# Patient Record
Sex: Male | Born: 1960 | Race: Black or African American | Hispanic: No | Marital: Single | Smoking: Current every day smoker
Health system: Southern US, Community
[De-identification: ages and names within clinical notes are randomized; demographics above are authoritative.]

## PROBLEM LIST (undated history)

## (undated) DIAGNOSIS — Z9889 Other specified postprocedural states: Secondary | ICD-10-CM

## (undated) DIAGNOSIS — I4891 Unspecified atrial fibrillation: Secondary | ICD-10-CM

## (undated) DIAGNOSIS — Z87442 Personal history of urinary calculi: Secondary | ICD-10-CM

## (undated) DIAGNOSIS — D649 Anemia, unspecified: Secondary | ICD-10-CM

## (undated) DIAGNOSIS — G4730 Sleep apnea, unspecified: Secondary | ICD-10-CM

## (undated) DIAGNOSIS — T783XXA Angioneurotic edema, initial encounter: Secondary | ICD-10-CM

## (undated) DIAGNOSIS — Z720 Tobacco use: Secondary | ICD-10-CM

## (undated) DIAGNOSIS — J449 Chronic obstructive pulmonary disease, unspecified: Secondary | ICD-10-CM

## (undated) DIAGNOSIS — M1990 Unspecified osteoarthritis, unspecified site: Secondary | ICD-10-CM

## (undated) DIAGNOSIS — Z9981 Dependence on supplemental oxygen: Secondary | ICD-10-CM

## (undated) DIAGNOSIS — R0600 Dyspnea, unspecified: Secondary | ICD-10-CM

## (undated) DIAGNOSIS — I10 Essential (primary) hypertension: Secondary | ICD-10-CM

## (undated) DIAGNOSIS — I4892 Unspecified atrial flutter: Secondary | ICD-10-CM

## (undated) DIAGNOSIS — I499 Cardiac arrhythmia, unspecified: Secondary | ICD-10-CM

## (undated) DIAGNOSIS — G4733 Obstructive sleep apnea (adult) (pediatric): Secondary | ICD-10-CM

## (undated) DIAGNOSIS — R7303 Prediabetes: Secondary | ICD-10-CM

## (undated) DIAGNOSIS — B182 Chronic viral hepatitis C: Secondary | ICD-10-CM

## (undated) DIAGNOSIS — I5032 Chronic diastolic (congestive) heart failure: Secondary | ICD-10-CM

## (undated) HISTORY — DX: Tobacco use: Z72.0

## (undated) HISTORY — DX: Sleep apnea, unspecified: G47.30

## (undated) HISTORY — DX: Chronic diastolic (congestive) heart failure: I50.32

## (undated) HISTORY — DX: Obstructive sleep apnea (adult) (pediatric): G47.33

## (undated) HISTORY — DX: Unspecified atrial fibrillation: I48.91

## (undated) HISTORY — PX: NO PAST SURGERIES: SHX2092

## (undated) HISTORY — DX: Other specified postprocedural states: Z98.890

## (undated) HISTORY — DX: Angioneurotic edema, initial encounter: T78.3XXA

## (undated) HISTORY — DX: Chronic viral hepatitis C: B18.2

## (undated) HISTORY — DX: Unspecified atrial flutter: I48.92

---

## 1997-08-23 ENCOUNTER — Emergency Department (HOSPITAL_COMMUNITY): Admission: EM | Admit: 1997-08-23 | Discharge: 1997-08-23 | Payer: Self-pay | Admitting: *Deleted

## 2009-10-09 ENCOUNTER — Emergency Department (HOSPITAL_COMMUNITY): Admission: EM | Admit: 2009-10-09 | Discharge: 2009-10-09 | Payer: Self-pay | Admitting: Emergency Medicine

## 2011-09-22 ENCOUNTER — Emergency Department (HOSPITAL_COMMUNITY)
Admission: EM | Admit: 2011-09-22 | Discharge: 2011-09-22 | Disposition: A | Discharge status: Home / Self Care | Payer: Self-pay | Attending: Emergency Medicine | Admitting: Emergency Medicine

## 2011-09-22 ENCOUNTER — Encounter (HOSPITAL_COMMUNITY): Payer: Self-pay | Admitting: *Deleted

## 2011-09-22 DIAGNOSIS — L02419 Cutaneous abscess of limb, unspecified: Secondary | ICD-10-CM

## 2011-09-22 DIAGNOSIS — L03119 Cellulitis of unspecified part of limb: Secondary | ICD-10-CM

## 2011-09-22 DIAGNOSIS — IMO0002 Reserved for concepts with insufficient information to code with codable children: Secondary | ICD-10-CM | POA: Insufficient documentation

## 2011-09-22 LAB — CBC WITH DIFFERENTIAL/PLATELET
Basophils Absolute: 0 10*3/uL (ref 0.0–0.1)
Basophils Relative: 0 % (ref 0–1)
Eosinophils Absolute: 0.1 10*3/uL (ref 0.0–0.7)
Eosinophils Relative: 1 % (ref 0–5)
HCT: 36.7 % — ABNORMAL LOW (ref 39.0–52.0)
Hemoglobin: 12.6 g/dL — ABNORMAL LOW (ref 13.0–17.0)
Lymphocytes Relative: 30 % (ref 12–46)
Lymphs Abs: 1.6 10*3/uL (ref 0.7–4.0)
MCH: 32.4 pg (ref 26.0–34.0)
MCHC: 34.3 g/dL (ref 30.0–36.0)
MCV: 94.3 fL (ref 78.0–100.0)
Monocytes Absolute: 0.5 10*3/uL (ref 0.1–1.0)
Monocytes Relative: 10 % (ref 3–12)
Neutro Abs: 3 10*3/uL (ref 1.7–7.7)
Neutrophils Relative %: 58 % (ref 43–77)
Platelets: 138 10*3/uL — ABNORMAL LOW (ref 150–400)
RBC: 3.89 MIL/uL — ABNORMAL LOW (ref 4.22–5.81)
RDW: 12.9 % (ref 11.5–15.5)
WBC: 5.2 10*3/uL (ref 4.0–10.5)

## 2011-09-22 LAB — BASIC METABOLIC PANEL
BUN: 15 mg/dL (ref 6–23)
CO2: 25 mEq/L (ref 19–32)
Calcium: 8.6 mg/dL (ref 8.4–10.5)
Chloride: 102 mEq/L (ref 96–112)
Creatinine, Ser: 0.79 mg/dL (ref 0.50–1.35)
GFR calc Af Amer: >90 mL/min (ref >90)
GFR calc non Af Amer: >90 mL/min (ref >90)
Glucose, Bld: 95 mg/dL (ref 70–99)
Potassium: 3.6 mEq/L (ref 3.5–5.1)
Sodium: 135 mEq/L (ref 135–145)

## 2011-09-22 MED ORDER — CLINDAMYCIN PHOSPHATE 900 MG/50ML IV SOLN
900.0000 mg | Freq: Once | INTRAVENOUS | Status: AC
  Administered 2011-09-22: 900 mg via INTRAVENOUS
  Filled 2011-09-22: qty 50

## 2011-09-22 MED ORDER — CLINDAMYCIN HCL 150 MG PO CAPS: ORAL_CAPSULE | ORAL | Status: DC

## 2011-09-22 NOTE — ED Notes (Signed)
Pt states understanding of discharge instructions 

## 2011-09-22 NOTE — ED Notes (Signed)
Pt reports possible insect bite to right forearm, has increased in size since this am. Redness and warmth noted to forearm.

## 2011-09-22 NOTE — ED Provider Notes (Signed)
Medical screening examination/treatment/procedure(s) were conducted as a shared visit with non-physician practitioner(s) and myself.  I personally evaluated the patient during the encounter   Pt with cellulitis of R forearm from insect bite, no abscess, no fever. No known PMH. IV Abx and reassess.   Naliya Gish B. Bernette Mayers, MD 09/22/11 [phone]

## 2011-09-24 ENCOUNTER — Emergency Department (HOSPITAL_COMMUNITY)
Admission: EM | Admit: 2011-09-24 | Discharge: 2011-09-24 | Disposition: A | Discharge status: Home / Self Care | Payer: Self-pay | Attending: Emergency Medicine | Admitting: Emergency Medicine

## 2011-09-24 DIAGNOSIS — F17200 Nicotine dependence, unspecified, uncomplicated: Secondary | ICD-10-CM | POA: Insufficient documentation

## 2011-09-24 DIAGNOSIS — L02413 Cutaneous abscess of right upper limb: Secondary | ICD-10-CM

## 2011-09-24 DIAGNOSIS — IMO0002 Reserved for concepts with insufficient information to code with codable children: Secondary | ICD-10-CM | POA: Insufficient documentation

## 2011-09-24 MED ORDER — HYDROCODONE-ACETAMINOPHEN 5-325 MG PO TABS: 1.0000 | ORAL_TABLET | ORAL | Status: AC | PRN

## 2011-09-24 NOTE — ED Notes (Signed)
Follow-up on a I and D of a cyst from the rt. Posterior forearm.

## 2011-09-24 NOTE — ED Provider Notes (Signed)
History     CSN: [id number]  Arrival date & time 09/24/11  4098   First MD Initiated Contact with Patient 09/24/11 [phone]      Chief Complaint  Patient presents with  . Wound Check    (Consider location/radiation/quality/duration/timing/severity/associated sxs/prior treatment) Patient is a 51 y.o. male presenting with wound check. The history is provided by the patient.  Wound Check  He was treated in the ED 2 to 3 days ago. Treatments since wound repair include oral antibiotics (Abscess to right arm started on abx 2 days ago but not drained, returns for recheck. ). There has been no drainage from the wound. The redness has worsened. The swelling has not changed. The pain has not changed. He has no difficulty moving the affected extremity or digit.    No past medical history on file.  No past surgical history on file.  No family history on file.  History  Substance Use Topics  . Smoking status: Current Everyday Smoker    Types: Cigarettes  . Smokeless tobacco: Not on file  . Alcohol Use: Yes     occ      Review of Systems  Constitutional: Negative for fever.  Musculoskeletal: Negative for myalgias and joint swelling.  Skin:       See HPI.    Allergies  Review of patient's allergies indicates no known allergies.  Home Medications   Current Outpatient Rx  Name Route Sig Dispense Refill  . CLINDAMYCIN HCL 150 MG PO CAPS Oral Take 450 mg by mouth 3 (three) times daily. 10 days supply given  for infection      BP 152/80  Pulse 92  Resp 20  SpO2 100%  Physical Exam  Constitutional: He is oriented to person, place, and time. He appears well-developed and well-nourished. No distress.  Musculoskeletal: Normal range of motion.  Neurological: He is alert and oriented to person, place, and time.  Skin:       4cm x 3 cm red area to posterior proximal forearm with central blister that has central necrosis, c/w abscess. No active drainage.     ED Course  Procedures  (including critical care time)  Labs Reviewed - No data to display No results found.  INCISION AND DRAINAGE Performed by: Langley Adie A Consent: Verbal consent obtained. Risks and benefits: risks, benefits and alternatives were discussed Type: abscess  Body area: right prox. forearm  Anesthesia: local infiltration  Local anesthetic: lidocaine 1% w/o epinephrine  Anesthetic total: 1 ml  Complexity: complex Blunt dissection to break up loculations  Drainage: purulent  Drainage amount: small  Packing material: 1/4 in iodoform gauze  Patient tolerance: Patient tolerated the procedure well with no immediate complications.   No diagnosis found.  1. Abscess   MDM  Abscess previously evaluated 2 days ago but not drained on abx with little improvement. I&D performed with minimal drainage. Wound packed. He will continue abx and is given pain med Rx. Return 2 days for recheck.        Rodena Medin, PA-C 09/24/11 1135

## 2011-09-25 NOTE — ED Provider Notes (Signed)
Medical screening examination/treatment/procedure(s) were performed by non-physician practitioner and as supervising physician I was immediately available for consultation/collaboration.   Gerhard Munch, MD 09/25/11 2350

## 2011-10-16 NOTE — ED Provider Notes (Signed)
History     CSN: [id number]  Arrival date & time 09/22/11  1351   First MD Initiated Contact with Patient 09/22/11 1641      Chief Complaint  Patient presents with  . Abscess    (Consider location/radiation/quality/duration/timing/severity/associated sxs/prior treatment) HPI Patient presents to the ER with redness and mild swelling to the lateral forearm. The patient denies n/v, weakness, fever, or lethargy. The patient was concerned that this was an insect bite. The patient denies any treatment prior to arrival.  History reviewed. No pertinent past medical history.  History reviewed. No pertinent past surgical history.  History reviewed. No pertinent family history.  History  Substance Use Topics  . Smoking status: Current Everyday Smoker    Types: Cigarettes  . Smokeless tobacco: Not on file  . Alcohol Use: Yes     occ      Review of Systems All other systems negative except as documented in the HPI. All pertinent positives and negatives as reviewed in the HPI.  Allergies  Review of patient's allergies indicates no known allergies.  Home Medications   Current Outpatient Rx  Name Route Sig Dispense Refill  . CLINDAMYCIN HCL 150 MG PO CAPS Oral Take 450 mg by mouth 3 (three) times daily. 10 days supply given  for infection      BP 154/84  Pulse 75  Temp 99 F (37.2 C) (Oral)  Resp 20  SpO2 98%  Physical Exam  Nursing note and vitals reviewed. Constitutional: He appears well-developed and well-nourished.  Cardiovascular: Normal rate, regular rhythm and normal heart sounds.   Pulmonary/Chest: Effort normal and breath sounds normal.  Musculoskeletal:       Patient has normal ROM of the wrist and hand with not tenderness on ROM  Skin:       ED Course  Procedures (including critical care time)  Labs Reviewed  CBC WITH DIFFERENTIAL - Abnormal; Notable for the following:    RBC 3.89 (*)     Hemoglobin 12.6 (*)     HCT 36.7 (*)     Platelets 138 (*)       All other components within normal limits  BASIC METABOLIC PANEL  LAB REPORT - SCANNED   No results found.   1. Abscess of forearm   2. Cellulitis And Abscess Of Forearm    Patient given antibiotics and advised to return here in 2 days for a recheck. The MD saw the patient as well. There appears to be no tendon involvement at this time.    MDM          Carlyle Dolly, PA-C 10/16/11 1029

## 2013-12-10 ENCOUNTER — Emergency Department (HOSPITAL_COMMUNITY)
Admission: EM | Admit: 2013-12-10 | Discharge: 2013-12-10 | Disposition: A | Discharge status: Home / Self Care | Payer: Self-pay | Attending: Emergency Medicine | Admitting: Emergency Medicine

## 2013-12-10 ENCOUNTER — Encounter (HOSPITAL_COMMUNITY): Payer: Self-pay | Admitting: Emergency Medicine

## 2013-12-10 DIAGNOSIS — S0993XA Unspecified injury of face, initial encounter: Secondary | ICD-10-CM | POA: Insufficient documentation

## 2013-12-10 DIAGNOSIS — IMO0002 Reserved for concepts with insufficient information to code with codable children: Secondary | ICD-10-CM | POA: Insufficient documentation

## 2013-12-10 DIAGNOSIS — Y92410 Unspecified street and highway as the place of occurrence of the external cause: Secondary | ICD-10-CM | POA: Insufficient documentation

## 2013-12-10 DIAGNOSIS — Z792 Long term (current) use of antibiotics: Secondary | ICD-10-CM | POA: Insufficient documentation

## 2013-12-10 DIAGNOSIS — S298XXA Other specified injuries of thorax, initial encounter: Secondary | ICD-10-CM | POA: Insufficient documentation

## 2013-12-10 DIAGNOSIS — S199XXA Unspecified injury of neck, initial encounter: Secondary | ICD-10-CM

## 2013-12-10 DIAGNOSIS — Y9389 Activity, other specified: Secondary | ICD-10-CM | POA: Insufficient documentation

## 2013-12-10 DIAGNOSIS — F17200 Nicotine dependence, unspecified, uncomplicated: Secondary | ICD-10-CM | POA: Insufficient documentation

## 2013-12-10 MED ORDER — HYDROCODONE-ACETAMINOPHEN 5-325 MG PO TABS: 2.0000 | ORAL_TABLET | Freq: Four times a day (QID) | ORAL | Status: DC | PRN

## 2013-12-10 MED ORDER — IBUPROFEN 800 MG PO TABS: 800.0000 mg | ORAL_TABLET | Freq: Three times a day (TID) | ORAL | Status: DC

## 2013-12-10 NOTE — Discharge Instructions (Signed)
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
## 2013-12-10 NOTE — ED Provider Notes (Signed)
Medical screening examination/treatment/procedure(s) were performed by non-physician practitioner and as supervising physician I was immediately available for consultation/collaboration.  [name], MD 12/10/13 2325

## 2014-05-14 ENCOUNTER — Emergency Department (HOSPITAL_COMMUNITY)
Admission: EM | Admit: 2014-05-14 | Discharge: 2014-05-14 | Disposition: A | Discharge status: Home / Self Care | Payer: Self-pay | Attending: Emergency Medicine | Admitting: Emergency Medicine

## 2014-05-14 ENCOUNTER — Encounter (HOSPITAL_COMMUNITY): Payer: Self-pay | Admitting: Cardiology

## 2014-05-14 ENCOUNTER — Emergency Department (HOSPITAL_COMMUNITY): Payer: Self-pay

## 2014-05-14 DIAGNOSIS — S62660A Nondisplaced fracture of distal phalanx of right index finger, initial encounter for closed fracture: Secondary | ICD-10-CM | POA: Insufficient documentation

## 2014-05-14 DIAGNOSIS — Z791 Long term (current) use of non-steroidal anti-inflammatories (NSAID): Secondary | ICD-10-CM | POA: Insufficient documentation

## 2014-05-14 DIAGNOSIS — Z720 Tobacco use: Secondary | ICD-10-CM | POA: Insufficient documentation

## 2014-05-14 DIAGNOSIS — Y92488 Other paved roadways as the place of occurrence of the external cause: Secondary | ICD-10-CM | POA: Insufficient documentation

## 2014-05-14 DIAGNOSIS — Y998 Other external cause status: Secondary | ICD-10-CM | POA: Insufficient documentation

## 2014-05-14 DIAGNOSIS — W228XXA Striking against or struck by other objects, initial encounter: Secondary | ICD-10-CM | POA: Insufficient documentation

## 2014-05-14 DIAGNOSIS — Y9389 Activity, other specified: Secondary | ICD-10-CM | POA: Insufficient documentation

## 2014-05-14 DIAGNOSIS — S62609A Fracture of unspecified phalanx of unspecified finger, initial encounter for closed fracture: Secondary | ICD-10-CM

## 2014-05-14 MED ORDER — HYDROCODONE-ACETAMINOPHEN 5-325 MG PO TABS: 1.0000 | ORAL_TABLET | ORAL | Status: DC | PRN

## 2014-05-14 MED ORDER — NAPROXEN 375 MG PO TABS: 375.0000 mg | ORAL_TABLET | Freq: Two times a day (BID) | ORAL | Status: DC

## 2014-05-14 MED ORDER — TRAMADOL HCL 50 MG PO TABS
50.0000 mg | ORAL_TABLET | Freq: Once | ORAL | Status: AC
  Administered 2014-05-14: 50 mg via ORAL
  Filled 2014-05-14: qty 1

## 2014-05-14 NOTE — Progress Notes (Signed)
Orthopedic Tech Progress Note Patient Details:  Frank Morales 10-Feb-1961 [id number] Applied static aluminum/foam finger splint to Rt. 2nd finger.  Motion, sensation intact before and after splinting.  Capillary refill less than 2 seconds before and after splinting. Ortho Devices Type of Ortho Device: Finger splint Ortho Device/Splint Location: Rt. 2nd finger Ortho Device/Splint Interventions: Application   Darrol Poke 05/14/2014, 9:00 PM

## 2014-05-14 NOTE — ED Notes (Signed)
Spoke with Ortho tech.  To come and place splint.
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
## 2014-05-14 NOTE — Discharge Instructions (Signed)

## 2015-08-17 ENCOUNTER — Encounter (HOSPITAL_COMMUNITY): Payer: Self-pay | Admitting: Internal Medicine

## 2015-08-17 ENCOUNTER — Encounter: Payer: Self-pay | Admitting: Internal Medicine

## 2015-08-17 ENCOUNTER — Ambulatory Visit (INDEPENDENT_AMBULATORY_CARE_PROVIDER_SITE_OTHER): Payer: Self-pay | Admitting: Internal Medicine

## 2015-08-17 ENCOUNTER — Observation Stay (HOSPITAL_COMMUNITY): Payer: Self-pay

## 2015-08-17 ENCOUNTER — Ambulatory Visit (HOSPITAL_COMMUNITY)
Admission: RE | Admit: 2015-08-17 | Discharge: 2015-08-17 | Disposition: A | Discharge status: Home / Self Care | Payer: Self-pay | Source: Ambulatory Visit | Attending: Internal Medicine | Admitting: Internal Medicine

## 2015-08-17 ENCOUNTER — Inpatient Hospital Stay (HOSPITAL_COMMUNITY)
Admission: AD | Admit: 2015-08-17 | Discharge: 2015-08-22 | DRG: 286 | Disposition: A | Discharge status: Home / Self Care | Payer: Self-pay | Source: Ambulatory Visit | Attending: Oncology | Admitting: Oncology

## 2015-08-17 DIAGNOSIS — I4892 Unspecified atrial flutter: Secondary | ICD-10-CM

## 2015-08-17 DIAGNOSIS — I119 Hypertensive heart disease without heart failure: Secondary | ICD-10-CM | POA: Diagnosis present

## 2015-08-17 DIAGNOSIS — F17200 Nicotine dependence, unspecified, uncomplicated: Secondary | ICD-10-CM | POA: Diagnosis present

## 2015-08-17 DIAGNOSIS — F1410 Cocaine abuse, uncomplicated: Secondary | ICD-10-CM | POA: Diagnosis present

## 2015-08-17 DIAGNOSIS — R0601 Orthopnea: Secondary | ICD-10-CM | POA: Diagnosis present

## 2015-08-17 DIAGNOSIS — E875 Hyperkalemia: Secondary | ICD-10-CM | POA: Diagnosis not present

## 2015-08-17 DIAGNOSIS — I2510 Atherosclerotic heart disease of native coronary artery without angina pectoris: Secondary | ICD-10-CM | POA: Diagnosis present

## 2015-08-17 DIAGNOSIS — I493 Ventricular premature depolarization: Secondary | ICD-10-CM

## 2015-08-17 DIAGNOSIS — I4891 Unspecified atrial fibrillation: Secondary | ICD-10-CM | POA: Insufficient documentation

## 2015-08-17 DIAGNOSIS — R0602 Shortness of breath: Secondary | ICD-10-CM

## 2015-08-17 DIAGNOSIS — I484 Atypical atrial flutter: Secondary | ICD-10-CM

## 2015-08-17 DIAGNOSIS — F17210 Nicotine dependence, cigarettes, uncomplicated: Secondary | ICD-10-CM

## 2015-08-17 DIAGNOSIS — I429 Cardiomyopathy, unspecified: Secondary | ICD-10-CM | POA: Diagnosis present

## 2015-08-17 DIAGNOSIS — I483 Typical atrial flutter: Principal | ICD-10-CM | POA: Diagnosis present

## 2015-08-17 DIAGNOSIS — B182 Chronic viral hepatitis C: Secondary | ICD-10-CM | POA: Diagnosis present

## 2015-08-17 DIAGNOSIS — E871 Hypo-osmolality and hyponatremia: Secondary | ICD-10-CM | POA: Diagnosis present

## 2015-08-17 DIAGNOSIS — I10 Essential (primary) hypertension: Secondary | ICD-10-CM

## 2015-08-17 DIAGNOSIS — B1710 Acute hepatitis C without hepatic coma: Secondary | ICD-10-CM

## 2015-08-17 DIAGNOSIS — E662 Morbid (severe) obesity with alveolar hypoventilation: Secondary | ICD-10-CM | POA: Diagnosis present

## 2015-08-17 DIAGNOSIS — Z6841 Body Mass Index (BMI) 40.0 and over, adult: Secondary | ICD-10-CM

## 2015-08-17 DIAGNOSIS — I071 Rheumatic tricuspid insufficiency: Secondary | ICD-10-CM | POA: Diagnosis present

## 2015-08-17 DIAGNOSIS — R600 Localized edema: Secondary | ICD-10-CM

## 2015-08-17 DIAGNOSIS — I5033 Acute on chronic diastolic (congestive) heart failure: Secondary | ICD-10-CM | POA: Diagnosis present

## 2015-08-17 DIAGNOSIS — R05 Cough: Secondary | ICD-10-CM

## 2015-08-17 DIAGNOSIS — R079 Chest pain, unspecified: Secondary | ICD-10-CM | POA: Diagnosis present

## 2015-08-17 DIAGNOSIS — R6889 Other general symptoms and signs: Secondary | ICD-10-CM

## 2015-08-17 DIAGNOSIS — B1920 Unspecified viral hepatitis C without hepatic coma: Secondary | ICD-10-CM | POA: Diagnosis present

## 2015-08-17 DIAGNOSIS — I272 Other secondary pulmonary hypertension: Secondary | ICD-10-CM | POA: Diagnosis present

## 2015-08-17 DIAGNOSIS — I110 Hypertensive heart disease with heart failure: Secondary | ICD-10-CM | POA: Diagnosis present

## 2015-08-17 DIAGNOSIS — R0600 Dyspnea, unspecified: Secondary | ICD-10-CM

## 2015-08-17 HISTORY — DX: Unspecified atrial fibrillation: I48.91

## 2015-08-17 HISTORY — DX: Unspecified atrial flutter: I48.92

## 2015-08-17 LAB — LIPID PANEL
Cholesterol: 105 mg/dL (ref 0–200)
HDL: 28 mg/dL — ABNORMAL LOW (ref >40)
LDL CALC: 41 mg/dL (ref 0–99)
TRIGLYCERIDES: 182 mg/dL — AB (ref <150)
Total CHOL/HDL Ratio: 3.8 RATIO
VLDL: 36 mg/dL (ref 0–40)

## 2015-08-17 LAB — RAPID URINE DRUG SCREEN, HOSP PERFORMED
Amphetamines: NOT DETECTED
BARBITURATES: NOT DETECTED
Benzodiazepines: NOT DETECTED
COCAINE: POSITIVE — AB
Opiates: NOT DETECTED
Tetrahydrocannabinol: NOT DETECTED

## 2015-08-17 LAB — CBC
HEMATOCRIT: 43 % (ref 39.0–52.0)
HEMOGLOBIN: 14 g/dL (ref 13.0–17.0)
MCH: 30.6 pg (ref 26.0–34.0)
MCHC: 32.6 g/dL (ref 30.0–36.0)
MCV: 93.9 fL (ref 78.0–100.0)
Platelets: 97 10*3/uL — ABNORMAL LOW (ref 150–400)
RBC: 4.58 MIL/uL (ref 4.22–5.81)
RDW: 13.9 % (ref 11.5–15.5)
WBC: 3.3 10*3/uL — ABNORMAL LOW (ref 4.0–10.5)

## 2015-08-17 LAB — GLUCOSE, CAPILLARY: GLUCOSE-CAPILLARY: 88 mg/dL (ref 65–99)

## 2015-08-17 LAB — COMPREHENSIVE METABOLIC PANEL
ALBUMIN: 3.1 g/dL — AB (ref 3.5–5.0)
ALK PHOS: 54 U/L (ref 38–126)
ALT: 65 U/L — ABNORMAL HIGH (ref 17–63)
ANION GAP: 5 (ref 5–15)
AST: 63 U/L — AB (ref 15–41)
BILIRUBIN TOTAL: 0.6 mg/dL (ref 0.3–1.2)
BUN: 10 mg/dL (ref 6–20)
CALCIUM: 9.1 mg/dL (ref 8.9–10.3)
CO2: 29 mmol/L (ref 22–32)
Chloride: 103 mmol/L (ref 101–111)
Creatinine, Ser: 0.7 mg/dL (ref 0.61–1.24)
GFR calc Af Amer: >60 mL/min (ref >60)
GFR calc non Af Amer: >60 mL/min (ref >60)
GLUCOSE: 91 mg/dL (ref 65–99)
POTASSIUM: 4.1 mmol/L (ref 3.5–5.1)
SODIUM: 137 mmol/L (ref 135–145)
TOTAL PROTEIN: 6.8 g/dL (ref 6.5–8.1)

## 2015-08-17 LAB — TROPONIN I: TROPONIN I: 0.04 ng/mL — AB (ref <0.031)

## 2015-08-17 LAB — TSH: TSH: 1.713 u[IU]/mL (ref 0.350–4.500)

## 2015-08-17 LAB — POCT GLYCOSYLATED HEMOGLOBIN (HGB A1C): Hemoglobin A1C: 5.4

## 2015-08-17 LAB — MAGNESIUM: Magnesium: 1.9 mg/dL (ref 1.7–2.4)

## 2015-08-17 LAB — BRAIN NATRIURETIC PEPTIDE: B Natriuretic Peptide: 251.8 pg/mL — ABNORMAL HIGH (ref 0.0–100.0)

## 2015-08-17 LAB — PHOSPHORUS: PHOSPHORUS: 4.2 mg/dL (ref 2.5–4.6)

## 2015-08-17 MED ORDER — ACETAMINOPHEN 650 MG RE SUPP: 650.0000 mg | Freq: Four times a day (QID) | RECTAL | Status: DC | PRN

## 2015-08-17 MED ORDER — ACETAMINOPHEN 325 MG PO TABS: 650.0000 mg | ORAL_TABLET | Freq: Four times a day (QID) | ORAL | Status: DC | PRN

## 2015-08-17 MED ORDER — SENNOSIDES-DOCUSATE SODIUM 8.6-50 MG PO TABS: 1.0000 | ORAL_TABLET | Freq: Every evening | ORAL | Status: DC | PRN

## 2015-08-17 MED ORDER — ENOXAPARIN SODIUM 80 MG/0.8ML ~~LOC~~ SOLN
80.0000 mg | SUBCUTANEOUS | Status: DC
  Administered 2015-08-17: 80 mg via SUBCUTANEOUS
  Filled 2015-08-17 (×2): qty 0.8

## 2015-08-17 MED ORDER — FUROSEMIDE 10 MG/ML IJ SOLN
80.0000 mg | Freq: Two times a day (BID) | INTRAMUSCULAR | Status: DC
  Administered 2015-08-17 – 2015-08-20 (×7): 80 mg via INTRAVENOUS
  Filled 2015-08-17 (×7): qty 8

## 2015-08-17 NOTE — Assessment & Plan Note (Signed)
Likely 2/2 to either cardiomyopathy or obesity.  Has venous stasis changes. No signs of DVT on exam. Equal bilaterally.  Asked to keep legs elevated, recommended compression socks. May need lasix as well.

## 2015-08-17 NOTE — Progress Notes (Signed)
Pt's HR increases to the 140's when he gets up out of bed. Pt is asymptomatic with no complaints of CP or distress. HR returns to 80s when he gets back into bed. Will continue to monitor pt closely.

## 2015-08-17 NOTE — Assessment & Plan Note (Addendum)
Filed Vitals:   08/17/15 0835  BP: 162/91  Pulse: 60  Temp: 97.6 F (36.4 C)   BP elevated. Will need to start meds. Checking CMET.  May benefit from diuretic and also ACEI if he has diabetes.

## 2015-08-17 NOTE — Consult Note (Signed)
Patient ID: Frank Morales MRN: [id number], DOB/AGE: [birth year]   Admit date: 08/17/2015   Reason for Consult: CP and SOB Requesting Frank Morales:   Primary Physician: Frank Morales, Provider, Frank Morales Primary Cardiologist: New (Dr. Harrington Morales)  Pt. Profile:  55 y/o morbidly obese male smoker with no significant PMH, and has not been followed routinely by a PCP, admitted by IM for worsening DOE and CP, found to be in atrial flutter.   Problem List  Past Medical History  Diagnosis Date  . Smoker   . Abscess and cellulitis     Past Surgical History  Procedure Laterality Date  . No past surgeries       Allergies  No Known Allergies  HPI  55 y/o morbidly obese male smoker with no significant PMH, and has not been followed routinely by a PCP, admitted by IM for worsening DOE and CP, found to be in atrial flutter.  He reports a 6 month h/o progressive dyspnea on exertion, orthopnea, PND and LEE. He cannot sleep at night. He has to sleep in a chair. Also with ~ 50 lb weight gain over the last 6 months + chronic dry cough. He notes frequent SSCP, both at rest and with exertion. Described as chest tightness. Non radiating. Last episode of chest discomfort was yesterday. Given worsening dyspnea, he presented to the ED for evaluation.    EKG shows ? Atrial flutter with a HR of 101 bpm. Intermittent flutter waves are present on telemetry. BNP is abnormal at 251.8. TSH WNL. K normal at 4.1. Mg pending. CXR pending.   Home Medications  Prior to Admission medications   Not on File    Family History  Family History  Problem Relation Age of Onset  . Hypertension Mother   . Hypertension Maternal Grandfather   . Diabetes Mother     Social History  Social History   Social History  . Marital Status: Married    Spouse Name: N/A  . Number of Children: N/A  . Years of Education: N/A   Occupational History  . unemployed     Previous Training and development officer    Social History Main Topics  . Smoking status: Current  Every Day Smoker -- 0.30 packs/day    Types: Cigarettes  . Smokeless tobacco: Not on file     Comment: 3 -4 cigs/day.  . Alcohol Use: 1.8 oz/week    3 Standard drinks or equivalent per week     Comment: occ beer.  . Drug Use: No  . Sexual Activity: Not on file   Other Topics Concern  . Not on file   Social History Narrative     Review of Systems General:  No chills, fever, night sweats or weight changes.  Cardiovascular:  + chest pain, +dyspnea on exertion, +edema, +orthopnea, - palpitations, +paroxysmal nocturnal dyspnea. Dermatological: No rash, lesions/masses Respiratory: No cough, dyspnea Urologic: No hematuria, dysuria Abdominal:   No nausea, vomiting, diarrhea, bright red blood per rectum, melena, or hematemesis Neurologic:  No visual changes, wkns, changes in mental status. All other systems reviewed and are otherwise negative except as noted above.  Physical Exam  Blood pressure 148/101, pulse 71, temperature 98.1 F (36.7 C), height 5\' 11"  (1.803 m), weight 351 lb 10.1 oz (159.5 kg), SpO2 98 %.  General: Pleasant, NAD, morbidly obese Psych: Normal affect. Neuro: Alert and oriented X 3. Moves all extremities spontaneously. HEENT: Normal  Neck: Supple without bruits or JVD. Lungs:  Resp regular and unlabored, decreased BS bilaterally Heart:  irregular rhythm, regular rate no s3, s4, or murmurs. Abdomen: obese, distended, non-tender, , BS + x 4.  Extremities: No clubbing or cyanosis. DP/PT/Radials 2+ and equal bilaterally.  Labs  Troponin (Point of Care Test) No results for input(s): TROPIPOC in the last 72 hours. No results for input(s): CKTOTAL, CKMB, TROPONINI in the last 72 hours. Lab Results  Component Value Date   WBC 3.3* 08/17/2015   HGB 14.0 08/17/2015   HCT 43.0 08/17/2015   MCV 93.9 08/17/2015   PLT 97* 08/17/2015    Recent Labs Lab 08/17/15 0935  NA 137  K 4.1  CL 103  CO2 29  BUN 10  CREATININE 0.70  CALCIUM 9.1  PROT 6.8  BILITOT 0.6    ALKPHOS 54  ALT 65*  AST 63*  GLUCOSE 91   No results found for: CHOL, HDL, LDLCALC, TRIG No results found for: DDIMER   Radiology/Studies  No results found.  ECG  ? Atrial flutter. CVR   Echocardiogram - pending   ASSESSMENT AND PLAN  Principal Problem:   Atrial flutter (HCC) Active Problems:   Severe obesity (BMI >= 40) (HCC)   Uncontrolled hypertension   SOB (shortness of breath)   Current smoker   Chest pain   1. Presumed CHF: Patient with 6 month h/o progressive exertional dyspnea, PND, orthopnea, LEE, weight gain and dry cough. Also with abnormal BNP at 251 and evidence of volume overload with bilateral LEE. CXR is pending. Renal function and K are both normal. Recommend IV lasix for diuresis. Recommend 40 mg BID. Strict I/Os and daily weights. Daily BMPs to monitor renal function and K. Check 2D echo to asess LV systolic and diastolic function, wall motion and valve anatomy. BP is elevated.  Further work up based on echo findings    2. Atrial Flutter: flutter waves present on telemetry.  Frank Morales is well controlled. K and TSH are both normal. Mg pending. 2D echo pending. Recommend ischemic w/u to r/o CAD given new CHF, recent CP and atrial arrhythmia. Recommend IV heparin for a/c, for now, until decision can be reached regarding need for long term oral anticoagulation. Would hold off on oral anticoagulation until we known if he will need a LHC.   3. CP: patient notes h/o SSCP/ tightness occuring at rest and with exertion. Last episode of CP was yesterday. Cycle troponins and check 2D echo. Will plan for either NST vs LHC based on results of echocardiogram and cardiac enzymes.   4. HTN: poorly controlled. Given HF. Low sodium diet.   5. Obesity: given body habitus and atrial arrhthymias, there is concern for possible OSA. Recommend a sleep study, however this can be done as an OP.   6. Tobacco Abuse: smoking cessation advised.   7. Additional Screening: based on  other risk factors and lack of preventive care over the recent years, recommend screening for DM.    Signed, Frank Jester, Frank Morales 08/17/2015, 1:07 PM   Pt seen and examined  Agree with findings as noted above by Frank Morales Pt is a morbidly obese 55 yo who presents with progressive dyspnea  Wt gain EKG with atrial flutter   On exam  Neck full  Lungs rel clear  Cardiac exam:  Irreg irreg  No S3  Abd Distended  Ext  1+ edema Plan to diurese with IV lasix  Strict I/O   Echo to evaluate LV functon  Dorris Carnes

## 2015-08-17 NOTE — Assessment & Plan Note (Addendum)
Has orthopnea, cough worse at night time, b/l leg swelling. This is likely 2/2 to volume overload. Normal lung exam.  Concerning for cardiomyopathy causing SOB. EKG obtained showed Aflutter which could be a cause as well.  Will admit him for further workup. Will need CMET, CBC, BNP, CXR and ECHO at least.

## 2015-08-17 NOTE — Progress Notes (Signed)
Pharmacy: Lovenox Dose Adjustment for VTE Prophylaxis  OBJECTIVE:  Wt: 160.2 kg, Ht: 71 inches, BMI~49 SCr 0.7, CrCl>100 ml/min  ASSESSMENT:  25 YOM with obesity requiring a lovenox dose adjustment for VTE prophylaxis for BMI>30 and CrCl>30 ml/min.   PLAN:  1. Adjust Lovenox to 80 mg SQ every 24 hours 2. Pharmacy will monitor peripherally for bleeding and any other necessary dose adjustments  Alycia Rossetti, PharmD, BCPS Clinical Pharmacist Pager: [phone] 08/17/2015 10:26 AM

## 2015-08-17 NOTE — Progress Notes (Signed)
   Subjective:    Patient ID: Frank Morales, male    DOB: 03-21-1960, 55 y.o.   MRN: [id number]  HPI  1 year of b/l leg swelling, had some blisters on both legs, has SOB with lying flat, sleeps the best on kitchen table. Has some muscular type chest pain with turning of his torso occasionally that lasts few seconds before going away. Has coughing, worst at lying flat, mostly non productive. Can't walk up hill or long distance. 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   Increased thirst and frequent nocturia.   PMH: none PSxH: none Family hx: diabetes in mother, HTN in mother and maternal grand parent.  SH: used to work as a Training and development officer. Smokes 3-4 cigs a day, 3-4 cans of beer per week, no drug use.   Review of Systems  Constitutional: Negative for fever, chills and fatigue.  HENT: Positive for congestion. Negative for sore throat and trouble swallowing.   Eyes: Negative for photophobia and visual disturbance.  Respiratory: Positive for cough, chest tightness and shortness of breath. Negative for choking and wheezing.   Cardiovascular: Positive for leg swelling. Negative for chest pain and palpitations.  Gastrointestinal: Negative for nausea, vomiting, abdominal pain, diarrhea and blood in stool.  Endocrine: Positive for polydipsia and polyuria.  Genitourinary: Negative for dysuria and flank pain.  Musculoskeletal: Negative for back pain and arthralgias.  Skin: Negative for rash and wound.  Neurological: Negative for dizziness, light-headedness and headaches.  Psychiatric/Behavioral: Negative for behavioral problems and agitation.       Objective:   Physical Exam  Constitutional: He is oriented to person, place, and time. He appears well-developed and well-nourished.  Obese male, pleasant   HENT:  Head: Normocephalic and atraumatic.  Eyes: Conjunctivae are normal. Pupils are equal, round, and reactive to light.  Neck: Normal range of motion.  No JVD present.  Cardiovascular: Normal rate.   Irregular rhythm. No m/r/g   Pulmonary/Chest: Breath sounds normal. He is in respiratory distress. He has no wheezes.  Abdominal: Soft. Bowel sounds are normal. He exhibits no distension. There is no tenderness.  obse  Musculoskeletal:  2+ pitting edema upto knees bilaterally. Has chronic venous statis changes.   Neurological: He is alert and oriented to person, place, and time. No cranial nerve deficit. Coordination normal.  Skin: Skin is warm.  Psychiatric: He has a normal mood and affect. His behavior is normal.          Assessment & Plan:  See problem based a&p.

## 2015-08-17 NOTE — H&P (Signed)
Date: 08/17/2015               Patient Name:  Frank Morales MRN: [id number]  DOB: 1961/02/15 Age / Sex: 55 y.o., male   PCP: Provider Default, MD           Medical Service: Internal Medicine Teaching Service         Attending Physician: Dr. Annia Belt, MD    First Contact: Dr. Zada Finders, MD Pager: [phone]  Second Contact: Dr. Charlott Rakes, MD Pager: [phone]       After Hours (After 5p/  First Contact Pager: [phone]  weekends / holidays): Second Contact Pager: [phone]    Most Recent Discharge Date:  05/14/14  Chief Complaint:      History of Present Illness:  Frank Morales is a 55 y.o. male who presents to the [facility] for worsening dyspnea.  He reports that he has been short of breath for at least the last 6 months if not longer.  He is unable to walk more than 1 block without getting fatigued and short of breath.  Also reports dyspnea and cough when lying flat and has been sleeping in a chair.  States he also gets "choked" at night.  He endorses occasional sharp right sided chest pain that mainly occurs with exertion.  Denies palpitations but does endorse some lightheadedness and bilateral lower extremity swelling.  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  He has not been to see a physician since the 1990s.  He smokes about 1 pack per week since high school and drinks about a 6 pack of beer per week.  Denies any recreational drug use or OTC medications except for the occasional cough syrup.  He denies any FH of heart problems.  Of note, he does report a syncopal episode about 3-4 years ago.    In the [facility], EKG showed atrial flutter with a HR of 102.  He was asymptomatic at the time.  BP of 148/101, O2 sat 98% RA, and wt of 353 lbs (BMI 49).    Meds: Current Facility-Administered Medications  Medication Dose Route Frequency Provider Last Rate Last Dose  . acetaminophen (TYLENOL) tablet 650 mg  650 mg Oral Q6H  PRN Jones Bales, MD       Or  . acetaminophen (TYLENOL) suppository 650 mg  650 mg Rectal Q6H PRN Jones Bales, MD      . enoxaparin (LOVENOX) injection 80 mg  80 mg Subcutaneous Q24H Jones Bales, MD      . senna-docusate (Senokot-S) tablet 1 tablet  1 tablet Oral QHS PRN Jones Bales, MD        No prescriptions prior to admission    Allergies: Allergies as of 08/17/2015  . (No Known Allergies)    PMH: Past Medical History  Diagnosis Date  . Smoker   . Abscess and cellulitis     PSH: Past Surgical History  Procedure Laterality Date  . No past surgeries      FH: Family History  Problem Relation Age of Onset  . Hypertension Mother   . Hypertension Maternal Grandfather   . Diabetes Mother     SH: Social History  Substance Use Topics  . Smoking status: Current Every Day Smoker -- 0.30 packs/day    Types: Cigarettes  . Smokeless tobacco: Not on file     Comment: 3 -  4 cigs/day.  . Alcohol Use: 1.8 oz/week    3 Standard drinks or equivalent per week     Comment: occ beer.    Review of Systems: Pertinent items are noted in HPI.  All other systems reviewed and are negative.  Physical Exam: BP 148/101 mmHg  Pulse 71  Temp(Src) 98.1 F (36.7 C)  Ht 5\' 11"  (1.803 m)  Wt 351 lb 10.1 oz (159.5 kg)  BMI 49.06 kg/m2  SpO2 98%  Physical Exam Constitutional: Vital signs reviewed.  Patient is well-developed and well-nourished in no acute distress.   Head: Normocephalic and atraumatic Eyes: EOMI, conjunctivae normal, no scleral icterus.  Neck: Supple, difficult to appreciate JVD due to body habitus.   Cardiovascular: Irregular rhythm, distant heart sounds.  Pulmonary/Chest: Normal respiratory effort, decreased breath sounds, did not appreciate any adventitious sounds.   Abdominal: Obese, Soft, NT/ND, bowel sounds are normal Extremities: Large legs with 1+ pitting edema to the knees b/l.  Few blisters noted.   Neurological: A&O x3, cranial nerve  II-XII are grossly intact, no focal motor deficit  Skin: Warm, dry and intact. No rash.  Lab results:  Basic Metabolic Panel:  Recent Labs  08/17/15 0935  NA 137  K 4.1  CL 103  CO2 29  GLUCOSE 91  BUN 10  CREATININE 0.70  CALCIUM 9.1    Calcium/Magnesium/Phosphorus:  Recent Labs Lab 08/17/15 0935  CALCIUM 9.1    Liver Function Tests:  Recent Labs  08/17/15 0935  AST 63*  ALT 65*  ALKPHOS 54  BILITOT 0.6  PROT 6.8  ALBUMIN 3.1*   No results for input(s): LIPASE, AMYLASE in the last 72 hours. No results for input(s): AMMONIA in the last 72 hours.  CBC: Lab Results  Component Value Date   WBC 3.3* 08/17/2015   HGB 14.0 08/17/2015   HCT 43.0 08/17/2015   MCV 93.9 08/17/2015   PLT 97* 08/17/2015    BNP: No results for input(s): PROBNP in the last 72 hours.  CBG:  Recent Labs  08/17/15 0946  GLUCAP 88    Hemoglobin A1C:  Recent Labs  08/17/15 0954  HGBA1C 5.4    Lipid Panel: No results for input(s): CHOL, HDL, LDLCALC, TRIG, CHOLHDL, LDLDIRECT in the last 72 hours.  Thyroid Function Tests:  Recent Labs  08/17/15 0935  TSH 1.713    Drugs of Abuse:  No results found for: LABOPIA, COCAINSCRNUR, LABBENZ, AMPHETMU, THCU, LABBARB   Urinalysis: No results found for: COLORURINE, APPEARANCEUR, LABSPEC, PHURINE, GLUCOSEU, HGBUR, BILIRUBINUR, KETONESUR, PROTEINUR, UROBILINOGEN, NITRITE, LEUKOCYTESUR  Imaging results:  No results found.  Consults: Cardiology Treatment Team:  Rounding Lbcardiology, MD  Assessment & Plan by Problem: Principal Problem:   Atrial flutter (HCC) Active Problems:   Severe obesity (BMI >= 40) (HCC)   Uncontrolled hypertension   SOB (shortness of breath)   Current smoker   Chest pain   Atrial Flutter Pt presented to [facility] with dyspnea found to be in atrial flutter on EKG with HR of 102.  He is hypertensive, a smoker, and is morbidly obese.  CHADS2VASC:1, HASBLED: 0.   -telemetry -goal HR<110 -check  TSH, CMP, Mg, Phos, cbc/diff, UDS. BNP -obtain CXR, echo -will need sleep study as an outpatient ' -daily weights/I&Os -will consult cards  Chest Pain Currently asymptomatic, but he occasionally has a sharp right sided chest pain mainly with exertion.  He is high risk with uncontrolled hypertension, obesity, and a smoker.  No FH of CAD. -obtain CXR, echo -consult cards  Uncontrolled Hypertension  BP 148/101 in [facility].  He has never been on any medications.  HA1c 5.4.   -check lipid panel, UDS  -will need to start on antihypertensive medications   Morbidly Obese BMI 49.  States he has been obese his entire life.  Denies that he snores, however, reports that he wakes up choking.  Also endorses daytime sleepiness.   -encourage weight loss -will need sleep study as outpatient   FEN  Fluids- None Electrolytes- Replete as needed Nutrition- Carb modified/Heart healthy  VTE prophylaxis  lovenox SQ qd, dose adjusted for weight   Disposition Disposition deferred at this time, awaiting improvement of current medical problems. Anticipated discharge in approximately 1-2 day(s).   -consult care management (may need assistance with affording medications given lack of insurance) -consult social work   Emergency Energy manager Information    Name Relation Home Work Mobile   Combo,Alphonzo Uncle [id number]        The patient does have a current PCP [facility]) and does need an [facility] hospital follow-up appointment after discharge.  Signed Jones Bales, MD Internal Medicine Teaching Service, PGY-3 08/17/2015, 11:43 AM

## 2015-08-17 NOTE — Progress Notes (Signed)
Internal Medicine Clinic Attending  Case discussed with Dr. Ahmed at the time of the visit.  We reviewed the resident's history and exam and pertinent patient test results.  I agree with the assessment, diagnosis, and plan of care documented in the resident's note. 

## 2015-08-17 NOTE — Assessment & Plan Note (Signed)
Struggling to lose weight because of easily getting fatigued with exertion.  - instructed to eat balanced diet.  - Will check hgba1c.

## 2015-08-17 NOTE — Assessment & Plan Note (Signed)
He had irregular rhythm on cardiac exam. Obtained EKG which has occasional PVC, along with irregular P waves suspicious for Aflutter. Rate is 100-110's.   His CHADSVASC score would be at least 2 (HTN, and likely has diabetes too given polydipsia and nocturia). Will need to be anticoagulated. Given he has no insurance it will be difficult to do further workup for his AFlutter outpatient including obtaining ECHO and starting medications. -will admit him for observation and further workup.

## 2015-08-18 ENCOUNTER — Observation Stay (HOSPITAL_COMMUNITY): Payer: No Typology Code available for payment source

## 2015-08-18 DIAGNOSIS — I4892 Unspecified atrial flutter: Secondary | ICD-10-CM

## 2015-08-18 DIAGNOSIS — R894 Abnormal immunological findings in specimens from other organs, systems and tissues: Secondary | ICD-10-CM

## 2015-08-18 DIAGNOSIS — F17200 Nicotine dependence, unspecified, uncomplicated: Secondary | ICD-10-CM

## 2015-08-18 DIAGNOSIS — R0600 Dyspnea, unspecified: Secondary | ICD-10-CM

## 2015-08-18 LAB — BASIC METABOLIC PANEL
ANION GAP: 6 (ref 5–15)
BUN: 15 mg/dL (ref 6–20)
CHLORIDE: 101 mmol/L (ref 101–111)
CO2: 31 mmol/L (ref 22–32)
Calcium: 9 mg/dL (ref 8.9–10.3)
Creatinine, Ser: 0.83 mg/dL (ref 0.61–1.24)
GFR calc Af Amer: >60 mL/min (ref >60)
GLUCOSE: 98 mg/dL (ref 65–99)
POTASSIUM: 4 mmol/L (ref 3.5–5.1)
Sodium: 138 mmol/L (ref 135–145)

## 2015-08-18 LAB — HEPATITIS PANEL, ACUTE
HEP B S AG: NEGATIVE
Hep A IgM: NEGATIVE
Hep B C IgM: NEGATIVE

## 2015-08-18 LAB — ECHOCARDIOGRAM COMPLETE
HEIGHTINCHES: 71 in
WEIGHTICAEL: 5492.8 [oz_av]

## 2015-08-18 LAB — HIV ANTIBODY (ROUTINE TESTING W REFLEX): HIV SCREEN 4TH GENERATION: NONREACTIVE

## 2015-08-18 LAB — HEPARIN LEVEL (UNFRACTIONATED)

## 2015-08-18 LAB — TROPONIN I
Troponin I: 0.04 ng/mL — ABNORMAL HIGH (ref <0.031)
Troponin I: 0.04 ng/mL — ABNORMAL HIGH (ref <0.031)

## 2015-08-18 LAB — MAGNESIUM: Magnesium: 1.9 mg/dL (ref 1.7–2.4)

## 2015-08-18 MED ORDER — HEPARIN (PORCINE) IN NACL 100-0.45 UNIT/ML-% IJ SOLN
1550.0000 [IU]/h | INTRAMUSCULAR | Status: DC
  Administered 2015-08-18: 1550 [IU]/h via INTRAVENOUS
  Filled 2015-08-18: qty 250

## 2015-08-18 MED ORDER — HEPARIN (PORCINE) IN NACL 100-0.45 UNIT/ML-% IJ SOLN
2700.0000 [IU]/h | INTRAMUSCULAR | Status: DC
  Administered 2015-08-19: 1850 [IU]/h via INTRAVENOUS
  Administered 2015-08-19: 2300 [IU]/h via INTRAVENOUS
  Administered 2015-08-19: 2400 [IU]/h via INTRAVENOUS
  Administered 2015-08-20 – 2015-08-21 (×3): 2700 [IU]/h via INTRAVENOUS
  Filled 2015-08-18 (×7): qty 250

## 2015-08-18 MED ORDER — HEPARIN BOLUS VIA INFUSION
4000.0000 [IU] | Freq: Once | INTRAVENOUS | Status: AC
  Administered 2015-08-18: 4000 [IU] via INTRAVENOUS
  Filled 2015-08-18: qty 4000

## 2015-08-18 MED ORDER — PERFLUTREN LIPID MICROSPHERE
1.0000 mL | INTRAVENOUS | Status: AC | PRN
  Administered 2015-08-18: 2 mL via INTRAVENOUS
  Filled 2015-08-18: qty 10

## 2015-08-18 MED ORDER — HEPARIN BOLUS VIA INFUSION
3000.0000 [IU] | Freq: Once | INTRAVENOUS | Status: AC
  Administered 2015-08-18: 3000 [IU] via INTRAVENOUS
  Filled 2015-08-18: qty 3000

## 2015-08-18 MED ORDER — METOLAZONE 2.5 MG PO TABS
2.5000 mg | ORAL_TABLET | ORAL | Status: DC
  Administered 2015-08-18 – 2015-08-20 (×5): 2.5 mg via ORAL
  Filled 2015-08-18 (×5): qty 1

## 2015-08-18 MED ORDER — METOLAZONE 2.5 MG PO TABS: 2.5000 mg | ORAL_TABLET | Freq: Two times a day (BID) | ORAL | Status: DC

## 2015-08-18 NOTE — Progress Notes (Signed)
Pt. Profile:  55 y/o morbidly obese male smoker with no significant PMH, and has not been followed routinely by a PCP, admitted by IM for worsening DOE and CP, found to be in atrial flutter.   Subjective: Breathing better but can only lie partially flat for periods of time. No pain.    Objective: Vital signs in last 24 hours: Temp:  [98 F (36.7 C)-98.5 F (36.9 C)] 98 F (36.7 C) (06/02 0514) Pulse Rate:  [55-79] 72 (06/02 0514) Resp:  [18] 18 (06/02 0514) BP: (135-159)/(93-108) 135/104 mmHg (06/02 0514) SpO2:  [97 %-99 %] 98 % (06/02 0514) Weight:  [343 lb 4.8 oz (155.72 kg)-351 lb 10.1 oz (159.5 kg)] 343 lb 4.8 oz (155.72 kg) (06/02 0454) Weight change:  Last BM Date: 08/16/15 Intake/Output from previous day: -1670 06/01 0701 - 06/02 0700 In: 1080 [P.O.:1080] Out: 2750 [Urine:2750] Intake/Output this shift:    PE: General:Pleasant affect, NAD Skin:Warm and dry, brisk capillary refill HEENT:normocephalic, sclera clear, mucus membranes moist Heart:irreg irreg  without murmur, gallup, rub or click Lungs: with rales in bases, occ rhonchi, no wheezes [id number], non tender, + BS, do not palpate liver spleen or masses Ext:1-2+ lower ext edema,  2+ radial pulses Neuro:alert and oriented X 3, MAE, follows commands, + facial symmetry Tele:  A flutter one pause 2.29 sec and at times HR to 140  Lab Results:  Recent Labs  08/17/15 0935  WBC 3.3*  HGB 14.0  HCT 43.0  PLT 97*   BMET  Recent Labs  08/17/15 0935 08/18/15 0350  NA 137 138  K 4.1 4.0  CL 103 101  CO2 29 31  GLUCOSE 91 98  BUN 10 15  CREATININE 0.70 0.83  CALCIUM 9.1 9.0    Recent Labs  08/17/15 2229 08/18/15 0350  TROPONINI 0.04* 0.04*    Lab Results  Component Value Date   CHOL 105 08/17/2015   HDL 28* 08/17/2015   LDLCALC 41 08/17/2015   TRIG 182* 08/17/2015   CHOLHDL 3.8 08/17/2015   Lab Results  Component Value Date   HGBA1C 5.4 08/17/2015     Lab Results    Component Value Date   TSH 1.713 08/17/2015    Hepatic Function Panel  Recent Labs  08/17/15 0935  PROT 6.8  ALBUMIN 3.1*  AST 63*  ALT 65*  ALKPHOS 54  BILITOT 0.6    Recent Labs  08/17/15 1233  CHOL 105   No results for input(s): PROTIME in the last 72 hours.  Lipid Panel     Component Value Date/Time   CHOL 105 08/17/2015 1233   TRIG 182* 08/17/2015 1233   HDL 28* 08/17/2015 1233   CHOLHDL 3.8 08/17/2015 1233   VLDL 36 08/17/2015 1233   LDLCALC 41 08/17/2015 1233      Studies/Results: X-ray Chest Pa And Lateral  08/17/2015  CLINICAL DATA:  Shortness of breath for 3 days with lower extremity swelling EXAM: CHEST  2 VIEW COMPARISON:  None. FINDINGS: Severe cardiac silhouette enlargement. Cephalization of the pulmonary vasculature. Azygos vein distension. No evidence of pulmonary edema consolidation or effusion. IMPRESSION: Severe cardiac silhouette enlargement with vascular congestion. No definite evidence of pulmonary edema. Electronically Signed   By: Skipper Cliche M.D.   On: 08/17/2015 14:03    Medications: I have reviewed the patient's current medications. Scheduled Meds: . enoxaparin (LOVENOX) injection  80 mg Subcutaneous Q24H  . furosemide  80 mg Intravenous BID   Continuous Infusions:  PRN Meds:.acetaminophen **OR** acetaminophen, perflutren lipid microspheres (DEFINITY) IV suspension, senna-docusate  Assessment/Plan: Principal Problem:   Atrial flutter (HCC) Active Problems:   Severe obesity (BMI >= 40) (HCC)   Uncontrolled hypertension   SOB (shortness of breath)   Current smoker   Chest pain     1.CHF: Patient with 6 month h/o progressive exertional dyspnea, PND, orthopnea, LEE, weight gain and dry cough. Also with abnormal BNP at 251 and evidence of volume overload with bilateral LEE. CXR with enlarged heart and vascular congestion.  Renal function and K are both normal.  IV lasix 80 IV BID  With diuresis now   -1670. Wt down signif from  yesterday    Continue to diuresis,  2D echo pending Further work up based on echo findings   2. Atrial Flutter: flutter waves present on telemetry. rate is controlled at times and at times 140.  Average HR controlled    . K and TSH are both normal. Mg 1.9. 2D echo pending.  Recommend IV heparin for a/c. Would hold off on oral anticoagulation until we known if he will need a LHC. CHA2DS2VaSc score 2 so far  .  3. CP: patient notes h/o SSCP/ tightness occuring at rest and with exertion on admit. Last episode of CP was yesterday. troponins 0.04  Flat  No trend     4. HTN: poorly controlled. BP 159/53 to 135/104 . Low sodium diet.   5. Obesity: given body habitus and atrial arrhthymias, there is concern for possible OSA. Recommend a sleep study, however this can be done as an OP.   6. Tobacco Abuse: smoking cessation advised. --On drug screen +cocaine but pt denies he does admit to 12 beers per week.     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 :15 minutes. Cecilie Kicks  Nurse Practitioner Certified Pager [phone] or after 5pm and on weekends call [phone] 08/18/2015, 9:37 AM   Pt seen and examined  Agree with findings as noted by L INgold  I have amended not to reflect my findings On exam:  Lungs rel clear  Cardia:  irreg Irreg  No S3  Ext with + edema Diuresing  I/O only - 1.6 L  Wt though is down   8 lbs   Exam does show some change   Cr has bumped slightly   WOuld continue diuresis   Can add Zaroxylyn prior to dose and follow response  I would recomm full dose antiocoaguation  CHADSVASc score is 2 (HTN and CHF) Echo pending WIll determined further Rx    Dorris Carnes

## 2015-08-18 NOTE — Progress Notes (Signed)
Pt had a 2.29 second pause. Pt is resting comfortably. Upon awakening pt he stated no distress or CP. Will continue to monitor pt closely.

## 2015-08-18 NOTE — Progress Notes (Addendum)
ANTICOAGULATION CONSULT NOTE - Initial Consult  Pharmacy Consult for heparin  Indication: atrial fibrillation  No Known Allergies  Patient Measurements: Height: 5\' 11"  (180.3 cm) Weight: (!) 343 lb 4.8 oz (155.72 kg) IBW/kg (Calculated) : 75.3 Heparin Dosing Weight: 114kg  Vital Signs: Temp: 97.9 F (36.6 C) (06/02 1352) Temp Source: Oral (06/02 1352) BP: 152/85 mmHg (06/02 1352) Pulse Rate: 75 (06/02 1352)  Labs:  Recent Labs  08/17/15 0935 08/17/15 2229 08/18/15 0350 08/18/15 1024  HGB 14.0  --   --   --   HCT 43.0  --   --   --   PLT 97*  --   --   --   CREATININE 0.70  --  0.83  --   TROPONINI  --  0.04* 0.04* 0.04*    Estimated Creatinine Clearance: 156.5 mL/min (by C-G formula based on Cr of 0.83).   Medical History: Past Medical History  Diagnosis Date  . Smoker   . Abscess and cellulitis     Medications:  No prescriptions prior to admission   Scheduled:  . furosemide  80 mg Intravenous BID  . metolazone  2.5 mg Oral 2 times per day    Assessment: 55 yo male with aflutter (CHADSVASAC=2) and CP to begin heparin  -Lovenox 80mg  sq given at 9pm on 6/1 -Hg= 14, plt= 97 (noted 138 in 2013)  Goal of Therapy:  Heparin level 0.3-0.7 units/ml Monitor platelets by anticoagulation protocol: Yes   Plan:  -Heparin bolus 4000 units IV followed by 1550 units/hr (~ 14 units/kg/hr) -Heparin level in 6 hours and daily wth CBC daily  Hildred Laser, Pharm D 08/18/2015 2:55 PM    Addendum: Initial heparin level is < 0.10. No issues with infusion per RN. Re-bolus heparin 3000 units x 1 and increase rate to 1850 units/hr. Follow up AM labs.  Nena Jordan, PharmD, BCPS 08/18/2015, 10:27 PM

## 2015-08-18 NOTE — Progress Notes (Signed)
  Echocardiogram 2D Echocardiogram with Definity 2 mL has been performed.  Darlina Sicilian M 08/18/2015, 10:09 AM

## 2015-08-18 NOTE — Progress Notes (Signed)
Subjective: Patient feels some improvement in breathing since yesterday. He has been able to walk to the bathroom without issue. He denies any chest pain or palpitations. He feels his LE edema is improving as well. He denies any known history of heart, lung, or liver disease. He reports smoking a pack per week since the "80's." He denies any cocaine or IVDU. He denies any family history of heart disease. He reports his normal weight was around 316 before his symptoms began (at least 6 months ago).  Objective: Vital signs in last 24 hours: Filed Vitals:   08/18/15 0454 08/18/15 0514 08/18/15 0950 08/18/15 1352  BP:  135/104 168/80 152/85  Pulse:  72  75  Temp:  98 F (36.7 C)  97.9 F (36.6 C)  TempSrc:  Oral  Oral  Resp:  18  18  Height:      Weight: 343 lb 4.8 oz (155.72 kg)     SpO2:  98%  100%   Weight change:   Intake/Output Summary (Last 24 hours) at 08/18/15 1412 Last data filed at 08/18/15 1352  Gross per 24 hour  Intake   1440 ml  Output   3650 ml  Net  -2210 ml   General: Obese, African [place] gentleman, resting in bed, NAD Cardiac: irregularly irregular, no rubs, murmurs or gallops Pulm: distant sounds, clear to auscultation bilaterally, moving normal volumes of air Abd: soft, nontender, nondistended, BS present Ext: warm and well perfused, pitting edema bilateral lower extremities Neuro: alert and oriented X3  Assessment/Plan: Principal Problem:   Atrial flutter (HCC) Active Problems:   Severe obesity (BMI >= 40) (HCC)   Uncontrolled hypertension   SOB (shortness of breath)   Current smoker   Chest pain  Suspected CHF: Patient with 6 month history of worsening DOE, Orthopnea, PND, weight gain, and lower extremity swelling suggestive of underlying CHF. Probably related to chronically untreated HTN versus CAD. May also be related to his obesity. Would also need to consider substance abuse as a cause. He denies history of substance abuse, however UDS is  positive for cocaine. If workup does not point to any of the above, would consider amyloidosis as possible etiology. -Cardiology following, appreciate recommendations and assistance -TTE completed, read pending -IV Lasix 80 mg BID per cards -fluid restriction -Monitor I/Os >> net down 2L -Weight down from 353 lbs to 343 lbs -Stress test vs LHC pending workup results -Monitor BMP with diuresis  Atrial Flutter: Flutter waves seen on EKGs and telemetry. CHADSVASc score is at least 2. Rate has been mostly controlled <100 with occasional jump to 130s. -Switched from Lovenox VTE ppx to Heparin anticoagulation  Chest pain: Reported right sided substernal chest pain with exertion on admission. Denies current chest pain. Troponins have been 0.04 x 3. May have underlying CAD and is possible candidate for stress test vs LHC per cardiology.  HTN: BPs ranging from [id number] systolic. -Diet modification -Will need to begin antihypertensive regimen  Hepatitis C: HCV antibody is positive. Patient denies history of IVDU or known liver problems. -Check HCV RNA  Morbid Obesity: BMI 49. Denies snoring, but does report waking at night with a choking sensation. -Encourage weight loss -Outpatient sleep study   Dispo: Disposition is deferred at this time, awaiting improvement of current medical problems.  Anticipated discharge in approximately 2-3 day(s).   The patient does not have a current PCP (Provider Default, MD) and does need an [facility] hospital follow-up appointment after discharge.  Zada Finders, MD 08/18/2015, 2:12 PM

## 2015-08-19 DIAGNOSIS — F1410 Cocaine abuse, uncomplicated: Secondary | ICD-10-CM | POA: Diagnosis present

## 2015-08-19 DIAGNOSIS — B182 Chronic viral hepatitis C: Secondary | ICD-10-CM | POA: Diagnosis present

## 2015-08-19 DIAGNOSIS — I509 Heart failure, unspecified: Secondary | ICD-10-CM

## 2015-08-19 DIAGNOSIS — R0789 Other chest pain: Secondary | ICD-10-CM

## 2015-08-19 HISTORY — DX: Chronic viral hepatitis C: B18.2

## 2015-08-19 LAB — COMPREHENSIVE METABOLIC PANEL
ALT: 70 U/L — AB (ref 17–63)
AST: 73 U/L — AB (ref 15–41)
Albumin: 3.1 g/dL — ABNORMAL LOW (ref 3.5–5.0)
Alkaline Phosphatase: 57 U/L (ref 38–126)
Anion gap: 8 (ref 5–15)
BILIRUBIN TOTAL: 0.6 mg/dL (ref 0.3–1.2)
BUN: 13 mg/dL (ref 6–20)
CALCIUM: 9.5 mg/dL (ref 8.9–10.3)
CO2: 36 mmol/L — ABNORMAL HIGH (ref 22–32)
CREATININE: 0.83 mg/dL (ref 0.61–1.24)
Chloride: 92 mmol/L — ABNORMAL LOW (ref 101–111)
GFR calc Af Amer: >60 mL/min (ref >60)
Glucose, Bld: 102 mg/dL — ABNORMAL HIGH (ref 65–99)
Potassium: 3.3 mmol/L — ABNORMAL LOW (ref 3.5–5.1)
Sodium: 136 mmol/L (ref 135–145)
TOTAL PROTEIN: 7.5 g/dL (ref 6.5–8.1)

## 2015-08-19 LAB — CBC
HCT: 43.4 % (ref 39.0–52.0)
Hemoglobin: 14.7 g/dL (ref 13.0–17.0)
MCH: 31.3 pg (ref 26.0–34.0)
MCHC: 33.9 g/dL (ref 30.0–36.0)
MCV: 92.5 fL (ref 78.0–100.0)
PLATELETS: 100 10*3/uL — AB (ref 150–400)
RBC: 4.69 MIL/uL (ref 4.22–5.81)
RDW: 13.6 % (ref 11.5–15.5)
WBC: 3.8 10*3/uL — ABNORMAL LOW (ref 4.0–10.5)

## 2015-08-19 LAB — HEPARIN LEVEL (UNFRACTIONATED)
HEPARIN UNFRACTIONATED: 0.26 [IU]/mL — AB (ref 0.30–0.70)
HEPARIN UNFRACTIONATED: 0.25 [IU]/mL — AB (ref 0.30–0.70)
HEPARIN UNFRACTIONATED: 0.36 [IU]/mL (ref 0.30–0.70)

## 2015-08-19 MED ORDER — DILTIAZEM HCL 60 MG PO TABS
60.0000 mg | ORAL_TABLET | Freq: Three times a day (TID) | ORAL | Status: DC
  Administered 2015-08-19 – 2015-08-21 (×7): 60 mg via ORAL
  Filled 2015-08-19 (×8): qty 1

## 2015-08-19 MED ORDER — POTASSIUM CHLORIDE CRYS ER 20 MEQ PO TBCR
40.0000 meq | EXTENDED_RELEASE_TABLET | Freq: Two times a day (BID) | ORAL | Status: DC
  Administered 2015-08-19 – 2015-08-21 (×5): 40 meq via ORAL
  Filled 2015-08-19 (×5): qty 2

## 2015-08-19 MED ORDER — HEPARIN BOLUS VIA INFUSION
1000.0000 [IU] | Freq: Once | INTRAVENOUS | Status: AC
  Administered 2015-08-19: 1000 [IU] via INTRAVENOUS
  Filled 2015-08-19: qty 1000

## 2015-08-19 NOTE — Progress Notes (Addendum)
   Subjective: This morning, he does not report any complaints, like chest pain, difficulty breathing. I explained to him the importance of doing what we can to facilitate the pumping of his heart, including smoking cessation and avoidance of illicit substances which he again denies to me this morning. I also explained to him that he was found to have a reactive hepatitis C antibody which is essentially a molecular fingerprint that he was exposed to the virus at some point in the past. He does report a history of incarceration in the early 1980s.  Objective: Vital signs in last 24 hours: Filed Vitals:   08/18/15 1352 08/18/15 1912 08/18/15 2041 08/19/15 0436  BP: 152/85 170/92 147/90 143/82  Pulse: 75  60 106  Temp: 97.9 F (36.6 C)  97.5 F (36.4 C) 98.4 F (36.9 C)  TempSrc: Oral  Oral Oral  Resp: 18  18 18   Height:      Weight:    334 lb 10.5 oz (151.8 kg)  SpO2: 100%  97% 99%   Weight change: -16 lb 15.6 oz (-7.7 kg)  Intake/Output Summary (Last 24 hours) at 08/19/15 1120 Last data filed at 08/19/15 0440  Gross per 24 hour  Intake 754.88 ml  Output   5050 ml  Net -4295.12 ml   General: Obese, African [place] gentleman, resting in bed, NAD Cardiac: irregularly irregular, no rubs, murmurs or gallops Pulm: distant sounds, clear to auscultation bilaterally,  Abd: soft, nontender, nondistended, BS present Ext: warm and well perfused, pitting edema bilateral lower extremities Neuro: alert and oriented X3  Assessment/Plan: Principal Problem:   Atrial flutter (HCC) Active Problems:   Severe obesity (BMI >= 40) (HCC)   Uncontrolled hypertension   Current smoker   Chest pain   Cocaine abuse   HCV antibody positive  CHF: Likely mediated by arrhythmia, HTN, and adverse effects of cocaine though cannot exclude CAD. Echo 08/18/15 notable for EF 50-55%, severe left and right atrial dilatation, mildly elevated pulmonary artery pressure.   -Continue IV Lasix 80 mg BID and metolazone  2.5mg  per Cardiology -Continue I&O -Stress test vs LHC pending workup results -Cardiology following, appreciate recommendations  Atrial Flutter: Flutter waves seen on EKGs and telemetry. Rates have been elevated overnight per telemetry 100s-140s. CHADSVASc score is at least 2.  -Continue heparin gtt per Cardiology -Started diltiazem 60mg  every 8 hours per Cardiology  HTN: BPs ranging from [id number] systolic. -Diet modification -Consider ACEi/ARB pending cardiac workup.  Hepatitis C: HCV antibody is positive. Patient denies history of IVDU or known liver problems though prior history of incarceration. -Check HCV RNA  Morbid Obesity: BMI 49. Denies snoring, but does report waking at night with a choking sensation. -Encourage weight loss -Outpatient sleep study  Dispo: Disposition is deferred at this time, awaiting improvement of current medical problems.  Anticipated discharge in approximately 2-3 day(s).   The patient does not have a current PCP (Provider Default, MD) and does need an [facility] hospital follow-up appointment after discharge.     LOS: 1 day   Riccardo Dubin, MD 08/19/2015, 11:20 AM

## 2015-08-19 NOTE — Progress Notes (Signed)
Pt. Profile:  55 y/o morbidly obese male smoker with no significant PMH, and has not been followed routinely by a PCP, admitted by IM for worsening DOE and CP, found to be in atrial flutter.   UDS was + for cocaine   Subjective: Breathing better but can only lie partially flat for periods of time. No pain.    Objective: Vital signs in last 24 hours: Temp:  [97.5 F (36.4 C)-98.4 F (36.9 C)] 98.4 F (36.9 C) (06/03 0436) Pulse Rate:  [60-106] 106 (06/03 0436) Resp:  [18] 18 (06/03 0436) BP: (143-170)/(82-92) 143/82 mmHg (06/03 0436) SpO2:  [97 %-100 %] 99 % (06/03 0436) Weight:  [334 lb 10.5 oz (151.8 kg)] 334 lb 10.5 oz (151.8 kg) (06/03 0436) Weight change: -16 lb 15.6 oz (-7.7 kg) Last BM Date: 08/16/15 Intake/Output from previous day: -1670 06/02 0701 - 06/03 0700 In: 1114.9 [P.O.:1080; I.V.:34.9] Out: 5350 [Urine:5350] Intake/Output this shift:    PE: General:Pleasant affect, NAD Skin:Warm and dry, brisk capillary refill HEENT:normocephalic, sclera clear, mucus membranes moist Heart:irreg irreg  without murmur, gallup, rub or click Lungs: with rales in bases, occ rhonchi, no wheezes [id number], non tender, + BS, do not palpate liver spleen or masses Ext:1-2+ lower ext edema,  2+ radial pulses Neuro:alert and oriented X 3, MAE, follows commands, + facial symmetry Tele:  A flutter one pause 2.29 sec and at times HR to 140  Lab Results:  Recent Labs  08/17/15 0935 08/19/15 0301  WBC 3.3* 3.8*  HGB 14.0 14.7  HCT 43.0 43.4  PLT 97* 100*   BMET  Recent Labs  08/18/15 0350 08/19/15 0301  NA 138 136  K 4.0 3.3*  CL 101 92*  CO2 31 36*  GLUCOSE 98 102*  BUN 15 13  CREATININE 0.83 0.83  CALCIUM 9.0 9.5    Recent Labs  08/18/15 0350 08/18/15 1024  TROPONINI 0.04* 0.04*    Lab Results  Component Value Date   CHOL 105 08/17/2015   HDL 28* 08/17/2015   LDLCALC 41 08/17/2015   TRIG 182* 08/17/2015   CHOLHDL 3.8 08/17/2015   Lab  Results  Component Value Date   HGBA1C 5.4 08/17/2015     Lab Results  Component Value Date   TSH 1.713 08/17/2015    Hepatic Function Panel  Recent Labs  08/19/15 0301  PROT 7.5  ALBUMIN 3.1*  AST 73*  ALT 70*  ALKPHOS 57  BILITOT 0.6    Recent Labs  08/17/15 1233  CHOL 105   No results for input(s): PROTIME in the last 72 hours.  Lipid Panel     Component Value Date/Time   CHOL 105 08/17/2015 1233   TRIG 182* 08/17/2015 1233   HDL 28* 08/17/2015 1233   CHOLHDL 3.8 08/17/2015 1233   VLDL 36 08/17/2015 1233   LDLCALC 41 08/17/2015 1233      Studies/Results: X-ray Chest Pa And Lateral  08/17/2015  CLINICAL DATA:  Shortness of breath for 3 days with lower extremity swelling EXAM: CHEST  2 VIEW COMPARISON:  None. FINDINGS: Severe cardiac silhouette enlargement. Cephalization of the pulmonary vasculature. Azygos vein distension. No evidence of pulmonary edema consolidation or effusion. IMPRESSION: Severe cardiac silhouette enlargement with vascular congestion. No definite evidence of pulmonary edema. Electronically Signed   By: Skipper Cliche M.D.   On: 08/17/2015 14:03    Medications: I have reviewed the patient's current medications. Scheduled Meds: . furosemide  80 mg Intravenous BID  .  metolazone  2.5 mg Oral 2 times per day  . potassium chloride  40 mEq Oral BID   Continuous Infusions: . heparin 2,100 Units/hr (08/19/15 0456)   PRN Meds:.acetaminophen **OR** acetaminophen, senna-docusate  Assessment/Plan: Principal Problem:   Atrial flutter (HCC) Active Problems:   Severe obesity (BMI >= 40) (HCC)   Uncontrolled hypertension   SOB (shortness of breath)   Current smoker   Chest pain    LOS: 1 day  1.CHF:  There is a good chance that he's been in atrial flutter for the past 5 or 6 months.  This may be the cause of his CHF.   In addition, this congestive heart failure may be due to his cocaine habit.   Echocardiogram shows apical akinesis although  it is noted that the apex was not well seen. This is likely  due to microvascular disease due to his cocaine abuse.    We can consider a cath on Monday .We did had a long discussion regarding his cocaine habit and I warned him that continued use would likely lead to more cardiac problems.   2. Atrial Flutter: flutter waves present on telemetry. rate is controlled at times and at times 140.  Average HR controlled    . K and TSH are both normal. Mg 1.9. 2D echo pending.  Recommend IV heparin for a/c.  Will start Diltiazem 60 mg Q8 for now.     I've told him that continued cocaine use will likely lead to more cardiac problems.   3. CP: patient notes h/o SSCP/ tightness occuring at rest and with exertion on admit. Last episode of CP was yesterday. troponins 0.04  Flat  No trend     4. HTN: poorly controlled. BP 159/53 to 135/104 . Low sodium diet.   5. Obesity: given body habitus and atrial arrhthymias, there is concern for possible OSA. Recommend a sleep study, however this can be done as an OP.   8. HCV ab + > 11   Time spent 25 minutes, > half of that face to face.     Mertie Moores, MD  08/19/2015 10:56 AM    [address],  [place], [place]  [id number] Pager [phone] Phone: [phone]; Fax: [phone]

## 2015-08-19 NOTE — Progress Notes (Signed)
ANTICOAGULATION CONSULT NOTE - Follow Up Consult  Pharmacy Consult for heparin Indication: atrial fibrillation  Labs:  Recent Labs  08/17/15 0935 08/17/15 2229 08/18/15 0350 08/18/15 1024 08/18/15 2144 08/19/15 0301  HGB 14.0  --   --   --   --  14.7  HCT 43.0  --   --   --   --  43.4  PLT 97*  --   --   --   --  100*  HEPARINUNFRC  --   --   --   --  <0.10* 0.26*  CREATININE 0.70  --  0.83  --   --   --   TROPONINI  --  0.04* 0.04* 0.04*  --   --      Assessment: 54yo male remains subtherapeutic on heparin after rate change though now closer to goal.  Goal of Therapy:  Heparin level 0.3-0.7 units/ml   Plan:  Will increase heparin gtt by 1-2 units/kg/hr to 2100 units/hr and check level in 6hr.  Wynona Neat, PharmD, BCPS  08/19/2015,4:31 AM

## 2015-08-19 NOTE — Progress Notes (Signed)
ANTICOAGULATION CONSULT NOTE - Follow Up Consult  Pharmacy Consult for Heparin Indication: atrial fibrillation  No Known Allergies  Patient Measurements: Height: 5\' 11"  (180.3 cm) Weight: (!) 334 lb 10.5 oz (151.8 kg) IBW/kg (Calculated) : 75.3 Heparin Dosing Weight: 110 kg  Vital Signs: Temp: 97.6 F (36.4 C) (06/03 1453) Temp Source: Oral (06/03 1453) BP: 128/79 mmHg (06/03 1453) Pulse Rate: 86 (06/03 1453)  Labs:  Recent Labs  08/17/15 0935 08/17/15 2229 08/18/15 0350 08/18/15 1024  08/19/15 0301 08/19/15 1003 08/19/15 1854  HGB 14.0  --   --   --   --  14.7  --   --   HCT 43.0  --   --   --   --  43.4  --   --   PLT 97*  --   --   --   --  100*  --   --   HEPARINUNFRC  --   --   --   --   < > 0.26* 0.25* 0.36  CREATININE 0.70  --  0.83  --   --  0.83  --   --   TROPONINI  --  0.04* 0.04* 0.04*  --   --   --   --   < > = values in this interval not displayed.  Estimated Creatinine Clearance: 154.2 mL/min (by C-G formula based on Cr of 0.83).   Medications:  Infusions:  . heparin 2,300 Units/hr (08/19/15 1320)    Assessment: 55 year old male receiving anticoagulation with Heparin for atrial fibrillation.  His heparin level is at the lower end of the therapeutic range after a rate increase. No bleeding complications noted.  Goal of Therapy:  Heparin level 0.3-0.7 units/ml Monitor platelets by anticoagulation protocol: Yes   Plan:  Increase Heparin to 2400 units/hr Daily Heparin level and CBC  Legrand Como, Pharm.D., BCPS, AAHIVP Clinical Pharmacist Phone: [phone] or [phone] 08/19/2015, 8:05 PM

## 2015-08-20 DIAGNOSIS — F1410 Cocaine abuse, uncomplicated: Secondary | ICD-10-CM

## 2015-08-20 DIAGNOSIS — F14188 Cocaine abuse with other cocaine-induced disorder: Secondary | ICD-10-CM

## 2015-08-20 DIAGNOSIS — Z720 Tobacco use: Secondary | ICD-10-CM

## 2015-08-20 LAB — CBC
HEMATOCRIT: 45 % (ref 39.0–52.0)
HEMOGLOBIN: 15 g/dL (ref 13.0–17.0)
MCH: 30.5 pg (ref 26.0–34.0)
MCHC: 33.3 g/dL (ref 30.0–36.0)
MCV: 91.5 fL (ref 78.0–100.0)
Platelets: 105 10*3/uL — ABNORMAL LOW (ref 150–400)
RBC: 4.92 MIL/uL (ref 4.22–5.81)
RDW: 13.6 % (ref 11.5–15.5)
WBC: 4.4 10*3/uL (ref 4.0–10.5)

## 2015-08-20 LAB — BASIC METABOLIC PANEL
ANION GAP: 10 (ref 5–15)
BUN: 16 mg/dL (ref 6–20)
CHLORIDE: 88 mmol/L — AB (ref 101–111)
CO2: 36 mmol/L — AB (ref 22–32)
CREATININE: 1.05 mg/dL (ref 0.61–1.24)
Calcium: 9.6 mg/dL (ref 8.9–10.3)
GFR calc non Af Amer: >60 mL/min (ref >60)
Glucose, Bld: 94 mg/dL (ref 65–99)
POTASSIUM: 3.7 mmol/L (ref 3.5–5.1)
Sodium: 134 mmol/L — ABNORMAL LOW (ref 135–145)

## 2015-08-20 LAB — HCV RNA QUANT
HCV Quantitative Log: 5.706 log10 IU/mL (ref >1.70)
HCV Quantitative: 508000 IU/mL (ref >50)

## 2015-08-20 LAB — MAGNESIUM: MAGNESIUM: 2 mg/dL (ref 1.7–2.4)

## 2015-08-20 LAB — HEPARIN LEVEL (UNFRACTIONATED)
HEPARIN UNFRACTIONATED: 0.23 [IU]/mL — AB (ref 0.30–0.70)
HEPARIN UNFRACTIONATED: 0.44 [IU]/mL (ref 0.30–0.70)
HEPARIN UNFRACTIONATED: 0.52 [IU]/mL (ref 0.30–0.70)

## 2015-08-20 MED ORDER — FUROSEMIDE 10 MG/ML IJ SOLN
80.0000 mg | Freq: Two times a day (BID) | INTRAMUSCULAR | Status: DC
  Administered 2015-08-22: 80 mg via INTRAVENOUS
  Filled 2015-08-20: qty 8

## 2015-08-20 MED ORDER — SODIUM CHLORIDE 0.9 % WEIGHT BASED INFUSION
1.0000 mL/kg/h | INTRAVENOUS | Status: DC
  Administered 2015-08-21: 1 mL/kg/h via INTRAVENOUS

## 2015-08-20 MED ORDER — SODIUM CHLORIDE 0.9 % IV SOLN: 250.0000 mL | INTRAVENOUS | Status: DC | PRN

## 2015-08-20 MED ORDER — SODIUM CHLORIDE 0.9 % WEIGHT BASED INFUSION
3.0000 mL/kg/h | INTRAVENOUS | Status: DC
  Administered 2015-08-21: 3 mL/kg/h via INTRAVENOUS

## 2015-08-20 MED ORDER — ASPIRIN 81 MG PO CHEW
81.0000 mg | CHEWABLE_TABLET | ORAL | Status: AC
  Administered 2015-08-21: 81 mg via ORAL
  Filled 2015-08-20: qty 1

## 2015-08-20 MED ORDER — HEPARIN BOLUS VIA INFUSION
3000.0000 [IU] | Freq: Once | INTRAVENOUS | Status: AC
  Administered 2015-08-20: 3000 [IU] via INTRAVENOUS
  Filled 2015-08-20: qty 3000

## 2015-08-20 MED ORDER — SODIUM CHLORIDE 0.9% FLUSH: 3.0000 mL | Freq: Two times a day (BID) | INTRAVENOUS | Status: DC

## 2015-08-20 MED ORDER — SODIUM CHLORIDE 0.9% FLUSH: 3.0000 mL | INTRAVENOUS | Status: DC | PRN

## 2015-08-20 MED ORDER — METOLAZONE 2.5 MG PO TABS
2.5000 mg | ORAL_TABLET | ORAL | Status: DC
  Administered 2015-08-22 (×2): 2.5 mg via ORAL
  Filled 2015-08-20 (×2): qty 1

## 2015-08-20 NOTE — Progress Notes (Signed)
ANTICOAGULATION CONSULT NOTE - Follow Up Consult  Pharmacy Consult for heparin Indication: atrial fibrillation  Labs:  Recent Labs  08/17/15 0935 08/17/15 2229 08/18/15 0350 08/18/15 1024  08/19/15 0301 08/19/15 1003 08/19/15 1854 08/20/15 0212  HGB 14.0  --   --   --   --  14.7  --   --  15.0  HCT 43.0  --   --   --   --  43.4  --   --  45.0  PLT 97*  --   --   --   --  100*  --   --  105*  HEPARINUNFRC  --   --   --   --   < > 0.26* 0.25* 0.36 0.23*  CREATININE 0.70  --  0.83  --   --  0.83  --   --  1.05  TROPONINI  --  0.04* 0.04* 0.04*  --   --   --   --   --   < > = values in this interval not displayed.   Assessment: 54yo male subtherapeutic on heparin after one level at low end of goal.  Goal of Therapy:  Heparin level 0.3-0.7 units/ml   Plan:  Will rebolus with heparin 3000 units x1 and increase gtt by 2 units/kg/hr to 2700 units/hr and check level in 6hr.  Wynona Neat, PharmD, BCPS  08/20/2015,4:23 AM

## 2015-08-20 NOTE — Progress Notes (Signed)
   Subjective: This morning, he does not report any complaints, like chest pain, difficulty breathing. He expresses understanding that he will be taken to cardiac cath tomorrow to better assess his underlying heart disease..  Objective: Vital signs in last 24 hours: Filed Vitals:   08/19/15 0436 08/19/15 1453 08/19/15 2149 08/20/15 0604  BP: 143/82 128/79 109/68 126/74  Pulse: 106 86 76 71  Temp: 98.4 F (36.9 C) 97.6 F (36.4 C)  97.4 F (36.3 C)  TempSrc: Oral Oral  Axillary  Resp: 18     Height:      Weight: 334 lb 10.5 oz (151.8 kg)   332 lb 1.6 oz (150.64 kg)  SpO2: 99% 98% 99% 99%   Weight change: -2 lb 8.9 oz (-1.16 kg)  Intake/Output Summary (Last 24 hours) at 08/20/15 1123 Last data filed at 08/20/15 0600  Gross per 24 hour  Intake    840 ml  Output   2375 ml  Net  -1535 ml   General: Obese, African [place] gentleman, sitting in chair Cardiac: irregular rhythm, ?tachycardic  Pulm: distant sounds, clear to auscultation bilaterally,  Abd: soft, nontender, nondistended, BS present Ext: warm and well perfused, trace pitting edema bilateral lower extremities Neuro: alert and oriented X3  Assessment/Plan: Principal Problem:   Atrial flutter (HCC) Active Problems:   Severe obesity (BMI >= 40) (HCC)   Uncontrolled hypertension   Current smoker   Chest pain   Cocaine abuse   HCV antibody positive  CHF: Likely mediated by arrhythmia, HTN, and adverse effects of cocaine though cannot exclude CAD. Echo 08/18/15 notable for EF 50-55%, severe left and right atrial dilatation, mildly elevated pulmonary artery pressure.   -Continue IV Lasix 80 mg BID and metolazone 2.5mg  per Cardiology -Continue I&O -Follow-up cardiac cath results  -Cardiology following, appreciate recommendations  Atrial Flutter: Flutter waves seen on EKGs and telemetry. Rates have been elevated overnight per telemetry 100s-140s. CHADSVASc score is at least 2.  -Continue heparin gtt per Cardiology with  plan to transition to rivaroxaban 20 mg daily 24 hours following cardiac catheterization -Continue diltiazem 60mg  every 8 hours per Cardiology -Follow-up in 3-4 weeks with cardiology for cardioversion  HTN: BPs ranging from 100s-120s/60s-70s.  -Consider ACEi/ARB pending cardiac workup.  Hepatitis C: HCV antibody is positive. Patient denies history of IVDU or known liver problems though prior history of incarceration. -Follow-up HCV RNA  Morbid Obesity: BMI 49. Denies snoring, but does report waking at night with a choking sensation. -Encourage weight loss -Outpatient sleep study  Dispo: Disposition is deferred at this time, awaiting improvement of current medical problems.  Anticipated discharge in approximately 2-3 day(s).   The patient does not have a current PCP (Provider Default, MD) and does need an [facility] hospital follow-up appointment after discharge.     LOS: 2 days   Riccardo Dubin, MD 08/20/2015, 11:23 AM

## 2015-08-20 NOTE — Progress Notes (Addendum)
ANTICOAGULATION CONSULT NOTE - Follow Up Consult  Pharmacy Consult for Heparin Indication: atrial fibrillation  No Known Allergies  Patient Measurements: Height: 5\' 11"  (180.3 cm) Weight: (!) 332 lb 1.6 oz (150.64 kg) IBW/kg (Calculated) : 75.3 Heparin Dosing Weight: 110 kg  Vital Signs: Temp: 97.4 F (36.3 C) (06/04 0604) Temp Source: Axillary (06/04 0604) BP: 126/74 mmHg (06/04 0604) Pulse Rate: 71 (06/04 0604)  Labs:  Recent Labs  08/17/15 2229 08/18/15 0350 08/18/15 1024  08/19/15 0301  08/19/15 1854 08/20/15 0212 08/20/15 1106  HGB  --   --   --   --  14.7  --   --  15.0  --   HCT  --   --   --   --  43.4  --   --  45.0  --   PLT  --   --   --   --  100*  --   --  105*  --   HEPARINUNFRC  --   --   --   < > 0.26*  < > 0.36 0.23* 0.44  CREATININE  --  0.83  --   --  0.83  --   --  1.05  --   TROPONINI 0.04* 0.04* 0.04*  --   --   --   --   --   --   < > = values in this interval not displayed.  Estimated Creatinine Clearance: 121.3 mL/min (by C-G formula based on Cr of 1.05).   Medications:  Infusions:  . heparin 2,700 Units/hr (08/20/15 [id number])    Assessment: 55 year old male receiving anticoagulation with Heparin for atrial fibrillation/CP. Heparin level is at goal on 2700 units/hr. Plans noted for cath then begin Xarelto.   Goal of Therapy:  Heparin level 0.3-0.7 units/ml Monitor platelets by anticoagulation protocol: Yes   Plan:  No heparin changes needed Confirm a heparin level today Daily Heparin level and CBC  Hildred Laser, Pharm D 08/20/2015 12:25 PM   Addendum -heparin level confirmed at goal (HL= 0.52)  Plan -no heparin changes needed  Hildred Laser, Pharm D 08/20/2015 7:50 PM

## 2015-08-20 NOTE — Progress Notes (Signed)
Pt. Profile:  55 y/o morbidly obese male smoker with no significant PMH, and has not been followed routinely by a PCP, admitted by IM for worsening DOE and CP, found to be in atrial flutter.   UDS was + for cocaine   Subjective: Breathing better but can only lie partially flat for periods of time. No pain.    Objective: Vital signs in last 24 hours: Temp:  [97.4 F (36.3 C)-97.6 F (36.4 C)] 97.4 F (36.3 C) (06/04 0604) Pulse Rate:  [71-86] 71 (06/04 0604) BP: (109-128)/(68-79) 126/74 mmHg (06/04 0604) SpO2:  [98 %-99 %] 99 % (06/04 0604) Weight:  [332 lb 1.6 oz (150.64 kg)] 332 lb 1.6 oz (150.64 kg) (06/04 0604) Weight change: -2 lb 8.9 oz (-1.16 kg) Last BM Date: 08/16/15 Intake/Output from previous day: -1670 06/03 0701 - 06/04 0700 In: 1080 [P.O.:1080] Out: 3175 [Urine:3175] Intake/Output this shift:    PE: General:Pleasant affect, NAD Skin:Warm and dry, brisk capillary refill HEENT:normocephalic, sclera clear, mucus membranes moist Heart:irreg irreg  without murmur, gallup, rub or click Lungs: with rales in bases, occ rhonchi, no wheezes [id number], non tender, + BS, do not palpate liver spleen or masses Ext:1-2+ lower ext edema,  2+ radial pulses Neuro:alert and oriented X 3, MAE, follows commands, + facial symmetry Tele:  A flutter one pause 2.29 sec and at times HR to 140  Lab Results:  Recent Labs  08/19/15 0301 08/20/15 0212  WBC 3.8* 4.4  HGB 14.7 15.0  HCT 43.4 45.0  PLT 100* 105*   BMET  Recent Labs  08/19/15 0301 08/20/15 0212  NA 136 134*  K 3.3* 3.7  CL 92* 88*  CO2 36* 36*  GLUCOSE 102* 94  BUN 13 16  CREATININE 0.83 1.05  CALCIUM 9.5 9.6    Recent Labs  08/18/15 0350 08/18/15 1024  TROPONINI 0.04* 0.04*    Lab Results  Component Value Date   CHOL 105 08/17/2015   HDL 28* 08/17/2015   LDLCALC 41 08/17/2015   TRIG 182* 08/17/2015   CHOLHDL 3.8 08/17/2015   Lab Results  Component Value Date   HGBA1C 5.4  08/17/2015     Lab Results  Component Value Date   TSH 1.713 08/17/2015    Hepatic Function Panel  Recent Labs  08/19/15 0301  PROT 7.5  ALBUMIN 3.1*  AST 73*  ALT 70*  ALKPHOS 57  BILITOT 0.6    Recent Labs  08/17/15 1233  CHOL 105   No results for input(s): PROTIME in the last 72 hours.  Lipid Panel     Component Value Date/Time   CHOL 105 08/17/2015 1233   TRIG 182* 08/17/2015 1233   HDL 28* 08/17/2015 1233   CHOLHDL 3.8 08/17/2015 1233   VLDL 36 08/17/2015 1233   LDLCALC 41 08/17/2015 1233      Studies/Results: No results found.  Medications: I have reviewed the patient's current medications. Scheduled Meds: . diltiazem  60 mg Oral Q8H  . furosemide  80 mg Intravenous BID  . metolazone  2.5 mg Oral 2 times per day  . potassium chloride  40 mEq Oral BID   Continuous Infusions: . heparin 2,700 Units/hr (08/20/15 0937)   PRN Meds:.acetaminophen **OR** acetaminophen, senna-docusate  Assessment/Plan: Principal Problem:   Atrial flutter (HCC) Active Problems:   Severe obesity (BMI >= 40) (HCC)   Uncontrolled hypertension   Current smoker   Chest pain   Cocaine abuse   HCV antibody positive    LOS:  2 days  1.CHF:  There is a good chance that he's been in atrial flutter for the past 5 or 6 months.  This may be the cause of his CHF.   In addition, this congestive heart failure may be due to his cocaine habit.   Echocardiogram shows apical akinesis although it is noted that the apex was not well seen. This is likely  due to microvascular disease due to his cocaine abuse.    Will schedule cath on Monday .We did had a long discussion regarding his cocaine habit and I warned him that continued use would likely lead to more cardiac problems.   2. Atrial Flutter: flutter waves present on telemetry. rate is controlled at times and at times 140.  Average HR controlled    . K and TSH are both normal. Mg 1.9. 2D echo pending.  Recommend IV heparin for a/c.    Will start Diltiazem 60 mg Q8 for now.    Rate is well controlled. Continue heparin for now.   Can start Xarelto 20 mg a day after his cath ( would wait a day to allow the cath sites to heal). He will be following up with Dr. Harrington Challenger .   We can do cardioversion in 3-4 weeks   I've told him that continued cocaine use will likely lead to more cardiac problems.   3. CP: patient notes h/o SSCP/ tightness occuring at rest and with exertion on admit. Last episode of CP was yesterday. troponins 0.04  Flat  No trend     4. HTN: poorly controlled. BP 159/53 to 135/104 . Low sodium diet.   5. Obesity: given body habitus and atrial arrhthymias, there is concern for possible OSA. Recommend a sleep study, however this can be done as an OP.   8. HCV ab + > 11   Time spent 25 minutes, > half of that face to face.     Mertie Moores, MD  08/20/2015 11:05 AM    [address] [place],  [facility], [place]  [address] Pager [phone] Phone: [phone]; Fax: [phone]

## 2015-08-21 ENCOUNTER — Encounter (HOSPITAL_COMMUNITY)
Admission: AD | Disposition: A | Discharge status: Home / Self Care | Payer: Self-pay | Source: Ambulatory Visit | Attending: Oncology

## 2015-08-21 DIAGNOSIS — R079 Chest pain, unspecified: Secondary | ICD-10-CM

## 2015-08-21 DIAGNOSIS — I483 Typical atrial flutter: Principal | ICD-10-CM

## 2015-08-21 DIAGNOSIS — I422 Other hypertrophic cardiomyopathy: Secondary | ICD-10-CM

## 2015-08-21 DIAGNOSIS — I209 Angina pectoris, unspecified: Secondary | ICD-10-CM

## 2015-08-21 HISTORY — PX: CARDIAC CATHETERIZATION: SHX172

## 2015-08-21 LAB — POCT I-STAT 3, ART BLOOD GAS (G3+)
Acid-Base Excess: 7 mmol/L — ABNORMAL HIGH (ref 0.0–2.0)
BICARBONATE: 32.6 meq/L — AB (ref 20.0–24.0)
O2 Saturation: 91 %
PCO2 ART: 48.2 mmHg — AB (ref 35.0–45.0)
PH ART: 7.438 (ref 7.350–7.450)
PO2 ART: 60 mmHg — AB (ref 80.0–100.0)
TCO2: 34 mmol/L (ref 0–100)

## 2015-08-21 LAB — BASIC METABOLIC PANEL
Anion gap: 12 (ref 5–15)
BUN: 25 mg/dL — AB (ref 6–20)
CALCIUM: 9.4 mg/dL (ref 8.9–10.3)
CHLORIDE: 87 mmol/L — AB (ref 101–111)
CO2: 32 mmol/L (ref 22–32)
CREATININE: 1.01 mg/dL (ref 0.61–1.24)
Glucose, Bld: 115 mg/dL — ABNORMAL HIGH (ref 65–99)
Potassium: 5.2 mmol/L — ABNORMAL HIGH (ref 3.5–5.1)
SODIUM: 131 mmol/L — AB (ref 135–145)

## 2015-08-21 LAB — POCT ACTIVATED CLOTTING TIME
ACTIVATED CLOTTING TIME: 236 s
Activated Clotting Time: 197 seconds

## 2015-08-21 LAB — MAGNESIUM: MAGNESIUM: 2.1 mg/dL (ref 1.7–2.4)

## 2015-08-21 LAB — CBC
HCT: 45.4 % (ref 39.0–52.0)
HEMOGLOBIN: 15.4 g/dL (ref 13.0–17.0)
MCH: 30.5 pg (ref 26.0–34.0)
MCHC: 33.9 g/dL (ref 30.0–36.0)
MCV: 89.9 fL (ref 78.0–100.0)
PLATELETS: 107 10*3/uL — AB (ref 150–400)
RBC: 5.05 MIL/uL (ref 4.22–5.81)
RDW: 13.4 % (ref 11.5–15.5)
WBC: 5 10*3/uL (ref 4.0–10.5)

## 2015-08-21 LAB — PROTIME-INR
INR: 1.12 (ref 0.00–1.49)
Prothrombin Time: 14.6 seconds (ref 11.6–15.2)

## 2015-08-21 LAB — POCT I-STAT 3, VENOUS BLOOD GAS (G3P V)
ACID-BASE EXCESS: 9 mmol/L — AB (ref 0.0–2.0)
Acid-Base Excess: 9 mmol/L — ABNORMAL HIGH (ref 0.0–2.0)
BICARBONATE: 36.3 meq/L — AB (ref 20.0–24.0)
Bicarbonate: 36.2 mEq/L — ABNORMAL HIGH (ref 20.0–24.0)
O2 SAT: 59 %
O2 Saturation: 58 %
PCO2 VEN: 57.5 mmHg — AB (ref 45.0–50.0)
PH VEN: 7.408 — AB (ref 7.250–7.300)
TCO2: 38 mmol/L (ref 0–100)
TCO2: 38 mmol/L (ref 0–100)
pCO2, Ven: 57.5 mmHg — ABNORMAL HIGH (ref 45.0–50.0)
pH, Ven: 7.407 — ABNORMAL HIGH (ref 7.250–7.300)
pO2, Ven: 31 mmHg (ref 31.0–45.0)
pO2, Ven: 31 mmHg (ref 31.0–45.0)

## 2015-08-21 LAB — HEPARIN LEVEL (UNFRACTIONATED): HEPARIN UNFRACTIONATED: 0.42 [IU]/mL (ref 0.30–0.70)

## 2015-08-21 SURGERY — RIGHT/LEFT HEART CATH AND CORONARY ANGIOGRAPHY
Anesthesia: LOCAL

## 2015-08-21 MED ORDER — MIDAZOLAM HCL 2 MG/2ML IJ SOLN
INTRAMUSCULAR | Status: AC
  Filled 2015-08-21: qty 2

## 2015-08-21 MED ORDER — HEPARIN (PORCINE) IN NACL 2-0.9 UNIT/ML-% IJ SOLN
INTRAMUSCULAR | Status: DC | PRN
  Administered 2015-08-21: 1000 mL

## 2015-08-21 MED ORDER — SODIUM CHLORIDE 0.9 % WEIGHT BASED INFUSION: 1.0000 mL/kg/h | INTRAVENOUS | Status: AC

## 2015-08-21 MED ORDER — LIDOCAINE HCL (PF) 1 % IJ SOLN
INTRAMUSCULAR | Status: AC
  Filled 2015-08-21: qty 30

## 2015-08-21 MED ORDER — SODIUM CHLORIDE 0.9% FLUSH
3.0000 mL | Freq: Two times a day (BID) | INTRAVENOUS | Status: DC
Start: 2015-08-21 — End: 2015-08-22
  Administered 2015-08-22: 3 mL via INTRAVENOUS

## 2015-08-21 MED ORDER — VERAPAMIL HCL 2.5 MG/ML IV SOLN
INTRAVENOUS | Status: DC | PRN
  Administered 2015-08-21: 10 mL via INTRA_ARTERIAL

## 2015-08-21 MED ORDER — FENTANYL CITRATE (PF) 100 MCG/2ML IJ SOLN
INTRAMUSCULAR | Status: DC | PRN
  Administered 2015-08-21 (×2): 50 ug via INTRAVENOUS

## 2015-08-21 MED ORDER — SODIUM CHLORIDE 0.9% FLUSH: 3.0000 mL | INTRAVENOUS | Status: DC | PRN

## 2015-08-21 MED ORDER — DILTIAZEM HCL 60 MG PO TABS
60.0000 mg | ORAL_TABLET | Freq: Three times a day (TID) | ORAL | Status: DC
  Administered 2015-08-22: 60 mg via ORAL

## 2015-08-21 MED ORDER — FENTANYL CITRATE (PF) 100 MCG/2ML IJ SOLN
INTRAMUSCULAR | Status: AC
  Filled 2015-08-21: qty 2

## 2015-08-21 MED ORDER — IOPAMIDOL (ISOVUE-370) INJECTION 76%
INTRAVENOUS | Status: DC | PRN
  Administered 2015-08-21: 95 mL via INTRA_ARTERIAL

## 2015-08-21 MED ORDER — OFF THE BEAT BOOK
Freq: Once | Status: AC
  Administered 2015-08-21: 09:00:00
  Filled 2015-08-21: qty 1

## 2015-08-21 MED ORDER — MIDAZOLAM HCL 2 MG/2ML IJ SOLN
INTRAMUSCULAR | Status: DC | PRN
  Administered 2015-08-21: 2 mg via INTRAVENOUS

## 2015-08-21 MED ORDER — HEPARIN (PORCINE) IN NACL 2-0.9 UNIT/ML-% IJ SOLN
INTRAMUSCULAR | Status: AC
  Filled 2015-08-21: qty 1000

## 2015-08-21 MED ORDER — LIDOCAINE HCL (PF) 1 % IJ SOLN
INTRAMUSCULAR | Status: DC | PRN
  Administered 2015-08-21 (×2): 2 mL via INTRADERMAL

## 2015-08-21 MED ORDER — HEPARIN (PORCINE) IN NACL 100-0.45 UNIT/ML-% IJ SOLN
2800.0000 [IU]/h | INTRAMUSCULAR | Status: DC
  Administered 2015-08-22: 2700 [IU]/h via INTRAVENOUS
  Filled 2015-08-21 (×2): qty 250

## 2015-08-21 MED ORDER — VERAPAMIL HCL 2.5 MG/ML IV SOLN
INTRAVENOUS | Status: AC
  Filled 2015-08-21: qty 2

## 2015-08-21 MED ORDER — HEPARIN SODIUM (PORCINE) 1000 UNIT/ML IJ SOLN
INTRAMUSCULAR | Status: AC
  Filled 2015-08-21: qty 1

## 2015-08-21 MED ORDER — IOPAMIDOL (ISOVUE-370) INJECTION 76%
INTRAVENOUS | Status: AC
  Filled 2015-08-21: qty 100

## 2015-08-21 MED ORDER — SODIUM CHLORIDE 0.9 % IV SOLN: 250.0000 mL | INTRAVENOUS | Status: DC | PRN

## 2015-08-21 MED ORDER — HEPARIN SODIUM (PORCINE) 1000 UNIT/ML IJ SOLN
INTRAMUSCULAR | Status: DC | PRN
  Administered 2015-08-21: 7000 [IU] via INTRAVENOUS

## 2015-08-21 SURGICAL SUPPLY — 19 items
CATH BALLN WEDGE 5F 110CM (CATHETERS) ×1 IMPLANT
CATH OPTITORQUE TIG 4.0 5F (CATHETERS) ×1 IMPLANT
COVER PRB 48X5XTLSCP FOLD TPE (BAG) IMPLANT
COVER PROBE 5X48 (BAG) ×2
DEVICE RAD COMP TR BAND LRG (VASCULAR PRODUCTS) ×1 IMPLANT
GLIDESHEATH SLEND A-KIT 6F 22G (SHEATH) ×1 IMPLANT
KIT HEART LEFT (KITS) ×2 IMPLANT
NDL PERC 21GX4CM (NEEDLE) IMPLANT
NEEDLE PERC 21GX4CM (NEEDLE) ×2 IMPLANT
PACK CARDIAC CATHETERIZATION (CUSTOM PROCEDURE TRAY) ×2 IMPLANT
SHEATH FAST CATH BRACH 5F 5CM (SHEATH) ×1 IMPLANT
SHEATH PINNACLE 7F 10CM (SHEATH) IMPLANT
TRANSDUCER W/STOPCOCK (MISCELLANEOUS) ×3 IMPLANT
TUBING ART PRESS 72  MALE/FEM (TUBING)
TUBING ART PRESS 72 MALE/FEM (TUBING) IMPLANT
TUBING CIL FLEX 10 FLL-RA (TUBING) ×2 IMPLANT
WIRE EMERALD 3MM-J .035X150CM (WIRE) IMPLANT
WIRE MICROINTRODUCER 60CM (WIRE) ×1 IMPLANT
WIRE SAFE-T 1.5MM-J .035X260CM (WIRE) ×2 IMPLANT

## 2015-08-21 NOTE — Progress Notes (Signed)
ANTICOAGULATION CONSULT NOTE - Follow Up Consult  Pharmacy Consult for Heparin Indication: atrial fibrillation  No Known Allergies  Patient Measurements: Height: 5\' 11"  (180.3 cm) Weight: (!) 331 lb 11.2 oz (150.458 kg) IBW/kg (Calculated) : 75.3 Heparin Dosing Weight: 110 kg  Vital Signs: Temp: 98.1 F (36.7 C) (06/05 0427) Temp Source: Oral (06/05 0427) BP: 131/85 mmHg (06/05 0427) Pulse Rate: 74 (06/05 0427)  Labs:  Recent Labs  08/18/15 1024  08/19/15 0301  08/20/15 0212 08/20/15 1106 08/20/15 1913 08/21/15 0300 08/21/15 0615  HGB  --   < > 14.7  --  15.0  --   --  15.4  --   HCT  --   --  43.4  --  45.0  --   --  45.4  --   PLT  --   --  100*  --  105*  --   --  107*  --   LABPROT  --   --   --   --   --   --   --   --  14.6  INR  --   --   --   --   --   --   --   --  1.12  HEPARINUNFRC  --   < > 0.26*  < > 0.23* 0.44 0.52  --  0.42  CREATININE  --   --  0.83  --  1.05  --   --  1.01  --   TROPONINI 0.04*  --   --   --   --   --   --   --   --   < > = values in this interval not displayed.  Estimated Creatinine Clearance: 126.1 mL/min (by C-G formula based on Cr of 1.01).   Medications:  Infusions:  . sodium chloride 1 mL/kg/hr (08/21/15 0713)  . heparin 2,700 Units/hr (08/21/15 0426)    Assessment: 55 year old male receiving anticoagulation with Heparin for atrial fibrillation/CP. Heparin level is at goal on 2700 units/hr. Plans noted for cath then begin Xarelto per Cards. Hg wnl, plt low stable 107. No bleed documented  Goal of Therapy:  Heparin level 0.3-0.7 units/ml Monitor platelets by anticoagulation protocol: Yes   Plan:  Heparin at 2700 units/h Daily HL/CBC Monitor s/sx bleeding Cath today - plans for Xarelto post-cath  Elicia Lamp, PharmD, BCPS Clinical Pharmacist Pager [phone] 08/21/2015 8:55 AM

## 2015-08-21 NOTE — Progress Notes (Signed)
   Subjective: Patient feels well this morning without complaints or acute events overnight. No CP, palpitations, or SOB. He is awaiting cardiac cath today.  Objective: Vital signs in last 24 hours: Filed Vitals:   08/20/15 1538 08/20/15 2318 08/21/15 0229 08/21/15 0427  BP: 118/80 136/81  131/85  Pulse: 55 63  74  Temp: 97.8 F (36.6 C) 98.5 F (36.9 C)  98.1 F (36.7 C)  TempSrc: Oral Oral  Oral  Resp: 18   18  Height:      Weight:   331 lb 11.2 oz (150.458 kg)   SpO2: 100% 98%  99%   Weight change: -6.4 oz (-0.181 kg)  Intake/Output Summary (Last 24 hours) at 08/21/15 1126 Last data filed at 08/21/15 1051  Gross per 24 hour  Intake    720 ml  Output   3700 ml  Net  -2980 ml   General: Obese, African [place] gentleman, resting in bed Cardiac: irregular rhythm, regular rate  Pulm: distant sounds, clear to auscultation bilaterally,  Abd: soft, nontender, nondistended, BS present Ext: warm and well perfused, no edema bilateral LEs Neuro: alert and oriented X3  Assessment/Plan: Principal Problem:   Atrial flutter (HCC) Active Problems:   Severe obesity (BMI >= 40) (HCC)   Uncontrolled hypertension   Current smoker   Chest pain   Cocaine abuse   HCV antibody positive  CHF: Likely mediated by arrhythmia, HTN, and adverse effects of cocaine though cannot exclude CAD. Echo 08/18/15 notable for EF 50-55%, severe left and right atrial dilatation, mildly elevated pulmonary artery pressure, and diffuse hypokinesis. Planned left and right heart cath today per cardiology. Down 20 lbs and ~11.4L since admission, appears to be at or near euvolemic state. -Continue IV Lasix 80 mg BID and metolazone 2.5mg  per Cardiology -Continue I&O -Follow-up cardiac cath results  -Cardiology following, appreciate recommendations  Atrial Flutter: Flutter waves seen on EKGs and telemetry. Rates intermittently controlled to tachy. CHADSVASc score is at least 2, on Heparin for A/C.  -Continue  heparin gtt per Cardiology with plan to transition to rivaroxaban 20 mg daily 24 hours following cardiac catheterization -Continue diltiazem 60mg  every 8 hours per Cardiology -Follow-up in 3-4 weeks with cardiology for cardioversion  HTN: BPs ranging from 100s-130s/60s-70s.  -Consider ACEi/ARB pending cardiac workup.  Hepatitis C: HCV antibody is positive. Patient denies history of IVDU or known liver problems though prior history of incarceration.  -HCV RNA 508,000 -Will need outpatient ID follow up  Morbid Obesity: BMI 49. Denies snoring, but does report waking at night with a choking sensation. -Encourage weight loss -Outpatient sleep study  Dispo: Disposition is deferred at this time, awaiting improvement of current medical problems.  Anticipated discharge in approximately 2-3 day(s).   The patient does not have a current PCP (Provider Default, MD) and does need an [facility] hospital follow-up appointment after discharge.     LOS: 3 days   Zada Finders, MD 08/21/2015, 11:26 AM

## 2015-08-21 NOTE — Clinical Documentation Improvement (Signed)
Cardiology Internal Medicine  Can the diagnosis of CHF be further specified?    Acuity - Acute, Chronic, Acute on Chronic   Type - Systolic, Diastolic, Systolic and Diastolic  Other  Clinically Undetermined  Document any associated diagnoses/conditions Please update your documentation within the medical record to reflect your response to this query. Thank you.  Supporting Information:(As per notes) "CHF: There is a good chance that he's been in atrial flutter for the past 5 or 6 months. This may be the cause of his CHF. In addition, this congestive heart failure may be due to his cocaine habit."  Please exercise your independent, professional judgment when responding. A specific answer is not anticipated or expected.   Thank You, Alessandra Grout, RN, BSN, CCDS,Clinical Documentation Specialist:  [phone]  [phone]=[facility]- Health Information Management

## 2015-08-21 NOTE — Interval H&P Note (Signed)
History and Physical Interval Note:  08/21/2015 2:44 PM  Frank Morales  has presented today for surgery, with the diagnosis of aflutter & myopathy with apical hypokinesis. This was in the setting of having chest pain and heart failure symptoms. The various methods of treatment have been discussed with the patient and family. After consideration of risks, benefits and other options for treatment, the patient has consented to  Procedure(s): Right/Left Heart Cath and Coronary Angiography (N/A) with possible percutaneous coronary intervention as a surgical intervention .  The patient's history has been reviewed, patient examined, no change in status, stable for surgery.  I have reviewed the patient's chart and labs.  Questions were answered to the patient's satisfaction.    AUC for R&LHC Cardiomyopathies (Right and Left Heart Catheterization OR  Right Heart Catheterization Alone With/Without Left Ventriculography and Coronary Angiography)   Patient Information:    Known or suspected cardiomyopathy with or without heart failure  AUC Score:   A (7)   Indication:   93  AUC for PCI Ischemic Symptoms? CCS III (Marked limitation of ordinary activity) Anti-ischemic Medical Therapy? Minimal Therapy (1 class of medications) Non-invasive Test Results? Intermediate-risk stress test findings: cardiac mortality 1-3%/year Prior CABG? No Previous CABG   Patient Information:   1-2V CAD, no prox LAD  U (6)  Indication: 16; Score: 6   Patient Information:   CTO of 1 vessel, no other CAD  U (6)  Indication: 26; Score: 6   Patient Information:   1V CAD with prox LAD  A (7)  Indication: 32; Score: 7   Patient Information:   2V-CAD with prox LAD  A (8)  Indication: 38; Score: 8   Patient Information:   3V-CAD without LMCA  A (8)  Indication: 44; Score: 8   Patient Information:   3V-CAD without LMCA With Abnormal LV systolic function  A (9)  Indication: 48; Score:  9   Patient Information:   LMCA-CAD  A (9)  Indication: 49; Score: 9   Patient Information:   2V-CAD with prox LAD PCI  A (7)  Indication: 62; Score: 7   Patient Information:   2V-CAD with prox LAD CABG  A (8)  Indication: 62; Score: 8   Patient Information:   3V-CAD without LMCA With Low CAD burden(i.e., 3 focal stenoses, low SYNTAX score) PCI  A (7)  Indication: 63; Score: 7   Patient Information:   3V-CAD without LMCA With Low CAD burden(i.e., 3 focal stenoses, low SYNTAX score) CABG  A (9)  Indication: 63; Score: 9   Patient Information:   3V-CAD without LMCA E06c - Intermediate-high CAD burden (i.e., multiple diffuse lesions, presence of CTO, or high SYNTAX score) PCI  U (4)  Indication: 64; Score: 4   Patient Information:   3V-CAD without LMCA E06c - Intermediate-high CAD burden (i.e., multiple diffuse lesions, presence of CTO, or high SYNTAX score) CABG  A (9)  Indication: 64; Score: 9   Patient Information:   LMCA-CAD With Isolated LMCA stenosis  PCI  U (6)  Indication: 65; Score: 6   Patient Information:   LMCA-CAD Additional CAD, low CAD burden (i.e., 1- to 2-vessel additional involvement, low SYNTAX score) PCI  U (5)  Indication: 66; Score: 5   Patient Information:   LMCA-CAD Additional CAD, low CAD burden (i.e., 1- to 2-vessel additional involvement, low SYNTAX score) CABG  A (9)  Indication: 66; Score: 9   Patient Information:   LMCA-CAD With Isolated LMCA stenosis  CABG  A (9)  Indication: 66; Score: 9   Patient Information:   LMCA-CAD Additional CAD, intermediate-high CAD burden (i.e., 3-vessel involvement, presence of CTO, or high SYNTAX score) PCI  I (3)  Indication: 67; Score: 3   Patient Information:   LMCA-CAD Additional CAD, intermediate-high CAD burden (i.e., 3-vessel involvement, presence of CTO, or high SYNTAX score) CABG  A (9)  Indication: 67; Score: 9     Frank Morales

## 2015-08-21 NOTE — Progress Notes (Signed)
ANTICOAGULATION CONSULT NOTE - Follow Up Consult  Pharmacy Consult for Heparin Indication: Resuming post-cath for Afib  No Known Allergies  Patient Measurements: Height: 5\' 11"  (180.3 cm) Weight: (!) 331 lb 11.2 oz (150.458 kg) IBW/kg (Calculated) : 75.3 Heparin Dosing Weight: 111 kg  Vital Signs: Temp: 97.7 F (36.5 C) (06/05 1300) Temp Source: Oral (06/05 1300) BP: 125/98 mmHg (06/05 1625) Pulse Rate: 157 (06/05 1625)  Labs:  Recent Labs  08/19/15 0301  08/20/15 0212 08/20/15 1106 08/20/15 1913 08/21/15 0300 08/21/15 0615  HGB 14.7  --  15.0  --   --  15.4  --   HCT 43.4  --  45.0  --   --  45.4  --   PLT 100*  --  105*  --   --  107*  --   LABPROT  --   --   --   --   --   --  14.6  INR  --   --   --   --   --   --  1.12  HEPARINUNFRC 0.26*  < > 0.23* 0.44 0.52  --  0.42  CREATININE 0.83  --  1.05  --   --  1.01  --   < > = values in this interval not displayed.  Estimated Creatinine Clearance: 126.1 mL/min (by C-G formula based on Cr of 1.01).   Medications:  Heparin @ 2700 units/hr pre-cath with therapeutic heparin levels  Assessment: 54 YOM to resume heparin 8 hours post sheath removal for Afib. Sheath noted to be removed at 6/5 @ 1545. Will plan to resume heparin at the previously known therapeutic rate without a bolus. CBC from this AM was stable.   Goal of Therapy:  Heparin level 0.3-0.7 units/ml Monitor platelets by anticoagulation protocol: Yes   Plan:  1. Resume heparin at 2700 units/hr starting at 0000 on 6/6 2. Will continue to monitor for any signs/symptoms of bleeding and will follow up with heparin level in 6 hours after restart  Alycia Rossetti, PharmD, BCPS Clinical Pharmacist Pager: [phone] 08/21/2015 4:55 PM

## 2015-08-21 NOTE — H&P (View-Only) (Signed)
Pt. Profile:  55 y/o morbidly obese male smoker with no significant PMH, and has not been followed routinely by a PCP, admitted by IM for worsening DOE and CP, found to be in atrial flutter.   UDS was + for cocaine   Subjective: Breathing better but can only lie partially flat for periods of time. No pain.    Objective: Vital signs in last 24 hours: Temp:  [97.4 F (36.3 C)-97.6 F (36.4 C)] 97.4 F (36.3 C) (06/04 0604) Pulse Rate:  [71-86] 71 (06/04 0604) BP: (109-128)/(68-79) 126/74 mmHg (06/04 0604) SpO2:  [98 %-99 %] 99 % (06/04 0604) Weight:  [332 lb 1.6 oz (150.64 kg)] 332 lb 1.6 oz (150.64 kg) (06/04 0604) Weight change: -2 lb 8.9 oz (-1.16 kg) Last BM Date: 08/16/15 Intake/Output from previous day: -1670 06/03 0701 - 06/04 0700 In: 1080 [P.O.:1080] Out: 3175 [Urine:3175] Intake/Output this shift:    PE: General:Pleasant affect, NAD Skin:Warm and dry, brisk capillary refill HEENT:normocephalic, sclera clear, mucus membranes moist Heart:irreg irreg  without murmur, gallup, rub or click Lungs: with rales in bases, occ rhonchi, no wheezes [id number], non tender, + BS, do not palpate liver spleen or masses Ext:1-2+ lower ext edema,  2+ radial pulses Neuro:alert and oriented X 3, MAE, follows commands, + facial symmetry Tele:  A flutter one pause 2.29 sec and at times HR to 140  Lab Results:  Recent Labs  08/19/15 0301 08/20/15 0212  WBC 3.8* 4.4  HGB 14.7 15.0  HCT 43.4 45.0  PLT 100* 105*   BMET  Recent Labs  08/19/15 0301 08/20/15 0212  NA 136 134*  K 3.3* 3.7  CL 92* 88*  CO2 36* 36*  GLUCOSE 102* 94  BUN 13 16  CREATININE 0.83 1.05  CALCIUM 9.5 9.6    Recent Labs  08/18/15 0350 08/18/15 1024  TROPONINI 0.04* 0.04*    Lab Results  Component Value Date   CHOL 105 08/17/2015   HDL 28* 08/17/2015   LDLCALC 41 08/17/2015   TRIG 182* 08/17/2015   CHOLHDL 3.8 08/17/2015   Lab Results  Component Value Date   HGBA1C 5.4  08/17/2015     Lab Results  Component Value Date   TSH 1.713 08/17/2015    Hepatic Function Panel  Recent Labs  08/19/15 0301  PROT 7.5  ALBUMIN 3.1*  AST 73*  ALT 70*  ALKPHOS 57  BILITOT 0.6    Recent Labs  08/17/15 1233  CHOL 105   No results for input(s): PROTIME in the last 72 hours.  Lipid Panel     Component Value Date/Time   CHOL 105 08/17/2015 1233   TRIG 182* 08/17/2015 1233   HDL 28* 08/17/2015 1233   CHOLHDL 3.8 08/17/2015 1233   VLDL 36 08/17/2015 1233   LDLCALC 41 08/17/2015 1233      Studies/Results: No results found.  Medications: I have reviewed the patient's current medications. Scheduled Meds: . diltiazem  60 mg Oral Q8H  . furosemide  80 mg Intravenous BID  . metolazone  2.5 mg Oral 2 times per day  . potassium chloride  40 mEq Oral BID   Continuous Infusions: . heparin 2,700 Units/hr (08/20/15 0937)   PRN Meds:.acetaminophen **OR** acetaminophen, senna-docusate  Assessment/Plan: Principal Problem:   Atrial flutter (HCC) Active Problems:   Severe obesity (BMI >= 40) (HCC)   Uncontrolled hypertension   Current smoker   Chest pain   Cocaine abuse   HCV antibody positive    LOS:  2 days  1.CHF:  There is a good chance that he's been in atrial flutter for the past 5 or 6 months.  This may be the cause of his CHF.   In addition, this congestive heart failure may be due to his cocaine habit.   Echocardiogram shows apical akinesis although it is noted that the apex was not well seen. This is likely  due to microvascular disease due to his cocaine abuse.    Will schedule cath on Monday .We did had a long discussion regarding his cocaine habit and I warned him that continued use would likely lead to more cardiac problems.   2. Atrial Flutter: flutter waves present on telemetry. rate is controlled at times and at times 140.  Average HR controlled    . K and TSH are both normal. Mg 1.9. 2D echo pending.  Recommend IV heparin for a/c.    Will start Diltiazem 60 mg Q8 for now.    Rate is well controlled. Continue heparin for now.   Can start Xarelto 20 mg a day after his cath ( would wait a day to allow the cath sites to heal). He will be following up with Dr. Harrington Challenger .   We can do cardioversion in 3-4 weeks   I've told him that continued cocaine use will likely lead to more cardiac problems.   3. CP: patient notes h/o SSCP/ tightness occuring at rest and with exertion on admit. Last episode of CP was yesterday. troponins 0.04  Flat  No trend     4. HTN: poorly controlled. BP 159/53 to 135/104 . Low sodium diet.   5. Obesity: given body habitus and atrial arrhthymias, there is concern for possible OSA. Recommend a sleep study, however this can be done as an OP.   8. HCV ab + > 11   Time spent 25 minutes, > half of that face to face.     Mertie Moores, MD  08/20/2015 11:05 AM    [facility] [place],  [place], [place]  [address] Pager [phone] Phone: [phone]; Fax: [phone]

## 2015-08-21 NOTE — Progress Notes (Signed)
Patient Name: Frank Morales Date of Encounter: 08/21/2015     Principal Problem:   Atrial flutter ([place]) Active Problems:   Severe obesity (BMI >= 40) (HCC)   Uncontrolled hypertension   Current smoker   Chest pain   Cocaine abuse   HCV antibody positive    SUBJECTIVE  No CP. Feeling much better in terms of breathing. Waiting for heart cath.   CURRENT MEDS . diltiazem  60 mg Oral Q8H  . [START ON 08/22/2015] furosemide  80 mg Intravenous BID  . [START ON 08/22/2015] metolazone  2.5 mg Oral 2 times per day  . off the beat book   Does not apply Once  . potassium chloride  40 mEq Oral BID  . sodium chloride flush  3 mL Intravenous Q12H    OBJECTIVE  Filed Vitals:   08/20/15 1538 08/20/15 2318 08/21/15 0229 08/21/15 0427  BP: 118/80 136/81  131/85  Pulse: 55 63  74  Temp: 97.8 F (36.6 C) 98.5 F (36.9 C)  98.1 F (36.7 C)  TempSrc: Oral Oral  Oral  Resp: 18   18  Height:      Weight:   331 lb 11.2 oz (150.458 kg)   SpO2: 100% 98%  99%    Intake/Output Summary (Last 24 hours) at 08/21/15 0909 Last data filed at 08/21/15 0819  Gross per 24 hour  Intake    720 ml  Output   3900 ml  Net  -3180 ml   Filed Weights   08/19/15 0436 08/20/15 0604 08/21/15 0229  Weight: 334 lb 10.5 oz (151.8 kg) 332 lb 1.6 oz (150.64 kg) 331 lb 11.2 oz (150.458 kg)    PHYSICAL EXAM  General: Pleasant, NAD. obese Neuro: Alert and oriented X 3. Moves all extremities spontaneously. Psych: Normal affect. HEENT:  Normal  Neck: Supple without bruits or JVD. Lungs:  Resp regular and unlabored, CTA. Heart: irreg irreg no s3, s4, or murmurs. Abdomen: Soft, non-tender, non-distended, BS + x 4.  Extremities: No clubbing, cyanosis or edema. DP/PT/Radials 2+ and equal bilaterally.  Accessory Clinical Findings  CBC  Recent Labs  08/20/15 0212 08/21/15 0300  WBC 4.4 5.0  HGB 15.0 15.4  HCT 45.0 45.4  MCV 91.5 89.9  PLT 105* [id number]*   Basic Metabolic Panel  Recent Labs  08/20/15 0212 08/21/15 0300  NA 134* 131*  K 3.7 5.2*  CL 88* 87*  CO2 36* 32  GLUCOSE 94 115*  BUN 16 25*  CREATININE 1.05 1.01  CALCIUM 9.6 9.4  MG 2.0 2.1   Liver Function Tests  Recent Labs  08/19/15 0301  AST 73*  ALT 70*  ALKPHOS 57  BILITOT 0.6  PROT 7.5  ALBUMIN 3.1*   No results for input(s): LIPASE, AMYLASE in the last 72 hours. Cardiac Enzymes  Recent Labs  08/18/15 1024  TROPONINI 0.04*    TELE  atrial flutter with CVR  Radiology/Studies  X-ray Chest Pa And Lateral  08/17/2015  CLINICAL DATA:  Shortness of breath for 3 days with lower extremity swelling EXAM: CHEST  2 VIEW COMPARISON:  None. FINDINGS: Severe cardiac silhouette enlargement. Cephalization of the pulmonary vasculature. Azygos vein distension. No evidence of pulmonary edema consolidation or effusion. IMPRESSION: Severe cardiac silhouette enlargement with vascular congestion. No definite evidence of pulmonary edema. Electronically Signed   By: Skipper Cliche M.D.   On: 08/17/2015 14:03   2D ECHO: 08/18/2015 LV EF: 50% - 55% Study Conclusions - Left ventricle: Difficult acoustical windows  make assessment of  wall motion difficult even with definity. Study is suggestive of  apical hypokinesis. The cavity size was normal. There was  moderate focal basal and mild concentric hypertrophy. Systolic  function was normal. The estimated ejection fraction was in the  range of 50% to 55%. Diffuse hypokinesis. The study was not  technically sufficient to allow evaluation of LV diastolic  dysfunction due to atrial fibrillation. - Aortic valve: Trileaflet; mildly thickened, mildly calcified  leaflets. - Mitral valve: There was trivial regurgitation. - Left atrium: The atrium was severely dilated. - Right atrium: The atrium was severely dilated. - Tricuspid valve: There was mild regurgitation. - Pulmonary arteries: PA peak pressure: 37 mm Hg (S). Impressions: - The right ventricular  systolic pressure was increased consistent  with mild pulmonary hypertension.   ASSESSMENT AND PLAN  55 y/o morbidly obese male smoker with no significant PMH, and has not been followed routinely by a PCP, who was admitted on 08/17/15 with worsening DOE and CP. found to be in atrial flutter.   New onset acute CHF: 2D ECHO shows low normal LV function with apical akinesis although it is noted that the apex was not well seen. This was felt to be likely due to microvascular disease due to his cocaine abuse.Dr Acie Fredrickson felt that there is a good chance that he's been in atrial flutter for the past 5 or 6 months, which could also be the cause of his CHF.Currently on IV lasix 80mg  BID and metolazone 2.5mg  BID. Net neg 10.9L. I think he is euvolemic but difficult with body habitus. Will add right heart catheterization to his LCP today to get a good idea of his volume status.   Newly diagnosed atrial flutter: rate is controlled at times  -- K and TSH are both normal. Mg 1.9. 2D echo with normal LV function, apical HK, diffuse HK and severe LA dilation.  -- Rate well controlled on Diltiazem 60 mg Q8 for now.Can convert to long acting when closer to discharge. -- CHADSVASC score of at least 2 (CHF, HTN). Continue heparin gtt for now. Can start Xarelto 20 mg a day after his cath ( would wait a day to allow the cath sites to heal). -- Plan would be for DCCV in 3-4 weeks   CP: patient notes h/o SSCP/ tightness occuring at rest and with exertion on admit. Last episode of CP was 07/19/15. troponins 0.04and flat with no trend. Plan is for cath today.   HTN: BP better controlled currently.   Obesity: given body habitus and atrial arrhthymias, there is concern for possible OSA. Recommend a sleep study, however this can be done as an OP.   New diagnosis hepatitis C: Liver function abnormalities, Leukopenia and thrombocytopenia may be related to this. Further evaluation once we sort out his acute cardiac  issues.  Positive urine drug screen for cocaine: patient denied IV drug use or prior transfusions.  Hyperkalemia: will stop K supplementation for now.   Tobacco abuse: counseled on cessation.   Hyponatremia: Na 131. Continue to monitor  Signed, Angelena Form PA-C  Pager [phone]

## 2015-08-22 ENCOUNTER — Encounter (HOSPITAL_COMMUNITY): Payer: Self-pay | Admitting: Cardiology

## 2015-08-22 DIAGNOSIS — I5033 Acute on chronic diastolic (congestive) heart failure: Secondary | ICD-10-CM

## 2015-08-22 LAB — CBC
HCT: 43.7 % (ref 39.0–52.0)
HEMOGLOBIN: 14.7 g/dL (ref 13.0–17.0)
MCH: 30.9 pg (ref 26.0–34.0)
MCHC: 33.6 g/dL (ref 30.0–36.0)
MCV: 92 fL (ref 78.0–100.0)
Platelets: 99 10*3/uL — ABNORMAL LOW (ref 150–400)
RBC: 4.75 MIL/uL (ref 4.22–5.81)
RDW: 13.4 % (ref 11.5–15.5)
WBC: 4 10*3/uL (ref 4.0–10.5)

## 2015-08-22 LAB — BASIC METABOLIC PANEL
Anion gap: 11 (ref 5–15)
BUN: 14 mg/dL (ref 6–20)
CHLORIDE: 93 mmol/L — AB (ref 101–111)
CO2: 29 mmol/L (ref 22–32)
CREATININE: 0.74 mg/dL (ref 0.61–1.24)
Calcium: 9.5 mg/dL (ref 8.9–10.3)
GFR calc Af Amer: >60 mL/min (ref >60)
GFR calc non Af Amer: >60 mL/min (ref >60)
GLUCOSE: 129 mg/dL — AB (ref 65–99)
POTASSIUM: 3.6 mmol/L (ref 3.5–5.1)
SODIUM: 133 mmol/L — AB (ref 135–145)

## 2015-08-22 LAB — HEPARIN LEVEL (UNFRACTIONATED): HEPARIN UNFRACTIONATED: 0.14 [IU]/mL — AB (ref 0.30–0.70)

## 2015-08-22 MED ORDER — DILTIAZEM HCL ER COATED BEADS 240 MG PO CP24
240.0000 mg | ORAL_CAPSULE | Freq: Every day | ORAL | Status: DC
  Administered 2015-08-22: 240 mg via ORAL
  Filled 2015-08-22: qty 1

## 2015-08-22 MED ORDER — RIVAROXABAN 20 MG PO TABS: 20.0000 mg | ORAL_TABLET | Freq: Every day | ORAL | Status: DC

## 2015-08-22 MED ORDER — FUROSEMIDE 40 MG PO TABS: 40.0000 mg | ORAL_TABLET | Freq: Every day | ORAL | Status: DC

## 2015-08-22 MED ORDER — DILTIAZEM HCL ER COATED BEADS 240 MG PO CP24: 240.0000 mg | ORAL_CAPSULE | Freq: Every day | ORAL | Status: DC

## 2015-08-22 MED ORDER — HEPARIN BOLUS VIA INFUSION
3000.0000 [IU] | Freq: Once | INTRAVENOUS | Status: AC
  Administered 2015-08-22: 3000 [IU] via INTRAVENOUS
  Filled 2015-08-22: qty 3000

## 2015-08-22 NOTE — Progress Notes (Signed)
ANTICOAGULATION CONSULT NOTE - Follow Up Consult  Pharmacy Consult for Heparin Indication: Resuming post-cath for Afib  No Known Allergies  Patient Measurements: Height: 5\' 11"  (180.3 cm) Weight: (!) 331 lb (150.141 kg) IBW/kg (Calculated) : 75.3 Heparin Dosing Weight: 111 kg  Vital Signs: Temp: 98.1 F (36.7 C) (06/06 0443) Temp Source: Oral (06/06 0443) BP: 127/85 mmHg (06/06 0443) Pulse Rate: 98 (06/06 0443)  Labs:  Recent Labs  08/20/15 0212  08/20/15 1913 08/21/15 0300 08/21/15 0615 08/22/15 0525  HGB 15.0  --   --  15.4  --  14.7  HCT 45.0  --   --  45.4  --  43.7  PLT 105*  --   --  107*  --  99*  LABPROT  --   --   --   --  14.6  --   INR  --   --   --   --  1.12  --   HEPARINUNFRC 0.23*  < > 0.52  --  0.42 0.14*  CREATININE 1.05  --   --  1.01  --  0.74  < > = values in this interval not displayed.  Estimated Creatinine Clearance: 158.9 mL/min (by C-G formula based on Cr of 0.74).  Assessment: 70 YOM resumed heparin 8 hours post sheath removal for Afib. Sheath noted to be removed at 6/5 @ 1545. Heparin resumed at  0105 am at 2700 units/hr HL drawn at 0525 low at 0.14, ~ 4 hr level, not steady state level.  D/w RN, no problems noted.  Hg stable at 14.7, pltc remains low at 99.  No bleeding reported.   Goal of Therapy:  Heparin level 0.3-0.7 units/ml Monitor platelets by anticoagulation protocol: Yes   Plan:   bolus 3000 units, incr to 2800 units/hr and check 6 hr HL  continue to monitor for any signs/symptoms of bleeding  Eudelia Bunch, Pharm.D. BP:[id number] 08/22/2015 7:32 AM

## 2015-08-22 NOTE — Progress Notes (Signed)
F/u scheduled 6/23 with Dr. Harrington Challenger, placed in d/c f/u section Denzal Meir PA-C

## 2015-08-22 NOTE — Progress Notes (Addendum)
PATIENT ID:  [id number] with new onset atrial flutter, hypertension and newly diagnosed HCV.   SUBJECTIVE:  Feeling well.  Denies chest pain, palpitations or shortness of breath.    PHYSICAL EXAM Filed Vitals:   08/21/15 1746 08/21/15 2005 08/22/15 0443 08/22/15 1217  BP: 117/87 96/62 127/85 129/82  Pulse:  82 98 124  Temp:  98.3 F (36.8 C) 98.1 F (36.7 C) 98.8 F (37.1 C)  TempSrc:  Oral Oral Oral  Resp:  20 20 21   Height:      Weight:   331 lb (150.141 kg)   SpO2: 98% 98% 96% 100%   General:  Well-appearing.  No acute distress Neck: No JVD Lungs:  CTAB Heart: Irregularly irregular.  No m/r/g.  Abdomen:  Soft, NT, ND.  +BS  Extremities:  No pitting edema   LABS: Lab Results  Component Value Date   TROPONINI 0.04* 08/18/2015   Results for orders placed or performed during the hospital encounter of 08/17/15 (from the past 24 hour(s))  I-STAT 3, arterial blood gas (G3+)     Status: Abnormal   Collection Time: 08/21/15  3:18 PM  Result Value Ref Range   pH, Arterial 7.438 7.350 - 7.450   pCO2 arterial 48.2 (H) 35.0 - 45.0 mmHg   pO2, Arterial 60.0 (L) 80.0 - 100.0 mmHg   Bicarbonate 32.6 (H) 20.0 - 24.0 mEq/L   TCO2 34 0 - 100 mmol/L   O2 Saturation 91.0 %   Acid-Base Excess 7.0 (H) 0.0 - 2.0 mmol/L   Patient temperature HIDE    Sample type ARTERIAL   I-STAT 3, venous blood gas (G3P V)     Status: Abnormal   Collection Time: 08/21/15  3:22 PM  Result Value Ref Range   pH, Ven 7.408 (H) 7.250 - 7.300   pCO2, Ven 57.5 (H) 45.0 - 50.0 mmHg   pO2, Ven 31.0 31.0 - 45.0 mmHg   Bicarbonate 36.3 (H) 20.0 - 24.0 mEq/L   TCO2 38 0 - 100 mmol/L   O2 Saturation 59.0 %   Acid-Base Excess 9.0 (H) 0.0 - 2.0 mmol/L   Patient temperature HIDE    Sample type VENOUS    Comment NOTIFIED PHYSICIAN   I-STAT 3, venous blood gas (G3P V)     Status: Abnormal   Collection Time: 08/21/15  3:26 PM  Result Value Ref Range   pH, Ven 7.407 (H) 7.250 - 7.300   pCO2, Ven 57.5 (H) 45.0 -  50.0 mmHg   pO2, Ven 31.0 31.0 - 45.0 mmHg   Bicarbonate 36.2 (H) 20.0 - 24.0 mEq/L   TCO2 38 0 - 100 mmol/L   O2 Saturation 58.0 %   Acid-Base Excess 9.0 (H) 0.0 - 2.0 mmol/L   Patient temperature HIDE    Sample type VENOUS   POCT Activated clotting time     Status: None   Collection Time: 08/21/15  3:42 PM  Result Value Ref Range   Activated Clotting Time 236 seconds  POCT Activated clotting time     Status: None   Collection Time: 08/21/15  4:20 PM  Result Value Ref Range   Activated Clotting Time 197 seconds  CBC     Status: Abnormal   Collection Time: 08/22/15  5:25 AM  Result Value Ref Range   WBC 4.0 4.0 - 10.5 K/uL   RBC 4.75 4.22 - 5.81 MIL/uL   Hemoglobin 14.7 13.0 - 17.0 g/dL   HCT 43.7 39.0 - 52.0 %   MCV 92.0  78.0 - 100.0 fL   MCH 30.9 26.0 - 34.0 pg   MCHC 33.6 30.0 - 36.0 g/dL   RDW 13.4 11.5 - 15.5 %   Platelets 99 (L) 150 - 400 K/uL  Basic metabolic panel     Status: Abnormal   Collection Time: 08/22/15  5:25 AM  Result Value Ref Range   Sodium 133 (L) 135 - 145 mmol/L   Potassium 3.6 3.5 - 5.1 mmol/L   Chloride 93 (L) 101 - 111 mmol/L   CO2 29 22 - 32 mmol/L   Glucose, Bld 129 (H) 65 - 99 mg/dL   BUN 14 6 - 20 mg/dL   Creatinine, Ser 0.74 0.61 - 1.24 mg/dL   Calcium 9.5 8.9 - 10.3 mg/dL   GFR calc non Af Amer >60 >60 mL/min   GFR calc Af Amer >60 >60 mL/min   Anion gap 11 5 - 15  Heparin level (unfractionated)     Status: Abnormal   Collection Time: 08/22/15  5:25 AM  Result Value Ref Range   Heparin Unfractionated 0.14 (L) 0.30 - 0.70 IU/mL    Intake/Output Summary (Last 24 hours) at 08/22/15 1221 Last data filed at 08/22/15 0830  Gross per 24 hour  Intake    480 ml  Output   1625 ml  Net  -1145 ml    Telemetry: Rates 80s-105.  Atrial fibrillation and atrial flutter.   ASSESSMENT AND PLAN:  Principal Problem:   Typical atrial flutter (HCC) Active Problems:   Severe obesity (BMI >= 40) (HCC)   Uncontrolled hypertension   Current  smoker   Chest pain   Cocaine abuse   HCV antibody positive   Atrial flutter ([place])   # Atrial flutter: Frank Morales remains in atrial fibrillation/flutter.  He is asymptomatic and rates are relatively well-controlled.  We will consolidate diltiazem and increase daily dose from 180 to 240 mg for improved rate control.  OK to start Xarelto 20 mg daily tonight.   # Acute on chronic diastolic heart failure:  Frank Morales is -15L since admission.  LVEF 50-55%.  Continue lasix 40 mg po daily.  Continue diltiazem as above.  We will arrange outpatient follow up with cardiology.   Frank Morales C. Oval Linsey, MD, [facility] 08/22/2015 12:21 PM

## 2015-08-22 NOTE — Care Management Note (Addendum)
Case Management Note Marvetta Gibbons RN, BSN Unit 2W-Case Manager [phone]  Patient Details  Name: Frank Morales MRN: [id number] Date of Birth: [birth year]  Subjective/Objective:   Pt admitted aflutter,                 Action/Plan: PTA pt lived at home, referral for Xarelto- pt does not have insurance- would need to apply for assistance- could use 30 day free card on discharge- CM to see pt for further d/c needs.   Expected Discharge Date:   08/22/15               Expected Discharge Plan:  Home/Self Care  In-House Referral:     Discharge planning Services  CM Consult, Medication Assistance  Post Acute Care Choice:    Choice offered to:     DME Arranged:    DME Agency:     HH Arranged:    HH Agency:     Status of Service:  In process, will continue to follow  Medicare Important Message Given:    Date Medicare IM Given:    Medicare IM give by:    Date Additional Medicare IM Given:    Additional Medicare Important Message give by:     If discussed at [place] of Stay Meetings, dates discussed:    Additional Comments:  08/22/15- [id number]- update- pt for d/c home today- has been started on Xarelto- spoke with pt at bedside- plans to f/u with [facility] outpt clinic- pt given 30 day free card for Xarelto also given pt application for further assistance- pt to fill out his part, gather needed info to send along with application and take along to f/u appointment to have MD fill out and fax. Pt has 2 other scripts that should cost around $35 total- pt reports that he should be able to handle getting these medications on discharge. No further CM needs noted.   Dawayne Patricia, RN 08/22/2015, 10:00 AM

## 2015-08-22 NOTE — Discharge Instructions (Addendum)
Mr. Hodgman, please take the following medications as prescribed:  Lasix (Furosemide) 40 mg by mouth daily. Xarelto (Rivaroxaban) 20 mg by mouth daily. Diltiazem (Cardizem) 240 mg by mouth daily.  Please follow up with the Internal Medicine Clinic on 08/30/15. Please also ask about a referral to the Infectious Disease Clinic on follow up. Also follow up with Cardiology on 09/08/15.    Information on my medicine - XARELTO (Rivaroxaban)  This medication education was reviewed with me or my healthcare representative as part of my discharge preparation.  The pharmacist that spoke with me during my hospital stay was:  Romona Curls, [facility]  Why was Xarelto prescribed for you? Xarelto was prescribed for you to reduce the risk of a blood clot forming that can cause a stroke if you have a medical condition called atrial fibrillation (a type of irregular heartbeat).  What do you need to know about xarelto ? Take your Xarelto ONCE DAILY at the same time every day with your evening meal. If you have difficulty swallowing the tablet whole, you may crush it and mix in applesauce just prior to taking your dose.  Take Xarelto exactly as prescribed by your doctor and DO NOT stop taking Xarelto without talking to the doctor who prescribed the medication.  Stopping without other stroke prevention medication to take the place of Xarelto may increase your risk of developing a clot that causes a stroke.  Refill your prescription before you run out.  After discharge, you should have regular check-up appointments with your healthcare provider that is prescribing your Xarelto.  In the future your dose may need to be changed if your kidney function or weight changes by a significant amount.  What do you do if you miss a dose? If you are taking Xarelto ONCE DAILY and you miss a dose, take it as soon as you remember on the same day then continue your regularly scheduled once daily regimen the next day. Do not  take two doses of Xarelto at the same time or on the same day.   Important Safety Information A possible side effect of Xarelto is bleeding. You should call your healthcare provider right away if you experience any of the following: ? Bleeding from an injury or your nose that does not stop. ? Unusual colored urine (red or dark brown) or unusual colored stools (red or black). ? Unusual bruising for unknown reasons. ? A serious fall or if you hit your head (even if there is no bleeding).  Some medicines may interact with Xarelto and might increase your risk of bleeding while on Xarelto. To help avoid this, consult your healthcare provider or pharmacist prior to using any new prescription or non-prescription medications, including herbals, vitamins, non-steroidal anti-inflammatory drugs (NSAIDs) and supplements.  This website has more information on Xarelto: [email or web address]

## 2015-08-22 NOTE — Progress Notes (Addendum)
   Subjective: Patient without complaints or acute events overnight. No CP, palpitations, or SOB. He feels well.   Objective: Vital signs in last 24 hours: Filed Vitals:   08/21/15 1732 08/21/15 1746 08/21/15 2005 08/22/15 0443  BP: 113/86 117/87 96/62 127/85  Pulse:   82 98  Temp:   98.3 F (36.8 C) 98.1 F (36.7 C)  TempSrc:   Oral Oral  Resp:   20 20  Height:      Weight:    331 lb (150.141 kg)  SpO2: 100% 98% 98% 96%   Weight change: -11.2 oz (-0.318 kg)  Intake/Output Summary (Last 24 hours) at 08/22/15 1121 Last data filed at 08/22/15 0830  Gross per 24 hour  Intake    480 ml  Output   1625 ml  Net  -1145 ml   General: Obese, African [place] gentleman, sitting up in bed Cardiac: irregular rhythm, regular rate  Pulm: distant sounds, clear to auscultation bilaterally Abd: soft, nontender Ext: warm and well perfused, no edema bilateral LEs Neuro: alert and oriented X3  Assessment/Plan: Principal Problem:   Typical atrial flutter (HCC) Active Problems:   Severe obesity (BMI >= 40) (HCC)   Uncontrolled hypertension   Current smoker   Chest pain   Cocaine abuse   HCV antibody positive   Atrial flutter (HCC)  CHF: Likely mediated by arrhythmia, HTN, and adverse effects of cocaine.. Echo 08/18/15 notable for EF 50-55%, severe left and right atrial dilatation, mildly elevated pulmonary artery pressure, and diffuse hypokinesis. Cardiac cath showed minimal CAD, mildly elevated secondary pulmonary HTN. Patient appears euvolemic now and is asymptomatic. -Transition to oral Lasix 40 mg po daily, close outpatient follow up for dosing adjustment -Cardiology following, appreciate recommendations  Atrial Flutter: Flutter waves seen on EKGs and telemetry. Rates controlled. CHADSVASc score is at least 2, on Heparin for A/C. Will transition to Rivaroxaban on discharge. -Transition to rivaroxaban 20 mg daily 24 hours following cardiac catheterization -Continue diltiazem 60mg  every  8 hours per Cardiology -Follow-up in 3-4 weeks with cardiology for cardioversion  HTN: Normotensive now with diuresis and weight loss.  Hepatitis C: HCV antibody is positive. Patient denies history of IVDU or known liver problems though prior history of incarceration.  -HCV RNA 508,000 -Will need outpatient ID follow up  Morbid Obesity: BMI 49. Denies snoring, but does report waking at night with a choking sensation. -Encourage weight loss -Outpatient sleep study  Dispo:  Anticipated discharge today pending further Cardiology recommendations. Follow up outpatient with cardiology, [facility], and Infectious Disease.  The patient does not have a current PCP (Provider Default, MD) and does need an [facility] hospital follow-up appointment after discharge.     LOS: 4 days   Zada Finders, MD 08/22/2015, 11:21 AM

## 2015-08-23 NOTE — Discharge Summary (Signed)
Name: Frank Morales MRN: [id number] DOB: [birth year] 55 y.o. PCP: Provider Default, MD  Date of Admission: 08/17/2015 10:44 AM Date of Discharge: 08/22/2015 Attending Physician: Annia Belt, MD  Discharge Diagnosis: 1. CHF Principal Problem:   Typical atrial flutter (HCC) Active Problems:   Severe obesity (BMI >= 40) (HCC)   Uncontrolled hypertension   Current smoker   Chest pain   Cocaine abuse   HCV antibody positive   Atrial flutter ([place])  Discharge Medications:   Medication List    TAKE these medications        diltiazem 240 MG 24 hr capsule  Commonly known as:  CARDIZEM CD  Take 1 capsule (240 mg total) by mouth daily.     furosemide 40 MG tablet  Commonly known as:  LASIX  Take 1 tablet (40 mg total) by mouth daily.     rivaroxaban 20 MG Tabs tablet  Commonly known as:  XARELTO  Take 1 tablet (20 mg total) by mouth daily with supper.        Disposition and follow-up:   [name] was discharged from [facility] in Stable condition.  At the hospital follow up visit please address:  1.  CHF: Patient's weight stable (~331 lbs), compliant with medications, cocaine abstinencce. Consider adjustment in Lasix dosing if needed.  Atrial flutter: Assess rate control on Diltiazem and compliance with Xarelto for anticoagulation. Is to follow up with cardiology 3-4 weeks post-discharge for planned cardioversion.  HCV: Will need ID referral for possible treatment.  HTN: Assess BP control with reduced weight and lasix.  Presumed OSA: Consider sleep study referral  2.  Labs / imaging needed at time of follow-up: none  3.  Pending labs/ test needing follow-up: none  Follow-up Appointments: Follow-up Information    Follow up with Dellia Nims, MD On 08/30/2015.   Specialty:  Internal Medicine   Why:  At 10:45 am.   Contact information:   [address] [place] [address] [phone]       Follow up with Dorris Carnes, MD.   Specialty:  Cardiology   Why:  [facility] - 09/08/15 at 1:45pm   Contact information:   [address] [place] [address] [phone]       Discharge Instructions: Discharge Instructions    ([place]) Call MD:  Anytime you have any of the following symptoms: 1) 3 pound weight gain in 24 hours or 5 pounds in 1 week 2) shortness of breath, with or without a dry hacking cough 3) swelling in the hands, feet or stomach 4) if you have to sleep on extra pillows at night in order to breathe.    Complete by:  As directed      Call MD for:  difficulty breathing, headache or visual disturbances    Complete by:  As directed      Call MD for:  extreme fatigue    Complete by:  As directed      Call MD for:  persistant dizziness or light-headedness    Complete by:  As directed      Call MD for:  persistant nausea and vomiting    Complete by:  As directed      Call MD for:  temperature >100.4    Complete by:  As directed      Diet - low sodium heart healthy    Complete by:  As directed      Increase  activity slowly    Complete by:  As directed            Consultations: Treatment Team:  Rounding Lbcardiology, MD  Procedures Performed:  X-ray Chest Pa And Lateral  08/17/2015  CLINICAL DATA:  Shortness of breath for 3 days with lower extremity swelling EXAM: CHEST  2 VIEW COMPARISON:  None. FINDINGS: Severe cardiac silhouette enlargement. Cephalization of the pulmonary vasculature. Azygos vein distension. No evidence of pulmonary edema consolidation or effusion. IMPRESSION: Severe cardiac silhouette enlargement with vascular congestion. No definite evidence of pulmonary edema. Electronically Signed   By: Skipper Cliche M.D.   On: 08/17/2015 14:03    2D Echo: TTE 08/18/15 - Left ventricle: Difficult acoustical windows make assessment of  wall motion difficult even with definity. Study is suggestive of  apical hypokinesis. The cavity size was normal. There  was  moderate focal basal and mild concentric hypertrophy. Systolic  function was normal. The estimated ejection fraction was in the  range of 50% to 55%. Diffuse hypokinesis. The study was not  technically sufficient to allow evaluation of LV diastolic  dysfunction due to atrial fibrillation. - Aortic valve: Trileaflet; mildly thickened, mildly calcified  leaflets. - Mitral valve: There was trivial regurgitation. - Left atrium: The atrium was severely dilated. - Right atrium: The atrium was severely dilated. - Tricuspid valve: There was mild regurgitation. - Pulmonary arteries: PA peak pressure: 37 mm Hg (S).  Impressions:  - The right ventricular systolic pressure was increased consistent  with mild pulmonary hypertension  Cardiac Cath: Left and Right Heart Cath 08/21/15  Angiographically minimal coronary artery disease. Large draping vessels. Several very tortuous  Mildly elevated secondary pulmonary hypertension  Right Heart Pressures Hemodynamic findings consistent with mild pulmonary hypertension. Secondary Elevated LV EDP consistent with volume overload. LVEDP 21 mmHg, PCWP 19 mmHg    Admission HPI:  Frank Morales is a 55 y.o. male who presents to the [facility] for worsening dyspnea. He reports that he has been short of breath for at least the last 6 months if not longer. He is unable to walk more than 1 block without getting fatigued and short of breath. Also reports dyspnea and cough when lying flat and has been sleeping in a chair. States he also gets "choked" at night. He endorses occasional sharp right sided chest pain that mainly occurs with exertion. Denies palpitations but does endorse some lightheadedness and bilateral lower extremity swelling. 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 He has not been to see a physician since the 1990s. He smokes about 1 pack per week since high school and drinks  about a 6 pack of beer per week. Denies any recreational drug use or OTC medications except for the occasional cough syrup. He denies any FH of heart problems. Of note, he does report a syncopal episode about 3-4 years ago.   In the [facility], EKG showed atrial flutter with a HR of 102. He was asymptomatic at the time. BP of 148/101, O2 sat 98% RA, and wt of 353 lbs (BMI 49).   Hospital Course by problem list: Principal Problem:   Typical atrial flutter ([place]) Active Problems:   Severe obesity (BMI >= 40) (HCC)   Uncontrolled hypertension   Current smoker   Chest pain   Cocaine abuse   HCV antibody positive   Atrial flutter (HCC)   CHF: Differentials for etiology included arrythmia, NICM,  OSA, cocaine cardiomyopathy, and amyloidosis. Patient was begun on aggressive IV diuresis with weight reduction from 353 lbs to 331 lbs on discharge. TTE revealed EF 50-55% with diffuse hypokinesis. Cardiac catheterization showed minimal CAD. Cardiology felt this was related to cocaine use and discussed this at length with patient. Patient was asymptomatic on discharge.  Atrial Flutter: Patient was started on oral diltiazem for rate control and hope for conversion to normal sinus rhythm. He remained in atrial flutter, however rate was well controlled <110 for majority of admission with occasional jump to 140s. He was anticoagulated with heparin and started on oral Xarelto on discharge 24 hours post-cath. It was thought that he had been in atrial flutter for a prolonged period of time and decision for cardioversion was deferred to 3-4 weeks post-discharge.  HCV: Newly diagnosed with HCV antibody positive with subsequent RNA elevated at 508,000. Will need ID referral outpatient. Patient denied prior IVDU or blood transfusions. He reported brief period of incarceration.  Discharge Vitals:   BP 129/82 mmHg  Pulse 124  Temp(Src) 98.8 F (37.1 C) (Oral)  Resp 21  Ht 5\' 11"  (1.803 m)  Wt 331 lb (150.141 kg)   BMI 46.19 kg/m2  SpO2 100%  Discharge Labs:  No results found for this or any previous visit (from the past 24 hour(s)).  Signed: Zada Finders, MD 08/23/2015, 7:28 PM    Services Ordered on Discharge: none Equipment Ordered on Discharge: none

## 2015-08-23 NOTE — Progress Notes (Signed)
Patient ID: Frank Morales, male   DOB: 03-11-1961, 55 y.o.   MRN: [id number] Medicine attending discharge note: I personally examined this patient on the day of discharge and I tested the accuracy the discharge evaluation and plan as recorded in the daily progress note by resident physician Dr. Zada Finders on the day of discharge 08/22/2015.  Clinical summary: 68 year old morbidly obese man who is a stranger to medical care and has not seen a doctor in years. He presented with a six-month history of progressive dyspnea and then increasing orthopnea and paroxysmal nocturnal dyspnea. No significant chest pain. He denied palpitations. He has noted increased swelling of his lower extremities with some blistering of the skin. He presented to our outpatient clinic with these complaints. A cardiogram showed atrial flutter with approximate 2-1 block at a rate of 102. Blood pressure 148/101. Oxygen saturation 98% on room air. Weight 353 pounds, BMI 49. He was admitted for further evaluation. A chest radiograph which I personally reviewed, shows cardiomegaly without infiltrate or effusion. No significant cephalization of the vessels in my opinion. No gross signs of congestive failure. He did feel better after receiving a parenteral diuretic.  Hospital course: There is no evidence for acute myocardial injury. Troponin levels undetectable. There was a mild elevation of BNP at 252 with no baseline for comparison. Screening laboratory testing revealed positive urine drug screen for cocaine and positive serum screen for hepatitis C. Differential considerations to explain his arrhythmias included nonischemic cardiomyopathy related to obesity obstructive sleep apnea syndrome, cocaine cardiomyopathy, or infiltrative cardiomyopathy such as amyloidosis. He was observed on cardiac monitor. Parenteral diuretics continued. He was seen in consultation by cardiology. Cardizem was initiated to see if his arrhythmia would  respond to medical therapy. An echocardiogram done June 2 showed normal systolic function estimated ejection fraction 50-55 percent with diffuse hypokinesis, mild concentric hypertrophy. Severe dilation of the left atrium and right atrium. Mild tricuspid valve regurgitation. Pulmonary artery pressure 37 mm.  He was taken to cardiac catheterization on June 5 done by a right radial artery approach. Findings were minimal coronary artery disease limited to minimal luminal irregularities in the first diagonal branch of the LAD. Left ventricular end-diastolic pressure 21 mm. Pulmonary capillary wedge pressure 19 mm. Pulmonary artery systolic pressure 45 mm consistent with mild pulmonary hypertension. He remained in atrial flutter with a 2-3: 1 block with heart rate generally less than 90/m. Decision on cardioversion was deferred to further outpatient evaluation and 3-4 weeks by cardiology. Cardiologist's impression was that this was likely cocaine related to cardiac disease. He spent a long time discussing this  with the patient.   Disposition: Condition stable at time of discharge Follow-up with cardiology, infectious disease for new diagnosis of hepatitis C, follow-up in Gen. medicine clinic for hypertension. He will need a sleep study evaluation for presumed OSA. There were no complications

## 2015-08-25 ENCOUNTER — Telehealth: Payer: Self-pay | Admitting: *Deleted

## 2015-08-25 MED FILL — CARTIA XT 240 MG CAPSULE: 240 | 30 days supply | Qty: 30 | Fill #0

## 2015-08-29 ENCOUNTER — Telehealth: Payer: Self-pay | Admitting: Internal Medicine

## 2015-08-29 NOTE — Telephone Encounter (Signed)
Pt need TOC Discharge date 08/22/15 HFU 08/30/15

## 2015-08-30 ENCOUNTER — Encounter: Payer: Self-pay | Admitting: Internal Medicine

## 2015-08-30 ENCOUNTER — Ambulatory Visit (INDEPENDENT_AMBULATORY_CARE_PROVIDER_SITE_OTHER): Payer: Self-pay | Admitting: Internal Medicine

## 2015-08-30 ENCOUNTER — Ambulatory Visit: Payer: Self-pay | Admitting: Pharmacist

## 2015-08-30 VITALS — BP 156/85 | HR 87 | Temp 97.6°F | Ht 71.0 in | Wt 348.3 lb

## 2015-08-30 DIAGNOSIS — I110 Hypertensive heart disease with heart failure: Secondary | ICD-10-CM

## 2015-08-30 DIAGNOSIS — I10 Essential (primary) hypertension: Secondary | ICD-10-CM

## 2015-08-30 DIAGNOSIS — I483 Typical atrial flutter: Secondary | ICD-10-CM

## 2015-08-30 DIAGNOSIS — I5032 Chronic diastolic (congestive) heart failure: Secondary | ICD-10-CM

## 2015-08-30 DIAGNOSIS — B182 Chronic viral hepatitis C: Secondary | ICD-10-CM

## 2015-08-30 HISTORY — DX: Chronic diastolic (congestive) heart failure: I50.32

## 2015-08-30 NOTE — Assessment & Plan Note (Signed)
Filed Vitals:   08/30/15 1100 08/30/15 1102  BP:  156/85  Pulse:  87  Temp: 97.6 F (36.4 C)    On repeat BP was 140/78. At home it was 136/89.  - will do lifestyle modification for now. Already on lasix for CHF.  - cont to monitor, asked him to keep a log of BP at home.

## 2015-08-30 NOTE — Telephone Encounter (Signed)
Visit today, no TOC

## 2015-08-30 NOTE — Assessment & Plan Note (Signed)
dCHF likely 2/2 to Aflutter. He has edema on both legs but not having any symptoms of CHF. His weight was 331 lb on discharge but 348 today. Not sure if there is discrepency between our scale and hospital scale. He is drinking lots of water without any restriction. Currently on lasix 40mg  daily.  - asked him to limit water intake to <1500 cc daily and also to avoid salt - will continue lasix 40mg  daily for now as he is not having any CHF symptoms and the weight change may be inaccurate - asked him to buy a scale for home and do daily weights.

## 2015-08-30 NOTE — Assessment & Plan Note (Addendum)
Remains in Aflutter, rate is well controlled on diltiazem 240mg  24hr tablet. -cont this for now. Has appt with cardiology on 6/23 for follow up, and possible cardioversion.

## 2015-08-30 NOTE — Patient Instructions (Signed)
Please keep your water intake below <1500 mL per day. Avoid salt.  Weigh yourself daily.   Continue same regimen.  Don't miss your appt with cardiology  F/up with [place] in 1 month.

## 2015-08-30 NOTE — Progress Notes (Signed)
   Subjective:    Patient ID: Frank Morales, male    DOB: [birth year], 55 y.o.   MRN: [id number]  HPI  55 yo male with HTN, cocaine abuse, recently diagnosed Aflutter and CHF, here for hospital follow up for Aflutter and CHF.  Diastolic CHF - likely 2/2 to aflutter. last discharge weight was 331 lb, today he is 348.3 lb. No SOB with flat surface but gets out of breathe going up hill. No chest pain. Sleeping flat at night. Has been drinking lot of water.   Aflutter - was put on diltiazem and xarelto. Plan is to follow up in cardiology 3-4 weeks from discharge for cardioversion.  Has appt on 6/23 - discussed with patient.   HTN - BP 156/85 but on recehck its 140/78. At home it was 136/89 yesterday at home.  Hep c - was recently diagnosed in the hospital. interested in treatment. Will start blood work and referral process.   Review of Systems  Constitutional: Negative for fever and chills.  HENT: Negative for congestion and sinus pressure.   Eyes: Negative for photophobia and visual disturbance.  Respiratory: Negative for cough, shortness of breath and wheezing.   Cardiovascular: Positive for leg swelling. Negative for chest pain and palpitations.  Gastrointestinal: Negative for abdominal pain and abdominal distention.  Genitourinary: Negative for dysuria and flank pain.  Musculoskeletal: Negative for back pain and arthralgias.  Neurological: Negative for dizziness and numbness.       Objective:   Physical Exam  Constitutional: He is oriented to person, place, and time. He appears well-developed and well-nourished.  Obese male  HENT:  Head: Normocephalic and atraumatic.  Eyes: Conjunctivae are normal. Pupils are equal, round, and reactive to light. Right eye exhibits no discharge. Left eye exhibits no discharge. No scleral icterus.  Neck: Normal range of motion.  Cardiovascular: Normal rate.  Exam reveals no gallop and no friction rub.   No murmur heard. Irregular rhythm.    Pulmonary/Chest: Effort normal and breath sounds normal.  Abdominal: Soft. Bowel sounds are normal. He exhibits no distension. There is no tenderness.  Musculoskeletal: Normal range of motion. He exhibits no tenderness.  1+ pitting edema bilaterally upto the shins  Neurological: He is alert and oriented to person, place, and time. No cranial nerve deficit. Coordination normal.  Psychiatric: He has a normal mood and affect.     Filed Vitals:   08/30/15 1100 08/30/15 1102  BP:  156/85  Pulse:  87  Temp: 97.6 F (36.4 C)         Assessment & Plan:  See problem based a&p.

## 2015-08-30 NOTE — Assessment & Plan Note (Signed)
Hep C Ab +, viral log 5.7 during admission. Had all of the blood work done already in the hospital including CMET, CBC, INR, Hep A and Hep B. Since he does not have insurance, will not order fibrosure. -will order electrography - referral to ID

## 2015-08-31 NOTE — Progress Notes (Signed)
Internal Medicine Clinic Attending  Case discussed with Dr. Ahmed at the time of the visit.  We reviewed the resident's history and exam and pertinent patient test results.  I agree with the assessment, diagnosis, and plan of care documented in the resident's note. 

## 2015-09-06 ENCOUNTER — Ambulatory Visit: Payer: Self-pay | Admitting: Pharmacist

## 2015-09-06 DIAGNOSIS — I10 Essential (primary) hypertension: Secondary | ICD-10-CM

## 2015-09-06 NOTE — Progress Notes (Unsigned)
Patient ID: Frank Morales, male   DOB: June 07, 1960, 55 y.o.   MRN: [id number] Patient submitted paperwork to complete [place] [facility] application. Application faxed.

## 2015-09-08 ENCOUNTER — Ambulatory Visit (INDEPENDENT_AMBULATORY_CARE_PROVIDER_SITE_OTHER): Payer: No Typology Code available for payment source | Admitting: Internal Medicine

## 2015-09-08 ENCOUNTER — Encounter: Payer: Self-pay | Admitting: Internal Medicine

## 2015-09-08 VITALS — BP 158/102 | HR 102 | Ht 71.0 in | Wt 341.4 lb

## 2015-09-08 DIAGNOSIS — I4891 Unspecified atrial fibrillation: Secondary | ICD-10-CM

## 2015-09-08 LAB — BASIC METABOLIC PANEL
BUN: 14 mg/dL (ref 7–25)
CHLORIDE: 100 mmol/L (ref 98–110)
CO2: 27 mmol/L (ref 20–31)
CREATININE: 0.85 mg/dL (ref 0.70–1.33)
Calcium: 8.7 mg/dL (ref 8.6–10.3)
Glucose, Bld: 86 mg/dL (ref 65–99)
POTASSIUM: 4.3 mmol/L (ref 3.5–5.3)
Sodium: 138 mmol/L (ref 135–146)

## 2015-09-08 LAB — CBC WITH DIFFERENTIAL/PLATELET
BASOS ABS: 0 {cells}/uL (ref 0–200)
BASOS PCT: 0 %
Eosinophils Absolute: 108 cells/uL (ref 15–500)
Eosinophils Relative: 2 %
HCT: 42.8 % (ref 38.5–50.0)
Hemoglobin: 15 g/dL (ref 13.2–17.1)
LYMPHS PCT: 42 %
Lymphs Abs: 2268 cells/uL (ref 850–3900)
MCH: 31.6 pg (ref 27.0–33.0)
MCHC: 35 g/dL (ref 32.0–36.0)
MCV: 90.3 fL (ref 80.0–100.0)
MONOS PCT: 12 %
MPV: 11 fL (ref 7.5–12.5)
Monocytes Absolute: 648 cells/uL (ref 200–950)
NEUTROS ABS: 2376 {cells}/uL (ref 1500–7800)
NEUTROS PCT: 44 %
PLATELETS: 115 10*3/uL — AB (ref 140–400)
RBC: 4.74 MIL/uL (ref 4.20–5.80)
RDW: 14.5 % (ref 11.0–15.0)
WBC: 5.4 10*3/uL (ref 3.8–10.8)

## 2015-09-08 NOTE — Progress Notes (Signed)
Cardiology Office Note   Date:  09/08/2015   ID:  Frank Morales, DOB May 14, 1960, MRN [id number]  PCP:  Dellia Nims, MD  Cardiologist:   Dorris Carnes, MD   F/U of diastolic CHF    History of Present Illness: Frank Morales is a 55 y.o. male with a history of dyspnea and CHF  He was admitted to [facility] in early June  Found to be in atrial flutter  Echo showed LVEF was o50 to 55%  R and L heart catheterization were done  Minimal CAD   Mild pulmonary HTN  Mild increased LVEDP The pt was treated medically  Plan for anticoagulation for 4 wks and cardioversion  SInce seen the pt still with edema  SOB with activity  But better than when admitted to hospital  Denies palpitations.         Outpatient Prescriptions Prior to Visit  Medication Sig Dispense Refill  . diltiazem (CARDIZEM CD) 240 MG 24 hr capsule Take 1 capsule (240 mg total) by mouth daily. 30 capsule 0  . furosemide (LASIX) 40 MG tablet Take 1 tablet (40 mg total) by mouth daily. 30 tablet 0  . rivaroxaban (XARELTO) 20 MG TABS tablet Take 1 tablet (20 mg total) by mouth daily with supper. 30 tablet 0   No facility-administered medications prior to visit.     Allergies:   Review of patient's allergies indicates no known allergies.   Past Medical History  Diagnosis Date  . Smoker   . Abscess and cellulitis     Past Surgical History  Procedure Laterality Date  . No past surgeries    . Cardiac catheterization N/A 08/21/2015    Procedure: Right/Left Heart Cath and Coronary Angiography;  Surgeon: Leonie Man, MD;  Location: [place] [facility];  Service: Cardiovascular;  Laterality: N/A;     Social History:  The patient  reports that he has been smoking Cigarettes.  He has been smoking about 0.30 packs per day. He does not have any smokeless tobacco history on file. He reports that he drinks about 1.8 oz of alcohol per week. He reports that he does not use illicit drugs.   Family History:  The  patient's family history includes Diabetes in his mother; Hypertension in his maternal grandfather and mother.    ROS:  Please see the history of present illness. All other systems are reviewed and  Negative to the above problem except as noted.    PHYSICAL EXAM: VS:  BP 158/102 mmHg  Pulse 102  Ht 5\' 11"  (1.803 m)  Wt 341 lb 6.4 oz (154.858 kg)  BMI 47.64 kg/m2  GEN: Well nourished, well developed, in no acute distress HEENT: normal Neck: no JVD, carotid bruits, or masses Cardiac: RRR; no murmurs, rubs, or gallops, 1+ edema  Respiratory:  clear to auscultation bilaterally, normal work of breathing GI: soft, nontender, nondistended, + BS  No hepatomegaly  MS: no deformity Moving all extremities   Skin: warm and dry, no rash Neuro:  Strength and sensation are intact Psych: euthymic mood, full affect   EKG:  EKG is ordered today. Atrial fib/flutter  HR 100 bpm     Lipid Panel    Component Value Date/Time   CHOL 105 08/17/2015 1233   TRIG 182* 08/17/2015 1233   HDL 28* 08/17/2015 1233   CHOLHDL 3.8 08/17/2015 1233   VLDL 36 08/17/2015 1233   LDLCALC 41 08/17/2015 1233      Wt Readings from Last  3 Encounters:  09/08/15 341 lb 6.4 oz (154.858 kg)  08/30/15 348 lb 4.8 oz (157.988 kg)  08/22/15 331 lb (150.141 kg)      ASSESSMENT AND PLAN:  1  Atrail flutter  Continue current regimn  EKG more c/w atrial fib Has not missed Xarelto dose  Plan for cardioversion 7/6  2  Diastolic CHF  Still with volume incrase on exam  Will check CBC, BMET and BNP and contact pt re lasix dosing   PLan for f/u after later this summer      Signed, Dorris Carnes, MD  09/08/2015 2:02 PM    [address], [place], [place]  [address] Phone: [phone]; Fax: [phone]

## 2015-09-08 NOTE — Patient Instructions (Addendum)
Medication Instructions:  Your physician recommends that you continue on your current medications as directed. Please refer to the Current Medication list given to you today.  Labwork: Cbc/bmet  Testing/Procedures: You are scheduled for a cardioversion on 09/22/15 with Dr. Johnsie Cancel or associate.  Go to [facility] [place] on 09/22/15 at 10:30 am.  [facility] thru [facility] A No food or drink after midnight on night before You may take your medications with a sip of water on the day of your procedure.  Hold your Furosemide morning of  Follow-Up: Your physician recommends that you schedule a follow-up appointment in: 2-4 weeks after cardioversion   Electrical Cardioversion Electrical cardioversion is the delivery of a jolt of electricity to change the rhythm of the heart. Sticky patches or metal paddles are placed on the chest to deliver the electricity from a device. This is done to restore a normal rhythm. A rhythm that is too fast or not regular keeps the heart from pumping well. Electrical cardioversion is done in an emergency if:   There is low or no blood pressure as a result of the heart rhythm.   Normal rhythm must be restored as fast as possible to protect the brain and heart from further damage.   It may save a life. Cardioversion may be done for heart rhythms that are not immediately life threatening, such as atrial fibrillation or flutter, in which:   The heart is beating too fast or is not regular.   Medicine to change the rhythm has not worked.   It is safe to wait in order to allow time for preparation.  Symptoms of the abnormal rhythm are bothersome.  The risk of stroke and other serious problems can be reduced. LET [facility] CARE PROVIDER KNOW ABOUT:   Any allergies you have.  All medicines you are taking, including vitamins, herbs, eye drops, creams, and over-the-counter medicines.  Previous problems you or members of your family  have had with the use of anesthetics.   Any blood disorders you have.   Previous surgeries you have had.   Medical conditions you have. RISKS AND COMPLICATIONS  Generally, this is a safe procedure. However, problems can occur and include:   Breathing problems related to the anesthetic used.  A blood clot that breaks free and travels to other parts of your body. This could cause a stroke or other problems. The risk of this is lowered by use of blood-thinning medicine (anticoagulant) prior to the procedure.  Cardiac arrest (rare). BEFORE THE PROCEDURE   You may have tests to detect blood clots in your heart and to evaluate heart function.  You may start taking anticoagulants so your blood does not clot as easily.   Medicines may be given to help stabilize your heart rate and rhythm. PROCEDURE  You will be given medicine through an IV tube to reduce discomfort and make you sleepy (sedative).   An electrical shock will be delivered. AFTER THE PROCEDURE Your heart rhythm will be watched to make sure it does not change.    This information is not intended to replace advice given to you by your health care provider. Make sure you discuss any questions you have with your health care provider.   Document Released: 02/22/2002 Document Revised: 03/25/2014 Document Reviewed: 09/16/2012 Elsevier Interactive Patient Education [employer].

## 2015-09-14 ENCOUNTER — Other Ambulatory Visit: Payer: Self-pay | Admitting: *Deleted

## 2015-09-14 DIAGNOSIS — Z79899 Other long term (current) drug therapy: Secondary | ICD-10-CM

## 2015-09-20 MED ORDER — FUROSEMIDE 40 MG PO TABS: 40.0000 mg | ORAL_TABLET | Freq: Every day | ORAL | Status: DC

## 2015-09-20 MED FILL — FUROSEMIDE 40 MG TABLET: 40 | 30 days supply | Qty: 30 | Fill #0

## 2015-09-20 NOTE — Addendum Note (Signed)
Addended by: Forde Dandy on: 09/20/2015 03:14 PM   Modules accepted: Orders, Medications

## 2015-09-22 ENCOUNTER — Encounter (HOSPITAL_COMMUNITY): Payer: Self-pay | Admitting: *Deleted

## 2015-09-22 ENCOUNTER — Other Ambulatory Visit (INDEPENDENT_AMBULATORY_CARE_PROVIDER_SITE_OTHER): Payer: No Typology Code available for payment source | Admitting: *Deleted

## 2015-09-22 ENCOUNTER — Ambulatory Visit (HOSPITAL_COMMUNITY): Payer: Self-pay | Admitting: Certified Registered"

## 2015-09-22 ENCOUNTER — Ambulatory Visit (HOSPITAL_COMMUNITY)
Admission: RE | Admit: 2015-09-22 | Discharge: 2015-09-22 | Disposition: A | Discharge status: Home / Self Care | Payer: No Typology Code available for payment source | Source: Ambulatory Visit | Attending: Cardiovascular Disease | Admitting: Cardiovascular Disease

## 2015-09-22 ENCOUNTER — Encounter (HOSPITAL_COMMUNITY)
Admission: RE | Disposition: A | Discharge status: Home / Self Care | Payer: Self-pay | Source: Ambulatory Visit | Attending: Cardiovascular Disease

## 2015-09-22 DIAGNOSIS — F17210 Nicotine dependence, cigarettes, uncomplicated: Secondary | ICD-10-CM | POA: Insufficient documentation

## 2015-09-22 DIAGNOSIS — Z6841 Body Mass Index (BMI) 40.0 and over, adult: Secondary | ICD-10-CM | POA: Insufficient documentation

## 2015-09-22 DIAGNOSIS — Z79899 Other long term (current) drug therapy: Secondary | ICD-10-CM

## 2015-09-22 DIAGNOSIS — I5032 Chronic diastolic (congestive) heart failure: Secondary | ICD-10-CM | POA: Insufficient documentation

## 2015-09-22 DIAGNOSIS — Z7901 Long term (current) use of anticoagulants: Secondary | ICD-10-CM | POA: Insufficient documentation

## 2015-09-22 DIAGNOSIS — I4891 Unspecified atrial fibrillation: Secondary | ICD-10-CM

## 2015-09-22 DIAGNOSIS — I110 Hypertensive heart disease with heart failure: Secondary | ICD-10-CM | POA: Insufficient documentation

## 2015-09-22 DIAGNOSIS — I4892 Unspecified atrial flutter: Secondary | ICD-10-CM | POA: Insufficient documentation

## 2015-09-22 HISTORY — PX: CARDIOVERSION: SHX1299

## 2015-09-22 LAB — BASIC METABOLIC PANEL
BUN: 11 mg/dL (ref 7–25)
CO2: 28 mmol/L (ref 20–31)
CREATININE: 0.72 mg/dL (ref 0.70–1.33)
Calcium: 8.5 mg/dL — ABNORMAL LOW (ref 8.6–10.3)
Chloride: 105 mmol/L (ref 98–110)
GLUCOSE: 104 mg/dL — AB (ref 65–99)
Potassium: 3.9 mmol/L (ref 3.5–5.3)
Sodium: 138 mmol/L (ref 135–146)

## 2015-09-22 LAB — POCT I-STAT, CHEM 8
BUN: 11 mg/dL (ref 6–20)
CALCIUM ION: 1.18 mmol/L (ref 1.13–1.30)
CREATININE: 0.7 mg/dL (ref 0.61–1.24)
Chloride: 104 mmol/L (ref 101–111)
GLUCOSE: 98 mg/dL (ref 65–99)
HCT: 44 % (ref 39.0–52.0)
HEMOGLOBIN: 15 g/dL (ref 13.0–17.0)
POTASSIUM: 3.9 mmol/L (ref 3.5–5.1)
Sodium: 141 mmol/L (ref 135–145)
TCO2: 28 mmol/L (ref 0–100)

## 2015-09-22 SURGERY — CARDIOVERSION
Anesthesia: Monitor Anesthesia Care

## 2015-09-22 MED ORDER — PROPOFOL 10 MG/ML IV BOLUS
INTRAVENOUS | Status: DC | PRN
  Administered 2015-09-22: 30 mg via INTRAVENOUS
  Administered 2015-09-22: 70 mg via INTRAVENOUS
  Administered 2015-09-22: 40 mg via INTRAVENOUS

## 2015-09-22 MED ORDER — DILTIAZEM HCL ER COATED BEADS 240 MG PO CP24: 240.0000 mg | ORAL_CAPSULE | Freq: Every day | ORAL | Status: DC

## 2015-09-22 MED ORDER — LIDOCAINE 2% (20 MG/ML) 5 ML SYRINGE
INTRAMUSCULAR | Status: DC | PRN
  Administered 2015-09-22: 60 mg via INTRAVENOUS

## 2015-09-22 MED ORDER — SODIUM CHLORIDE 0.9 % IV SOLN
INTRAVENOUS | Status: DC
  Administered 2015-09-22: 500 mL via INTRAVENOUS

## 2015-09-22 MED FILL — CARTIA XT 240 MG CAPSULE: 240 | 30 days supply | Qty: 30 | Fill #0

## 2015-09-22 NOTE — Discharge Instructions (Signed)
Electrical Cardioversion, Care After °Refer to this sheet in the next few weeks. These instructions provide you with information on caring for yourself after your procedure. Your health care provider may also give you more specific instructions. Your treatment has been planned according to current medical practices, but problems sometimes occur. Call your health care provider if you have any problems or questions after your procedure. °WHAT TO EXPECT AFTER THE PROCEDURE °After your procedure, it is typical to have the following sensations: °· Some redness on the skin where the shocks were delivered. If this is tender, a sunburn lotion or hydrocortisone cream may help. °· Possible return of an abnormal heart rhythm within hours or days after the procedure. °HOME CARE INSTRUCTIONS °· Take medicines only as directed by your health care provider. Be sure you understand how and when to take your medicine. °· Learn how to feel your pulse and check it often. °· Limit your activity for 48 hours after the procedure or as directed by your health care provider. °· Avoid or minimize caffeine and other stimulants as directed by your health care provider. °SEEK MEDICAL CARE IF: °· You feel like your heart is beating too fast or your pulse is not regular. °· You have any questions about your medicines. °· You have bleeding that will not stop. °SEEK IMMEDIATE MEDICAL CARE IF: °· You are dizzy or feel faint. °· It is hard to breathe or you feel short of breath. °· There is a change in discomfort in your chest. °· Your speech is slurred or you have trouble moving an arm or leg on one side of your body. °· You get a serious muscle cramp that does not go away. °· Your fingers or toes turn cold or blue. °  °This information is not intended to replace advice given to you by your health care provider. Make sure you discuss any questions you have with your health care provider. °  °Document Released: 12/23/2012 Document Revised: 03/25/2014  Document Reviewed: 12/23/2012 °Elsevier Interactive Patient Education ©2016 [employer] ° °

## 2015-09-22 NOTE — Interval H&P Note (Signed)
History and Physical Interval Note:  09/22/2015 10:08 AM  Frank Morales  has presented today for surgery, with the diagnosis of afib  The various methods of treatment have been discussed with the patient and family. After consideration of risks, benefits and other options for treatment, the patient has consented to  Procedure(s): CARDIOVERSION (N/A) as a surgical intervention .  The patient's history has been reviewed, patient examined, no change in status, stable for surgery.  I have reviewed the patient's chart and labs.  Questions were answered to the patient's satisfaction.     Jenkins Rouge

## 2015-09-22 NOTE — Anesthesia Postprocedure Evaluation (Signed)
Anesthesia Post Note  Patient: Frank Morales  Procedure(s) Performed: Procedure(s) (LRB): CARDIOVERSION (N/A)  Patient location during evaluation: PACU Anesthesia Type: General Level of consciousness: awake and alert Pain management: pain level controlled Vital Signs Assessment: post-procedure vital signs reviewed and stable Respiratory status: spontaneous breathing, nonlabored ventilation, respiratory function stable and patient connected to nasal cannula oxygen Cardiovascular status: blood pressure returned to baseline and stable Postop Assessment: no signs of nausea or vomiting Anesthetic complications: no    Last Vitals:  Filed Vitals:   09/22/15 1230 09/22/15 1240  BP: 124/65 132/71  Pulse: 54 84  Temp:    Resp: 14 19    Last Pain: There were no vitals filed for this visit.               Ellese Julius S

## 2015-09-22 NOTE — H&P (View-Only) (Signed)
Cardiology Office Note   Date:  09/08/2015   ID:  Frank Morales, DOB 07-20-1960, MRN [id number]  PCP:  Dellia Nims, MD  Cardiologist:   Dorris Carnes, MD   F/U of diastolic CHF    History of Present Illness: Frank Morales is a 55 y.o. male with a history of dyspnea and CHF  He was admitted to [facility] in early June  Found to be in atrial flutter  Echo showed LVEF was o50 to 55%  R and L heart catheterization were done  Minimal CAD   Mild pulmonary HTN  Mild increased LVEDP The pt was treated medically  Plan for anticoagulation for 4 wks and cardioversion  SInce seen the pt still with edema  SOB with activity  But better than when admitted to hospital  Denies palpitations.         Outpatient Prescriptions Prior to Visit  Medication Sig Dispense Refill  . diltiazem (CARDIZEM CD) 240 MG 24 hr capsule Take 1 capsule (240 mg total) by mouth daily. 30 capsule 0  . furosemide (LASIX) 40 MG tablet Take 1 tablet (40 mg total) by mouth daily. 30 tablet 0  . rivaroxaban (XARELTO) 20 MG TABS tablet Take 1 tablet (20 mg total) by mouth daily with supper. 30 tablet 0   No facility-administered medications prior to visit.     Allergies:   Review of patient's allergies indicates no known allergies.   Past Medical History  Diagnosis Date  . Smoker   . Abscess and cellulitis     Past Surgical History  Procedure Laterality Date  . No past surgeries    . Cardiac catheterization N/A 08/21/2015    Procedure: Right/Left Heart Cath and Coronary Angiography;  Surgeon: Leonie Man, MD;  Location: [place] [facility];  Service: Cardiovascular;  Laterality: N/A;     Social History:  The patient  reports that he has been smoking Cigarettes.  He has been smoking about 0.30 packs per day. He does not have any smokeless tobacco history on file. He reports that he drinks about 1.8 oz of alcohol per week. He reports that he does not use illicit drugs.   Family History:  The  patient's family history includes Diabetes in his mother; Hypertension in his maternal grandfather and mother.    ROS:  Please see the history of present illness. All other systems are reviewed and  Negative to the above problem except as noted.    PHYSICAL EXAM: VS:  BP 158/102 mmHg  Pulse 102  Ht 5\' 11"  (1.803 m)  Wt 341 lb 6.4 oz (154.858 kg)  BMI 47.64 kg/m2  GEN: Well nourished, well developed, in no acute distress HEENT: normal Neck: no JVD, carotid bruits, or masses Cardiac: RRR; no murmurs, rubs, or gallops, 1+ edema  Respiratory:  clear to auscultation bilaterally, normal work of breathing GI: soft, nontender, nondistended, + BS  No hepatomegaly  MS: no deformity Moving all extremities   Skin: warm and dry, no rash Neuro:  Strength and sensation are intact Psych: euthymic mood, full affect   EKG:  EKG is ordered today. Atrial fib/flutter  HR 100 bpm     Lipid Panel    Component Value Date/Time   CHOL 105 08/17/2015 1233   TRIG 182* 08/17/2015 1233   HDL 28* 08/17/2015 1233   CHOLHDL 3.8 08/17/2015 1233   VLDL 36 08/17/2015 1233   LDLCALC 41 08/17/2015 1233      Wt Readings from Last  3 Encounters:  09/08/15 341 lb 6.4 oz (154.858 kg)  08/30/15 348 lb 4.8 oz (157.988 kg)  08/22/15 331 lb (150.141 kg)      ASSESSMENT AND PLAN:  1  Atrail flutter  Continue current regimn  EKG more c/w atrial fib Has not missed Xarelto dose  Plan for cardioversion 7/6  2  Diastolic CHF  Still with volume incrase on exam  Will check CBC, BMET and BNP and contact pt re lasix dosing   PLan for f/u after later this summer      Signed, Dorris Carnes, MD  09/08/2015 2:02 PM    [address], [place], [place]  [address] Phone: [phone]; Fax: [phone]

## 2015-09-22 NOTE — Transfer of Care (Signed)
Immediate Anesthesia Transfer of Care Note  Patient: Frank Morales  Procedure(s) Performed: Procedure(s): CARDIOVERSION (N/A)  Patient Location: PACU  Anesthesia Type:General  Level of Consciousness: awake, oriented and patient cooperative  Airway & Oxygen Therapy: Patient Spontanous Breathing and Patient connected to nasal cannula oxygen  Post-op Assessment: Report given to RN, Post -op Vital signs reviewed and stable and Patient moving all extremities  Post vital signs: Reviewed and stable  Last Vitals:  Filed Vitals:   09/22/15 1111  BP: 142/98  Temp: 36.5 C  Resp: 20    Last Pain: There were no vitals filed for this visit.       Complications: No apparent anesthesia complications

## 2015-09-22 NOTE — Anesthesia Preprocedure Evaluation (Addendum)
Anesthesia Evaluation  Patient identified by MRN, date of birth, ID band Patient awake    Reviewed: Allergy & Precautions, NPO status , Patient's Chart, lab work & pertinent test results  Airway Mallampati: II  TM Distance: >3 FB Neck ROM: Full    Dental no notable dental hx.    Pulmonary Current Smoker,    Pulmonary exam normal breath sounds clear to auscultation       Cardiovascular hypertension, + dysrhythmias Atrial Fibrillation  Rhythm:Irregular Rate:Normal  Left ventricle: Difficult acoustical windows make assessment of  wall motion difficult even with definity. Study is suggestive of  apical hypokinesis. The cavity size was normal. There was  moderate focal basal and mild concentric hypertrophy. Systolic  function was normal. The estimated ejection fraction was in the  range of 50% to 55%. Diffuse hypokinesis. The study was not  technically sufficient to allow evaluation of LV diastolic  dysfunction due to atrial fibrillation. - Aortic valve: Trileaflet; mildly thickened, mildly calcified  leaflets. - Mitral valve: There was trivial regurgitation. - Left atrium: The atrium was severely dilated. - Right atrium: The atrium was severely dilated. - Tricuspid valve: There was mild regurgitation. - Pulmonary arteries: PA peak pressure: 37 mm Hg (S).  Impressions:  - The right ventricular systolic pressure was increased consistent  with mild pulmonary hypertension.   Neuro/Psych negative neurological ROS  negative psych ROS   GI/Hepatic negative GI ROS, (+)     substance abuse  cocaine use, Hepatitis -, C  Endo/Other  Morbid obesity  Renal/GU negative Renal ROS  negative genitourinary   Musculoskeletal negative musculoskeletal ROS (+)   Abdominal   Peds negative pediatric ROS (+)  Hematology negative hematology ROS (+)   Anesthesia Other Findings   Reproductive/Obstetrics negative OB ROS                            Anesthesia Physical Anesthesia Plan  ASA: III  Anesthesia Plan: MAC   Post-op Pain Management:    Induction: Intravenous  Airway Management Planned: Mask  Additional Equipment:   Intra-op Plan:   Post-operative Plan:   Informed Consent: I have reviewed the patients History and Physical, chart, labs and discussed the procedure including the risks, benefits and alternatives for the proposed anesthesia with the patient or authorized representative who has indicated his/her understanding and acceptance.   Dental advisory given  Plan Discussed with: CRNA and Surgeon  Anesthesia Plan Comments:         Anesthesia Quick Evaluation

## 2015-09-22 NOTE — CV Procedure (Signed)
Anesthesia:  Dr Kalman Shan 140 propofol 60 mg Lidocaine DCC x 3  150 J, 200 J x2  Biphasic Afib rate 75-100 No conversion to sinus at all Hemodynamics stable No neurologic sequelae  Failed [facility] f/u Dr Harrington Challenger Afib clinic to consider antiarrythmic Rx  Jenkins Rouge

## 2015-09-22 NOTE — Addendum Note (Signed)
Addended by: Eulis Foster on: 09/22/2015 09:02 AM   Modules accepted: Orders

## 2015-09-25 MED ORDER — FUROSEMIDE 40 MG PO TABS: 40.0000 mg | ORAL_TABLET | Freq: Every day | ORAL | Status: DC

## 2015-09-25 MED ORDER — RIVAROXABAN 20 MG PO TABS
20.0000 mg | ORAL_TABLET | Freq: Every day | ORAL | Status: DC
Start: 2015-09-25 — End: 2015-10-02

## 2015-09-25 NOTE — Addendum Note (Signed)
Addended by: Forde Dandy on: 09/25/2015 01:45 PM   Modules accepted: Orders

## 2015-09-28 ENCOUNTER — Other Ambulatory Visit: Payer: Self-pay

## 2015-09-28 ENCOUNTER — Ambulatory Visit (HOSPITAL_COMMUNITY)
Admission: RE | Admit: 2015-09-28 | Discharge: 2015-09-28 | Disposition: A | Discharge status: Home / Self Care | Payer: No Typology Code available for payment source | Source: Ambulatory Visit | Attending: Nurse Practitioner | Admitting: Nurse Practitioner

## 2015-09-28 ENCOUNTER — Encounter (HOSPITAL_COMMUNITY): Payer: Self-pay | Admitting: Nurse Practitioner

## 2015-09-28 ENCOUNTER — Ambulatory Visit (HOSPITAL_COMMUNITY): Payer: No Typology Code available for payment source | Admitting: Nurse Practitioner

## 2015-09-28 VITALS — BP 144/72 | HR 90 | Ht 71.0 in | Wt 347.6 lb

## 2015-09-28 DIAGNOSIS — I4892 Unspecified atrial flutter: Secondary | ICD-10-CM | POA: Insufficient documentation

## 2015-09-28 DIAGNOSIS — I4891 Unspecified atrial fibrillation: Secondary | ICD-10-CM

## 2015-09-28 DIAGNOSIS — Z79899 Other long term (current) drug therapy: Secondary | ICD-10-CM | POA: Insufficient documentation

## 2015-09-28 DIAGNOSIS — B1920 Unspecified viral hepatitis C without hepatic coma: Secondary | ICD-10-CM | POA: Insufficient documentation

## 2015-09-28 DIAGNOSIS — Z7901 Long term (current) use of anticoagulants: Secondary | ICD-10-CM | POA: Insufficient documentation

## 2015-09-28 DIAGNOSIS — F1421 Cocaine dependence, in remission: Secondary | ICD-10-CM | POA: Insufficient documentation

## 2015-09-28 DIAGNOSIS — F17210 Nicotine dependence, cigarettes, uncomplicated: Secondary | ICD-10-CM | POA: Insufficient documentation

## 2015-09-28 DIAGNOSIS — I272 Other secondary pulmonary hypertension: Secondary | ICD-10-CM | POA: Insufficient documentation

## 2015-09-28 MED ORDER — METOPROLOL SUCCINATE ER 25 MG PO TB24: 25.0000 mg | ORAL_TABLET | Freq: Every day | ORAL | Status: DC

## 2015-09-28 NOTE — Patient Instructions (Signed)
Your physician has recommended you make the following change in your medication: START Metoprolol Succinate 25 mg once daily.  This has been sent to your pharmacy.  Your physician has recommended that you have a sleep study. This test records several body functions during sleep, including: brain activity, eye movement, oxygen and carbon dioxide blood levels, heart rate and rhythm, breathing rate and rhythm, the flow of air through your mouth and nose, snoring, body muscle movements, and chest and belly movement.  A representative will call you to schedule this.  Your physician recommends that you schedule a follow-up appointment in: 6 weeks with Roderic Palau, NP

## 2015-09-28 NOTE — Progress Notes (Signed)
Patient ID: Frank Morales, male   DOB: 14-Aug-1960, 55 y.o.   MRN: [id number]     Primary Care Physician: Dellia Nims, MD Referring Physician: Dr. Kathrynn Ducking is a 55 y.o. male with a h/o hospitalization 6/7, NICM, new dx HCV antibody positive AFIB/flutter, CHF, Tobacco abuse, HTN, presumed OSA, Morbid obesity, cocaine abuse thought to have contributed to cardiomyopathy, new dx of HCV  and is afib clinic for f/u. He is on xarelto for chadsvasc score of at least 2(HTN, Diastolic dysfunction). He gives a history of not feeling well x 6-9 months with fatigue, shortness of breath, PND, orthopnea, swelling. States no further cocaine use x one month, continues to smoke. Feels better than prior to admission but still short of breath with minimal activity.  He was seen in f/u with Dr. Harrington Challenger and set up for cardioversion, pt did not convert.He continues on xarelto and understands stroke risk. Rate controlled. Echo showed left atrium of 49 mm. He is pending evaluation for new HepC diagnosis.  Today, he denies symptoms of palpitations, chest pain, shortness of breath, orthopnea, PND, lower extremity edema, dizziness, presyncope, syncope, or neurologic sequela. The patient is tolerating medications without difficulties and is otherwise without complaint today.   Past Medical History  Diagnosis Date  . Smoker   . Abscess and cellulitis    Past Surgical History  Procedure Laterality Date  . No past surgeries    . Cardiac catheterization N/A 08/21/2015    Procedure: Right/Left Heart Cath and Coronary Angiography;  Surgeon: Leonie Man, MD;  Location: [place] [facility];  Service: Cardiovascular;  Laterality: N/A;  . Cardioversion N/A 09/22/2015    Procedure: CARDIOVERSION;  Surgeon: Josue Hector, MD;  Location: [facility];  Service: Cardiovascular;  Laterality: N/A;    Current Outpatient Prescriptions  Medication Sig Dispense Refill  . diltiazem (CARDIZEM CD) 240 MG 24 hr capsule  Take 1 capsule (240 mg total) by mouth daily. 340B Chris 30 capsule 0  . furosemide (LASIX) 40 MG tablet Take 1 tablet (40 mg total) by mouth daily. 340B Chris 90 tablet 0  . rivaroxaban (XARELTO) 20 MG TABS tablet Take 1 tablet (20 mg total) by mouth daily with supper. 90 tablet 0  . metoprolol succinate (TOPROL XL) 25 MG 24 hr tablet Take 1 tablet (25 mg total) by mouth daily. 30 tablet 6   No current facility-administered medications for this encounter.    No Known Allergies  Social History   Social History  . Marital Status: Single    Spouse Name: N/A  . Number of Children: N/A  . Years of Education: N/A   Occupational History  . unemployed     Previous Training and development officer    Social History Main Topics  . Smoking status: Current Every Day Smoker -- 0.30 packs/day    Types: Cigarettes  . Smokeless tobacco: Not on file     Comment: 3 -4 cigs/day.  . Alcohol Use: 1.8 oz/week    3 Standard drinks or equivalent per week     Comment: occ beer.  . Drug Use: No  . Sexual Activity: Not on file   Other Topics Concern  . Not on file   Social History Narrative    Family History  Problem Relation Age of Onset  . Hypertension Mother   . Hypertension Maternal Grandfather   . Diabetes Mother   . Pneumonia Father     ROS- All systems are reviewed and negative except as  per the HPI above  Physical Exam: Filed Vitals:   09/28/15 0953  BP: 144/72  Pulse: 90  Height: 5\' 11"  (1.803 m)  Weight: 347 lb 9.6 oz (157.67 kg)    GEN- The patient is well appearing, alert and oriented x 3 today.   Head- normocephalic, atraumatic Eyes-  Sclera clear, conjunctiva pink Ears- hearing intact Oropharynx- clear Neck- supple, no JVP Lymph- no cervical lymphadenopathy Lungs- Clear to ausculation bilaterally, normal work of breathing Heart- irregular rate and rhythm, no murmurs, rubs or gallops, PMI not laterally displaced GI- soft, NT, ND, + BS Extremities- no clubbing, cyanosis, or edema MS- no  significant deformity or atrophy Skin- no rash or lesion Psych- euthymic mood, full affect Neuro- strength and sensation are intact  EKG-Afib at 90 bpm, qrs int 110 ms Echo-  *[facility]*  [address], [place] [address]  [phone]  ------------------------------------------------------------------- Transthoracic Echocardiography  Patient: Frank Morales, Frank Morales MR #: [id number] Study Date: 08/18/2015 Gender: M Age: 71 Height: 180.3 cm Weight: 155.7 kg BSA: 2.87 m^2 Pt. Status: Room: 2W37C  ADMITTING Annia Belt ATTENDING Annia Belt ORDERING Annia Belt REFERRING [place], [place], Inpatient SONOGRAPHER Darlina Sicilian, RDCS  cc:  ------------------------------------------------------------------- LV EF: 50% - 55%  ------------------------------------------------------------------- Indications: Atrial flutter 427.32.  ------------------------------------------------------------------- History: PMH: Chest pain. Dyspnea. PMH: Syncope. Risk factors: Current tobacco use.  ------------------------------------------------------------------- Study Conclusions  - Left ventricle: Difficult acoustical windows make assessment of  wall motion difficult even with definity. Study is suggestive of  apical hypokinesis. The cavity size was normal. There was  moderate focal basal and mild concentric hypertrophy. Systolic  function was normal. The estimated ejection fraction was in the  range of 50% to 55%. Diffuse hypokinesis. The study was not  technically sufficient to allow evaluation of LV diastolic  dysfunction due to atrial fibrillation. - Aortic valve: Trileaflet; mildly  thickened, mildly calcified  leaflets. - Mitral valve: There was trivial regurgitation. - Left atrium: The atrium was severely dilated. - Right atrium: The atrium was severely dilated. - Tricuspid valve: There was mild regurgitation. - Pulmonary arteries: PA peak pressure: 37 mm Hg (S).  Impressions:  - The right ventricular systolic pressure was increased consistent  with mild pulmonary hypertension.   08/21/15  Cath-Angiographically minimal coronary artery disease. Large draping vessels. Several very tortuous  Mildly elevated secondary pulmonary hypertension  Mildly elevated signal April medication for elevated LVEDP. Suggest more diastolic dysfunction  Assessment and Plan: 1. Symptomatic  Afib/flutter Failed cardioversion Think chance of restoring to SR is very low at this point due to severely  dilated left atrium and morbid obesity Continue rate control with diltiazem 240 mg With elevated liver enzymes and new dx of hep c,treatment pending, do not think is the time to start AAD, esp amiodarone  2. Lifestyle issues Encouraged weight loss Weight management class this Friday, highly recommended Encouraged increased activity, but states too short of breath to do much Smoking cessation encouraged Denies alcohol, caffeine use States no cocaine x one month, used 3 years in past Snores, sleep study scheduled  3. HF Limit fluids Limit salt Weigh daily  F/u in 3 months  Butch Penny C. Tyerra Loretto, [facility] [address] [place], [place] [address] [phone]

## 2015-09-29 ENCOUNTER — Other Ambulatory Visit (HOSPITAL_COMMUNITY): Payer: Self-pay | Admitting: *Deleted

## 2015-09-29 ENCOUNTER — Telehealth: Payer: Self-pay | Admitting: *Deleted

## 2015-09-29 ENCOUNTER — Ambulatory Visit: Payer: No Typology Code available for payment source | Admitting: Pharmacist

## 2015-09-29 ENCOUNTER — Ambulatory Visit: Payer: No Typology Code available for payment source

## 2015-09-29 NOTE — Telephone Encounter (Signed)
-----   Message from Fay Records, MD sent at 09/24/2015 10:40 PM EDT ----- Electrolytes are OK Keep on same meds

## 2015-09-29 NOTE — Telephone Encounter (Signed)
Called patient to review lab results.  He verbalized that he was called yesterday and told not to pick up the metoprolol that was sent to pharmacy.  He was seen in AFIB clinic. This medication is on his med list. Will send to AFIB clinic for verification.

## 2015-10-02 ENCOUNTER — Ambulatory Visit (INDEPENDENT_AMBULATORY_CARE_PROVIDER_SITE_OTHER): Payer: No Typology Code available for payment source | Admitting: Internal Medicine

## 2015-10-02 VITALS — BP 152/99 | HR 96 | Temp 98.0°F | Ht 71.0 in | Wt 344.9 lb

## 2015-10-02 DIAGNOSIS — Z599 Problem related to housing and economic circumstances, unspecified: Secondary | ICD-10-CM

## 2015-10-02 DIAGNOSIS — R160 Hepatomegaly, not elsewhere classified: Secondary | ICD-10-CM

## 2015-10-02 DIAGNOSIS — Z79899 Other long term (current) drug therapy: Secondary | ICD-10-CM

## 2015-10-02 DIAGNOSIS — Z6841 Body Mass Index (BMI) 40.0 and over, adult: Secondary | ICD-10-CM

## 2015-10-02 DIAGNOSIS — Z7901 Long term (current) use of anticoagulants: Secondary | ICD-10-CM

## 2015-10-02 DIAGNOSIS — I10 Essential (primary) hypertension: Secondary | ICD-10-CM

## 2015-10-02 DIAGNOSIS — B182 Chronic viral hepatitis C: Secondary | ICD-10-CM

## 2015-10-02 DIAGNOSIS — I5032 Chronic diastolic (congestive) heart failure: Secondary | ICD-10-CM

## 2015-10-02 DIAGNOSIS — I4892 Unspecified atrial flutter: Secondary | ICD-10-CM

## 2015-10-02 DIAGNOSIS — I110 Hypertensive heart disease with heart failure: Secondary | ICD-10-CM

## 2015-10-02 DIAGNOSIS — F17210 Nicotine dependence, cigarettes, uncomplicated: Secondary | ICD-10-CM

## 2015-10-02 MED ORDER — FUROSEMIDE 40 MG PO TABS: 40.0000 mg | ORAL_TABLET | Freq: Two times a day (BID) | ORAL | Status: DC

## 2015-10-02 MED ORDER — RIVAROXABAN 20 MG PO TABS: 20.0000 mg | ORAL_TABLET | Freq: Every day | ORAL | Status: DC

## 2015-10-02 NOTE — Assessment & Plan Note (Addendum)
His heart rate is irregularly irregular on exam today. She denies any palpitations or symptoms of racing heart. Her rate is controlled currently with diltiazem 240 mg extended release. He is monitored by cardiology. Recent attempt at cardioversion was unsuccessful. He is unable to tolerate other antiarrhythmics, due to his current hepatitis.  He is also on Xarelto 20 mg daily. He currently has only 4 pills remaining. In light of his financial situation, we have provided him with 2 samples of Xarelto 20mg  #7 pills each. Last him through his scheduled follow-up appointment in 2 weeks. We hope that at this time his medication assistance will be finalized.  We will continue his current medication and monitor for now.

## 2015-10-02 NOTE — Patient Instructions (Signed)
New shortness of breath and swelling in her lower extremities is most likely due to fluid accumulation in the body. We will like for you to increase the amount of fluid pills that you're taking 2 twice daily. Furosemide 40 mg 2 times every day. He should be able to get more of this medication at the health department with a large car. Patient is not able to acquire this medication please let us know if this is very important to prevent you having problems with breathing and ending up in the hospital.  We would also like for you to continue to take your cardia medication for your heart rate as well as your Cymbalta medication to continue blood. These medications are also very important to protect your heart. We provided you with 2 samples of this are also which will last her for 2 weeks until your next appointment. At that time, her application for medication assistance should be completed. And we will be able to prescribe the medications with financial assistance.  Please weigh yourself as often as possible. If you notice weight gain of more than 1-2 pounds in a day or more than 5 pounds in a week please let us know so that we can adjust her fluid medication accordingly.

## 2015-10-02 NOTE — Progress Notes (Signed)
   CC: Follow-up of CHF and atrial flutter HPI: [name] is a 55 y.o. male with a h/o of hypertension, congestive heart failure, and atrial flutter who presents for weight check and assessment of fluid status.  Please see details in the Assessment and Plan for the status of the patients chronic medical conditions.  Past Medical History  Diagnosis Date  . Smoker   . Abscess and cellulitis    Current Outpatient Rx  Name  Route  Sig  Dispense  Refill  . diltiazem (CARDIZEM CD) 240 MG 24 hr capsule   Oral   Take 1 capsule (240 mg total) by mouth daily. 340B Chris   30 capsule   0   . furosemide (LASIX) 40 MG tablet   Oral   Take 1 tablet (40 mg total) by mouth 2 (two) times daily. 340B Chris   90 tablet   0   . rivaroxaban (XARELTO) 20 MG TABS tablet   Oral   Take 1 tablet (20 mg total) by mouth daily with supper.   90 tablet   0    Review of Systems: ROS in HPI. Otherwise, Chest pain, shortness of breath at rest, changes in appetite, changes to bowel habits, or new symptoms, muscle fatigue, weakness.  Physical Exam: Filed Vitals:   10/02/15 1417 10/02/15 1418  BP: 152/99   Pulse: 96   Temp: 98 F (36.7 C)   TempSrc: Oral   Height: 5\' 11"  (1.803 m)   Weight: 344 lb 14.4 oz (156.446 kg) 344 lb 14.4 oz (156.446 kg)  SpO2: 99%    General appearance: alert, cooperative, appears stated age and morbidly obese Head: Normocephalic, without obvious abnormality, atraumatic Lungs: scant crackles at bilateral bases, normal WOB at rest, no wheezes Heart: irregularly irregular rhythm and poorly appreciated S2 Abdomen: protuberant abdoment which was moderately tense, but non-ascitic, prominent hepatomegaly, non-TTP, BS normal Extremities: edema 3+ bilaterally  Assessment & Plan:  See encounters tab for problem based medical decision making. Patient seen with Dr. Angelia Mould  Signed: Holley Raring, MD 10/02/2015, 4:45 PM  Pager: [phone]

## 2015-10-02 NOTE — Assessment & Plan Note (Signed)
Patient's blood pressures are elevated today at 152/99. Has a mildly elevated blood pressures the last several months. He will set home blood pressures are within normal limits. We will continue to monitor this and treat with lifestyle modifications. We have also increased his dose of Lasix for CHF and expect that to have mild impact on his blood pressure. Reevaluate at his next visit with his PCP.

## 2015-10-02 NOTE — Assessment & Plan Note (Signed)
Referral to ID is under way. Patient is scheduled for electrographic E on the 20th of this month. Notable hepatomegaly on exam. We'll follow-up after consultation with ID.

## 2015-10-02 NOTE — Assessment & Plan Note (Signed)
Patient reports significant dyspnea on exertion, but denies any shortness of breath at rest. He also notes an increase in his lower extremity edema. This swelling is dependent and resolves itself with prolonged elevation of his lower extremities. His edema is 3+ today and his lung exam reveals minimal crackles.  He is currently on 40 mg of Lasix once daily. His cardiologist recommended to increase this to twice a day dosing, however given his financial situation he has been unable to obtain enough medication for 2 times daily dosing. We recommended that he attempt to obtain his Lasix from the health department at an affordable cost in order to increase his dose to twice daily. He has submitted medication assistance paperwork today and will hopefully have this application finalized shortly. I have placed a consultation to clinical pharmacy in order to facilitate this.  He was given instructions on what his weight, and he reports that he has access to a scale several times a week. He was instructed that he should notify our clinic if he has weight gain of greater than 1-2 pounds in a day or greater than 5 pounds in a week. Follow-up with our clinic in 2 weeks for weight check and reassessment of his fluid status.

## 2015-10-03 NOTE — Progress Notes (Signed)
Internal Medicine Clinic Attending  I saw and evaluated the patient.  I personally confirmed the key portions of the history and exam documented by Dr. Gay Filler and I reviewed pertinent patient test results.  The assessment, diagnosis, and plan were formulated together and I agree with the documentation in the resident's note. Patient has difficulty with access to his medications.  Fortunately we were able to provide him with some samples of xarelto for the next 2 weeks while we work on putting him into contact with our clinical pharmacist Dr Maudie Mercury for further assistance.

## 2015-10-03 NOTE — Addendum Note (Signed)
Addended by: Lucious Groves on: 10/03/2015 09:46 AM   Modules accepted: Level of Service

## 2015-10-04 ENCOUNTER — Other Ambulatory Visit: Payer: No Typology Code available for payment source

## 2015-10-05 ENCOUNTER — Other Ambulatory Visit: Payer: No Typology Code available for payment source

## 2015-10-05 ENCOUNTER — Ambulatory Visit (HOSPITAL_COMMUNITY)
Admission: RE | Admit: 2015-10-05 | Discharge: 2015-10-05 | Disposition: A | Discharge status: Home / Self Care | Payer: No Typology Code available for payment source | Source: Ambulatory Visit | Attending: Internal Medicine | Admitting: Internal Medicine

## 2015-10-05 DIAGNOSIS — B182 Chronic viral hepatitis C: Secondary | ICD-10-CM

## 2015-10-06 LAB — HEPATITIS A ANTIBODY, TOTAL: Hep A Total Ab: REACTIVE — AB

## 2015-10-06 LAB — AFP TUMOR MARKER: AFP TUMOR MARKER: 6.5 ng/mL — AB (ref <6.1)

## 2015-10-06 LAB — HEPATITIS B CORE ANTIBODY, TOTAL: HEP B C TOTAL AB: NONREACTIVE

## 2015-10-10 LAB — HCV RNA,LIPA RFLX NS5A DRUG RESIST

## 2015-10-12 LAB — HCV RNA NS5A DRUG RESISTANCE

## 2015-10-16 ENCOUNTER — Ambulatory Visit (INDEPENDENT_AMBULATORY_CARE_PROVIDER_SITE_OTHER): Payer: No Typology Code available for payment source | Admitting: Internal Medicine

## 2015-10-16 VITALS — BP 123/89 | HR 74 | Temp 98.1°F | Ht 71.0 in | Wt 342.9 lb

## 2015-10-16 DIAGNOSIS — B182 Chronic viral hepatitis C: Secondary | ICD-10-CM

## 2015-10-16 DIAGNOSIS — I110 Hypertensive heart disease with heart failure: Secondary | ICD-10-CM

## 2015-10-16 DIAGNOSIS — I5032 Chronic diastolic (congestive) heart failure: Secondary | ICD-10-CM

## 2015-10-16 DIAGNOSIS — I4892 Unspecified atrial flutter: Secondary | ICD-10-CM

## 2015-10-16 DIAGNOSIS — Z79899 Other long term (current) drug therapy: Secondary | ICD-10-CM

## 2015-10-16 DIAGNOSIS — I10 Essential (primary) hypertension: Secondary | ICD-10-CM

## 2015-10-16 MED ORDER — DILTIAZEM HCL ER COATED BEADS 240 MG PO CP24: 240.0000 mg | ORAL_CAPSULE | Freq: Every day | ORAL | 1 refills | Status: DC

## 2015-10-16 MED ORDER — RIVAROXABAN 20 MG PO TABS: 20.0000 mg | ORAL_TABLET | Freq: Every day | ORAL | 0 refills | Status: DC

## 2015-10-16 MED ORDER — FUROSEMIDE 40 MG PO TABS: 40.0000 mg | ORAL_TABLET | Freq: Two times a day (BID) | ORAL | 1 refills | Status: DC

## 2015-10-16 NOTE — Progress Notes (Signed)
CC: Follow-up heart failure, hypertension HPI: Frank Morales is a 55 y.o. male with a h/o of hypertension, heart failure, atrial flutter, hepatitis C who presents for follow-up of his heart failure and high blood pressure.  Please see Problem-based charting for HPI and the status of patient's chronic medical conditions.  Past Medical History:  Diagnosis Date  . Abscess and cellulitis   . Smoker     Social Hx: Recently acquired medication insurance.  Review of Systems: ROS in HPI. Otherwise: Review of Systems  Constitutional: Negative for chills, fever and weight loss.  Respiratory: Negative for cough and shortness of breath.   Cardiovascular: Negative for chest pain and leg swelling.  Gastrointestinal: Negative for abdominal pain, constipation, diarrhea, nausea and vomiting.  Genitourinary: Negative for dysuria, frequency and urgency.    Physical Exam: Vitals:   10/16/15 1104 10/16/15 1144  BP: (!) 164/105 123/89  Pulse: 74   Temp: 98.1 F (36.7 C)   TempSrc: Oral   SpO2: 100%   Weight: (!) 342 lb 14.4 oz (155.5 kg)   Height: 5\' 11"  (1.803 m)    Physical Exam  Constitutional: He is cooperative. No distress.  Cardiovascular: Normal rate, regular rhythm, normal heart sounds and normal pulses.  Exam reveals no gallop.   No murmur heard. Pulmonary/Chest: Effort normal and breath sounds normal. No respiratory distress. He has no wheezes. He has no rhonchi. He has no rales.  Abdominal: Soft. Bowel sounds are normal. There is no tenderness.  Musculoskeletal: He exhibits no edema.  Skin: Skin is intact.    Assessment & Plan:  See encounters tab for problem based medical decision making. Patient seen with Dr. Daryll Drown  Hypertension BP Readings from Last 3 Encounters:  10/16/15 123/89  10/02/15 (!) 152/99  09/28/15 (!) 144/72       Component Value Date/Time   NA 141 09/22/2015 1141   K 3.9 09/22/2015 1141   CREATININE 0.70 09/22/2015 1141   CREATININE  0.72 09/22/2015 0902    Assessment:  Patient with HTN who is compliant with anti-hypertensive therapy. Denies headaches, chest pain, edema, dizziness or lightheadedness. Patient was initially hypertensive on presentation after brisk walk from the parking deck. On repeat blood pressure monitoring he was within normal limits. Review of his home blood pressure cuff reveals well-controlled systolic blood pressure but elevated diastolic pressures frequently in the 90s to low 100s. Plan:   -Medication changes: We'll defer adding antihypertensive for PCP  -Labs: N/A -Other plans: Continue home diuresis with Lasix 40 mg twice a day. Would recommend re-evaluation at his next visit and consider adding additional antihypertensive medication at that time.  -Encourage home BP monitoring 3 times per week or at drug store occasionally  -Encourage regular exercise and healthy diet (decreased salt intake)  Medications after today's visit: 1. Furosemide 40 mg twice a day  Hepatitis C, chronic (HCC) Patient is an appointment with infectious disease for his hepatitis C upcoming on Wednesday of this week. I encouraged patient to keep this appointment.  Diastolic CHF ([place]) Patient has no acute complaints today and denies shortness of breath, LE swelling. He has lost 2 pounds since his last appointment 2 weeks ago. She is to take his furosemide 40 mg twice a day consistently. And reports no stomach and side effects from this. He does endorse that he has been taking his total daily dose of 80 mg Lasix at one time around 12 PM daily. He has not suffered any adverse effects from this aberrant  dosing. Specifically denies any lightheadedness dizziness, or changes to urination. I have counseled him on the importance of taking his furosemide in twice a day dosing, in order to prevent risk of hypotension and/or kidney damage. He is agreeable to this, and will begin to take his furosemide every morning and with  lunch.  Atrial flutter [facility]) Patient has a follow-up cardiology appointment on Thursday of this week. I advised he keep this appointment. He has been running low on his diltiazem and I have refilled this today.   Signed: Holley Raring, MD 10/16/2015, 12:08 PM  Pager: [phone]

## 2015-10-16 NOTE — Assessment & Plan Note (Signed)
Patient has a follow-up cardiology appointment on Thursday of this week. I advised he keep this appointment. He has been running low on his diltiazem and I have refilled this today.

## 2015-10-16 NOTE — Patient Instructions (Signed)
Continue taking medications as prescribed. Your doing well with your weight and I think this is a treated to your Lasix medication reducing the fluid in your body. Please continue to weigh yourself when you can, and make note if your weight is increasing rapidly gaining more than 1-2 pounds per day or more than 5 pounds in a week. Also continue to take her blood pressure as regularly as he can. Record these values for [place] to examine as her next visit. Your blood pressure is well controlled today, but on review of her past blood pressures the bottom number has been somewhat high. Please continue to avoid high salt foods and continue to increase her activity as this will help lower your blood pressure. If we need further medications to stabilize her blood pressure in a safe range, we can reassess this with cardiology and your primary doctor at her next visit.  Please make sure to keep all of your appointments this coming month as these will be important to continue keeping a healthy.

## 2015-10-16 NOTE — Progress Notes (Signed)
Internal Medicine Clinic Attending  I saw and evaluated the patient.  I personally confirmed the key portions of the history and exam documented by Dr. Strelow and I reviewed pertinent patient test results.  The assessment, diagnosis, and plan were formulated together and I agree with the documentation in the resident's note. 

## 2015-10-16 NOTE — Assessment & Plan Note (Signed)
Patient is an appointment with infectious disease for his hepatitis C upcoming on Wednesday of this week. I encouraged patient to keep this appointment.

## 2015-10-16 NOTE — Assessment & Plan Note (Signed)
BP Readings from Last 3 Encounters:  10/16/15 123/89  10/02/15 (!) 152/99  09/28/15 (!) 144/72       Component Value Date/Time   NA 141 09/22/2015 1141   K 3.9 09/22/2015 1141   CREATININE 0.70 09/22/2015 1141   CREATININE 0.72 09/22/2015 0902    Assessment:  Patient with HTN who is compliant with anti-hypertensive therapy. Denies headaches, chest pain, edema, dizziness or lightheadedness. Patient was initially hypertensive on presentation after brisk walk from the parking deck. On repeat blood pressure monitoring he was within normal limits. Review of his home blood pressure cuff reveals well-controlled systolic blood pressure but elevated diastolic pressures frequently in the 90s to low 100s. Plan:   -Medication changes: We'll defer adding antihypertensive for PCP  -Labs: N/A -Other plans: Continue home diuresis with Lasix 40 mg twice a day. Would recommend re-evaluation at his next visit and consider adding additional antihypertensive medication at that time.  -Encourage home BP monitoring 3 times per week or at drug store occasionally  -Encourage regular exercise and healthy diet (decreased salt intake)  Medications after today's visit: 1. Furosemide 40 mg twice a day

## 2015-10-16 NOTE — Assessment & Plan Note (Signed)
Patient has no acute complaints today and denies shortness of breath, LE swelling. He has lost 2 pounds since his last appointment 2 weeks ago. She is to take his furosemide 40 mg twice a day consistently. And reports no stomach and side effects from this. He does endorse that he has been taking his total daily dose of 80 mg Lasix at one time around 12 PM daily. He has not suffered any adverse effects from this aberrant dosing. Specifically denies any lightheadedness dizziness, or changes to urination. I have counseled him on the importance of taking his furosemide in twice a day dosing, in order to prevent risk of hypotension and/or kidney damage. He is agreeable to this, and will begin to take his furosemide every morning and with lunch.

## 2015-10-18 ENCOUNTER — Ambulatory Visit (INDEPENDENT_AMBULATORY_CARE_PROVIDER_SITE_OTHER): Payer: No Typology Code available for payment source | Admitting: Internal Medicine

## 2015-10-18 ENCOUNTER — Encounter: Payer: Self-pay | Admitting: Internal Medicine

## 2015-10-18 VITALS — BP 147/86 | HR 59 | Temp 97.8°F | Ht 71.0 in | Wt 331.0 lb

## 2015-10-18 DIAGNOSIS — Z23 Encounter for immunization: Secondary | ICD-10-CM

## 2015-10-18 DIAGNOSIS — B182 Chronic viral hepatitis C: Secondary | ICD-10-CM

## 2015-10-18 MED ORDER — ELBASVIR-GRAZOPREVIR 50-100 MG PO TABS: 1.0000 | ORAL_TABLET | Freq: Every day | ORAL | 3 refills | Status: DC

## 2015-10-18 MED ORDER — RIBAVIRIN 200 MG PO CAPS: 200.0000 mg | ORAL_CAPSULE | Freq: Two times a day (BID) | ORAL | 3 refills | Status: DC

## 2015-10-18 NOTE — Patient Instructions (Signed)
Date 10/18/15  Dear Mr. Angelos, As discussed in the [facility], your hepatitis C therapy will include the following medications:          Zepatier (elbasvir 50 mg/grazoprevir 100 mg) for      16 weeks with ribavirin   Please note that ALL MEDICATIONS WILL START ON THE SAME DATE for a total of 12 weeks. ---------------------------------------------------------------- Your HCV Treatment Start Date: TBA   Your HCV genotype:  1a    Liver Fibrosis: TBD    ---------------------------------------------------------------- YOUR PHARMACY CONTACT:   [place] [facility] [place] Phone: [phone] Hours: Monday to Friday 7:30 am to 6:00 pm   Please always contact your pharmacy at least 3-4 business days before you run out of medications to ensure your next month's medication is ready or 1 week prior to running out if you receive it by mail.  Remember, each prescription is for 28 days. ---------------------------------------------------------------- GENERAL NOTES REGARDING YOUR HEPATITIS C MEDICATION:  ZEPATIER is available as a beige-colored, oval-shaped, film-coated tablet debossed with "770" on one side and plain on the other. Each tablet contains 50 mg elbasvir and 100 mg grazoprevir.   Common side effects of ZEPATIER when used without ribavirin include: - feeling tired -trouble sleeping - headache -diarrhea - nausea  Common side effects of ZEPATIER when used with ribavirin include: - low red blood cell counts (anemia) - feeling irritable - headache - stomach pain - feeling tired - depression - shortness of breath - joint pain - rash or itching   Please note that this only lists the most common side effects and is NOT a comprehensive list of the potential side effects of these medications. For more information, please review the drug information sheets that come with your medication package from the pharmacy.   ---------------------------------------------------------------- GENERAL HELPFUL HINTS ON HCV THERAPY: 1. Stay well-hydrated. 2. Notify the [facility] of any changes in your other over-the-counter/herbal or prescription medications. 3. If you miss a dose of your medication, take the missed dose as soon as you remember. Return to your regular time/dose schedule the next day.  4.  Do not stop taking your medications without first talking with your healthcare provider. 5.  You may take Tylenol (acetaminophen), as long as the dose is less than 2000 mg (OR no more than 4 tablets of the Tylenol Extra Strengths 500mg  tablet) in 24 hours. 6.  You will see our pharmacist-specialist within the first 2 weeks of starting your medication. 7.  You will need to obtain routine labs around week 4 and12 weeks after starting and then 3 to 6 months after finishing Zepatier.   8.  If ribavirin is part of your regimen, you also will have a lab visit within 2 weeks.   Scharlene Gloss, [place] for [place] [address] [place], [place]  [phone]

## 2015-10-18 NOTE — Progress Notes (Signed)
[facility] for Infectious Disease   CC: consideration for treatment for chronic hepatitis C  HPI:  +Frank Morales is a 55 y.o. male who presents for initial evaluation and management of chronic hepatitis C.  Patient tested positive this year for a screening test. Hepatitis C-associated risk factors present are: IV drug abuse (details: remote history over 30 years ago). Patient denies renal dialysis, sexual contact with person with liver disease. Patient has had other studies performed. Results: hepatitis C RNA by PCR, result: positive. Patient has not had prior treatment for Hepatitis C. Patient does not have a past history of liver disease. Patient does not have a family history of liver disease. Patient does not  have associated signs or symptoms related to liver disease.  Labs reviewed and confirm chronic hepatitis C with a positive viral load.   Records reviewed from PCP, on Xarelto.       Patient does have documented immunity to Hepatitis A. Patient does not have documented immunity to Hepatitis B.    Review of Systems:  Constitutional: negative for malaise Musculoskeletal: negative for myalgias and arthralgias All other systems reviewed and are negative      Past Medical History:  Diagnosis Date  . Abscess and cellulitis   . Smoker     Prior to Admission medications   Medication Sig Start Date End Date Taking? Authorizing Provider  diltiazem (CARDIZEM CD) 240 MG 24 hr capsule Take 1 capsule (240 mg total) by mouth daily. 340B Gerald Stabs 10/16/15  Yes Holley Raring, MD  furosemide (LASIX) 40 MG tablet Take 1 tablet (40 mg total) by mouth 2 (two) times daily. 340B Gerald Stabs 10/16/15  Yes Holley Raring, MD  rivaroxaban (XARELTO) 20 MG TABS tablet Take 1 tablet (20 mg total) by mouth daily with supper. 10/16/15  Yes Holley Raring, MD  Elbasvir-Grazoprevir (ZEPATIER) 50-100 MG TABS Take 1 tablet by mouth daily. 10/18/15   Thayer Headings, MD  ribavirin (REBETOL) 200 MG capsule Take 1  capsule (200 mg total) by mouth 2 (two) times daily. 10/18/15   Thayer Headings, MD    No Known Allergies  Social History  Substance Use Topics  . Smoking status: Current Every Day Smoker    Packs/day: 0.30    Types: Cigarettes  . Smokeless tobacco: Never Used     Comment: 3 -4 cigs/day.  . Alcohol use 3.0 oz/week    5 Standard drinks or equivalent per week     Comment: occ beer.    Family History  Problem Relation Age of Onset  . Hypertension Mother   . Hypertension Maternal Grandfather   . Diabetes Mother   . Pneumonia Father   No cirrhosis, no liver cancer   Objective:  Constitutional: in no apparent distress and alert,  Vitals:   10/18/15 0915  BP: (!) 147/86  Pulse: (!) 59  Temp: 97.8 F (36.6 C)   Eyes: anicteric Cardiovascular: Cor RRR and No murmurs Respiratory: CTA B; normal respiratory effort Gastrointestinal: Bowel sounds are normal, liver is not enlarged, spleen is not enlarged Musculoskeletal: no pedal edema noted Skin: negatives: no rash; no porphyria cutanea tarda Lymphatic: no cervical lymphadenopathy   Laboratory Genotype: No results found for: HCVGENOTYPE HCV viral load:  Lab Results  Component Value Date   HCVQUANT 508,000 08/19/2015   Lab Results  Component Value Date   WBC 5.4 09/08/2015   HGB 15.0 09/22/2015   HCT 44.0 09/22/2015   MCV 90.3 09/08/2015   PLT 115 (L)  09/08/2015    Lab Results  Component Value Date   CREATININE 0.70 09/22/2015   BUN 11 09/22/2015   NA 141 09/22/2015   K 3.9 09/22/2015   CL 104 09/22/2015   CO2 28 09/22/2015    Lab Results  Component Value Date   ALT 70 (H) 08/19/2015   AST 73 (H) 08/19/2015   ALKPHOS 57 08/19/2015     Labs and history reviewed and show CHILD-PUGH A  5-6 points: Child class A 7-9 points: Child class B 10-15 points: Child class C  Lab Results  Component Value Date   INR 1.12 08/21/2015   BILITOT 0.6 08/19/2015   ALBUMIN 3.1 (L) 08/19/2015     Assessment: New Patient  with Chronic Hepatitis C genotype 1A, untreated.  I discussed with the patient the lab findings that confirm chronic hepatitis C as well as the natural history and progression of disease including about 30% of people who develop cirrhosis of the liver if left untreated and once cirrhosis is established there is a 2-7% risk per year of liver cancer and liver failure.  I discussed the importance of treatment and benefits in reducing the risk, even if significant liver fibrosis exists.   Plan: 1) Patient counseled extensively on limiting acetaminophen to no more than 2 grams daily, avoidance of alcohol. 2) Transmission discussed with patient including sexual transmission, sharing razors and toothbrush.   3) Will need referral to gastroenterology if concern for cirrhosis 4) Will need referral for substance abuse counseling: No.; Further work up to include urine drug screen  No. 5) Will prescribe Zepatier for 12 weeks or 16 weeks with ribavirin if any NS5A resistance found 6) Hepatitis A vaccine No. 7) Hepatitis B vaccine Yes.   8) Pneumovax vaccine if concern for cirrhosis 9) Further work up to include liver staging with elastography 10) NS5A test  Yes.   and resistance noted. 10) will follow up after starting medication; discussed with pharmacy as well and interaction with Xarelto minimal with Zepatier.

## 2015-10-18 NOTE — Progress Notes (Deleted)
Cardiology Office Note:    Date:  10/18/2015   ID:  Frank Morales, DOB [birth year], MRN [id number]  PCP:  Frank Nims, MD  Cardiologist:  Dr. Dorris Morales   Electrophysiologist:  n/a  Referring MD: Frank Nims, MD   No chief complaint on file. s/p DCCV for AF/FL  History of Present Illness:    Frank Morales is a 55 y.o. male with a hx of ***    Prior CV studies that were reviewed today include:    R/L [facility] 08/21/15  Angiographically minimal coronary artery disease. Large draping vessels. Several very tortuous  Mildly elevated secondary pulmonary hypertension Mildly elevated signal April medication for elevated LVEDP. Suggest more diastolic dysfunction PA Systolic Pressure 45 mmHg   LV EDP 21 mmHg  Echo 08/18/15 - Left ventricle: Difficult acoustical windows make assessment of   wall motion difficult even with definity. Study is suggestive of   apical hypokinesis. The cavity size was normal. There was   moderate focal basal and mild concentric hypertrophy. Systolic   function was normal. The estimated ejection fraction was in the   range of 50% to 55%. Diffuse hypokinesis. The study was not   technically sufficient to allow evaluation of LV diastolic   dysfunction due to atrial fibrillation. - Aortic valve: Trileaflet; mildly thickened, mildly calcified   leaflets. - Mitral valve: There was trivial regurgitation. - Left atrium: The atrium was severely dilated. - Right atrium: The atrium was severely dilated. - Tricuspid valve: There was mild regurgitation. - Pulmonary arteries: PA peak pressure: 37 mm Hg (S).   Impressions:   - The right ventricular systolic pressure was increased consistent   with mild pulmonary hypertension.   Past Medical History:  Diagnosis Date  . Abscess and cellulitis   . Smoker     Past Surgical History:  Procedure Laterality Date  . CARDIAC CATHETERIZATION N/A 08/21/2015   Procedure: Right/Left Heart Cath and Coronary  Angiography;  Surgeon: Frank Man, MD;  Location: [facility] LAB;  Service: Cardiovascular;  Laterality: N/A;  . CARDIOVERSION N/A 09/22/2015   Procedure: CARDIOVERSION;  Surgeon: Frank Hector, MD;  Location: [facility] ENDOSCOPY;  Service: Cardiovascular;  Laterality: N/A;  . NO PAST SURGERIES      Current Medications: Outpatient Medications Prior to Visit  Medication Sig Dispense Refill  . diltiazem (CARDIZEM CD) 240 MG 24 hr capsule Take 1 capsule (240 mg total) by mouth daily. 340B Chris 60 capsule 1  . Elbasvir-Grazoprevir (ZEPATIER) 50-100 MG TABS Take 1 tablet by mouth daily. 28 tablet 3  . furosemide (LASIX) 40 MG tablet Take 1 tablet (40 mg total) by mouth 2 (two) times daily. 340B Chris 120 tablet 1  . ribavirin (REBETOL) 200 MG capsule Take 1 capsule (200 mg total) by mouth 2 (two) times daily. 168 capsule 3  . rivaroxaban (XARELTO) 20 MG TABS tablet Take 1 tablet (20 mg total) by mouth daily with supper. 90 tablet 0   No facility-administered medications prior to visit.       Allergies:   Review of patient's allergies indicates no known allergies.   Social History   Social History  . Marital status: Single    Spouse name: N/A  . Number of children: N/A  . Years of education: N/A   Occupational History  . unemployed     Previous Training and development officer    Social History Main Topics  . Smoking status: Current Every Day Smoker    Packs/day: 0.30  Types: Cigarettes  . Smokeless tobacco: Never Used     Comment: 3 -4 cigs/day.  . Alcohol use 3.0 oz/week    5 Standard drinks or equivalent per week     Comment: occ beer.  . Drug use: No  . Sexual activity: Not on file   Other Topics Concern  . Not on file   Social History Narrative  . No narrative on file     Family History:  The patient's ***family history includes Diabetes in his mother; Hypertension in his maternal grandfather and mother; Pneumonia in his father.   ROS:   Please see the history of present illness.    ROS  All other systems reviewed and are negative.   EKGs/Labs/Other Test Reviewed:    EKG:  EKG is *** ordered today.  The ekg ordered today demonstrates ***  Recent Labs: 08/17/2015: B Natriuretic Peptide 251.8; TSH 1.713 08/19/2015: ALT 70 08/21/2015: Magnesium 2.1 09/08/2015: Platelets 115 09/22/2015: BUN 11; Creatinine, Ser 0.70; Hemoglobin 15.0; Potassium 3.9; Sodium 141   Recent Lipid Panel    Component Value Date/Time   CHOL 105 08/17/2015 1233   TRIG 182 (H) 08/17/2015 1233   HDL 28 (L) 08/17/2015 1233   CHOLHDL 3.8 08/17/2015 1233   VLDL 36 08/17/2015 1233   LDLCALC 41 08/17/2015 1233     Physical Exam:    VS:  There were no vitals taken for this visit.    Wt Readings from Last 3 Encounters:  10/18/15 (!) 331 lb (150.1 kg)  10/16/15 (!) 342 lb 14.4 oz (155.5 kg)  10/02/15 (!) 344 lb 14.4 oz (156.4 kg)     ***Physical Exam  ASSESSMENT:    1. Atrial flutter, unspecified type ([place])   2. Chronic diastolic CHF (congestive heart failure) ([place])   3. Essential hypertension   4. Morbid obesity due to excess calories ([place])   5. Chronic hepatitis C without hepatic coma (HCC)    PLAN:    In order of problems listed above:  1. ***   Medication Adjustments/Labs and Tests Ordered: Current medicines are reviewed at length with the patient today.  Concerns regarding medicines are outlined above.  Medication changes, Labs and Tests ordered today are outlined in the Patient Instructions noted below. There are no Patient Instructions on file for this visit. Signed, Richardson Dopp, PA-C  10/18/2015 5:23 PM    [facility] [place], [place], [place]  [address] Phone: [phone]; Fax: [phone]

## 2015-10-19 ENCOUNTER — Ambulatory Visit: Payer: No Typology Code available for payment source | Admitting: Physician Assistant

## 2015-10-19 ENCOUNTER — Encounter: Payer: Self-pay | Admitting: Pharmacy Technician

## 2015-10-19 MED FILL — RIBAVIRIN 200 MG TABLET: 200 | 84 days supply | Qty: 168 | Fill #0

## 2015-10-24 ENCOUNTER — Ambulatory Visit: Payer: No Typology Code available for payment source | Admitting: Pharmacist

## 2015-10-24 DIAGNOSIS — Z79899 Other long term (current) drug therapy: Secondary | ICD-10-CM

## 2015-10-24 NOTE — Patient Instructions (Signed)
Patient states Frank Morales no longer carries diltiazem. Referred patient to PAP, signed off by Dr. Genene Churn. Patient advised to contact me if further medication help is needed.

## 2015-10-25 ENCOUNTER — Encounter: Payer: Self-pay | Admitting: Physician Assistant

## 2015-10-26 NOTE — Telephone Encounter (Signed)
review 

## 2015-10-30 ENCOUNTER — Telehealth: Payer: Self-pay

## 2015-10-30 ENCOUNTER — Other Ambulatory Visit: Payer: Self-pay | Admitting: Internal Medicine

## 2015-10-30 ENCOUNTER — Encounter: Payer: Self-pay | Admitting: Pharmacy Technician

## 2015-10-30 DIAGNOSIS — I10 Essential (primary) hypertension: Secondary | ICD-10-CM

## 2015-10-30 MED FILL — CARTIA XT 240 MG CAPSULE: 240 | 30 days supply | Qty: 30 | Fill #0

## 2015-10-30 NOTE — Telephone Encounter (Signed)
Requesting Cardizem to be filled @ [facility].

## 2015-10-30 NOTE — Telephone Encounter (Signed)
Please send RX to [facility] outpatient pharmacy to fill at $4.00

## 2015-10-31 NOTE — Telephone Encounter (Signed)
Patient called again in regards to the diltiazem rx. He was not aware that it was sent to the [facility] outpatient pharmacy yesterday by another provider. He will contact the pharmacy to be sure that they received the rx and have it ready for him.

## 2015-10-31 NOTE — Telephone Encounter (Signed)
F/u       *STAT* If patient is at the pharmacy, call can be transferred to refill team.   1. Which medications need to be refilled? (please list name of each medication and dose if known) Diltiazem 240 mg  2. Which pharmacy/location (including street and city if local pharmacy) is medication to be sent to? [facility]   3. Do they need a 30 day or 90 day supply? [place]

## 2015-11-01 ENCOUNTER — Other Ambulatory Visit: Payer: Self-pay | Admitting: *Deleted

## 2015-11-01 DIAGNOSIS — I10 Essential (primary) hypertension: Secondary | ICD-10-CM

## 2015-11-01 MED ORDER — FUROSEMIDE 40 MG PO TABS: 40.0000 mg | ORAL_TABLET | Freq: Two times a day (BID) | ORAL | 1 refills | Status: DC

## 2015-11-01 NOTE — Addendum Note (Signed)
Addended by: Forde Dandy on: 11/01/2015 03:25 PM   Modules accepted: Orders

## 2015-11-02 MED ORDER — RIBAVIRIN 200 MG PO CAPS: 200.0000 mg | ORAL_CAPSULE | Freq: Two times a day (BID) | ORAL | 3 refills | Status: DC

## 2015-11-02 MED ORDER — DILTIAZEM HCL ER COATED BEADS 240 MG PO CP24: 240.0000 mg | ORAL_CAPSULE | Freq: Every day | ORAL | 1 refills | Status: DC

## 2015-11-02 MED ORDER — RIVAROXABAN 20 MG PO TABS: 20.0000 mg | ORAL_TABLET | Freq: Every day | ORAL | 0 refills | Status: DC

## 2015-11-02 MED ORDER — ELBASVIR-GRAZOPREVIR 50-100 MG PO TABS: 1.0000 | ORAL_TABLET | Freq: Every day | ORAL | 1 refills | Status: DC

## 2015-11-09 ENCOUNTER — Ambulatory Visit: Payer: No Typology Code available for payment source | Admitting: Pharmacist

## 2015-11-09 ENCOUNTER — Ambulatory Visit (HOSPITAL_COMMUNITY)
Admission: RE | Admit: 2015-11-09 | Discharge: 2015-11-09 | Disposition: A | Discharge status: Home / Self Care | Payer: No Typology Code available for payment source | Source: Ambulatory Visit | Attending: Internal Medicine | Admitting: Internal Medicine

## 2015-11-09 ENCOUNTER — Encounter (HOSPITAL_COMMUNITY): Payer: Self-pay | Admitting: Nurse Practitioner

## 2015-11-09 ENCOUNTER — Ambulatory Visit (HOSPITAL_COMMUNITY)
Admission: RE | Admit: 2015-11-09 | Discharge: 2015-11-09 | Disposition: A | Discharge status: Home / Self Care | Payer: No Typology Code available for payment source | Source: Ambulatory Visit | Attending: Nurse Practitioner | Admitting: Nurse Practitioner

## 2015-11-09 VITALS — BP 174/92 | HR 79 | Ht 71.0 in | Wt 350.2 lb

## 2015-11-09 DIAGNOSIS — F17210 Nicotine dependence, cigarettes, uncomplicated: Secondary | ICD-10-CM | POA: Insufficient documentation

## 2015-11-09 DIAGNOSIS — R0683 Snoring: Secondary | ICD-10-CM | POA: Insufficient documentation

## 2015-11-09 DIAGNOSIS — I509 Heart failure, unspecified: Secondary | ICD-10-CM | POA: Insufficient documentation

## 2015-11-09 DIAGNOSIS — B1920 Unspecified viral hepatitis C without hepatic coma: Secondary | ICD-10-CM | POA: Insufficient documentation

## 2015-11-09 DIAGNOSIS — B182 Chronic viral hepatitis C: Secondary | ICD-10-CM

## 2015-11-09 DIAGNOSIS — K7689 Other specified diseases of liver: Secondary | ICD-10-CM | POA: Insufficient documentation

## 2015-11-09 DIAGNOSIS — Z8249 Family history of ischemic heart disease and other diseases of the circulatory system: Secondary | ICD-10-CM | POA: Insufficient documentation

## 2015-11-09 DIAGNOSIS — I4892 Unspecified atrial flutter: Secondary | ICD-10-CM | POA: Insufficient documentation

## 2015-11-09 DIAGNOSIS — Z7901 Long term (current) use of anticoagulants: Secondary | ICD-10-CM | POA: Insufficient documentation

## 2015-11-09 DIAGNOSIS — I110 Hypertensive heart disease with heart failure: Secondary | ICD-10-CM | POA: Insufficient documentation

## 2015-11-09 DIAGNOSIS — I4891 Unspecified atrial fibrillation: Secondary | ICD-10-CM

## 2015-11-09 DIAGNOSIS — Z833 Family history of diabetes mellitus: Secondary | ICD-10-CM | POA: Insufficient documentation

## 2015-11-09 DIAGNOSIS — Z6841 Body Mass Index (BMI) 40.0 and over, adult: Secondary | ICD-10-CM | POA: Insufficient documentation

## 2015-11-09 DIAGNOSIS — Z79899 Other long term (current) drug therapy: Secondary | ICD-10-CM | POA: Insufficient documentation

## 2015-11-09 LAB — CBC
HCT: 39.1 % (ref 38.5–50.0)
Hemoglobin: 13.6 g/dL (ref 13.2–17.1)
MCH: 32.1 pg (ref 27.0–33.0)
MCHC: 34.8 g/dL (ref 32.0–36.0)
MCV: 92.2 fL (ref 80.0–100.0)
MPV: 10.2 fL (ref 7.5–12.5)
PLATELETS: 129 10*3/uL — AB (ref 140–400)
RBC: 4.24 MIL/uL (ref 4.20–5.80)
RDW: 14.4 % (ref 11.0–15.0)
WBC: 4.8 10*3/uL (ref 3.8–10.8)

## 2015-11-09 MED ORDER — RIBAVIRIN 200 MG PO CAPS: 600.0000 mg | ORAL_CAPSULE | Freq: Two times a day (BID) | ORAL | 3 refills | Status: DC

## 2015-11-09 NOTE — Progress Notes (Signed)
HPI: Frank Morales is a 55 y.o. male who presents for HCV f/u with pharmacy.  Allergies: No Known Allergies  Vitals: BP: 174/92 (08/24 0826) Pulse Rate: 79 (08/24 0826)  Past Medical History: Past Medical History:  Diagnosis Date  . Abscess and cellulitis   . Smoker     Social History: Social History   Social History  . Marital status: Single    Spouse name: N/A  . Number of children: N/A  . Years of education: N/A   Occupational History  . unemployed     Previous Training and development officer    Social History Main Topics  . Smoking status: Current Every Day Smoker    Packs/day: 0.30    Types: Cigarettes  . Smokeless tobacco: Never Used     Comment: 3 -4 cigs/day.  . Alcohol use 3.0 oz/week    5 Standard drinks or equivalent per week     Comment: occ beer.  . Drug use: No  . Sexual activity: Not on file   Other Topics Concern  . Not on file   Social History Narrative  . No narrative on file    Labs: Hepatitis B Surface Ag (no units)  Date Value  08/17/2015 Negative   HCV Ab (s/co ratio)  Date Value  08/17/2015 >11.0 (H)    No results found for: HCVGENOTYPE, HEPCGENOTYPE  Hepatitis C RNA quantitative Latest Ref Rng & Units 08/19/2015  HCV Quantitative >50 IU/mL 508,000  HCV Quantitative Log >1.70 log10 IU/mL 5.706    AST (U/L)  Date Value  08/19/2015 73 (H)  08/17/2015 63 (H)   ALT (U/L)  Date Value  08/19/2015 70 (H)  08/17/2015 65 (H)   INR (no units)  Date Value  08/21/2015 1.12    CrCl: CrCl cannot be calculated (Patient's most recent lab result is older than the maximum 21 days allowed.).  Fibrosis Score: F3/F4 as assessed by elastography  Child-Pugh Score: A  Previous Treatment Regimen: Naive   Assessment: Patient has HCV genotype 1a with NS5a resistance. He has been on combination treatment of Zepatier and ribavirin for 12 days now; denies missing any doses. However, he brought in his ribavirin bottle that says to take 1 tablet (200 mg)  BID, which is how he has been taking it. According to his weight of > 75kg, he should be taking 3 tablets (600 mg) BID. Advised him to start taking the 3 tablet dose tonight and continue for the rest of the 16 week therapy. Patient had an ultrasound done this morning which showed some F3 and F4. Patient endorses fatigue but otherwise tolerating the medications well. Explained that he had resistant HCV at baseline and emphasized adherence in order to clear the infection. Patient verbalized understanding of his treatment plan.  Recommendations: -continue Zepatier once daily -increase ribavirin to 600 mg BID; will send in new Rx -labs today -second shot of hep B on 9/5 -f/u visit with pharmacy on 9/26 and monthly for CBC -f/u visit with Dr. Linus Morales on 12/14 at end of therapy  Gwenlyn Perking, PharmD PGY1 Pharmacy Resident Pager: [phone] 11/09/2015 1:52 PM

## 2015-11-09 NOTE — Progress Notes (Signed)
Patient ID: Frank Morales, male   DOB: 1960/05/16, 55 y.o.   MRN: [id number]     Primary Care Physician: Frank Nims, MD Referring Physician: Dr. Janace Morales is a 55 y.o. male with a h/o hospitalization 6/7, NICM, new dx HCV antibody positive AFIB/flutter, CHF, Tobacco abuse, HTN, presumed OSA, Morbid obesity, cocaine abuse thought to have contributed to cardiomyopathy, new dx of HCV  and is afib clinic for f/u. He is on xarelto for chadsvasc score of at least 2(HTN, Diastolic dysfunction). He gives a history of not feeling well x 6-9 months with fatigue, shortness of breath, PND, orthopnea, swelling, prior to hospitalization 09/22/15 . States no further cocaine use x one month, continues to smoke, about one cigarette a day.. Feels better than prior to admission but still short of breath with minimal activity.  He was seen in f/u with Dr. Harrington Morales and set up for cardioversion, pt did not convert.He continues on xarelto and understands stroke risk. Rate controlled. Echo showed left atrium of 49 mm. He is undergoing treatment for new HepC diagnosis.Currently denies any alcohol or illicit drugs. He has gained from 347 to 350 lbs. States that he ate well at a family reunio this weekend. Fluid status is stable. + for LLE better in am and worse in pm.  Today, he denies symptoms of palpitations, chest pain,orthopnea, PND, lower extremity edema, dizziness, presyncope, syncope, or neurologic sequela.+ for exertional dyspnea. The patient is tolerating medications without difficulties and is otherwise without complaint today.   Past Medical History:  Diagnosis Date  . Abscess and cellulitis   . Smoker    Past Surgical History:  Procedure Laterality Date  . CARDIAC CATHETERIZATION N/A 08/21/2015   Procedure: Right/Left Heart Cath and Coronary Angiography;  Surgeon: Frank Man, MD;  Location: [place] [facility];  Service: Cardiovascular;  Laterality: N/A;  . CARDIOVERSION N/A 09/22/2015   Procedure: CARDIOVERSION;  Surgeon: Frank Hector, MD;  Location: [facility] ENDOSCOPY;  Service: Cardiovascular;  Laterality: N/A;  . NO PAST SURGERIES      Current Outpatient Prescriptions  Medication Sig Dispense Refill  . diltiazem (CARTIA XT) 240 MG 24 hr capsule Take 1 capsule (240 mg total) by mouth daily. 90 capsule 1  . Elbasvir-Grazoprevir (ZEPATIER) 50-100 MG TABS Take 1 tablet by mouth daily. 90 tablet 1  . furosemide (LASIX) 40 MG tablet Take 1 tablet (40 mg total) by mouth 2 (two) times daily. 120 tablet 1  . ribavirin (REBETOL) 200 MG capsule Take 1 capsule (200 mg total) by mouth 2 (two) times daily. 168 capsule 3  . rivaroxaban (XARELTO) 20 MG TABS tablet Take 1 tablet (20 mg total) by mouth daily with supper. 90 tablet 0   No current facility-administered medications for this encounter.     No Known Allergies  Social History   Social History  . Marital status: Single    Spouse name: N/A  . Number of children: N/A  . Years of education: N/A   Occupational History  . unemployed     Previous Training and development officer    Social History Main Topics  . Smoking status: Current Every Day Smoker    Packs/day: 0.30    Types: Cigarettes  . Smokeless tobacco: Never Used     Comment: 3 -4 cigs/day.  . Alcohol use 3.0 oz/week    5 Standard drinks or equivalent per week     Comment: occ beer.  . Drug use: No  . Sexual activity: Not  on file   Other Topics Concern  . Not on file   Social History Narrative  . No narrative on file    Family History  Problem Relation Age of Onset  . Hypertension Mother   . Hypertension Maternal Grandfather   . Diabetes Mother   . Pneumonia Father     ROS- All systems are reviewed and negative except as per the HPI above  Physical Exam: Vitals:   11/09/15 0826  BP: (!) 174/92  Pulse: 79  Weight: (!) 350 lb 3.2 oz (158.8 kg)  Height: 5\' 11"  (1.803 m)    GEN- The patient is well appearing, alert and oriented x 3 today.   Head- normocephalic,  atraumatic Eyes-  Sclera clear, conjunctiva pink Ears- hearing intact Oropharynx- clear Neck- supple, no JVP Lymph- no cervical lymphadenopathy Lungs- Clear to ausculation bilaterally, normal work of breathing Heart- irregular rate and rhythm, no murmurs, rubs or gallops, PMI not laterally displaced GI- soft, NT, ND, + BS Extremities- no clubbing, cyanosis, or edema MS- no significant deformity or atrophy Skin- no rash or lesion Psych- euthymic mood, full affect Neuro- strength and sensation are intact  EKG-Afib at 76 bpm, qrs int 110 ms, qtc 433 ms Echo-  *Frank Morales*  [address], [place] [address]  [phone]  ------------------------------------------------------------------- Transthoracic Echocardiography  Patient: Frank, Morales MR #: [id number] Study Date: 08/18/2015 Gender: M Age: 44 Height: 180.3 cm Weight: 155.7 kg BSA: 2.87 m^2 Pt. Status: Room: 2W37C  ADMITTING Frank Morales ATTENDING Frank Morales ORDERING Frank Morales REFERRING Frank Morales, Frank Morales, Inpatient SONOGRAPHER Frank Morales, RDCS  cc:  ------------------------------------------------------------------- LV EF: 50% - 55%  ------------------------------------------------------------------- Indications: Atrial flutter 427.32.  ------------------------------------------------------------------- History: PMH: Chest pain. Dyspnea. PMH: Syncope. Risk factors: Current tobacco use.  ------------------------------------------------------------------- Study Conclusions  - Left ventricle: Difficult acoustical windows make assessment of  wall motion difficult even with definity. Study is suggestive of  apical  hypokinesis. The cavity size was normal. There was  moderate focal basal and mild concentric hypertrophy. Systolic  function was normal. The estimated ejection fraction was in the  range of 50% to 55%. Diffuse hypokinesis. The study was not  technically sufficient to allow evaluation of LV diastolic  dysfunction due to atrial fibrillation. - Aortic valve: Trileaflet; mildly thickened, mildly calcified  leaflets. - Mitral valve: There was trivial regurgitation. - Left atrium: The atrium was severely dilated. - Right atrium: The atrium was severely dilated. - Tricuspid valve: There was mild regurgitation. - Pulmonary arteries: PA peak pressure: 37 mm Hg (S).  Impressions:  - The right ventricular systolic pressure was increased consistent  with mild pulmonary hypertension.   08/21/15  Cath-Angiographically minimal coronary artery disease. Large draping vessels. Several very tortuous  Mildly elevated secondary pulmonary hypertension  Mildly elevated signal April medication for elevated LVEDP. Suggest more diastolic dysfunction  Assessment and Plan: 1. Symptomatic  Afib/flutter Failed cardioversion Think chance of restoring to SR is very low at this point due to severely  dilated left atrium and morbid obesity Well rate controlled with diltiazem 240 mg With elevated liver enzymes and new dx of hep c,treatment pending, this would not be the time to start AAD, esp amiodarone  2. Lifestyle issues Encouraged weight loss Weight management class highly recommended Encouraged increased activity, but states too short of breath to do much Smoking cessation encouraged Denies alcohol, caffeine use States no cocaine use, used 3 years in past  Snores, sleep study scheduled 8/28  3. HF Limit fluids Limit salt Diuretic Weigh daily  F/u in 6 months  Butch Penny C. Aldahir Litaker, Seeley Morales [address] [place], [place] [address] [phone]

## 2015-11-10 LAB — HEPATITIS C RNA QUANTITATIVE: HCV Quantitative Log: <1.18 {Log} (ref <1.18)

## 2015-11-13 ENCOUNTER — Ambulatory Visit (HOSPITAL_BASED_OUTPATIENT_CLINIC_OR_DEPARTMENT_OTHER): Payer: No Typology Code available for payment source | Attending: Nurse Practitioner | Admitting: Cardiology

## 2015-11-13 VITALS — Ht 71.0 in | Wt 350.0 lb

## 2015-11-13 DIAGNOSIS — I4891 Unspecified atrial fibrillation: Secondary | ICD-10-CM | POA: Insufficient documentation

## 2015-11-13 DIAGNOSIS — I4892 Unspecified atrial flutter: Secondary | ICD-10-CM | POA: Insufficient documentation

## 2015-11-13 DIAGNOSIS — R0683 Snoring: Secondary | ICD-10-CM | POA: Insufficient documentation

## 2015-11-13 DIAGNOSIS — I493 Ventricular premature depolarization: Secondary | ICD-10-CM | POA: Insufficient documentation

## 2015-11-13 DIAGNOSIS — G4733 Obstructive sleep apnea (adult) (pediatric): Secondary | ICD-10-CM | POA: Insufficient documentation

## 2015-11-21 ENCOUNTER — Ambulatory Visit (INDEPENDENT_AMBULATORY_CARE_PROVIDER_SITE_OTHER): Payer: No Typology Code available for payment source | Admitting: *Deleted

## 2015-11-21 ENCOUNTER — Other Ambulatory Visit: Payer: Self-pay | Admitting: Pharmacist

## 2015-11-21 ENCOUNTER — Encounter (HOSPITAL_BASED_OUTPATIENT_CLINIC_OR_DEPARTMENT_OTHER): Payer: Self-pay | Admitting: Internal Medicine

## 2015-11-21 ENCOUNTER — Telehealth: Payer: Self-pay | Admitting: Cardiology

## 2015-11-21 DIAGNOSIS — G4733 Obstructive sleep apnea (adult) (pediatric): Secondary | ICD-10-CM | POA: Insufficient documentation

## 2015-11-21 DIAGNOSIS — Z23 Encounter for immunization: Secondary | ICD-10-CM

## 2015-11-21 DIAGNOSIS — B182 Chronic viral hepatitis C: Secondary | ICD-10-CM

## 2015-11-21 HISTORY — DX: Obstructive sleep apnea (adult) (pediatric): G47.33

## 2015-11-21 MED ORDER — RIBAVIRIN 200 MG PO CAPS: 600.0000 mg | ORAL_CAPSULE | Freq: Two times a day (BID) | ORAL | 3 refills | Status: DC

## 2015-11-21 MED FILL — RIBAVIRIN 200 MG TABLET: 200 | 28 days supply | Qty: 168 | Fill #0

## 2015-11-21 NOTE — Procedures (Signed)
Patient Name: Frank Morales, Frank Morales Date: 11/13/2015 Gender: Male D.O.B: 25-Feb-1961 Age (years): 55 Referring Provider: Sherran Needs Height (inches): 89 Interpreting Physician: Fransico Him MD, ABSM Weight (lbs): 350 RPSGT: Madelon Lips BMI: 49 MRN: [id number] Neck Size: 19.50  CLINICAL INFORMATION Sleep Study Type: Split Night CPAP Indication for sleep study: N/A Epworth Sleepiness Score: 11  SLEEP STUDY TECHNIQUE As per the AASM Manual for the Scoring of Sleep and Associated Events v2.3 (April 2016) with a hypopnea requiring 4% desaturations. The channels recorded and monitored were frontal, central and occipital EEG, electrooculogram (EOG), submentalis EMG (chin), nasal and oral airflow, thoracic and abdominal wall motion, anterior tibialis EMG, snore microphone, electrocardiogram, and pulse oximetry. Continuous positive airway pressure (CPAP) was initiated when the patient met split night criteria and was titrated according to treat sleep-disordered breathing.  MEDICATIONS Medications taken by the patient : Reviewed in the chart Medications administered by patient during sleep study : No sleep medicine administered.  RESPIRATORY PARAMETERS Diagnostic Total AHI (/hr): 23.4  RDI (/hr):27.5   OA Index (/hr): -  CA Index (/hr): 0.0 REM AHI (/hr): 56.0  NREM AHI (/hr):21.7  Supine AHI (/hr):55.7  Non-supine AHI (/hr):17.96 Min O2 Sat (%):75.00  Mean O2 (%):91.72  Time below 88% (min):8.7      Titration Optimal Pressure (cm):N/A  AHI at Optimal Pressure (/hr):N/A  Min O2 at Optimal Pressure (%):N/A Supine % at Optimal (%):0 Sleep % at Optimal (%):N/A      SLEEP ARCHITECTURE The recording time for the entire night was 409.5 minutes. During a baseline period of 231.3 minutes, the patient slept for 148.5 minutes in REM and nonREM, yielding a sleep efficiency of 64.2%. Sleep onset after lights out was 6.1 minutes with a REM latency of 116.0 minutes. The patient spent  29.63% of the night in stage N1 sleep, 65.32% in stage N2 sleep, 0.00% in stage N3 and 5.05% in REM. During the titration period of 170.2 minutes, the patient slept for 121.5 minutes in REM and nonREM, yielding a sleep efficiency of 71.4%. Sleep onset after CPAP initiation was 9.2 minutes with a REM latency of 41.5 minutes. The patient spent 19.34% of the night in stage N1 sleep, 56.79% in stage N2 sleep, 0.00% in stage N3 and 23.87% in REM.  CARDIAC DATA The 2 lead EKG demonstrated atrial fibrillation. The mean heart rate was 69.36 beats per minute. Other EKG findings include: PVCs.  LEG MOVEMENT DATA The total Periodic Limb Movements of Sleep (PLMS) were 0. The PLMS index was 0.00 .  IMPRESSIONS - Moderate obstructive sleep apnea occurred during the diagnostic portion of the study(AHI = 23.4/hour). An optimal PAP pressure could not be achieved for this patient due to ongoing respiratory events. - No significant central sleep apnea occurred during the diagnostic portion of the study (CAI = 0.0/hour). - Moderate oxygen desaturation was noted during the diagnostic portion of the study (Min O2 =75.00%). - The patient snored with Loud snoring volume during the diagnostic portion of the study. - EKG findings include PVCs and atrial fibrillation. - Clinically significant periodic limb movements did not occur during sleep.  DIAGNOSIS - Obstructive Sleep Apnea (327.23 [G47.33 ICD-10]) - Atrial Fibrillation  RECOMMENDATIONS - As patient could not be adequately titrated on CPAP due to lack of time and continued respiratory events, recommend in lab CPAP titration with possible BIPAP. - Avoid alcohol, sedatives and other CNS depressants that may worsen sleep apnea and disrupt normal sleep architecture. - Sleep hygiene should be reviewed  to assess factors that may improve sleep quality.  [place], American Board of Sleep Medicine  ELECTRONICALLY SIGNED ON:  11/21/2015, 10:22 AM [place] PH: [phone]   FX: [phone] [place]

## 2015-11-21 NOTE — Progress Notes (Signed)
Prescription was not submitted (possible tech downtime),  Re-sent Rx to [facility] outpatient pharmacy

## 2015-11-21 NOTE — Telephone Encounter (Signed)
Please let patient know that they have significant sleep apnea but unsuccessful CPAP titration.  Please set up in lab CPAP titration

## 2015-11-21 NOTE — Telephone Encounter (Signed)
Please set pt up for in lab CPAP titration    See note by Ashok Norris

## 2015-11-22 NOTE — Telephone Encounter (Signed)
Attempted to call patient.  Someone answered the phone and said that he was not available to call back tomorrow.   I will attempt to call when I am in the office tomorrow.

## 2015-11-24 ENCOUNTER — Encounter: Payer: Self-pay | Admitting: Internal Medicine

## 2015-11-28 ENCOUNTER — Other Ambulatory Visit: Payer: Self-pay | Admitting: *Deleted

## 2015-11-28 DIAGNOSIS — G4733 Obstructive sleep apnea (adult) (pediatric): Secondary | ICD-10-CM

## 2015-11-28 NOTE — Telephone Encounter (Signed)
Spoke with patient regarding sleep study results.   He is aware that he has sleep apnea, and needs to return to the lab for titration.   I have explained to him the benefits of CPAP/BiPAP Therapy, and he is okay to proceed with titration.  Will schedule it with the sleep lab, and he will get a letter in the mail with the date.  He is aware of this, and he was also told if he had any questions to call me back and I will help him the best I can.   He appreciated the call and said he would look for the letter

## 2015-12-01 ENCOUNTER — Encounter: Payer: Self-pay | Admitting: *Deleted

## 2015-12-08 ENCOUNTER — Encounter (INDEPENDENT_AMBULATORY_CARE_PROVIDER_SITE_OTHER): Payer: Self-pay

## 2015-12-08 ENCOUNTER — Encounter: Payer: Self-pay | Admitting: Internal Medicine

## 2015-12-08 ENCOUNTER — Ambulatory Visit (INDEPENDENT_AMBULATORY_CARE_PROVIDER_SITE_OTHER): Payer: No Typology Code available for payment source | Admitting: Internal Medicine

## 2015-12-08 VITALS — BP 130/82 | HR 77 | Ht 71.0 in | Wt 343.4 lb

## 2015-12-08 DIAGNOSIS — I10 Essential (primary) hypertension: Secondary | ICD-10-CM

## 2015-12-08 DIAGNOSIS — R601 Generalized edema: Secondary | ICD-10-CM

## 2015-12-08 LAB — BASIC METABOLIC PANEL
BUN: 17 mg/dL (ref 7–25)
CALCIUM: 9.1 mg/dL (ref 8.6–10.3)
CHLORIDE: 101 mmol/L (ref 98–110)
CO2: 26 mmol/L (ref 20–31)
CREATININE: 0.82 mg/dL (ref 0.70–1.33)
GLUCOSE: 100 mg/dL — AB (ref 65–99)
Potassium: 4.3 mmol/L (ref 3.5–5.3)
Sodium: 136 mmol/L (ref 135–146)

## 2015-12-08 LAB — CBC
HEMATOCRIT: 39.8 % (ref 38.5–50.0)
Hemoglobin: 13.6 g/dL (ref 13.2–17.1)
MCH: 32.3 pg (ref 27.0–33.0)
MCHC: 34.2 g/dL (ref 32.0–36.0)
MCV: 94.5 fL (ref 80.0–100.0)
MPV: 9.9 fL (ref 7.5–12.5)
PLATELETS: 173 10*3/uL (ref 140–400)
RBC: 4.21 MIL/uL (ref 4.20–5.80)
RDW: 15.4 % — AB (ref 11.0–15.0)
WBC: 5.4 10*3/uL (ref 3.8–10.8)

## 2015-12-08 LAB — BRAIN NATRIURETIC PEPTIDE: Brain Natriuretic Peptide: 80.2 pg/mL (ref <100)

## 2015-12-08 MED ORDER — FUROSEMIDE 40 MG PO TABS: 60.0000 mg | ORAL_TABLET | Freq: Every day | ORAL | 6 refills | Status: DC

## 2015-12-08 MED FILL — FUROSEMIDE 40 MG TABLET: 40 | 30 days supply | Qty: 45 | Fill #0

## 2015-12-08 NOTE — Patient Instructions (Signed)
Medication Instructions:  Your physician has recommended you make the following change in your medication:  1) Change Lasix to 60 mg (1 and 1/2 tablets) once a day  Labwork: TODAY: BMET/CBC/BNP  Testing/Procedures: None Ordered   Follow-Up: Your physician recommends that you schedule a follow-up appointment in: 3 weeks with Dr. Harrington Challenger or APP on a day Dr. Harrington Challenger is in clinic.   Any Other Special Instructions Will Be Listed Below (If Applicable).     If you need a refill on your cardiac medications before your next appointment, please call your pharmacy.

## 2015-12-08 NOTE — Progress Notes (Signed)
Cardiology Office Note   Date:  12/08/2015   ID:  DONTRAY PEELE, DOB 1960/12/12, MRN [id number]  PCP:  Dellia Nims, MD  Cardiologist:   Dorris Carnes, MD    Pt presents for f/u of NICM  And atrial fib     History of Present Illness: Frank Morales is a 55 y.o. male with a history of NICM and atrial fib  Failed [place] for ate contol    Has problems with LE edema    Breathing is short when he goes up hills   Denies CP  No dizziness     Outpatient Medications Prior to Visit  Medication Sig Dispense Refill  . diltiazem (CARTIA XT) 240 MG 24 hr capsule Take 1 capsule (240 mg total) by mouth daily. 90 capsule 1  . Elbasvir-Grazoprevir (ZEPATIER) 50-100 MG TABS Take 1 tablet by mouth daily. 90 tablet 1  . furosemide (LASIX) 40 MG tablet Take 1 tablet (40 mg total) by mouth 2 (two) times daily. 120 tablet 1  . ribavirin (REBETOL) 200 MG capsule Take 3 capsules (600 mg total) by mouth 2 (two) times daily. 168 capsule 3  . rivaroxaban (XARELTO) 20 MG TABS tablet Take 1 tablet (20 mg total) by mouth daily with supper. 90 tablet 0   No facility-administered medications prior to visit.      Allergies:   Review of patient's allergies indicates no known allergies.   Past Medical History:  Diagnosis Date  . Abscess and cellulitis   . OSA (obstructive sleep apnea) 11/21/2015  . Smoker     Past Surgical History:  Procedure Laterality Date  . CARDIAC CATHETERIZATION N/A 08/21/2015   Procedure: Right/Left Heart Cath and Coronary Angiography;  Surgeon: Leonie Man, MD;  Location: [place] [facility];  Service: Cardiovascular;  Laterality: N/A;  . CARDIOVERSION N/A 09/22/2015   Procedure: CARDIOVERSION;  Surgeon: Josue Hector, MD;  Location: [facility] ENDOSCOPY;  Service: Cardiovascular;  Laterality: N/A;  . NO PAST SURGERIES       Social History:  The patient  reports that he has been smoking Cigarettes.  He has been smoking about 0.30 packs per day. He has never used  smokeless tobacco. He reports that he drinks about 3.0 oz of alcohol per week . He reports that he does not use drugs.   Family History:  The patient's family history includes Diabetes in his mother; Hypertension in his maternal grandfather and mother; Pneumonia in his father.    ROS:  Please see the history of present illness. All other systems are reviewed and  Negative to the above problem except as noted.    PHYSICAL EXAM: VS:  BP 130/82   Pulse 77   Ht 5\' 11"  (1.803 m)   Wt (!) 343 lb 6.4 oz (155.8 kg)   SpO2 97%   BMI 47.89 kg/m   GEN: Morbidly obese 55yo in no acute distress HEENT: normal Neck: no JVD, carotid bruits, or masses Cardiac: RRR; no murmurs, rubs, or gallops,1-2+ edema  Respiratory:  clear to auscultation bilaterally, normal work of breathing GI: soft, nontender, nondistended, + BS  No hepatomegaly  MS: no deformity Moving all extremities   Skin: warm and dry, no rash Neuro:  Strength and sensation are intact Psych: euthymic mood, full affect   EKG:  EKG is not ordered today.   Lipid Panel    Component Value Date/Time   CHOL 105 08/17/2015 1233   TRIG 182 (H) 08/17/2015 1233  HDL 28 (L) 08/17/2015 1233   CHOLHDL 3.8 08/17/2015 1233   VLDL 36 08/17/2015 1233   LDLCALC 41 08/17/2015 1233      Wt Readings from Last 3 Encounters:  12/08/15 (!) 343 lb 6.4 oz (155.8 kg)  11/13/15 (!) 350 lb (158.8 kg)  11/09/15 (!) 350 lb 3.2 oz (158.8 kg)      ASSESSMENT AND PLAN:  1  Atrial fib  Continue on rate control and anticoaguation  2  Diastolic CHF  VOlume status appears up on exam   We discussed salt  And fluid restriction   I would recomm checking BMEt today  I would also recomm switch lasix to 60 mg 1x per day   Follow output  INstructed on weighing every am  3  Sleep apnea Encouraged him to go for titration of CPAP  Would help overall condition   Will get CBC and BNP    F/U in 3 wks        Current medicines are reviewed at length with the  patient today.  The patient does not have concerns regarding medicines.  Signed, Dorris Carnes, MD  12/08/2015 9:12 AM    [facility] [place], [place],   [address] Phone: [phone]; Fax: [phone]

## 2015-12-11 ENCOUNTER — Telehealth: Payer: Self-pay | Admitting: *Deleted

## 2015-12-11 MED ORDER — FLUTICASONE PROPIONATE 50 MCG/ACT NA SUSP: 1.0000 | Freq: Every day | NASAL | 2 refills | Status: DC

## 2015-12-11 NOTE — Telephone Encounter (Signed)
Pt advised of lab results by phone with verbal understanding. Pt said Dr. Harrington Challenger was going to send in some sinus medications. He asked if this had been sent in yet. I advised pt that I will send a message to Dr. Alan Ripper nurse Ander Slade who can check on this for him and call him back about this. Pt said ok and thank you.

## 2015-12-11 NOTE — Addendum Note (Signed)
Addended by: Rodman Key on: 12/11/2015 02:00 PM   Modules accepted: Orders

## 2015-12-11 NOTE — Telephone Encounter (Signed)
Reviewed with Dr Harrington Challenger, sent prescription for [facility] to pharmacy.  Called patient and informed.

## 2015-12-12 ENCOUNTER — Ambulatory Visit: Payer: No Typology Code available for payment source

## 2015-12-12 DIAGNOSIS — B182 Chronic viral hepatitis C: Secondary | ICD-10-CM

## 2015-12-12 MED FILL — RIBAVIRIN 200 MG TABLET: 200 | 28 days supply | Qty: 168 | Fill #1

## 2015-12-12 MED FILL — FLUTICASONE PROP 50 MCG SPR: 50 | 60 days supply | Qty: 16 | Fill #0

## 2015-12-12 NOTE — Progress Notes (Signed)
HPI: Frank Morales is a 55 y.o. male who presents for Hepatitis C follow-up.  No results found for: HCVGENOTYPE, HEPCGENOTYPE  Allergies: No Known Allergies  Vitals:    Past Medical History: Past Medical History:  Diagnosis Date  . Abscess and cellulitis   . OSA (obstructive sleep apnea) 11/21/2015  . Smoker     Social History: Social History   Social History  . Marital status: Single    Spouse name: N/A  . Number of children: N/A  . Years of education: N/A   Occupational History  . unemployed     Previous Training and development officer    Social History Main Topics  . Smoking status: Current Every Day Smoker    Packs/day: 0.30    Types: Cigarettes  . Smokeless tobacco: Never Used     Comment: 3 -4 cigs/day.  . Alcohol use 3.0 oz/week    5 Standard drinks or equivalent per week     Comment: occ beer.  . Drug use: No  . Sexual activity: Not on file   Other Topics Concern  . Not on file   Social History Narrative  . No narrative on file    Labs: Hepatitis B Surface Ag (no units)  Date Value  08/17/2015 Negative   HCV Ab (s/co ratio)  Date Value  08/17/2015 >11.0 (H)    No results found for: HCVGENOTYPE, HEPCGENOTYPE  Hepatitis C RNA quantitative Latest Ref Rng & Units 11/09/2015 08/19/2015  HCV Quantitative <15 IU/mL <15 508,000  HCV Quantitative Log <1.18 log 10 <1.18 5.706    AST (U/L)  Date Value  08/19/2015 73 (H)  08/17/2015 63 (H)   ALT (U/L)  Date Value  08/19/2015 70 (H)  08/17/2015 65 (H)   INR (no units)  Date Value  08/21/2015 1.12    CrCl: Estimated Creatinine Clearance: 156.6 mL/min (by C-G formula based on SCr of 0.82 mg/dL).  Fibrosis Score: F3/F4 as assessed by elastography   Child-Pugh Score: Class A  Previous Treatment Regimen: None  Assessment: Frank Morales is doing well with his Hepatitis C infection. He has been taking his Zepatier daily and correctly increased his ribavirin to 600 mg (3 tablets) BID. He reports he has not missed  any doses and has not noticed any new adverse effects. He reports the only new medication he is planning to start is Flonase from his PCP.  He brought his medications with him today, he has plenty of ribavirin and about 10 days left of Zepatier. Frank Morales has called in refills for the patient and they will be shipped to his home. He had a CBC done on 9/22 and showed a stable hemoglobin of 13.6, we will continue his ribavirin at 600 mg BID.  Recommendations: - Continue Zepatier 1 tablet daily - Continue ribavirin 600 mg (3 tablets) BID - Return in 1 month to check CBC - Appointment scheduled with [name] at completion of therapy on 02/29/2016  Dimitri Ped, PharmD. PGY-2 Pharmacy Resident Pager: [phone] 12/12/2015, 10:03 AM

## 2015-12-18 ENCOUNTER — Encounter: Payer: No Typology Code available for payment source | Admitting: Internal Medicine

## 2016-01-01 ENCOUNTER — Encounter: Payer: Self-pay | Admitting: Physician Assistant

## 2016-01-01 ENCOUNTER — Ambulatory Visit (INDEPENDENT_AMBULATORY_CARE_PROVIDER_SITE_OTHER): Payer: No Typology Code available for payment source | Admitting: Physician Assistant

## 2016-01-01 VITALS — BP 130/80 | HR 107 | Ht 71.0 in | Wt 355.8 lb

## 2016-01-01 DIAGNOSIS — Z7901 Long term (current) use of anticoagulants: Secondary | ICD-10-CM

## 2016-01-01 DIAGNOSIS — I10 Essential (primary) hypertension: Secondary | ICD-10-CM

## 2016-01-01 DIAGNOSIS — I482 Chronic atrial fibrillation, unspecified: Secondary | ICD-10-CM

## 2016-01-01 DIAGNOSIS — G4733 Obstructive sleep apnea (adult) (pediatric): Secondary | ICD-10-CM

## 2016-01-01 DIAGNOSIS — I5033 Acute on chronic diastolic (congestive) heart failure: Secondary | ICD-10-CM

## 2016-01-01 MED ORDER — POTASSIUM CHLORIDE ER 10 MEQ PO TBCR: 10.0000 meq | EXTENDED_RELEASE_TABLET | Freq: Two times a day (BID) | ORAL | 3 refills | Status: DC

## 2016-01-01 MED ORDER — FUROSEMIDE 40 MG PO TABS: 60.0000 mg | ORAL_TABLET | Freq: Two times a day (BID) | ORAL | 3 refills | Status: DC

## 2016-01-01 MED FILL — KLOR-CON M10 TABLET: 10 | 90 days supply | Qty: 180 | Fill #0

## 2016-01-01 MED FILL — FUROSEMIDE 40 MG TABLET: 40 | 30 days supply | Qty: 90 | Fill #0

## 2016-01-01 NOTE — Patient Instructions (Addendum)
Medication Instructions:  1. INCREASE LASIX TO 60 MG TWICE DAILY 2. START POTASSIUM 10 MEQ 1 TABLET TWICE DAILY  Labwork: BMET TO BE DONE IN 1 WEEK 01/08/16   Testing/Procedures: NONE  Follow-Up: 01/16/16 @ 9:45 WITH SCOTT WEAVER, PAC   Any Other Special Instructions Will Be Listed Below (If Applicable).  If you need a refill on your cardiac medications before your next appointment, please call your pharmacy.

## 2016-01-01 NOTE — Progress Notes (Signed)
Cardiology Office Note:    Date:  01/01/2016   ID:  Frank Morales, DOB 1960-12-30, MRN [id number]  PCP:  Frank Nims, MD  Cardiologist:  Dr. Dorris Carnes   Electrophysiologist:  n/a  Referring MD: Frank Nims, MD   Chief Complaint  Patient presents with  . Follow-up    CHF    History of Present Illness:    Frank Morales is a 55 y.o. male with a hx of chronic AF/flutter, diastolic CHF, OSA, obesity, substance abuse, HCV. He was admitted in 6/17 with newly diagnosed CHF and atrial flutter. Cardiac catheterization that time demonstrated no significant CAD. Echocardiogram demonstrated EF 50-55%.  He is on Xarelto for anticoagulation.  He underwent DCCV in 7/17 but reverted back to NSR.  He was seen by Frank Palau, NP in the AFib clinic and rate control strategy was felt to be the most appropriate at that point.  Last seen by Dr. Harrington Morales 12/08/15. He was volume overloaded and his Lasix was adjusted. He returns for follow-up.    He is here alone today. He notes dyspnea with minimal activities. He sleeps on 1-2 pillows. He does note occasional PND. LE edema is unchanged. He denies syncope. He has occasional chest discomfort. He denies any bleeding issues.   Prior CV studies that were reviewed today include:    LHC 08/21/15 LM normal LAD irregularities LCx normal RCA normal Angiographically minimal coronary artery disease. Large draping vessels. Several very tortuous Mildly elevated secondary pulmonary hypertension  Echo 08/18/15 Apical HK, moderate focal basal and mild concentric LVH, EF 50-55, diffuse HK, trivial MR, severe BAE, mild TR, PASP 37   Past Medical History:  Diagnosis Date  . Atrial fibrillation and flutter ([place]) 08/17/2015   A. S/p failed DCCV // b. Severe BAE on echo >> rate control strategy (has seen AF clinic) // c. Xarelto for anticoag (CHADS2-VASc=2 / CHF, HTN)  . Chronic diastolic CHF (congestive heart failure) ([place]) 08/30/2015   A. Echo 6/17: Apical HK,  moderate focal basal and mild concentric LVH, EF 50-55, diffuse HK, trivial MR, severe BAE, mild TR, PASP 37  . Hepatitis C, chronic ([place]) 08/19/2015  . History of cardiac catheterization    a. LHC 6/17: LAD irregs, o/w no CAD  . OSA (obstructive sleep apnea) 11/21/2015  . Tobacco abuse     Past Surgical History:  Procedure Laterality Date  . CARDIAC CATHETERIZATION N/A 08/21/2015   Procedure: Right/Left Heart Cath and Coronary Angiography;  Surgeon: Frank Man, MD;  Location: [facility];  Service: Cardiovascular;  Laterality: N/A;  . CARDIOVERSION N/A 09/22/2015   Procedure: CARDIOVERSION;  Surgeon: Frank Hector, MD;  Location: [facility] ENDOSCOPY;  Service: Cardiovascular;  Laterality: N/A;  . NO PAST SURGERIES      Current Medications: Current Meds  Medication Sig  . diltiazem (CARTIA XT) 240 MG 24 hr capsule Take 1 capsule (240 mg total) by mouth daily.  . Elbasvir-Grazoprevir (ZEPATIER) 50-100 MG TABS Take 1 tablet by mouth daily.  . fluticasone (FLONASE) 50 MCG/ACT nasal spray Place 1 spray into both nostrils daily.  . furosemide (LASIX) 40 MG tablet Take 1.5 tablets (60 mg total) by mouth 2 (two) times daily.  . ribavirin (REBETOL) 200 MG capsule Take 3 capsules (600 mg total) by mouth 2 (two) times daily.  . rivaroxaban (XARELTO) 20 MG TABS tablet Take 1 tablet (20 mg total) by mouth daily with supper.  . [DISCONTINUED] furosemide (LASIX) 40 MG tablet Take 1.5 tablets (60  mg total) by mouth daily.     Allergies:   Review of patient's allergies indicates no known allergies.   Social History   Social History  . Marital status: Single    Spouse name: N/A  . Number of children: N/A  . Years of education: N/A   Occupational History  . unemployed     Previous Training and development officer    Social History Main Topics  . Smoking status: Current Every Day Smoker    Packs/day: 0.30    Types: Cigarettes  . Smokeless tobacco: Never Used     Comment: 3 -4 cigs/day.  . Alcohol use 3.0 oz/week     5 Standard drinks or equivalent per week     Comment: occ beer.  . Drug use: No  . Sexual activity: Not Asked   Other Topics Concern  . None   Social History Narrative  . None     Family History:  The patient's family history includes Diabetes in his mother; Hypertension in his maternal grandfather and mother; Pneumonia in his father.   ROS:   Please see the history of present illness.    Review of Systems  Cardiovascular: Positive for dyspnea on exertion, irregular heartbeat and leg swelling.  Respiratory: Positive for cough, shortness of breath and wheezing.   Musculoskeletal: Positive for back pain, joint pain and myalgias.   All other systems reviewed and are negative.   EKGs/Labs/Other Test Reviewed:    EKG:  EKG is  ordered today.  The ekg ordered today demonstrates Atrial fibrillation, HR 107  Recent Labs: 08/17/2015: TSH 1.713 08/19/2015: ALT 70 08/21/2015: Magnesium 2.1 12/08/2015: Brain Natriuretic Peptide 80.2; BUN 17; Creat 0.82; Hemoglobin 13.6; Platelets 173; Potassium 4.3; Sodium 136   Recent Lipid Panel    Component Value Date/Time   CHOL 105 08/17/2015 1233   TRIG 182 (H) 08/17/2015 1233   HDL 28 (L) 08/17/2015 1233   CHOLHDL 3.8 08/17/2015 1233   VLDL 36 08/17/2015 1233   LDLCALC 41 08/17/2015 1233     Physical Exam:    VS:  BP 130/80   Pulse (!) 107   Ht 5\' 11"  (1.803 m)   Wt (!) 355 lb 12.8 oz (161.4 kg)   SpO2 98%   BMI 49.62 kg/m     Wt Readings from Last 3 Encounters:  01/01/16 (!) 355 lb 12.8 oz (161.4 kg)  12/08/15 (!) 343 lb 6.4 oz (155.8 kg)  11/13/15 (!) 350 lb (158.8 kg)     Physical Exam  Constitutional: He is oriented to person, place, and time. He appears well-developed and well-nourished. No distress.  HENT:  Head: Normocephalic and atraumatic.  Eyes: No scleral icterus.  Neck:  I cannot appreciate JVD  Cardiovascular: Normal rate, S1 normal and S2 normal.  An irregularly irregular rhythm present.  No murmur  heard. Pulmonary/Chest: He has decreased breath sounds. He has no wheezes. He has no rales.  Abdominal: Soft. There is no tenderness.  Musculoskeletal: He exhibits edema.  Brawny 1-2+ bilateral LE edema  Neurological: He is alert and oriented to person, place, and time.  Skin: Skin is warm and dry.  Psychiatric: He has a normal mood and affect.    ASSESSMENT:    1. Acute on chronic diastolic CHF (congestive heart failure) ([place])   2. Chronic atrial fibrillation (HCC)   3. Essential hypertension   4. OSA (obstructive sleep apnea)   5. On continuous oral anticoagulation    PLAN:    In order of problems listed  above:  1. A/C diastolic CHF - He remains significantly volume overloaded.  His weights at DC in 6/17 was 330 lbs at home.  Recently, he has been 345 lbs at home.  This weight is unchanged.  He is NYHA 3.    -  Increase Lasix to 60 mg bid  -  K+ 10 mEq bid  -  BMET 1 week  -  FU 2 weeks  -  Limit salt - we discussed this today.  2. Chronic AFib/Flutter - His HR was initially elevated.  It has been well controlled.  After resting for a while, I palpated his pulse and it was in the 80s. Will continue current dose of calcium channel blocker.  Consider increasing dose at FU if still elevated.  3. HTN - BP controlled.   4. OSA - Continue CPAP.   5. Anticoagulation - He remains on Xarelto. Hgb and Creatinine in 9/17 was ok.   Medication Adjustments/Labs and Tests Ordered: Current medicines are reviewed at length with the patient today.  Concerns regarding medicines are outlined above.  Medication changes, Labs and Tests ordered today are outlined in the Patient Instructions noted below. Patient Instructions  Medication Instructions:  1. INCREASE LASIX TO 60 MG TWICE DAILY 2. START POTASSIUM 10 MEQ 1 TABLET TWICE DAILY  Labwork: BMET TO BE DONE IN 1 WEEK 01/08/16   Testing/Procedures: NONE  Follow-Up: 01/16/16 @ 9:45 WITH Nuriyah Hanline, PAC   Any Other Special  Instructions Will Be Listed Below (If Applicable).  If you need a refill on your cardiac medications before your next appointment, please call your pharmacy.  Signed, Richardson Dopp, PA-C  01/01/2016 12:32 PM    [facility] [place], [place], [place]  [address] Phone: [phone]; Fax: [phone]

## 2016-01-05 ENCOUNTER — Telehealth: Payer: Self-pay | Admitting: Internal Medicine

## 2016-01-05 NOTE — Telephone Encounter (Signed)
Apt. Reminder call, phone off

## 2016-01-08 ENCOUNTER — Other Ambulatory Visit: Payer: No Typology Code available for payment source | Admitting: *Deleted

## 2016-01-08 ENCOUNTER — Ambulatory Visit: Payer: No Typology Code available for payment source | Admitting: Pharmacist

## 2016-01-08 ENCOUNTER — Other Ambulatory Visit: Payer: Self-pay | Admitting: Pharmacist

## 2016-01-08 ENCOUNTER — Encounter: Payer: Self-pay | Admitting: Internal Medicine

## 2016-01-08 ENCOUNTER — Ambulatory Visit: Payer: No Typology Code available for payment source | Admitting: Internal Medicine

## 2016-01-08 DIAGNOSIS — Z79899 Other long term (current) drug therapy: Secondary | ICD-10-CM

## 2016-01-08 DIAGNOSIS — I4892 Unspecified atrial flutter: Principal | ICD-10-CM

## 2016-01-08 DIAGNOSIS — I10 Essential (primary) hypertension: Secondary | ICD-10-CM

## 2016-01-08 DIAGNOSIS — I4891 Unspecified atrial fibrillation: Secondary | ICD-10-CM

## 2016-01-08 LAB — BASIC METABOLIC PANEL
BUN: 15 mg/dL (ref 7–25)
CO2: 26 mmol/L (ref 20–31)
Calcium: 8.9 mg/dL (ref 8.6–10.3)
Chloride: 101 mmol/L (ref 98–110)
Creat: 0.86 mg/dL (ref 0.70–1.33)
Glucose, Bld: 85 mg/dL (ref 65–99)
POTASSIUM: 4 mmol/L (ref 3.5–5.3)
SODIUM: 138 mmol/L (ref 135–146)

## 2016-01-08 MED ORDER — RIVAROXABAN 20 MG PO TABS: 20.0000 mg | ORAL_TABLET | Freq: Every day | ORAL | 0 refills | Status: DC

## 2016-01-08 MED FILL — RIBAVIRIN 200 MG TABLET: 200 | 28 days supply | Qty: 168 | Fill #2

## 2016-01-08 NOTE — Progress Notes (Signed)
S: Frank Morales is a 55 y.o. male reports to clinical pharmacist appointment for medication review. Patient did  bring medication bottles.  No Known Allergies Medication Sig  diltiazem (CARTIA XT) 240 MG 24 hr capsule Take 1 capsule (240 mg total) by mouth daily.  Elbasvir-Grazoprevir (ZEPATIER) 50-100 MG TABS Take 1 tablet by mouth daily.  fluticasone (FLONASE) 50 MCG/ACT nasal spray Place 1 spray into both nostrils daily.  furosemide (LASIX) 40 MG tablet Take 1.5 tablets (60 mg total) by mouth 2 (two) times daily.  potassium chloride (K-DUR) 10 MEQ tablet Take 1 tablet (10 mEq total) by mouth 2 (two) times daily.  ribavirin (REBETOL) 200 MG capsule Take 3 capsules (600 mg total) by mouth 2 (two) times daily.  rivaroxaban (XARELTO) 20 MG TABS tablet Take 1 tablet (20 mg total) by mouth daily with supper.   Past Medical History:  Diagnosis Date  . Atrial fibrillation and flutter ([place]) 08/17/2015   A. S/p failed DCCV // b. Severe BAE on echo >> rate control strategy (has seen AF clinic) // c. Xarelto for anticoag (CHADS2-VASc=2 / CHF, HTN)  . Chronic diastolic CHF (congestive heart failure) ([place]) 08/30/2015   A. Echo 6/17: Apical HK, moderate focal basal and mild concentric LVH, EF 50-55, diffuse HK, trivial MR, severe BAE, mild TR, PASP 37  . Hepatitis C, chronic ([place]) 08/19/2015  . History of cardiac catheterization    a. LHC 6/17: LAD irregs, o/w no CAD  . OSA (obstructive sleep apnea) 11/21/2015  . Tobacco abuse    Social History   Social History  . Marital status: Single    Spouse name: N/A  . Number of children: N/A  . Years of education: N/A   Occupational History  . unemployed     Previous Training and development officer    Social History Main Topics  . Smoking status: Current Every Day Smoker    Packs/day: 0.30    Types: Cigarettes  . Smokeless tobacco: Never Used     Comment: 3 -4 cigs/day.  . Alcohol use 3.0 oz/week    5 Standard drinks or equivalent per week     Comment: occ beer.  . Drug  use: No  . Sexual activity: Not on file   Other Topics Concern  . Not on file   Social History Narrative  . No narrative on file   Family History  Problem Relation Age of Onset  . Hypertension Mother   . Diabetes Mother   . Pneumonia Father   . Hypertension Maternal Grandfather    O: Component Value Date/Time   CHOL 105 08/17/2015 1233   HDL 28 (L) 08/17/2015 1233   TRIG 182 (H) 08/17/2015 1233   AST 73 (H) 08/19/2015 0301   ALT 70 (H) 08/19/2015 0301   NA 136 12/08/2015 0948   K 4.3 12/08/2015 0948   CL 101 12/08/2015 0948   CO2 26 12/08/2015 0948   GLUCOSE 100 (H) 12/08/2015 0948   HGBA1C 5.4 08/17/2015 0954   BUN 17 12/08/2015 0948   CREATININE 0.82 12/08/2015 0948   CALCIUM 9.1 12/08/2015 0948   GFRAA >60 08/22/2015 0525   WBC 5.4 12/08/2015 0948   HGB 13.6 12/08/2015 0948   HCT 39.8 12/08/2015 0948   PLT 173 12/08/2015 0948   TSH 1.713 08/17/2015 0935   Ht Readings from Last 2 Encounters:  01/08/16 5\' 11"  (1.803 m)  01/01/16 5\' 11"  (1.803 m)   Wt Readings from Last 2 Encounters:  01/08/16 (!) 344 lb 4.8 oz (  156.2 kg)  01/01/16 (!) 355 lb 12.8 oz (161.4 kg)   There is no height or weight on file to calculate BMI. BP Readings from Last 3 Encounters:  01/08/16 122/72  01/01/16 130/80  12/08/15 130/82   A/P:  Reviewed medications with patient  Needs a refill on rivaroxaban, refill request sent  Needs help understanding how to obtain refills on other medications. Patient utilizes patient assistance programs for most of his medications so wrote down numbers to call for refills on each.  He states he is doing well overall, CHF symptoms improving, mild shortness of breath, swelling has gone down, had to reschedule PCP appointment  Advised patient to follow up if further questions or concerns arise.   The patient verbalized understanding of information provided by repeating back concepts discussed.   10 minutes spent face-to-face with the patient during  the encounter. 50% of time spent on education. 50% of time was spent on medication review.

## 2016-01-09 ENCOUNTER — Telehealth: Payer: Self-pay | Admitting: *Deleted

## 2016-01-09 NOTE — Telephone Encounter (Signed)
Tried to call pt to go over lab results, line busy.

## 2016-01-10 NOTE — Telephone Encounter (Signed)
Pt notified of lab results by phone with verbal understanding. Pt states they had phone line problems yesterday which is why we could not get through.

## 2016-01-11 ENCOUNTER — Ambulatory Visit (HOSPITAL_BASED_OUTPATIENT_CLINIC_OR_DEPARTMENT_OTHER): Payer: No Typology Code available for payment source | Attending: Cardiology | Admitting: Cardiology

## 2016-01-11 ENCOUNTER — Ambulatory Visit: Payer: No Typology Code available for payment source | Admitting: Pharmacist

## 2016-01-11 DIAGNOSIS — I4892 Unspecified atrial flutter: Secondary | ICD-10-CM

## 2016-01-11 DIAGNOSIS — I10 Essential (primary) hypertension: Secondary | ICD-10-CM

## 2016-01-11 DIAGNOSIS — I4891 Unspecified atrial fibrillation: Secondary | ICD-10-CM | POA: Insufficient documentation

## 2016-01-11 DIAGNOSIS — G4733 Obstructive sleep apnea (adult) (pediatric): Secondary | ICD-10-CM | POA: Insufficient documentation

## 2016-01-11 DIAGNOSIS — B182 Chronic viral hepatitis C: Secondary | ICD-10-CM

## 2016-01-11 LAB — CBC WITH DIFFERENTIAL/PLATELET
BASOS ABS: 43 {cells}/uL (ref 0–200)
BASOS PCT: 1 %
EOS ABS: 86 {cells}/uL (ref 15–500)
Eosinophils Relative: 2 %
HCT: 40 % (ref 38.5–50.0)
Hemoglobin: 13.2 g/dL (ref 13.2–17.1)
LYMPHS PCT: 46 %
Lymphs Abs: 1978 cells/uL (ref 850–3900)
MCH: 32.3 pg (ref 27.0–33.0)
MCHC: 33 g/dL (ref 32.0–36.0)
MCV: 97.8 fL (ref 80.0–100.0)
MONOS PCT: 10 %
MPV: 9.9 fL (ref 7.5–12.5)
Monocytes Absolute: 430 cells/uL (ref 200–950)
NEUTROS PCT: 41 %
Neutro Abs: 1763 cells/uL (ref 1500–7800)
PLATELETS: 163 10*3/uL (ref 140–400)
RBC: 4.09 MIL/uL — ABNORMAL LOW (ref 4.20–5.80)
RDW: 14.6 % (ref 11.0–15.0)
WBC: 4.3 10*3/uL (ref 3.8–10.8)

## 2016-01-11 MED ORDER — DILTIAZEM HCL ER COATED BEADS 240 MG PO CP24: 240.0000 mg | ORAL_CAPSULE | Freq: Every day | ORAL | 2 refills | Status: DC

## 2016-01-11 MED ORDER — RIVAROXABAN 20 MG PO TABS: 20.0000 mg | ORAL_TABLET | Freq: Every day | ORAL | 2 refills | Status: DC

## 2016-01-11 NOTE — Progress Notes (Signed)
HPI: Frank Morales is a 55 y.o. male who presents to the [facility] clinic today for follow-up of his Hep C. He started Zepatier + RBV on 10/30/15. He just started his 3rd month.   No results found for: HCVGENOTYPE, HEPCGENOTYPE  Allergies: No Known Allergies  Past Medical History: Past Medical History:  Diagnosis Date  . Atrial fibrillation and flutter ([place]) 08/17/2015   A. S/p failed DCCV // b. Severe BAE on echo >> rate control strategy (has seen AF clinic) // c. Xarelto for anticoag (CHADS2-VASc=2 / CHF, HTN)  . Chronic diastolic CHF (congestive heart failure) ([place]) 08/30/2015   A. Echo 6/17: Apical HK, moderate focal basal and mild concentric LVH, EF 50-55, diffuse HK, trivial MR, severe BAE, mild TR, PASP 37  . Hepatitis C, chronic ([place]) 08/19/2015  . History of cardiac catheterization    a. LHC 6/17: LAD irregs, o/w no CAD  . OSA (obstructive sleep apnea) 11/21/2015  . Tobacco abuse     Social History: Social History   Social History  . Marital status: Single    Spouse name: N/A  . Number of children: N/A  . Years of education: N/A   Occupational History  . unemployed     Previous Training and development officer    Social History Main Topics  . Smoking status: Current Every Day Smoker    Packs/day: 0.30    Types: Cigarettes  . Smokeless tobacco: Never Used     Comment: 3 -4 cigs/day.  . Alcohol use 3.0 oz/week    5 Standard drinks or equivalent per week     Comment: occ beer.  . Drug use: No  . Sexual activity: Not on file   Other Topics Concern  . Not on file   Social History Narrative  . No narrative on file    Labs: Hepatitis B Surface Ag (no units)  Date Value  08/17/2015 Negative   HCV Ab (s/co ratio)  Date Value  08/17/2015 >11.0 (H)    No results found for: HCVGENOTYPE, HEPCGENOTYPE  Hepatitis C RNA quantitative Latest Ref Rng & Units 11/09/2015 08/19/2015  HCV Quantitative <15 IU/mL <15 508,000  HCV Quantitative Log <1.18 log 10 <1.18 5.706    AST (U/L)  Date  Value  08/19/2015 73 (H)  08/17/2015 63 (H)   ALT (U/L)  Date Value  08/19/2015 70 (H)  08/17/2015 65 (H)   INR (no units)  Date Value  08/21/2015 1.12    CrCl: Estimated Creatinine Clearance: 149.6 mL/min (by C-G formula based on SCr of 0.86 mg/dL).  Fibrosis Score: F3/F4 as assessed by elastography   Child-Pugh Score: A  Previous Treatment Regimen: None  Assessment: Frank Morales comes in today for follow-up of his Hep C. He has genotype 1a with NS5a resistance, so he is on 16 weeks of Zepatier and RBV.  He is correctly taking all of his medications - 3 capsules of RBV twice a day and one tablet of Zepatier daily.  He is running out of diltiazem and xarelto and needs refills.  I don't want him to run out of his Xarelto (he has afib) so I refilled both of those medications for him.  He sees his cardiologist soon.  He is not having any issues with his Hep C medications and is very compliant.  We will get a CBC today and see him one more time next month before he sees Dr. Linus Morales at [facility] in December.  His hemoglobin has been stable all throughout therapy.   Plans: -  Continue Zepatier + RBV x 2 more months - CBC today - F/u with [facility] pharmacist 11/30 at 9:30am - F/u with Dr. Linus Morales 12/14 at [facility]. Frank Morales, PharmD Infectious Diseases [facility] 01/11/2016, 4:20 PM

## 2016-01-12 NOTE — Procedures (Signed)
   Patient Name: Frank Morales, Frank Morales Date: 01/11/2016 Gender: Male D.O.B: [birth year] Age (years): 55 Referring Provider: Sherran Needs Height (inches): 86 Interpreting Physician: Fransico Him MD, ABSM Weight (lbs): 350 RPSGT: Zadie Rhine BMI: 49 MRN: [id number] Neck Size: 19.50  CLINICAL INFORMATION The patient is referred for a CPAP titration to treat sleep apnea. Date of NPSG, Split Night or HST:11/13/2015  SLEEP STUDY TECHNIQUE As per the AASM Manual for the Scoring of Sleep and Associated Events v2.3 (April 2016) with a hypopnea requiring 4% desaturations. The channels recorded and monitored were frontal, central and occipital EEG, electrooculogram (EOG), submentalis EMG (chin), nasal and oral airflow, thoracic and abdominal wall motion, anterior tibialis EMG, snore microphone, electrocardiogram, and pulse oximetry. Continuous positive airway pressure (CPAP) was initiated at the beginning of the study and titrated to treat sleep-disordered breathing.  MEDICATIONS Medications self-administered by patient taken the night of the study : N/A  TECHNICIAN COMMENTS Comments added by technician: PT WENT TO RESTROOM TWICE  Comments added by scorer: N/A  RESPIRATORY PARAMETERS Optimal PAP Pressure (cm): 20  AHI at Optimal Pressure (/hr):0.0 Overall Minimal O2 (%):85.00  Supine % at Optimal Pressure (%):0 Minimal O2 at Optimal Pressure (%): 91.0    SLEEP ARCHITECTURE The study was initiated at 10:06:53 PM and ended at 4:27:35 AM. Sleep onset time was 17.6 minutes and the sleep efficiency was 61.7%. The total sleep time was 235.0 minutes. The patient spent 6.17% of the night in stage N1 sleep, 59.79% in stage N2 sleep, 0.00% in stage N3 and 34.04% in REM.Stage REM latency was 36.0 minutes Wake after sleep onset was 128.1. Alpha intrusion was absent. Supine sleep was 46.51%.  CARDIAC DATA The 2 lead EKG demonstrated atrial fibrillation. The mean heart rate was 61.58 beats  per minute.   LEG MOVEMENT DATA The total Periodic Limb Movements of Sleep (PLMS) were 0. The PLMS index was 0.00. A PLMS index of <15 is considered normal in adults.  IMPRESSIONS - The optimal PAP pressure was 20 cm of water. - Central sleep apnea was not noted during this titration (CAI = 0.0/h). - Moderate oxygen desaturations were observed during this titration (min O2 = 85.00%). - No snoring was audible during this study. - 2-lead EKG demonstrated: Atrial Fibrillation - Clinically significant periodic limb movements were not noted during this study. Arousals associated with PLMs were rare.  DIAGNOSIS - Obstructive Sleep Apnea (327.23 [G47.33 ICD-10]) - Atrial Fibrillation  RECOMMENDATIONS - Trial of CPAP therapy on 20 cm H2O with a Medium size Resmed Full Face Mask AirFit F20 mask and heated humidification. - Avoid alcohol, sedatives and other CNS depressants that may worsen sleep apnea and disrupt normal sleep architecture. - Sleep hygiene should be reviewed to assess factors that may improve sleep quality. - Weight management and regular exercise should be initiated or continued. - Return to Sleep Center for re-evaluation after 10 weeks of therapy   [place], [place] [facility]  ELECTRONICALLY SIGNED ON:  01/12/2016, 3:15 PM [place] PH: [phone]   FX: [phone] [place]

## 2016-01-12 NOTE — Progress Notes (Signed)
Attempted to call patient. No answer no voicemail.

## 2016-01-12 NOTE — Progress Notes (Signed)
Entered encounter in error

## 2016-01-15 NOTE — Progress Notes (Deleted)
Cardiology Office Note:    Date:  01/15/2016   ID:  Frank Morales, DOB 05-23-1960, MRN [id number]  PCP:  Frank Nims, MD  Cardiologist:  Dr. Dorris Carnes   Electrophysiologist:  n/a  Referring MD: Frank Nims, MD   No chief complaint on file. ***  History of Present Illness:    Frank Morales is a 55 y.o. male with a hx of chronic AF/flutter, diastolic CHF, OSA, obesity, substance abuse, HCV. He was admitted in 6/17 with newly diagnosed CHF and atrial flutter. Cardiac catheterization that time demonstrated no significant CAD. Echocardiogram demonstrated EF 50-55%.  He is on Xarelto for anticoagulation.  He underwent DCCV in 7/17 but reverted back to NSR.  He was seen by Roderic Palau, NP in the AFib clinic and rate control strategy was felt to be the most appropriate at that point.   I saw him in FU 01/01/16 for CHF.  He was significantly volume overloaded and I increased his Lasix.  He returns for FU.  ***  Prior CV studies that were reviewed today include:    LHC 08/21/15 LM normal LAD irregularities LCx normal RCA normal Angiographically minimal coronary artery disease. Large draping vessels. Several very tortuous Mildly elevated secondary pulmonary hypertension  Echo 08/18/15 Apical HK, moderate focal basal and mild concentric LVH, EF 50-55, diffuse HK, trivial MR, severe BAE, mild TR, PASP 37  Past Medical History:  Diagnosis Date  . Atrial fibrillation and flutter ([place]) 08/17/2015   A. S/p failed DCCV // b. Severe BAE on echo >> rate control strategy (has seen AF clinic) // c. Xarelto for anticoag (CHADS2-VASc=2 / CHF, HTN)  . Chronic diastolic CHF (congestive heart failure) ([place]) 08/30/2015   A. Echo 6/17: Apical HK, moderate focal basal and mild concentric LVH, EF 50-55, diffuse HK, trivial MR, severe BAE, mild TR, PASP 37  . Hepatitis C, chronic ([place]) 08/19/2015  . History of cardiac catheterization    a. LHC 6/17: LAD irregs, o/w no CAD  . OSA (obstructive sleep  apnea) 11/21/2015  . Tobacco abuse     Past Surgical History:  Procedure Laterality Date  . CARDIAC CATHETERIZATION N/A 08/21/2015   Procedure: Right/Left Heart Cath and Coronary Angiography;  Surgeon: Leonie Man, MD;  Location: [place] [facility];  Service: Cardiovascular;  Laterality: N/A;  . CARDIOVERSION N/A 09/22/2015   Procedure: CARDIOVERSION;  Surgeon: Josue Hector, MD;  Location: [facility] ENDOSCOPY;  Service: Cardiovascular;  Laterality: N/A;  . NO PAST SURGERIES      Current Medications: No outpatient prescriptions have been marked as taking for the 01/16/16 encounter (Appointment) with Liliane Shi, PA-C.     Allergies:   Review of patient's allergies indicates no known allergies.   Social History   Social History  . Marital status: Single    Spouse name: N/A  . Number of children: N/A  . Years of education: N/A   Occupational History  . unemployed     Previous Training and development officer    Social History Main Topics  . Smoking status: Current Every Day Smoker    Packs/day: 0.30    Types: Cigarettes  . Smokeless tobacco: Never Used     Comment: 3 -4 cigs/day.  . Alcohol use 3.0 oz/week    5 Standard drinks or equivalent per week     Comment: occ beer.  . Drug use: No  . Sexual activity: Not on file   Other Topics Concern  . Not on file   Social History  Narrative  . No narrative on file     Family History:  The patient's ***family history includes Diabetes in his mother; Hypertension in his maternal grandfather and mother; Pneumonia in his father.   ROS:   Please see the history of present illness.    ROS All other systems reviewed and are negative.   EKGs/Labs/Other Test Reviewed:    EKG:  EKG is *** ordered today.  The ekg ordered today demonstrates ***  Recent Labs: 08/17/2015: TSH 1.713 08/19/2015: ALT 70 08/21/2015: Magnesium 2.1 12/08/2015: Brain Natriuretic Peptide 80.2 01/08/2016: BUN 15; Creat 0.86; Potassium 4.0; Sodium 138 01/11/2016: Hemoglobin 13.2; Platelets  163   Recent Lipid Panel    Component Value Date/Time   CHOL 105 08/17/2015 1233   TRIG 182 (H) 08/17/2015 1233   HDL 28 (L) 08/17/2015 1233   CHOLHDL 3.8 08/17/2015 1233   VLDL 36 08/17/2015 1233   LDLCALC 41 08/17/2015 1233     Physical Exam:    VS:  There were no vitals taken for this visit.    Wt Readings from Last 3 Encounters:  01/11/16 (!) 350 lb (158.8 kg)  01/08/16 (!) 344 lb 4.8 oz (156.2 kg)  01/01/16 (!) 355 lb 12.8 oz (161.4 kg)     ***Physical Exam  ASSESSMENT:    No diagnosis found. PLAN:    In order of problems listed above:  1. ***diastolic CHF - ***He is NYHA 3.   2. Chronic AFib/Flutter - *** 3. HTN - *** 4. OSA - Continue CPAP.  5. Anticoagulation - He remains on Xarelto. ***Hgb was 13.2 and Creatinine was 0.86 on 01/08/16.  Medication Adjustments/Labs and Tests Ordered: Current medicines are reviewed at length with the patient today.  Concerns regarding medicines are outlined above.  Medication changes, Labs and Tests ordered today are outlined in the Patient Instructions noted below. There are no Patient Instructions on file for this visit. Signed, Richardson Dopp, PA-C  01/15/2016 9:14 PM    [facility] [place], [place], [place]  [address] Phone: [phone]; Fax: [phone]

## 2016-01-16 ENCOUNTER — Ambulatory Visit: Payer: No Typology Code available for payment source | Admitting: Physician Assistant

## 2016-01-16 NOTE — Progress Notes (Signed)
01/16/16  Left message for patient to call back

## 2016-01-17 NOTE — Progress Notes (Signed)
Patient called back verbalized understanding waiting to hear back about his c-pap machine he has the mask

## 2016-01-22 ENCOUNTER — Telehealth: Payer: Self-pay | Admitting: *Deleted

## 2016-01-22 NOTE — Telephone Encounter (Signed)
Patient does not want to set up at this time because he can't afford the fee or the suppliles

## 2016-02-02 MED FILL — RIBAVIRIN 200 MG TABLET: 200 | 28 days supply | Qty: 168 | Fill #3

## 2016-02-12 ENCOUNTER — Encounter: Payer: No Typology Code available for payment source | Admitting: Internal Medicine

## 2016-02-14 NOTE — Progress Notes (Signed)
Cardiology Office Note    Date:  02/15/2016   ID:  Frank Morales, DOB [birth year], MRN [id number]  PCP:  Dellia Nims, MD  Cardiologist:  Dr. Harrington Challenger  Chief Complaint: Two-week follow-up for CHF  History of Present Illness:   Frank Morales is a 55 y.o. male with a hx of chronic AF/flutter, diastolic CHF, OSA working to get CPAP, obesity, substance abuse, HCV who presented for follow up.   He was admitted in 6/17 with newly diagnosed CHF and atrial flutter. Cardiac catheterization that time demonstrated no significant CAD. Echocardiogram demonstrated EF 50-55%.  He is on Xarelto for anticoagulation.  He underwent DCCV in 7/17 but reverted back to NSR.  He was seen by Roderic Palau, NP in the AFib clinic and rate control strategy was felt to be the most appropriate at that point.    Last seen by Dr. Harrington Challenger 12/08/15. He was volume overloaded and his Lasix was adjusted.   Seen by Richardson Dopp 01/01/2016 for follow up. He continues to have dyspnea. His Lasix was increased and advised to cut back on salt.  Seen by Dr. Radford Pax 01/11/16 for sleep apnea and placed on trial of CPAP therapy however insurance denies. He is working on this.   Here today for follow-up. Continue to have dyspnea with minimal activity with LE edema. Admits to having occasional PND. This has been stable. No syncope or chest pain. Denies melena or blood in his stool or urine. Has poison IV 2 weeks ago. Used OTC steroids scream, improved and now has scattered back rash. Complaint with diet and fluid intake,. He has not been checking his weight. Weight of 351lb today, it was 355lb during last OV.    Past Medical History:  Diagnosis Date  . Atrial fibrillation and flutter ([place]) 08/17/2015   A. S/p failed DCCV // b. Severe BAE on echo >> rate control strategy (has seen AF clinic) // c. Xarelto for anticoag (CHADS2-VASc=2 / CHF, HTN)  . Chronic diastolic CHF (congestive heart failure) ([place]) 08/30/2015   A. Echo 6/17: Apical  HK, moderate focal basal and mild concentric LVH, EF 50-55, diffuse HK, trivial MR, severe BAE, mild TR, PASP 37  . Hepatitis C, chronic ([place]) 08/19/2015  . History of cardiac catheterization    a. LHC 6/17: LAD irregs, o/w no CAD  . OSA (obstructive sleep apnea) 11/21/2015  . Tobacco abuse     Past Surgical History:  Procedure Laterality Date  . CARDIAC CATHETERIZATION N/A 08/21/2015   Procedure: Right/Left Heart Cath and Coronary Angiography;  Surgeon: Leonie Man, MD;  Location: [place] [facility];  Service: Cardiovascular;  Laterality: N/A;  . CARDIOVERSION N/A 09/22/2015   Procedure: CARDIOVERSION;  Surgeon: Josue Hector, MD;  Location: [facility] ENDOSCOPY;  Service: Cardiovascular;  Laterality: N/A;  . NO PAST SURGERIES      Current Medications: Prior to Admission medications   Medication Sig Start Date End Date Taking? Authorizing Provider  diltiazem (CARTIA XT) 240 MG 24 hr capsule Take 1 capsule (240 mg total) by mouth daily. 01/11/16   Thayer Headings, MD  Elbasvir-Grazoprevir (ZEPATIER) 50-100 MG TABS Take 1 tablet by mouth daily. 11/02/15   Tasrif Ahmed, MD  fluticasone (FLONASE) 50 MCG/ACT nasal spray Place 1 spray into both nostrils daily. 12/11/15   Fay Records, MD  furosemide (LASIX) 40 MG tablet Take 1.5 tablets (60 mg total) by mouth 2 (two) times daily. 01/01/16   Liliane Shi, PA-C  potassium chloride (K-DUR)  10 MEQ tablet Take 1 tablet (10 mEq total) by mouth 2 (two) times daily. 01/01/16 03/31/16  Liliane Shi, PA-C  ribavirin (REBETOL) 200 MG capsule Take 3 capsules (600 mg total) by mouth 2 (two) times daily. 11/21/15   Dellia Nims, MD  rivaroxaban (XARELTO) 20 MG TABS tablet Take 1 tablet (20 mg total) by mouth daily with supper. 01/11/16   Thayer Headings, MD    Allergies:   Patient has no known allergies.   Social History   Social History  . Marital status: Single    Spouse name: N/A  . Number of children: N/A  . Years of education: N/A   Occupational History    . unemployed     Previous Training and development officer    Social History Main Topics  . Smoking status: Current Every Day Smoker    Packs/day: 0.30    Types: Cigarettes  . Smokeless tobacco: Never Used     Comment: 3 -4 cigs/day.  . Alcohol use 3.0 oz/week    5 Standard drinks or equivalent per week     Comment: occ beer.  . Drug use: No  . Sexual activity: Not Asked   Other Topics Concern  . None   Social History Narrative  . None     Family History:  The patient's family history includes Diabetes in his mother; Hypertension in his maternal grandfather and mother; Pneumonia in his father.   ROS:   Please see the history of present illness.    ROS All other systems reviewed and are negative.   PHYSICAL EXAM:   VS:  BP 130/90   Pulse (!) 56   Ht 5\' 11"  (1.803 m)   Wt (!) 351 lb 12.8 oz (159.6 kg)   SpO2 97%   BMI 49.07 kg/m    GEN: morbidly obese male in no acute distress  HEENT: normal  Neck: no JVD, carotid bruits, or masses Cardiac: IR IR ; no murmurs, rubs, or gallops, 2-3 + BL edema upto knee Respiratory:  clear to auscultation bilaterally, normal work of breathing GI: soft, nontender, distended, + BS MS: no deformity or atrophy  Skin: warm and dry, no rash Neuro:  Alert and Oriented x 3, Strength and sensation are intact Psych: euthymic mood, full affect  Wt Readings from Last 3 Encounters:  02/15/16 (!) 351 lb 12.8 oz (159.6 kg)  01/11/16 (!) 350 lb (158.8 kg)  01/08/16 (!) 344 lb 4.8 oz (156.2 kg)      Studies/Labs Reviewed:   EKG:  EKG is not ordered today.    Recent Labs: 08/17/2015: TSH 1.713 08/19/2015: ALT 70 08/21/2015: Magnesium 2.1 12/08/2015: Brain Natriuretic Peptide 80.2 01/08/2016: BUN 15; Creat 0.86; Potassium 4.0; Sodium 138 01/11/2016: Hemoglobin 13.2; Platelets 163   Lipid Panel    Component Value Date/Time   CHOL 105 08/17/2015 1233   TRIG 182 (H) 08/17/2015 1233   HDL 28 (L) 08/17/2015 1233   CHOLHDL 3.8 08/17/2015 1233   VLDL 36 08/17/2015 1233    LDLCALC 41 08/17/2015 1233    Additional studies/ records that were reviewed today include:   Echocardiogram: 08/18/15 LV EF: 50% -   55%  ------------------------------------------------------------------- Indications:      Atrial flutter 427.32.  ------------------------------------------------------------------- History:   PMH:   Chest pain.  Dyspnea.  PMH:  Syncope.  Risk factors:  Current tobacco use.  ------------------------------------------------------------------- Study Conclusions  - Left ventricle: Difficult acoustical windows make assessment of   wall motion difficult even with definity. Study is  suggestive of   apical hypokinesis. The cavity size was normal. There was   moderate focal basal and mild concentric hypertrophy. Systolic   function was normal. The estimated ejection fraction was in the   range of 50% to 55%. Diffuse hypokinesis. The study was not   technically sufficient to allow evaluation of LV diastolic   dysfunction due to atrial fibrillation. - Aortic valve: Trileaflet; mildly thickened, mildly calcified   leaflets. - Mitral valve: There was trivial regurgitation. - Left atrium: The atrium was severely dilated. - Right atrium: The atrium was severely dilated. - Tricuspid valve: There was mild regurgitation. - Pulmonary arteries: PA peak pressure: 37 mm Hg (S).  Impressions:  - The right ventricular systolic pressure was increased consistent   with mild pulmonary hypertension.   Cardiac Catheterization: 08/21/15 Right/Left Heart Cath and Coronary Angiography  Conclusion    Angiographically minimal coronary artery disease. Large draping vessels. Several very tortuous  Mildly elevated secondary pulmonary hypertension   Mildly elevated signal April medication for elevated LVEDP. Suggest more diastolic dysfunction       ASSESSMENT & PLAN:   1. Dyspnea on exertion with LE edema - This has been stable. Wight down 4lb since last  office visit. Occasional PND. He only able to walk or stand for about 10 minutes before he gets dyspneic and need to rest. Seen this been due to multiple factors: Morbid obesity, ongoing tobacco smoking, chronic diastolic CHF, chronic afib and untreated OSA.  - Advised to loose weight. Try to get CPAP. Trial of compression stocking. Smoking cessation. Patient BNP was normal few months ago and his symptoms has worsen or improved since then.  - Will sent message to Dr. Harrington Challenger for further recommendations. ? Trial of short term higher dose of lasix or torsemide. Trial of ACE/ARB with diastolic CHF.   2. Chronic diastolic CHF - AS above  3. Permanent A. fib/A flutter -Rate stable. Continue anticoagulation with Xarelto. No bleeding complication.  4. Hypertension - BP stable.   5. OSA  - trying to get approval for CPAP  6. Morbid obesity - encouraged weight loss and active lifestyle.  7. Tobacco smoking - >30 pack year hx. Currently smoking 1 pack/week. Advised complete cessation.   8. Rash - F/u with PCP.     Medication Adjustments/Labs and Tests Ordered: Current medicines are reviewed at length with the patient today.  Concerns regarding medicines are outlined above.  Medication changes, Labs and Tests ordered today are listed in the Patient Instructions below. Patient Instructions  Your physician recommends that you continue on your current medications as directed. Please refer to the Current Medication list given to you today.     Your physician recommends that you schedule a follow-up appointment in: 2 MONTHS  WITH  DR  Quintella Baton, [place], [place]  02/15/2016 10:28 AM    [facility], [place], [place]  [address] Phone: [phone]; Fax: [phone]

## 2016-02-15 ENCOUNTER — Ambulatory Visit (INDEPENDENT_AMBULATORY_CARE_PROVIDER_SITE_OTHER)
Payer: No Typology Code available for payment source | Admitting: Pharmacist Clinician (PhC)/ Clinical Pharmacy Specialist

## 2016-02-15 ENCOUNTER — Telehealth: Payer: Self-pay | Admitting: *Deleted

## 2016-02-15 ENCOUNTER — Encounter: Payer: Self-pay | Admitting: Physician Assistant

## 2016-02-15 ENCOUNTER — Ambulatory Visit (INDEPENDENT_AMBULATORY_CARE_PROVIDER_SITE_OTHER): Payer: No Typology Code available for payment source | Admitting: Physician Assistant

## 2016-02-15 VITALS — BP 130/90 | HR 56 | Ht 71.0 in | Wt 351.8 lb

## 2016-02-15 DIAGNOSIS — I482 Chronic atrial fibrillation, unspecified: Secondary | ICD-10-CM

## 2016-02-15 DIAGNOSIS — I10 Essential (primary) hypertension: Secondary | ICD-10-CM

## 2016-02-15 DIAGNOSIS — B182 Chronic viral hepatitis C: Secondary | ICD-10-CM

## 2016-02-15 DIAGNOSIS — R0609 Other forms of dyspnea: Secondary | ICD-10-CM

## 2016-02-15 DIAGNOSIS — I5032 Chronic diastolic (congestive) heart failure: Secondary | ICD-10-CM

## 2016-02-15 DIAGNOSIS — Z720 Tobacco use: Secondary | ICD-10-CM

## 2016-02-15 DIAGNOSIS — G4733 Obstructive sleep apnea (adult) (pediatric): Secondary | ICD-10-CM

## 2016-02-15 DIAGNOSIS — R21 Rash and other nonspecific skin eruption: Secondary | ICD-10-CM

## 2016-02-15 LAB — CBC
HEMATOCRIT: 44.6 % (ref 38.5–50.0)
Hemoglobin: 13.9 g/dL (ref 13.2–17.1)
MCH: 31.8 pg (ref 27.0–33.0)
MCHC: 31.2 g/dL — ABNORMAL LOW (ref 32.0–36.0)
MCV: 102.1 fL — AB (ref 80.0–100.0)
MPV: 10.1 fL (ref 7.5–12.5)
Platelets: 162 10*3/uL (ref 140–400)
RBC: 4.37 MIL/uL (ref 4.20–5.80)
RDW: 14.4 % (ref 11.0–15.0)
WBC: 4 10*3/uL (ref 3.8–10.8)

## 2016-02-15 MED ORDER — FUROSEMIDE 40 MG PO TABS: 80.0000 mg | ORAL_TABLET | Freq: Two times a day (BID) | ORAL | 3 refills | Status: DC

## 2016-02-15 NOTE — Progress Notes (Signed)
HPI: Frank Morales is a 55 y.o. male who is here for his hep C appt with pharmacy.  No results found for: HCVGENOTYPE, HEPCGENOTYPE  Allergies: No Known Allergies  Vitals:    Past Medical History: Past Medical History:  Diagnosis Date  . Atrial fibrillation and flutter ([place]) 08/17/2015   A. S/p failed DCCV // b. Severe BAE on echo >> rate control strategy (has seen AF clinic) // c. Xarelto for anticoag (CHADS2-VASc=2 / CHF, HTN)  . Chronic diastolic CHF (congestive heart failure) ([place]) 08/30/2015   A. Echo 6/17: Apical HK, moderate focal basal and mild concentric LVH, EF 50-55, diffuse HK, trivial MR, severe BAE, mild TR, PASP 37  . Hepatitis C, chronic ([place]) 08/19/2015  . History of cardiac catheterization    a. LHC 6/17: LAD irregs, o/w no CAD  . OSA (obstructive sleep apnea) 11/21/2015  . Tobacco abuse     Social History: Social History   Social History  . Marital status: Single    Spouse name: N/A  . Number of children: N/A  . Years of education: N/A   Occupational History  . unemployed     Previous Training and development officer    Social History Main Topics  . Smoking status: Current Every Day Smoker    Packs/day: 0.30    Types: Cigarettes  . Smokeless tobacco: Never Used     Comment: 3 -4 cigs/day.  . Alcohol use 3.0 oz/week    5 Standard drinks or equivalent per week     Comment: occ beer.  . Drug use: No  . Sexual activity: Not on file   Other Topics Concern  . Not on file   Social History Narrative  . No narrative on file    Labs: Hepatitis B Surface Ag (no units)  Date Value  08/17/2015 Negative   HCV Ab (s/co ratio)  Date Value  08/17/2015 >11.0 (H)    No results found for: HCVGENOTYPE, HEPCGENOTYPE  Hepatitis C RNA quantitative Latest Ref Rng & Units 11/09/2015 08/19/2015  HCV Quantitative <15 IU/mL <15 508,000  HCV Quantitative Log <1.18 log 10 <1.18 5.706    AST (U/L)  Date Value  08/19/2015 73 (H)  08/17/2015 63 (H)   ALT (U/L)  Date Value  08/19/2015  70 (H)  08/17/2015 65 (H)   INR (no units)  Date Value  08/21/2015 1.12    CrCl: CrCl cannot be calculated (Patient's most recent lab result is older than the maximum 21 days allowed.).  Fibrosis Score: F3/4 as assessed by [facility]FI  Child-Pugh Score: Class A  Previous Treatment Regimen: Naive  Assessment: Frank Morales is about 1 week out to complete 16 wks of zepatier and riba. He has not missed doses of his meds. The last time we checked his VL it was <15. Since he was undetectable at that point, we are going to one today to count as his end of treatment VL and a CBC since he is on ribavirin. He is going to f/u with [name] on 12/14. He has not had any side effects.   Recommendations:  Hep C VL today as EOT  CBC F/u with Dr Linus Salmons on 12/14  Koleson Reifsteck [place], [place].D., BCPS, AAHIVP Clinical Infectious [facility] 02/15/2016, 9:28 AM

## 2016-02-15 NOTE — Progress Notes (Signed)
I would increase lasix to 80 bid I would also set pt up for 24 hour holter to look at HR control BMET in 10 days

## 2016-02-15 NOTE — Patient Instructions (Addendum)
Continue your Zepatier and ribavirin to complete 4 full months Follow up with [name] on 12/14 We are going to get hep C VL and CBC today

## 2016-02-15 NOTE — Telephone Encounter (Signed)
-----   Message from Fay Records, MD sent at 02/15/2016 12:08 PM EST -----   ----- Message ----- From: Leanor Kail, PA Sent: 02/15/2016  10:40 AM To: Fay Records, MD  Seen your patient. Ongoing dyspnea without improvement. Please advise.

## 2016-02-15 NOTE — Telephone Encounter (Signed)
Fay Records, MD at 02/15/2016 10:00 AM   Status: Signed    I would increase lasix to 80 bid I would also set pt up for 24 hour holter to look at HR control BMET in 10 days       Above message received from Dr. Harrington Challenger. I called the patient.  He will begin taking Lasix 80 mg BID. I will send a new prescription for this to [facility] outpatient pharmacy.  On 12/11, he will come for lab work, I will ask [facility] to schedule the holter for same day if possible.  Pt asked about his orange card paying for compression stockings, I did not think orange card covers this, but advised that until he can purchase them, or until Medicaid is approved he could use ace wraps on his legs.

## 2016-02-15 NOTE — Patient Instructions (Addendum)
Your physician recommends that you continue on your current medications as directed. Please refer to the Current Medication list given to you today.     Your physician recommends that you schedule a follow-up appointment in: [place]

## 2016-02-19 LAB — HEPATITIS C RNA QUANTITATIVE: HCV Quantitative: NOT DETECTED IU/mL (ref <15)

## 2016-02-22 ENCOUNTER — Encounter (INDEPENDENT_AMBULATORY_CARE_PROVIDER_SITE_OTHER): Payer: Self-pay

## 2016-02-22 ENCOUNTER — Ambulatory Visit: Payer: No Typology Code available for payment source

## 2016-02-22 ENCOUNTER — Other Ambulatory Visit: Payer: Self-pay | Admitting: Internal Medicine

## 2016-02-22 DIAGNOSIS — I482 Chronic atrial fibrillation, unspecified: Secondary | ICD-10-CM

## 2016-02-22 DIAGNOSIS — I5032 Chronic diastolic (congestive) heart failure: Secondary | ICD-10-CM

## 2016-02-22 DIAGNOSIS — R0609 Other forms of dyspnea: Secondary | ICD-10-CM

## 2016-02-23 ENCOUNTER — Telehealth: Payer: Self-pay | Admitting: Internal Medicine

## 2016-02-23 NOTE — Telephone Encounter (Signed)
The patient has [facility] discount and they do not cover the cost of a holter.  The patient would have to pay 70.00 within 7 days to preventice. Patient stated he does not have the money so cancelled appt for 02/26/2016

## 2016-02-23 NOTE — Telephone Encounter (Signed)
Will route to Dr. Harrington Challenger to inform.

## 2016-02-26 ENCOUNTER — Encounter: Payer: Self-pay | Admitting: Internal Medicine

## 2016-02-26 ENCOUNTER — Other Ambulatory Visit: Payer: No Typology Code available for payment source | Admitting: *Deleted

## 2016-02-26 ENCOUNTER — Ambulatory Visit (INDEPENDENT_AMBULATORY_CARE_PROVIDER_SITE_OTHER): Payer: No Typology Code available for payment source | Admitting: Internal Medicine

## 2016-02-26 ENCOUNTER — Encounter (INDEPENDENT_AMBULATORY_CARE_PROVIDER_SITE_OTHER): Payer: Self-pay

## 2016-02-26 DIAGNOSIS — Z7901 Long term (current) use of anticoagulants: Secondary | ICD-10-CM

## 2016-02-26 DIAGNOSIS — K922 Gastrointestinal hemorrhage, unspecified: Secondary | ICD-10-CM

## 2016-02-26 DIAGNOSIS — L247 Irritant contact dermatitis due to plants, except food: Secondary | ICD-10-CM

## 2016-02-26 DIAGNOSIS — Z79899 Other long term (current) drug therapy: Secondary | ICD-10-CM

## 2016-02-26 DIAGNOSIS — I5032 Chronic diastolic (congestive) heart failure: Secondary | ICD-10-CM

## 2016-02-26 DIAGNOSIS — L237 Allergic contact dermatitis due to plants, except food: Secondary | ICD-10-CM | POA: Insufficient documentation

## 2016-02-26 DIAGNOSIS — I110 Hypertensive heart disease with heart failure: Secondary | ICD-10-CM

## 2016-02-26 DIAGNOSIS — G4733 Obstructive sleep apnea (adult) (pediatric): Secondary | ICD-10-CM

## 2016-02-26 DIAGNOSIS — F17210 Nicotine dependence, cigarettes, uncomplicated: Secondary | ICD-10-CM

## 2016-02-26 DIAGNOSIS — B182 Chronic viral hepatitis C: Secondary | ICD-10-CM

## 2016-02-26 DIAGNOSIS — I482 Chronic atrial fibrillation: Secondary | ICD-10-CM

## 2016-02-26 LAB — BASIC METABOLIC PANEL
BUN: 14 mg/dL (ref 7–25)
CALCIUM: 8.8 mg/dL (ref 8.6–10.3)
CO2: 28 mmol/L (ref 20–31)
CREATININE: 0.79 mg/dL (ref 0.70–1.33)
Chloride: 99 mmol/L (ref 98–110)
GLUCOSE: 86 mg/dL (ref 65–99)
Potassium: 4.1 mmol/L (ref 3.5–5.3)
Sodium: 136 mmol/L (ref 135–146)

## 2016-02-26 NOTE — Progress Notes (Signed)
CC: follow up for CHF, HTN  HPI:  [name] is a 55 y.o. with PMh as listed below presents for follow up for CHF, HTN, Afib.   Past Medical History:  Diagnosis Date  . Atrial fibrillation and flutter ([place]) 08/17/2015   A. S/p failed DCCV // b. Severe BAE on echo >> rate control strategy (has seen AF clinic) // c. Xarelto for anticoag (CHADS2-VASc=2 / CHF, HTN)  . Chronic diastolic CHF (congestive heart failure) ([place]) 08/30/2015   A. Echo 6/17: Apical HK, moderate focal basal and mild concentric LVH, EF 50-55, diffuse HK, trivial MR, severe BAE, mild TR, PASP 37  . Hepatitis C, chronic ([place]) 08/19/2015  . History of cardiac catheterization    a. LHC 6/17: LAD irregs, o/w no CAD  . OSA (obstructive sleep apnea) 11/21/2015  . Tobacco abuse    Patient has chronic Afib/flutter, DCHF. Had cath showing no CAD. ECHO 50-55% last. On xarelto for AFib. Failed DCCV, has been on rate control. Saw CHF team on 11/30 when he had dyspnea with minimal activity. His lasix was increased to 80mg  BID, was recommended to wear compression stockings. Could not afford compression stock, interested in the TED hose that we have in our clinic. His breathing is better and able to walk longer distance with increased lasix dose.    Being treated for his Hep C with zepatier and riba, follows Dr. Linus Morales  HTN/CHFAfib: on lasix 80mg  bid + dilt 240mg  daily. On xarelto 20mg  daily.   GI bleed: Having intermittent GI bleed, every few weeks, lasting sometimes for 1 day and sometimes longer, gets bright red to dark maroon colored blood which fills the toilet bowl. Resolves spontaneously. No melena, no hematemesis, no GERD symptoms. No family hx of colon cancer, no cscope in the past.   Had poison ivy infection 1 month ago, was getting better with OTC steroid cream. Had flare up again due to being exposes to his cousin who came to his house. States he gets that every time someone has poison ivy. Has rash on both arms and  under his breast.   Review of Systems:    Review of Systems  Constitutional: Negative for chills and fever.  Cardiovascular: Negative for chest pain and palpitations.  Gastrointestinal: Positive for blood in stool. Negative for abdominal pain, heartburn, nausea and vomiting.  Skin: Positive for rash.  Neurological: Negative for dizziness and headaches.     Physical Exam:  Vitals:   02/26/16 1529  BP: 136/72  Pulse: 88  Temp: 97.6 F (36.4 C)  TempSrc: Oral  SpO2: 100%  Weight: (!) 352 lb 14.4 oz (160.1 kg)  Height: 5\' 11"  (1.803 m)   Physical Exam  Constitutional: He is oriented to person, place, and time. He appears well-developed and well-nourished.  Obese   Eyes: Conjunctivae are normal. Pupils are equal, round, and reactive to light.  Neck: Normal range of motion. No JVD present.  Cardiovascular: Exam reveals no gallop and no friction rub.   No murmur heard. Irregular HR.   Respiratory: Breath sounds normal. No respiratory distress. He has no wheezes. He has no rales. He exhibits no tenderness.  GI: Soft. Bowel sounds are normal.  Musculoskeletal: Normal range of motion.  2+ pitting edema upto knees.   Neurological: He is alert and oriented to person, place, and time.  Skin:  Has area of flat hyperpigmentation under both breasts and also on both arms. No induration, No drainage, no open lesions, no vesicles.  Assessment & Plan:   See Encounters Tab for problem based charting.  Patient discussed with Dr. Lynnae January

## 2016-02-26 NOTE — Assessment & Plan Note (Signed)
Asked to continue OTC steroid cream. Also asked to use zyrtec.

## 2016-02-26 NOTE — Patient Instructions (Signed)
Please continue to use the over the counter cream. Also use Zyrtec tablets daily to reduce the chance of  future allergic reactions.   Keep your skin well moisturized.  Use the TED hose. Keep legs elevated.  Will refer you to the gastroenterologist for colonoscopy.

## 2016-02-26 NOTE — Assessment & Plan Note (Signed)
Waiting for medical appeal. Had sleep study. Wants a CPAP machine.

## 2016-02-26 NOTE — Assessment & Plan Note (Signed)
Gave TED hose from the clinic for chronic leg swelling. His breathing is better on lasix 80mg  bid. Continued same meds.

## 2016-02-26 NOTE — Assessment & Plan Note (Signed)
Having intermittent lower GI bleed. POC FOBT negative. No rectal lesions seen, normal rectal exam. This is likely mild mucosal bleed due to xarelto. Low suspicion for other lesions or malignancy. Never had cscope. Will refer him for cscope.

## 2016-02-27 ENCOUNTER — Encounter: Payer: Self-pay | Admitting: Gastroenterology

## 2016-02-27 NOTE — Progress Notes (Signed)
Teds hose placed in mail today. 

## 2016-02-28 NOTE — Progress Notes (Signed)
Internal Medicine Clinic Attending  Case discussed with Dr. Ahmed at the time of the visit.  We reviewed the resident's history and exam and pertinent patient test results.  I agree with the assessment, diagnosis, and plan of care documented in the resident's note. 

## 2016-02-29 ENCOUNTER — Ambulatory Visit (INDEPENDENT_AMBULATORY_CARE_PROVIDER_SITE_OTHER): Payer: No Typology Code available for payment source | Admitting: Internal Medicine

## 2016-02-29 ENCOUNTER — Encounter: Payer: Self-pay | Admitting: Internal Medicine

## 2016-02-29 VITALS — BP 137/85 | HR 70 | Temp 97.5°F | Wt 349.0 lb

## 2016-02-29 DIAGNOSIS — K7400 Hepatic fibrosis, unspecified: Secondary | ICD-10-CM

## 2016-02-29 DIAGNOSIS — B182 Chronic viral hepatitis C: Secondary | ICD-10-CM

## 2016-02-29 DIAGNOSIS — F1410 Cocaine abuse, uncomplicated: Secondary | ICD-10-CM

## 2016-02-29 HISTORY — DX: Hepatic fibrosis, unspecified: K74.00

## 2016-02-29 NOTE — Progress Notes (Signed)
   Subjective:    Patient ID: Frank Morales, male    DOB: Mar 09, 1961, 55 y.o.   MRN: [id number]  HPI Here for hepatits C follow up. Has genotype 1a with baseline resistance and took Zepatier and ribavirin for 16 weeks without problems, other than just taking ribavirin 200 mg twice a day initially instead of full dose.  Elastography with F3/4.  Rare beer.  EOT lab undetectable.  No complaints. No pain.    Review of Systems  Constitutional: Negative for fatigue.  Gastrointestinal: Negative for diarrhea.  Neurological: Negative for dizziness and headaches.       Objective:   Physical Exam  Constitutional: He appears well-developed and well-nourished. No distress.  Eyes: No scleral icterus.  Cardiovascular: Normal rate, regular rhythm and normal heart sounds.   Pulmonary/Chest: Breath sounds normal. No respiratory distress.  Abdominal:  obese  Musculoskeletal: He exhibits no edema.  Skin: No rash noted.   SH: rare beer, no other liquor, no drug use       Assessment & Plan:

## 2016-02-29 NOTE — Assessment & Plan Note (Signed)
?   If EGD indicated.  Has screening colonoscopy scheduled in January.  I alert Dr. [name]

## 2016-02-29 NOTE — Assessment & Plan Note (Signed)
Doing good.  Will check SVR 24 in 6 months with PharmD follow up.

## 2016-02-29 NOTE — Assessment & Plan Note (Signed)
Denies currenlty

## 2016-03-27 ENCOUNTER — Encounter: Payer: Self-pay | Admitting: *Deleted

## 2016-03-27 ENCOUNTER — Telehealth: Payer: Self-pay | Admitting: Internal Medicine

## 2016-03-27 NOTE — Telephone Encounter (Signed)
I spoke with pt who reports he has card from [facility] but this does not cover holter monitor. He cannot self pay for this.  Pt reports he has meeting next week to see if he would qualify for Medicare/Medicaid.  He is asking if we would write letter indicating we have ordered a monitor and that due to lack of insurance he cannot afford. He needs this letter to take to the meeting.  I told pt I would write letter and leave at front desk for him to pick up.

## 2016-03-27 NOTE — Telephone Encounter (Signed)
Frank Morales is calling because he states that his insurance would not cover his heart monitor. Patient is unclear on what steps to take next, please call. Thanks.

## 2016-04-04 ENCOUNTER — Ambulatory Visit: Payer: No Typology Code available for payment source | Admitting: Cardiology

## 2016-04-08 ENCOUNTER — Ambulatory Visit (INDEPENDENT_AMBULATORY_CARE_PROVIDER_SITE_OTHER): Payer: No Typology Code available for payment source | Admitting: Gastroenterology

## 2016-04-08 ENCOUNTER — Encounter (INDEPENDENT_AMBULATORY_CARE_PROVIDER_SITE_OTHER): Payer: Self-pay

## 2016-04-08 ENCOUNTER — Other Ambulatory Visit: Payer: Self-pay

## 2016-04-08 ENCOUNTER — Encounter: Payer: Self-pay | Admitting: Gastroenterology

## 2016-04-08 VITALS — BP 100/64 | HR 107 | Ht 71.0 in | Wt 350.8 lb

## 2016-04-08 DIAGNOSIS — G4733 Obstructive sleep apnea (adult) (pediatric): Secondary | ICD-10-CM

## 2016-04-08 DIAGNOSIS — I4892 Unspecified atrial flutter: Secondary | ICD-10-CM

## 2016-04-08 DIAGNOSIS — B182 Chronic viral hepatitis C: Secondary | ICD-10-CM

## 2016-04-08 DIAGNOSIS — K625 Hemorrhage of anus and rectum: Secondary | ICD-10-CM

## 2016-04-08 DIAGNOSIS — K7400 Hepatic fibrosis, unspecified: Secondary | ICD-10-CM

## 2016-04-08 DIAGNOSIS — I5032 Chronic diastolic (congestive) heart failure: Secondary | ICD-10-CM

## 2016-04-08 DIAGNOSIS — I4891 Unspecified atrial fibrillation: Secondary | ICD-10-CM

## 2016-04-08 MED ORDER — NA SULFATE-K SULFATE-MG SULF 17.5-3.13-1.6 GM/177ML PO SOLN: 1.0000 | Freq: Once | ORAL | 0 refills | Status: AC

## 2016-04-08 NOTE — Progress Notes (Signed)
[facility] Consult Note:  History: Frank Morales 04/08/2016  Referring physician: Ahmed, Tasrif, MD  Reason for consult/chief complaint: Blood In Stools (pt reports intermittent brb in his stools for "a little while"; has not seen any in the last 2 weeks; denies any abdominal pain or other gi complaints)   Subjective  HPI:  This is a 56-year-old man with morbid obesity and multiple medical comorbidities as outlined below referred by internal medicine resident clinic for rectal bleeding. I also received a message from the infectious disease physician last month indicating that he had recently treated this patient for hepatitis C and discovered advanced fibrosis on elastography. The patient is a limited historian, but reports intermittent painless rectal bleeding for at least a year. He denies constipation or abdominal pain, says his appetite is good and his weight stable. He denies dysphagia, odynophagia, nausea, vomiting, or early satiety.  ROS:  Review of Systems  Constitutional: Negative for appetite change and unexpected weight change.  HENT: Negative for mouth sores and voice change.   Eyes: Negative for pain and redness.  Respiratory: Positive for shortness of breath. Negative for cough.   Cardiovascular: Positive for leg swelling. Negative for chest pain and palpitations.  Genitourinary: Negative for dysuria and hematuria.  Musculoskeletal: Negative for arthralgias and myalgias.  Skin: Negative for pallor and rash.  Neurological: Negative for weakness and headaches.  Hematological: Negative for adenopathy.     Past Medical History: Past Medical History:  Diagnosis Date  . Atrial fibrillation and flutter (HCC) 08/17/2015   A. S/p failed DCCV // b. Severe BAE on echo >> rate control strategy (has seen AF clinic) // c. Xarelto for anticoag (CHADS2-VASc=2 / CHF, HTN)  . Chronic diastolic CHF (congestive heart failure) (HCC) 08/30/2015   A. Echo 6/17: Apical HK,  moderate focal basal and mild concentric LVH, EF 50-55, diffuse HK, trivial MR, severe BAE, mild TR, PASP 37  . Hepatitis C, chronic (HCC) 08/19/2015  . History of cardiac catheterization    a. LHC 6/17: LAD irregs, o/w no CAD  . OSA (obstructive sleep apnea) 11/21/2015  . Tobacco abuse    I reviewed his recent primary care and cardiology notes. They increased his furosemide due to volume overload.  Past Surgical History: Past Surgical History:  Procedure Laterality Date  . CARDIAC CATHETERIZATION N/A 08/21/2015   Procedure: Right/Left Heart Cath and Coronary Angiography;  Surgeon: David W Harding, MD;  Location: [facility] INVASIVE CV LAB;  Service: Cardiovascular;  Laterality: N/A;  . CARDIOVERSION N/A 09/22/2015   Procedure: CARDIOVERSION;  Surgeon: Peter C Nishan, MD;  Location: [facility] ENDOSCOPY;  Service: Cardiovascular;  Laterality: N/A;  . NO PAST SURGERIES       Family History: Family History  Problem Relation Age of Onset  . Hypertension Mother   . Diabetes Mother   . Pneumonia Father   . Hypertension Maternal Grandfather     Social History: Social History   Social History  . Marital status: Single    Spouse name: N/A  . Number of children: N/A  . Years of education: N/A   Occupational History  . unemployed     Previous cook    Social History Main Topics  . Smoking status: Current Every Day Smoker    Packs/day: 0.30    Types: Cigarettes  . Smokeless tobacco: Never Used     Comment: 3 -4 cigs/day.  . Alcohol use 3.0 oz/week    5 Standard drinks or equivalent per week       Comment: occ beer.  . Drug use: No  . Sexual activity: Not Asked   Other Topics Concern  . None   Social History Narrative  . None    Allergies: No Known Allergies  Outpatient Meds: Current Outpatient Prescriptions  Medication Sig Dispense Refill  . diltiazem (CARTIA XT) 240 MG 24 hr capsule Take 1 capsule (240 mg total) by mouth daily. 90 capsule 2  . fluticasone (FLONASE) 50 MCG/ACT nasal  spray Place 1 spray into both nostrils daily. 16 g 2  . furosemide (LASIX) 40 MG tablet Take 2 tablets (80 mg total) by mouth 2 (two) times daily. 120 tablet 3  . ribavirin (REBETOL) 200 MG capsule Take 3 capsules (600 mg total) by mouth 2 (two) times daily. 168 capsule 3  . rivaroxaban (XARELTO) 20 MG TABS tablet Take 1 tablet (20 mg total) by mouth daily with supper. 90 tablet 2  . Na Sulfate-K Sulfate-Mg Sulf 17.5-3.13-1.6 GM/180ML SOLN Take 1 kit by mouth once. 354 mL 0  . potassium chloride (K-DUR) 10 MEQ tablet Take 1 tablet (10 mEq total) by mouth 2 (two) times daily. 180 tablet 3   No current facility-administered medications for this visit.       ___________________________________________________________________ Objective   Exam:  BP 100/64   Pulse (!) 107   Ht 5' 11" (1.803 m)   Wt (!) 350 lb 12.8 oz (159.1 kg)   BMI 48.93 kg/m    General: this is a(n) Chronically ill-appearing, morbidly obese man.   Eyes: sclera anicteric, no redness  ENT: oral mucosa moist without lesions, no cervical or supraclavicular lymphadenopathy, good dentition  CV: RRR without murmur, S1/S2, no JVD, severe lower leg edema bilaterally with brawny erythema  Resp: clear to auscultation bilaterally, normal RR and effort noted  GI: soft, no tenderness, with active bowel sounds. Cannot assess hepatosplenomegaly due to body habitus.  Rectal exam: Limited due to patient's body habitus. No abnormalities felt in the distal rectum, no anal fissure, no external lesions.  Skin no jaundice or rash  Neuro: awake, alert and oriented x 3. Normal gross motor function and fluent speech  Labs:  CBC Latest Ref Rng & Units 02/15/2016 01/11/2016 12/08/2015  WBC 3.8 - 10.8 K/uL 4.0 4.3 5.4  Hemoglobin 13.2 - 17.1 g/dL 13.9 13.2 13.6  Hematocrit 38.5 - 50.0 % 44.6 40.0 39.8  Platelets 140 - 400 K/uL 162 163 173   CMP Latest Ref Rng & Units 02/26/2016 01/08/2016 12/08/2015  Glucose 65 - 99 mg/dL 86 85  100(H)  BUN 7 - 25 mg/dL 14 15 17  Creatinine 0.70 - 1.33 mg/dL 0.79 0.86 0.82  Sodium 135 - 146 mmol/L 136 138 136  Potassium 3.5 - 5.3 mmol/L 4.1 4.0 4.3  Chloride 98 - 110 mmol/L 99 101 101  CO2 20 - 31 mmol/L 28 26 26  Calcium 8.6 - 10.3 mg/dL 8.8 8.9 9.1  Total Protein 6.5 - 8.1 g/dL - - -  Total Bilirubin 0.3 - 1.2 mg/dL - - -  Alkaline Phos 38 - 126 U/L - - -  AST 15 - 41 U/L - - -  ALT 17 - 63 U/L - - -   Last echocardiogram shows EF 50% but dilated left atrium and right atrium with elevated pulmonary pressure. It is a technically limited study due to his body habitus.  Assessment: Encounter Diagnoses  Name Primary?  . Rectal bleeding Yes  . Atrial fibrillation and flutter (HCC)   . Chronic diastolic CHF (congestive   heart failure) ([place])   . OSA (obstructive sleep apnea)   . Chronic hepatitis C without hepatic coma ([place])   . Liver fibrosis ([place])   . Severe obesity (BMI >= 40) (HCC)   Sounds likely to be benign anorectal bleeding such as hemorrhoids. Colonoscopy is increased risk due to his multiple comorbidities, and it therefore needs to be done in the hospital outpatient endoscopy lab. His borderline low platelets and fibrosis seen on imaging mean he should be screened for possible esophageal varices Plan:  EGD and colonoscopy-  he is agreeable  The benefits and risks of the planned procedure were described in detail with the patient or (when appropriate) their health care proxy.  Risks were outlined as including, but not limited to, bleeding, infection, perforation, adverse medication reaction leading to cardiac or pulmonary decompensation, or pancreatitis (if ERCP).  The limitation of incomplete mucosal visualization was also discussed.  No guarantees or warranties were given.  We will get cardiac clearance to stop his OAC 2 days prior to procedure.  Thank you for the courtesy of this consult.  Please call me with any questions or concerns.  [name]  CC:  Dellia Nims, MD

## 2016-04-08 NOTE — Patient Instructions (Signed)
If you are age 55 or older, your body mass index should be between 23-30. Your Body mass index is 48.93 kg/m. If this is out of the aforementioned range listed, please consider follow up with your Primary Care Provider.  If you are age 34 or younger, your body mass index should be between 19-25. Your Body mass index is 48.93 kg/m. If this is out of the aformentioned range listed, please consider follow up with your Primary Care Provider.   You have been scheduled for an endoscopy and colonoscopy. Please follow the written instructions given to you at your visit today. Please pick up your prep supplies at the pharmacy within the next 1-3 days. If you use inhalers (even only as needed), please bring them with you on the day of your procedure. Your physician has requested that you go to www.startemmi.com and enter the access code given to you at your visit today. This web site gives a general overview about your procedure. However, you should still follow specific instructions given to you by our office regarding your preparation for the procedure.  Thank you for choosing [facility]  Dr Wilfrid Lund III

## 2016-04-22 ENCOUNTER — Encounter: Payer: Self-pay | Admitting: Internal Medicine

## 2016-04-22 ENCOUNTER — Ambulatory Visit (INDEPENDENT_AMBULATORY_CARE_PROVIDER_SITE_OTHER): Payer: No Typology Code available for payment source | Admitting: Internal Medicine

## 2016-04-22 ENCOUNTER — Telehealth (HOSPITAL_COMMUNITY): Payer: Self-pay | Admitting: *Deleted

## 2016-04-22 ENCOUNTER — Other Ambulatory Visit (HOSPITAL_COMMUNITY): Payer: Self-pay | Admitting: *Deleted

## 2016-04-22 VITALS — BP 130/80 | HR 56 | Ht 71.0 in | Wt 346.0 lb

## 2016-04-22 DIAGNOSIS — I4892 Unspecified atrial flutter: Secondary | ICD-10-CM

## 2016-04-22 DIAGNOSIS — I5043 Acute on chronic combined systolic (congestive) and diastolic (congestive) heart failure: Secondary | ICD-10-CM

## 2016-04-22 NOTE — Patient Instructions (Signed)
Your physician recommends that you continue on your current medications as directed. Please refer to the Current Medication list given to you today.  Your physician recommends that you schedule a follow-up appointment in: end of May, 2018 with Dr. Harrington Challenger.

## 2016-04-22 NOTE — Progress Notes (Signed)
Cardiology Office Note   Date:  04/22/2016   ID:  Frank Morales, DOB [birth year], MRN [id number]  PCP:  Dellia Nims, MD  Cardiologist:   Dorris Carnes, MD    F?U of atrial flutter and diastolic CHF      History of Present Illness: Frank Morales is a 56 y.o. male with a history of atrial flutter, diastolic CHF, OSA obesity,, substance abude  Cath in June 2017:  No sigif CAD  On xarelto  Cradoversion in July 2017   I saw him in Sept  Volume increased   Seen by Kathleen Argue in October  Dyspnea  Lasix increased   Seen b V Bhagat in November    Curr the pt denies palpitations  Breathing is fair  If he goes up/down driveway 2 x is sob No dizziness   Edema about at its best  Does goe up down Says he is watching salt  Not drinking much  When takes lasix he urinates        Current Meds  Medication Sig  . diltiazem (CARTIA XT) 240 MG 24 hr capsule Take 1 capsule (240 mg total) by mouth daily.  . fluticasone (FLONASE) 50 MCG/ACT nasal spray Place 1 spray into both nostrils daily.  . furosemide (LASIX) 40 MG tablet Take 2 tablets (80 mg total) by mouth 2 (two) times daily.  . ribavirin (REBETOL) 200 MG capsule Take 3 capsules (600 mg total) by mouth 2 (two) times daily.  . rivaroxaban (XARELTO) 20 MG TABS tablet Take 1 tablet (20 mg total) by mouth daily with supper.     Allergies:   Patient has no known allergies.   Past Medical History:  Diagnosis Date  . Atrial fibrillation and flutter ([place]) 08/17/2015   A. S/p failed DCCV // b. Severe BAE on echo >> rate control strategy (has seen AF clinic) // c. Xarelto for anticoag (CHADS2-VASc=2 / CHF, HTN)  . Chronic diastolic CHF (congestive heart failure) ([place]) 08/30/2015   A. Echo 6/17: Apical HK, moderate focal basal and mild concentric LVH, EF 50-55, diffuse HK, trivial MR, severe BAE, mild TR, PASP 37  . Hepatitis C, chronic ([place]) 08/19/2015  . History of cardiac catheterization    a. LHC 6/17: LAD irregs, o/w no CAD  . OSA  (obstructive sleep apnea) 11/21/2015  . Tobacco abuse     Past Surgical History:  Procedure Laterality Date  . CARDIAC CATHETERIZATION N/A 08/21/2015   Procedure: Right/Left Heart Cath and Coronary Angiography;  Surgeon: Leonie Man, MD;  Location: [place] CV LAB;  Service: Cardiovascular;  Laterality: N/A;  . CARDIOVERSION N/A 09/22/2015   Procedure: CARDIOVERSION;  Surgeon: Josue Hector, MD;  Location[facility] ENDOSCOPY;  Service: Cardiovascular;  Laterality: N/A;  . NO PAST SURGERIES       Social History:  The patient  reports that he has been smoking Cigarettes.  He has been smoking about 0.30 packs per day. He has never used smokeless tobacco. He reports that he drinks about 3.0 oz of alcohol per week . He reports that he does not use drugs.   Family History:  The patient's family history includes Diabetes in his mother; Hypertension in his maternal grandfather and mother; Pneumonia in his father.    ROS:  Please see the history of present illness. All other systems are reviewed and  Negative to the above problem except as noted.    PHYSICAL EXAM: VS:  BP 130/80   Pulse (!) 56  Ht 5\' 11"  (1.803 m)   Wt (!) 346 lb (156.9 kg)   SpO2 97%   BMI 48.26 kg/m   GEN: Morbidly obese 56 yo in NAD   in no acute distress  HEENT: normal  Neck: no JVD, carotid bruits, or masses Cardiac:  Irreg rate/rhythm; no murmurs, rubs, or gallops,1+ edema  Respiratory:  clear to auscultation bilaterally, normal work of breathing GI: soft, nontender, nondistended, + BS  No hepatomegaly  MS: no deformity Moving all extremities   Skin: warm and dry, no rash Neuro:  Strength and sensation are intact Psych: euthymic mood, full affect   EKG:  EKG is not  ordered today.   Lipid Panel    Component Value Date/Time   CHOL 105 08/17/2015 1233   TRIG 182 (H) 08/17/2015 1233   HDL 28 (L) 08/17/2015 1233   CHOLHDL 3.8 08/17/2015 1233   VLDL 36 08/17/2015 1233   LDLCALC 41 08/17/2015 1233      Wt  Readings from Last 3 Encounters:  04/22/16 (!) 346 lb (156.9 kg)  04/08/16 (!) 350 lb 12.8 oz (159.1 kg)  02/29/16 (!) 349 lb (158.3 kg)      ASSESSMENT AND PLAN:  1  Atrial flutter  Permanent  Keep on rate controla and Xarelto  Has appt in afib clinic  I wll see in may  2  Chronic diastolic CHF  Volume is up some   I am not sure that pushing will add much  Watch fluids   Would be good to get on CPAP  3  Sleep apnea  Still not approved for coverage  Medicaid card coming    I will see at end of may 2018     Current medicines are reviewed at length with the patient today.  The patient does not have concerns regarding medicines.  Signed, Dorris Carnes, MD  04/22/2016 10:51 AM    [address], [place], [place]  [address] Phone: [phone]; Fax: [phone]

## 2016-04-22 NOTE — Telephone Encounter (Signed)
LM with pt friend to call back to cncl pt appt for tomorrow 04/23/2016 since pt is seeing Dr. Harrington Challenger today

## 2016-04-23 ENCOUNTER — Other Ambulatory Visit: Payer: Self-pay | Admitting: Pharmacist

## 2016-04-23 ENCOUNTER — Inpatient Hospital Stay (HOSPITAL_COMMUNITY)
Admission: RE | Admit: 2016-04-23 | Payer: No Typology Code available for payment source | Source: Ambulatory Visit | Admitting: Nurse Practitioner

## 2016-04-23 DIAGNOSIS — E876 Hypokalemia: Secondary | ICD-10-CM

## 2016-04-23 MED ORDER — POTASSIUM CHLORIDE ER 10 MEQ PO TBCR: 10.0000 meq | EXTENDED_RELEASE_TABLET | Freq: Two times a day (BID) | ORAL | 3 refills | Status: DC

## 2016-04-23 NOTE — Progress Notes (Signed)
Patient requested transfer potassium prescription to [facility]. Prescription sent.

## 2016-05-14 ENCOUNTER — Ambulatory Visit (HOSPITAL_COMMUNITY)
Admission: RE | Admit: 2016-05-14 | Discharge: 2016-05-14 | Disposition: A | Discharge status: Home / Self Care | Payer: No Typology Code available for payment source | Source: Ambulatory Visit | Attending: Nurse Practitioner | Admitting: Nurse Practitioner

## 2016-05-14 ENCOUNTER — Encounter (HOSPITAL_COMMUNITY): Payer: Self-pay | Admitting: Nurse Practitioner

## 2016-05-14 ENCOUNTER — Telehealth: Payer: Self-pay | Admitting: Gastroenterology

## 2016-05-14 VITALS — BP 118/74 | HR 100 | Ht 71.0 in | Wt 342.4 lb

## 2016-05-14 DIAGNOSIS — Z8249 Family history of ischemic heart disease and other diseases of the circulatory system: Secondary | ICD-10-CM | POA: Insufficient documentation

## 2016-05-14 DIAGNOSIS — I5032 Chronic diastolic (congestive) heart failure: Secondary | ICD-10-CM | POA: Insufficient documentation

## 2016-05-14 DIAGNOSIS — F17210 Nicotine dependence, cigarettes, uncomplicated: Secondary | ICD-10-CM | POA: Insufficient documentation

## 2016-05-14 DIAGNOSIS — I482 Chronic atrial fibrillation, unspecified: Secondary | ICD-10-CM

## 2016-05-14 DIAGNOSIS — Z79899 Other long term (current) drug therapy: Secondary | ICD-10-CM | POA: Insufficient documentation

## 2016-05-14 DIAGNOSIS — Z7901 Long term (current) use of anticoagulants: Secondary | ICD-10-CM | POA: Insufficient documentation

## 2016-05-14 DIAGNOSIS — I4892 Unspecified atrial flutter: Secondary | ICD-10-CM | POA: Insufficient documentation

## 2016-05-14 DIAGNOSIS — Z6841 Body Mass Index (BMI) 40.0 and over, adult: Secondary | ICD-10-CM | POA: Insufficient documentation

## 2016-05-14 DIAGNOSIS — Z833 Family history of diabetes mellitus: Secondary | ICD-10-CM | POA: Insufficient documentation

## 2016-05-14 DIAGNOSIS — I428 Other cardiomyopathies: Secondary | ICD-10-CM | POA: Insufficient documentation

## 2016-05-14 DIAGNOSIS — Z7951 Long term (current) use of inhaled steroids: Secondary | ICD-10-CM | POA: Insufficient documentation

## 2016-05-14 DIAGNOSIS — I110 Hypertensive heart disease with heart failure: Secondary | ICD-10-CM | POA: Insufficient documentation

## 2016-05-14 NOTE — Progress Notes (Signed)
Patient ID: Frank Morales, male   DOB: [birth year], 56 y.o.   MRN: [id number]     Primary Care Physician: Dellia Nims, MD Referring Physician: Dr. Janace Aris is a 56 y.o. male with a h/o hospitalization 6/7, NICM, new dx HCV antibody positive AFIB/flutter, CHF, Tobacco abuse, HTN, presumed OSA, Morbid obesity, cocaine abuse thought to have contributed to cardiomyopathy, new dx of HCV  and is afib clinic for f/u. He is on xarelto for chadsvasc score of at least 2(HTN, Diastolic dysfunction). He gives a history of not feeling well x 6-9 months with fatigue, shortness of breath, PND, orthopnea, swelling, prior to hospitalization 09/22/15 . States no further cocaine use x one month, continues to smoke, about one cigarette a day.. Feels better than prior to admission but still short of breath with minimal activity.  He was seen in f/u with Dr. Harrington Challenger and set up for cardioversion, pt did not convert.He continues on xarelto and understands stroke risk. Rate controlled. Echo showed left atrium of 49 mm. He is undergoing treatment for new HepC diagnosis.Currently denies any alcohol or illicit drugs. He has gained from 347 to 350 lbs. States that he ate well at a family reunio this weekend. Fluid status is stable. + for LLE better in am and worse in pm.  F/u in afib clinic and is in permanent afib/flutter. He feels at baseline. HR is 100 but he did not take am dilt/lasix but will take on returning home. On last visit with Dr. Harrington Challenger, when he had taken meds, HR controlled in the 50's. States that his fluid status is stable. He just got medicaid and was able to get cpap machine. He is trying very hard to avoid salt. Very rarely notices palpitations.  Today, he denies symptoms of chest pain,orthopnea, PND,  dizziness, presyncope, syncope, or neurologic sequela.+ for exertional dyspnea.  Chronic LLE, palpitations at times, mild exertional dyspnea. The patient is tolerating medications without  difficulties and is otherwise without complaint today.   Past Medical History:  Diagnosis Date  . Atrial fibrillation and flutter ([place]) 08/17/2015   A. S/p failed DCCV // b. Severe BAE on echo >> rate control strategy (has seen AF clinic) // c. Xarelto for anticoag (CHADS2-VASc=2 / CHF, HTN)  . Chronic diastolic CHF (congestive heart failure) ([place]) 08/30/2015   A. Echo 6/17: Apical HK, moderate focal basal and mild concentric LVH, EF 50-55, diffuse HK, trivial Morales, severe BAE, mild TR, PASP 37  . Hepatitis C, chronic ([place]) 08/19/2015  . History of cardiac catheterization    a. LHC 6/17: LAD irregs, o/w no CAD  . OSA (obstructive sleep apnea) 11/21/2015  . Tobacco abuse    Past Surgical History:  Procedure Laterality Date  . CARDIAC CATHETERIZATION N/A 08/21/2015   Procedure: Right/Left Heart Cath and Coronary Angiography;  Surgeon: Leonie Man, MD;  Location: [facility];  Service: Cardiovascular;  Laterality: N/A;  . CARDIOVERSION N/A 09/22/2015   Procedure: CARDIOVERSION;  Surgeon: Josue Hector, MD;  [facility] ENDOSCOPY;  Service: Cardiovascular;  Laterality: N/A;  . NO PAST SURGERIES      Current Outpatient Prescriptions  Medication Sig Dispense Refill  . diltiazem (CARTIA XT) 240 MG 24 hr capsule Take 1 capsule (240 mg total) by mouth daily. 90 capsule 2  . fluticasone (FLONASE) 50 MCG/ACT nasal spray Place 1 spray into both nostrils daily. 16 g 2  . furosemide (LASIX) 40 MG tablet Take 2 tablets (80 mg total)  by mouth 2 (two) times daily. 120 tablet 3  . potassium chloride (K-DUR) 10 MEQ tablet Take 1 tablet (10 mEq total) by mouth 2 (two) times daily. 180 tablet 3  . rivaroxaban (XARELTO) 20 MG TABS tablet Take 1 tablet (20 mg total) by mouth daily with supper. 90 tablet 2   No current facility-administered medications for this encounter.     No Known Allergies  Social History   Social History  . Marital status: Single    Spouse name: N/A  . Number of children: N/A    . Years of education: N/A   Occupational History  . unemployed     Previous Training and development officer    Social History Main Topics  . Smoking status: Current Every Day Smoker    Packs/day: 0.30    Types: Cigarettes  . Smokeless tobacco: Never Used     Comment: 3 -4 cigs/day.  . Alcohol use 3.0 oz/week    5 Standard drinks or equivalent per week     Comment: occ beer.  . Drug use: No  . Sexual activity: Not on file   Other Topics Concern  . Not on file   Social History Narrative  . No narrative on file    Family History  Problem Relation Age of Onset  . Hypertension Mother   . Diabetes Mother   . Pneumonia Father   . Hypertension Maternal Grandfather     ROS- All systems are reviewed and negative except as per the HPI above  Physical Exam: Vitals:   05/14/16 0926  BP: 118/74  Pulse: 100  Weight: (!) 342 lb 6.4 oz (155.3 kg)  Height: 5\' 11"  (1.803 m)    GEN- The patient is well appearing, alert and oriented x 3 today.   Head- normocephalic, atraumatic Eyes-  Sclera clear, conjunctiva pink Ears- hearing intact Oropharynx- clear Neck- supple, no JVP Lymph- no cervical lymphadenopathy Lungs- Clear to ausculation bilaterally, normal work of breathing Heart- irregular rate and rhythm, no murmurs, rubs or gallops, PMI not laterally displaced GI- soft, NT, ND, + BS Extremities- no clubbing, cyanosis, or edema MS- no significant deformity or atrophy Skin- no rash or lesion Psych- euthymic mood, full affect Neuro- strength and sensation are intact  EKG-Afib at 100 bpm, qrs int 112 ms, qtc 477 ms Epic records reviewed Echo-  *[facility]*  [address], [place] [address]  [phone]  ------------------------------------------------------------------- Transthoracic Echocardiography  Patient: Frank Morales  #: [id number] Study Date: 08/18/2015 Gender: M Age: 77 Height: 180.3 cm Weight: 155.7 kg BSA: 2.87 m^2 Pt. Status: Room: 2W37C  ADMITTING Annia Belt ATTENDING Annia Belt ORDERING Annia Belt REFERRING [place], [place], Inpatient SONOGRAPHER Darlina Sicilian, RDCS  cc:  ------------------------------------------------------------------- LV EF: 50% - 55%  ------------------------------------------------------------------- Indications: Atrial flutter 427.32.  ------------------------------------------------------------------- History: PMH: Chest pain. Dyspnea. PMH: Syncope. Risk factors: Current tobacco use.  ------------------------------------------------------------------- Study Conclusions  - Left ventricle: Difficult acoustical windows make assessment of  wall motion difficult even with definity. Study is suggestive of  apical hypokinesis. The cavity size was normal. There was  moderate focal basal and mild concentric hypertrophy. Systolic  function was normal. The estimated ejection fraction was in the  range of 50% to 55%. Diffuse hypokinesis. The study was not  technically sufficient to allow evaluation of LV diastolic  dysfunction due to atrial fibrillation. - Aortic valve: Trileaflet; mildly thickened, mildly calcified  leaflets. - Mitral valve: There  was trivial regurgitation. - Left atrium: The atrium was severely dilated. - Right atrium: The atrium was severely dilated. - Tricuspid valve: There was mild regurgitation. - Pulmonary arteries: PA peak pressure: 37 mm Hg (S).  Impressions:  - The right ventricular systolic pressure was increased consistent  with mild pulmonary hypertension.   08/21/15  Cath-Angiographically minimal coronary artery disease. Large draping vessels. Several very tortuous  Mildly elevated  secondary pulmonary hypertension  Mildly elevated signal April medication for elevated LVEDP. Suggest more diastolic dysfunction  Assessment and Plan: 1. Permanent controlled Afib/flutter Failed remote cardioversion Think chance of restoring to SR is very low at this point due to severely  dilated left atrium and morbid obesity Continue diltiazem    2. Lifestyle issues Encouraged weight loss, increased activity Smoking cessation encouraged Denies alcohol, caffeine use States no cocaine use, used 3 years in past  Just able to get cpap machine, stressed importance of regular use  3. HF Stable Limit fluids Limit salt Diuretic Weigh daily  F/u as needed in afib clinic F/u with Dr. Harrington Challenger as scheduled in May   Kellis Topete C. Eryc Bodey, [facility] [address] [place],  [address] [phone]

## 2016-05-14 NOTE — Telephone Encounter (Signed)
Spoke to patient, he just now called to say he has nobody to bring him home day of the procedure. I have encouraged him to find a neighbor or friend that can help him out. He will try to contact someone and call us back.

## 2016-05-15 ENCOUNTER — Other Ambulatory Visit: Payer: Self-pay

## 2016-05-15 ENCOUNTER — Telehealth: Payer: Self-pay | Admitting: Gastroenterology

## 2016-05-15 MED ORDER — NA SULFATE-K SULFATE-MG SULF 17.5-3.13-1.6 GM/177ML PO SOLN: 1.0000 | Freq: Once | ORAL | 0 refills | Status: AC

## 2016-05-15 MED FILL — SUPREP BOWEL PREP KIT: 17.5-3.13-1 | 1 days supply | Qty: 354 | Fill #0

## 2016-05-15 NOTE — Telephone Encounter (Signed)
Rx sent as requested.

## 2016-05-20 NOTE — Telephone Encounter (Signed)
Spoke to patient he did find someone to help bring him home after procedure tomorrow.

## 2016-05-21 ENCOUNTER — Ambulatory Visit (HOSPITAL_COMMUNITY)
Admission: RE | Admit: 2016-05-21 | Payer: No Typology Code available for payment source | Source: Ambulatory Visit | Admitting: Gastroenterology

## 2016-05-21 ENCOUNTER — Encounter (HOSPITAL_COMMUNITY): Payer: Self-pay | Admitting: Anesthesiology

## 2016-05-21 ENCOUNTER — Encounter (HOSPITAL_COMMUNITY): Admission: RE | Payer: Self-pay | Source: Ambulatory Visit

## 2016-05-21 ENCOUNTER — Telehealth: Payer: Self-pay

## 2016-05-21 SURGERY — ESOPHAGOGASTRODUODENOSCOPY (EGD) WITH PROPOFOL
Anesthesia: Monitor Anesthesia Care

## 2016-05-21 NOTE — Telephone Encounter (Signed)
Patient No Showed for procedure today over at [facility], patient did not call. Per Dr. [name] nothing further to be done.

## 2016-06-27 ENCOUNTER — Other Ambulatory Visit: Payer: Self-pay

## 2016-06-27 NOTE — Telephone Encounter (Signed)
Called pt for medication management and to assess refill status of medications. Pt reports recently picking up one month supply of furosemide. Pt states that he has diltiazem and rivaroxaban medication remaining. Pt reports needing a refill of fluticasone. Refill request submitted for fluticasone.   Pt reports have no co-pay when using the orange card in past or currently with [name]. Pt reports having difficulty when paying for the co-pays if the medication is filled at [facility]. Pt would prefer to receive all medications from [facility] if possible.

## 2016-06-28 MED ORDER — FLUTICASONE PROPIONATE 50 MCG/ACT NA SUSP: 1.0000 | Freq: Every day | NASAL | 2 refills | Status: DC

## 2016-07-01 ENCOUNTER — Telehealth: Payer: Self-pay | Admitting: Gastroenterology

## 2016-07-01 NOTE — Telephone Encounter (Signed)
Patient no showed for endo/colon procedure at [facility], do you want me to reschedule him? Thank you.

## 2016-07-02 NOTE — Telephone Encounter (Signed)
These procedure slots at the hospital are difficult to get.  I would like him to know that by not showing for a double procedure slot (EGD/colonoscopy), he wasted some very valuable time I could have used for other patients.  I am willing to allow one re-schedule for EGD and colonoscopy at [facility] outpatient endoscopy. He would need new prep instructions.  Please check, but I think he was to hold his Xarelto for 2 days prior as well.  He would need those instructions as well.  It will probably be July before there is an open slot for 2 scopes like this.  If he agrees to that, then I will give him another chance.  If he either cancels or does not show for that scope, I will not reschedule.

## 2016-07-03 NOTE — Telephone Encounter (Signed)
Patient aware of pre-visit appointment on 4/23 and endo/colon scheduled for 5/4 at [facility]. Patient also aware that if he should cancel or No Show then he will not be rescheduled. Patient had arranged for ride prior to scheduling. Will confirm with Dr. Harrington Challenger re: Xarelto if still able to have patient hold this med 2 days prior.

## 2016-07-04 ENCOUNTER — Telehealth: Payer: Self-pay

## 2016-07-04 NOTE — Telephone Encounter (Signed)
Received clearance on Xarelto:  Fay Records, MD  Doristine Counter, RN        OK to stop Xarelto as planned Resume after procedure    See letter to Dr. Harrington Challenger.

## 2016-07-08 ENCOUNTER — Ambulatory Visit (AMBULATORY_SURGERY_CENTER): Payer: Self-pay | Admitting: *Deleted

## 2016-07-08 VITALS — Ht 71.0 in | Wt 347.0 lb

## 2016-07-08 DIAGNOSIS — K625 Hemorrhage of anus and rectum: Secondary | ICD-10-CM

## 2016-07-08 MED ORDER — NA SULFATE-K SULFATE-MG SULF 17.5-3.13-1.6 GM/177ML PO SOLN: ORAL | 0 refills | Status: DC

## 2016-07-08 MED FILL — SUPREP BOWEL PREP KIT: 17.5-3.13-1 | 1 days supply | Qty: 354 | Fill #0

## 2016-07-08 MED FILL — FLUTICASONE PROP 50 MCG SPR: 50 | 30 days supply | Qty: 16 | Fill #1

## 2016-07-08 NOTE — Progress Notes (Signed)
Patient denies any allergies to eggs or soy. Patient denies any problems with anesthesia/sedation. Patient denies any oxygen use at home and does not take any diet/weight loss medications. NO email per pt.

## 2016-07-18 NOTE — Anesthesia Preprocedure Evaluation (Signed)
Anesthesia Evaluation  Patient identified by MRN, date of birth, ID band Patient awake    Reviewed: Allergy & Precautions, H&P , Patient's Chart, lab work & pertinent test results, reviewed documented beta blocker date and time   Airway Mallampati: II  TM Distance: >3 FB Neck ROM: full    Dental no notable dental hx.    Pulmonary Current Smoker,    Pulmonary exam normal breath sounds clear to auscultation       Cardiovascular  Rhythm:regular Rate:Normal     Neuro/Psych    GI/Hepatic   Endo/Other    Renal/GU      Musculoskeletal   Abdominal   Peds  Hematology   Anesthesia Other Findings OSA  Atrial fibrillation and flutter. S/p failed DCCV // b. Severe BAE on echo >> rate control strategy (has seen AF clinic) // c. Xarelto for anticoag  Chronic diastolic CHF (congestive heart failure) .  Echo 6/17: Apical HK, moderate focal basal and mild concentric LVH, EF 50-55, diffuse HK, trivial MR, severe BAE, mild TR, PASP 37 Hepatitis C, chronic  cardiac catheterization... 6/17: LAD irregs, o/w no CAD         Reproductive/Obstetrics                             Anesthesia Physical Anesthesia Plan  ASA: III  Anesthesia Plan: MAC   Post-op Pain Management:    Induction: Intravenous  Airway Management Planned: Mask and Natural Airway  Additional Equipment:   Intra-op Plan:   Post-operative Plan:   Informed Consent: I have reviewed the patients History and Physical, chart, labs and discussed the procedure including the risks, benefits and alternatives for the proposed anesthesia with the patient or authorized representative who has indicated his/her understanding and acceptance.   Dental Advisory Given  Plan Discussed with: CRNA and Surgeon  Anesthesia Plan Comments:         Anesthesia Quick Evaluation

## 2016-07-19 ENCOUNTER — Ambulatory Visit (HOSPITAL_COMMUNITY): Payer: Medicaid Other | Admitting: Anesthesiology

## 2016-07-19 ENCOUNTER — Encounter (HOSPITAL_COMMUNITY): Payer: Self-pay

## 2016-07-19 ENCOUNTER — Encounter (HOSPITAL_COMMUNITY)
Admission: RE | Disposition: A | Discharge status: Home / Self Care | Payer: Self-pay | Source: Ambulatory Visit | Attending: Gastroenterology

## 2016-07-19 ENCOUNTER — Ambulatory Visit (HOSPITAL_COMMUNITY)
Admission: RE | Admit: 2016-07-19 | Discharge: 2016-07-19 | Disposition: A | Discharge status: Home / Self Care | Payer: Medicaid Other | Source: Ambulatory Visit | Attending: Gastroenterology | Admitting: Gastroenterology

## 2016-07-19 DIAGNOSIS — I4892 Unspecified atrial flutter: Secondary | ICD-10-CM | POA: Diagnosis not present

## 2016-07-19 DIAGNOSIS — Z79899 Other long term (current) drug therapy: Secondary | ICD-10-CM | POA: Diagnosis not present

## 2016-07-19 DIAGNOSIS — B182 Chronic viral hepatitis C: Secondary | ICD-10-CM | POA: Insufficient documentation

## 2016-07-19 DIAGNOSIS — R933 Abnormal findings on diagnostic imaging of other parts of digestive tract: Secondary | ICD-10-CM | POA: Insufficient documentation

## 2016-07-19 DIAGNOSIS — I110 Hypertensive heart disease with heart failure: Secondary | ICD-10-CM | POA: Diagnosis not present

## 2016-07-19 DIAGNOSIS — Z7901 Long term (current) use of anticoagulants: Secondary | ICD-10-CM | POA: Diagnosis not present

## 2016-07-19 DIAGNOSIS — I4891 Unspecified atrial fibrillation: Secondary | ICD-10-CM

## 2016-07-19 DIAGNOSIS — D696 Thrombocytopenia, unspecified: Secondary | ICD-10-CM | POA: Insufficient documentation

## 2016-07-19 DIAGNOSIS — D122 Benign neoplasm of ascending colon: Secondary | ICD-10-CM

## 2016-07-19 DIAGNOSIS — I5032 Chronic diastolic (congestive) heart failure: Secondary | ICD-10-CM | POA: Insufficient documentation

## 2016-07-19 DIAGNOSIS — K740 Hepatic fibrosis: Secondary | ICD-10-CM | POA: Diagnosis not present

## 2016-07-19 DIAGNOSIS — K5730 Diverticulosis of large intestine without perforation or abscess without bleeding: Secondary | ICD-10-CM | POA: Diagnosis not present

## 2016-07-19 DIAGNOSIS — K625 Hemorrhage of anus and rectum: Secondary | ICD-10-CM | POA: Diagnosis not present

## 2016-07-19 DIAGNOSIS — D124 Benign neoplasm of descending colon: Secondary | ICD-10-CM | POA: Diagnosis not present

## 2016-07-19 DIAGNOSIS — K640 First degree hemorrhoids: Secondary | ICD-10-CM

## 2016-07-19 DIAGNOSIS — D123 Benign neoplasm of transverse colon: Secondary | ICD-10-CM | POA: Diagnosis not present

## 2016-07-19 DIAGNOSIS — G4733 Obstructive sleep apnea (adult) (pediatric): Secondary | ICD-10-CM | POA: Diagnosis not present

## 2016-07-19 DIAGNOSIS — F17200 Nicotine dependence, unspecified, uncomplicated: Secondary | ICD-10-CM | POA: Insufficient documentation

## 2016-07-19 HISTORY — PX: COLONOSCOPY: SHX5424

## 2016-07-19 HISTORY — PX: ESOPHAGOGASTRODUODENOSCOPY: SHX5428

## 2016-07-19 SURGERY — COLONOSCOPY
Anesthesia: Monitor Anesthesia Care

## 2016-07-19 SURGERY — Surgical Case
Anesthesia: *Unknown

## 2016-07-19 MED ORDER — PROPOFOL 10 MG/ML IV BOLUS
INTRAVENOUS | Status: AC
  Filled 2016-07-19: qty 20

## 2016-07-19 MED ORDER — PROPOFOL 10 MG/ML IV BOLUS
INTRAVENOUS | Status: DC | PRN
  Administered 2016-07-19: 20 mg via INTRAVENOUS
  Administered 2016-07-19 (×2): 40 mg via INTRAVENOUS
  Administered 2016-07-19: 50 mg via INTRAVENOUS
  Administered 2016-07-19 (×2): 40 mg via INTRAVENOUS
  Administered 2016-07-19 (×2): 20 mg via INTRAVENOUS
  Administered 2016-07-19: 40 mg via INTRAVENOUS
  Administered 2016-07-19: 30 mg via INTRAVENOUS
  Administered 2016-07-19: 40 mg via INTRAVENOUS
  Administered 2016-07-19: 20 mg via INTRAVENOUS
  Administered 2016-07-19: 40 mg via INTRAVENOUS

## 2016-07-19 MED ORDER — PROPOFOL 10 MG/ML IV BOLUS
INTRAVENOUS | Status: AC
  Filled 2016-07-19: qty 40

## 2016-07-19 MED ORDER — PROPOFOL 500 MG/50ML IV EMUL
INTRAVENOUS | Status: DC | PRN
  Administered 2016-07-19: 75 ug/kg/min via INTRAVENOUS

## 2016-07-19 MED ORDER — RIVAROXABAN 20 MG PO TABS: 20.0000 mg | ORAL_TABLET | Freq: Every day | ORAL | 2 refills | Status: DC

## 2016-07-19 MED ORDER — LACTATED RINGERS IV SOLN
INTRAVENOUS | Status: DC | PRN
  Administered 2016-07-19: 08:00:00 via INTRAVENOUS

## 2016-07-19 NOTE — Anesthesia Postprocedure Evaluation (Signed)
Anesthesia Post Note  Patient: Frank Morales  Procedure(s) Performed: Procedure(s) (LRB): COLONOSCOPY (N/A) ESOPHAGOGASTRODUODENOSCOPY (EGD) (N/A)  Patient location during evaluation: PACU Anesthesia Type: MAC Level of consciousness: awake and alert Pain management: pain level controlled Vital Signs Assessment: post-procedure vital signs reviewed and stable Respiratory status: spontaneous breathing, nonlabored ventilation, respiratory function stable and patient connected to nasal cannula oxygen Cardiovascular status: stable and blood pressure returned to baseline Anesthetic complications: no       Last Vitals:  Vitals:   07/19/16 0945 07/19/16 0950  BP: (!) 176/98 (!) 150/86  Pulse: 86 85  Resp: 18 19  Temp:      Last Pain:  Vitals:   07/19/16 0928  TempSrc: Oral                 [name]

## 2016-07-19 NOTE — H&P (Signed)
History:  This patient presents for endoscopic testing for rectal bleeding, HCV and variceal screening.  Frank Morales Referring physician: Dellia Nims, MD  Past Medical History: Past Medical History:  Diagnosis Date  . Atrial fibrillation and flutter ([place]) 08/17/2015   A. S/p failed DCCV // b. Severe BAE on echo >> rate control strategy (has seen AF clinic) // c. Xarelto for anticoag (CHADS2-VASc=2 / CHF, HTN)  . Chronic diastolic CHF (congestive heart failure) ([place]) 08/30/2015   A. Echo 6/17: Apical HK, moderate focal basal and mild concentric LVH, EF 50-55, diffuse HK, trivial MR, severe BAE, mild TR, PASP 37  . Hepatitis C, chronic ([place]) 08/19/2015  . History of cardiac catheterization    a. LHC 6/17: LAD irregs, o/w no CAD  . OSA (obstructive sleep apnea) 11/21/2015  . Sleep apnea   . Tobacco abuse      Past Surgical History: Past Surgical History:  Procedure Laterality Date  . CARDIAC CATHETERIZATION N/A 08/21/2015   Procedure: Right/Left Heart Cath and Coronary Angiography;  Surgeon: Leonie Man, MD;  Location: [facility];  Service: Cardiovascular;  Laterality: N/A;  . CARDIOVERSION N/A 09/22/2015   Procedure: CARDIOVERSION;  Surgeon: Josue Hector, MD;  Location: [facility] ENDOSCOPY;  Service: Cardiovascular;  Laterality: N/A;  . NO PAST SURGERIES      Allergies: Allergies  Allergen Reactions  . Lactose Intolerance (Gi) Nausea And Vomiting    Outpatient Meds: No current facility-administered medications for this encounter.       ___________________________________________________________________ Objective   Exam:  BP (!) 146/96 Comment: tried different cuff  Pulse 83   Temp 97.8 F (36.6 C) (Oral)   Resp 18   Ht 5\' 11"  (1.803 m)   Wt (!) 347 lb (157.4 kg)   SpO2 96%   BMI 48.40 kg/m    CV: irregular without murmur, S1/S2, no JVD, no peripheral edema  Resp: clear to auscultation bilaterally, normal RR and effort noted  GI: soft, no tenderness,  with active bowel sounds. No guarding or palpable organomegaly noted.  Neuro: awake, alert and oriented x 3. Normal gross motor function and fluent speech Morbidly obese  Assessment:  Rectal bleeding, HCV and low platelets (?cirrhosis) - screen for esophageal varices  Plan:  EGD/colonoscopy   [name]

## 2016-07-19 NOTE — Op Note (Signed)
[facility] Patient Name: Frank Morales Procedure Date: 07/19/2016 MRN: [id number] Attending MD: Estill Cotta. [name] , MD Date of Birth: [birth year] CSN: [id number] Age: 56 Admit Type: Outpatient Procedure:                Colonoscopy Indications:              Rectal bleeding Providers:                Mallie Mussel L. [name], MD, Carolynn Comment, RN, Nevin Bloodgood, Technician, Anne Fu CRNA, CRNA Referring MD:              Medicines:                Monitored Anesthesia Care Complications:            No immediate complications. Estimated Blood Loss:     Estimated blood loss was minimal. Procedure:                Pre-Anesthesia Assessment:                           - Prior to the procedure, a History and Physical                            was performed, and patient medications and                            allergies were reviewed. The patient's tolerance of                            previous anesthesia was also reviewed. The risks                            and benefits of the procedure and the sedation                            options and risks were discussed with the patient.                            All questions were answered, and informed consent                            was obtained. Prior Anticoagulants: The patient has                            taken Xarelto (rivaroxaban), last dose was 2 days                            prior to procedure. ASA Grade Assessment: III - A                            patient with severe systemic disease. After  reviewing the risks and benefits, the patient was                            deemed in satisfactory condition to undergo the                            procedure.                           After obtaining informed consent, the colonoscope                            was passed under direct vision. Throughout the                            procedure, the patient's blood  pressure, pulse, and                            oxygen saturations were monitored continuously. The                            EC-[id number] ([id number]) scope was introduced through                            the anus and advanced to the the cecum, identified                            by appendiceal orifice and ileocecal valve. The                            colonoscopy was technically difficult and complex                            due to significant looping and the patient's body                            habitus. The patient tolerated the procedure fairly                            well. The quality of the bowel preparation was                            evaluated using the BBPS [facility] Bowel Preparation                            Scale) with scores of: Right Colon = 2, Transverse                            Colon = 2 and Left Colon = 2. The total BBPS score                            equals 6. The bowel preparation used was SUPREP.  The ileocecal valve, appendiceal orifice, and                            rectum were photographed. Scope In: 8:31:59 AM Scope Out: 9:19:11 AM Scope Withdrawal Time: 0 hours 43 minutes 8 seconds  Total Procedure Duration: 0 hours 47 minutes 12 seconds  Findings:      The perianal and digital rectal examinations were normal.      A few small-mouthed diverticula were found in the left colon.      Internal hemorrhoids were found during anoscopy. The hemorrhoids were       small and Grade I (internal hemorrhoids that do not prolapse).      A 8 mm polyp was found in the proximal ascending colon. The polyp was       sessile. The polyp was removed with a cold snare. Resection and       retrieval were complete. To prevent bleeding post-intervention, one       hemostatic clip was successfully placed (MR conditional). There was no       bleeding during the procedure.      A 20 mm polyp was found in the proximal transverse colon. The polyp was        sessile. The polyp was removed with a hot snare. Resection and retrieval       were complete. To prevent bleeding after the polypectomy, three       hemostatic clips were successfully placed (MR conditional). There was no       bleeding during the procedure.      A 10 mm polyp was found in the proximal descending colon. The polyp was       semi-pedunculated. The polyp was removed with a hot snare. Resection and       retrieval were complete. To prevent bleeding after the polypectomy, one       hemostatic clip was successfully placed (MR conditional). There was no       bleeding during the procedure.      The exam was otherwise without abnormality on direct and retroflexion       views. Impression:               - Diverticulosis in the left colon.                           - Internal hemorrhoids.                           - One 8 mm polyp in the proximal ascending colon,                            removed with a cold snare. Resected and retrieved.                            Clip (MR conditional) was placed.                           - One 20 mm polyp in the proximal transverse colon,                            removed with a hot snare. Resected  and retrieved.                            Clips (MR conditional) were placed.                           - One 10 mm polyp in the proximal descending colon,                            removed with a hot snare. Resected and retrieved.                            Clip (MR conditional) was placed.                           - The examination was otherwise normal on direct                            and retroflexion views. Moderate Sedation:      MAC sedation used Recommendation:           - Patient has a contact number available for                            emergencies. The signs and symptoms of potential                            delayed complications were discussed with the                            patient. Return to normal activities  tomorrow.                            Written discharge instructions were provided to the                            patient.                           - Resume previous diet.                           - Resume Xarelto (rivaroxaban) at prior dose in 5                            days.                           - Await pathology results.                           - Repeat colonoscopy is recommended for                            surveillance. The colonoscopy date will be  determined after pathology results from today's                            exam become available for review.                           Alternatively, Cologuard could be considered for                            future screening in the patient, given the risk and                            technical challenges of colonoscopy. Procedure Code(s):        --- Professional ---                           [phone], Colonoscopy, flexible; with removal of                            tumor(s), polyp(s), or other lesion(s) by snare                            technique Diagnosis Code(s):        --- Professional ---                           K64.0, First degree hemorrhoids                           D12.2, Benign neoplasm of ascending colon                           D12.3, Benign neoplasm of transverse colon (hepatic                            flexure or splenic flexure)                           D12.4, Benign neoplasm of descending colon                           K62.5, Hemorrhage of anus and rectum                           K57.30, Diverticulosis of large intestine without                            perforation or abscess without bleeding CPT copyright 2016 American Medical Association. All rights reserved. The codes documented in this report are preliminary and upon coder review may  be revised to meet current compliance requirements. Tekoa Amon L. [name], MD 07/19/2016 9:36:56 AM This report has been signed  electronically. Number of Addenda: 0

## 2016-07-19 NOTE — Discharge Instructions (Signed)
Resume your Xarelto (rivaroxoban) at your usual dose on 07/24/16.  We will send you a letter regarding your polyp results.

## 2016-07-19 NOTE — Interval H&P Note (Signed)
History and Physical Interval Note:  07/19/2016 8:11 AM  Frank Morales  has presented today for surgery, with the diagnosis of rectal bleeding, liver fibrosis, low platelets, screen for esophageal varices  The various methods of treatment have been discussed with the patient and family. After consideration of risks, benefits and other options for treatment, the patient has consented to  Procedure(s): COLONOSCOPY (N/A) ESOPHAGOGASTRODUODENOSCOPY (EGD) (N/A) as a surgical intervention .  The patient's history has been reviewed, patient examined, no change in status, stable for surgery.  I have reviewed the patient's chart and labs.  Questions were answered to the patient's satisfaction.     [name]

## 2016-07-19 NOTE — Op Note (Signed)
[facility] Patient Name: Frank Morales Procedure Date: 07/19/2016 MRN: [id number] Attending MD: Estill Cotta. [name] , MD Date of Birth: [birth year] CSN: [id number] Age: 56 Admit Type: Outpatient Procedure:                Upper GI endoscopy Indications:              Hepatitis rule out esophageal varices (fibrosis on                            elastography, low platelets) Providers:                Estill Cotta. [name], MD, Carolynn Comment, RN, Nevin Bloodgood, Technician, Delena Bali CRNA, CRNA Referring MD:              Medicines:                Monitored Anesthesia Care Complications:            No immediate complications. Estimated Blood Loss:     Estimated blood loss: none. Procedure:                Pre-Anesthesia Assessment:                           - Prior to the procedure, a History and Physical                            was performed, and patient medications and                            allergies were reviewed. The patient's tolerance of                            previous anesthesia was also reviewed. The risks                            and benefits of the procedure and the sedation                            options and risks were discussed with the patient.                            All questions were answered, and informed consent                            was obtained. Prior Anticoagulants: The patient has                            taken Xarelto (rivaroxaban), last dose was 2 days                            prior to procedure. ASA Grade Assessment: III - A  patient with severe systemic disease. After                            reviewing the risks and benefits, the patient was                            deemed in satisfactory condition to undergo the                            procedure.                           After obtaining informed consent, the endoscope was                            passed under  direct vision. Throughout the                            procedure, the patient's blood pressure, pulse, and                            oxygen saturations were monitored continuously. The                            [id number] ([id number]) scope was introduced through the                            mouth, and advanced to the second part of duodenum.                            The upper GI endoscopy was accomplished without                            difficulty. The patient tolerated the procedure                            well. Scope In: Scope Out: Findings:      The esophagus was normal. No varices were seen.      The stomach was normal. No varices were seen.      The cardia and gastric fundus were normal on retroflexion.      The examined duodenum was normal. Impression:               - Normal esophagus.                           - Normal stomach.                           - Normal examined duodenum.                           - No specimens collected.                           No UGI evidence of portal hypertension. Moderate Sedation:  MAC sedation used Recommendation:           - Patient has a contact number available for                            emergencies. The signs and symptoms of potential                            delayed complications were discussed with the                            patient. Return to normal activities tomorrow.                            Written discharge instructions were provided to the                            patient.                           - Resume previous diet.                           - Continue present medications.                           - See the other procedure note for documentation of                            additional recommendations.                           No need for repeat EGD/variceal screening unless                            patient develops evidence of portal hypertension. 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 All rights reserved. The codes documented in this report are preliminary and upon coder review may  be revised to meet current compliance requirements. Henry L. [name], MD 07/19/2016 9:29:15 AM This report has been signed electronically. Number of Addenda:  0 

## 2016-07-19 NOTE — Transfer of Care (Signed)
Immediate Anesthesia Transfer of Care Note  Patient: Frank Morales  Procedure(s) Performed: Procedure(s): COLONOSCOPY (N/A) ESOPHAGOGASTRODUODENOSCOPY (EGD) (N/A)  Patient Location: PACU  Anesthesia Type:MAC  Level of Consciousness:  sedated, patient cooperative and responds to stimulation  Airway & Oxygen Therapy:Patient Spontanous Breathing and Patient connected to face mask oxgen  Post-op Assessment:  Report given to PACU RN and Post -op Vital signs reviewed and stable  Post vital signs:  Reviewed and stable  Last Vitals:  Vitals:   07/19/16 0744 07/19/16 0928  BP: (!) 146/96   Pulse:  (!) 26  Resp:  13  Temp:  56.3 C    Complications: No apparent anesthesia complications

## 2016-07-22 ENCOUNTER — Encounter (HOSPITAL_COMMUNITY): Payer: Self-pay | Admitting: Gastroenterology

## 2016-07-23 ENCOUNTER — Encounter: Payer: Self-pay | Admitting: Gastroenterology

## 2016-08-04 NOTE — Progress Notes (Signed)
Cardiology Office Note   Date:  08/05/2016   ID:  Frank Morales, DOB 1960/10/17, MRN [id number]  PCP:  Dellia Nims, MD  Cardiologist:   Dorris Carnes, MD   F/U of atrial flutter and diastolic CHF      History of Present Illness: Frank Morales is a 56 y.o. male with a history of atrial flutter, diastolic CHF, OSA, substance abuse.  Cath in June 2017  No signif CAD  S/P cardioversion in July 2017   I saw him in Feb 2018  He was back in atrial flutter  Plan for rate control Vol was up       Since seen still had SOB at night when lays down  With weather can get bad  Sinus Legs ok in am  Go up in afternoon    Occasionaldizziness  MIld  Watching salt he says      Current Meds  Medication Sig  . diltiazem (CARTIA XT) 240 MG 24 hr capsule Take 1 capsule (240 mg total) by mouth daily.  . fluticasone (FLONASE) 50 MCG/ACT nasal spray Place 1 spray into both nostrils daily. (Patient taking differently: Place 1 spray into both nostrils daily as needed for allergies. )  . furosemide (LASIX) 40 MG tablet Take 2 tablets (80 mg total) by mouth 2 (two) times daily.  . Na Sulfate-K Sulfate-Mg Sulf 17.5-3.13-1.6 GM/180ML SOLN Suprep (no substitutions)-TAKE AS DIRECTED.  . rivaroxaban (XARELTO) 20 MG TABS tablet Take 1 tablet (20 mg total) by mouth daily with supper.     Allergies:   Lactose intolerance (gi)   Past Medical History:  Diagnosis Date  . Atrial fibrillation and flutter ([place]) 08/17/2015   A. S/p failed DCCV // b. Severe BAE on echo >> rate control strategy (has seen AF clinic) // c. Xarelto for anticoag (CHADS2-VASc=2 / CHF, HTN)  . Chronic diastolic CHF (congestive heart failure) ([place]) 08/30/2015   A. Echo 6/17: Apical HK, moderate focal basal and mild concentric LVH, EF 50-55, diffuse HK, trivial MR, severe BAE, mild TR, PASP 37  . Hepatitis C, chronic ([place]) 08/19/2015  . History of cardiac catheterization    a. LHC 6/17: LAD irregs, o/w no CAD  . OSA (obstructive sleep  apnea) 11/21/2015  . Sleep apnea   . Tobacco abuse     Past Surgical History:  Procedure Laterality Date  . CARDIAC CATHETERIZATION N/A 08/21/2015   Procedure: Right/Left Heart Cath and Coronary Angiography;  Surgeon: Leonie Man, MD;  Location: [facility];  Service: Cardiovascular;  Laterality: N/A;  . CARDIOVERSION N/A 09/22/2015   Procedure: CARDIOVERSION;  Surgeon: Josue Hector, MD;  Location: [facility] ENDOSCOPY;  Service: Cardiovascular;  Laterality: N/A;  . COLONOSCOPY N/A 07/19/2016   Procedure: COLONOSCOPY;  Surgeon: Doran Stabler, MD;  Location: [facility] ENDOSCOPY;  Service: Gastroenterology;  Laterality: N/A;  . ESOPHAGOGASTRODUODENOSCOPY N/A 07/19/2016   Procedure: ESOPHAGOGASTRODUODENOSCOPY (EGD);  Surgeon: Doran Stabler, MD;  Location: [facility];  Service: Gastroenterology;  Laterality: N/A;  . NO PAST SURGERIES       Social History:  The patient  reports that he has been smoking Cigarettes.  He has been smoking about 0.30 packs per day. He has never used smokeless tobacco. He reports that he drinks about 3.0 oz of alcohol per week . He reports that he does not use drugs.   Family History:  The patient's family history includes Diabetes in his mother; Hypertension in his maternal grandfather and mother; Pneumonia  in his father.    ROS:  Please see the history of present illness. All other systems are reviewed and  Negative to the above problem except as noted.    PHYSICAL EXAM: VS:  BP 138/86   Pulse 84   Ht 5\' 11"  (1.803 m)   Wt (!) 328 lb (148.8 kg)   SpO2 96%   BMI 45.75 kg/m   GEN: Morbidly obese 56 yo  in no acute distress  HEENT: normal  Neck: no JVD, carotid bruits, or masses  Neck full   Cardiac: RRR; no murmurs, rubs, or gallops,2+ edema  Respiratory:  clear to auscultation bilaterally, normal work of breathing GI: soft, nontender, nondistended, + BS  No hepatomegaly  MS: no deformity Moving all extremities   Skin: warm and dry, no rash Neuro:   Strength and sensation are intact Psych: euthymic mood, full affect   EKG:  EKG is not  ordered today.   Lipid Panel    Component Value Date/Time   CHOL 105 08/17/2015 1233   TRIG 182 (H) 08/17/2015 1233   HDL 28 (L) 08/17/2015 1233   CHOLHDL 3.8 08/17/2015 1233   VLDL 36 08/17/2015 1233   LDLCALC 41 08/17/2015 1233      Wt Readings from Last 3 Encounters:  08/05/16 (!) 328 lb (148.8 kg)  07/19/16 (!) 347 lb (157.4 kg)  07/08/16 (!) 347 lb (157.4 kg)      ASSESSMENT AND PLAN:  1  Atrail flutter  Continue rate control and anticoagulation    2  Chronic diastlic CHF  Volume is up WIll get CBC, BMET and BNP     May explain coughing when lies back    3  Sleep apnea  PCP organize       Current medicines are reviewed at length with the patient today.  The patient does not have concerns regarding medicines.  Signed, Dorris Carnes, MD  08/05/2016 10:32 AM    [address], [place], [place]  [address] Phone: [phone]; Fax: [phone]

## 2016-08-05 ENCOUNTER — Ambulatory Visit (INDEPENDENT_AMBULATORY_CARE_PROVIDER_SITE_OTHER): Payer: Medicaid Other | Admitting: Internal Medicine

## 2016-08-05 ENCOUNTER — Encounter: Payer: Self-pay | Admitting: Internal Medicine

## 2016-08-05 VITALS — BP 138/86 | HR 84 | Ht 71.0 in | Wt 328.0 lb

## 2016-08-05 DIAGNOSIS — I5032 Chronic diastolic (congestive) heart failure: Secondary | ICD-10-CM | POA: Diagnosis not present

## 2016-08-05 DIAGNOSIS — I4892 Unspecified atrial flutter: Secondary | ICD-10-CM

## 2016-08-05 DIAGNOSIS — I4891 Unspecified atrial fibrillation: Secondary | ICD-10-CM | POA: Diagnosis not present

## 2016-08-05 LAB — BASIC METABOLIC PANEL
BUN/Creatinine Ratio: 20 (ref 9–20)
BUN: 20 mg/dL (ref 6–24)
CALCIUM: 9.1 mg/dL (ref 8.7–10.2)
CO2: 23 mmol/L (ref 18–29)
CREATININE: 1 mg/dL (ref 0.76–1.27)
Chloride: 99 mmol/L (ref 96–106)
GFR calc Af Amer: 98 mL/min/{1.73_m2} (ref >59)
GFR, EST NON AFRICAN AMERICAN: 85 mL/min/{1.73_m2} (ref >59)
Glucose: 89 mg/dL (ref 65–99)
Potassium: 4.1 mmol/L (ref 3.5–5.2)
Sodium: 139 mmol/L (ref 134–144)

## 2016-08-05 LAB — CBC
HEMATOCRIT: 43.2 % (ref 37.5–51.0)
HEMOGLOBIN: 15.3 g/dL (ref 13.0–17.7)
MCH: 32.4 pg (ref 26.6–33.0)
MCHC: 35.4 g/dL (ref 31.5–35.7)
MCV: 92 fL (ref 79–97)
Platelets: 141 10*3/uL — ABNORMAL LOW (ref 150–379)
RBC: 4.72 x10E6/uL (ref 4.14–5.80)
RDW: 14.8 % (ref 12.3–15.4)
WBC: 5.4 10*3/uL (ref 3.4–10.8)

## 2016-08-05 LAB — PRO B NATRIURETIC PEPTIDE: NT-PRO BNP: 385 pg/mL — AB (ref 0–121)

## 2016-08-05 MED ORDER — RIVAROXABAN 20 MG PO TABS: 20.0000 mg | ORAL_TABLET | Freq: Every day | ORAL | 3 refills | Status: DC

## 2016-08-05 MED ORDER — FUROSEMIDE 40 MG PO TABS: 80.0000 mg | ORAL_TABLET | Freq: Two times a day (BID) | ORAL | 3 refills | Status: DC

## 2016-08-05 NOTE — Patient Instructions (Signed)
Your physician recommends that you continue on your current medications as directed. Please refer to the Current Medication list given to you today.  Your physician recommends that you return for lab work TODAY (bmet, cbc, bnp)

## 2016-08-19 ENCOUNTER — Ambulatory Visit (INDEPENDENT_AMBULATORY_CARE_PROVIDER_SITE_OTHER): Payer: Medicaid Other | Admitting: Internal Medicine

## 2016-08-19 ENCOUNTER — Encounter: Payer: Self-pay | Admitting: Internal Medicine

## 2016-08-19 VITALS — BP 136/85 | HR 70 | Temp 97.6°F | Ht 71.0 in | Wt 338.4 lb

## 2016-08-19 DIAGNOSIS — K0889 Other specified disorders of teeth and supporting structures: Secondary | ICD-10-CM

## 2016-08-19 DIAGNOSIS — E669 Obesity, unspecified: Secondary | ICD-10-CM

## 2016-08-19 DIAGNOSIS — F17200 Nicotine dependence, unspecified, uncomplicated: Secondary | ICD-10-CM

## 2016-08-19 DIAGNOSIS — G4733 Obstructive sleep apnea (adult) (pediatric): Secondary | ICD-10-CM | POA: Diagnosis not present

## 2016-08-19 DIAGNOSIS — I5032 Chronic diastolic (congestive) heart failure: Secondary | ICD-10-CM | POA: Diagnosis not present

## 2016-08-19 DIAGNOSIS — I110 Hypertensive heart disease with heart failure: Secondary | ICD-10-CM

## 2016-08-19 DIAGNOSIS — G8929 Other chronic pain: Secondary | ICD-10-CM

## 2016-08-19 DIAGNOSIS — F17210 Nicotine dependence, cigarettes, uncomplicated: Secondary | ICD-10-CM

## 2016-08-19 DIAGNOSIS — K089 Disorder of teeth and supporting structures, unspecified: Secondary | ICD-10-CM

## 2016-08-19 DIAGNOSIS — I10 Essential (primary) hypertension: Secondary | ICD-10-CM

## 2016-08-19 MED ORDER — NICOTINE POLACRILEX 2 MG MT GUM: 2.0000 mg | CHEWING_GUM | OROMUCOSAL | 0 refills | Status: DC | PRN

## 2016-08-19 NOTE — Progress Notes (Signed)
   CC: HTN/CHF follow up   HPI:  [name] is a 56 y.o. with pmh as listed below is here for HTN and CHF f/up   Past Medical History:  Diagnosis Date  . Atrial fibrillation and flutter (Frank Morales) 08/17/2015   A. S/p failed DCCV // b. Severe BAE on echo >> rate control strategy (has seen AF clinic) // c. Xarelto for anticoag (CHADS2-VASc=2 / CHF, HTN)  . Chronic diastolic CHF (congestive heart failure) ([place]) 08/30/2015   A. Echo 6/17: Apical HK, moderate focal basal and mild concentric LVH, EF 50-55, diffuse HK, trivial MR, severe BAE, mild TR, PASP 37  . Hepatitis C, chronic ([place]) 08/19/2015  . History of cardiac catheterization    a. LHC 6/17: LAD irregs, o/w no CAD  . OSA (obstructive sleep apnea) 11/21/2015  . Sleep apnea   . Tobacco abuse    Saw cardiologist on 08/05/16, doing well, no medication changes. He is on dilt for afib, well controlled on this. On xarelto for anticoag. Taking lasix 40 mg 2 tabs twice daily. Breathing fine, gets SOB with long distance walking but that's improving. Sleeping on his stomach. No chest pain.  Sleep apnea - had sleep study, but could not get the machine due to lack of insurance.   Having dental pain. No fevers. Had problem with his teeth for several months. No gum bleeding.   Had colonoscopy on 5/18, had tubular adenomas removed x3, has f/up in 3 years.  Stopped cocaine use 6 months ago. Smoking 2-3 cigs a day.  Review of Systems:   ROS  Physical Exam:  Vitals:   08/19/16 1426  BP: 136/85  Pulse: 70  Temp: 97.6 F (36.4 C)  TempSrc: Oral  SpO2: 98%  Weight: (!) 338 lb 6.4 oz (153.5 kg)  Height: 5\' 11"  (1.803 m)   Physical Exam  Constitutional: He is oriented to person, place, and time. He appears well-developed and well-nourished.  Obese male   HENT:  Mouth/Throat: Abnormal dentition.  Has pain over the left upper gum area. No abscess, bleeding, discharge, or ulcer. Poor dentition, has dental caries.   Cardiovascular: Normal  rate and regular rhythm.  Exam reveals no gallop and no friction rub.   No murmur heard. Respiratory: Effort normal and breath sounds normal. No respiratory distress. He has no wheezes.  GI: Soft. Bowel sounds are normal. There is no tenderness.  obese  Musculoskeletal: Normal range of motion.  2+ pitting edema on both legs, chronic venous stasis skin changes.   Neurological: He is alert and oriented to person, place, and time.    Assessment & Plan:   See Encounters Tab for problem based charting.  Patient discussed with Dr. Dareen Piano

## 2016-08-19 NOTE — Assessment & Plan Note (Signed)
Doing well, is able to walk longer distance without getting SOB. Has 2+ pitting edema but overall volume status is ok, normal lung sounds. Cont lasix 80 mg bid Asked to wear compression socks daily.

## 2016-08-19 NOTE — Assessment & Plan Note (Signed)
Had sleep study done but did not have cpap due to lack of insurance. Now has medicaid so I ordered CPAP.

## 2016-08-19 NOTE — Assessment & Plan Note (Signed)
Vitals:   08/19/16 1426  BP: 136/85  Pulse: 70  Temp: 97.6 F (36.4 C)   BP well controlled on dilt, cont this.

## 2016-08-19 NOTE — Assessment & Plan Note (Signed)
Having dental pain, no abscess, has dental caries. Gave him list of dentists accepting medicaid.

## 2016-08-19 NOTE — Assessment & Plan Note (Addendum)
Smoking 2-3 cigs. Interested in nicotine gums, ordered it.

## 2016-08-19 NOTE — Patient Instructions (Signed)
Continue your current medications.  Ordered nicotine gum. Continue to try to stop smoking.  Ordered CPAP machine.  Follow up in 3 months.   Follow up with the dentist.

## 2016-08-20 ENCOUNTER — Telehealth: Payer: Self-pay | Admitting: Internal Medicine

## 2016-08-20 NOTE — Progress Notes (Signed)
Internal Medicine Clinic Attending  Case discussed with Dr. Ahmed at the time of the visit.  We reviewed the resident's history and exam and pertinent patient test results.  I agree with the assessment, diagnosis, and plan of care documented in the resident's note. 

## 2016-08-20 NOTE — Telephone Encounter (Signed)
CALLED PATIENT, LMTCB, IT IS TIME TO RENEW [facility] CARD °

## 2016-08-23 ENCOUNTER — Encounter: Payer: Self-pay | Admitting: *Deleted

## 2016-08-27 ENCOUNTER — Other Ambulatory Visit: Payer: Self-pay | Admitting: Pharmacist

## 2016-08-27 DIAGNOSIS — I10 Essential (primary) hypertension: Secondary | ICD-10-CM

## 2016-08-27 MED ORDER — DILTIAZEM HCL ER COATED BEADS 240 MG PO CP24: 240.0000 mg | ORAL_CAPSULE | Freq: Every day | ORAL | 2 refills | Status: DC

## 2016-08-27 MED FILL — CARTIA XT 240 MG CAPSULE: 240 | 30 days supply | Qty: 30 | Fill #0 | Status: TO

## 2016-09-02 ENCOUNTER — Ambulatory Visit: Payer: Medicaid Other | Admitting: Pharmacist

## 2016-09-02 ENCOUNTER — Ambulatory Visit (INDEPENDENT_AMBULATORY_CARE_PROVIDER_SITE_OTHER): Payer: Medicaid Other | Admitting: Pharmacist

## 2016-09-02 ENCOUNTER — Other Ambulatory Visit: Payer: Medicaid Other

## 2016-09-02 DIAGNOSIS — Z79899 Other long term (current) drug therapy: Secondary | ICD-10-CM

## 2016-09-02 DIAGNOSIS — Z716 Tobacco abuse counseling: Secondary | ICD-10-CM | POA: Diagnosis not present

## 2016-09-02 DIAGNOSIS — F17210 Nicotine dependence, cigarettes, uncomplicated: Secondary | ICD-10-CM | POA: Diagnosis not present

## 2016-09-02 DIAGNOSIS — Z719 Counseling, unspecified: Secondary | ICD-10-CM

## 2016-09-02 DIAGNOSIS — B182 Chronic viral hepatitis C: Secondary | ICD-10-CM

## 2016-09-02 NOTE — Patient Instructions (Signed)
Congratulations on your decision to quit smoking! This is a great decision for your health. Please start with nicotine 14 mg patches as reviewed. Once you finish the 14 mg patches, switch to 7 mg patches. For cravings, please chew and park 2 mg nicotine gum as reviewed.  Please contact clinic if any questions or concerns arise.

## 2016-09-02 NOTE — Progress Notes (Addendum)
Patient was reviewed by clinical pharmacist for assistance with tobacco cessation. Pt reports smoking 1 pack per week and 2-3 per day. Pt was interested in starting nicotine gum and/or patches.   A/P: Nicotine dependence: Mild. Pt is a good candidate for success as he is motivated to quit. Pt was given samples of Nicotine Gum 2 mg, Nicotine Patch 14mg  and Nicotine Patch 7mg  for step-down therapy. Plan to use 14 mg patches + gum for 2 weeks then switch to 7mg  patches + gum for the following two weeks. Pt was instructed to contact clinic if the 7mg  patch + gum does not cover cravings after treatment step-down from 14mg  patch + gum. Treatment was reviewed with the patient, including name, instructions, goals of therapy, potential side effects, importance of adherence, and safe use (max dose).  Reviewed potential challenges and coping skills/strategies with patient. Pt was given schedule for [facility] smoking cessation classes. Provided information on 1 800-QUIT NOW support program and advised patient to contact me if questions/concerns arise. Patient verbalized understanding of information by repeating back.  Pt will be contacted for follow up next week.  Maryan Char, PharmD Candidate

## 2016-09-05 LAB — HEPATITIS C RNA QUANTITATIVE
HCV QUANT: NOT DETECTED [IU]/mL
HCV Quantitative Log: <1.18 Log IU/mL

## 2016-09-09 ENCOUNTER — Ambulatory Visit (INDEPENDENT_AMBULATORY_CARE_PROVIDER_SITE_OTHER): Payer: Medicaid Other | Admitting: Pharmacist

## 2016-09-09 DIAGNOSIS — B182 Chronic viral hepatitis C: Secondary | ICD-10-CM

## 2016-09-09 NOTE — Progress Notes (Signed)
HPI: Frank Morales is a 56 y.o. male who presents to the [facility] for his Hep C cure visit.   No results found for: HCVGENOTYPE, HEPCGENOTYPE  Allergies: Allergies  Allergen Reactions  . Lactose Intolerance (Gi) Nausea And Vomiting    Past Medical History: Past Medical History:  Diagnosis Date  . Atrial fibrillation and flutter ([place]) 08/17/2015   A. S/p failed DCCV // b. Severe BAE on echo >> rate control strategy (has seen AF clinic) // c. Xarelto for anticoag (CHADS2-VASc=2 / CHF, HTN)  . Chronic diastolic CHF (congestive heart failure) ([place]) 08/30/2015   A. Echo 6/17: Apical HK, moderate focal basal and mild concentric LVH, EF 50-55, diffuse HK, trivial MR, severe BAE, mild TR, PASP 37  . Hepatitis C, chronic ([place]) 08/19/2015  . History of cardiac catheterization    a. LHC 6/17: LAD irregs, o/w no CAD  . OSA (obstructive sleep apnea) 11/21/2015  . Sleep apnea   . Tobacco abuse     Social History: Social History   Social History  . Marital status: Single    Spouse name: N/A  . Number of children: N/A  . Years of education: N/A   Occupational History  . unemployed     Previous Training and development officer    Social History Main Topics  . Smoking status: Current Every Day Smoker    Packs/day: 0.30    Types: Cigarettes  . Smokeless tobacco: Never Used     Comment: 3 -4 cigs/day.  . Alcohol use 3.0 oz/week    5 Standard drinks or equivalent per week     Comment: 2 times a week beer  . Drug use: No  . Sexual activity: Not on file   Other Topics Concern  . Not on file   Social History Narrative  . No narrative on file    Labs: Hepatitis B Surface Ag (no units)  Date Value  08/17/2015 Negative   HCV Ab (s/co ratio)  Date Value  08/17/2015 >11.0 (H)    No results found for: HCVGENOTYPE, HEPCGENOTYPE  Hepatitis C RNA quantitative Latest Ref Rng & Units 09/02/2016 02/15/2016 11/09/2015 08/19/2015  HCV Quantitative NOT DETECTED IU/mL <15 NOT DETECTED Not Detected <15 508,000   HCV Quantitative Log NOT DETECTED Log IU/mL <1.18 NOT DETECTED NOT CALC <1.18 5.706    AST (U/L)  Date Value  08/19/2015 73 (H)  08/17/2015 63 (H)   ALT (U/L)  Date Value  08/19/2015 70 (H)  08/17/2015 65 (H)   INR (no units)  Date Value  08/21/2015 1.12    CrCl: CrCl cannot be calculated (Patient's most recent lab result is older than the maximum 21 days allowed.).  Fibrosis Score: F3/F4 as assessed by elastography   Child-Pugh Score: A  Previous Treatment Regimen: None  Assessment: Frank Morales is here today to follow-up for his Hep C.  He finished 16 months of Zepatier + RBV (due to baseline resistance) back in December.  He followed-up with [name] last December at the end of therapy and he discussed following up with GI in the future.  His SVR24 Hep C viral load was undetectable when checked last week, so he is cured.  Congratulated him and told him to let us know if we can do anything for him in the future.    Plans: - Cured of Hep C - RTC PRN  Archana Eckman L. Samadhi Mahurin, PharmD, [facility] 09/09/2016, 4:19 PM

## 2016-09-19 NOTE — Progress Notes (Signed)
Patient was seen in clinic with [name], PharmD candidate. I agree with the assessment and plan of care documented.  

## 2016-09-27 NOTE — Anesthesia Postprocedure Evaluation (Signed)
Anesthesia Post Note  Patient: Frank Morales  Procedure(s) Performed: Procedure(s) (LRB): COLONOSCOPY (N/A) ESOPHAGOGASTRODUODENOSCOPY (EGD) (N/A)     Anesthesia Post Evaluation  Last Vitals:  Vitals:   07/19/16 0945 07/19/16 0950  BP: (!) 176/98 (!) 150/86  Pulse: 86 85  Resp: 18 19  Temp:      Last Pain:  Vitals:   07/22/16 1543  TempSrc:   PainSc: 0-No pain                 Reanna Scoggin EDWARD

## 2016-09-27 NOTE — Addendum Note (Signed)
Addendum  created 09/27/16 1216 by Lyndle Herrlich, MD   Sign clinical note

## 2016-12-16 ENCOUNTER — Other Ambulatory Visit: Payer: Self-pay | Admitting: Pharmacist

## 2016-12-17 ENCOUNTER — Telehealth: Payer: Self-pay | Admitting: Internal Medicine

## 2016-12-17 NOTE — Telephone Encounter (Signed)
Patient called to schedule follow-up with Dr. Harrington Challenger. He has not been evaluated since May, but no follow-up was arranged. He states he is SOB, but it is "no worse than usual or has been for years." He understands Dr. Harrington Challenger' nurse will call to arrange appropriate follow-up.

## 2016-12-17 NOTE — Telephone Encounter (Signed)
Pt c/o swelling: STAT is pt has developed SOB within 24 hours  How much weight have you gained and in what time span? unknown If swelling, where is the swelling located? Both legs 1) Are you currently taking a fluid pill? yes  Are you currently SOB? yes Do you have a log of your daily weights (if so, list)? unknown Have you gained 3 pounds in a day or 5 pounds in a week? Unknown  2) Have you traveled recently? no

## 2016-12-23 NOTE — Telephone Encounter (Signed)
Patient last seen May 2018 with Dr. Harrington Challenger.  SOB present and about same as it was at that visit. Leg swelling-was doing pretty good but lately swelling again. Has all medications; does not need refills. Scheduled with Vin for 10/25, 8:00 am. Asked him to call back if anything (swelling, breathing) gets worse.  He is in agreement with this plan.

## 2017-01-06 NOTE — Progress Notes (Signed)
Cardiology Office Note    Date:  01/07/2017   ID:  COMPTON BRIGANCE, DOB Aug 23, 1960, MRN [id number]  PCP:  Thomasene Ripple, MD  Cardiologist:  Dr. Harrington Challenger  Chief Complaint: LE edema and SOB  History of Present Illness:   Frank Morales is a 56 y.o. male chronic AF/flutter, diastolic CHF, OSA working to get CPAP, obesity, substance abuse, HCV presents for SOB and LE edema.   He was admitted in 6/17 with newly diagnosed CHF and atrial flutter. Cardiac catheterization that time demonstrated no significant CAD. Echocardiogram demonstrated EF 50-55%. He is on Xarelto for anticoagulation. He underwent DCCV in 7/17 but reverted back. It was felt that rate control strategies would be best for him as restoring to SR is very low at this point due to severely  dilated left atrium and morbid obesity.  Last seen by Dr. Harrington Challenger 5/18. Had LE edema and SOB. BNP minimally elevated. Advised to cut back on salt.   Recent here for ongoing symptoms of lower extremity edema and shortness of breath. This been chronic and stable. He has dyspnea on exertion. Has a cough with congestion. He continues to smoke. Eats sodium rich diet.  He denies orthopnea, PND, syncope, dizziness or melena.  Past Medical History:  Diagnosis Date  . Atrial fibrillation and flutter ([place]) 08/17/2015   A. S/p failed DCCV // b. Severe BAE on echo >> rate control strategy (has seen AF clinic) // c. Xarelto for anticoag (CHADS2-VASc=2 / CHF, HTN)  . Chronic diastolic CHF (congestive heart failure) ([place]) 08/30/2015   A. Echo 6/17: Apical HK, moderate focal basal and mild concentric LVH, EF 50-55, diffuse HK, trivial MR, severe BAE, mild TR, PASP 37  . Hepatitis C, chronic ([place]) 08/19/2015  . History of cardiac catheterization    a. LHC 6/17: LAD irregs, o/w no CAD  . OSA (obstructive sleep apnea) 11/21/2015  . Sleep apnea   . Tobacco abuse     Past Surgical History:  Procedure Laterality Date  . CARDIAC CATHETERIZATION N/A 08/21/2015   Procedure: Right/Left Heart Cath and Coronary Angiography;  Surgeon: Leonie Man, MD;  Location: [facility];  Service: Cardiovascular;  Laterality: N/A;  . CARDIOVERSION N/A 09/22/2015   Procedure: CARDIOVERSION;  Surgeon: Josue Hector, MD;  Location[facility];  Service: Cardiovascular;  Laterality: N/A;  . COLONOSCOPY N/A 07/19/2016   Procedure: COLONOSCOPY;  Surgeon: Doran Stabler, MD;  Location: [facility] ENDOSCOPY;  Service: Gastroenterology;  Laterality: N/A;  . ESOPHAGOGASTRODUODENOSCOPY N/A 07/19/2016   Procedure: ESOPHAGOGASTRODUODENOSCOPY (EGD);  Surgeon: Doran Stabler, MD;  Location: [facility];  Service: Gastroenterology;  Laterality: N/A;  . NO PAST SURGERIES      Current Medications: Prior to Admission medications   Medication Sig Start Date End Date Taking? Authorizing Provider  diltiazem (CARTIA XT) 240 MG 24 hr capsule Take 1 capsule (240 mg total) by mouth daily. MEDICAID [id number] P 08/27/16   Dellia Nims, MD  fluticasone (FLONASE) 50 MCG/ACT nasal spray Place 1 spray into both nostrils daily. Patient taking differently: Place 1 spray into both nostrils daily as needed for allergies.  06/28/16   Dellia Nims, MD  furosemide (LASIX) 40 MG tablet Take 2 tablets (80 mg total) by mouth 2 (two) times daily. 08/05/16   Fay Records, MD  Na Sulfate-K Sulfate-Mg Sulf 17.5-3.13-1.6 GM/180ML SOLN Suprep (no substitutions)-TAKE AS DIRECTED. 07/08/16   Doran Stabler, MD  nicotine polacrilex (NICORETTE) 2 MG gum Take 1 each (2  mg total) by mouth as needed for smoking cessation. 08/19/16   Dellia Nims, MD  potassium chloride (K-DUR) 10 MEQ tablet Take 1 tablet (10 mEq total) by mouth 2 (two) times daily. 04/23/16 07/22/16  Dellia Nims, MD  rivaroxaban (XARELTO) 20 MG TABS tablet Take 1 tablet (20 mg total) by mouth daily with supper. 08/05/16   Fay Records, MD    Allergies:   Lactose intolerance (gi)   Social History   Social History  . Marital status: Single     Spouse name: N/A  . Number of children: N/A  . Years of education: N/A   Occupational History  . unemployed     Previous Training and development officer    Social History Main Topics  . Smoking status: Current Every Day Smoker    Packs/day: 0.20    Types: Cigarettes  . Smokeless tobacco: Never Used     Comment: down to 1-2 cig/day with NRT  . Alcohol use 3.0 oz/week    5 Standard drinks or equivalent per week     Comment: 2 times a week beer  . Drug use: No  . Sexual activity: Not Asked   Other Topics Concern  . None   Social History Narrative  . None     Family History:  The patient's family history includes Diabetes in his mother; Hypertension in his maternal grandfather and mother; Pneumonia in his father.   ROS:   Please see the history of present illness.    ROS All other systems reviewed and are negative.   PHYSICAL EXAM:   VS:  BP 136/88   Pulse 92   Ht 5\' 11"  (1.803 m)   Wt (!) 344 lb (156 kg)   SpO2 98%   BMI 47.98 kg/m    GEN: Well nourished, well developed, in no acute distress  HEENT: Normal  Neck: no JVD, carotid bruits, or masses Cardiac: RRR; no murmurs, rubs, or gallops,1 + BL LE edema  Respiratory: Diminished breath sound GI: soft, nontender, nondistended, + BS MS: no deformity or atrophy  Skin: warm and dry, no rash Neuro:  Alert and Oriented x 3, Strength and sensation are intact Psych: euthymic mood, full affect  Wt Readings from Last 3 Encounters:  01/07/17 (!) 344 lb (156 kg)  08/19/16 (!) 338 lb 6.4 oz (153.5 kg)  08/05/16 (!) 328 lb (148.8 kg)      Studies/Labs Reviewed:   EKG:  EKG is not  ordered today.   Recent Labs: 08/05/2016: BUN 20; Creatinine, Ser 1.00; Hemoglobin 15.3; NT-Pro BNP 385; Platelets 141; Potassium 4.1; Sodium 139   Lipid Panel    Component Value Date/Time   CHOL 105 08/17/2015 1233   TRIG 182 (H) 08/17/2015 1233   HDL 28 (L) 08/17/2015 1233   CHOLHDL 3.8 08/17/2015 1233   VLDL 36 08/17/2015 1233   LDLCALC 41 08/17/2015 1233      Additional studies/ records that were reviewed today include:   Echocardiogram: 08/18/15 LV EF: 50% - 55%  ------------------------------------------------------------------- Indications: Atrial flutter 427.32.  ------------------------------------------------------------------- History: PMH: Chest pain. Dyspnea. PMH: Syncope. Risk factors: Current tobacco use.  ------------------------------------------------------------------- Study Conclusions  - Left ventricle: Difficult acoustical windows make assessment of wall motion difficult even with definity. Study is suggestive of apical hypokinesis. The cavity size was normal. There was moderate focal basal and mild concentric hypertrophy. Systolic function was normal. The estimated ejection fraction was in the range of 50% to 55%. Diffuse hypokinesis. The study was not technically sufficient to allow  evaluation of LV diastolic dysfunction due to atrial fibrillation. - Aortic valve: Trileaflet; mildly thickened, mildly calcified leaflets. - Mitral valve: There was trivial regurgitation. - Left atrium: The atrium was severely dilated. - Right atrium: The atrium was severely dilated. - Tricuspid valve: There was mild regurgitation. - Pulmonary arteries: PA peak pressure: 37 mm Hg (S).  Impressions:  - The right ventricular systolic pressure was increased consistent with mild pulmonary hypertension.   Cardiac Catheterization: 08/21/15 Right/Left Heart Cath and Coronary Angiography  Conclusion    Angiographically minimal coronary artery disease. Large draping vessels. Several very tortuous  Mildly elevated secondary pulmonary hypertension  Mildly elevated signal April medication for elevated LVEDP. Suggest more diastolic dysfunction       ASSESSMENT & PLAN:   1. Permenent atrial flutter - Rate controlled. Continue Cardizem and Xarelto.   2. Acute on chronic diastolic  CHF - Has LE edema. Get BMET, BNP and CXR. Also concerning for bronchitis given cough and tobacco smoking. Meds adjustment pending result.   3. OSA - Now has insurance. Will set up Appointment with Dr. Radford Pax for CPAP.  4. HTN - Stable on Cardizem.  5. Tobacco smoking - Advised cessation.   Medication Adjustments/Labs and Tests Ordered: Current medicines are reviewed at length with the patient today.  Concerns regarding medicines are outlined above.  Medication changes, Labs and Tests ordered today are listed in the Patient Instructions below. Patient Instructions  Your physician recommends that you continue on your current medications as directed. Please refer to the Current Medication list given to you today. Your physician recommends that you return for lab work in:   TODAY BMET AND BNP  A chest x-ray takes a picture of the organs and structures inside the chest, including the heart, lungs, and blood vessels. This test can show several things, including, whether the heart is enlarges; whether fluid is building up in the lungs; and whether pacemaker / defibrillator leads are still in place.  Your physician recommends that you schedule a follow-up appointment in:  NEXT AVAILABLE WITH DR Rye 3-4  MONTHS WITH DR  Devoria Glassing, [place]  01/07/2017 3:17 PM    [facility] [address], [place], [place]  [address] Phone: [phone]; Fax: [phone]

## 2017-01-07 ENCOUNTER — Ambulatory Visit (INDEPENDENT_AMBULATORY_CARE_PROVIDER_SITE_OTHER): Payer: Medicaid Other | Admitting: Physician Assistant

## 2017-01-07 ENCOUNTER — Encounter: Payer: Self-pay | Admitting: Physician Assistant

## 2017-01-07 VITALS — BP 136/88 | HR 92 | Ht 71.0 in | Wt 344.0 lb

## 2017-01-07 DIAGNOSIS — G4733 Obstructive sleep apnea (adult) (pediatric): Secondary | ICD-10-CM

## 2017-01-07 DIAGNOSIS — J40 Bronchitis, not specified as acute or chronic: Secondary | ICD-10-CM

## 2017-01-07 DIAGNOSIS — I5033 Acute on chronic diastolic (congestive) heart failure: Secondary | ICD-10-CM | POA: Diagnosis not present

## 2017-01-07 DIAGNOSIS — R0602 Shortness of breath: Secondary | ICD-10-CM | POA: Diagnosis not present

## 2017-01-07 DIAGNOSIS — R601 Generalized edema: Secondary | ICD-10-CM

## 2017-01-07 DIAGNOSIS — I10 Essential (primary) hypertension: Secondary | ICD-10-CM | POA: Diagnosis not present

## 2017-01-07 NOTE — Patient Instructions (Signed)
Your physician recommends that you continue on your current medications as directed. Please refer to the Current Medication list given to you today. Your physician recommends that you return for lab work in:   TODAY BMET AND BNP  A chest x-ray takes a picture of the organs and structures inside the chest, including the heart, lungs, and blood vessels. This test can show several things, including, whether the heart is enlarges; whether fluid is building up in the lungs; and whether pacemaker / defibrillator leads are still in place.  Your physician recommends that you schedule a follow-up appointment in:  NEXT AVAILABLE WITH DR TURNER FOR SLEEP APNEA 3-4  MONTHS WITH DR  Harrington Challenger

## 2017-01-08 LAB — BASIC METABOLIC PANEL
BUN/Creatinine Ratio: 13 (ref 9–20)
BUN: 11 mg/dL (ref 6–24)
CALCIUM: 8.9 mg/dL (ref 8.7–10.2)
CHLORIDE: 97 mmol/L (ref 96–106)
CO2: 28 mmol/L (ref 20–29)
Creatinine, Ser: 0.82 mg/dL (ref 0.76–1.27)
GFR calc non Af Amer: 100 mL/min/{1.73_m2} (ref >59)
GFR, EST AFRICAN AMERICAN: 115 mL/min/{1.73_m2} (ref >59)
GLUCOSE: 91 mg/dL (ref 65–99)
Potassium: 4.5 mmol/L (ref 3.5–5.2)
Sodium: 140 mmol/L (ref 134–144)

## 2017-01-08 LAB — PRO B NATRIURETIC PEPTIDE: NT-PRO BNP: 678 pg/mL — AB (ref 0–210)

## 2017-01-09 ENCOUNTER — Encounter: Payer: Self-pay | Admitting: Cardiology

## 2017-01-09 ENCOUNTER — Telehealth: Payer: Self-pay

## 2017-01-09 ENCOUNTER — Encounter: Payer: Medicaid Other | Admitting: Cardiology

## 2017-01-09 ENCOUNTER — Ambulatory Visit: Payer: Self-pay | Admitting: Physician Assistant

## 2017-01-09 VITALS — BP 154/80 | HR 94 | Ht 71.0 in | Wt 341.0 lb

## 2017-01-09 DIAGNOSIS — I5032 Chronic diastolic (congestive) heart failure: Secondary | ICD-10-CM

## 2017-01-09 NOTE — Progress Notes (Deleted)
Cardiology Office Note:    Date:  01/09/2017   ID:  Frank Morales, DOB October 28, 1960, MRN [id number]  PCP:  Thomasene Ripple, MD  Cardiologist:  Fransico Him, MD   Referring MD: Thomasene Ripple, MD   Chief Complaint  Patient presents with  . Sleep Apnea  . Hypertension    History of Present Illness:    Frank Morales is a 56 y.o. male with a hx of atrial fibrillation and chronic diastolic CHF who was referred by Frank Palau, NP in afib clinic for evaluation of OSA.  He underwent PSG which showed Moderate OSA with an AHI of 23.4/hr and oxygen desaturations as low as 75%.  He underwent CPAP titration to 20cm H2O and is now here for followup.He is doing well with his CPAP device and thinks that he has gotten used to it.  He tolerates the mask and feels the pressure is adequate.  Since going on CPAP he feels rested in the am and has no significant daytime sleepiness.  He denies any significant mouth or nasal dryness or nasal congestion.  He does not think that he snores.    Past Medical History:  Diagnosis Date  . Atrial fibrillation and flutter ([place]) 08/17/2015   A. S/p failed DCCV // b. Severe BAE on echo >> rate control strategy (has seen AF clinic) // c. Xarelto for anticoag (CHADS2-VASc=2 / CHF, HTN)  . Chronic diastolic CHF (congestive heart failure) ([place]) 08/30/2015   A. Echo 6/17: Apical HK, moderate focal basal and mild concentric LVH, EF 50-55, diffuse HK, trivial MR, severe BAE, mild TR, PASP 37  . Hepatitis C, chronic ([place]) 08/19/2015  . History of cardiac catheterization    a. LHC 6/17: LAD irregs, o/w no CAD  . OSA (obstructive sleep apnea) 11/21/2015  . Sleep apnea   . Tobacco abuse     Past Surgical History:  Procedure Laterality Date  . CARDIAC CATHETERIZATION N/A 08/21/2015   Procedure: Right/Left Heart Cath and Coronary Angiography;  Surgeon: Leonie Man, MD;  Location: [facility];  Service: Cardiovascular;  Laterality: N/A;  . CARDIOVERSION N/A  09/22/2015   Procedure: CARDIOVERSION;  Surgeon: Josue Hector, MD;  Location: [facility] ENDOSCOPY;  Service: Cardiovascular;  Laterality: N/A;  . COLONOSCOPY N/A 07/19/2016   Procedure: COLONOSCOPY;  Surgeon: Doran Stabler, MD;  Location: [facility] ENDOSCOPY;  Service: Gastroenterology;  Laterality: N/A;  . ESOPHAGOGASTRODUODENOSCOPY N/A 07/19/2016   Procedure: ESOPHAGOGASTRODUODENOSCOPY (EGD);  Surgeon: Doran Stabler, MD;  Location: [facility];  Service: Gastroenterology;  Laterality: N/A;  . NO PAST SURGERIES      Current Medications: No outpatient prescriptions have been marked as taking for the 01/09/17 encounter (Office Visit) with Sueanne Margarita, MD.     Allergies:   Lactose intolerance (gi)   Social History   Social History  . Marital status: Single    Spouse name: N/A  . Number of children: N/A  . Years of education: N/A   Occupational History  . unemployed     Previous Training and development officer    Social History Main Topics  . Smoking status: Current Every Day Smoker    Packs/day: 0.20    Types: Cigarettes  . Smokeless tobacco: Never Used     Comment: down to 1-2 cig/day with NRT  . Alcohol use 3.0 oz/week    5 Standard drinks or equivalent per week     Comment: 2 times a week beer  . Drug use: No  . Sexual  activity: Not on file   Other Topics Concern  . Not on file   Social History Narrative  . No narrative on file     Family History: The patient's family history includes Diabetes in his mother; Hypertension in his maternal grandfather and mother; Pneumonia in his father. There is no history of Colon cancer.  ROS:   Please see the history of present illness.    ROS  All other systems reviewed and negative.   EKGs/Labs/Other Studies Reviewed:    The following studies were reviewed today: CPAP download  EKG:  EKG is not ordered today.   Recent Labs: 08/05/2016: Hemoglobin 15.3; Platelets 141 01/07/2017: BUN 11; Creatinine, Ser 0.82; NT-Pro BNP 678; Potassium 4.5; Sodium  140   Recent Lipid Panel    Component Value Date/Time   CHOL 105 08/17/2015 1233   TRIG 182 (H) 08/17/2015 1233   HDL 28 (L) 08/17/2015 1233   CHOLHDL 3.8 08/17/2015 1233   VLDL 36 08/17/2015 1233   LDLCALC 41 08/17/2015 1233    Physical Exam:    VS:  There were no vitals taken for this visit.    Wt Readings from Last 3 Encounters:  01/07/17 (!) 344 lb (156 kg)  08/19/16 (!) 338 lb 6.4 oz (153.5 kg)  08/05/16 (!) 328 lb (148.8 kg)     GEN:  Well nourished, well developed in no acute distress HEENT: Normal NECK: No JVD; No carotid bruits LYMPHATICS: No lymphadenopathy CARDIAC: RRR, no murmurs, rubs, gallops RESPIRATORY:  Clear to auscultation without rales, wheezing or rhonchi  ABDOMEN: Soft, non-tender, non-distended MUSCULOSKELETAL:  No edema; No deformity  SKIN: Warm and dry NEUROLOGIC:  Alert and oriented x 3 PSYCHIATRIC:  Normal affect   ASSESSMENT:    1. OSA (obstructive sleep apnea)   2. Essential hypertension   3. Severe obesity (BMI >= 40) (HCC)    PLAN:    In order of problems listed above:  1.  OSA - the patient is tolerating PAP therapy well without any problems. The PAP download was reviewed today and showed an AHI of ***/hr on *** cm H2O with ***% compliance in using more than 4 hours nightly.  The patient has been using and benefiting from CPAP use and will continue to benefit from therapy.   2.  HTN - BP is well controlled on current meds.   3.  Morbid obesity - I have encouraged him to get into a routine exercise program and cut back on carbs and portions.    Medication Adjustments/Labs and Tests Ordered: Current medicines are reviewed at length with the patient today.  Concerns regarding medicines are outlined above.  No orders of the defined types were placed in this encounter.  No orders of the defined types were placed in this encounter.   Signed, Fransico Him, MD  01/09/2017 2:51 PM    [place]

## 2017-01-09 NOTE — Progress Notes (Signed)
This encounter was created in error - please disregard.  This encounter was created in error - please disregard.

## 2017-01-09 NOTE — Telephone Encounter (Signed)
Patient directly notified and voiced understanding. Patient stated he would come to the office on Friday to have his labs completed.   Per Vin Notes recorded by Leanor Kail, PA on 01/09/2017 at 8:46 AM EDT Increase fluid marker. Increase Lasix to 80mg  TID and Kdur to 52meq TID for 4 days and then back to regular dose. BMET and BNP in one week.

## 2017-01-27 ENCOUNTER — Telehealth: Payer: Self-pay | Admitting: *Deleted

## 2017-01-27 NOTE — Telephone Encounter (Signed)
Late Entry: Patient was scheduled for a sleep appointment with Dr Radford Pax ON 01/09/2017 but the patient never got set up with a insurance carrier or a CPAP. Patient states he has insurance now and wants to get a CPAP. CPAP assistant informed the patient that he needs to have his new card scanned into Epic so when the doctor writes the order for his CPAP he will have an insurance card on file. Patient verbalized understanding and stated he will bring his card into the office.

## 2017-01-28 ENCOUNTER — Other Ambulatory Visit: Payer: Medicaid Other

## 2017-01-28 LAB — BASIC METABOLIC PANEL
BUN/Creatinine Ratio: 16 (ref 9–20)
BUN: 12 mg/dL (ref 6–24)
CALCIUM: 9.1 mg/dL (ref 8.7–10.2)
CO2: 29 mmol/L (ref 20–29)
Chloride: 97 mmol/L (ref 96–106)
Creatinine, Ser: 0.77 mg/dL (ref 0.76–1.27)
GFR, EST AFRICAN AMERICAN: 118 mL/min/{1.73_m2} (ref >59)
GFR, EST NON AFRICAN AMERICAN: 102 mL/min/{1.73_m2} (ref >59)
Glucose: 95 mg/dL (ref 65–99)
Potassium: 4.1 mmol/L (ref 3.5–5.2)
Sodium: 141 mmol/L (ref 134–144)

## 2017-01-28 LAB — BRAIN NATRIURETIC PEPTIDE: BNP: 251.3 pg/mL — ABNORMAL HIGH (ref 0.0–100.0)

## 2017-02-03 ENCOUNTER — Encounter: Payer: Self-pay | Admitting: *Deleted

## 2017-02-11 ENCOUNTER — Telehealth: Payer: Self-pay | Admitting: Physician Assistant

## 2017-02-11 NOTE — Telephone Encounter (Signed)
Returned pts call.  He had received my letter in the mail re: lab results. Pt has been advised of lab results and he verbalized understanding.

## 2017-02-11 NOTE — Telephone Encounter (Signed)
F/u message  Pt returning RN call from letter received about lab results. Please call back to discuss

## 2017-03-03 ENCOUNTER — Other Ambulatory Visit: Payer: Self-pay

## 2017-03-03 ENCOUNTER — Ambulatory Visit (INDEPENDENT_AMBULATORY_CARE_PROVIDER_SITE_OTHER): Payer: Medicaid Other | Admitting: Internal Medicine

## 2017-03-03 ENCOUNTER — Encounter: Payer: Self-pay | Admitting: Internal Medicine

## 2017-03-03 ENCOUNTER — Telehealth: Payer: Self-pay | Admitting: Internal Medicine

## 2017-03-03 VITALS — BP 132/84 | HR 65 | Temp 97.7°F | Ht 71.0 in | Wt 351.1 lb

## 2017-03-03 DIAGNOSIS — Z6841 Body Mass Index (BMI) 40.0 and over, adult: Secondary | ICD-10-CM | POA: Diagnosis not present

## 2017-03-03 DIAGNOSIS — E669 Obesity, unspecified: Secondary | ICD-10-CM | POA: Diagnosis not present

## 2017-03-03 DIAGNOSIS — I4891 Unspecified atrial fibrillation: Secondary | ICD-10-CM | POA: Diagnosis not present

## 2017-03-03 DIAGNOSIS — I10 Essential (primary) hypertension: Secondary | ICD-10-CM

## 2017-03-03 DIAGNOSIS — F17210 Nicotine dependence, cigarettes, uncomplicated: Secondary | ICD-10-CM | POA: Diagnosis not present

## 2017-03-03 DIAGNOSIS — R0981 Nasal congestion: Secondary | ICD-10-CM

## 2017-03-03 DIAGNOSIS — I110 Hypertensive heart disease with heart failure: Secondary | ICD-10-CM

## 2017-03-03 DIAGNOSIS — I5032 Chronic diastolic (congestive) heart failure: Secondary | ICD-10-CM

## 2017-03-03 DIAGNOSIS — Z23 Encounter for immunization: Secondary | ICD-10-CM | POA: Diagnosis not present

## 2017-03-03 DIAGNOSIS — Z79899 Other long term (current) drug therapy: Secondary | ICD-10-CM

## 2017-03-03 DIAGNOSIS — L814 Other melanin hyperpigmentation: Secondary | ICD-10-CM

## 2017-03-03 DIAGNOSIS — Z7901 Long term (current) use of anticoagulants: Secondary | ICD-10-CM | POA: Diagnosis not present

## 2017-03-03 MED ORDER — LORATADINE 10 MG PO TABS: 10.0000 mg | ORAL_TABLET | Freq: Every day | ORAL | 11 refills | Status: DC

## 2017-03-03 NOTE — Telephone Encounter (Signed)
Patient is went to pick up medicine from pharmacy, pharmacy only received one order. Missing second prescription

## 2017-03-03 NOTE — Telephone Encounter (Signed)
rtc to pt, he misunderstood what he was to use to irrigate with, went over discharge instructions again w/ pt

## 2017-03-03 NOTE — Assessment & Plan Note (Addendum)
Patient's BP at goal <140/90 today. Patient will continue on current regimen of diltiazem 240 mg daily and lasix 80 mg BID.  Plan: -Continue diltiazem 240 mg daily, lasix 80 mg BID (TID intermittently as per cardiologist) -RTC in 3 months for follow up

## 2017-03-03 NOTE — Assessment & Plan Note (Signed)
Patient denies symptoms of SOB and worsening LE edema. He does have significant LE edema with hyperpigmentation on exam, which patient treats with lasix 80 mg TID every other day (BID normally). Recommended continued use of compression stockings, which patient has stopped using recently along with current diuretic regimen. Patient does not have signs/symptoms of pulmonary congestion today, which is reassuring that patient not in acute on chronic CHF exacerbation. Patient has follow up with scheduled with cardiologist at 04/10/2016.   Plan: -Continue lasix 80 mg BID, TID PRN -Start use of compression stockings for bilateral LE edema -Follow up with cardiologist as scheduled

## 2017-03-03 NOTE — Progress Notes (Signed)
   CC: CHF follow up  HPI:  [name] is a 56 y.o. with PMH of HTN, A-Fib on diltazem and Xarelto and chronic HFpEF who presents for management of chronic medical conditions. Patient was last seen in clinic on 08/2016. Since then the patient has felt well and has continued following up with cardiologist. In the interim the patient reports that he developed worsening swelling of his legs and some SOB. He was told to increase lasix from 80 mg BID to 80mg  TID every other day to help with these symptoms. Since doing this he has felt well. He states that he has experienced sinus congestion for many years and that this problem has recently started to bother him again. He used to get some relief from use of Flonase, however this medication has not been working for him recently. He reports some post-nasal drip and nighttime cough. Denies headaches, fevers, myalgias, arthralgias, nausea, vomiting, and recent sick contacts.   Past Medical History: Past Medical History:  Diagnosis Date  . Atrial fibrillation and flutter ([place]) 08/17/2015   A. S/p failed DCCV // b. Severe BAE on echo >> rate control strategy (has seen AF clinic) // c. Xarelto for anticoag (CHADS2-VASc=2 / CHF, HTN)  . Chronic diastolic CHF (congestive heart failure) ([place]) 08/30/2015   A. Echo 6/17: Apical HK, moderate focal basal and mild concentric LVH, EF 50-55, diffuse HK, trivial MR, severe BAE, mild TR, PASP 37  . Hepatitis C, chronic ([place]) 08/19/2015  . History of cardiac catheterization    a. LHC 6/17: LAD irregs, o/w no CAD  . OSA (obstructive sleep apnea) 11/21/2015  . Sleep apnea   . Tobacco abuse    Review of Systems:   Patient endorses lower extremity swelling, sinus congestion, and cough, as per HPI Patient denies chest pain, shortness of breath, abdominal pain, diaphoresis, nausea/vomiting, and change in bowel/bladder habits.  Physical Exam:  Vitals:   03/03/17 1359  BP: 132/84  Pulse: 65  Temp: 97.7 F (36.5 C)    TempSrc: Oral  SpO2: 98%  Weight: (!) 351 lb 1.6 oz (159.3 kg)  Height: 5\' 11"  (1.803 m)   Physical Exam  Constitutional:  Obese gentleman sitting comfortably in chair in no acute distress  HENT:  Mouth/Throat: Oropharynx is clear and moist. No oropharyngeal exudate.  Bilateral TM clear without erythema or effusions  Cardiovascular: Normal rate, regular rhythm and intact distal pulses. Exam reveals no friction rub.  No murmur heard. Respiratory: Effort normal and breath sounds normal. No respiratory distress. He has no wheezes.  No crackles appreciated.  GI: Soft. Bowel sounds are normal. He exhibits no distension. There is no tenderness.  Musculoskeletal: He exhibits edema (1-2+ pitting edema to knees bilaterally). He exhibits no tenderness (of bilateral lower extremities).  Lymphadenopathy:    He has no cervical adenopathy.  Skin: Skin is warm and dry. No rash noted. No erythema.  Areas of hyperpigmentation on bilateral lower extremities without overlying ulceration.   Assessment & Plan:   See Encounters Tab for problem based charting.  Patient seen with Dr. Angelia Mould.

## 2017-03-03 NOTE — Patient Instructions (Addendum)
Thank you for seeing [place] in the clinic today!  You were seen for your chronic medical conditions. You are doing well with your smoking cessation! Keep up the great work! If you would like a medication to help with your smoking, please contact the clinic at the phone number provided below.   I've prescribed a daily allergy medication and written out instructions to help with your chronic sinus congestion (see below). Please try daily nasal irrigation, along with daily claritin and flonase, for a few weeks to help with your symptoms.   Please return to the clinic in 3-6 months for follow up of your chronic medical conditions. Please continue to follow up with your cardiologist as previously scheduled.   If you have any questions or concerns, please call our clinic at [phone] between the hours of 9am-5pm. If you have a problem after these hours, please call [phone] and ask for the internal medicine resident on call. If you feel you are having a medical emergency please call 911.   Thanks, Dr. Larena Glassman Joquan Lotz  BUFFERED ISOTONIC SALINE NASAL IRRIGATION The Benefits: 1. When you irrigate, the isotonic saline (salt water) acts as a solvent and washes the mucus crusts and other debris from your nose.  2. This decongests and improves the airflow into your nose. The sinus passages begin to open.  3. Studies have also shown that a salt water and an alkaline (baking soda) irrigation solution improves nasal membrane cell function (mucociliary flow of mucus debris).  The Recipe: 1. Choose a 1-quart glass jar that is thoroughly cleansed.  2. Fill with sterile or distilled water, or you can boil water from the tap.  3. Add 1 to 2 heaping teaspoons of "pickling/canning/sea" salt (NOT table salt as it contains a large number of additives). This salt is available at the grocery store in the food canning section.  4. Add 1 teaspoon of [employer] Hammer Baking Soda (pure bicarbonate).  5. Mix  ingredients together and store at room temperature. Discard after one week. If you find this solution too strong, you may decrease the amount of salt added to 1 to 1  teaspoons. With children it is often best to start with a milder solution and advance slowly. Irrigate with 240 ml (8 oz) twice daily.  The Instructions: You should plan to irrigate your nose with buffered isotonic saline 2 times per day. Many people prefer to warm the solution slightly in the microwave - but be sure that the solution is NOT HOT. Stand over the sink (some do this in the shower) and squirt the solution into each side of your nose, keeping your mouth open. This allows you to spit the saltwater out of your mouth. It will not harm you if you swallow a little.  If you have been told to use a nasal steroid such as Flonase, Nasonex, or Nasacort, you should always use isortonic saline solution first, then use your nasal steroid product. The nasal steroid is much more effective when sprayed onto clean nasal membranes and the steroid medicine will reach deeper into the nose.  Most people experience a little burning sensation the first few times they use a isotonic saline solution, but this usually goes away within a few days.

## 2017-03-03 NOTE — Assessment & Plan Note (Addendum)
Patient reports chronic sinus congestion with intermittently improves with use of nasal steroids. Patient does not currently have signs or symptoms worrisome for infection on PE. Patient was educated about how allergies can contribute to chronic congestion and prescribed an oral loratadine during today's visit. He was instructed to start daily nasal irrigation to help with congestion and to continue use of daily flonase. Patient was also educated that tobacco cessation may help with symptoms, particularly his nighttime cough which appears to be related to post-nasal drip. Patient currently using 2-3 cigarettes per day and trying to quit with assitance of gum. Patient offered but denied medication for smoking cessation.  Plan: -Daily loratadine and flonase -Daily nasal irrigation -Smoking cessation with current use of nicotine gum, patient denied use of prescription medication at this time.

## 2017-03-04 NOTE — Progress Notes (Signed)
Internal Medicine Clinic Attending  I saw and evaluated the patient.  I personally confirmed the key portions of the history and exam documented by Dr. Nedrud and I reviewed pertinent patient test results.  The assessment, diagnosis, and plan were formulated together and I agree with the documentation in the resident's note.  

## 2017-04-10 ENCOUNTER — Ambulatory Visit: Payer: Medicaid Other | Admitting: Internal Medicine

## 2017-04-10 ENCOUNTER — Other Ambulatory Visit: Payer: Self-pay | Admitting: Pharmacist

## 2017-04-10 ENCOUNTER — Telehealth: Payer: Self-pay | Admitting: Pharmacist

## 2017-04-10 ENCOUNTER — Other Ambulatory Visit: Payer: Self-pay | Admitting: Internal Medicine

## 2017-04-10 DIAGNOSIS — I4891 Unspecified atrial fibrillation: Secondary | ICD-10-CM

## 2017-04-10 DIAGNOSIS — I4892 Unspecified atrial flutter: Secondary | ICD-10-CM

## 2017-04-10 DIAGNOSIS — I10 Essential (primary) hypertension: Secondary | ICD-10-CM

## 2017-04-10 DIAGNOSIS — F17200 Nicotine dependence, unspecified, uncomplicated: Secondary | ICD-10-CM

## 2017-04-10 DIAGNOSIS — R0981 Nasal congestion: Secondary | ICD-10-CM

## 2017-04-10 DIAGNOSIS — E876 Hypokalemia: Secondary | ICD-10-CM

## 2017-04-10 MED ORDER — DILTIAZEM HCL ER COATED BEADS 240 MG PO CP24
240.0000 mg | ORAL_CAPSULE | Freq: Every day | ORAL | 2 refills | Status: DC
Start: 2017-04-10 — End: 2017-06-24

## 2017-04-10 MED ORDER — RIVAROXABAN 20 MG PO TABS: 20.0000 mg | ORAL_TABLET | Freq: Every day | ORAL | 3 refills | Status: DC

## 2017-04-10 MED ORDER — POTASSIUM CHLORIDE ER 10 MEQ PO TBCR: 10.0000 meq | EXTENDED_RELEASE_TABLET | Freq: Two times a day (BID) | ORAL | 3 refills | Status: DC

## 2017-04-10 MED ORDER — NICOTINE POLACRILEX 2 MG MT GUM: 2.0000 mg | CHEWING_GUM | OROMUCOSAL | 0 refills | Status: DC | PRN

## 2017-04-10 MED ORDER — FLUTICASONE PROPIONATE 50 MCG/ACT NA SUSP: 1.0000 | Freq: Every day | NASAL | 2 refills | Status: DC

## 2017-04-10 MED ORDER — FUROSEMIDE 40 MG PO TABS: 80.0000 mg | ORAL_TABLET | Freq: Two times a day (BID) | ORAL | 2 refills | Status: DC

## 2017-04-10 NOTE — Telephone Encounter (Signed)
Pt's medication was sent to pt's pharmacy as requested. Confirmation received.  °

## 2017-04-10 NOTE — Progress Notes (Signed)
Patient requested transfers to [facility] pharmacy. He now has Medicaid and is no longer enrolled in [facility]. Prescriptions sent.

## 2017-04-11 NOTE — Progress Notes (Signed)
Patient requested medication transfers to [facility]. He now has Medicaid and is no longer enrolled in [facility]. Sent prescriptions per patient request.

## 2017-05-16 ENCOUNTER — Ambulatory Visit: Payer: Medicaid Other | Admitting: Internal Medicine

## 2017-06-05 ENCOUNTER — Encounter: Payer: Self-pay | Admitting: Internal Medicine

## 2017-06-23 ENCOUNTER — Other Ambulatory Visit: Payer: Self-pay | Admitting: Internal Medicine

## 2017-06-23 DIAGNOSIS — I10 Essential (primary) hypertension: Secondary | ICD-10-CM

## 2017-06-24 ENCOUNTER — Other Ambulatory Visit: Payer: Self-pay | Admitting: Internal Medicine

## 2017-06-24 DIAGNOSIS — I10 Essential (primary) hypertension: Secondary | ICD-10-CM

## 2017-06-24 MED ORDER — DILTIAZEM HCL ER COATED BEADS 240 MG PO CP24: 240.0000 mg | ORAL_CAPSULE | Freq: Every day | ORAL | 0 refills | Status: DC

## 2017-06-24 NOTE — Telephone Encounter (Signed)
NEEDS REFILL, DILTIAZEM 240MG , [facility]. [phone]

## 2017-07-03 ENCOUNTER — Other Ambulatory Visit: Payer: Self-pay

## 2017-07-03 ENCOUNTER — Encounter (HOSPITAL_COMMUNITY): Payer: Self-pay

## 2017-07-03 ENCOUNTER — Inpatient Hospital Stay (HOSPITAL_COMMUNITY)
Admission: EM | Admit: 2017-07-03 | Discharge: 2017-07-09 | DRG: 291 | Disposition: A | Discharge status: Home / Self Care | Payer: Medicaid Other | Attending: Internal Medicine | Admitting: Internal Medicine

## 2017-07-03 ENCOUNTER — Emergency Department (HOSPITAL_COMMUNITY): Payer: Medicaid Other

## 2017-07-03 DIAGNOSIS — J449 Chronic obstructive pulmonary disease, unspecified: Secondary | ICD-10-CM | POA: Diagnosis present

## 2017-07-03 DIAGNOSIS — F17210 Nicotine dependence, cigarettes, uncomplicated: Secondary | ICD-10-CM | POA: Diagnosis present

## 2017-07-03 DIAGNOSIS — I110 Hypertensive heart disease with heart failure: Secondary | ICD-10-CM | POA: Diagnosis present

## 2017-07-03 DIAGNOSIS — E739 Lactose intolerance, unspecified: Secondary | ICD-10-CM | POA: Diagnosis present

## 2017-07-03 DIAGNOSIS — Z6841 Body Mass Index (BMI) 40.0 and over, adult: Secondary | ICD-10-CM | POA: Diagnosis not present

## 2017-07-03 DIAGNOSIS — Z7951 Long term (current) use of inhaled steroids: Secondary | ICD-10-CM

## 2017-07-03 DIAGNOSIS — I509 Heart failure, unspecified: Secondary | ICD-10-CM | POA: Diagnosis not present

## 2017-07-03 DIAGNOSIS — R0602 Shortness of breath: Secondary | ICD-10-CM

## 2017-07-03 DIAGNOSIS — B182 Chronic viral hepatitis C: Secondary | ICD-10-CM | POA: Diagnosis present

## 2017-07-03 DIAGNOSIS — Z79899 Other long term (current) drug therapy: Secondary | ICD-10-CM

## 2017-07-03 DIAGNOSIS — G4733 Obstructive sleep apnea (adult) (pediatric): Secondary | ICD-10-CM | POA: Diagnosis present

## 2017-07-03 DIAGNOSIS — I4821 Permanent atrial fibrillation: Secondary | ICD-10-CM

## 2017-07-03 DIAGNOSIS — E876 Hypokalemia: Secondary | ICD-10-CM | POA: Diagnosis not present

## 2017-07-03 DIAGNOSIS — I872 Venous insufficiency (chronic) (peripheral): Secondary | ICD-10-CM | POA: Diagnosis present

## 2017-07-03 DIAGNOSIS — I5043 Acute on chronic combined systolic (congestive) and diastolic (congestive) heart failure: Secondary | ICD-10-CM | POA: Diagnosis not present

## 2017-07-03 DIAGNOSIS — Z8249 Family history of ischemic heart disease and other diseases of the circulatory system: Secondary | ICD-10-CM | POA: Diagnosis not present

## 2017-07-03 DIAGNOSIS — I5033 Acute on chronic diastolic (congestive) heart failure: Secondary | ICD-10-CM | POA: Diagnosis not present

## 2017-07-03 DIAGNOSIS — Z9111 Patient's noncompliance with dietary regimen: Secondary | ICD-10-CM

## 2017-07-03 DIAGNOSIS — I2729 Other secondary pulmonary hypertension: Secondary | ICD-10-CM | POA: Diagnosis present

## 2017-07-03 DIAGNOSIS — Z7901 Long term (current) use of anticoagulants: Secondary | ICD-10-CM

## 2017-07-03 DIAGNOSIS — I4892 Unspecified atrial flutter: Secondary | ICD-10-CM | POA: Diagnosis present

## 2017-07-03 DIAGNOSIS — Z716 Tobacco abuse counseling: Secondary | ICD-10-CM | POA: Diagnosis not present

## 2017-07-03 DIAGNOSIS — R0601 Orthopnea: Secondary | ICD-10-CM

## 2017-07-03 DIAGNOSIS — I10 Essential (primary) hypertension: Secondary | ICD-10-CM

## 2017-07-03 DIAGNOSIS — J9601 Acute respiratory failure with hypoxia: Secondary | ICD-10-CM | POA: Diagnosis present

## 2017-07-03 DIAGNOSIS — I482 Chronic atrial fibrillation: Secondary | ICD-10-CM | POA: Diagnosis present

## 2017-07-03 DIAGNOSIS — I493 Ventricular premature depolarization: Secondary | ICD-10-CM | POA: Diagnosis present

## 2017-07-03 DIAGNOSIS — I5032 Chronic diastolic (congestive) heart failure: Secondary | ICD-10-CM

## 2017-07-03 LAB — COMPREHENSIVE METABOLIC PANEL
ALBUMIN: 3.3 g/dL — AB (ref 3.5–5.0)
ALK PHOS: 47 U/L (ref 38–126)
ALT: 18 U/L (ref 17–63)
ANION GAP: 11 (ref 5–15)
AST: 20 U/L (ref 15–41)
BUN: 16 mg/dL (ref 6–20)
CALCIUM: 8.8 mg/dL — AB (ref 8.9–10.3)
CO2: 30 mmol/L (ref 22–32)
Chloride: 96 mmol/L — ABNORMAL LOW (ref 101–111)
Creatinine, Ser: 0.86 mg/dL (ref 0.61–1.24)
GFR calc non Af Amer: >60 mL/min (ref >60)
Glucose, Bld: 132 mg/dL — ABNORMAL HIGH (ref 65–99)
POTASSIUM: 3.8 mmol/L (ref 3.5–5.1)
Sodium: 137 mmol/L (ref 135–145)
Total Bilirubin: 0.4 mg/dL (ref 0.3–1.2)
Total Protein: 7.1 g/dL (ref 6.5–8.1)

## 2017-07-03 LAB — CBC WITH DIFFERENTIAL/PLATELET
BASOS ABS: 0 10*3/uL (ref 0.0–0.1)
BASOS PCT: 1 %
EOS ABS: 0.1 10*3/uL (ref 0.0–0.7)
EOS PCT: 2 %
HCT: 41.7 % (ref 39.0–52.0)
Hemoglobin: 13.5 g/dL (ref 13.0–17.0)
LYMPHS PCT: 28 %
Lymphs Abs: 1.8 10*3/uL (ref 0.7–4.0)
MCH: 31.3 pg (ref 26.0–34.0)
MCHC: 32.4 g/dL (ref 30.0–36.0)
MCV: 96.5 fL (ref 78.0–100.0)
MONO ABS: 0.7 10*3/uL (ref 0.1–1.0)
Monocytes Relative: 11 %
Neutro Abs: 3.8 10*3/uL (ref 1.7–7.7)
Neutrophils Relative %: 58 %
PLATELETS: 181 10*3/uL (ref 150–400)
RBC: 4.32 MIL/uL (ref 4.22–5.81)
RDW: 14.5 % (ref 11.5–15.5)
WBC: 6.5 10*3/uL (ref 4.0–10.5)

## 2017-07-03 LAB — I-STAT TROPONIN, ED: TROPONIN I, POC: 0 ng/mL (ref 0.00–0.08)

## 2017-07-03 LAB — MAGNESIUM: Magnesium: 2.2 mg/dL (ref 1.7–2.4)

## 2017-07-03 LAB — BRAIN NATRIURETIC PEPTIDE: B Natriuretic Peptide: 224.8 pg/mL — ABNORMAL HIGH (ref 0.0–100.0)

## 2017-07-03 MED ORDER — METHYLPREDNISOLONE SODIUM SUCC 40 MG IJ SOLR
40.0000 mg | Freq: Every day | INTRAMUSCULAR | Status: DC
  Administered 2017-07-03 – 2017-07-09 (×7): 40 mg via INTRAVENOUS
  Filled 2017-07-03 (×7): qty 1

## 2017-07-03 MED ORDER — RIVAROXABAN 20 MG PO TABS
20.0000 mg | ORAL_TABLET | Freq: Every day | ORAL | Status: DC
Start: 2017-07-03 — End: 2017-07-09
  Administered 2017-07-03 – 2017-07-08 (×6): 20 mg via ORAL
  Filled 2017-07-03 (×6): qty 1

## 2017-07-03 MED ORDER — IPRATROPIUM-ALBUTEROL 0.5-2.5 (3) MG/3ML IN SOLN
3.0000 mL | RESPIRATORY_TRACT | Status: DC | PRN
  Administered 2017-07-06: 3 mL via RESPIRATORY_TRACT
  Filled 2017-07-03: qty 3

## 2017-07-03 MED ORDER — DILTIAZEM HCL 30 MG PO TABS
30.0000 mg | ORAL_TABLET | Freq: Once | ORAL | Status: DC
  Filled 2017-07-03: qty 1

## 2017-07-03 MED ORDER — NICOTINE 14 MG/24HR TD PT24
14.0000 mg | MEDICATED_PATCH | Freq: Every day | TRANSDERMAL | Status: DC
Start: 2017-07-03 — End: 2017-07-09
  Administered 2017-07-03 – 2017-07-09 (×7): 14 mg via TRANSDERMAL
  Filled 2017-07-03 (×7): qty 1

## 2017-07-03 MED ORDER — FUROSEMIDE 10 MG/ML IJ SOLN
40.0000 mg | Freq: Once | INTRAMUSCULAR | Status: AC
  Administered 2017-07-03: 40 mg via INTRAVENOUS
  Filled 2017-07-03: qty 4

## 2017-07-03 MED ORDER — FUROSEMIDE 10 MG/ML IJ SOLN
40.0000 mg | Freq: Two times a day (BID) | INTRAMUSCULAR | Status: DC
  Administered 2017-07-03: 40 mg via INTRAVENOUS
  Filled 2017-07-03: qty 4

## 2017-07-03 MED ORDER — IPRATROPIUM-ALBUTEROL 0.5-2.5 (3) MG/3ML IN SOLN
3.0000 mL | Freq: Four times a day (QID) | RESPIRATORY_TRACT | Status: DC
  Administered 2017-07-03 – 2017-07-04 (×5): 3 mL via RESPIRATORY_TRACT
  Filled 2017-07-03 (×5): qty 3

## 2017-07-03 MED ORDER — FUROSEMIDE 10 MG/ML IJ SOLN
40.0000 mg | Freq: Three times a day (TID) | INTRAMUSCULAR | Status: DC
  Administered 2017-07-03 – 2017-07-04 (×3): 40 mg via INTRAVENOUS
  Filled 2017-07-03 (×3): qty 4

## 2017-07-03 MED ORDER — DILTIAZEM HCL ER COATED BEADS 240 MG PO CP24
240.0000 mg | ORAL_CAPSULE | Freq: Every day | ORAL | Status: DC
  Administered 2017-07-03 – 2017-07-07 (×5): 240 mg via ORAL
  Filled 2017-07-03 (×6): qty 1

## 2017-07-03 NOTE — Plan of Care (Signed)
Patient stable, maintains oxygen saturation in the mid 90's on 2 liters. Continues to be dyspneic but states this is improving with diuresis and breathing treatments.  Patient admits that he missed his cardiology appointment last month, does not have scales at home to weigh himself and has been smoking more d/t stress.

## 2017-07-03 NOTE — H&P (Signed)
History and Physical  [name] PNT:[id number] DOB: June 29, 1960 DOA: 07/03/2017  Referring physician: Dr Florina Ou PCP: Thomasene Ripple, MD  Outpatient Specialists:  Patient coming from: [place]  Chief Complaint: Shortness of breath  HPI: Frank Morales is a 57 y.o. male with medical history significant for chronic diastolic heart failure, chronic atrial fibrillation, chronic stasis dermatitis, obstructive sleep apnea, hypertension, hepatitis C, tobacco use disorder, morbid obesity, medical noncompliance who presented to the [facility] ED with complaints of shortness of breath of 3 weeks duration.  Patient reports it has been progressively getting worse.  States he has been compliant with his diuretics however he has been consuming a lot of salt.  Admits to orthopnea and nonproductive cough.  Not on oxygen at home.  Denies any chest pain, fever, or chills.  Upon presentation to the ED patient appears fluid overload.  Admitted for acute on chronic diastolic heart failure.  ED Course: Chest x-ray personally reviewed revealed cardiomegaly increase in pulmonary vascularity suggestive of pulmonary edema and small bilateral pleural effusions.  A. fib RVR on EKG personally reviewed with occasional PVCs rate of 118.  Review of Systems: Review of systems as noted in the HPI.  All other systems reviewed and are negative.   Past Medical History:  Diagnosis Date  . Atrial fibrillation and flutter ([place]) 08/17/2015   A. S/p failed DCCV // b. Severe BAE on echo >> rate control strategy (has seen AF clinic) // c. Xarelto for anticoag (CHADS2-VASc=2 / CHF, HTN)  . Chronic diastolic CHF (congestive heart failure) ([place]) 08/30/2015   A. Echo 6/17: Apical HK, moderate focal basal and mild concentric LVH, EF 50-55, diffuse HK, trivial MR, severe BAE, mild TR, PASP 37  . Hepatitis C, chronic ([place]) 08/19/2015  . History of cardiac catheterization    a. LHC 6/17: LAD irregs, o/w no CAD  . OSA (obstructive sleep apnea)  11/21/2015  . Sleep apnea   . Tobacco abuse    Past Surgical History:  Procedure Laterality Date  . CARDIAC CATHETERIZATION N/A 08/21/2015   Procedure: Right/Left Heart Cath and Coronary Angiography;  Surgeon: Leonie Man, MD;  Location: [place] CV LAB;  Service: Cardiovascular;  Laterality: N/A;  . CARDIOVERSION N/A 09/22/2015   Procedure: CARDIOVERSION;  Surgeon: Josue Hector, MD;  Location: [facility] ENDOSCOPY;  Service: Cardiovascular;  Laterality: N/A;  . COLONOSCOPY N/A 07/19/2016   Procedure: COLONOSCOPY;  Surgeon: Doran Stabler, MD;  Location: [facility] ENDOSCOPY;  Service: Gastroenterology;  Laterality: N/A;  . ESOPHAGOGASTRODUODENOSCOPY N/A 07/19/2016   Procedure: ESOPHAGOGASTRODUODENOSCOPY (EGD);  Surgeon: Doran Stabler, MD;  Location: [facility] ENDOSCOPY;  Service: Gastroenterology;  Laterality: N/A;  . NO PAST SURGERIES      Social History:  reports that he has been smoking cigarettes.  He has been smoking about 0.20 packs per day. He has never used smokeless tobacco. He reports that he drinks about 3.0 oz of alcohol per week. He reports that he does not use drugs.   Allergies  Allergen Reactions  . Lactose Intolerance (Gi) Nausea And Vomiting    Family History  Problem Relation Age of Onset  . Hypertension Mother   . Diabetes Mother   . Pneumonia Father   . Hypertension Maternal Grandfather   . Colon cancer Neg Hx     Mother and maternal aunt with diabetes  Prior to Admission medications   Medication Sig Start Date End Date Taking? Authorizing Provider  diltiazem (CARTIA XT) 240 MG 24 hr capsule Take 1  capsule (240 mg total) by mouth daily. MEDICAID [id number] P 06/24/17  Yes Nedrud, Larena Glassman, MD  fluticasone (FLONASE) 50 MCG/ACT nasal spray Place 1 spray into both nostrils daily. 04/10/17  Yes Nedrud, Larena Glassman, MD  furosemide (LASIX) 40 MG tablet Take 2 tablets (80 mg total) by mouth 2 (two) times daily. 04/10/17  Yes Fay Records, MD  loratadine (CLARITIN) 10 MG tablet Take 1  tablet (10 mg total) by mouth daily. 03/03/17  Yes NedrudLarena Glassman, MD  nicotine polacrilex (NICORETTE) 2 MG gum Take 1 each (2 mg total) by mouth as needed for smoking cessation. PHARMACIST PLS COUNSEL, DISPENSE BRAND COVERED BY MEDICAID 04/10/17  Yes Nedrud, Larena Glassman, MD  potassium chloride (K-DUR) 10 MEQ tablet Take 1 tablet (10 mEq total) by mouth 2 (two) times daily. 04/10/17 07/09/17 Yes Nedrud, Larena Glassman, MD  rivaroxaban (XARELTO) 20 MG TABS tablet Take 1 tablet (20 mg total) by mouth daily with supper. 04/10/17  Yes Nedrud, Larena Glassman, MD  Na Sulfate-K Sulfate-Mg Sulf 17.5-3.13-1.6 GM/180ML SOLN Suprep (no substitutions)-TAKE AS DIRECTED. Patient not taking: Reported on 07/03/2017 07/08/16   Doran Stabler, MD    Physical Exam: BP 120/79   Pulse 77   Temp 98.8 F (37.1 C) (Oral)   Resp 16   Ht 5\' 11"  (1.803 m)   Wt (!) 158.8 kg (350 lb)   SpO2 90%   BMI 48.82 kg/m   General: 57 year old after American male morbidly obese.  Appears uncomfortable due to dyspnea.  Alert and oriented x3. Eyes: Anicteric sclera.  Pupils are round and reactive to light. ENT: Mucous membranes moist with no erythema or exudates Neck: Left JVD noted Cardiovascular: Irregular rate and rhythm with no rubs or gallops. Respiratory: Diffuse rales and wheezes bilaterally Abdomen: Morbidly obese times with normal bowel sounds x4.  Nontender. Skin: Chronic dermatitis stasis in lower extremities Musculoskeletal: Lower extremity edema with chronic stasis dermatitis.  Moves all 4 extremities. Psychiatric: Mood is appropriate for condition and setting Neurologic: Alert and oriented x3.  No focal motor deficit.          Labs on Admission:  Basic Metabolic Panel: Recent Labs  Lab 07/03/17 0525  NA 137  K 3.8  CL 96*  CO2 30  GLUCOSE 132*  BUN 16  CREATININE 0.86  CALCIUM 8.8*  MG 2.2   Liver Function Tests: Recent Labs  Lab 07/03/17 0525  AST 20  ALT 18  ALKPHOS 47  BILITOT 0.4  PROT 7.1    ALBUMIN 3.3*   No results for input(s): LIPASE, AMYLASE in the last 168 hours. No results for input(s): AMMONIA in the last 168 hours. CBC: Recent Labs  Lab 07/03/17 0525  WBC 6.5  NEUTROABS 3.8  HGB 13.5  HCT 41.7  MCV 96.5  PLT 181   Cardiac Enzymes: No results for input(s): CKTOTAL, CKMB, CKMBINDEX, TROPONINI in the last 168 hours.  BNP (last 3 results) Recent Labs    01/28/17 0000 07/03/17 0522  BNP 251.3* 224.8*    ProBNP (last 3 results) Recent Labs    08/05/16 1053 01/07/17 1528  PROBNP 385* 678*    CBG: No results for input(s): GLUCAP in the last 168 hours.  Radiological Exams on Admission: Dg Chest 2 View  Result Date: 07/03/2017 CLINICAL DATA:  Acute onset of shortness of breath. EXAM: CHEST - 2 VIEW COMPARISON:  Chest radiograph performed 08/17/2015 FINDINGS: The lungs are well-aerated. Vascular congestion is noted. Increased interstitial markings raise concern for pulmonary edema. Small bilateral pleural effusions are  seen. There is no evidence of pneumothorax. The heart is mildly enlarged. No acute osseous abnormalities are seen. IMPRESSION: Vascular congestion and mild cardiomegaly. Increased interstitial markings raise concern for pulmonary edema. Small bilateral pleural effusions seen. Electronically Signed   By: Garald Balding M.D.   On: 07/03/2017 06:00    EKG: Independently reviewed.  Personally reviewed EKG revealed A. fib with RVR with rate of 118.  With occasional PVCs.  Assessment/Plan Present on Admission: . Acute on chronic diastolic CHF (congestive heart failure) (HCC)  Active Problems:   Acute on chronic diastolic CHF (congestive heart failure) (HCC)  Acute on chronic diastolic CHF Noncompliance with diet Self-reported increase in salt intake Continue diuresis Continue strict I's and O's and daily weight Continue salt restriction less than 2 g/day Consult cardiology Continue cardiac medications  Acute hypoxic respiratory  failure secondary to acute on chronic diastolic CHF exacerbation in the setting of COPD Chest x-ray done on 07/03/2017 personally reviewed revealed increase in pulmonary vascularity suggestive of pleural edema, cardiomegaly, and small bilateral pleural effusion. Continue to monitor O2 saturation Continue O2 supplementation to maintain O2 saturation 92% or greater Duo nebs every 6 hours and every 2 hours as needed IV Lasix twice daily Repeat chest x-ray on 07/05/2017  Atrial fibrillation with RVR Most likely A. fib exacerbated by pulmonary edema Continue diuresis to improve pulmonary edema Monitor urine output Continue rate control medications Continue Xarelto for CVA prophylaxis  Pulmonary edema, cardiogenic Order 2D echo Continue diuresis Continue O2 supplementation to maintain O2 saturation 92% or greater Repeat x-ray in 48 hours 07/05/17  OSA not on CPAP Order CPAP to be used at night  Hypertension Continue home medications IV hydralazine as needed 10 mg for systolic blood pressure greater than 883 or diastolic blood pressure greater than 105 Continue to monitor vital signs  Morbid obesity Recommend weight loss outpatient Recommend daily weight  History of hepatitis C Normal LFTs on admission  COPD Duo nebs every 6 hours and every 2 hours as needed  Tobacco use disorder Tobacco cessation counseling done at bedside    DVT prophylaxis: Xarelto  Code Status: Full code  Family Communication: None at bedside.  Patient lives with his uncle.  Disposition Plan: Admit to telemetry  Consults called: Consult cardiology  Admission status: Inpatient    Kayleen Memos MD [facility] Pager [phone]  If 7PM-7AM, please contact night-coverage [email or web address] Password [facility]  07/03/2017, 7:51 AM

## 2017-07-03 NOTE — Consult Note (Signed)
Cardiology Consultation:   Patient ID: Frank Morales; [id number]; 11-Aug-1960   Admit date: 07/03/2017 Date of Consult: 07/03/2017  Primary Care Provider: Thomasene Ripple, MD Primary Cardiologist: Dorris Carnes, MD  Primary Electrophysiologist:  NA   Patient Profile:   Frank Morales is a 57 y.o. male with a hx of chronic diastolic HF, permanent  atrial flutter with failed DCCV now rate control on Xarelto for CHAD2DS2VASc of 2, OSA, cath 2017 without CAD who is being seen today for the evaluation of diastolic heart failure at the request of Dr. Berneice Gandy.  History of Present Illness:   Mr. Castagna has a hx of chronic diastolic HF, permanent atrial flutter with failed DCCV now rate control on Xarelto for CHAD2DS2VASc of 2, OSA, cath 2017 without CAD, HTN and tobacco use now admitted 07/03/17 due to increased SOB, wheezes and edema in both legs, presented to ER by EMS.  On admit pt did state he had more salt recently.  He stated it began about 3 weeks ago.  He has been eating soup.  He never called office that edema was present.  He does not have scales.   Echo in 2017 EF 50-55% mild concentric hypertrophy.  Trivial ME, Rt and Lt atrium severely dilated, mild TR and PA pk pressure 37 mmHg.   Cardiac cath 08/2015 with Angiographically minimal coronary artery disease. Large draping vessels. Several very tortuous,  Mildly elevated secondary pulmonary hypertension.  Mildly elevated signal April medication for elevated LVEDP. Suggest more diastolic dysfunction.  EKG:  The EKG was personally reviewed and demonstrates:  A fib rate 118 with freq PVCs no acute ST changes. Telemetry:  Telemetry was personally reviewed and demonstrates:  A fib, less PVCs  Na 137, K+ 3.8, Cr 0.86, Ca+8.8, mg+ 2.2,  BNP 224 Troponin poc 0.00 hgb 13.5, Hct 41.7, plts 181   2V CXR : Vascular congestion and mild cardiomegaly. Increased interstitial markings raise concern for pulmonary edema. Small bilateral pleural  effusions seen.  Has rc'd lasix 40 mg IV   Currently feeling better, can rest back further than originally.  Has severe edema.  Is putting out urine at least 1500 cc.  No chest pain. Continues to smoke.  Past Medical History:  Diagnosis Date  . Atrial fibrillation and flutter ([place]) 08/17/2015   A. S/p failed DCCV // b. Severe BAE on echo >> rate control strategy (has seen AF clinic) // c. Xarelto for anticoag (CHADS2-VASc=2 / CHF, HTN)  . Chronic diastolic CHF (congestive heart failure) ([place]) 08/30/2015   A. Echo 6/17: Apical HK, moderate focal basal and mild concentric LVH, EF 50-55, diffuse HK, trivial MR, severe BAE, mild TR, PASP 37  . Hepatitis C, chronic ([place]) 08/19/2015  . History of cardiac catheterization    a. LHC 6/17: LAD irregs, o/w no CAD  . OSA (obstructive sleep apnea) 11/21/2015  . Sleep apnea   . Tobacco abuse     Past Surgical History:  Procedure Laterality Date  . CARDIAC CATHETERIZATION N/A 08/21/2015   Procedure: Right/Left Heart Cath and Coronary Angiography;  Surgeon: Leonie Man, MD;  Location: [place] [facility];  Service: Cardiovascular;  Laterality: N/A;  . CARDIOVERSION N/A 09/22/2015   Procedure: CARDIOVERSION;  Surgeon: Josue Hector, MD;  Location: [facility] ENDOSCOPY;  Service: Cardiovascular;  Laterality: N/A;  . COLONOSCOPY N/A 07/19/2016   Procedure: COLONOSCOPY;  Surgeon: Doran Stabler, MD;  Location: [facility] ENDOSCOPY;  Service: Gastroenterology;  Laterality: N/A;  . ESOPHAGOGASTRODUODENOSCOPY N/A 07/19/2016  Procedure: ESOPHAGOGASTRODUODENOSCOPY (EGD);  Surgeon: Doran Stabler, MD;  Location: [facility] ENDOS[facility];  Service: Gastroenterology;  Laterality: N/A;  . NO PAST SURGERIES       Home Medications:  Prior to Admission medications   Medication Sig Start Date End Date Taking? Authorizing Provider  diltiazem (CARTIA XT) 240 MG 24 hr capsule Take 1 capsule (240 mg total) by mouth daily. MEDICAID [id number] P 06/24/17  Yes Nedrud, Larena Glassman, MD  fluticasone  (FLONASE) 50 MCG/ACT nasal spray Place 1 spray into both nostrils daily. 04/10/17  Yes Nedrud, Larena Glassman, MD  furosemide (LASIX) 40 MG tablet Take 2 tablets (80 mg total) by mouth 2 (two) times daily. 04/10/17  Yes Fay Records, MD  loratadine (CLARITIN) 10 MG tablet Take 1 tablet (10 mg total) by mouth daily. 03/03/17  Yes NedrudLarena Glassman, MD  nicotine polacrilex (NICORETTE) 2 MG gum Take 1 each (2 mg total) by mouth as needed for smoking cessation. PHARMACIST PLS COUNSEL, DISPENSE BRAND COVERED BY MEDICAID 04/10/17  Yes Nedrud, Larena Glassman, MD  potassium chloride (K-DUR) 10 MEQ tablet Take 1 tablet (10 mEq total) by mouth 2 (two) times daily. 04/10/17 07/09/17 Yes Nedrud, Larena Glassman, MD  rivaroxaban (XARELTO) 20 MG TABS tablet Take 1 tablet (20 mg total) by mouth daily with supper. 04/10/17  Yes Nedrud, Larena Glassman, MD  Na Sulfate-K Sulfate-Mg Sulf 17.5-3.13-1.6 GM/180ML SOLN Suprep (no substitutions)-TAKE AS DIRECTED. Patient not taking: Reported on 07/03/2017 07/08/16   Doran Stabler, MD    Inpatient Medications: Scheduled Meds: . diltiazem  240 mg Oral Daily  . furosemide  40 mg Intravenous BID  . ipratropium-albuterol  3 mL Nebulization Q6H  . methylPREDNISolone (SOLU-MEDROL) injection  40 mg Intravenous Daily  . rivaroxaban  20 mg Oral Q supper   Continuous Infusions:  PRN Meds: ipratropium-albuterol  Allergies:    Allergies  Allergen Reactions  . Lactose Intolerance (Gi) Nausea And Vomiting    Social History:   Social History   Socioeconomic History  . Marital status: Single    Spouse name: Not on file  . Number of children: Not on file  . Years of education: Not on file  . Highest education level: Not on file  Occupational History  . Occupation: unemployed    Comment: Previous Soil scientist  . Financial resource strain: Not on file  . Food insecurity:    Worry: Not on file    Inability: Not on file  . Transportation needs:    Medical: Not on file    Non-medical:  Not on file  Tobacco Use  . Smoking status: Current Every Day Smoker    Packs/day: 0.20    Types: Cigarettes  . Smokeless tobacco: Never Used  . Tobacco comment: down to 1-2 cig/day with NRT  Substance and Sexual Activity  . Alcohol use: Yes    Alcohol/week: 3.0 oz    Types: 5 Standard drinks or equivalent per week    Comment: 2 times a week beer  . Drug use: No  . Sexual activity: Not on file  Lifestyle  . Physical activity:    Days per week: Not on file    Minutes per session: Not on file  . Stress: Not on file  Relationships  . Social connections:    Talks on phone: Not on file    Gets together: Not on file    Attends religious service: Not on file    Active member of club or organization: Not on file  Attends meetings of clubs or organizations: Not on file    Relationship status: Not on file  . Intimate partner violence:    Fear of current or ex partner: Not on file    Emotionally abused: Not on file    Physically abused: Not on file    Forced sexual activity: Not on file  Other Topics Concern  . Not on file  Social History Narrative  . Not on file    Family History:    Family History  Problem Relation Age of Onset  . Hypertension Mother   . Diabetes Mother   . Pneumonia Father   . Hypertension Maternal Grandfather   . Colon cancer Neg Hx      ROS:  Please see the history of present illness.  General:no colds or fevers, + weight increase Skin:no rashes or ulcers HEENT:no blurred vision, no congestion CV:see HPI PUL:see HPI GI:no diarrhea constipation or melena, no indigestion GU:no hematuria, no dysuria MS:no joint pain, no claudication Neuro:no syncope, no lightheadedness Endo:no diabetes, no thyroid disease  All other ROS reviewed and negative.     Physical Exam/Data:   Vitals:   07/03/17 0457 07/03/17 0500 07/03/17 0637 07/03/17 0644  BP: 128/69 134/79 120/79 120/79  Pulse: (!) 101 (!) 116 77   Resp: 18 16 16    Temp:      TempSrc:        SpO2: 99% 98% 90%   Weight:      Height:       No intake or output data in the 24 hours ending 07/03/17 0916 Filed Weights   07/03/17 0343  Weight: (!) 350 lb (158.8 kg)   Body mass index is 48.82 kg/m.  General:  Well nourished, well developed, acute distress has improved.  Can rest back further now.  HEENT: normal Lymph: no adenopathy Neck: + JVD Endocrine:  No thryomegaly Vascular: No carotid bruits;  ? pedal pulses bilaterally due to edema  Cardiac:  irreg irreg; no murmur gallup rub or click Lungs:  Breath sounds to auscultation bilaterally, + wheezing, rhonchi + rales diminished breath sounds thourghout Abd: tissue is tight, tr pitting edema from fluid, nontender, no hepatomegaly much larger than usual per pt.  Ext: 3-4+ edema to hips, knees to feet very tight.   Musculoskeletal:  No deformities, BUE and BLE strength normal and equal Skin: warm and dry  Neuro:  Alert and oriented X 3 MAE follows commands , no focal abnormalities noted Psych:  Normal affect    Relevant CV Studies: Echo in 2017 EF 50-55% mild concentric hypertrophy.  Trivial ME, Rt and Lt atrium severely dilated, mild TR and PA pk pressure 37 mmHg.   Cardiac cath 08/2015 with Angiographically minimal coronary artery disease. Large draping vessels. Several very tortuous,  Mildly elevated secondary pulmonary hypertension.  Mildly elevated signal April medication for elevated LVEDP. Suggest more diastolic dysfunction.   Laboratory Data:  Chemistry Recent Labs  Lab 07/03/17 0525  NA 137  K 3.8  CL 96*  CO2 30  GLUCOSE 132*  BUN 16  CREATININE 0.86  CALCIUM 8.8*  GFRNONAA >60  GFRAA >60  ANIONGAP 11    Recent Labs  Lab 07/03/17 0525  PROT 7.1  ALBUMIN 3.3*  AST 20  ALT 18  ALKPHOS 47  BILITOT 0.4   Hematology Recent Labs  Lab 07/03/17 0525  WBC 6.5  RBC 4.32  HGB 13.5  HCT 41.7  MCV 96.5  MCH 31.3  MCHC 32.4  RDW 14.5  PLT 181   Cardiac EnzymesNo results for input(s):  TROPONINI in the last 168 hours.  Recent Labs  Lab 07/03/17 0532  TROPIPOC 0.00    BNP Recent Labs  Lab 07/03/17 0522  BNP 224.8*    DDimer No results for input(s): DDIMER in the last 168 hours.  Radiology/Studies:  Dg Chest 2 View  Result Date: 07/03/2017 CLINICAL DATA:  Acute onset of shortness of breath. EXAM: CHEST - 2 VIEW COMPARISON:  Chest radiograph performed 08/17/2015 FINDINGS: The lungs are well-aerated. Vascular congestion is noted. Increased interstitial markings raise concern for pulmonary edema. Small bilateral pleural effusions are seen. There is no evidence of pneumothorax. The heart is mildly enlarged. No acute osseous abnormalities are seen. IMPRESSION: Vascular congestion and mild cardiomegaly. Increased interstitial markings raise concern for pulmonary edema. Small bilateral pleural effusions seen. Electronically Signed   By: Garald Balding M.D.   On: 07/03/2017 06:00    Assessment and Plan:   Acute on chronic diastolic HF most likely related to increase of salt intake - is urinating.  Would increase Lasix to 40 TID at this point.   Monitor K+ and Cr. Discussed calling our office first week of increased edema.  Continue po dilt.  Dr. Oval Linsey to see.   A fib with RVR due to HF - Permanent a fib on Xarelto with CHAD2S2Vasc score 2, is slowing down continue xarelto  Tobacco use, discussed stopping.     For questions or updates, please contact [place] Please consult [email or web address] for contact info under Cardiology/STEMI.   Signed, Cecilie Kicks, NP  07/03/2017 9:16 AM

## 2017-07-03 NOTE — ED Notes (Signed)
Per [place], [place], second EKG not needed.

## 2017-07-03 NOTE — ED Provider Notes (Addendum)
[facility] DEPT Provider Note   CSN: [id number] Arrival date & time: 07/03/17  0331     History   Chief Complaint Chief Complaint  Patient presents with  . Shortness of Breath    HPI Frank Morales is a 57 y.o. male medical history of atrial fibrillation, chronic diastolic heart failure, chronic hep C, tobacco abuse, morbid obesity and obstructive sleep apnea who presents with shortness of breath.  Patient was noted to be in A. fib upon arrival.  He has chronic atrial fibrillation and is currently on anticoagulation and rate control medications.  Patient states that over the past 2 weeks he has had progressively worsening swelling in his abdomen and legs.  He has become extremely short of breath with associated orthopnea and PND.  He also has exertional dyspnea without chest pain.  Is a previous heart catheterization that shows no coronary artery disease.  Patient denies fevers, chills or other signs or symptoms of systemic infection.  HPI  Past Medical History:  Diagnosis Date  . Atrial fibrillation and flutter ([place]) 08/17/2015   A. S/p failed DCCV // b. Severe BAE on echo >> rate control strategy (has seen AF clinic) // c. Xarelto for anticoag (CHADS2-VASc=2 / CHF, HTN)  . Chronic diastolic CHF (congestive heart failure) ([place]) 08/30/2015   A. Echo 6/17: Apical HK, moderate focal basal and mild concentric LVH, EF 50-55, diffuse HK, trivial MR, severe BAE, mild TR, PASP 37  . Hepatitis C, chronic ([place]) 08/19/2015  . History of cardiac catheterization    a. LHC 6/17: LAD irregs, o/w no CAD  . OSA (obstructive sleep apnea) 11/21/2015  . Sleep apnea   . Tobacco abuse     Patient Active Problem List   Diagnosis Date Noted  . Sinus congestion 03/03/2017  . Chronic dental pain 08/19/2016  . Rectal bleeding   . Grade I internal hemorrhoids   . 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 Contact dermatitis due to poison ivy 02/26/2016  . OSA (obstructive sleep apnea)  11/21/2015  . Chronic diastolic CHF (congestive heart failure) ([place]) 08/30/2015  . Hepatitis C, chronic ([place]) 08/19/2015  . Severe obesity (BMI >= 40) ([place]) 08/17/2015  . Hypertension 08/17/2015  . Bilateral leg edema 08/17/2015  . Atrial fibrillation and flutter ([place]) 08/17/2015  . Current smoker 08/17/2015    Past Surgical History:  Procedure Laterality Date  . CARDIAC CATHETERIZATION N/A 08/21/2015   Procedure: Right/Left Heart Cath and Coronary Angiography;  Surgeon: Leonie Man, MD;  Location: [place] [facility] LAB;  Service: Cardiovascular;  Laterality: N/A;  . CARDIOVERSION N/A 09/22/2015   Procedure: CARDIOVERSION;  Surgeon: Josue Hector, MD;  Location: [facility] ENDOSCOPY;  Service: Cardiovascular;  Laterality: N/A;  . COLONOSCOPY N/A 07/19/2016   Procedure: COLONOSCOPY;  Surgeon: Doran Stabler, MD;  Location: [facility] ENDOSCOPY;  Service: Gastroenterology;  Laterality: N/A;  . ESOPHAGOGASTRODUODENOSCOPY N/A 07/19/2016   Procedure: ESOPHAGOGASTRODUODENOSCOPY (EGD);  Surgeon: Doran Stabler, MD;  Location: [facility] ENDOSCOPY;  Service: Gastroenterology;  Laterality: N/A;  . NO PAST SURGERIES          Home Medications    Prior to Admission medications   Medication Sig Start Date End Date Taking? Authorizing Provider  diltiazem (CARTIA XT) 240 MG 24 hr capsule Take 1 capsule (240 mg total) by mouth daily. MEDICAID [id number] P 06/24/17  Yes Nedrud, Larena Glassman, MD  fluticasone (FLONASE) 50 MCG/ACT nasal spray Place 1 spray into both nostrils daily. 04/10/17  Yes Thomasene Ripple, MD  furosemide (LASIX) 40 MG tablet Take 2 tablets (80 mg total) by mouth 2 (two) times daily. 04/10/17  Yes Fay Records, MD  loratadine (CLARITIN) 10 MG tablet Take 1 tablet (10 mg total) by mouth daily. 03/03/17  Yes NedrudLarena Glassman, MD  nicotine polacrilex (NICORETTE) 2 MG gum Take 1 each (2 mg total) by mouth as needed for smoking cessation. PHARMACIST PLS COUNSEL, DISPENSE BRAND COVERED BY MEDICAID 04/10/17  Yes  Nedrud, Larena Glassman, MD  potassium chloride (K-DUR) 10 MEQ tablet Take 1 tablet (10 mEq total) by mouth 2 (two) times daily. 04/10/17 07/09/17 Yes Nedrud, Larena Glassman, MD  rivaroxaban (XARELTO) 20 MG TABS tablet Take 1 tablet (20 mg total) by mouth daily with supper. 04/10/17  Yes Nedrud, Larena Glassman, MD  Na Sulfate-K Sulfate-Mg Sulf 17.5-3.13-1.6 GM/180ML SOLN Suprep (no substitutions)-TAKE AS DIRECTED. Patient not taking: Reported on 07/03/2017 07/08/16   Doran Stabler, MD    Family History Family History  Problem Relation Age of Onset  . Hypertension Mother   . Diabetes Mother   . Pneumonia Father   . Hypertension Maternal Grandfather   . Colon cancer Neg Hx     Social History Social History   Tobacco Use  . Smoking status: Current Every Day Smoker    Packs/day: 0.20    Types: Cigarettes  . Smokeless tobacco: Never Used  . Tobacco comment: down to 1-2 cig/day with NRT  Substance Use Topics  . Alcohol use: Yes    Alcohol/week: 3.0 oz    Types: 5 Standard drinks or equivalent per week    Comment: 2 times a week beer  . Drug use: No     Allergies   Lactose intolerance (gi)   Review of Systems Review of Systems  Ten systems reviewed and are negative for acute change, except as noted in the HPI.   Physical Exam Updated Vital Signs BP 120/79   Pulse 77   Temp 98.8 F (37.1 C) (Oral)   Resp 16   Ht 5\' 11"  (1.803 m)   Wt (!) 158.8 kg (350 lb)   SpO2 90%   BMI 48.82 kg/m   Physical Exam  Constitutional: He appears well-developed and well-nourished. No distress.  HENT:  Head: Normocephalic and atraumatic.  Eyes: Pupils are equal, round, and reactive to light. Conjunctivae and EOM are normal. No scleral icterus.  Neck: Normal range of motion. Neck supple.  Cardiovascular: Normal rate, regular rhythm and normal heart sounds.  Pulmonary/Chest: Effort normal. No respiratory distress. He has wheezes in the right lower field and the left lower field. He has rales in the  right lower field and the left lower field.  Abdominal: Soft. There is no tenderness.  Musculoskeletal:       Right lower leg: He exhibits tenderness and edema.       Left lower leg: He exhibits tenderness and edema.  Bilateral peripheral edema, 3+ pitting with thickened, woody changes of chronic edema.  Neurological: He is alert.  Skin: Skin is warm and dry. He is not diaphoretic.  Psychiatric: His behavior is normal.  Nursing note and vitals reviewed.    ED Treatments / Results  Labs (all labs ordered are listed, but only abnormal results are displayed) Labs Reviewed  COMPREHENSIVE METABOLIC PANEL - Abnormal; Notable for the following components:      Result Value   Chloride 96 (*)    Glucose, Bld 132 (*)    Calcium 8.8 (*)    Albumin 3.3 (*)  All other components within normal limits  BRAIN NATRIURETIC PEPTIDE - Abnormal; Notable for the following components:   B Natriuretic Peptide 224.8 (*)    All other components within normal limits  CBC WITH DIFFERENTIAL/PLATELET  MAGNESIUM  I-STAT TROPONIN, ED    EKG EKG Interpretation  Date/Time:  Thursday July 03 2017 03:41:35 EDT Ventricular Rate:  118 PR Interval:    QRS Duration: 102 QT Interval:  346 QTC Calculation: 485 R Axis:   150 Text Interpretation:  Atrial fibrillation with PVCs Right axis deviation Borderline prolonged QT interval PVCs not seen previously Confirmed by Shanon Rosser [phone]) on 07/03/2017 7:10:37 AM   Radiology Dg Chest 2 View  Result Date: 07/03/2017 CLINICAL DATA:  Acute onset of shortness of breath. EXAM: CHEST - 2 VIEW COMPARISON:  Chest radiograph performed 08/17/2015 FINDINGS: The lungs are well-aerated. Vascular congestion is noted. Increased interstitial markings raise concern for pulmonary edema. Small bilateral pleural effusions are seen. There is no evidence of pneumothorax. The heart is mildly enlarged. No acute osseous abnormalities are seen. IMPRESSION: Vascular congestion and mild  cardiomegaly. Increased interstitial markings raise concern for pulmonary edema. Small bilateral pleural effusions seen. Electronically Signed   By: Garald Balding M.D.   On: 07/03/2017 06:00    Procedures Procedures (including critical care time)  Medications Ordered in ED Medications  diltiazem (CARDIZEM) tablet 30 mg (0 mg Oral Hold 07/03/17 0644)  furosemide (LASIX) injection 40 mg (40 mg Intravenous Given 07/03/17 0645)     Initial Impression / Assessment and Plan / ED Course  I have reviewed the triage vital signs and the nursing notes.  Pertinent labs & imaging results that were available during my care of the patient were reviewed by me and considered in my medical decision making (see chart for details).    EKG and labs reviewed. Non ischemic EKG shows afib. Rate controlled.   Patient with CHF exacerbation.  He is placed on 40 g IV Lasix with strict in take and output.  Patient will be admitted by the hospitalist service.  No hypoxia.  No evidence of ACS.   Final Clinical Impressions(s) / ED Diagnoses   Final diagnoses:  Acute on chronic combined systolic and diastolic congestive heart failure [facility])    ED Discharge Orders        Ordered    Amb referral to AFIB Clinic     07/03/17 Ko Olina, Betty Brooks, PA-C 07/03/17 0708    Mol[place]us, Jenny Reichmann, MD 07/03/17 4081    Margarita Mail, PA-C 07/03/17 1619

## 2017-07-03 NOTE — ED Triage Notes (Signed)
Pt called EMS with increase of shortness of breath, he was wheezing in all fields, he also has bilateral edema in both lungs and a dry cough Pt complains of pain in his legs and abdomen

## 2017-07-03 NOTE — Progress Notes (Signed)
Pt refused cpap

## 2017-07-03 NOTE — ED Provider Notes (Signed)
EKG Interpretation  Date/Time:  Thursday July 03 2017 03:41:35 EDT Ventricular Rate:  118 PR Interval:    QRS Duration: 102 QT Interval:  346 QTC Calculation: 485 R Axis:   150 Text Interpretation:  Atrial fibrillation with PVCs Right axis deviation Borderline prolonged QT interval PVCs not seen previously Confirmed by Shanon Rosser [phone]) on 07/03/2017 7:10:37 AM         Doryan Bahl, Jenny Reichmann, MD 07/03/17 [phone]

## 2017-07-04 ENCOUNTER — Inpatient Hospital Stay (HOSPITAL_COMMUNITY): Payer: Medicaid Other

## 2017-07-04 DIAGNOSIS — G4733 Obstructive sleep apnea (adult) (pediatric): Secondary | ICD-10-CM

## 2017-07-04 DIAGNOSIS — I4821 Permanent atrial fibrillation: Secondary | ICD-10-CM

## 2017-07-04 DIAGNOSIS — I509 Heart failure, unspecified: Secondary | ICD-10-CM

## 2017-07-04 DIAGNOSIS — Z716 Tobacco abuse counseling: Secondary | ICD-10-CM

## 2017-07-04 LAB — CBC
HCT: 43.9 % (ref 39.0–52.0)
Hemoglobin: 13.7 g/dL (ref 13.0–17.0)
MCH: 30.4 pg (ref 26.0–34.0)
MCHC: 31.2 g/dL (ref 30.0–36.0)
MCV: 97.6 fL (ref 78.0–100.0)
Platelets: 210 10*3/uL (ref 150–400)
RBC: 4.5 MIL/uL (ref 4.22–5.81)
RDW: 14.6 % (ref 11.5–15.5)
WBC: 9.3 10*3/uL (ref 4.0–10.5)

## 2017-07-04 LAB — BASIC METABOLIC PANEL
Anion gap: 9 (ref 5–15)
BUN: 12 mg/dL (ref 6–20)
CALCIUM: 8.7 mg/dL — AB (ref 8.9–10.3)
CO2: 35 mmol/L — ABNORMAL HIGH (ref 22–32)
CREATININE: 0.79 mg/dL (ref 0.61–1.24)
Chloride: 94 mmol/L — ABNORMAL LOW (ref 101–111)
GFR calc non Af Amer: >60 mL/min (ref >60)
Glucose, Bld: 139 mg/dL — ABNORMAL HIGH (ref 65–99)
Potassium: 4.1 mmol/L (ref 3.5–5.1)
SODIUM: 138 mmol/L (ref 135–145)

## 2017-07-04 LAB — ECHOCARDIOGRAM COMPLETE
Height: 71 in
WEIGHTICAEL: 6006.4 [oz_av]

## 2017-07-04 MED ORDER — FUROSEMIDE 10 MG/ML IJ SOLN
60.0000 mg | Freq: Three times a day (TID) | INTRAMUSCULAR | Status: DC
  Administered 2017-07-04 – 2017-07-09 (×15): 60 mg via INTRAVENOUS
  Filled 2017-07-04 (×16): qty 6

## 2017-07-04 MED ORDER — PERFLUTREN LIPID MICROSPHERE
1.0000 mL | INTRAVENOUS | Status: AC | PRN
  Administered 2017-07-04: 3 mL via INTRAVENOUS
  Filled 2017-07-04: qty 10

## 2017-07-04 MED ORDER — FUROSEMIDE 10 MG/ML IJ SOLN
20.0000 mg | Freq: Once | INTRAMUSCULAR | Status: AC
  Administered 2017-07-04: 20 mg via INTRAVENOUS
  Filled 2017-07-04: qty 2

## 2017-07-04 NOTE — Progress Notes (Signed)
Progress Note  Patient Name: Frank Morales Date of Encounter: 07/04/2017  Primary Cardiologist: Dorris Carnes, MD   Subjective   + SOB last pm, currently lying on stomach flat.  No chest pain.   Inpatient Medications    Scheduled Meds: . diltiazem  240 mg Oral Daily  . furosemide  40 mg Intravenous Q8H  . ipratropium-albuterol  3 mL Nebulization Q6H  . methylPREDNISolone (SOLU-MEDROL) injection  40 mg Intravenous Daily  . nicotine  14 mg Transdermal Daily  . rivaroxaban  20 mg Oral Q supper   Continuous Infusions:  PRN Meds: ipratropium-albuterol   Vital Signs    Vitals:   07/03/17 2138 07/04/17 0130 07/04/17 0619 07/04/17 0821  BP: 132/90  128/81   Pulse: 70  (!) 107   Resp: 18  (!) 22   Temp: 98.1 F (36.7 C)  98.1 F (36.7 C)   TempSrc: Oral  Oral   SpO2: 96% 91% 95% 96%  Weight:   (!) 375 lb 6.4 oz (170.3 kg)   Height:        Intake/Output Summary (Last 24 hours) at 07/04/2017 0822 Last data filed at 07/04/2017 0820 Gross per 24 hour  Intake 600 ml  Output 3675 ml  Net -3075 ml   Filed Weights   07/03/17 0343 07/03/17 1540 07/04/17 0619  Weight: (!) 350 lb (158.8 kg) (!) 378 lb 8 oz (171.7 kg) (!) 375 lb 6.4 oz (170.3 kg)    Telemetry    A fib with RVR at times, + PVCs - Personally Reviewed  ECG    No new - Personally Reviewed  Physical Exam   GEN: No acute distress.   Neck: No JVD Cardiac: irreg irreg, no murmurs, rubs, or gallops.  Respiratory: breath sounds to auscultation bilaterally though diminished and with wheezes. GI: Soft, nontender, non-distended  MS: +++ edema of legs and abd; No deformity. Neuro:  Nonfocal  Psych: Normal affect   Labs    Chemistry Recent Labs  Lab 07/03/17 0525 07/04/17 0417  NA 137 138  K 3.8 4.1  CL 96* 94*  CO2 30 35*  GLUCOSE 132* 139*  BUN 16 12  CREATININE 0.86 0.79  CALCIUM 8.8* 8.7*  PROT 7.1  --   ALBUMIN 3.3*  --   AST 20  --   ALT 18  --   ALKPHOS 47  --   BILITOT 0.4  --     GFRNONAA >60 >60  GFRAA >60 >60  ANIONGAP 11 9     Hematology Recent Labs  Lab 07/03/17 0525 07/04/17 0417  WBC 6.5 9.3  RBC 4.32 4.50  HGB 13.5 13.7  HCT 41.7 43.9  MCV 96.5 97.6  MCH 31.3 30.4  MCHC 32.4 31.2  RDW 14.5 14.6  PLT 181 210    Cardiac EnzymesNo results for input(s): TROPONINI in the last 168 hours.  Recent Labs  Lab 07/03/17 0532  TROPIPOC 0.00     BNP Recent Labs  Lab 07/03/17 0522  BNP 224.8*     DDimer No results for input(s): DDIMER in the last 168 hours.   Radiology    Dg Chest 2 View  Result Date: 07/03/2017 CLINICAL DATA:  Acute onset of shortness of breath. EXAM: CHEST - 2 VIEW COMPARISON:  Chest radiograph performed 08/17/2015 FINDINGS: The lungs are well-aerated. Vascular congestion is noted. Increased interstitial markings raise concern for pulmonary edema. Small bilateral pleural effusions are seen. There is no evidence of pneumothorax. The heart is mildly enlarged.  No acute osseous abnormalities are seen. IMPRESSION: Vascular congestion and mild cardiomegaly. Increased interstitial markings raise concern for pulmonary edema. Small bilateral pleural effusions seen. Electronically Signed   By: Garald Balding M.D.   On: 07/03/2017 06:00    Cardiac Studies   Echo pending   Echo in 16-Jun-2015 EF 50-55% mild concentric hypertrophy.  Trivial ME, Rt and Lt atrium severely dilated, mild TR and PA pk pressure 37 mmHg.   Cardiac cath 08/2015 with Angiographically minimal coronary artery disease. Large draping vessels. Several very tortuous,  Mildly elevated secondary pulmonary hypertension.  Mildly elevated signal April medication for elevated LVEDP. Suggest more diastolic dysfunction.     Patient Profile     57 y.o. male with a hx of chronic diastolic HF, permanent  atrial flutter with failed DCCV now rate control on Xarelto for CHAD2DS2VASc of 2, OSA, cath 06-16-15 without CAD now admitted for acute diastolic HF.  Admitted to increased salt intake.       Assessment & Plan    Acute on chronic diastolic HF most likely related to increase of salt intake  -- neg 3075 and wt down 3 lbs from 378 to 375, though he had started diuresing prior to correct wt being placed.  --lasix 40 mg every 8 hours. ? Increase vs adding metolazone --Cr. 0.79  --troponin neg.    A fib with RVR due to HF  Tobacco use discussed stopping    For questions or updates, please contact [place] Please consult [email or web address] for contact info under Cardiology/STEMI.      Signed, Cecilie Kicks, NP  07/04/2017, 8:22 AM

## 2017-07-04 NOTE — Progress Notes (Signed)
  Echocardiogram 2D Echocardiogram with definity has been performed.  Darlina Sicilian M 07/04/2017, 10:29 AM

## 2017-07-04 NOTE — Progress Notes (Signed)
PROGRESS NOTE    Frank Morales  FBP:[id number] DOB: 05-Jul-1960 DOA: 07/03/2017 PCP: Thomasene Ripple, MD    Brief Narrative:  [name] is a 57 year old male who presents with progressively worsening shortness of breath x 3-4 weeks, past medical history significant for chronic diastolic congestive heart failure, chronic atrial fibrillation, hypertension, statis dermatitis, obstructive sleep apnea, morbid obesity, tobacco dependence and medical noncompliance. States being compliant on diuretics but reports consuming a lot of salt. Also reports orthopnea and nonproductive cough. CXR upon admission demonstrated cardiomegaly suggestive of pulmonary edema with small bilateral pleural effusions. EKG showed atrial fibrillation. BNP 224.8, Troponin negative. He was admitted with a working diagnosis of acute on chronic diastolic congestive heart failure.   Assessment & Plan:   Acute on chronic diastolic congestive heart failure -Possibly due to increased salt intake, patient reports eating a lot of soup recently. -Weight down 3 lbs since yesterday, net output in last 24 hours 3315 ml. -Increased IV Lasix to 60mg  TID, since patient continues to be fluid overloaded and creatinine stable at 0.79, potassium stable at 4.1. -Continue salt restriction, strict I&O's and daily weights. -Cardiology on board and much appreciated. -Echo completed today, pending results.  Acute hypoxic respiratory failure -Continue duo nebs Q6H, and Q2H PRN. -Continue oxygen supplementation to maintain O2 saturation at 92% or greater. -Continue diuresis with lasix, repeat CXR tomorrow.  Atrial Fibrillation -CHAD2S2Vasc score 2. -Continue Xarelto and Diltiazem.  Hypertension -Stable, continue Diltiazem.  Obstructive sleep apnea -Patient denies home use of CPAP. -CPAP ordered.  Tobacco dependence -Nicotine patch.  DVT prophylaxis: Xarelto. Code Status: FULL. Family Communication: None at  bedside. Disposition Plan: Home in 1-2 days when clinically improved.   Consultants:   Cardiology  Procedures:   ECHO 4/19 - results pending.  Antimicrobials:  None.   Subjective: Patient is awake, alert and oriented x 3. Pleasant and interactive. Reports feeling like his stomach feels "tight", shortness of breath improved but still present, reports wheezing, orthopnea and lower leg swelling. Denies any headache, dizziness, change in vision, chest pain/tightness, nausea, vomiting, diarrhea, bruising.   Objective: Vitals:   07/03/17 2138 07/04/17 0130 07/04/17 0619 07/04/17 0821  BP: 132/90  128/81   Pulse: 70  (!) 107   Resp: 18  (!) 22   Temp: 98.1 F (36.7 C)  98.1 F (36.7 C)   TempSrc: Oral  Oral   SpO2: 96% 91% 95% 96%  Weight:   (!) 170.3 kg (375 lb 6.4 oz)   Height:        Intake/Output Summary (Last 24 hours) at 07/04/2017 1137 Last data filed at 07/04/2017 1000 Gross per 24 hour  Intake 900 ml  Output 2550 ml  Net -1650 ml   Filed Weights   07/03/17 0343 07/03/17 1540 07/04/17 0619  Weight: (!) 158.8 kg (350 lb) (!) 171.7 kg (378 lb 8 oz) (!) 170.3 kg (375 lb 6.4 oz)    Examination:  General exam: Appears calm and comfortable. Respiratory system: Breath sounds diminished bilaterally, Bilateral crackles/wheezes mid-lower lobes. Respiratory effort normal. Cardiovascular system: S1 & S2 heard, Irregularly irregular. No JVD, murmurs, rubs, gallops or clicks. +3 edema bilateral lower extremities. Gastrointestinal system: Abdomen is distended, soft and nontender. Normal bowel sounds heard. Central nervous system: Alert and oriented. No focal neurological deficits. Extremities: Moves all four extremities. Skin: Stasis dermatitis present bilateral extremities.  Psychiatry: Judgement and insight appear normal. Mood & affect appropriate.     Data Reviewed: I have personally reviewed following  labs and imaging studies  CBC: Recent Labs  Lab 07/03/17 0525  07/04/17 0417  WBC 6.5 9.3  NEUTROABS 3.8  --   HGB 13.5 13.7  HCT 41.7 43.9  MCV 96.5 97.6  PLT 181 400   Basic Metabolic Panel: Recent Labs  Lab 07/03/17 0525 07/04/17 0417  NA 137 138  K 3.8 4.1  CL 96* 94*  CO2 30 35*  GLUCOSE 132* 139*  BUN 16 12  CREATININE 0.86 0.79  CALCIUM 8.8* 8.7*  MG 2.2  --    GFR: Estimated Creatinine Clearance: 167.2 mL/min (by C-G formula based on SCr of 0.79 mg/dL). Liver Function Tests: Recent Labs  Lab 07/03/17 0525  AST 20  ALT 18  ALKPHOS 47  BILITOT 0.4  PROT 7.1  ALBUMIN 3.3*   No results for input(s): LIPASE, AMYLASE in the last 168 hours. No results for input(s): AMMONIA in the last 168 hours. Coagulation Profile: No results for input(s): INR, PROTIME in the last 168 hours. Cardiac Enzymes: No results for input(s): CKTOTAL, CKMB, CKMBINDEX, TROPONINI in the last 168 hours. BNP (last 3 results) Recent Labs    08/05/16 1053 01/07/17 1528  PROBNP 385* 678*   HbA1C: No results for input(s): HGBA1C in the last 72 hours. CBG: No results for input(s): GLUCAP in the last 168 hours. Lipid Profile: No results for input(s): CHOL, HDL, LDLCALC, TRIG, CHOLHDL, LDLDIRECT in the last 72 hours. Thyroid Function Tests: No results for input(s): TSH, T4TOTAL, FREET4, T3FREE, THYROIDAB in the last 72 hours. Anemia Panel: No results for input(s): VITAMINB12, FOLATE, FERRITIN, TIBC, IRON, RETICCTPCT in the last 72 hours. Sepsis Labs: No results for input(s): PROCALCITON, LATICACIDVEN in the last 168 hours.  No results found for this or any previous visit (from the past 240 hour(s)).       Radiology Studies: Dg Chest 2 View  Result Date: 07/03/2017 CLINICAL DATA:  Acute onset of shortness of breath. EXAM: CHEST - 2 VIEW COMPARISON:  Chest radiograph performed 08/17/2015 FINDINGS: The lungs are well-aerated. Vascular congestion is noted. Increased interstitial markings raise concern for pulmonary edema. Small bilateral pleural  effusions are seen. There is no evidence of pneumothorax. The heart is mildly enlarged. No acute osseous abnormalities are seen. IMPRESSION: Vascular congestion and mild cardiomegaly. Increased interstitial markings raise concern for pulmonary edema. Small bilateral pleural effusions seen. Electronically Signed   By: Garald Balding M.D.   On: 07/03/2017 06:00        Scheduled Meds: . diltiazem  240 mg Oral Daily  . furosemide  40 mg Intravenous Q8H  . methylPREDNISolone (SOLU-MEDROL) injection  40 mg Intravenous Daily  . nicotine  14 mg Transdermal Daily  . rivaroxaban  20 mg Oral Q supper   Continuous Infusions:   LOS: 1 day    Time spent: 25 minutes.    Eloy End, PA-S [facility] Pager [phone]  If 7PM-7AM, please contact night-coverage [email or web address] Password [facility] 07/04/2017, 11:37 AM

## 2017-07-05 DIAGNOSIS — I482 Chronic atrial fibrillation: Secondary | ICD-10-CM

## 2017-07-05 LAB — CBC
HCT: 43.1 % (ref 39.0–52.0)
Hemoglobin: 13.2 g/dL (ref 13.0–17.0)
MCH: 30 pg (ref 26.0–34.0)
MCHC: 30.6 g/dL (ref 30.0–36.0)
MCV: 98 fL (ref 78.0–100.0)
PLATELETS: 201 10*3/uL (ref 150–400)
RBC: 4.4 MIL/uL (ref 4.22–5.81)
RDW: 14.6 % (ref 11.5–15.5)
WBC: 8 10*3/uL (ref 4.0–10.5)

## 2017-07-05 LAB — RENAL FUNCTION PANEL
Albumin: 3.3 g/dL — ABNORMAL LOW (ref 3.5–5.0)
Anion gap: 10 (ref 5–15)
BUN: 16 mg/dL (ref 6–20)
CALCIUM: 8.7 mg/dL — AB (ref 8.9–10.3)
CO2: 38 mmol/L — AB (ref 22–32)
CREATININE: 0.83 mg/dL (ref 0.61–1.24)
Chloride: 93 mmol/L — ABNORMAL LOW (ref 101–111)
GFR calc non Af Amer: >60 mL/min (ref >60)
GLUCOSE: 110 mg/dL — AB (ref 65–99)
Phosphorus: 3.7 mg/dL (ref 2.5–4.6)
Potassium: 3.9 mmol/L (ref 3.5–5.1)
SODIUM: 141 mmol/L (ref 135–145)

## 2017-07-05 LAB — BRAIN NATRIURETIC PEPTIDE: B Natriuretic Peptide: 194.8 pg/mL — ABNORMAL HIGH (ref 0.0–100.0)

## 2017-07-05 NOTE — Progress Notes (Signed)
Progress Note  Patient Name: Frank Morales Date of Encounter: 07/05/2017  Primary Cardiologist: Dorris Carnes, MD   Subjective   Sitting on side of bed no complaints   Inpatient Medications    Scheduled Meds: . diltiazem  240 mg Oral Daily  . furosemide  60 mg Intravenous Q8H  . methylPREDNISolone (SOLU-MEDROL) injection  40 mg Intravenous Daily  . nicotine  14 mg Transdermal Daily  . rivaroxaban  20 mg Oral Q supper   Continuous Infusions:  PRN Meds: ipratropium-albuterol   Vital Signs    Vitals:   07/04/17 1412 07/04/17 2045 07/04/17 2129 07/05/17 0502  BP: (!) 107/55 (!) 126/96  118/89  Pulse: 96 97  (!) 117  Resp: 18 19 20 18   Temp: 98.2 F (36.8 C) 99.2 F (37.3 C)  97.7 F (36.5 C)  TempSrc: Oral Oral  Oral  SpO2: 100% 99%  90%  Weight:    (!) 371 lb 4.8 oz (168.4 kg)  Height:        Intake/Output Summary (Last 24 hours) at 07/05/2017 0913 Last data filed at 07/05/2017 0509 Gross per 24 hour  Intake 1020 ml  Output 3725 ml  Net -2705 ml   Filed Weights   07/03/17 1540 07/04/17 0619 07/05/17 0502  Weight: (!) 378 lb 8 oz (171.7 kg) (!) 375 lb 6.4 oz (170.3 kg) (!) 371 lb 4.8 oz (168.4 kg)    Telemetry    A fib with RVR at times, + PVCs - Personally Reviewed  ECG    4/18 afib rate 113 PVC;s   Physical Exam   BP 118/89 (BP Location: Right Arm)   Pulse (!) 117   Temp 97.7 F (36.5 C) (Oral)   Resp 18   Ht 5\' 11"  (1.803 m)   Wt (!) 371 lb 4.8 oz (168.4 kg)   SpO2 90%   BMI 51.79 kg/m  Affect appropriate Morbidly obese black male  HEENT: normal Neck supple with no adenopathy JVP normal no bruits no thyromegaly Lungs mild exp  wheezing and good diaphragmatic motion Heart:  S1/S2 no murmur, no rub, gallop or click PMI normal Abdomen: benighn, BS positve, no tenderness, no AAA no bruit.  No HSM or HJR Distal pulses intact with no bruits Plus 3 tense bilateral  edema Neuro non-focal Skin warm and dry No muscular weakness   Labs      Chemistry Recent Labs  Lab 07/03/17 0525 07/04/17 0417 07/05/17 0434  NA 137 138 141  K 3.8 4.1 3.9  CL 96* 94* 93*  CO2 30 35* 38*  GLUCOSE 132* 139* 110*  BUN 16 12 16   CREATININE 0.86 0.79 0.83  CALCIUM 8.8* 8.7* 8.7*  PROT 7.1  --   --   ALBUMIN 3.3*  --  3.3*  AST 20  --   --   ALT 18  --   --   ALKPHOS 47  --   --   BILITOT 0.4  --   --   GFRNONAA >60 >60 >60  GFRAA >60 >60 >60  ANIONGAP 11 9 10      Hematology Recent Labs  Lab 07/03/17 0525 07/04/17 0417 07/05/17 0434  WBC 6.5 9.3 8.0  RBC 4.32 4.50 4.40  HGB 13.5 13.7 13.2  HCT 41.7 43.9 43.1  MCV 96.5 97.6 98.0  MCH 31.3 30.4 30.0  MCHC 32.4 31.2 30.6  RDW 14.5 14.6 14.6  PLT 181 210 201    Cardiac EnzymesNo results for input(s): TROPONINI in the  last 168 hours.  Recent Labs  Lab 07/03/17 0532  TROPIPOC 0.00     BNP Recent Labs  Lab 07/03/17 0522 07/05/17 0434  BNP 224.8* 194.8*     DDimer No results for input(s): DDIMER in the last 168 hours.   Radiology    No results found.  Cardiac Studies   Echo EF 50-55% moderate LVH estimated PA 38 mmHg     Patient Profile     57 y.o. male with a hx of chronic diastolic HF, permanent  atrial flutter with failed DCCV now rate control on Xarelto for CHAD2DS2VASc of 2, OSA, cath 2017 without CAD now admitted for acute diastolic HF.  Admitted to increased salt intake.      Assessment & Plan    Acute on chronic diastolic HF:   good diuresis will required at least 3 more days of iv diuresis discussed dietary issue  Afib:  Continue cardizem and xarelto    Smoking:  Cessation discussed     For questions or updates, please contact [place] Please consult [email or web address] for contact info under Cardiology/STEMI.      Signed, Jenkins Rouge, MD  07/05/2017, 9:13 AM

## 2017-07-05 NOTE — Discharge Summary (Signed)
PROGRESS NOTE [facility]   Frank Morales   IWL:[id number] DOB: June 11, 1960  DOA: 07/03/2017 PCP: Thomasene Ripple, MD   Brief Narrative:  Frank Morales is a 57 year old male with medical history of CHF, A. fib, hypertension, OSA, morbidly obese and tobacco abuse presented to the emergency department complaining of shortness of breath for 3-4 weeks prior to admission.  Patient reported compliant with diuretics but has slipped on his diet.  He reported high salt intake.  Upon ED evaluation BMP found to be 224, troponin negative, EKG with A. fib and RVR, chest x-ray shows cardiomegaly and increasing vascular congestion consistent with pulmonary edema and bilateral pleural effusions.  Patient was admitted with working diagnosis of acute on chronic CHF exacerbation  Subjective: Patient seen and examined, his breathing continues to improve, continues to have good urine output.  Denies chest pain, palpitations and dizziness.  Uses CPAP overnight slept much better.  Assessment & Plan:  Acute on chronic diastolic CHF exacerbation Likely from noncompliant with diet.  High salt intake Weight is down 7 pounds since admission, output is negative ~6 L Patient continues to be significantly overload, continue IV Lasix 60 mg q. 8, creatinine stable. Fluid restriction, strict I&O's, daily weights Cardiology recommendations appreciated Echocardiogram shows EF of 50-55%  Acute hypoxic respiratory failure due to CHF exacerbation Treat underlying cause Wean oxygen as able  A. fib with RVR On Xarelto for anticoagulation Rate controlled today continue diltiazem for heart rate control  Hypertension Stable Continue diltiazem  OSA Continue CPAP at night  Tobacco abuse Nicotine patch  Cessation discussed   DVT prophylaxis: Xarelto Code Status: Full code Family Communication: None at bedside Disposition Plan: Home in 2-3 days after fluid overload has improved.  Consultants:    Cardiology  Procedures:   Echocardiogram 4/19  Antimicrobials:  None   Objective: Vitals:   07/04/17 1412 07/04/17 2045 07/04/17 2129 07/05/17 0502  BP: (!) 107/55 (!) 126/96  118/89  Pulse: 96 97  (!) 117  Resp: 18 19 20 18   Temp: 98.2 F (36.8 C) 99.2 F (37.3 C)  97.7 F (36.5 C)  TempSrc: Oral Oral  Oral  SpO2: 100% 99%  90%  Weight:    (!) 168.4 kg (371 lb 4.8 oz)  Height:        Intake/Output Summary (Last 24 hours) at 07/05/2017 1355 Last data filed at 07/05/2017 1101 Gross per 24 hour  Intake 1130 ml  Output 2600 ml  Net -1470 ml   Filed Weights   07/03/17 1540 07/04/17 0619 07/05/17 0502  Weight: (!) 171.7 kg (378 lb 8 oz) (!) 170.3 kg (375 lb 6.4 oz) (!) 168.4 kg (371 lb 4.8 oz)    Examination:  General exam: Appears calm and comfortable  Respiratory system: Decreased breath sounds bilaterally, slightly improved from yesterday, bibasilar crackles.  O2 nasal cannula Cardiovascular system: S1-S2, irregularly regular, positive systolic murmur Gastrointestinal system: Abdomen is nondistended, soft and nontender.  Central nervous system: Alert and oriented. No focal neurological deficits. Extremities: Lower extremity edema 2-3+ bilaterally Skin: Stasis dermatitis in bilateral lower extremity Psychiatry:  Mood & affect appropriate.    Data Reviewed: I have personally reviewed following labs and imaging studies  CBC: Recent Labs  Lab 07/03/17 0525 07/04/17 0417 07/05/17 0434  WBC 6.5 9.3 8.0  NEUTROABS 3.8  --   --   HGB 13.5 13.7 13.2  HCT 41.7 43.9 43.1  MCV 96.5 97.6 98.0  PLT 181 210 174   Basic Metabolic Panel:  Recent Labs  Lab 07/03/17 0525 07/04/17 0417 07/05/17 0434  NA 137 138 141  K 3.8 4.1 3.9  CL 96* 94* 93*  CO2 30 35* 38*  GLUCOSE 132* 139* 110*  BUN 16 12 16   CREATININE 0.86 0.79 0.83  CALCIUM 8.8* 8.7* 8.7*  MG 2.2  --   --   PHOS  --   --  3.7   GFR: Estimated Creatinine Clearance: 160 mL/min (by C-G formula  based on SCr of 0.83 mg/dL). Liver Function Tests: Recent Labs  Lab 07/03/17 0525 07/05/17 0434  AST 20  --   ALT 18  --   ALKPHOS 47  --   BILITOT 0.4  --   PROT 7.1  --   ALBUMIN 3.3* 3.3*   No results for input(s): LIPASE, AMYLASE in the last 168 hours. No results for input(s): AMMONIA in the last 168 hours. Coagulation Profile: No results for input(s): INR, PROTIME in the last 168 hours. Cardiac Enzymes: No results for input(s): CKTOTAL, CKMB, CKMBINDEX, TROPONINI in the last 168 hours. BNP (last 3 results) Recent Labs    08/05/16 1053 01/07/17 1528  PROBNP 385* 678*   HbA1C: No results for input(s): HGBA1C in the last 72 hours. CBG: No results for input(s): GLUCAP in the last 168 hours. Lipid Profile: No results for input(s): CHOL, HDL, LDLCALC, TRIG, CHOLHDL, LDLDIRECT in the last 72 hours. Thyroid Function Tests: No results for input(s): TSH, T4TOTAL, FREET4, T3FREE, THYROIDAB in the last 72 hours. Anemia Panel: No results for input(s): VITAMINB12, FOLATE, FERRITIN, TIBC, IRON, RETICCTPCT in the last 72 hours. Sepsis Labs: No results for input(s): PROCALCITON, LATICACIDVEN in the last 168 hours.  No results found for this or any previous visit (from the past 240 hour(s)).    Radiology Studies: No results found.    Scheduled Meds: . diltiazem  240 mg Oral Daily  . furosemide  60 mg Intravenous Q8H  . methylPREDNISolone (SOLU-MEDROL) injection  40 mg Intravenous Daily  . nicotine  14 mg Transdermal Daily  . rivaroxaban  20 mg Oral Q supper   Continuous Infusions:   LOS: 2 days    Time spent: Total of 35 minutes spent with pt, greater than 50% of which was spent in discussion of  treatment, counseling and coordination of care  Chipper Oman, MD Pager: Text Page via [email or web address]   If 7PM-7AM, please contact night-coverage [email or web address] 07/05/2017, 1:55 PM   Note - This record has been created using [name]. Chart creation errors have been  sought, but may not always have been located. Such creation errors do not reflect on the standard of medical care.

## 2017-07-06 LAB — BASIC METABOLIC PANEL
Anion gap: 11 (ref 5–15)
BUN: 18 mg/dL (ref 6–20)
CHLORIDE: 92 mmol/L — AB (ref 101–111)
CO2: 38 mmol/L — ABNORMAL HIGH (ref 22–32)
Calcium: 8.8 mg/dL — ABNORMAL LOW (ref 8.9–10.3)
Creatinine, Ser: 0.83 mg/dL (ref 0.61–1.24)
GFR calc Af Amer: >60 mL/min (ref >60)
GFR calc non Af Amer: >60 mL/min (ref >60)
Glucose, Bld: 111 mg/dL — ABNORMAL HIGH (ref 65–99)
POTASSIUM: 3.7 mmol/L (ref 3.5–5.1)
SODIUM: 141 mmol/L (ref 135–145)

## 2017-07-06 LAB — MAGNESIUM: MAGNESIUM: 2.4 mg/dL (ref 1.7–2.4)

## 2017-07-06 NOTE — Progress Notes (Signed)
PROGRESS NOTE [facility]   GREYCEN FELTER   EHU:[id number] DOB: 18-Jan-1961  DOA: 07/03/2017 PCP: Thomasene Ripple, MD   Brief Narrative:  Frank Morales is a 57 year old male with medical history of CHF, A. fib, hypertension, OSA, morbidly obese and tobacco abuse presented to the emergency department complaining of shortness of breath for 3-4 weeks prior to admission.  Patient reported compliant with diuretics but has slipped on his diet.  He reported high salt intake.  Upon ED evaluation BMP found to be 224, troponin negative, EKG with A. fib and RVR, chest x-ray shows cardiomegaly and increasing vascular congestion consistent with pulmonary edema and bilateral pleural effusions.  Patient was admitted with working diagnosis of acute on chronic CHF exacerbation.   Subjective: Patient seen and examined, breathing continued to improve.  His urine output was okay but overnight was drinking too much fluid.  Otherwise no complaints.  Ambulating with no issues  Assessment & Plan: Acute on chronic diastolic CHF exacerbation Likely from noncompliant with diet. High salt intake Weight is down11 pounds since admission, output is negative ~7 L Patient continues to be significantly overloaded, continue Lasix 60 milligrams IV every 8 hours Continue fluid restriction, decrease it to 1200 mL's per day, strict I and O's and daily weights Cardiology input appreciated Echocardiogram shows EF of 50-55%  Acute hypoxic respiratory failure due to CHF exacerbation - improved Treat underlying cause Off oxygen  A. fib with RVR - heart rate stabilized  continue Cardizem and Xarelto  Hypertension  BP stable  Continue current regimen  OSA Continue CPAP at night  Tobacco abuse Nicotine patch  Cessation discussed   DVT prophylaxis: Xarelto Code Status: Full code Family Communication: None at bedside Disposition Plan: Home in 2-3 days after fluid overload has improved.  Consultants:     Cardiology  Procedures:   Echocardiogram 4/19  Antimicrobials:  None   Objective: Vitals:   07/06/17 0324 07/06/17 0500 07/06/17 1235 07/06/17 1342  BP:  118/69  (!) 120/96  Pulse:  (!) 59 (!) 112 (!) 102  Resp:  20  16  Temp:  98.6 F (37 C)  98.4 F (36.9 C)  TempSrc:  Oral  Oral  SpO2: 100% 98%  98%  Weight:  (!) 166.7 kg (367 lb 6.4 oz)    Height:        Intake/Output Summary (Last 24 hours) at 07/06/2017 1418 Last data filed at 07/06/2017 1005 Gross per 24 hour  Intake 900 ml  Output 2650 ml  Net -1750 ml   Filed Weights   07/04/17 0619 07/05/17 0502 07/06/17 0500  Weight: (!) 170.3 kg (375 lb 6.4 oz) (!) 168.4 kg (371 lb 4.8 oz) (!) 166.7 kg (367 lb 6.4 oz)    Examination:  General: NAD Cardiovascular: Irregularly irregular, S1-S2, no murmur Respiratory: Diminished bilaterally, bibasilar crackles Abdominal: Soft, NT, ND Extremities: Lower extremity edema 3+ bilaterally  Data Reviewed: I have personally reviewed following labs and imaging studies  CBC: Recent Labs  Lab 07/03/17 0525 07/04/17 0417 07/05/17 0434  WBC 6.5 9.3 8.0  NEUTROABS 3.8  --   --   HGB 13.5 13.7 13.2  HCT 41.7 43.9 43.1  MCV 96.5 97.6 98.0  PLT 181 210 785   Basic Metabolic Panel: Recent Labs  Lab 07/03/17 0525 07/04/17 0417 07/05/17 0434 07/06/17 0509  NA 137 138 141 141  K 3.8 4.1 3.9 3.7  CL 96* 94* 93* 92*  CO2 30 35* 38* 38*  GLUCOSE 132* 139*  110* 111*  BUN 16 12 16 18   CREATININE 0.86 0.79 0.83 0.83  CALCIUM 8.8* 8.7* 8.7* 8.8*  MG 2.2  --   --  2.4  PHOS  --   --  3.7  --    GFR: Estimated Creatinine Clearance: 159.2 mL/min (by C-G formula based on SCr of 0.83 mg/dL). Liver Function Tests: Recent Labs  Lab 07/03/17 0525 07/05/17 0434  AST 20  --   ALT 18  --   ALKPHOS 47  --   BILITOT 0.4  --   PROT 7.1  --   ALBUMIN 3.3* 3.3*   No results for input(s): LIPASE, AMYLASE in the last 168 hours. No results for input(s): AMMONIA in the last  168 hours. Coagulation Profile: No results for input(s): INR, PROTIME in the last 168 hours. Cardiac Enzymes: No results for input(s): CKTOTAL, CKMB, CKMBINDEX, TROPONINI in the last 168 hours. BNP (last 3 results) Recent Labs    08/05/16 1053 01/07/17 1528  PROBNP 385* 678*   HbA1C: No results for input(s): HGBA1C in the last 72 hours. CBG: No results for input(s): GLUCAP in the last 168 hours. Lipid Profile: No results for input(s): CHOL, HDL, LDLCALC, TRIG, CHOLHDL, LDLDIRECT in the last 72 hours. Thyroid Function Tests: No results for input(s): TSH, T4TOTAL, FREET4, T3FREE, THYROIDAB in the last 72 hours. Anemia Panel: No results for input(s): VITAMINB12, FOLATE, FERRITIN, TIBC, IRON, RETICCTPCT in the last 72 hours. Sepsis Labs: No results for input(s): PROCALCITON, LATICACIDVEN in the last 168 hours.  No results found for this or any previous visit (from the past 240 hour(s)).    Radiology Studies: No results found.   Scheduled Meds: . diltiazem  240 mg Oral Daily  . furosemide  60 mg Intravenous Q8H  . methylPREDNISolone (SOLU-MEDROL) injection  40 mg Intravenous Daily  . nicotine  14 mg Transdermal Daily  . rivaroxaban  20 mg Oral Q supper   Continuous Infusions:   LOS: 3 days   Time spent: Total of 35 minutes spent with pt, greater than 50% of which was spent in discussion of  treatment, counseling and coordination of care  Chipper Oman, MD Pager: Text Page via [email or web address]   If 7PM-7AM, please contact night-coverage [email or web address] 07/06/2017, 2:18 PM   Note - This record has been created using [name]. Chart creation errors have been sought, but may not always have been located. Such creation errors do not reflect on the standard of medical care.

## 2017-07-06 NOTE — Progress Notes (Signed)
Progress Note  Patient Name: Frank Morales Date of Encounter: 07/06/2017  Primary Cardiologist: Dorris Carnes, MD   Subjective   Walked up and down halls feels better   Inpatient Medications    Scheduled Meds: . diltiazem  240 mg Oral Daily  . furosemide  60 mg Intravenous Q8H  . methylPREDNISolone (SOLU-MEDROL) injection  40 mg Intravenous Daily  . nicotine  14 mg Transdermal Daily  . rivaroxaban  20 mg Oral Q supper   Continuous Infusions:  PRN Meds: ipratropium-albuterol   Vital Signs    Vitals:   07/05/17 1513 07/05/17 2133 07/06/17 0324 07/06/17 0500  BP: 136/76 115/67  118/69  Pulse: 91 77  (!) 59  Resp: 16 20  20   Temp: 98.9 F (37.2 C) 99 F (37.2 C)  98.6 F (37 C)  TempSrc: Oral Oral  Oral  SpO2: 97% 98% 100% 98%  Weight:    (!) 367 lb 6.4 oz (166.7 kg)  Height:        Intake/Output Summary (Last 24 hours) at 07/06/2017 0838 Last data filed at 07/06/2017 0600 Gross per 24 hour  Intake 1370 ml  Output 1850 ml  Net -480 ml   Filed Weights   07/04/17 0619 07/05/17 0502 07/06/17 0500  Weight: (!) 375 lb 6.4 oz (170.3 kg) (!) 371 lb 4.8 oz (168.4 kg) (!) 367 lb 6.4 oz (166.7 kg)    Telemetry    A fib with RVR at times, + PVCs - Personally Reviewed  ECG    4/18 afib rate 113 PVC;s   Physical Exam   BP 118/69 (BP Location: Right Arm)   Pulse (!) 59   Temp 98.6 F (37 C) (Oral)   Resp 20   Ht 5\' 11"  (1.803 m)   Wt (!) 367 lb 6.4 oz (166.7 kg)   SpO2 98%   BMI 51.24 kg/m  Affect appropriate Morbidly obese black male  HEENT: normal Neck supple with no adenopathy JVP normal no bruits no thyromegaly Lungs mild exp  wheezing and good diaphragmatic motion Heart:  S1/S2 no murmur, no rub, gallop or click PMI normal Abdomen: benighn, BS positve, no tenderness, no AAA no bruit.  No HSM or HJR Distal pulses intact with no bruits Plus 3 tense bilateral  edema Neuro non-focal Skin warm and dry No muscular weakness   Labs      Chemistry Recent Labs  Lab 07/03/17 0525 07/04/17 0417 07/05/17 0434 07/06/17 0509  NA 137 138 141 141  K 3.8 4.1 3.9 3.7  CL 96* 94* 93* 92*  CO2 30 35* 38* 38*  GLUCOSE 132* 139* 110* 111*  BUN 16 12 16 18   CREATININE 0.86 0.79 0.83 0.83  CALCIUM 8.8* 8.7* 8.7* 8.8*  PROT 7.1  --   --   --   ALBUMIN 3.3*  --  3.3*  --   AST 20  --   --   --   ALT 18  --   --   --   ALKPHOS 47  --   --   --   BILITOT 0.4  --   --   --   GFRNONAA >60 >60 >60 >60  GFRAA >60 >60 >60 >60  ANIONGAP 11 9 10 11      Hematology Recent Labs  Lab 07/03/17 0525 07/04/17 0417 07/05/17 0434  WBC 6.5 9.3 8.0  RBC 4.32 4.50 4.40  HGB 13.5 13.7 13.2  HCT 41.7 43.9 43.1  MCV 96.5 97.6 98.0  MCH  31.3 30.4 30.0  MCHC 32.4 31.2 30.6  RDW 14.5 14.6 14.6  PLT 181 210 201    Cardiac EnzymesNo results for input(s): TROPONINI in the last 168 hours.  Recent Labs  Lab 07/03/17 0532  TROPIPOC 0.00     BNP Recent Labs  Lab 07/03/17 0522 07/05/17 0434  BNP 224.8* 194.8*     DDimer No results for input(s): DDIMER in the last 168 hours.   Radiology    No results found.  Cardiac Studies   Echo EF 50-55% moderate LVH estimated PA 38 mmHg     Patient Profile     57 y.o. male with a hx of chronic diastolic HF, permanent  atrial flutter with failed DCCV now rate control on Xarelto for CHAD2DS2VASc of 2, OSA, cath 2017 without CAD now admitted for acute diastolic HF.  Admitted to increased salt intake.      Assessment & Plan    Acute on chronic diastolic HF:   good diuresis will required at least 2 more days of iv diuresis discussed dietary issue  Afib:  Continue cardizem and xarelto    Smoking:  Cessation discussed     For questions or updates, please contact [place] Please consult [email or web address] for contact info under Cardiology/STEMI.      Signed, Jenkins Rouge, MD  07/06/2017, 8:38 AM

## 2017-07-07 DIAGNOSIS — I5033 Acute on chronic diastolic (congestive) heart failure: Secondary | ICD-10-CM

## 2017-07-07 LAB — BASIC METABOLIC PANEL
ANION GAP: 10 (ref 5–15)
BUN: 18 mg/dL (ref 6–20)
CO2: 41 mmol/L — AB (ref 22–32)
Calcium: 9 mg/dL (ref 8.9–10.3)
Chloride: 91 mmol/L — ABNORMAL LOW (ref 101–111)
Creatinine, Ser: 0.75 mg/dL (ref 0.61–1.24)
GFR calc Af Amer: >60 mL/min (ref >60)
GFR calc non Af Amer: >60 mL/min (ref >60)
GLUCOSE: 113 mg/dL — AB (ref 65–99)
POTASSIUM: 3.9 mmol/L (ref 3.5–5.1)
Sodium: 142 mmol/L (ref 135–145)

## 2017-07-07 MED ORDER — FLUTICASONE PROPIONATE 50 MCG/ACT NA SUSP
2.0000 | Freq: Every day | NASAL | Status: DC
  Administered 2017-07-07 – 2017-07-09 (×3): 2 via NASAL
  Filled 2017-07-07: qty 16

## 2017-07-07 MED ORDER — DILTIAZEM HCL ER COATED BEADS 180 MG PO CP24
300.0000 mg | ORAL_CAPSULE | Freq: Every day | ORAL | Status: DC
  Administered 2017-07-08 – 2017-07-09 (×2): 300 mg via ORAL
  Filled 2017-07-07 (×2): qty 1

## 2017-07-07 MED ORDER — LORATADINE 10 MG PO TABS
10.0000 mg | ORAL_TABLET | Freq: Every day | ORAL | Status: DC
  Administered 2017-07-07 – 2017-07-09 (×3): 10 mg via ORAL
  Filled 2017-07-07 (×3): qty 1

## 2017-07-07 NOTE — Progress Notes (Signed)
called back to room and patient has changed his mind and wants to wear his CPAP.  Placed on patient at this timec

## 2017-07-07 NOTE — Progress Notes (Signed)
Patient refused CPAP for tonight 

## 2017-07-07 NOTE — Progress Notes (Signed)
PROGRESS NOTE    Frank Morales  VQM:[id number] DOB: 18-Jan-1961 DOA: 07/03/2017 PCP: Frank Ripple, MD   Brief Narrative:  [name] is a 57 year old male who presents with progressively worsening shortness of breath x 3-4 weeks, past medical history significant for chronic diastolic congestive heart failure, chronic atrial fibrillation, hypertension, statis dermatitis, obstructive sleep apnea, morbid obesity, tobacco dependence and medical noncompliance. States being compliant on diuretics but reports consuming a lot of salt. Also reports orthopnea and nonproductive cough. CXR upon admission demonstrated cardiomegaly suggestive of pulmonary edema with small bilateral pleural effusions. EKG showed atrial fibrillation. BNP 224.8, Troponin negative. He was admitted with a working diagnosis of acute on chronic diastolic congestive heart failure.   Assessment & Plan:  Acute on chronic diastolic congestive heart failure -Most likely due to diet noncompliance. -Weight down ~13 pounds since admission, urine output in last 24 hours -3450 ml. -Continue IV Lasix to 60mg  3 times daily, since patient continues to be fluid overloaded. -Creatinine stable at 0.75, potassium stable at 3.9. -Continue fluid restriction at 1200 ml daily. -Continue salt restriction, strict I&O's and daily weights. -Cardiology on board and much appreciated. -Echocardiogram on 4/19 shows EF of 50-55%.  Acute hypoxic respiratory failure -Off oxygen, treating underlying cause. -Continue Duoneb as needed.  Atrial Fibrillation -CHAD2S2Vasc score 2 -Continue Diltiazem and Xarelto.   Hypertension -Continue Diltiazem.  Obstructive sleep apnea -Patient is prescribed CPAP but denies using it at home. -CPAP ordered, although patient continues to refuse.  Tobacco dependence -Nicotine patch  DVT prophylaxis: Xarelto. Code Status: FULL. Family Communication: None at bedside. Disposition Plan: Home in 1-2 days  when clinically improved.   Consultants:   Cardiology  Procedures:   Echocardiogram 4/19  Impression: EF 50-55%, Moderate LVH, PA 38 mmHg  Antimicrobials:   None.   Subjective: Patient is awake, alert and oriented x 3. Pleasant and interactive. Remains afebrile, vital signs stable, no new events overnight. Reports feeling much better, continuing to have lower leg edema, denies chest pain/tightness, shortness of breath, headache, dizziness, abdominal pain, nausea, vomiting, diarrhea or bruising. Asking for Flonase and anti-histamine which he takes at home and compression stockings/wraps for legs.  Objective: Vitals:   07/06/17 1500 07/06/17 2041 07/07/17 0529 07/07/17 0900  BP:  124/68 (!) 134/92   Pulse:  87 83   Resp:  18 18   Temp:  98 F (36.7 C) 97.7 F (36.5 C)   TempSrc:  Oral Oral   SpO2: 94% 99% 100% 96%  Weight:   (!) 164.5 kg (362 lb 9.6 oz)   Height:        Intake/Output Summary (Last 24 hours) at 07/07/2017 1152 Last data filed at 07/07/2017 0915 Gross per 24 hour  Intake 1350 ml  Output 4000 ml  Net -2650 ml   Filed Weights   07/05/17 0502 07/06/17 0500 07/07/17 0529  Weight: (!) 168.4 kg (371 lb 4.8 oz) (!) 166.7 kg (367 lb 6.4 oz) (!) 164.5 kg (362 lb 9.6 oz)    Examination:  General exam: Appears calm and comfortable  Respiratory system: Breath sounds diminished with mild crackles in the lower lung fields bilaterally. Respiratory effort normal. Cardiovascular system: S1 & S2 heard, Irregularly Irregular. No murmurs, rubs, gallops or clicks. +3 pitting edema in the lower extremities bilaterally. Gastrointestinal system: Abdomen is distended, soft and nontender. Normal bowel sounds heard. Central nervous system: Alert and oriented. No focal neurological deficits. Extremities: Moves all four extremities, ambulatory. Skin: +Stasis dermatitis on lower  extremities bilaterally, no rashes, lesions or ulcers. Psychiatry: Judgement and insight appear  normal. Mood & affect appropriate.     Data Reviewed: I have personally reviewed following labs and imaging studies  CBC: Recent Labs  Lab 07/03/17 0525 07/04/17 0417 07/05/17 0434  WBC 6.5 9.3 8.0  NEUTROABS 3.8  --   --   HGB 13.5 13.7 13.2  HCT 41.7 43.9 43.1  MCV 96.5 97.6 98.0  PLT 181 210 751   Basic Metabolic Panel: Recent Labs  Lab 07/03/17 0525 07/04/17 0417 07/05/17 0434 07/06/17 0509 07/07/17 0515  NA 137 138 141 141 142  K 3.8 4.1 3.9 3.7 3.9  CL 96* 94* 93* 92* 91*  CO2 30 35* 38* 38* 41*  GLUCOSE 132* 139* 110* 111* 113*  BUN 16 12 16 18 18   CREATININE 0.86 0.79 0.83 0.83 0.75  CALCIUM 8.8* 8.7* 8.7* 8.8* 9.0  MG 2.2  --   --  2.4  --   PHOS  --   --  3.7  --   --    GFR: Estimated Creatinine Clearance: 163.8 mL/min (by C-G formula based on SCr of 0.75 mg/dL). Liver Function Tests: Recent Labs  Lab 07/03/17 0525 07/05/17 0434  AST 20  --   ALT 18  --   ALKPHOS 47  --   BILITOT 0.4  --   PROT 7.1  --   ALBUMIN 3.3* 3.3*   No results for input(s): LIPASE, AMYLASE in the last 168 hours. No results for input(s): AMMONIA in the last 168 hours. Coagulation Profile: No results for input(s): INR, PROTIME in the last 168 hours. Cardiac Enzymes: No results for input(s): CKTOTAL, CKMB, CKMBINDEX, TROPONINI in the last 168 hours. BNP (last 3 results) Recent Labs    08/05/16 1053 01/07/17 1528  PROBNP 385* 678*   HbA1C: No results for input(s): HGBA1C in the last 72 hours. CBG: No results for input(s): GLUCAP in the last 168 hours. Lipid Profile: No results for input(s): CHOL, HDL, LDLCALC, TRIG, CHOLHDL, LDLDIRECT in the last 72 hours. Thyroid Function Tests: No results for input(s): TSH, T4TOTAL, FREET4, T3FREE, THYROIDAB in the last 72 hours. Anemia Panel: No results for input(s): VITAMINB12, FOLATE, FERRITIN, TIBC, IRON, RETICCTPCT in the last 72 hours. Sepsis Labs: No results for input(s): PROCALCITON, LATICACIDVEN in the last 168  hours.  No results found for this or any previous visit (from the past 240 hour(s)).       Radiology Studies: No results found.      Scheduled Meds: . [START ON 07/08/2017] diltiazem  300 mg Oral Daily  . fluticasone  2 spray Each Nare Daily  . furosemide  60 mg Intravenous Q8H  . loratadine  10 mg Oral Daily  . methylPREDNISolone (SOLU-MEDROL) injection  40 mg Intravenous Daily  . nicotine  14 mg Transdermal Daily  . rivaroxaban  20 mg Oral Q supper   Continuous Infusions:   LOS: 4 days    Time spent: 25 minutes.    Eloy End, PA-S [facility] Pager [phone]  If 7PM-7AM, please contact night-coverage [email or web address] Password [facility] 07/07/2017, 11:52 AM

## 2017-07-07 NOTE — Progress Notes (Signed)
CPAP offer and declined by patient stating that he does not want to wear.  Advised patient if he changes his mind to call and I will be glad to assist.

## 2017-07-07 NOTE — Progress Notes (Signed)
Progress Note  Patient Name: Frank Morales Date of Encounter: 07/07/2017  Primary Cardiologist: Dorris Carnes, MD   Subjective   Breathing ok without O2 at rest, wants to walk.  Does not have scales, does not know his dry weight.  Feels his breathing is about at baseline today.  Inpatient Medications    Scheduled Meds: . diltiazem  240 mg Oral Daily  . furosemide  60 mg Intravenous Q8H  . methylPREDNISolone (SOLU-MEDROL) injection  40 mg Intravenous Daily  . nicotine  14 mg Transdermal Daily  . rivaroxaban  20 mg Oral Q supper   Continuous Infusions:  PRN Meds: ipratropium-albuterol   Vital Signs    Vitals:   07/06/17 1500 07/06/17 2041 07/07/17 0529 07/07/17 0900  BP:  124/68 (!) 134/92   Pulse:  87 83   Resp:  18 18   Temp:  98 F (36.7 C) 97.7 F (36.5 C)   TempSrc:  Oral Oral   SpO2: 94% 99% 100% 96%  Weight:   (!) 362 lb 9.6 oz (164.5 kg)   Height:        Intake/Output Summary (Last 24 hours) at 07/07/2017 1000 Last data filed at 07/07/2017 0915 Gross per 24 hour  Intake 1470 ml  Output 4000 ml  Net -2530 ml   Filed Weights   07/05/17 0502 07/06/17 0500 07/07/17 0529  Weight: (!) 371 lb 4.8 oz (168.4 kg) (!) 367 lb 6.4 oz (166.7 kg) (!) 362 lb 9.6 oz (164.5 kg)    Telemetry    A fib with RVR at times, + PVCs - Personally Reviewed  ECG    4/18 afib rate 113 PVC;s   Physical Exam   BP (!) 134/92 (BP Location: Right Arm)   Pulse 83   Temp 97.7 F (36.5 C) (Oral)   Resp 18   Ht 5\' 11"  (1.803 m)   Wt (!) 362 lb 9.6 oz (164.5 kg)   SpO2 96%   BMI 50.57 kg/m  Affect appropriate Morbidly obese black male  HEENT: normal Neck supple with no adenopathy JVP normal (but difficult to assess 2nd body habitus)  no bruits no thyromegaly Lungs mild exp wheezing and good diaphragmatic motion Heart: Irreg R&R, S1/S2 no murmur, no rub, gallop or click Abdomen: benighn, BS positve, no tenderness, no AAA no bruit.  No HSM or HJR Distal pulses intact  with no bruits 2-3+ bilateral LE edema Neuro non-focal Skin warm and dry No muscular weakness   Labs    Chemistry Recent Labs  Lab 07/03/17 0525  07/05/17 0434 07/06/17 0509 07/07/17 0515  NA 137   < > 141 141 142  K 3.8   < > 3.9 3.7 3.9  CL 96*   < > 93* 92* 91*  CO2 30   < > 38* 38* 41*  GLUCOSE 132*   < > 110* 111* 113*  BUN 16   < > 16 18 18   CREATININE 0.86   < > 0.83 0.83 0.75  CALCIUM 8.8*   < > 8.7* 8.8* 9.0  PROT 7.1  --   --   --   --   ALBUMIN 3.3*  --  3.3*  --   --   AST 20  --   --   --   --   ALT 18  --   --   --   --   ALKPHOS 47  --   --   --   --   BILITOT 0.4  --   --   --   --  GFRNONAA >60   < > >60 >60 >60  GFRAA >60   < > >60 >60 >60  ANIONGAP 11   < > 10 11 10    < > = values in this interval not displayed.     Hematology Recent Labs  Lab 07/03/17 0525 07/04/17 0417 07/05/17 0434  WBC 6.5 9.3 8.0  RBC 4.32 4.50 4.40  HGB 13.5 13.7 13.2  HCT 41.7 43.9 43.1  MCV 96.5 97.6 98.0  MCH 31.3 30.4 30.0  MCHC 32.4 31.2 30.6  RDW 14.5 14.6 14.6  PLT 181 210 201    Cardiac Enzymes  Recent Labs  Lab 07/03/17 0532  TROPIPOC 0.00     BNP Recent Labs  Lab 07/03/17 0522 07/05/17 0434  BNP 224.8* 194.8*      Radiology    No results found.  Cardiac Studies   Echo 04/19 - Left ventricle: The cavity size was mildly dilated. There was   moderate concentric hypertrophy. Systolic function was at the   lower limits of normal. The estimated ejection fraction was in   the range of 50% to 55%. Although no diagnostic regional wall   motion abnormality was identified, this possibility cannot be   completely excluded on the basis of this study. Acoustic contrast   opacification revealed no evidence ofthrombus. - Left atrium: The atrium was moderately dilated. - Pulmonary arteries: Systolic pressure was mildly increased. PA   peak pressure: 38 mm Hg (S). Impressions: - Difficult study, even with Definity.    Patient Profile       57 y.o. male with a hx of chronic diastolic HF, permanent  atrial flutter with failed DCCV now rate control, on Xarelto for CHAD2DS2VASc of 2, OSA, cath 2017 without CAD, admitted 04/18 for acute diastolic HF.  Admits to increased salt intake.    Assessment & Plan    Acute on chronic diastolic HF:  I/O net 3.5 L 04/21 - may require 2 more days of IV diuresis - ok to start ambulating and see how tolerated. - May need home O2 - consider nutrition referral. - will try to get him scales.  Afib:  Continue Cardizem CD 240 mg and Xarelto  - HR frequently elevated - steroids may be contributing to elevated HR - SBP 110s-130s  Smoking:  Cessation discussed    For questions or updates, please contact [place] Please consult [email or web address] for contact info under Cardiology/STEMI.      Signed, Rosaria Ferries, PA-C  07/07/2017, 10:00 AM

## 2017-07-07 NOTE — Plan of Care (Signed)
  Problem: Clinical Measurements: Goal: Ability to maintain clinical measurements within normal limits will improve Outcome: Progressing Goal: Will remain free from infection Outcome: Progressing Goal: Diagnostic test results will improve Outcome: Progressing Goal: Respiratory complications will improve Outcome: Progressing Goal: Cardiovascular complication will be avoided Outcome: Progressing   Problem: Cardiac: Goal: Ability to achieve and maintain adequate cardiopulmonary perfusion will improve Outcome: Progressing   

## 2017-07-08 DIAGNOSIS — E876 Hypokalemia: Secondary | ICD-10-CM

## 2017-07-08 LAB — BASIC METABOLIC PANEL
ANION GAP: 10 (ref 5–15)
BUN: 19 mg/dL (ref 6–20)
CHLORIDE: 89 mmol/L — AB (ref 101–111)
CO2: 40 mmol/L — ABNORMAL HIGH (ref 22–32)
Calcium: 8.6 mg/dL — ABNORMAL LOW (ref 8.9–10.3)
Creatinine, Ser: 0.81 mg/dL (ref 0.61–1.24)
Glucose, Bld: 115 mg/dL — ABNORMAL HIGH (ref 65–99)
POTASSIUM: 3.4 mmol/L — AB (ref 3.5–5.1)
SODIUM: 139 mmol/L (ref 135–145)

## 2017-07-08 LAB — MAGNESIUM: Magnesium: 2.4 mg/dL (ref 1.7–2.4)

## 2017-07-08 MED ORDER — POTASSIUM CHLORIDE CRYS ER 20 MEQ PO TBCR
40.0000 meq | EXTENDED_RELEASE_TABLET | Freq: Once | ORAL | Status: AC
  Administered 2017-07-08: 40 meq via ORAL
  Filled 2017-07-08: qty 2

## 2017-07-08 NOTE — Progress Notes (Signed)
SATURATION QUALIFICATIONS: (This note is used to comply with regulatory documentation for home oxygen)  Patient Saturations on Room Air at Rest = 94%  Patient Saturations on Room Air while Ambulating = 87%  Patient Saturations on 1 Liters of oxygen while Ambulating = 92%  Please briefly explain why patient needs home oxygen: pt oxygen level drops when ambulating, will most likely need oxygen at home on an as needed basis.

## 2017-07-08 NOTE — Progress Notes (Signed)
Progress Note  Patient Name: Frank Morales Date of Encounter: 07/08/2017  Primary Cardiologist: Dorris Carnes, MD   Subjective   Breathing well. Willing to make lifestyle changes so he does not end up in the hospital.  Walking in the halls without difficulty.  Inpatient Medications    Scheduled Meds: . diltiazem  300 mg Oral Daily  . fluticasone  2 spray Each Nare Daily  . furosemide  60 mg Intravenous Q8H  . loratadine  10 mg Oral Daily  . methylPREDNISolone (SOLU-MEDROL) injection  40 mg Intravenous Daily  . nicotine  14 mg Transdermal Daily  . potassium chloride  40 mEq Oral Once  . rivaroxaban  20 mg Oral Q supper   Continuous Infusions:  PRN Meds: ipratropium-albuterol   Vital Signs    Vitals:   07/07/17 1321 07/07/17 2122 07/08/17 0500 07/08/17 0531  BP: 128/78 107/71  (!) 127/97  Pulse: 74 77  (!) 49  Resp: 18 20  16   Temp: 97.8 F (36.6 C) 98.4 F (36.9 C)  98 F (36.7 C)  TempSrc: Oral Oral  Oral  SpO2: 99% 98%  97%  Weight:   (!) 360 lb 11.2 oz (163.6 kg)   Height:        Intake/Output Summary (Last 24 hours) at 07/08/2017 1053 Last data filed at 07/08/2017 0957 Gross per 24 hour  Intake 1080 ml  Output 3725 ml  Net -2645 ml   Filed Weights   07/06/17 0500 07/07/17 0529 07/08/17 0500  Weight: (!) 367 lb 6.4 oz (166.7 kg) (!) 362 lb 9.6 oz (164.5 kg) (!) 360 lb 11.2 oz (163.6 kg)    Telemetry    A fib with RVR at times, + PVCs - Personally Reviewed  ECG    4/18 afib rate 113 PVC;s   Physical Exam   BP (!) 127/97 (BP Location: Right Arm)   Pulse (!) 49   Temp 98 F (36.7 C) (Oral)   Resp 16   Ht 5\' 11"  (1.803 m)   Wt (!) 360 lb 11.2 oz (163.6 kg)   SpO2 97%   BMI 50.31 kg/m  Affect appropriate Morbidly obese black male  HEENT: normal Neck supple with no adenopathy JVP not seen elevated (but difficult to assess 2nd body habitus)  no bruits no thyromegaly Lungs essentially clear bilaterally Heart: Irreg R&R, S1/S2 no murmur,  no rub, gallop or click Abdomen: benighn, BS positve, no tenderness, no AAA no bruit.  No HSM or HJR Distal pulses intact with no bruits 1+ bilateral LE edema, chronic stasis changes Neuro non-focal Skin warm and dry No muscular weakness   Labs    Chemistry Recent Labs  Lab 07/03/17 0525  07/05/17 0434 07/06/17 0509 07/07/17 0515 07/08/17 0505  NA 137   < > 141 141 142 139  K 3.8   < > 3.9 3.7 3.9 3.4*  CL 96*   < > 93* 92* 91* 89*  CO2 30   < > 38* 38* 41* 40*  GLUCOSE 132*   < > 110* 111* 113* 115*  BUN 16   < > 16 18 18 19   CREATININE 0.86   < > 0.83 0.83 0.75 0.81  CALCIUM 8.8*   < > 8.7* 8.8* 9.0 8.6*  PROT 7.1  --   --   --   --   --   ALBUMIN 3.3*  --  3.3*  --   --   --   AST 20  --   --   --   --   --  ALT 18  --   --   --   --   --   ALKPHOS 47  --   --   --   --   --   BILITOT 0.4  --   --   --   --   --   GFRNONAA >60   < > >60 >60 >60 >60  GFRAA >60   < > >60 >60 >60 >60  ANIONGAP 11   < > 10 11 10 10    < > = values in this interval not displayed.     Hematology Recent Labs  Lab 07/03/17 0525 07/04/17 0417 07/05/17 0434  WBC 6.5 9.3 8.0  RBC 4.32 4.50 4.40  HGB 13.5 13.7 13.2  HCT 41.7 43.9 43.1  MCV 96.5 97.6 98.0  MCH 31.3 30.4 30.0  MCHC 32.4 31.2 30.6  RDW 14.5 14.6 14.6  PLT 181 210 201    Cardiac Enzymes  Recent Labs  Lab 07/03/17 0532  TROPIPOC 0.00     BNP Recent Labs  Lab 07/03/17 0522 07/05/17 0434  BNP 224.8* 194.8*      Radiology    No results found.  Cardiac Studies   Echo 04/19 - Left ventricle: The cavity size was mildly dilated. There was   moderate concentric hypertrophy. Systolic function was at the   lower limits of normal. The estimated ejection fraction was in   the range of 50% to 55%. Although no diagnostic regional wall   motion abnormality was identified, this possibility cannot be   completely excluded on the basis of this study. Acoustic contrast   opacification revealed no evidence  ofthrombus. - Left atrium: The atrium was moderately dilated. - Pulmonary arteries: Systolic pressure was mildly increased. PA   peak pressure: 38 mm Hg (S). Impressions: - Difficult study, even with Definity.    Patient Profile     57 y.o. male with a hx of chronic diastolic HF, permanent  atrial flutter with failed DCCV now rate control, on Xarelto for CHAD2DS2VASc of 2, OSA, cath 2017 without CAD, admitted 04/18 for acute diastolic HF.  Admits to increased salt intake.    Assessment & Plan    Acute on chronic diastolic HF:  I/O net neg 11,875 cc since admit - ambulating without difficulty, will have staff ck sats w/ ambulation - May need home O2 - consider nutrition referral. - no answer yet from CHF RN [id number], he will need ones that go to 400 lbs, not sure she can get these. - discussed w/ pt the need to do low Na diet and weigh daily to stay out of the hospital. - currently on Lasix 60 mg IV q 8, consider change to torsemide 40 or 60 mg bid - will need early f/u   Afib:  Continue Cardizem CD now 300 mg and Xarelto  - HR better controlled - steroids may be contributing to elevated HR - SBP 100s-120s  Smoking:  Cessation encouraged    For questions or updates, please contact [place] Please consult [email or web address] for contact info under Cardiology/STEMI.      Signed, Rosaria Ferries, PA-C  07/08/2017, 10:53 AM

## 2017-07-08 NOTE — Progress Notes (Addendum)
PROGRESS NOTE    ALVA BROXSON  HUT:[id number] DOB: Dec 20, 1960 DOA: 07/03/2017 PCP: Thomasene Ripple, MD    Brief Narrative:  Mr. Kongmeng Santoro is a 57 year old male who presents with progressively worsening shortness of breath x 3-4 weeks, past medical history significant for chronic diastolic congestive heart failure, chronic atrial fibrillation,hypertension,statis dermatitis, obstructive sleep apnea, morbid obesity, tobacco dependence and medical noncompliance. States being compliant on diuretics but reports consuming a lot of salt. Also reports orthopnea andnonproductive cough.CXR upon admission demonstrated cardiomegaly suggestive of pulmonary edema with small bilateral pleural effusions. EKG showed atrial fibrillation.BNP 224.8, Troponin negative.He was admitted with a working diagnosis of acute on chronic diastolic congestive heart failure.   Assessment & Plan:   Acute on chronic diastolic congestive heart failure - improved -Likely due to diet noncompliance. -Clinically improved, denies cough or shortness of breath.  -Weight down ~15.5 pounds since admission, although continues to be ~10 pounds over baseline as recorded by his outpatient cardiologist on 02/2018, good urine output in the last 24 hours -1945 ml. -Echocardiogram on 4/19 shows EF of 50-55%. -Continue IV Lasix to 60mg  3 times daily, since patient continues to be fluid overloaded. -Continue compression hose as tolerated. -Cardiology consulting and appreciated.  Hypokalemia -Today 3.4, replenish with Klor-con tablet 40 mEq.  -Continue to monitor.  Acute hypoxic respiratory failure due to CHF exacerbation - improved -Off oxygen, continue Duonebs.  A.fib with RVR -Cardiology increased Diltiazem to 300 mg once daily for better rate and blood pressure control. -Continue Xarelto.  Hypertension -Continue Diltiazem.  OSA -Continue CPAP.  Tobacco abuse -Nicotine patch, counseling discussed.  DVT  prophylaxis: Xarelto. Code Status: FULL. Family Communication: None at bedside. Disposition Plan: Home in 1-2 days, once clinically improved.  Consultants:   Cardiology.  Procedures:   Echocardiogram 4/19             Impression: EF 50-55%, Moderate LVH, PA 38 mmHg  Antimicrobials:  None.   Subjective: Patient is awake, alert and oriented x 3. Reports feeling much improved since admission. Continues to experience orthopnea and mild swelling of lower extremities. Denies cough, shortness of breath, shortness of breath with exertion, chest pain, abdominal pain.  Objective: Vitals:   07/07/17 1321 07/07/17 2122 07/08/17 0500 07/08/17 0531  BP: 128/78 107/71  (!) 127/97  Pulse: 74 77  (!) 49  Resp: 18 20  16   Temp: 97.8 F (36.6 C) 98.4 F (36.9 C)  98 F (36.7 C)  TempSrc: Oral Oral  Oral  SpO2: 99% 98%  97%  Weight:   (!) 163.6 kg (360 lb 11.2 oz)   Height:        Intake/Output Summary (Last 24 hours) at 07/08/2017 0847 Last data filed at 07/08/2017 0300 Gross per 24 hour  Intake 1080 ml  Output 3025 ml  Net -1945 ml   Filed Weights   07/06/17 0500 07/07/17 0529 07/08/17 0500  Weight: (!) 166.7 kg (367 lb 6.4 oz) (!) 164.5 kg (362 lb 9.6 oz) (!) 163.6 kg (360 lb 11.2 oz)    Examination:  General exam: Appears calm and comfortable  Respiratory system: Clear to auscultation. Respiratory effort normal. Cardiovascular system: S1 & S2 heard, Irregularly irregular. No murmurs, rubs, gallops or clicks. +2  non pitting edema. Gastrointestinal system: Abdomen is nondistended, soft and nontender. Normal bowel sounds heard. Central nervous system: Alert and oriented. No focal neurological deficits. Extremities: Moves all four extremities, ambulatory. Skin: +Chronic statis dermatitis. No rashes, lesions or ulcers. Psychiatry: Judgement and  insight appear normal. Mood & affect appropriate.   Data Reviewed: I have personally reviewed following labs and imaging  studies  CBC: Recent Labs  Lab 07/03/17 0525 07/04/17 0417 07/05/17 0434  WBC 6.5 9.3 8.0  NEUTROABS 3.8  --   --   HGB 13.5 13.7 13.2  HCT 41.7 43.9 43.1  MCV 96.5 97.6 98.0  PLT 181 210 258   Basic Metabolic Panel: Recent Labs  Lab 07/03/17 0525 07/04/17 0417 07/05/17 0434 07/06/17 0509 07/07/17 0515 07/08/17 0505  NA 137 138 141 141 142 139  K 3.8 4.1 3.9 3.7 3.9 3.4*  CL 96* 94* 93* 92* 91* 89*  CO2 30 35* 38* 38* 41* 40*  GLUCOSE 132* 139* 110* 111* 113* 115*  BUN 16 12 16 18 18 19   CREATININE 0.86 0.79 0.83 0.83 0.75 0.81  CALCIUM 8.8* 8.7* 8.7* 8.8* 9.0 8.6*  MG 2.2  --   --  2.4  --   --   PHOS  --   --  3.7  --   --   --    GFR: Estimated Creatinine Clearance: 161.2 mL/min (by C-G formula based on SCr of 0.81 mg/dL). Liver Function Tests: Recent Labs  Lab 07/03/17 0525 07/05/17 0434  AST 20  --   ALT 18  --   ALKPHOS 47  --   BILITOT 0.4  --   PROT 7.1  --   ALBUMIN 3.3* 3.3*   No results for input(s): LIPASE, AMYLASE in the last 168 hours. No results for input(s): AMMONIA in the last 168 hours. Coagulation Profile: No results for input(s): INR, PROTIME in the last 168 hours. Cardiac Enzymes: No results for input(s): CKTOTAL, CKMB, CKMBINDEX, TROPONINI in the last 168 hours. BNP (last 3 results) Recent Labs    08/05/16 1053 01/07/17 1528  PROBNP 385* 678*   HbA1C: No results for input(s): HGBA1C in the last 72 hours. CBG: No results for input(s): GLUCAP in the last 168 hours. Lipid Profile: No results for input(s): CHOL, HDL, LDLCALC, TRIG, CHOLHDL, LDLDIRECT in the last 72 hours. Thyroid Function Tests: No results for input(s): TSH, T4TOTAL, FREET4, T3FREE, THYROIDAB in the last 72 hours. Anemia Panel: No results for input(s): VITAMINB12, FOLATE, FERRITIN, TIBC, IRON, RETICCTPCT in the last 72 hours. Sepsis Labs: No results for input(s): PROCALCITON, LATICACIDVEN in the last 168 hours.  No results found for this or any previous  visit (from the past 240 hour(s)).    Radiology Studies: No results found.   Scheduled Meds: . diltiazem  300 mg Oral Daily  . fluticasone  2 spray Each Nare Daily  . furosemide  60 mg Intravenous Q8H  . loratadine  10 mg Oral Daily  . methylPREDNISolone (SOLU-MEDROL) injection  40 mg Intravenous Daily  . nicotine  14 mg Transdermal Daily  . rivaroxaban  20 mg Oral Q supper   Continuous Infusions:   LOS: 5 days    Time spent: 25 minutes.   Eloy End, PA-S [facility] Pager [phone]  If 7PM-7AM, please contact night-coverage [email or web address] Password [facility] 07/08/2017, 8:47 AM

## 2017-07-09 ENCOUNTER — Encounter: Payer: Self-pay | Admitting: Physician Assistant

## 2017-07-09 DIAGNOSIS — E876 Hypokalemia: Secondary | ICD-10-CM

## 2017-07-09 LAB — BASIC METABOLIC PANEL
ANION GAP: 12 (ref 5–15)
BUN: 21 mg/dL — ABNORMAL HIGH (ref 6–20)
CO2: 38 mmol/L — ABNORMAL HIGH (ref 22–32)
Calcium: 9.3 mg/dL (ref 8.9–10.3)
Chloride: 91 mmol/L — ABNORMAL LOW (ref 101–111)
Creatinine, Ser: 0.85 mg/dL (ref 0.61–1.24)
Glucose, Bld: 90 mg/dL (ref 65–99)
POTASSIUM: 4 mmol/L (ref 3.5–5.1)
SODIUM: 141 mmol/L (ref 135–145)

## 2017-07-09 MED ORDER — NICOTINE 14 MG/24HR TD PT24: 14.0000 mg | MEDICATED_PATCH | Freq: Every day | TRANSDERMAL | 0 refills | Status: DC

## 2017-07-09 MED ORDER — DILTIAZEM HCL 30 MG PO TABS
30.0000 mg | ORAL_TABLET | Freq: Once | ORAL | Status: AC
  Administered 2017-07-09: 30 mg via ORAL
  Filled 2017-07-09: qty 1

## 2017-07-09 MED ORDER — PREDNISONE 20 MG PO TABS: ORAL_TABLET | ORAL | 0 refills | Status: DC

## 2017-07-09 MED ORDER — GUAIFENESIN ER 600 MG PO TB12: 600.0000 mg | ORAL_TABLET | Freq: Two times a day (BID) | ORAL | 0 refills | Status: DC

## 2017-07-09 MED ORDER — DILTIAZEM HCL ER COATED BEADS 360 MG PO CP24: 360.0000 mg | ORAL_CAPSULE | Freq: Every day | ORAL | 0 refills | Status: DC

## 2017-07-09 MED ORDER — PREDNISONE 20 MG PO TABS: 40.0000 mg | ORAL_TABLET | Freq: Every day | ORAL | Status: DC

## 2017-07-09 MED ORDER — GUAIFENESIN ER 600 MG PO TB12
600.0000 mg | ORAL_TABLET | Freq: Two times a day (BID) | ORAL | Status: DC
  Administered 2017-07-09 (×2): 600 mg via ORAL
  Filled 2017-07-09 (×2): qty 1

## 2017-07-09 MED ORDER — DILTIAZEM HCL ER COATED BEADS 180 MG PO CP24: 360.0000 mg | ORAL_CAPSULE | Freq: Every day | ORAL | Status: DC

## 2017-07-09 MED ORDER — FUROSEMIDE 40 MG PO TABS: 80.0000 mg | ORAL_TABLET | Freq: Two times a day (BID) | ORAL | Status: DC

## 2017-07-09 NOTE — Progress Notes (Signed)
Progress Note  Patient Name: SUNDANCE MOISE Date of Encounter: 07/09/2017  Primary Cardiologist: Dorris Carnes, MD   Subjective   Denies any shortness of breath ambulating in the room.  Still has some lower extremity edema but is wearing compression hose.  Really wants to go home today.  Inpatient Medications    Scheduled Meds: . diltiazem  300 mg Oral Daily  . fluticasone  2 spray Each Nare Daily  . furosemide  60 mg Intravenous Q8H  . guaiFENesin  600 mg Oral BID  . loratadine  10 mg Oral Daily  . nicotine  14 mg Transdermal Daily  . [START ON 07/10/2017] predniSONE  40 mg Oral QAC breakfast  . rivaroxaban  20 mg Oral Q supper   Continuous Infusions:  PRN Meds: ipratropium-albuterol   Vital Signs    Vitals:   07/08/17 2041 07/08/17 2254 07/09/17 0436 07/09/17 0438  BP: 115/82   109/78  Pulse: 74 73  61  Resp: 20 20  20   Temp: 97.7 F (36.5 C)   98.1 F (36.7 C)  TempSrc: Oral   Oral  SpO2: 94% 93%  92%  Weight:   (!) 356 lb 14.4 oz (161.9 kg)   Height:        Intake/Output Summary (Last 24 hours) at 07/09/2017 1131 Last data filed at 07/09/2017 0600 Gross per 24 hour  Intake 940 ml  Output 2300 ml  Net -1360 ml   Filed Weights   07/07/17 0529 07/08/17 0500 07/09/17 0436  Weight: (!) 362 lb 9.6 oz (164.5 kg) (!) 360 lb 11.2 oz (163.6 kg) (!) 356 lb 14.4 oz (161.9 kg)    Telemetry    Atrial fibrillation with HR 80-110bpm - Personally Reviewed  ECG    No new EKGs to review - Personally Reviewed  Physical Exam   GEN: No acute distress.   Neck: No JVD Cardiac: irregulalry irregular, no murmurs, rubs, or gallops.  Respiratory: Clear to auscultation bilaterally. GI: Soft, nontender, non-distended  MS: 2+ edema; No deformity. Neuro:  Nonfocal  Psych: Normal affect   Labs    Chemistry Recent Labs  Lab 07/03/17 0525  07/05/17 0434  07/07/17 0515 07/08/17 0505 07/09/17 0443  NA 137   < > 141   < > 142 139 141  K 3.8   < > 3.9   < > 3.9 3.4*  4.0  CL 96*   < > 93*   < > 91* 89* 91*  CO2 30   < > 38*   < > 41* 40* 38*  GLUCOSE 132*   < > 110*   < > 113* 115* 90  BUN 16   < > 16   < > 18 19 21*  CREATININE 0.86   < > 0.83   < > 0.75 0.81 0.85  CALCIUM 8.8*   < > 8.7*   < > 9.0 8.6* 9.3  PROT 7.1  --   --   --   --   --   --   ALBUMIN 3.3*  --  3.3*  --   --   --   --   AST 20  --   --   --   --   --   --   ALT 18  --   --   --   --   --   --   ALKPHOS 47  --   --   --   --   --   --  BILITOT 0.4  --   --   --   --   --   --   GFRNONAA >60   < > >60   < > >60 >60 >60  GFRAA >60   < > >60   < > >60 >60 >60  ANIONGAP 11   < > 10   < > 10 10 12    < > = values in this interval not displayed.     Hematology Recent Labs  Lab 07/03/17 0525 07/04/17 0417 07/05/17 0434  WBC 6.5 9.3 8.0  RBC 4.32 4.50 4.40  HGB 13.5 13.7 13.2  HCT 41.7 43.9 43.1  MCV 96.5 97.6 98.0  MCH 31.3 30.4 30.0  MCHC 32.4 31.2 30.6  RDW 14.5 14.6 14.6  PLT 181 210 201    Cardiac EnzymesNo results for input(s): TROPONINI in the last 168 hours.  Recent Labs  Lab 07/03/17 0532  TROPIPOC 0.00     BNP Recent Labs  Lab 07/03/17 0522 07/05/17 0434  BNP 224.8* 194.8*     DDimer No results for input(s): DDIMER in the last 168 hours.   Radiology    No results found.  Cardiac Studies   Echo 04/19 - Left ventricle: The cavity size was mildly dilated. There was moderate concentric hypertrophy. Systolic function was at the lower limits of normal. The estimated ejection fraction was in the range of 50% to 55%. Although no diagnostic regional wall motion abnormality was identified, this possibility cannot be completely excluded on the basis of this study. Acoustic contrast opacification revealed no evidence ofthrombus. - Left atrium: The atrium was moderately dilated. - Pulmonary arteries: Systolic pressure was mildly increased. PA peak pressure: 38 mm Hg (S). Impressions: - Difficult study, even with Definity.  Patient  Profile     57 y.o. male with a hx of chronic diastolic HF, permanent atrial flutter with failed DCCV now rate control, on Xarelto for CHAD2DS2VASc of 2, OSA, cath 2017 without CAD, admitted 04/18 for acute diastolic HF.  Admits to increased salt intake.    Assessment & Plan    Acute on chronic diastolic heart failure -Symptomatically improved -He put out 3 L yesterday and is net -13.2 L. -Weight is down another 4 pounds since yesterday and net weight loss since admission is 22 pounds -Creatinine continues to remain stable at 0.85 -Still has LE edema but improved with diuresis and compression hose -We will change Lasix to 80 mg p.o. twice daily -I encouraged him to be compliant with wearing his compression hose at home -He really wants to go home today.  His lungs are completely clear and he is ambulated without any shortness of breath.  It will take a while for the lower extremity edema to resolve.  I think he has stable to go home today with very close follow-up early next week in our office -Needs heart failure teaching on low-sodium diet as well as daily weights  Chronic atrial fibrillation -HR still elevated at times up to 109 bpm -Increase Cardizem to 360 mg a day to try to get better rate control -Continue Xarelto  Hypokalemia -Repleted at 4 today  Hypertension -BP improved after increasing Cardizem -Continue Cardizem CD   He is ambulating in the hall without any shortness of breath.  I think he is stable to go home as he really wants to be discharged today.  We will change his Lasix to 80 mg p.o. twice daily and have him follow-up in our clinic early next  week.  He needs to be compliant with a 2 g sodium diet and daily weights and call our office if his weight increases by 3 pounds in a day or 5 pounds in a week.  Will sign off please call with any questions  For questions or updates, please contact [place] Please consult [email or web address] for contact info under  Cardiology/STEMI.      Signed, Fransico Him, MD  07/09/2017, 11:31 AM

## 2017-07-09 NOTE — Progress Notes (Signed)
Per Dr. Theodosia Blender request, TOC visit scheduled, sent message to triage team for [facility] call.  Dayna Dunn PA-C

## 2017-07-10 NOTE — Discharge Summary (Signed)
Physician Discharge Summary  Frank Morales QIH:[id number] DOB: February 08, 1961 DOA: 07/03/2017  PCP: Frank Ripple, MD  Admit date: 07/03/2017 Discharge date: 07/09/2017  Admitted From: Home. Disposition: Home.   Recommendations for Outpatient Follow-up:  1. Follow up with PCP in 1-2 weeks 2. Please obtain BMP/CBC in one week 3. Please follow up with cardiology as recommended. :    Discharge Condition: stable.  CODE STATUS:full code.  Diet recommendation: Heart Healthy  Brief/Interim Summary: Patient is 57 year old male with medical history of CHF, A. fib, hypertension, OSA, morbidly obese and tobacco abuse presented to the emergency department complaining of shortness of breath for 3-4 weeks prior to admission. Patient reported compliant with diuretics but has slipped on his diet. He reported high salt intake. Upon ED evaluation BMP found to be 224, troponin negative, EKG with A. fib and RVR, chest x-ray shows cardiomegaly and increasing vascular congestion consistent with pulmonary edema and bilateral pleural effusions. Patient was admitted with working diagnosis of acute on chronic CHF exacerbation.  Discharge Diagnoses:  Active Problems:   Morbid obesity (HCC)   SOB (shortness of breath)   Acute on chronic diastolic CHF (congestive heart failure) (HCC)   Acute on chronic combined systolic and diastolic congestive heart failure (HCC)   Tobacco abuse counseling   Chronic atrial fibrillation (HCC)   Hypokalemia  Acute on chronic diastolic CHF exacerbation Likely from noncompliant with diet. High salt intake Patient continues to diurese well, he is down 18 pounds from admission, approximately -12 L.  Creatinine remains stable, still with significant edema on lower extremity.Echocardiogram shows EF of 50-55%. Pt denies any sob or chest pain and would like to go home and be compliant with meds.  Cardiology recommended outpatient follow up. Pt was discharged on oral lasix 80 mg  BID.   Acute hypoxic respiratory failure due to CHF exacerbation-improved Treat underlying cause Off oxygen  A. fib with RVR Heart rate improved after increasing Cardizem Continue Xarelto  Hypokalemia Repleted, keep potassium above 4   Hypertension BP improved with increasing Cardizem Monitor closely  OSA Continue CPAP at night  Tobacco abuse Nicotine patch  Cessation discussed   Discharge Instructions  Discharge Instructions    (HEART FAILURE PATIENTS) Call MD:  Anytime you have any of the following symptoms: 1) 3 pound weight gain in 24 hours or 5 pounds in 1 week 2) shortness of breath, with or without a dry hacking cough 3) swelling in the hands, feet or stomach 4) if you have to sleep on extra pillows at night in order to breathe.   Complete by:  As directed    Amb referral to AF[facility] Clinic   Complete by:  As directed    Diet - low sodium heart healthy   Complete by:  As directed    Heart Failure patients record your daily weight using the same scale at the same time of day   Complete by:  As directed    Increase activity slowly   Complete by:  As directed      Allergies as of 07/09/2017      Reactions   Lactose Intolerance (gi) Nausea And Vomiting      Medication List    STOP taking these medications   Na Sulfate-K Sulfate-Mg Sulf 17.5-3.13-1.6 GM/177ML Soln     TAKE these medications   diltiazem 360 MG 24 hr capsule Commonly known as:  CARDIZEM CD Take 1 capsule (360 mg total) by mouth daily. What changed:    medication strength  how much to take  additional instructions   fluticasone 50 MCG/ACT nasal spray Commonly known as:  FLONASE Place 1 spray into both nostrils daily.   furosemide 40 MG tablet Commonly known as:  LASIX Take 2 tablets (80 mg total) by mouth 2 (two) times daily.   guaiFENesin 600 MG 12 hr tablet Commonly known as:  MUCINEX Take 1 tablet (600 mg total) by mouth 2 (two) times daily.   loratadine 10 MG  tablet Commonly known as:  CLARITIN Take 1 tablet (10 mg total) by mouth daily.   nicotine 14 mg/24hr patch Commonly known as:  NICODERM CQ - dosed in mg/24 hours Place 1 patch (14 mg total) onto the skin daily.   nicotine polacrilex 2 MG gum Commonly known as:  NICORETTE Take 1 each (2 mg total) by mouth as needed for smoking cessation. PHARMACIST PLS COUNSEL, DISPENSE BRAND COVERED BY MEDICAID   potassium chloride 10 MEQ tablet Commonly known as:  K-DUR Take 1 tablet (10 mEq total) by mouth 2 (two) times daily.   predniSONE 20 MG tablet Commonly known as:  DELTASONE Prednisone 40 mg daily for 2 days followed by  Prednisone 20 mg daily for 2 days.   rivaroxaban 20 MG Tabs tablet Commonly known as:  XARELTO Take 1 tablet (20 mg total) by mouth daily with supper.      Follow-up Information    [name], PA-C Follow up.   Specialties:  Cardiology, Physician Assistant Why:  [facility] - 07/15/17 at 10:45am. Please arrive 15 minutes prior to appointment to check in. [name] is one of the PAs that works closely with Dr. Harrington Challenger and her care team. Contact information: [address] [place]  [address] [phone]          Allergies  Allergen Reactions  . Lactose Intolerance (Gi) Nausea And Vomiting    Consultations:  Cardiology.    Procedures/Studies: Dg Chest 2 View  Result Date: 07/03/2017 CLINICAL DATA:  Acute onset of shortness of breath. EXAM: CHEST - 2 VIEW COMPARISON:  Chest radiograph performed 08/17/2015 FINDINGS: The lungs are well-aerated. Vascular congestion is noted. Increased interstitial markings raise concern for pulmonary edema. Small bilateral pleural effusions are seen. There is no evidence of pneumothorax. The heart is mildly enlarged. No acute osseous abnormalities are seen. IMPRESSION: Vascular congestion and mild cardiomegaly. Increased interstitial markings raise concern for pulmonary edema. Small bilateral pleural  effusions seen. Electronically Signed   By: Garald Balding M.D.   On: 07/03/2017 06:00    Echocardiogram.    Subjective: Wants to go home, no sob or chest pain.   Discharge Exam: Vitals:   07/09/17 0438 07/09/17 1225  BP: 109/78 (!) 135/91  Pulse: 61 76  Resp: 20 20  Temp: 98.1 F (36.7 C) 99.4 F (37.4 C)  SpO2: 92% 95%   Vitals:   07/08/17 2254 07/09/17 0436 07/09/17 0438 07/09/17 1225  BP:   109/78 (!) 135/91  Pulse: 73  61 76  Resp: 20  20 20   Temp:   98.1 F (36.7 C) 99.4 F (37.4 C)  TempSrc:   Oral Oral  SpO2: 93%  92% 95%  Weight:  (!) 161.9 kg (356 lb 14.4 oz)    Height:        General: Pt is alert, awake, not in acute distress Cardiovascular: RRR, S1/S2 +, no rubs, no gallops Respiratory: CTA bilaterally, no wheezing, no rhonchi Abdominal: Soft, NT, ND, bowel sounds + Extremities:leg  edema, no cyanosis  The results of significant diagnostics from this hospitalization (including imaging, microbiology, ancillary and laboratory) are listed below for reference.     Microbiology: No results found for this or any previous visit (from the past 240 hour(s)).   Labs: BNP (last 3 results) Recent Labs    01/28/17 0000 07/03/17 0522 07/05/17 0434  BNP 251.3* 224.8* 891.6*   Basic Metabolic Panel: Recent Labs  Lab 07/05/17 0434 07/06/17 0509 07/07/17 0515 07/08/17 0505 07/09/17 0443  NA 141 141 142 139 141  K 3.9 3.7 3.9 3.4* 4.0  CL 93* 92* 91* 89* 91*  CO2 38* 38* 41* 40* 38*  GLUCOSE 110* 111* 113* 115* 90  BUN 16 18 18 19  21*  CREATININE 0.83 0.83 0.75 0.81 0.85  CALCIUM 8.7* 8.8* 9.0 8.6* 9.3  MG  --  2.4  --  2.4  --   PHOS 3.7  --   --   --   --    Liver Function Tests: Recent Labs  Lab 07/05/17 0434  ALBUMIN 3.3*   No results for input(s): LIPASE, AMYLASE in the last 168 hours. No results for input(s): AMMONIA in the last 168 hours. CBC: Recent Labs  Lab 07/04/17 0417 07/05/17 0434  WBC 9.3 8.0  HGB 13.7 13.2  HCT 43.9  43.1  MCV 97.6 98.0  PLT 210 201   Cardiac Enzymes: No results for input(s): CKTOTAL, CKMB, CKMBINDEX, TROPONINI in the last 168 hours. BNP: Invalid input(s): POCBNP CBG: No results for input(s): GLUCAP in the last 168 hours. D-Dimer No results for input(s): DDIMER in the last 72 hours. Hgb A1c No results for input(s): HGBA1C in the last 72 hours. Lipid Profile No results for input(s): CHOL, HDL, LDLCALC, TRIG, CHOLHDL, LDLDIRECT in the last 72 hours. Thyroid function studies No results for input(s): TSH, T4TOTAL, T3FREE, THYROIDAB in the last 72 hours.  Invalid input(s): FREET3 Anemia work up No results for input(s): VITAMINB12, FOLATE, FERRITIN, TIBC, IRON, RETICCTPCT in the last 72 hours. Urinalysis No results found for: COLORURINE, APPEARANCEUR, LABSPEC, [place], GLUCOSEU, HGBUR, BILIRUBINUR, KETONESUR, PROTEINUR, UROBILINOGEN, NITRITE, LEUKOCYTESUR Sepsis Labs Invalid input(s): PROCALCITONIN,  WBC,  LACTICIDVEN Microbiology No results found for this or any previous visit (from the past 240 hour(s)).   Time coordinating discharge: 35 minutes  SIGNED:   Hosie Poisson, MD  [facility] 07/10/2017, 8:00 AM Pager   If 7PM-7AM, please contact night-coverage [email or web address] Password [email or web address]

## 2017-07-11 ENCOUNTER — Telehealth: Payer: Self-pay

## 2017-07-11 NOTE — Telephone Encounter (Signed)
**Note De-Identified Frank Morales Obfuscation** Patient contacted regarding discharge from [facility] on 07/09/2017.  Patient understands to follow up with provider Richardson Dopp, PA-c on 07/15/2017 at 10:45 at [facility] in [place]. Patient understands discharge instructions? Yes Patient understands medications and regiment? Yes Patient understands to bring all medications to this visit? Yes

## 2017-07-11 NOTE — Telephone Encounter (Signed)
-----   Message from Loren Racer, LPN sent at 06/14/1914 11:47 AM EDT ----- Regarding: FW: Needs TOC phone call - appt 4/30 with SW   ----- Message ----- From: Charlie Pitter, PA-C Sent: 07/09/2017  11:44 AM To: [facility] Triage Subject: Needs TOC phone call - appt 4/30 with SW

## 2017-07-15 ENCOUNTER — Encounter: Payer: Self-pay | Admitting: Physician Assistant

## 2017-07-15 ENCOUNTER — Ambulatory Visit: Payer: Medicaid Other | Admitting: Physician Assistant

## 2017-07-15 VITALS — BP 132/80 | HR 74 | Ht 71.0 in | Wt 368.0 lb

## 2017-07-15 DIAGNOSIS — I10 Essential (primary) hypertension: Secondary | ICD-10-CM | POA: Diagnosis not present

## 2017-07-15 DIAGNOSIS — I482 Chronic atrial fibrillation, unspecified: Secondary | ICD-10-CM

## 2017-07-15 DIAGNOSIS — F17200 Nicotine dependence, unspecified, uncomplicated: Secondary | ICD-10-CM

## 2017-07-15 DIAGNOSIS — I5032 Chronic diastolic (congestive) heart failure: Secondary | ICD-10-CM

## 2017-07-15 LAB — BASIC METABOLIC PANEL
BUN/Creatinine Ratio: 24 — ABNORMAL HIGH (ref 9–20)
BUN: 17 mg/dL (ref 6–24)
CALCIUM: 9 mg/dL (ref 8.7–10.2)
CO2: 30 mmol/L — ABNORMAL HIGH (ref 20–29)
CREATININE: 0.72 mg/dL — AB (ref 0.76–1.27)
Chloride: 95 mmol/L — ABNORMAL LOW (ref 96–106)
GFR calc non Af Amer: 105 mL/min/{1.73_m2} (ref >59)
GFR, EST AFRICAN AMERICAN: 121 mL/min/{1.73_m2} (ref >59)
Glucose: 101 mg/dL — ABNORMAL HIGH (ref 65–99)
Potassium: 4.2 mmol/L (ref 3.5–5.2)
Sodium: 139 mmol/L (ref 134–144)

## 2017-07-15 NOTE — Addendum Note (Signed)
Addended by: Minette Brine on: 07/15/2017 11:36 AM   Modules accepted: Level of Service

## 2017-07-15 NOTE — Patient Instructions (Addendum)
Medication Instructions:  1. INCREASE LASIX TO 80 MG THREE TIMES DAILY; DO THIS FOR 3 DAYS ; AFTER THE 3 DAYS GO BACK LASIX 80 MG TWICE DAILY  2. WHILE ON THE INCREASED DOSE OF LASIX FOR 3 DAYS YOU WILL NEED TO TAKE AN EXTRA POTASSIUM DOSE OF 10 MEQ  Labwork: TODAY BMET  Testing/Procedures: NONE ORDERED TODAY  Follow-Up: KEEP YOUR APPT WITH DR. ROSS 09/12/17  YOU ARE SCHEDULED TO SEE VIN BHAGAT, PA ON 08/14/17 @ 10:30 AM FOR A 1 MONTH FOLLOW UP PER ANGIE DUKE, PAC TODAY   Any Other Special Instructions Will Be Listed Below (If Applicable).  MONITOR WEIGHT AND CALL THE OFFICE ON Monday 07/22/17 WITH WEIGHTS AND WHAT DOSE OF LASIX YOU HAD TAKEN ON EACH DAY   If you need a refill on your cardiac medications before your next appointment, please call your pharmacy.

## 2017-07-15 NOTE — Progress Notes (Signed)
Cardiology Office Note:    Date:  07/15/2017   ID:  Frank Morales, DOB [birth year], MRN [id number]  PCP:  Thomasene Ripple, MD  Cardiologist:  Dorris Carnes, MD   Referring MD: Thomasene Ripple, MD   Chief Complaint  Patient presents with  . Hospitalization Follow-up    TOC, chronic diastolic heart failure    History of Present Illness:    Frank Morales is a 57 y.o. male with a hx of chronic diastolic heart failure, permanent atrial flutter with previous failed DCCV rate controlled on xarelto, OSA, hx of substance abuse, HCV, and negative CAD by cath 2017. He was recently hospitalized 06/2017 for heart failure exacerbation - acute on chronic diastolic heart failure. At that time, he was consuming high sodium foods and did not have a scales at home. He was admitted and diuresed. Echo with severely dilated left atrium, normal LVEF. He underwent diuresis for six days in the hospital and diuresed 22 lbs and was overall net negative 13L. Discharge weight was 356 lbs. Renal function remained stable. Lasix was transitioned to 80 mg BID PO dosing and he was encouraged to wear compression stockings at home. Cardizem was increased to 360 mg daily for increased heart rates. He continued on xarelto.   He presents today for hospital follow up. Since discharge (07/09/17), he has been compliant on diuretic regimen. He is trying to be compliant with a low sodium diet, but did eat packaged sliced [place] and ham. He is borrowing a scales for daily weights. He reports increasing in weight to 364-365 lbs. Today he weighs 368 lbs. He does report a 3 lb weight gain in 1 day. He states he was instructed to call the office if this happened but he was out of town and failed to call. We again discussed the importance of managing his fluid status and weight outpatient along with sodium restriction. He reports mild increase in lower extremity swelling, but no abdominal fullness, SOB, or orthopnea. He is walking the hill  near his house 2-3 times daily. We discussed slowly increasing his walking program. He denies chest pain, palpitations, recent falls, and problems with bleeding.    Past Medical History:  Diagnosis Date  . Atrial fibrillation and flutter ([place]) 08/17/2015   A. S/p failed DCCV // b. Severe BAE on echo >> rate control strategy (has seen AF clinic) // c. Xarelto for anticoag (CHADS2-VASc=2 / CHF, HTN)  . Chronic diastolic CHF (congestive heart failure) ([place]) 08/30/2015   A. Echo 6/17: Apical HK, moderate focal basal and mild concentric LVH, EF 50-55, diffuse HK, trivial MR, severe BAE, mild TR, PASP 37  . Hepatitis C, chronic ([place]) 08/19/2015  . History of cardiac catheterization    a. LHC 6/17: LAD irregs, o/w no CAD  . OSA (obstructive sleep apnea) 11/21/2015  . Sleep apnea   . Tobacco abuse     Past Surgical History:  Procedure Laterality Date  . CARDIAC CATHETERIZATION N/A 08/21/2015   Procedure: Right/Left Heart Cath and Coronary Angiography;  Surgeon: Leonie Man, MD;  Location: [facility];  Service: Cardiovascular;  Laterality: N/A;  . CARDIOVERSION N/A 09/22/2015   Procedure: CARDIOVERSION;  Surgeon: Josue Hector, MD;  Location: [facility] ENDOSCOPY;  Service: Cardiovascular;  Laterality: N/A;  . COLONOSCOPY N/A 07/19/2016   Procedure: COLONOSCOPY;  Surgeon: Doran Stabler, MD;  Location: [facility] ENDOSCOPY;  Service: Gastroenterology;  Laterality: N/A;  . ESOPHAGOGASTRODUODENOSCOPY N/A 07/19/2016   Procedure: ESOPHAGOGASTRODUODENOSCOPY (EGD);  Surgeon: [name],  Kirke Corin, MD;  Location: [facility];  Service: Gastroenterology;  Laterality: N/A;  . NO PAST SURGERIES      Current Medications: Current Meds  Medication Sig  . diltiazem (CARDIZEM CD) 360 MG 24 hr capsule Take 1 capsule (360 mg total) by mouth daily.  . fluticasone (FLONASE) 50 MCG/ACT nasal spray Place 1 spray into both nostrils daily.  . furosemide (LASIX) 40 MG tablet Take 2 tablets (80 mg total) by mouth 2 (two) times  daily.  Marland Kitchen guaiFENesin (MUCINEX) 600 MG 12 hr tablet Take 1 tablet (600 mg total) by mouth 2 (two) times daily.  Marland Kitchen loratadine (CLARITIN) 10 MG tablet Take 1 tablet (10 mg total) by mouth daily.  . nicotine (NICODERM CQ - DOSED IN MG/24 HOURS) 14 mg/24hr patch Place 1 patch (14 mg total) onto the skin daily.  . rivaroxaban (XARELTO) 20 MG TABS tablet Take 1 tablet (20 mg total) by mouth daily with supper.     Allergies:   Lactose intolerance (gi)   Social History   Socioeconomic History  . Marital status: Single    Spouse name: Not on file  . Number of children: Not on file  . Years of education: Not on file  . Highest education level: Not on file  Occupational History  . Occupation: unemployed    Comment: Previous Soil scientist  . Financial resource strain: Not on file  . Food insecurity:    Worry: Not on file    Inability: Not on file  . Transportation needs:    Medical: Not on file    Non-medical: Not on file  Tobacco Use  . Smoking status: Current Every Day Smoker    Packs/day: 0.20    Types: Cigarettes  . Smokeless tobacco: Never Used  . Tobacco comment: down to 1-2 cig/day with NRT  Substance and Sexual Activity  . Alcohol use: Yes    Alcohol/week: 3.0 oz    Types: 5 Standard drinks or equivalent per week    Comment: 2 times a week beer  . Drug use: No  . Sexual activity: Not on file  Lifestyle  . Physical activity:    Days per week: Not on file    Minutes per session: Not on file  . Stress: Not on file  Relationships  . Social connections:    Talks on phone: Not on file    Gets together: Not on file    Attends religious service: Not on file    Active member of club or organization: Not on file    Attends meetings of clubs or organizations: Not on file    Relationship status: Not on file  Other Topics Concern  . Not on file  Social History Narrative  . Not on file     Family History: The patient's family history includes Diabetes in his mother;  Hypertension in his maternal grandfather and mother; Pneumonia in his father. There is no history of Colon cancer.  ROS:   Please see the history of present illness.     All other systems reviewed and are negative.  EKGs/Labs/Other Studies Reviewed:    The following studies were reviewed today:  Echo 04/19 - Left ventricle: The cavity size was mildly dilated. There was moderate concentric hypertrophy. Systolic function was at the lower limits of normal. The estimated ejection fraction was in the range of 50% to 55%. Although no diagnostic regional wall motion abnormality was identified, this possibility cannot be completely excluded on  the basis of this study. Acoustic contrast opacification revealed no evidence ofthrombus. - Left atrium: The atrium was moderately dilated. - Pulmonary arteries: Systolic pressure was mildly increased. PA peak pressure: 38 mm Hg (S). Impressions: - Difficult study, even with Definity.    EKG:  EKG is ordered today.  The ekg ordered today demonstrates rate controlled atrial fibrillation.  Recent Labs: 01/07/2017: NT-Pro BNP 678 07/03/2017: ALT 18 07/05/2017: B Natriuretic Peptide 194.8; Hemoglobin 13.2; Platelets 201 07/08/2017: Magnesium 2.4 07/09/2017: BUN 21; Creatinine, Ser 0.85; Potassium 4.0; Sodium 141  Recent Lipid Panel    Component Value Date/Time   CHOL 105 08/17/2015 1233   TRIG 182 (H) 08/17/2015 1233   HDL 28 (L) 08/17/2015 1233   CHOLHDL 3.8 08/17/2015 1233   VLDL 36 08/17/2015 1233   LDLCALC 41 08/17/2015 1233    Physical Exam:    VS:  BP 132/80   Pulse 74   Ht 5\' 11"  (1.803 m)   Wt (!) 368 lb (166.9 kg)   SpO2 98%   BMI 51.33 kg/m     Wt Readings from Last 3 Encounters:  07/15/17 (!) 368 lb (166.9 kg)  07/09/17 (!) 356 lb 14.4 oz (161.9 kg)  03/03/17 (!) 351 lb 1.6 oz (159.3 kg)     GEN: obese male in no acute distress HEENT: Normal NECK: No JVD; No carotid bruits LYMPHATICS: No  lymphadenopathy CARDIAC: irregular rhythm, regular rate, heart sounds distant due to body habitus RESPIRATORY:  Respirations unlabored, expiratory wheezing throughout MUSCULOSKELETAL:  + LE edema with compression socks in place  SKIN: Warm and dry NEUROLOGIC:  Alert and oriented x 3 PSYCHIATRIC:  Normal affect   ASSESSMENT:    1. Chronic diastolic heart failure ([place])   2. Essential hypertension   3. Chronic atrial fibrillation ([place])   4. Morbid obesity ([place])   5. Current smoker    PLAN:    In order of problems listed above:  Chronic diastolic heart failure (HCC)  Pt has had a 9-12 lb weight gain since discharge. Because he denies orthopnea, SOB, DOE, and without JVD on exam, I will try to manage this weight gain as an outpatient. I increased his lasix to 80 mg TID x 3 days with increased potassium supplementation. He will then revert back to his home dose of lasix of 80 mg BID. He will call on Monday 07/21/17 with daily weights and lasix dosages. He expressed understanding of the plan. We will see him back in about 1 month or sooner if he continues to gain weight.  We will obtain BMP today to check potassium.   Essential hypertension Pressure controlled today. No change in medication. Continue low salt diet.  Chronic atrial fibrillation (HCC)  EKG with rate controlled Afib. Continue cardizem dose and xarelto. This patients CHA2DS2-VASc Score and unadjusted Ischemic Stroke Rate (% per year) is equal to 2.2 % stroke rate/year from a score of 2 (CHF, HTN)  Morbid obesity (HCC) Increase walking program slowly.  Current smoker Again, encouraged cessation.    Medication Adjustments/Labs and Tests Ordered: Current medicines are reviewed at length with the patient today.  Concerns regarding medicines are outlined above.  Orders Placed This Encounter  Procedures  . Basic Metabolic Panel (BMET)  . EKG 12-Lead   No orders of the defined types were placed in this  encounter.   Signed, Ledora Bottcher, PA  07/15/2017 11:28 AM    [facility]

## 2017-07-17 ENCOUNTER — Telehealth: Payer: Self-pay | Admitting: Physician Assistant

## 2017-07-17 MED ORDER — FUROSEMIDE 80 MG PO TABS: 80.0000 mg | ORAL_TABLET | ORAL | 3 refills | Status: DC

## 2017-07-17 NOTE — Telephone Encounter (Signed)
See previous note from [facility] about Lasix refill. I called the pt and explained that I did not send in an Rx due to this was just a temporary increase for 3 days for the lasix and then he would be resuming his current dose. I did states I will be happy to send in an RX for the lasix. Pt asked for refill to go to [facility] on [address].

## 2017-07-17 NOTE — Telephone Encounter (Signed)
Pt calling stating that when he was in for his appt on 07/15/17, Fabian Sharp, PA, changed his furosemide 40 mg tablets, pt taking 80 mg TID. This change in not on pt's medication list. Please correct the directions and the quantity and resend medication. Pt is waiting for his medication. Please address

## 2017-07-22 ENCOUNTER — Telehealth: Payer: Self-pay | Admitting: Internal Medicine

## 2017-07-22 NOTE — Telephone Encounter (Signed)
New Message:       Pt is calling to give [place] results of his weight check for a week. Pt was taking weight at 7 am/7 pm. Except for the last one which was today at 3:40   5/1:363.4(am/pm) 5/2:360.5/360.8 5/3:363.8/363.8 5/4:363.8/363.4 5/5:360.4/362.4 5/6:361.6/362.4 5/7:363.4/363.8(right now)

## 2017-07-22 NOTE — Telephone Encounter (Signed)
Will route to Doreene Adas PA for her review per her request at the patients last office visit.

## 2017-07-23 ENCOUNTER — Telehealth: Payer: Self-pay | Admitting: Nurse Practitioner

## 2017-07-23 NOTE — Telephone Encounter (Signed)
-----   Message from Ledora Bottcher, [place] sent at 07/22/2017  6:12 PM EDT ----- Pt appears to have a stable fluctuation in weight. Please call and make sure he is stable and not having increased swelling or shortness of breath. If asymptomatic, we will see him at his next appt. If he is symptomatic, we will need to see him in the office sooner.

## 2017-07-23 NOTE — Telephone Encounter (Signed)
Called patient to ask about symptoms associated with daily weights that he sent to [place]. Patient denies swelling, SOB or any other concerns. He is aware of his scheduled follow-up with V. Bhagat, PA on 5/30 and is aware to call back prior to appointment with questions or concerns. He thanked me for the call.

## 2017-08-14 ENCOUNTER — Ambulatory Visit (INDEPENDENT_AMBULATORY_CARE_PROVIDER_SITE_OTHER): Payer: Medicaid Other | Admitting: Physician Assistant

## 2017-08-14 ENCOUNTER — Encounter: Payer: Self-pay | Admitting: Physician Assistant

## 2017-08-14 VITALS — BP 118/72 | HR 105 | Ht 71.0 in | Wt 365.0 lb

## 2017-08-14 DIAGNOSIS — F17200 Nicotine dependence, unspecified, uncomplicated: Secondary | ICD-10-CM

## 2017-08-14 DIAGNOSIS — I10 Essential (primary) hypertension: Secondary | ICD-10-CM | POA: Diagnosis not present

## 2017-08-14 DIAGNOSIS — I5033 Acute on chronic diastolic (congestive) heart failure: Secondary | ICD-10-CM | POA: Diagnosis not present

## 2017-08-14 DIAGNOSIS — I5032 Chronic diastolic (congestive) heart failure: Secondary | ICD-10-CM | POA: Diagnosis not present

## 2017-08-14 DIAGNOSIS — I4892 Unspecified atrial flutter: Secondary | ICD-10-CM | POA: Diagnosis not present

## 2017-08-14 DIAGNOSIS — I4891 Unspecified atrial fibrillation: Secondary | ICD-10-CM | POA: Diagnosis not present

## 2017-08-14 NOTE — Progress Notes (Signed)
Cardiology Office Note    Date:  08/14/2017   ID:  Frank Morales, DOB May 16, 1960, MRN [id number]  PCP:  Thomasene Ripple, MD  Cardiologist:  Dr. Harrington Challenger  Chief Complaint: 4 weeks follow up  History of Present Illness:   Frank Morales is a 57 y.o. male with a hx of chronic diastolic heart failure, permanent atrial flutter with previous failed DCCV rate controlled on xarelto, OSA, hx of substance abuse, HCV, and negative CAD by cath 2017 presents for follow up.   Admitted 06/2017 for acute CHF exacerbation. Advised to cut back on salt and meds adjusted. Seen by clinic by APP 07/15/17. Continued to eat high sodium diet and noted 9lb  weight gain since discharge. Increased lasix for short term.   Here today for follow up.  Weight is fluctuating between 360-370lb. usually his weight is up after he eats high sodium diet.  He frequently eats outside & friend's  House.  He continues to smoke cigarettes.  Currently smokes 1 pack/week.  He still uses cocaine occasionally.  Alcohol drinking on weekends.  He has intermittent orthopnea with weight gain.  No syncope, PND, dizziness, melena or blood in his stool or urine.  Past Medical History:  Diagnosis Date  . Atrial fibrillation and flutter ([place]) 08/17/2015   A. S/p failed DCCV // b. Severe BAE on echo >> rate control strategy (has seen AF clinic) // c. Xarelto for anticoag (CHADS2-VASc=2 / CHF, HTN)  . Chronic diastolic CHF (congestive heart failure) ([place]) 08/30/2015   A. Echo 6/17: Apical HK, moderate focal basal and mild concentric LVH, EF 50-55, diffuse HK, trivial MR, severe BAE, mild TR, PASP 37  . Hepatitis C, chronic ([place]) 08/19/2015  . History of cardiac catheterization    a. LHC 6/17: LAD irregs, o/w no CAD  . OSA (obstructive sleep apnea) 11/21/2015  . Sleep apnea   . Tobacco abuse     Past Surgical History:  Procedure Laterality Date  . CARDIAC CATHETERIZATION N/A 08/21/2015   Procedure: Right/Left Heart Cath and Coronary  Angiography;  Surgeon: Leonie Man, MD;  Location: [facility];  Service: Cardiovascular;  Laterality: N/A;  . CARDIOVERSION N/A 09/22/2015   Procedure: CARDIOVERSION;  Surgeon: Josue Hector, MD;  Location: [facility]COPY;  Service: Cardiovascular;  Laterality: N/A;  . COLONOSCOPY N/A 07/19/2016   Procedure: COLONOSCOPY;  Surgeon: Doran Stabler, MD;  Location: [facility] ENDOSCOPY;  Service: Gastroenterology;  Laterality: N/A;  . ESOPHAGOGASTRODUODENOSCOPY N/A 07/19/2016   Procedure: ESOPHAGOGASTRODUODENOSCOPY (EGD);  Surgeon: Doran Stabler, MD;  Location: [facility];  Service: Gastroenterology;  Laterality: N/A;  . NO PAST SURGERIES      Current Medications: Prior to Admission medications   Medication Sig Start Date End Date Taking? Authorizing Provider  diltiazem (CARDIZEM CD) 360 MG 24 hr capsule Take 1 capsule (360 mg total) by mouth daily. 07/10/17   Hosie Poisson, MD  fluticasone (FLONASE) 50 MCG/ACT nasal spray Place 1 spray into both nostrils daily. 04/10/17   Thomasene Ripple, MD  furosemide (LASIX) 80 MG tablet Take 1 tablet (80 mg total) by mouth as directed. 1 tab 80 mg three times daily for 3 days then resume 1 tab 80 mg twice daily 07/17/17 10/15/17  Ledora Bottcher, PA  guaiFENesin (MUCINEX) 600 MG 12 hr tablet Take 1 tablet (600 mg total) by mouth 2 (two) times daily. 07/09/17   Hosie Poisson, MD  loratadine (CLARITIN) 10 MG tablet Take 1 tablet (10 mg total)  by mouth daily. 03/03/17   Thomasene Ripple, MD  nicotine (NICODERM CQ - DOSED IN MG/24 HOURS) 14 mg/24hr patch Place 1 patch (14 mg total) onto the skin daily. 07/10/17   Hosie Poisson, MD  potassium chloride (K-DUR) 10 MEQ tablet Take 1 tablet (10 mEq total) by mouth 2 (two) times daily. 04/10/17 07/09/17  Thomasene Ripple, MD  rivaroxaban (XARELTO) 20 MG TABS tablet Take 1 tablet (20 mg total) by mouth daily with supper. 04/10/17   Thomasene Ripple, MD    Allergies:   Lactose intolerance (gi)   Social History    Socioeconomic History  . Marital status: Single    Spouse name: Not on file  . Number of children: Not on file  . Years of education: Not on file  . Highest education level: Not on file  Occupational History  . Occupation: unemployed    Comment: Previous Soil scientist  . Financial resource strain: Not on file  . Food insecurity:    Worry: Not on file    Inability: Not on file  . Transportation needs:    Medical: Not on file    Non-medical: Not on file  Tobacco Use  . Smoking status: Current Every Day Smoker    Packs/day: 0.20    Types: Cigarettes  . Smokeless tobacco: Never Used  . Tobacco comment: down to 1-2 cig/day with NRT  Substance and Sexual Activity  . Alcohol use: Yes    Alcohol/week: 3.0 oz    Types: 5 Standard drinks or equivalent per week    Comment: 2 times a week beer  . Drug use: No  . Sexual activity: Not on file  Lifestyle  . Physical activity:    Days per week: Not on file    Minutes per session: Not on file  . Stress: Not on file  Relationships  . Social connections:    Talks on phone: Not on file    Gets together: Not on file    Attends religious service: Not on file    Active member of club or organization: Not on file    Attends meetings of clubs or organizations: Not on file    Relationship status: Not on file  Other Topics Concern  . Not on file  Social History Narrative  . Not on file     Family History:  The patient's family history includes Diabetes in his mother; Hypertension in his maternal grandfather and mother; Pneumonia in his father.   ROS:   Please see the history of present illness.    ROS All other systems reviewed and are negative.   PHYSICAL EXAM:   VS:  BP 118/72   Pulse (!) 105   Ht 5\' 11"  (1.803 m)   Wt (!) 365 lb (165.6 kg)   SpO2 93%   BMI 50.91 kg/m    GEN: Obese male in no acute distress  HEENT: normal  Neck: no JVD, carotid bruits, or masses Cardiac: Irregularly irregular; no murmurs, rubs, or  gallops,no edema  Respiratory:  clear to auscultation bilaterally, normal work of breathing GI: soft, nontender, nondistended, + BS MS: no deformity or atrophy  Skin: warm and dry, no rash Neuro:  Alert and Oriented x 3, Strength and sensation are intact Psych: euthymic mood, full affect  Wt Readings from Last 3 Encounters:  08/14/17 (!) 365 lb (165.6 kg)  07/15/17 (!) 368 lb (166.9 kg)  07/09/17 (!) 356 lb 14.4 oz (161.9 kg)  Studies/Labs Reviewed:   EKG:  EKG is ordered today.  The ekg ordered today demonstrates atrial fibrillation at rate of 105 bpm  Recent Labs: 01/07/2017: NT-Pro BNP 678 07/03/2017: ALT 18 07/05/2017: B Natriuretic Peptide 194.8; Hemoglobin 13.2; Platelets 201 07/08/2017: Magnesium 2.4 07/15/2017: BUN 17; Creatinine, Ser 0.72; Potassium 4.2; Sodium 139   Lipid Panel    Component Value Date/Time   CHOL 105 08/17/2015 1233   TRIG 182 (H) 08/17/2015 1233   HDL 28 (L) 08/17/2015 1233   CHOLHDL 3.8 08/17/2015 1233   VLDL 36 08/17/2015 1233   LDLCALC 41 08/17/2015 1233    Additional studies/ records that were reviewed today include:   Echocardiogram: 06/2017 Study Conclusions  - Left ventricle: The cavity size was mildly dilated. There was   moderate concentric hypertrophy. Systolic function was at the   lower limits of normal. The estimated ejection fraction was in   the range of 50% to 55%. Although no diagnostic regional wall   motion abnormality was identified, this possibility cannot be   completely excluded on the basis of this study. Acoustic contrast   opacification revealed no evidence ofthrombus. - Left atrium: The atrium was moderately dilated. - Pulmonary arteries: Systolic pressure was mildly increased. PA   peak pressure: 38 mm Hg (S).  Impressions:  - Difficult study, even with Definity.  Right/Left Heart Cath and Coronary Angiography   08/21/2015  Conclusion    Angiographically minimal coronary artery disease. Large draping  vessels. Several very tortuous  Mildly elevated secondary pulmonary hypertension   Mildly elevated signal April medication for elevated LVEDP. Suggest more diastolic dysfunction  Plan:   ASSESSMENT & PLAN:    1. Chronic diastolic CHF -Fluctuating weight depending on salt intake.  I have spent greater than 20 minutes on education.  He is willing to try.  Compliance will be big issue.  Continue Lasix 80 mg twice daily with supplemental potassium.  He takes extra Lasix as needed, mostly 2 times per week.  2. Persistent atrial fibrillation -Rate minimally elevated.  Asymptomatic.  Continue Cardizem CD 360 mg daily.  Unable to add beta-blocker due to cocaine abuse.  3. HTN -Blood pressure stable on current medication.  4.  Polysubstance abuse -Advised complete cessation.  Education given.  Medication Adjustments/Labs and Tests Ordered: Current medicines are reviewed at length with the patient today.  Concerns regarding medicines are outlined above.  Medication changes, Labs and Tests ordered today are listed in the Patient Instructions below. Patient Instructions  Medication Instructions:  Your physician recommends that you continue on your current medications as directed. Please refer to the Current Medication list given to you today.   Labwork: None ordered  Testing/Procedures: None ordered  Follow-Up: Your physician recommends that you schedule a follow-up appointment in: [place] DR. ROSS IN June    Any Other Special Instructions Will Be Listed Below (If Applicable).     If you need a refill on your cardiac medications before your next appointment, please call your pharmacy.      Jarrett Soho, [place]  08/14/2017 12:17 PM    [facility] [place], [place], [place]  [address] Phone: [phone]; Fax: [phone]

## 2017-08-14 NOTE — Patient Instructions (Addendum)
Medication Instructions:  Your physician recommends that you continue on your current medications as directed. Please refer to the Current Medication list given to you today.   Labwork: None ordered  Testing/Procedures: None ordered  Follow-Up: Your physician recommends that you schedule a follow-up appointment in: [place] DR. ROSS IN June    Any Other Special Instructions Will Be Listed Below (If Applicable).     If you need a refill on your cardiac medications before your next appointment, please call your pharmacy.

## 2017-08-18 ENCOUNTER — Other Ambulatory Visit: Payer: Self-pay | Admitting: *Deleted

## 2017-08-18 ENCOUNTER — Telehealth: Payer: Self-pay | Admitting: Physician Assistant

## 2017-08-18 MED ORDER — DILTIAZEM HCL ER COATED BEADS 360 MG PO CP24: 360.0000 mg | ORAL_CAPSULE | Freq: Every day | ORAL | 3 refills | Status: DC

## 2017-08-18 NOTE — Telephone Encounter (Signed)
New Message     *STAT* If patient is at the pharmacy, call can be transferred to refill team.   1. Which medications need to be refilled? (please list name of each medication and dose if known) diltiazem (CARDIZEM CD) 360 MG 24 hr capsule  2. Which pharmacy/location (including street and city if local pharmacy) is medication to be sent to? [facility] [phone] - [place], [place] - [address] AT [address]  3. Do they need a 30 day or 90 day supply? 90 day

## 2017-09-03 ENCOUNTER — Encounter: Payer: Self-pay | Admitting: *Deleted

## 2017-09-12 ENCOUNTER — Ambulatory Visit (INDEPENDENT_AMBULATORY_CARE_PROVIDER_SITE_OTHER): Payer: Medicaid Other | Admitting: Internal Medicine

## 2017-09-12 ENCOUNTER — Encounter: Payer: Self-pay | Admitting: Internal Medicine

## 2017-09-12 VITALS — BP 130/68 | HR 99 | Ht 71.0 in | Wt 370.0 lb

## 2017-09-12 DIAGNOSIS — I4892 Unspecified atrial flutter: Secondary | ICD-10-CM

## 2017-09-12 DIAGNOSIS — I4891 Unspecified atrial fibrillation: Secondary | ICD-10-CM

## 2017-09-12 DIAGNOSIS — E876 Hypokalemia: Secondary | ICD-10-CM

## 2017-09-12 DIAGNOSIS — R0981 Nasal congestion: Secondary | ICD-10-CM

## 2017-09-12 DIAGNOSIS — I5033 Acute on chronic diastolic (congestive) heart failure: Secondary | ICD-10-CM | POA: Diagnosis not present

## 2017-09-12 MED ORDER — RIVAROXABAN 20 MG PO TABS: 20.0000 mg | ORAL_TABLET | Freq: Every day | ORAL | 3 refills | Status: DC

## 2017-09-12 MED ORDER — POTASSIUM CHLORIDE ER 10 MEQ PO TBCR: 10.0000 meq | EXTENDED_RELEASE_TABLET | Freq: Two times a day (BID) | ORAL | 3 refills | Status: DC

## 2017-09-12 MED ORDER — LORATADINE 10 MG PO TABS: 10.0000 mg | ORAL_TABLET | Freq: Every day | ORAL | 3 refills | Status: DC

## 2017-09-12 NOTE — Patient Instructions (Addendum)
Medication Instructions:  Your physician recommends that you continue on your current medications as directed. Please refer to the Current Medication list given to you today.   Labwork: Your physician recommends that you have lab work today for BMET, BNP.   Testing/Procedures: None ordered    Follow-Up: Your physician recommends that you schedule a follow-up appointment in 6 weeks with Dr. Dorris Carnes or Robbie Lis PA    Any Other Special Instructions Will Be Listed Below (If Applicable).     If you need a refill on your cardiac medications before your next appointment, please call your pharmacy.

## 2017-09-12 NOTE — Progress Notes (Signed)
Cardiology Office Note   Date:  09/12/2017   ID:  Frank Morales, MRN [id number]  PCP:  [name], MD  Cardiologist:   Frank Carnes, MD   F/U of atrial flutter and diastolic CHF      History of Present Illness: Frank Morales is a 57 y.o. male with a history of atrial flutter/fibrillaion (faliled cardioversion), diastolic CHF, OSA, substance abuse.  Cath in June 2017  No signif CAD   I saw him in May 2018    The pt was hospitalized in April 2019 with exacerbation of Diastolic CHF   Pt admitted to consuming extra sodium   Ehcho done   LA was severely dilated.  LVEF normal  Discharge wt was 356 lb   Sent home on 80 bid lasix   cardiazem was 360 mg daily He was seen by [facility] in April  Wt 368    Since seen he deneis palpitations   His wt is up    Current Meds  Medication Sig  . diltiazem (CARDIZEM CD) 360 MG 24 hr capsule Take 1 capsule (360 mg total) by mouth daily.  . fluticasone (FLONASE) 50 MCG/ACT nasal spray Place 1 spray into both nostrils daily.  . furosemide (LASIX) 80 MG tablet Take 1 tablet (80 mg total) by mouth as directed. 1 tab 80 mg three times daily for 3 days then resume 1 tab 80 mg twice daily  . guaiFENesin (MUCINEX) 600 MG 12 hr tablet Take 1 tablet (600 mg total) by mouth 2 (two) times daily.  Marland Kitchen loratadine (CLARITIN) 10 MG tablet Take 1 tablet (10 mg total) by mouth daily.  . nicotine (NICODERM CQ - DOSED IN MG/24 HOURS) 14 mg/24hr patch Place 1 patch (14 mg total) onto the skin daily.  . rivaroxaban (XARELTO) 20 MG TABS tablet Take 1 tablet (20 mg total) by mouth daily with supper.     Allergies:   Lactose intolerance (gi)   Past Medical History:  Diagnosis Date  . Atrial fibrillation and flutter ([place]) 08/17/2015   A. S/p failed DCCV // b. Severe BAE on echo >> rate control strategy (has seen AF clinic) // c. Xarelto for anticoag (CHADS2-VASc=2 / CHF, HTN)  . Chronic diastolic CHF (congestive heart failure) ([place]) 08/30/2015   A.  Echo 6/17: Apical HK, moderate focal basal and mild concentric LVH, EF 50-55, diffuse HK, trivial MR, severe BAE, mild TR, PASP 37  . Hepatitis C, chronic ([place]) 08/19/2015  . History of cardiac catheterization    a. LHC 6/17: LAD irregs, o/w no CAD  . OSA (obstructive sleep apnea) 11/21/2015  . Sleep apnea   . Tobacco abuse     Past Surgical History:  Procedure Laterality Date  . CARDIAC CATHETERIZATION N/A 08/21/2015   Procedure: Right/Left Heart Cath and Coronary Angiography;  Surgeon: Frank Man, MD;  Location: [facility] [facility];  Service: Cardiovascular;  Laterality: N/A;  . CARDIOVERSION N/A 09/22/2015   Procedure: CARDIOVERSION;  Surgeon: Frank Hector, MD;  Location: [facility] ENDOSCOPY;  Service: Cardiovascular;  Laterality: N/A;  . COLONOSCOPY N/A 07/19/2016   Procedure: COLONOSCOPY;  Surgeon: Frank Stabler, MD;  Location: [facility]OSCOPY;  Service: Gastroenterology;  Laterality: N/A;  . ESOPHAGOGASTRODUODENOSCOPY N/A 07/19/2016   Procedure: ESOPHAGOGASTRODUODENOSCOPY (EGD);  Surgeon: Frank Stabler, MD;  Location: [facility];  Service: Gastroenterology;  Laterality: N/A;  . NO PAST SURGERIES       Social History:  The patient  reports  that he has been smoking cigarettes.  He has been smoking about 0.20 packs per day. He has never used smokeless tobacco. He reports that he drinks about 3.0 oz of alcohol per week. He reports that he does not use drugs.   Family History:  The patient's family history includes Diabetes in his mother; Hypertension in his maternal grandfather and mother; Pneumonia in his father.    ROS:  Please see the history of present illness. All other systems are reviewed and  Negative to the above problem except as noted.    PHYSICAL EXAM: VS:  BP 130/68   Pulse 99   Ht 5\' 11"  (1.803 m)   Wt (!) 167.8 kg (370 lb)   BMI 51.60 kg/m   GEN: Morbidly obese 57 yo  in no acute distress  HEENT: normal  Neck: no JVD, carotid bruits, or masses  Neck full     Cardiac:Irreg irreg    no murmurs, rubs, or gallops,2+ edema  Respiratory:  clear to auscultation bilaterally, normal work of breathing GI: soft, nontender, nondistended, + BS  No hepatomegaly  MS: no deformity Moving all extremities   Skin: warm and dry, no rash Neuro:  Strength and sensation are intact Psych: euthymic mood, full affect   EKG:  EKG is  ordered today.  Atrial fibrillation  99 pbm  Nonspecific ST changes     Lipid Panel    Component Value Date/Time   CHOL 105 08/17/2015 1233   TRIG 182 (H) 08/17/2015 1233   HDL 28 (L) 08/17/2015 1233   CHOLHDL 3.8 08/17/2015 1233   VLDL 36 08/17/2015 1233   LDLCALC 41 08/17/2015 1233      Wt Readings from Last 3 Encounters:  09/12/17 (!) 167.8 kg (370 lb)  08/14/17 (!) 165.6 kg (365 lb)  07/15/17 (!) 166.9 kg (368 lb)      ASSESSMENT AND PLAN:  1  Atrail fibrillatoin     Continue rate control and anticoagulation    2  Chronic diastlic CHF ]Volume is up   He is adding some spices with salt to food   REviewed with him    Will get labs today   Start metalozone 2.5 30 min before lasix    Low salt diet  3  Sleep apnea  PCP to organize  Work up  4.   Morbid obesity   Needs to lose wt.       Current medicines are reviewed at length with the patient today.  The patient does not have concerns regarding medicines.  Signed, Frank Carnes, MD  09/12/2017 4:20 PM    [facility] [address] [place] [place], [place], [place]  [address] Phone: [phone]; Fax: [phone]

## 2017-09-13 LAB — BASIC METABOLIC PANEL
BUN / CREAT RATIO: 10 (ref 9–20)
BUN: 8 mg/dL (ref 6–24)
CALCIUM: 9 mg/dL (ref 8.7–10.2)
CO2: 30 mmol/L — AB (ref 20–29)
Chloride: 96 mmol/L (ref 96–106)
Creatinine, Ser: 0.8 mg/dL (ref 0.76–1.27)
GFR calc Af Amer: 116 mL/min/{1.73_m2} (ref >59)
GFR calc non Af Amer: 101 mL/min/{1.73_m2} (ref >59)
GLUCOSE: 101 mg/dL — AB (ref 65–99)
POTASSIUM: 4.2 mmol/L (ref 3.5–5.2)
SODIUM: 141 mmol/L (ref 134–144)

## 2017-09-13 LAB — PRO B NATRIURETIC PEPTIDE: NT-PRO BNP: 382 pg/mL — AB (ref 0–210)

## 2017-09-15 ENCOUNTER — Other Ambulatory Visit: Payer: Self-pay

## 2017-09-15 DIAGNOSIS — I10 Essential (primary) hypertension: Secondary | ICD-10-CM

## 2017-09-15 MED ORDER — METOLAZONE 2.5 MG PO TABS: ORAL_TABLET | ORAL | 3 refills | Status: DC

## 2017-09-24 ENCOUNTER — Telehealth: Payer: Self-pay | Admitting: Internal Medicine

## 2017-09-24 ENCOUNTER — Other Ambulatory Visit: Payer: Medicaid Other

## 2017-09-24 DIAGNOSIS — E876 Hypokalemia: Secondary | ICD-10-CM

## 2017-09-24 DIAGNOSIS — I10 Essential (primary) hypertension: Secondary | ICD-10-CM

## 2017-09-24 DIAGNOSIS — I5033 Acute on chronic diastolic (congestive) heart failure: Secondary | ICD-10-CM

## 2017-09-24 DIAGNOSIS — I4891 Unspecified atrial fibrillation: Secondary | ICD-10-CM

## 2017-09-24 DIAGNOSIS — I4892 Unspecified atrial flutter: Secondary | ICD-10-CM

## 2017-09-24 LAB — BASIC METABOLIC PANEL
BUN/Creatinine Ratio: 18 (ref 9–20)
BUN: 20 mg/dL (ref 6–24)
CALCIUM: 9.9 mg/dL (ref 8.7–10.2)
CHLORIDE: 75 mmol/L — AB (ref 96–106)
CO2: 34 mmol/L — AB (ref 20–29)
Creatinine, Ser: 1.13 mg/dL (ref 0.76–1.27)
GFR calc Af Amer: 84 mL/min/{1.73_m2} (ref >59)
GFR calc non Af Amer: 73 mL/min/{1.73_m2} (ref >59)
GLUCOSE: 100 mg/dL — AB (ref 65–99)
Potassium: 2.6 mmol/L — CL (ref 3.5–5.2)
Sodium: 131 mmol/L — ABNORMAL LOW (ref 134–144)

## 2017-09-24 MED ORDER — METOLAZONE 2.5 MG PO TABS: ORAL_TABLET | ORAL | 3 refills | Status: DC

## 2017-09-24 MED ORDER — POTASSIUM CHLORIDE ER 10 MEQ PO TBCR: 20.0000 meq | EXTENDED_RELEASE_TABLET | Freq: Two times a day (BID) | ORAL | 3 refills | Status: DC

## 2017-09-24 NOTE — Telephone Encounter (Signed)
Santiago Glad from [place] called critical lab K of 2.6. Brought to Dr Rayann Heman (DOD). He has ordered: Increase K-Dur 76mEq bid, decrease Metolazone 2.5mg  two times per week, and repeat BMP on Friday July 12. Pt stated he has already taken his Metolazone two times this week. He will resume next Monday and Thursday. He repeated and verbalized understanding of medication changes. He has agreed to repeat BMP on Friday July 12th.

## 2017-09-24 NOTE — Telephone Encounter (Signed)
Have critical labs

## 2017-09-26 ENCOUNTER — Other Ambulatory Visit: Payer: Medicaid Other

## 2017-09-26 ENCOUNTER — Telehealth: Payer: Self-pay | Admitting: Internal Medicine

## 2017-09-26 ENCOUNTER — Telehealth: Payer: Self-pay | Admitting: Nurse Practitioner

## 2017-09-26 DIAGNOSIS — I4891 Unspecified atrial fibrillation: Secondary | ICD-10-CM | POA: Diagnosis not present

## 2017-09-26 DIAGNOSIS — E876 Hypokalemia: Secondary | ICD-10-CM | POA: Diagnosis not present

## 2017-09-26 DIAGNOSIS — I4892 Unspecified atrial flutter: Secondary | ICD-10-CM | POA: Diagnosis not present

## 2017-09-26 DIAGNOSIS — I5033 Acute on chronic diastolic (congestive) heart failure: Secondary | ICD-10-CM | POA: Diagnosis not present

## 2017-09-26 LAB — BASIC METABOLIC PANEL
BUN / CREAT RATIO: 21 — AB (ref 9–20)
BUN: 20 mg/dL (ref 6–24)
CO2: 36 mmol/L — ABNORMAL HIGH (ref 20–29)
CREATININE: 0.96 mg/dL (ref 0.76–1.27)
Calcium: 9.7 mg/dL (ref 8.7–10.2)
Chloride: 72 mmol/L — CL (ref 96–106)
GFR calc Af Amer: 102 mL/min/{1.73_m2} (ref >59)
GFR, EST NON AFRICAN AMERICAN: 89 mL/min/{1.73_m2} (ref >59)
Glucose: 115 mg/dL — ABNORMAL HIGH (ref 65–99)
Potassium: 2.4 mmol/L — CL (ref 3.5–5.2)
SODIUM: 129 mmol/L — AB (ref 134–144)

## 2017-09-26 NOTE — Telephone Encounter (Signed)
See additional telephone encounter dated 09/26/17

## 2017-09-26 NOTE — Telephone Encounter (Signed)
Reviewed medications instructions with Christen Bame, RN. Clarified that patient will resume 20 mEq potassium BID tomorrow - not 40 meQ BID and that patient is aware of this plan.

## 2017-09-26 NOTE — Telephone Encounter (Signed)
Has critical labs

## 2017-09-26 NOTE — Telephone Encounter (Signed)
Received call from [facility] regarding critical low K+. Called patient to review medications and patient verbalized adherence to cardiac medications as prescribed. He states he eats high K+ foods daily. He states he has lost 21 lbs in the last month and has been drinking more water. I advised that I will review with DOD and call him back with their advice. He thanked me for the call.  Reviewed with Dr. Harrington Challenger, primary cardiologist. She advised patient d/c Metolazone, take KCl 40 mEq now and 40 mEq at 8 pm today; resume 40 mEq BID tomorrow, repeat BMET on Tuesday 7/16  I reviewed Dr. Harrington Challenger' instructions with patient who verbalized understanding. He states he also just ate a banana.

## 2017-09-30 ENCOUNTER — Other Ambulatory Visit: Payer: Medicaid Other

## 2017-09-30 DIAGNOSIS — E876 Hypokalemia: Secondary | ICD-10-CM | POA: Diagnosis not present

## 2017-10-01 ENCOUNTER — Other Ambulatory Visit: Payer: Self-pay | Admitting: *Deleted

## 2017-10-01 DIAGNOSIS — E876 Hypokalemia: Secondary | ICD-10-CM

## 2017-10-01 DIAGNOSIS — I4891 Unspecified atrial fibrillation: Secondary | ICD-10-CM

## 2017-10-01 DIAGNOSIS — I4892 Unspecified atrial flutter: Secondary | ICD-10-CM

## 2017-10-01 DIAGNOSIS — I5033 Acute on chronic diastolic (congestive) heart failure: Secondary | ICD-10-CM

## 2017-10-01 LAB — BASIC METABOLIC PANEL
BUN / CREAT RATIO: 16 (ref 9–20)
BUN: 16 mg/dL (ref 6–24)
CO2: 29 mmol/L (ref 20–29)
Calcium: 9.9 mg/dL (ref 8.7–10.2)
Chloride: 85 mmol/L — ABNORMAL LOW (ref 96–106)
Creatinine, Ser: 1.02 mg/dL (ref 0.76–1.27)
GFR, EST AFRICAN AMERICAN: 95 mL/min/{1.73_m2} (ref >59)
GFR, EST NON AFRICAN AMERICAN: 82 mL/min/{1.73_m2} (ref >59)
Glucose: 114 mg/dL — ABNORMAL HIGH (ref 65–99)
Potassium: 3.3 mmol/L — ABNORMAL LOW (ref 3.5–5.2)
SODIUM: 136 mmol/L (ref 134–144)

## 2017-10-01 MED ORDER — POTASSIUM CHLORIDE ER 10 MEQ PO TBCR: EXTENDED_RELEASE_TABLET | ORAL | 3 refills | Status: DC

## 2017-10-01 MED ORDER — FUROSEMIDE 80 MG PO TABS: ORAL_TABLET | ORAL | 3 refills | Status: DC

## 2017-10-01 NOTE — Progress Notes (Signed)
Pt's potassium level is 3.3 Dr Lovena Le DOD recommended for pt to increased Kdurn to 30 mEq twice a day. Pt is aware to take 3 10 mEq tablets by mouth twice a day. Pt verbalized understanding.

## 2017-10-23 ENCOUNTER — Ambulatory Visit (HOSPITAL_COMMUNITY)
Admission: EM | Admit: 2017-10-23 | Discharge: 2017-10-23 | Disposition: A | Discharge status: Home / Self Care | Payer: Medicaid Other | Attending: Family Medicine | Admitting: Family Medicine

## 2017-10-23 ENCOUNTER — Encounter: Payer: Self-pay | Admitting: Physician Assistant

## 2017-10-23 ENCOUNTER — Ambulatory Visit (INDEPENDENT_AMBULATORY_CARE_PROVIDER_SITE_OTHER): Payer: Medicaid Other

## 2017-10-23 ENCOUNTER — Encounter (HOSPITAL_COMMUNITY): Payer: Self-pay | Admitting: Family Medicine

## 2017-10-23 ENCOUNTER — Ambulatory Visit (INDEPENDENT_AMBULATORY_CARE_PROVIDER_SITE_OTHER): Payer: Medicaid Other | Admitting: Physician Assistant

## 2017-10-23 VITALS — BP 102/68 | HR 95 | Ht 71.0 in | Wt 350.4 lb

## 2017-10-23 DIAGNOSIS — M7989 Other specified soft tissue disorders: Secondary | ICD-10-CM | POA: Diagnosis not present

## 2017-10-23 DIAGNOSIS — L03116 Cellulitis of left lower limb: Secondary | ICD-10-CM | POA: Diagnosis not present

## 2017-10-23 DIAGNOSIS — M79641 Pain in right hand: Secondary | ICD-10-CM | POA: Diagnosis not present

## 2017-10-23 DIAGNOSIS — I481 Persistent atrial fibrillation: Secondary | ICD-10-CM

## 2017-10-23 DIAGNOSIS — M25439 Effusion, unspecified wrist: Secondary | ICD-10-CM

## 2017-10-23 DIAGNOSIS — S63501A Unspecified sprain of right wrist, initial encounter: Secondary | ICD-10-CM | POA: Diagnosis not present

## 2017-10-23 DIAGNOSIS — I4819 Other persistent atrial fibrillation: Secondary | ICD-10-CM

## 2017-10-23 DIAGNOSIS — R2231 Localized swelling, mass and lump, right upper limb: Secondary | ICD-10-CM | POA: Diagnosis not present

## 2017-10-23 DIAGNOSIS — I5033 Acute on chronic diastolic (congestive) heart failure: Secondary | ICD-10-CM

## 2017-10-23 DIAGNOSIS — I110 Hypertensive heart disease with heart failure: Secondary | ICD-10-CM

## 2017-10-23 LAB — BASIC METABOLIC PANEL
BUN/Creatinine Ratio: 12 (ref 9–20)
BUN: 10 mg/dL (ref 6–24)
CO2: 25 mmol/L (ref 20–29)
CREATININE: 0.81 mg/dL (ref 0.76–1.27)
Calcium: 9 mg/dL (ref 8.7–10.2)
Chloride: 100 mmol/L (ref 96–106)
GFR calc Af Amer: 116 mL/min/{1.73_m2} (ref >59)
GFR, EST NON AFRICAN AMERICAN: 100 mL/min/{1.73_m2} (ref >59)
Glucose: 128 mg/dL — ABNORMAL HIGH (ref 65–99)
Potassium: 4.1 mmol/L (ref 3.5–5.2)
SODIUM: 139 mmol/L (ref 134–144)

## 2017-10-23 MED ORDER — DOXYCYCLINE HYCLATE 100 MG PO CAPS: 100.0000 mg | ORAL_CAPSULE | Freq: Two times a day (BID) | ORAL | 0 refills | Status: DC

## 2017-10-23 MED ORDER — NAPROXEN 375 MG PO TABS: 375.0000 mg | ORAL_TABLET | Freq: Two times a day (BID) | ORAL | 0 refills | Status: AC | PRN
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
## 2017-10-23 NOTE — Discharge Instructions (Addendum)
Wrist brace placed Continue conservative management of rest, ice, compression with brace, and elevation Take naproxen as needed for pain relief (may cause abdominal discomfort, ulcers, and GI bleeds avoid taking with other NSAIDs) Pt reports tolerating NSAIDs in the past. Discussed risk of GI bleed.  Will treat with short course to help with pain and inflammation  Wash wound daily with warm water and mild soap.   Change dressing daily Prescribed doxycycline.  Take as directed and to completion   Follow up with PCP next week for reevalution Return or go to the ER if you have any new or worsening symptoms (fever, chills, chest pain, abdominal pain, changes in bowel or bladder habits, pain radiating into lower legs, etc...)
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
## 2017-10-23 NOTE — Patient Instructions (Addendum)
Medication Instructions:  1. Your physician recommends that you continue on your current medications as directed. Please refer to the Current Medication list given to you today.   Labwork: TODAY BMET  Testing/Procedures: NONE ORDERED TODAY  Follow-Up: 4 MONTHS WITH DR. ROSS ON 02/23/18 @ 11:20 AM   Any Other Special Instructions Will Be Listed Below (If Applicable).   YOU HAVE BEEN ADVISED TO GO TODAY TO THE ER, URGENT CARE OR YOUR PRIMARY CARE PHYSICIAN FOR YOUR WRIST  If you need a refill on your cardiac medications before your next appointment, please call your pharmacy.

## 2017-11-05 ENCOUNTER — Telehealth: Payer: Self-pay | Admitting: Internal Medicine

## 2017-11-05 DIAGNOSIS — I4891 Unspecified atrial fibrillation: Secondary | ICD-10-CM

## 2017-11-05 DIAGNOSIS — I5033 Acute on chronic diastolic (congestive) heart failure: Secondary | ICD-10-CM

## 2017-11-05 DIAGNOSIS — I4892 Unspecified atrial flutter: Secondary | ICD-10-CM

## 2017-11-05 DIAGNOSIS — E876 Hypokalemia: Secondary | ICD-10-CM

## 2017-11-05 MED ORDER — POTASSIUM CHLORIDE ER 10 MEQ PO TBCR: EXTENDED_RELEASE_TABLET | ORAL | 3 refills | Status: DC

## 2017-11-05 NOTE — Telephone Encounter (Signed)
New Message      *STAT* If patient is at the pharmacy, call can be transferred to refill team.   1. Which medications need to be refilled? (please list name of each medication and dose if known) potassium chloride (K-DUR) 10 MEQ tablet  Take 3 (10 mEq) tablets by mouth 2 times a day   2. Which pharmacy/location (including street and city if local pharmacy) is medication to be sent to? [facility] [phone] - [place], [place] - [address] AT [place]  3. Do they need a 30 day or 90 day supply? 90   Patient states that the pharmacy needs the new rx since his dosage has changed.

## 2017-11-05 NOTE — Telephone Encounter (Signed)
Lynann Bologna, RN at 10/01/2017 11:43 AM   Status: Signed    Pt's potassium level is 3.3 Dr Lovena Le DOD recommended for pt to increased Kdurn to 30 mEq twice a day. Pt is aware to take 3 10 mEq tablets by mouth twice a day. Pt verbalized understanding     Pt's medication was sent to pt's pharmacy as requested. Confirmation received.

## 2017-12-22 ENCOUNTER — Ambulatory Visit (INDEPENDENT_AMBULATORY_CARE_PROVIDER_SITE_OTHER): Payer: Medicaid Other | Admitting: Internal Medicine

## 2017-12-22 VITALS — BP 119/70 | HR 69 | Temp 98.9°F | Wt 344.9 lb

## 2017-12-22 DIAGNOSIS — R438 Other disturbances of smell and taste: Secondary | ICD-10-CM

## 2017-12-22 DIAGNOSIS — R0981 Nasal congestion: Secondary | ICD-10-CM

## 2017-12-22 DIAGNOSIS — I4892 Unspecified atrial flutter: Secondary | ICD-10-CM

## 2017-12-22 DIAGNOSIS — Z79899 Other long term (current) drug therapy: Secondary | ICD-10-CM

## 2017-12-22 DIAGNOSIS — I110 Hypertensive heart disease with heart failure: Secondary | ICD-10-CM

## 2017-12-22 DIAGNOSIS — Z23 Encounter for immunization: Secondary | ICD-10-CM | POA: Diagnosis not present

## 2017-12-22 DIAGNOSIS — I4891 Unspecified atrial fibrillation: Secondary | ICD-10-CM | POA: Diagnosis not present

## 2017-12-22 DIAGNOSIS — Z98818 Other dental procedure status: Secondary | ICD-10-CM

## 2017-12-22 DIAGNOSIS — R634 Abnormal weight loss: Secondary | ICD-10-CM

## 2017-12-22 DIAGNOSIS — I5033 Acute on chronic diastolic (congestive) heart failure: Secondary | ICD-10-CM | POA: Diagnosis not present

## 2017-12-22 DIAGNOSIS — Z6841 Body Mass Index (BMI) 40.0 and over, adult: Secondary | ICD-10-CM | POA: Diagnosis not present

## 2017-12-22 DIAGNOSIS — I10 Essential (primary) hypertension: Secondary | ICD-10-CM

## 2017-12-22 DIAGNOSIS — R439 Unspecified disturbances of smell and taste: Secondary | ICD-10-CM | POA: Diagnosis not present

## 2017-12-22 DIAGNOSIS — F17210 Nicotine dependence, cigarettes, uncomplicated: Secondary | ICD-10-CM

## 2017-12-22 HISTORY — DX: Other disturbances of smell and taste: R43.8

## 2017-12-22 MED ORDER — FUROSEMIDE 80 MG PO TABS: ORAL_TABLET | ORAL | 3 refills | Status: DC

## 2017-12-22 MED ORDER — AZELASTINE HCL 0.1 % NA SOLN: 2.0000 | Freq: Two times a day (BID) | NASAL | 12 refills | Status: DC

## 2017-12-22 MED ORDER — DILTIAZEM HCL ER COATED BEADS 360 MG PO CP24: 360.0000 mg | ORAL_CAPSULE | Freq: Every day | ORAL | 3 refills | Status: DC

## 2017-12-22 MED ORDER — GUAIFENESIN ER 600 MG PO TB12: 600.0000 mg | ORAL_TABLET | Freq: Two times a day (BID) | ORAL | 0 refills | Status: DC

## 2017-12-22 MED ORDER — FEXOFENADINE HCL 60 MG PO TABS: 60.0000 mg | ORAL_TABLET | Freq: Two times a day (BID) | ORAL | 3 refills | Status: DC

## 2017-12-22 MED ORDER — RIVAROXABAN 20 MG PO TABS: 20.0000 mg | ORAL_TABLET | Freq: Every day | ORAL | 3 refills | Status: DC

## 2017-12-22 NOTE — Patient Instructions (Signed)
[name],  It was a pleasure to meet you today. Thank you for coming in.   Today we discussed your decreased smell and sinus issues.  We have made a referral to the ENT doctor to get the lack of smell evaluated.  Please START azelastine for your sinus congestion and STOP Flonase, if your symptoms don't improve with azelastine you can restart the Flonase.  Please START allegra for your sinus congestion, sometimes different antihistamines can provide more relief in certain patients.    We also discussed your heart failure and hypertension. It appears that these are well controlled. Please continue your medications as prescribed.   Please return to clinic in 6 months or sooner if needed.   Thank you again for coming in.   [name]

## 2017-12-22 NOTE — Assessment & Plan Note (Addendum)
Assessment: Patient is on diltiazem 240 mg daily and Lasix 80 mg twice daily.  Blood pressure today is 119/70.  He is at his goal of <140/90.   Plan: -Continue diltiazem 240 mg daily, Lasix 80 mg twice daily - Patient has follow-up with cardiology in December - BMP today - Return to clinic in 6 months

## 2017-12-22 NOTE — Assessment & Plan Note (Signed)
Assessment: Patient reports that he has been having a lot of sinus pressure, nasal congestion, dry cough, and difficulty breathing through nostrils.  He also reports that he has not had a sense of smell for about 6 to 7 months.  He denies any other allergic issues including teary eyes, itchy eyes, runny nose, ear pressure, or headaches.  He is currently taking Claritin 10 mg daily, Mucinex 600 mg twice a day, and Flonase 1 spray at night.  He states that he is still having symptoms.  On exam he does have sinus pressure, erythematous nostrils with some enlarged turbinates.   Plan: -Switch from Claritin to Allegra 60 mg twice daily - Switch from Flonase to Azelastine up to 2 sprays twice a day -Told patient that he can switch back to previous medications if symptoms do not improve with the new change. - Continue Mucinex -Continue to encourage smoking cessation, patient is using nicotine gum

## 2017-12-22 NOTE — Assessment & Plan Note (Signed)
Assessment: Patient denied any palpitations or lightheadedness today.  He is currently on diltiazem 240 mg daily.  Well-controlled.  Plan: - Refill diltiazem - Follow-up with cardiology in December

## 2017-12-22 NOTE — Progress Notes (Signed)
CC: Follow-up and decreased sense of smell  HPI:  [name] is a 57 y.o. with a PMH listed below presenting for follow up of his chronic medical conditions. He reports that he has been unable to smell for 6-7 months, had a wisdom teeth pulled around this time and has had a gradual decrease in his sense of smell since then. He reports that his foods have been tasting different, states that it is more bland. He reports that he is able to tell if something is burning but states that it's because he will choke on the smoke is not sure if he is actually smiling. He states that he did not have any precipitating factors such as head injury, viral infection, or other types of trauma during this time.  He has been having sinus issues including sinus congestion, nasal congestion, and a dry cough.  And he has been taking Claritin, Flonase and Mucinex for this which he states does not help too much.  He reports that he has not had any time.  Where his sense of smell has returned even after using the Flonase and having some relief of his nasal congestion.  Please see A&P for status of the patient's chronic medical conditions  Past Medical History:  Diagnosis Date  . Atrial fibrillation and flutter ([place]) 08/17/2015   A. S/p failed DCCV // b. Severe BAE on echo >> rate control strategy (has seen AF clinic) // c. Xarelto for anticoag (CHADS2-VASc=2 / CHF, HTN)  . Chronic diastolic CHF (congestive heart failure) ([place]) 08/30/2015   A. Echo 6/17: Apical HK, moderate focal basal and mild concentric LVH, EF 50-55, diffuse HK, trivial MR, severe BAE, mild TR, PASP 37  . Hepatitis C, chronic ([place]) 08/19/2015  . History of cardiac catheterization    a. LHC 6/17: LAD irregs, o/w no CAD  . OSA (obstructive sleep apnea) 11/21/2015  . Sleep apnea   . Tobacco abuse    Review of Systems:  Review of Systems  Constitutional: Positive for weight loss. Negative for chills, fever and malaise/fatigue.  HENT: Positive for  congestion and sinus pain. Negative for ear pain.   Eyes: Negative for pain and discharge.  Respiratory: Positive for shortness of breath. Negative for cough and wheezing.   Cardiovascular: Positive for leg swelling (bilateral 1+ LE edema). Negative for chest pain, palpitations and orthopnea.  Gastrointestinal: Negative for abdominal pain, constipation, diarrhea, nausea and vomiting.  Genitourinary: Negative for dysuria, frequency and urgency.  Musculoskeletal: Negative for back pain, joint pain and myalgias.  Skin: Negative for itching and rash.  Neurological: Positive for sensory change (decreased sense of smell x6-7 months). Negative for dizziness, weakness and headaches.  Psychiatric/Behavioral: Negative for depression. The patient is not nervous/anxious.      Physical Exam:  Vitals:   12/22/17 1437  BP: 119/70  Pulse: 69  Temp: 98.9 F (37.2 C)  TempSrc: Oral  SpO2: 99%  Weight: (!) 344 lb 14.4 oz (156.4 kg)   Physical Exam  Constitutional: He is oriented to person, place, and time and well-developed, well-nourished, and in no distress.  HENT:  Head: Normocephalic and atraumatic.  Right and left external normal.  Oropharynx erythematous.  Erythematous nasal mucosa with some enlargement.  Eyes: Pupils are equal, round, and reactive to light. Conjunctivae and EOM are normal.  Neck: Normal range of motion. Neck supple. No thyromegaly present.  Cardiovascular: Exam reveals no gallop and no friction rub.  No murmur heard. Irregular rate rhythm, no extra  heart sounds  Pulmonary/Chest: Effort normal and breath sounds normal. No respiratory distress. He has no wheezes.  Abdominal: Soft. Bowel sounds are normal. He exhibits no distension.  Musculoskeletal: Normal range of motion. He exhibits edema (Bilateral 1+ lower extremity edema).  Neurological: He is alert and oriented to person, place, and time. Gait normal.  Skin: Skin is warm and dry. He is not diaphoretic. No erythema.    Psychiatric: Mood and affect normal.     Social History   Socioeconomic History  . Marital status: Single    Spouse name: Not on file  . Number of children: Not on file  . Years of education: Not on file  . Highest education level: Not on file  Occupational History  . Occupation: unemployed    Comment: Previous Soil scientist  . Financial resource strain: Not on file  . Food insecurity:    Worry: Not on file    Inability: Not on file  . Transportation needs:    Medical: Not on file    Non-medical: Not on file  Tobacco Use  . Smoking status: Current Every Day Smoker    Packs/day: 0.20    Types: Cigarettes  . Smokeless tobacco: Never Used  . Tobacco comment: down to 1-2 cig/day with NRT  Substance and Sexual Activity  . Alcohol use: Yes    Alcohol/week: 5.0 standard drinks    Types: 5 Standard drinks or equivalent per week    Comment: 2 times a week beer  . Drug use: No  . Sexual activity: Not on file  Lifestyle  . Physical activity:    Days per week: Not on file    Minutes per session: Not on file  . Stress: Not on file  Relationships  . Social connections:    Talks on phone: Not on file    Gets together: Not on file    Attends religious service: Not on file    Active member of club or organization: Not on file    Attends meetings of clubs or organizations: Not on file    Relationship status: Not on file  . Intimate partner violence:    Fear of current or ex partner: Not on file    Emotionally abused: Not on file    Physically abused: Not on file    Forced sexual activity: Not on file  Other Topics Concern  . Not on file  Social History Narrative  . Not on file    Family History  Problem Relation Age of Onset  . Hypertension Mother   . Diabetes Mother   . Pneumonia Father   . Hypertension Maternal Grandfather   . Colon cancer Neg Hx     Assessment & Plan:   See Encounters Tab for problem based charting.  Patient seen with Dr. Rebeca Alert

## 2017-12-22 NOTE — Assessment & Plan Note (Signed)
Assessment: Patient reports that he has been having some shortness of breath with moderate exertion he will get short of breath when he walks up a hill.  He denies any worsening symptoms.  He reports that he has chronic lower extremity edema and states that this is well controlled.  He reports that he sticks to a low-sodium diet and will use Mrs. Dash seasoning.  He has had no issues with urination including straining, difficulty emptying bladder, pain with urination, or similar issues.  He is currently on Lasix 80 mg twice daily and wears compression stockings daily.  On exam he had 1+ bilateral lower extremity edema with no signs of pulmonary congestion.  He does not appear to have an acute exacerbation at this moment.  He does have a follow-up with his cardiologist in December.  Plan: -Continue Lasix 80 mg twice daily, 3 times daily as needed -Continue compression stockings - Continue to follow-up with cardiology

## 2017-12-22 NOTE — Assessment & Plan Note (Signed)
Assessment: Patient reports that for the past 6 to 7 months he has had decreased sense of smell.  He had a wisdom tooth pulled around this time and was told that his decreased sense of smell was related to this.  He reports that he has not had any improvement of his sense of smell.  He states that his food has been tasting differently and appears more bland.  He denies any precipitating factors such as head trauma, infection, or abnormal smells.  He does have issues with his sinuses and has been having sinus congestion. He does have erythematous nares on exam.  This could be related to an allergic rhinitis however he does not seem to improve after being treated for that.    Plan: - We have switched his nasal spray from Flonase still has a lasting and his Claritin to [employer]. -ENT referral

## 2017-12-23 LAB — BMP8+ANION GAP
Anion Gap: 15 mmol/L (ref 10.0–18.0)
BUN/Creatinine Ratio: 20 (ref 9–20)
BUN: 19 mg/dL (ref 6–24)
CO2: 25 mmol/L (ref 20–29)
Calcium: 8.7 mg/dL (ref 8.7–10.2)
Chloride: 98 mmol/L (ref 96–106)
Creatinine, Ser: 0.93 mg/dL (ref 0.76–1.27)
GFR calc Af Amer: 106 mL/min/{1.73_m2} (ref >59)
GFR calc non Af Amer: 91 mL/min/{1.73_m2} (ref >59)
Glucose: 74 mg/dL (ref 65–99)
Potassium: 4.2 mmol/L (ref 3.5–5.2)
Sodium: 138 mmol/L (ref 134–144)

## 2017-12-26 NOTE — Progress Notes (Signed)
Internal Medicine Clinic Attending  I saw and evaluated the patient.  I personally confirmed the key portions of the history and exam documented by Dr. Krienke and I reviewed pertinent patient test results.  The assessment, diagnosis, and plan were formulated together and I agree with the documentation in the resident's note.  Alexander Raines, M.D., Ph.D.  

## 2018-01-05 DIAGNOSIS — J324 Chronic pansinusitis: Secondary | ICD-10-CM | POA: Diagnosis not present

## 2018-01-05 DIAGNOSIS — R0981 Nasal congestion: Secondary | ICD-10-CM | POA: Diagnosis not present

## 2018-01-22 ENCOUNTER — Inpatient Hospital Stay (HOSPITAL_COMMUNITY)
Admission: EM | Admit: 2018-01-22 | Discharge: 2018-02-02 | DRG: 291 | Disposition: A | Discharge status: Home / Self Care | Payer: Medicaid Other | Attending: Internal Medicine | Admitting: Internal Medicine

## 2018-01-22 ENCOUNTER — Other Ambulatory Visit: Payer: Self-pay

## 2018-01-22 ENCOUNTER — Encounter (HOSPITAL_COMMUNITY): Payer: Self-pay

## 2018-01-22 ENCOUNTER — Emergency Department (HOSPITAL_COMMUNITY): Payer: Medicaid Other

## 2018-01-22 DIAGNOSIS — Z6841 Body Mass Index (BMI) 40.0 and over, adult: Secondary | ICD-10-CM

## 2018-01-22 DIAGNOSIS — I4891 Unspecified atrial fibrillation: Secondary | ICD-10-CM | POA: Diagnosis present

## 2018-01-22 DIAGNOSIS — Z7901 Long term (current) use of anticoagulants: Secondary | ICD-10-CM

## 2018-01-22 DIAGNOSIS — R0602 Shortness of breath: Secondary | ICD-10-CM | POA: Diagnosis not present

## 2018-01-22 DIAGNOSIS — Z716 Tobacco abuse counseling: Secondary | ICD-10-CM

## 2018-01-22 DIAGNOSIS — B182 Chronic viral hepatitis C: Secondary | ICD-10-CM | POA: Diagnosis present

## 2018-01-22 DIAGNOSIS — I5033 Acute on chronic diastolic (congestive) heart failure: Secondary | ICD-10-CM | POA: Diagnosis not present

## 2018-01-22 DIAGNOSIS — J159 Unspecified bacterial pneumonia: Secondary | ICD-10-CM | POA: Diagnosis not present

## 2018-01-22 DIAGNOSIS — I110 Hypertensive heart disease with heart failure: Principal | ICD-10-CM | POA: Diagnosis present

## 2018-01-22 DIAGNOSIS — J189 Pneumonia, unspecified organism: Secondary | ICD-10-CM | POA: Diagnosis present

## 2018-01-22 DIAGNOSIS — I5043 Acute on chronic combined systolic (congestive) and diastolic (congestive) heart failure: Secondary | ICD-10-CM | POA: Diagnosis present

## 2018-01-22 DIAGNOSIS — R600 Localized edema: Secondary | ICD-10-CM | POA: Diagnosis present

## 2018-01-22 DIAGNOSIS — E662 Morbid (severe) obesity with alveolar hypoventilation: Secondary | ICD-10-CM | POA: Diagnosis not present

## 2018-01-22 DIAGNOSIS — J181 Lobar pneumonia, unspecified organism: Secondary | ICD-10-CM | POA: Diagnosis not present

## 2018-01-22 DIAGNOSIS — R609 Edema, unspecified: Secondary | ICD-10-CM | POA: Diagnosis not present

## 2018-01-22 DIAGNOSIS — Z713 Dietary counseling and surveillance: Secondary | ICD-10-CM

## 2018-01-22 DIAGNOSIS — Z8701 Personal history of pneumonia (recurrent): Secondary | ICD-10-CM | POA: Diagnosis not present

## 2018-01-22 DIAGNOSIS — B9789 Other viral agents as the cause of diseases classified elsewhere: Secondary | ICD-10-CM | POA: Diagnosis not present

## 2018-01-22 DIAGNOSIS — Z79899 Other long term (current) drug therapy: Secondary | ICD-10-CM

## 2018-01-22 DIAGNOSIS — Z91011 Allergy to milk products: Secondary | ICD-10-CM | POA: Diagnosis not present

## 2018-01-22 DIAGNOSIS — Z9119 Patient's noncompliance with other medical treatment and regimen: Secondary | ICD-10-CM

## 2018-01-22 DIAGNOSIS — R05 Cough: Secondary | ICD-10-CM | POA: Diagnosis not present

## 2018-01-22 DIAGNOSIS — J90 Pleural effusion, not elsewhere classified: Secondary | ICD-10-CM | POA: Diagnosis not present

## 2018-01-22 DIAGNOSIS — J9601 Acute respiratory failure with hypoxia: Secondary | ICD-10-CM | POA: Diagnosis not present

## 2018-01-22 DIAGNOSIS — I4819 Other persistent atrial fibrillation: Secondary | ICD-10-CM | POA: Diagnosis not present

## 2018-01-22 DIAGNOSIS — I50814 Right heart failure due to left heart failure: Secondary | ICD-10-CM | POA: Diagnosis present

## 2018-01-22 DIAGNOSIS — Z9114 Patient's other noncompliance with medication regimen: Secondary | ICD-10-CM | POA: Diagnosis not present

## 2018-01-22 DIAGNOSIS — I361 Nonrheumatic tricuspid (valve) insufficiency: Secondary | ICD-10-CM | POA: Diagnosis not present

## 2018-01-22 DIAGNOSIS — I10 Essential (primary) hypertension: Secondary | ICD-10-CM | POA: Diagnosis present

## 2018-01-22 DIAGNOSIS — Z9111 Patient's noncompliance with dietary regimen: Secondary | ICD-10-CM | POA: Diagnosis not present

## 2018-01-22 DIAGNOSIS — I482 Chronic atrial fibrillation, unspecified: Secondary | ICD-10-CM | POA: Diagnosis present

## 2018-01-22 DIAGNOSIS — I4892 Unspecified atrial flutter: Secondary | ICD-10-CM

## 2018-01-22 DIAGNOSIS — Z8249 Family history of ischemic heart disease and other diseases of the circulatory system: Secondary | ICD-10-CM

## 2018-01-22 DIAGNOSIS — E739 Lactose intolerance, unspecified: Secondary | ICD-10-CM | POA: Diagnosis not present

## 2018-01-22 DIAGNOSIS — G4733 Obstructive sleep apnea (adult) (pediatric): Secondary | ICD-10-CM | POA: Diagnosis present

## 2018-01-22 DIAGNOSIS — R9431 Abnormal electrocardiogram [ECG] [EKG]: Secondary | ICD-10-CM | POA: Diagnosis not present

## 2018-01-22 DIAGNOSIS — R509 Fever, unspecified: Secondary | ICD-10-CM | POA: Diagnosis not present

## 2018-01-22 DIAGNOSIS — R000 Tachycardia, unspecified: Secondary | ICD-10-CM | POA: Diagnosis not present

## 2018-01-22 DIAGNOSIS — Z7951 Long term (current) use of inhaled steroids: Secondary | ICD-10-CM | POA: Diagnosis not present

## 2018-01-22 DIAGNOSIS — F17210 Nicotine dependence, cigarettes, uncomplicated: Secondary | ICD-10-CM | POA: Diagnosis present

## 2018-01-22 DIAGNOSIS — I509 Heart failure, unspecified: Secondary | ICD-10-CM

## 2018-01-22 DIAGNOSIS — Z9989 Dependence on other enabling machines and devices: Secondary | ICD-10-CM | POA: Diagnosis not present

## 2018-01-22 LAB — TROPONIN I: Troponin I: <0.03 ng/mL (ref <0.03)

## 2018-01-22 LAB — BASIC METABOLIC PANEL
Anion gap: 14 (ref 5–15)
BUN: 14 mg/dL (ref 6–20)
CO2: 25 mmol/L (ref 22–32)
Calcium: 9.2 mg/dL (ref 8.9–10.3)
Chloride: 92 mmol/L — ABNORMAL LOW (ref 98–111)
Creatinine, Ser: 0.83 mg/dL (ref 0.61–1.24)
GFR calc Af Amer: >60 mL/min (ref >60)
GFR calc non Af Amer: >60 mL/min (ref >60)
Glucose, Bld: 98 mg/dL (ref 70–99)
Potassium: 4.2 mmol/L (ref 3.5–5.1)
Sodium: 131 mmol/L — ABNORMAL LOW (ref 135–145)

## 2018-01-22 LAB — CBC WITH DIFFERENTIAL/PLATELET
Abs Immature Granulocytes: 0.03 10*3/uL (ref 0.00–0.07)
Basophils Absolute: 0 10*3/uL (ref 0.0–0.1)
Basophils Relative: 0 %
Eosinophils Absolute: 0.1 10*3/uL (ref 0.0–0.5)
Eosinophils Relative: 1 %
HCT: 42.5 % (ref 39.0–52.0)
Hemoglobin: 13.4 g/dL (ref 13.0–17.0)
Immature Granulocytes: 0 %
Lymphocytes Relative: 31 %
Lymphs Abs: 2.3 10*3/uL (ref 0.7–4.0)
MCH: 30.7 pg (ref 26.0–34.0)
MCHC: 31.5 g/dL (ref 30.0–36.0)
MCV: 97.3 fL (ref 80.0–100.0)
Monocytes Absolute: 1.2 10*3/uL — ABNORMAL HIGH (ref 0.1–1.0)
Monocytes Relative: 16 %
Neutro Abs: 3.8 10*3/uL (ref 1.7–7.7)
Neutrophils Relative %: 52 %
Platelets: 196 10*3/uL (ref 150–400)
RBC: 4.37 MIL/uL (ref 4.22–5.81)
RDW: 14.9 % (ref 11.5–15.5)
WBC: 7.4 10*3/uL (ref 4.0–10.5)
nRBC: 0.3 % — ABNORMAL HIGH (ref 0.0–0.2)

## 2018-01-22 LAB — BRAIN NATRIURETIC PEPTIDE: B Natriuretic Peptide: 510.4 pg/mL — ABNORMAL HIGH (ref 0.0–100.0)

## 2018-01-22 MED ORDER — SODIUM CHLORIDE 0.9% FLUSH
3.0000 mL | Freq: Two times a day (BID) | INTRAVENOUS | Status: DC
  Administered 2018-01-23 – 2018-02-01 (×17): 3 mL via INTRAVENOUS

## 2018-01-22 MED ORDER — RIVAROXABAN 20 MG PO TABS
20.0000 mg | ORAL_TABLET | Freq: Every day | ORAL | Status: DC
  Administered 2018-01-23 – 2018-02-01 (×11): 20 mg via ORAL
  Filled 2018-01-22 (×11): qty 1

## 2018-01-22 MED ORDER — FUROSEMIDE 10 MG/ML IJ SOLN
40.0000 mg | Freq: Once | INTRAMUSCULAR | Status: AC
  Administered 2018-01-22: 40 mg via INTRAVENOUS
  Filled 2018-01-22: qty 4

## 2018-01-22 MED ORDER — POTASSIUM CHLORIDE CRYS ER 10 MEQ PO TBCR
30.0000 meq | EXTENDED_RELEASE_TABLET | Freq: Two times a day (BID) | ORAL | Status: DC
  Administered 2018-01-23 – 2018-02-02 (×21): 30 meq via ORAL
  Filled 2018-01-22 (×21): qty 3

## 2018-01-22 MED ORDER — SODIUM CHLORIDE 0.9 % IV SOLN
250.0000 mL | INTRAVENOUS | Status: DC | PRN
  Administered 2018-01-28: 250 mL via INTRAVENOUS

## 2018-01-22 MED ORDER — NITROGLYCERIN IN D5W 200-5 MCG/ML-% IV SOLN
0.0000 ug/min | Freq: Once | INTRAVENOUS | Status: AC
  Administered 2018-01-22: 10 ug/min via INTRAVENOUS
  Filled 2018-01-22: qty 250

## 2018-01-22 MED ORDER — SODIUM CHLORIDE 0.9% FLUSH: 3.0000 mL | INTRAVENOUS | Status: DC | PRN

## 2018-01-22 MED ORDER — DILTIAZEM HCL ER COATED BEADS 180 MG PO CP24
360.0000 mg | ORAL_CAPSULE | Freq: Every day | ORAL | Status: DC
  Administered 2018-01-23 – 2018-01-28 (×6): 360 mg via ORAL
  Filled 2018-01-22 (×6): qty 2

## 2018-01-22 MED ORDER — FLUTICASONE PROPIONATE 50 MCG/ACT NA SUSP
1.0000 | Freq: Every day | NASAL | Status: DC
  Administered 2018-01-23 – 2018-02-02 (×11): 1 via NASAL
  Filled 2018-01-22: qty 16

## 2018-01-22 MED ORDER — LORATADINE 10 MG PO TABS: 10.0000 mg | ORAL_TABLET | Freq: Every day | ORAL | Status: DC

## 2018-01-22 MED ORDER — ONDANSETRON HCL 4 MG/2ML IJ SOLN: 4.0000 mg | Freq: Four times a day (QID) | INTRAMUSCULAR | Status: DC | PRN

## 2018-01-22 MED ORDER — FUROSEMIDE 10 MG/ML IJ SOLN
40.0000 mg | Freq: Two times a day (BID) | INTRAMUSCULAR | Status: DC
  Administered 2018-01-23: 40 mg via INTRAVENOUS
  Filled 2018-01-22: qty 4

## 2018-01-22 MED ORDER — ACETAMINOPHEN 325 MG PO TABS
650.0000 mg | ORAL_TABLET | ORAL | Status: DC | PRN
  Administered 2018-01-25 – 2018-01-28 (×2): 650 mg via ORAL
  Filled 2018-01-22 (×3): qty 2

## 2018-01-22 MED ORDER — LORATADINE 10 MG PO TABS
10.0000 mg | ORAL_TABLET | Freq: Every day | ORAL | Status: DC
  Administered 2018-01-23 – 2018-02-02 (×11): 10 mg via ORAL
  Filled 2018-01-22 (×11): qty 1

## 2018-01-22 NOTE — ED Triage Notes (Signed)
Pt from home to ED via [facility] presents with increased abd swelling x1 week and SOB x1 week worse today.  Pt reports expectorating white thick mucous.  Pt has hx of CHF does not wear home O2

## 2018-01-22 NOTE — Progress Notes (Signed)
RT NOTE:  MD request patient titrate from BIPAP to [place]. Pt placed on 5L [place]. Pt tolerating well. RT will monitor.

## 2018-01-22 NOTE — H&P (Signed)
History and Physical   Frank Morales:[id number] DOB: 1960/12/02 DOA: 01/22/2018  Referring MD/NP/PA: Dr. Ellender Hose  PCP: [name], MD   Outpatient Specialists: Dr. Dorris Carnes, cardiology  Patient coming from: Home  Chief Complaint: Shortness of breath  HPI: Frank Morales is a 57 y.o. male with medical history significant of combined diastolic and systolic heart failure, atrial fibrillation, morbid obesity, medication noncompliance, obstructive sleep apnea, history of hepatitis C and tobacco abuse who is on Lasix but has not taken Lasix in a few days.  Patient came to the ER with significant shortness of breath cough and overall anasarca.  He was hypoxic and initially placed on BiPAP.  Patient has since been titrated down to 5 L of oxygen by nasal cannula.  He has received doses of IV Lasix.  Patient is known to be noncompliant.  He had echocardiogram back in April of this year showing normal EF.  He has been cardioverted before but failed.  Currently on Xarelto.  In the ER he is voided more than 2 L already.  ED Course: Temperature is 98.7 blood pressure currently 147/100 pulse 170 respirate of 29 with oxygen sats 93% on 5 L.  White count 7.4 hemoglobin 13.4 and platelets 196.  Sodium 131 potassium 4.2 chloride 92 CO2 25 with a gap of 14 creatinine 0.83.  Glucose 98.  X-ray showed Cardiomegaly without overt edema. Possible small right effusion with underlying atelectasis.  Patient received IV Lasix and has been admitted for further work-up  Review of Systems: As per HPI otherwise 10 point review of systems negative.    Past Medical History:  Diagnosis Date  . Atrial fibrillation and flutter ([place]) 08/17/2015   A. S/p failed DCCV // b. Severe BAE on echo >> rate control strategy (has seen AF clinic) // c. Xarelto for anticoag (CHADS2-VASc=2 / CHF, HTN)  . Chronic diastolic CHF (congestive heart failure) ([place]) 08/30/2015   A. Echo 6/17: Apical HK, moderate focal basal and  mild concentric LVH, EF 50-55, diffuse HK, trivial MR, severe BAE, mild TR, PASP 37  . Hepatitis C, chronic ([place]) 08/19/2015  . History of cardiac catheterization    a. LHC 6/17: LAD irregs, o/w no CAD  . OSA (obstructive sleep apnea) 11/21/2015  . Sleep apnea   . Tobacco abuse     Past Surgical History:  Procedure Laterality Date  . CARDIAC CATHETERIZATION N/A 08/21/2015   Procedure: Right/Left Heart Cath and Coronary Angiography;  Surgeon: Leonie Man, MD;  Location: [facility];  Service: Cardiovascular;  Laterality: N/A;  . CARDIOVERSION N/A 09/22/2015   Procedure: CARDIOVERSION;  Surgeon: Josue Hector, MD;  Location: [facility] ENDOSCOPY;  Service: Cardiovascular;  Laterality: N/A;  . COLONOSCOPY N/A 07/19/2016   Procedure: COLONOSCOPY;  Surgeon: Doran Stabler, MD;  Location: [facility] ENDOSCOPY;  Service: Gastroenterology;  Laterality: N/A;  . ESOPHAGOGASTRODUODENOSCOPY N/A 07/19/2016   Procedure: ESOPHAGOGASTRODUODENOSCOPY (EGD);  Surgeon: Doran Stabler, MD;  Location: [facility] ENDOSCOPY;  Service: Gastroenterology;  Laterality: N/A;  . NO PAST SURGERIES       reports that he has been smoking cigarettes. He has been smoking about 0.20 packs per day. He has never used smokeless tobacco. He reports that he drinks about 5.0 standard drinks of alcohol per week. He reports that he does not use drugs.  Allergies  Allergen Reactions  . Lactose Intolerance (Gi) Nausea And Vomiting    Family History  Problem Relation Age of Onset  .  Hypertension Mother   . Diabetes Mother   . Pneumonia Father   . Hypertension Maternal Grandfather   . Colon cancer Neg Hx      Prior to Admission medications   Medication Sig Start Date End Date Taking? Authorizing Provider  amoxicillin-clavulanate (AUGMENTIN) 875-125 MG tablet Take 1 tablet by mouth 2 (two) times daily. 01/05/18  Yes [provider]  diltiazem (CARDIZEM CD) 360 MG 24 hr capsule Take 1 capsule (360 mg total) by mouth daily. 12/22/17   Yes [name], MD  fexofenadine [facility] ALLERGY) 60 MG tablet Take 1 tablet (60 mg total) by mouth 2 (two) times daily. 12/22/17  Yes [name], MD  fluticasone (FLONASE) 50 MCG/ACT nasal spray Place 1 spray into both nostrils daily. 04/10/17  Yes NedrudLarena Glassman, MD  furosemide (LASIX) 80 MG tablet Take one  80 mg tablet by mouth   twice daily. 12/22/17  Yes [name], MD  loratadine (CLARITIN) 10 MG tablet Take 1 tablet (10 mg total) by mouth daily. 09/12/17  Yes Fay Records, MD  potassium chloride (K-DUR) 10 MEQ tablet Take 3 (10 mEq) tablets by mouth 2 times a day 11/05/17  Yes Evans Lance, MD  rivaroxaban (XARELTO) 20 MG TABS tablet Take 1 tablet (20 mg total) by mouth daily with supper. 12/22/17  Yes [name], MD  azelastine (ASTELIN) 0.1 % nasal spray Place 2 sprays into both nostrils 2 (two) times daily. Use in each nostril as directed up to twice a day. Patient not taking: Reported on 01/22/2018 12/22/17   [name], MD  doxycycline (VIBRAMYCIN) 100 MG capsule Take 1 capsule (100 mg total) by mouth 2 (two) times daily. Patient not taking: Reported on 01/22/2018 10/23/17   [place], [place], PA-C  guaiFENesin (MUCINEX) 600 MG 12 hr tablet Take 1 tablet (600 mg total) by mouth 2 (two) times daily. Patient not taking: Reported on 01/22/2018 12/22/17   [name], MD  nicotine (NICODERM CQ - DOSED IN MG/24 HOURS) 14 mg/24hr patch Place 1 patch (14 mg total) onto the skin daily. Patient not taking: Reported on 01/22/2018 07/10/17   Hosie Poisson, MD    Physical Exam: Vitals:   01/22/18 1930 01/22/18 1945 01/22/18 2027 01/22/18 2145  BP: 104/68 103/74 (!) 147/100 116/71  Pulse: (!) 53 (!) 48 (!) 113 (!) 36  Resp: 13 16 20    SpO2: 94% 94% 95% 96%  Weight:      Height:          Constitutional: Morbidly obese, generalized anasarca NAD, calm, comfortable Vitals:   01/22/18 1930 01/22/18 1945 01/22/18 2027 01/22/18 2145  BP: 104/68 103/74  (!) 147/100 116/71  Pulse: (!) 53 (!) 48 (!) 113 (!) 36  Resp: 13 16 20    SpO2: 94% 94% 95% 96%  Weight:      Height:       Eyes: PERRL, lids and conjunctivae normal ENMT: Mucous membranes are moist. Posterior pharynx clear of any exudate or lesions.Normal dentition.  Neck: normal, supple, no masses, no thyromegaly Respiratory: Decreased air entry bilaterally, no wheezing, but basal crackles. Normal respiratory effort. No accessory muscle use.  Cardiovascular: Regular rate and rhythm, no murmurs / rubs / gallops. 2+ extremity edema. 2+ pedal pulses. No carotid bruits.  Abdomen: no tenderness, no masses palpated. No hepatosplenomegaly. Bowel sounds positive.  Musculoskeletal: no clubbing / cyanosis. No joint deformity upper and lower extremities. Good ROM, no contractures. Normal muscle tone.  Skin: no rashes,  lesions, ulcers. No induration Neurologic: CN 2-12 grossly intact. Sensation intact, DTR normal. Strength 5/5 in all 4.  Psychiatric: Normal judgment and insight. Alert and oriented x 3. Normal mood.     Labs on Admission: I have personally reviewed following labs and imaging studies  CBC: Recent Labs  Lab 01/22/18 1815  WBC 7.4  NEUTROABS 3.8  HGB 13.4  HCT 42.5  MCV 97.3  PLT 063   Basic Metabolic Panel: Recent Labs  Lab 01/22/18 1815  NA 131*  K 4.2  CL 92*  CO2 25  GLUCOSE 98  BUN 14  CREATININE 0.83  CALCIUM 9.2   GFR: Estimated Creatinine Clearance: 159.8 mL/min (by C-G formula based on SCr of 0.83 mg/dL). Liver Function Tests: No results for input(s): AST, ALT, ALKPHOS, BILITOT, PROT, ALBUMIN in the last 168 hours. No results for input(s): LIPASE, AMYLASE in the last 168 hours. No results for input(s): AMMONIA in the last 168 hours. Coagulation Profile: No results for input(s): INR, PROTIME in the last 168 hours. Cardiac Enzymes: Recent Labs  Lab 01/22/18 1815  TROPONINI <0.03   BNP (last 3 results) Recent Labs    09/12/17 1655  PROBNP 382*     HbA1C: No results for input(s): HGBA1C in the last 72 hours. CBG: No results for input(s): GLUCAP in the last 168 hours. Lipid Profile: No results for input(s): CHOL, HDL, LDLCALC, TRIG, CHOLHDL, LDLDIRECT in the last 72 hours. Thyroid Function Tests: No results for input(s): TSH, T4TOTAL, FREET4, T3FREE, THYROIDAB in the last 72 hours. Anemia Panel: No results for input(s): VITAMINB12, FOLATE, FERRITIN, TIBC, IRON, RETICCTPCT in the last 72 hours. Urine analysis: No results found for: COLORURINE, APPEARANCEUR, LABSPEC, PHURINE, GLUCOSEU, HGBUR, BILIRUBINUR, KETONESUR, PROTEINUR, UROBILINOGEN, NITRITE, LEUKOCYTESUR Sepsis Labs: @LABRCNTIP (procalcitonin:4,lacticidven:4) )No results found for this or any previous visit (from the past 240 hour(s)).   Radiological Exams on Admission: Dg Chest Port 1 View  Result Date: 01/22/2018 CLINICAL DATA:  Abdominal swelling.  Shortness of breath. EXAM: PORTABLE CHEST 1 VIEW COMPARISON:  July 03, 2017 FINDINGS: Stable cardiomegaly. The hila and mediastinum are unchanged. No pulmonary nodules or masses. Possible small right effusion with underlying atelectasis. No other acute abnormalities. IMPRESSION: Cardiomegaly without overt edema. Possible small right effusion with underlying atelectasis. Electronically Signed   By: Dorise Bullion III M.D   On: 01/22/2018 19:02    EKG: Independently reviewed.  It shows atrial fibrillation with a rate of 120 with a rightward axis.  Assessment/Plan Principal Problem:   Acute on chronic combined systolic and diastolic congestive heart failure (HCC) Active Problems:   Morbid obesity (HCC)   Hypertension   Bilateral leg edema   Atrial fibrillation and flutter (HCC)   OSA (obstructive sleep apnea)   CHF (congestive heart failure) ([place])     #1 acute on chronic CHF exacerbation: Patient has combined diastolic and systolic dysfunction.  He has missed his Lasix and has fluid overload.  We will start IV Lasix 40  g twice daily.  Strict INO.  Continue other cardiac medications.  He has had echo in April of this year.  We will not repeat echo and will refer him back to cardiologist.  #2 morbid obesity: Dietary counseling.  #3 acute hypoxemia: Titrate oxygen off as we diuresis patient  #4 obstructive sleep apnea: On CPAP at night.  We will continue  #5 hypertension: Blood pressure appears controlled.  Continue current regimen.  DVT prophylaxis: Xarelto Code Status: Full code Family Communication: No family available Disposition Plan: Home  Consults called: None Admission status: Inpatient  Severity of Illness: The appropriate patient status for this patient is INPATIENT. Inpatient status is judged to be reasonable and necessary in order to provide the required intensity of service to ensure the patient's safety. The patient's presenting symptoms, physical exam findings, and initial radiographic and laboratory data in the context of their chronic comorbidities is felt to place them at high risk for further clinical deterioration. Furthermore, it is not anticipated that the patient will be medically stable for discharge from the hospital within 2 midnights of admission. The following factors support the patient status of inpatient.   " The patient's presenting symptoms include shortness of breath and hypoxia. " The worrisome physical exam findings include anasarca with hypoxemia. " The initial radiographic and laboratory data are worrisome because of evidence of fluid overload. " The chronic co-morbidities include congestive heart failure.   * I certify that at the point of admission it is my clinical judgment that the patient will require inpatient hospital care spanning beyond 2 midnights from the point of admission due to high intensity of service, high risk for further deterioration and high frequency of surveillance required.Barbette Merino MD [facility] Pager [phone]  If  7PM-7AM, please contact night-coverage [email or web address] Password [email or web address]  01/22/2018, 10:14 PM

## 2018-01-22 NOTE — ED Notes (Signed)
Spoke with MD Jonelle Sidle who states to D/C nitro drip

## 2018-01-22 NOTE — ED Notes (Signed)
Pt placed on 5L [place] by RT. Pt tolerating well, sats 95%, will continue to monitor.

## 2018-01-23 ENCOUNTER — Inpatient Hospital Stay (HOSPITAL_COMMUNITY): Payer: Medicaid Other

## 2018-01-23 DIAGNOSIS — Z7901 Long term (current) use of anticoagulants: Secondary | ICD-10-CM

## 2018-01-23 DIAGNOSIS — G4733 Obstructive sleep apnea (adult) (pediatric): Secondary | ICD-10-CM

## 2018-01-23 DIAGNOSIS — Z9111 Patient's noncompliance with dietary regimen: Secondary | ICD-10-CM

## 2018-01-23 DIAGNOSIS — Z9114 Patient's other noncompliance with medication regimen: Secondary | ICD-10-CM

## 2018-01-23 DIAGNOSIS — J9601 Acute respiratory failure with hypoxia: Secondary | ICD-10-CM

## 2018-01-23 DIAGNOSIS — I361 Nonrheumatic tricuspid (valve) insufficiency: Secondary | ICD-10-CM

## 2018-01-23 DIAGNOSIS — Z79899 Other long term (current) drug therapy: Secondary | ICD-10-CM

## 2018-01-23 DIAGNOSIS — I4891 Unspecified atrial fibrillation: Secondary | ICD-10-CM

## 2018-01-23 LAB — COMPREHENSIVE METABOLIC PANEL WITH GFR
ALT: 15 U/L (ref 0–44)
AST: 20 U/L (ref 15–41)
Albumin: 3.2 g/dL — ABNORMAL LOW (ref 3.5–5.0)
Alkaline Phosphatase: 43 U/L (ref 38–126)
Anion gap: 9 (ref 5–15)
BUN: 11 mg/dL (ref 6–20)
CO2: 33 mmol/L — ABNORMAL HIGH (ref 22–32)
Calcium: 8.9 mg/dL (ref 8.9–10.3)
Chloride: 93 mmol/L — ABNORMAL LOW (ref 98–111)
Creatinine, Ser: 0.85 mg/dL (ref 0.61–1.24)
GFR calc Af Amer: >60 mL/min
GFR calc non Af Amer: >60 mL/min
Glucose, Bld: 118 mg/dL — ABNORMAL HIGH (ref 70–99)
Potassium: 4 mmol/L (ref 3.5–5.1)
Sodium: 135 mmol/L (ref 135–145)
Total Bilirubin: 0.7 mg/dL (ref 0.3–1.2)
Total Protein: 7.2 g/dL (ref 6.5–8.1)

## 2018-01-23 LAB — CBC WITH DIFFERENTIAL/PLATELET
ABS IMMATURE GRANULOCYTES: 0.02 10*3/uL (ref 0.00–0.07)
Basophils Absolute: 0 10*3/uL (ref 0.0–0.1)
Basophils Relative: 1 %
EOS ABS: 0.1 10*3/uL (ref 0.0–0.5)
EOS PCT: 1 %
HEMATOCRIT: 43.2 % (ref 39.0–52.0)
Hemoglobin: 13.4 g/dL (ref 13.0–17.0)
IMMATURE GRANULOCYTES: 0 %
LYMPHS ABS: 1.8 10*3/uL (ref 0.7–4.0)
LYMPHS PCT: 28 %
MCH: 30.2 pg (ref 26.0–34.0)
MCHC: 31 g/dL (ref 30.0–36.0)
MCV: 97.5 fL (ref 80.0–100.0)
Monocytes Absolute: 0.8 10*3/uL (ref 0.1–1.0)
Monocytes Relative: 13 %
NEUTROS PCT: 57 %
NRBC: 0.5 % — AB (ref 0.0–0.2)
Neutro Abs: 3.7 10*3/uL (ref 1.7–7.7)
Platelets: 209 10*3/uL (ref 150–400)
RBC: 4.43 MIL/uL (ref 4.22–5.81)
RDW: 15.2 % (ref 11.5–15.5)
WBC: 6.3 10*3/uL (ref 4.0–10.5)

## 2018-01-23 LAB — ECHOCARDIOGRAM COMPLETE
Height: 71 in
Weight: 5806.4 oz

## 2018-01-23 LAB — TSH: TSH: 4.238 u[IU]/mL (ref 0.350–4.500)

## 2018-01-23 LAB — HIV ANTIBODY (ROUTINE TESTING W REFLEX): HIV Screen 4th Generation wRfx: NONREACTIVE

## 2018-01-23 MED ORDER — FUROSEMIDE 10 MG/ML IJ SOLN
40.0000 mg | Freq: Three times a day (TID) | INTRAMUSCULAR | Status: DC
  Administered 2018-01-23 – 2018-01-25 (×5): 40 mg via INTRAVENOUS
  Filled 2018-01-23 (×5): qty 4

## 2018-01-23 MED ORDER — GUAIFENESIN-DM 100-10 MG/5ML PO SYRP
5.0000 mL | ORAL_SOLUTION | ORAL | Status: DC | PRN
  Administered 2018-01-23 – 2018-01-26 (×9): 5 mL via ORAL
  Filled 2018-01-23 (×9): qty 5

## 2018-01-23 NOTE — Progress Notes (Signed)
RT came to ask patient about CPAP HS. Patient states he does not wear one at home and doesn't want one tonight.

## 2018-01-23 NOTE — Progress Notes (Signed)
   Subjective: Frank Morales is a 57 y.o male with HFpEF who was admitted on 01/22/2018 with signs and symptoms of volume overload secondary to an acute heart failure exacerbation in the setting of dietary and medication nonadherence. He was initially on Bipap and started on IV furosemide before transitioning to 5L/min [place].   This AM he is doing much better but still having some SOB and orthopnea. His ideal weight is 340lbs and he is currently >370lbs. At his ideal weight he is able to perform all his ADLs and work in his yard without dyspnea. He denies CP. States he has been taking furosemide 40 mg BID but a couple weeks ago had a get together that put him behind. Since then it has been a snowball affect with continued LE swelling, abdominal distention, SOB, and orthopnea. We discussed that given his extreme volume overload and new oxygen requirement he will likley need 3-4 days of IV diuretics before he is stable for discharge. All questions and concerns addressed.   Objective: Vital signs in last 24 hours: Vitals:   01/23/18 0502 01/23/18 0821 01/23/18 1019 01/23/18 1142  BP: (!) 156/113  110/68 111/83  Pulse: (!) 173  92 95  Resp: 20  20 16   Temp: 98.3 F (36.8 C)  98.5 F (36.9 C) 98.7 F (37.1 C)  TempSrc: Oral  Oral Oral  SpO2: 95%  92% 100%  Weight:  (!) 164.6 kg    Height:       General: Obese male in no acute distress Pulm: Distant breath sounds with diminished breath sounds bilaterally, no wheezing CV: Irregularly irregular rhythm but normal rate, no murmurs, no rubs  Abdomen: Firm, distended, pitting edema, no tenderness to palpation  Extremities: Moderate pitting edema bilaterally  Skin: Hyperpigmentation with lichenification of the bilateral LEs.  Assessment/Plan:  Frank Morales is a 57 y.o male with HFpEF who was admitted on 01/22/2018 with signs and symptoms of volume overload secondary to an acute heart failure exacerbation in the setting of dietary and medication  nonadherence.   Acute on Chronic HFpEF Acute Hypoxic Respiratory Failure  - Likely secondary to dietary and medication nonadherence - Will obtain repeat echo to evaluate for structural changes  - Telemetry reviewed, rate controlled atrial fibrillation  - Renal function and potassium stable, check mg  - 1.5L out over the past 24 hours - Increase furosemide to 40 mg TID - Wean oxygen as tolerated, maintain oxygen sat >88% - Continue to monitor renal function and electrolytes   Atrial Fibrillation  - Telemetry reviewed, HR goal <110  - Rate control with diltiazem 360 mg QD  - Anticoagulation with Xarelto   OSA  - CPAP QHS  Dispo: Anticipated discharge in approximately 3-4 day(s) after adequate diuresis. Patient with new oxygen requirement and high risk for further respiratory and cardiac decline without adequate diurese that cannot be achieved with PO diuretics.   Frank Homes, MD 01/23/2018, 12:21 PM

## 2018-01-23 NOTE — Discharge Instructions (Signed)

## 2018-01-23 NOTE — Progress Notes (Signed)
  Echocardiogram 2D Echocardiogram has been performed.  Frank Morales 01/23/2018, 2:09 PM

## 2018-01-23 NOTE — ED Provider Notes (Signed)
[facility] CHF Provider Note   CSN: [id number] Arrival date & time: 01/22/18  1811     History   Chief Complaint Chief Complaint  Patient presents with  . Shortness of Breath    HPI Frank Morales is a 57 y.o. male.  HPI Patient presents to the emergency department with increasing shortness of breath that started over the last 2 days.  The patient was CHF and has been taking his Lasix as prescribed.  Patient came in by EMS on CPAP.  Patient has increasing swelling to the lower extremities along with the abdomen.  The patient denies chest pain, headache,blurred vision, neck pain, fever, cough, weakness, numbness, dizziness, anorexia, abdominal pain, nausea, vomiting, diarrhea, rash, back pain, dysuria, hematemesis, bloody stool, near syncope, or syncope. Past Medical History:  Diagnosis Date  . Atrial fibrillation and flutter ([place]) 08/17/2015   A. S/p failed DCCV // b. Severe BAE on echo >> rate control strategy (has seen AF clinic) // c. Xarelto for anticoag (CHADS2-VASc=2 / CHF, HTN)  . Chronic diastolic CHF (congestive heart failure) ([place]) 08/30/2015   A. Echo 6/17: Apical HK, moderate focal basal and mild concentric LVH, EF 50-55, diffuse HK, trivial MR, severe BAE, mild TR, PASP 37  . Hepatitis C, chronic ([place]) 08/19/2015  . History of cardiac catheterization    a. LHC 6/17: LAD irregs, o/w no CAD  . OSA (obstructive sleep apnea) 11/21/2015  . Sleep apnea   . Tobacco abuse     Patient Active Problem List   Diagnosis Date Noted  . CHF (congestive heart failure) ([place]) 01/22/2018  . Hyposmia 12/22/2017  . Hypokalemia   . Tobacco abuse counseling   . Chronic atrial fibrillation   . Acute on chronic combined systolic and diastolic congestive heart failure ([place]) 07/03/2017  . Sinus congestion 03/03/2017  . Chronic dental pain 08/19/2016  . Rectal bleeding   . Grade I internal hemorrhoids   . 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 Contact dermatitis due to  poison ivy 02/26/2016  . OSA (obstructive sleep apnea) 11/21/2015  . Acute on chronic diastolic CHF (congestive heart failure) ([place]) 08/30/2015  . Hepatitis C, chronic ([place]) 08/19/2015  . Morbid obesity ([place]) 08/17/2015  . Hypertension 08/17/2015  . SOB (shortness of breath) 08/17/2015  . Bilateral leg edema 08/17/2015  . Atrial fibrillation and flutter ([place]) 08/17/2015  . Current smoker 08/17/2015    Past Surgical History:  Procedure Laterality Date  . CARDIAC CATHETERIZATION N/A 08/21/2015   Procedure: Right/Left Heart Cath and Coronary Angiography;  Surgeon: Leonie Man, MD;  Location: [facility] LAB;  Service: Cardiovascular;  Laterality: N/A;  . CARDIOVERSION N/A 09/22/2015   Procedure: CARDIOVERSION;  Surgeon: Josue Hector, MD;  Location: [facility] ENDOSCOPY;  Service: Cardiovascular;  Laterality: N/A;  . COLONOSCOPY N/A 07/19/2016   Procedure: COLONOSCOPY;  Surgeon: Doran Stabler, MD;  Location: [facility] ENDOSCOPY;  Service: Gastroenterology;  Laterality: N/A;  . ESOPHAGOGASTRODUODENOSCOPY N/A 07/19/2016   Procedure: ESOPHAGOGASTRODUODENOSCOPY (EGD);  Surgeon: Doran Stabler, MD;  Location: [facility] ENDOSCOPY;  Service: Gastroenterology;  Laterality: N/A;  . NO PAST SURGERIES          Home Medications    Prior to Admission medications   Medication Sig Start Date End Date Taking? Authorizing Provider  amoxicillin-clavulanate (AUGMENTIN) 875-125 MG tablet Take 1 tablet by mouth 2 (two) times daily. 01/05/18  Yes [provider]  diltiazem (CARDIZEM CD) 360 MG 24 hr capsule Take 1 capsule (360  mg total) by mouth daily. 12/22/17  Yes [name], MD  fexofenadine [facility] ALLERGY) 60 MG tablet Take 1 tablet (60 mg total) by mouth 2 (two) times daily. 12/22/17  Yes [name], MD  fluticasone (FLONASE) 50 MCG/ACT nasal spray Place 1 spray into both nostrils daily. 04/10/17  Yes NedrudLarena Glassman, MD  furosemide (LASIX) 80 MG tablet Take one  80 mg tablet by mouth    twice daily. 12/22/17  Yes [name], MD  loratadine (CLARITIN) 10 MG tablet Take 1 tablet (10 mg total) by mouth daily. 09/12/17  Yes Fay Records, MD  potassium chloride (K-DUR) 10 MEQ tablet Take 3 (10 mEq) tablets by mouth 2 times a day 11/05/17  Yes Evans Lance, MD  rivaroxaban (XARELTO) 20 MG TABS tablet Take 1 tablet (20 mg total) by mouth daily with supper. 12/22/17  Yes [name], MD  azelastine (ASTELIN) 0.1 % nasal spray Place 2 sprays into both nostrils 2 (two) times daily. Use in each nostril as directed up to twice a day. Patient not taking: Reported on 01/22/2018 12/22/17   [name], MD  doxycycline (VIBRAMYCIN) 100 MG capsule Take 1 capsule (100 mg total) by mouth 2 (two) times daily. Patient not taking: Reported on 01/22/2018 10/23/17   Wurst, [place], PA-C  guaiFENesin (MUCINEX) 600 MG 12 hr tablet Take 1 tablet (600 mg total) by mouth 2 (two) times daily. Patient not taking: Reported on 01/22/2018 12/22/17   [name], MD  nicotine (NICODERM CQ - DOSED IN MG/24 HOURS) 14 mg/24hr patch Place 1 patch (14 mg total) onto the skin daily. Patient not taking: Reported on 01/22/2018 07/10/17   Hosie Poisson, MD    Family History Family History  Problem Relation Age of Onset  . Hypertension Mother   . Diabetes Mother   . Pneumonia Father   . Hypertension Maternal Grandfather   . Colon cancer Neg Hx     Social History Social History   Tobacco Use  . Smoking status: Current Every Day Smoker    Packs/day: 0.20    Types: Cigarettes  . Smokeless tobacco: Never Used  . Tobacco comment: down to 1-2 cig/day with NRT  Substance Use Topics  . Alcohol use: Yes    Alcohol/week: 5.0 standard drinks    Types: 5 Standard drinks or equivalent per week    Comment: 2 times a week beer  . Drug use: No     Allergies   Lactose intolerance (gi)   Review of Systems Review of Systems  All other systems negative except as documented in the HPI. All  pertinent positives and negatives as reviewed in the HPI. Physical Exam Updated Vital Signs BP (!) 137/95 (BP Location: Right Arm)   Pulse 71   Temp 98.2 F (36.8 C) (Oral)   Resp 20   Ht 5\' 11"  (1.803 m)   Wt (!) 165.2 kg   SpO2 95%   BMI 50.81 kg/m   Physical Exam  Constitutional: He is oriented to person, place, and time. He appears well-developed and well-nourished. No distress.  HENT:  Head: Normocephalic and atraumatic.  Mouth/Throat: Oropharynx is clear and moist.  Eyes: Pupils are equal, round, and reactive to light.  Neck: Normal range of motion. Neck supple.  Cardiovascular: Normal rate, regular rhythm and normal heart sounds. Exam reveals no gallop and no friction rub.  No murmur heard. Pulmonary/Chest: Effort normal. No respiratory distress. He has decreased breath sounds in the right  upper field, the right middle field, the left upper field and the left middle field. He has no wheezes. He has rales in the right lower field and the left lower field.  Abdominal: Soft. Bowel sounds are normal. He exhibits no distension. There is no tenderness.  Musculoskeletal:       Legs: Neurological: He is alert and oriented to person, place, and time. He exhibits normal muscle tone. Coordination normal.  Skin: Skin is warm and dry. Capillary refill takes less than 2 seconds. No rash noted. No erythema.  Psychiatric: He has a normal mood and affect. His behavior is normal.  Nursing note and vitals reviewed.    ED Treatments / Results  Labs (all labs ordered are listed, but only abnormal results are displayed) Labs Reviewed  BASIC METABOLIC PANEL - Abnormal; Notable for the following components:      Result Value   Sodium 131 (*)    Chloride 92 (*)    All other components within normal limits  CBC WITH DIFFERENTIAL/PLATELET - Abnormal; Notable for the following components:   nRBC 0.3 (*)    Monocytes Absolute 1.2 (*)    All other components within normal limits  BRAIN  NATRIURETIC PEPTIDE - Abnormal; Notable for the following components:   B Natriuretic Peptide 510.4 (*)    All other components within normal limits  TROPONIN I  HIV ANTIBODY (ROUTINE TESTING W REFLEX)  CBC WITH DIFFERENTIAL/PLATELET  COMPREHENSIVE METABOLIC PANEL  TSH    EKG None  Radiology Dg Chest Port 1 View  Result Date: 01/22/2018 CLINICAL DATA:  Abdominal swelling.  Shortness of breath. EXAM: PORTABLE CHEST 1 VIEW COMPARISON:  July 03, 2017 FINDINGS: Stable cardiomegaly. The hila and mediastinum are unchanged. No pulmonary nodules or masses. Possible small right effusion with underlying atelectasis. No other acute abnormalities. IMPRESSION: Cardiomegaly without overt edema. Possible small right effusion with underlying atelectasis. Electronically Signed   By: Dorise Bullion III M.D   On: 01/22/2018 19:02    Procedures Procedures (including critical care time)  Medications Ordered in ED Medications  fluticasone (FLONASE) 50 MCG/ACT nasal spray 1 spray (has no administration in time range)  loratadine (CLARITIN) tablet 10 mg (has no administration in time range)  potassium chloride (K-DUR,KLOR-CON) CR tablet 30 mEq (has no administration in time range)  diltiazem (CARDIZEM CD) 24 hr capsule 360 mg (has no administration in time range)  rivaroxaban (XARELTO) tablet 20 mg (20 mg Oral Given 01/23/18 0022)  sodium chloride flush (NS) 0.9 % injection 3 mL (3 mLs Intravenous Given 01/23/18 0020)  sodium chloride flush (NS) 0.9 % injection 3 mL (has no administration in time range)  0.9 %  sodium chloride infusion (has no administration in time range)  acetaminophen (TYLENOL) tablet 650 mg (has no administration in time range)  ondansetron (ZOFRAN) injection 4 mg (has no administration in time range)  furosemide (LASIX) injection 40 mg (has no administration in time range)  furosemide (LASIX) injection 40 mg (40 mg Intravenous Given 01/22/18 1920)  nitroGLYCERIN 50 mg in dextrose 5 %  250 mL (0.2 mg/mL) infusion (0 mcg/min Intravenous Stopped 01/22/18 2224)     Initial Impression / Assessment and Plan / ED Course  I have reviewed the triage vital signs and the nursing notes.  Pertinent labs & imaging results that were available during my care of the patient were reviewed by me and considered in my medical decision making (see chart for details).     CRITICAL CARE Performed by: Resa Miner  Stark Aguinaga Total critical care time: 45 minutes Critical care time was exclusive of separately billable procedures and treating other patients. Critical care was necessary to treat or prevent imminent or life-threatening deterioration. Critical care was time spent personally by me on the following activities: development of treatment plan with patient and/or surrogate as well as nursing, discussions with consultants, evaluation of patient's response to treatment, examination of patient, obtaining history from patient or surrogate, ordering and performing treatments and interventions, ordering and review of laboratory studies, ordering and review of radiographic studies, pulse oximetry and re-evaluation of patient's condition.  Patient initially was on BiPAP.  The emergency department and I did take him off once the nitro drip was started along with the Lasix.  Patient was maintained on 5 L of nasal cannula oxygen without significant issue.  Patient will need to be admitted to the hospital for further evaluation and care.  I feel like this is a CHF weakness to patient.  Final Clinical Impressions(s) / ED Diagnoses   Final diagnoses:  None    ED Discharge Orders    None       Dalia Heading, PA-C 01/23/18 0026    Duffy Bruce, MD 01/24/18 [phone]

## 2018-01-23 NOTE — Care Management Note (Signed)
Case Management Note  Patient Details  Name: Frank Morales MRN: [id number] Date of Birth: [birth year]  Subjective/Objective: CHF                  Action/Plan: Patient lives at home; PCP: [name], MD; has private insurance with Medicaid with prescription drug coverage; CM will continue to follow for progression of care.  Expected Discharge Date:    Possibly 01/28/2018              Expected Discharge Plan:  Home/Self Care  Discharge planning Services  CM Consult  Status of Service:  In process, will continue to follow  Sherrilyn Rist [phone] 01/23/2018, 3:51 PM

## 2018-01-24 LAB — BASIC METABOLIC PANEL
Anion gap: 8 (ref 5–15)
BUN: 11 mg/dL (ref 6–20)
CHLORIDE: 92 mmol/L — AB (ref 98–111)
CO2: 33 mmol/L — ABNORMAL HIGH (ref 22–32)
Calcium: 8.4 mg/dL — ABNORMAL LOW (ref 8.9–10.3)
Creatinine, Ser: 0.89 mg/dL (ref 0.61–1.24)
GFR calc Af Amer: >60 mL/min (ref >60)
GFR calc non Af Amer: >60 mL/min (ref >60)
Glucose, Bld: 121 mg/dL — ABNORMAL HIGH (ref 70–99)
Potassium: 4.4 mmol/L (ref 3.5–5.1)
SODIUM: 133 mmol/L — AB (ref 135–145)

## 2018-01-24 LAB — MAGNESIUM: Magnesium: 2.3 mg/dL (ref 1.7–2.4)

## 2018-01-24 NOTE — Progress Notes (Addendum)
Patient resting comfortably during shift report. Denies complaints.   Per night shift, pt has requested abundance of PO liquids.

## 2018-01-24 NOTE — Progress Notes (Signed)
   Subjective: Patient doing okay this AM. He is still very edematous and feels a little nauseous. He is ambulating and tolerating PO intake. Was able to get some sleep but had to sleep at a 30 degree incline which he states is normal. Discussed the plan for continued aggressive diureses and attempt to get him off the oxygen. All questions and concerns addressed.   Objective: Vital signs in last 24 hours: Vitals:   01/23/18 1142 01/23/18 1955 01/24/18 0502 01/24/18 0759  BP: 111/83 112/82 (!) 134/98 118/76  Pulse: 95 94 (!) 112 (!) 103  Resp: 16 20 20 17   Temp: 98.7 F (37.1 C)  99.7 F (37.6 C) 100.2 F (37.9 C)  TempSrc: Oral  Oral Oral  SpO2: 100% 99% 99% 93%  Weight:   (!) 165.9 kg   Height:       General: Obese male in no acute distress Pulm: Distant breath sounds, diminished bibasilar. No obvious crackles  CV: Irregularly irregular rhythm but normal rate, no murmurs, no rubs  Abdomen: Firm, distended, pitting edema but is improved slightly  Extremities: Moderate pitting LE edema bilaterally   Assessment/Plan:  Frank Morales is a 57 y.o male with HFpEF who was admitted on 01/22/2018 with signs and symptoms of volume overload secondary to an acute heart failure exacerbation in the setting of dietary and medication nonadherence.   Acute on Chronic HFpEF Acute Hypoxic Respiratory Failure  - Likely secondary to dietary and medication nonadherence - Repeat echocardiogram with LVEF 55-60% with LAE, decreased RV systolic function, and RAE - Telemetry reviewed, rate controlled atrial fibrillation  - Renal function, potassium, and mg is stable  - Negative 2.5L since admission, no weight this AM - Continue furosemide to 40 mg TID - Wean oxygen as tolerated, maintain oxygen sat >88% - Continue to monitor renal function and electrolytes   Atrial Fibrillation  - Telemetry reviewed, HR goal <110  - Rate control with diltiazem 360 mg QD  - Anticoagulation with Xarelto   OSA    - CPAP QHS  Dispo: Anticipated discharge in approximately 3-4 day(s) after adequate diuresis. Patient with new oxygen requirement and high risk for further respiratory and cardiac decline without adequate diurese that cannot be achieved with PO diuretics.   [name], MD 01/24/2018, 12:09 PM

## 2018-01-25 ENCOUNTER — Inpatient Hospital Stay (HOSPITAL_COMMUNITY): Payer: Medicaid Other

## 2018-01-25 DIAGNOSIS — I5033 Acute on chronic diastolic (congestive) heart failure: Secondary | ICD-10-CM

## 2018-01-25 DIAGNOSIS — Z9989 Dependence on other enabling machines and devices: Secondary | ICD-10-CM

## 2018-01-25 DIAGNOSIS — J181 Lobar pneumonia, unspecified organism: Secondary | ICD-10-CM

## 2018-01-25 LAB — RESPIRATORY PANEL BY PCR
ADENOVIRUS-RVPPCR: NOT DETECTED
Bordetella pertussis: NOT DETECTED
CORONAVIRUS 229E-RVPPCR: NOT DETECTED
CORONAVIRUS HKU1-RVPPCR: NOT DETECTED
CORONAVIRUS OC43-RVPPCR: NOT DETECTED
Chlamydophila pneumoniae: NOT DETECTED
Coronavirus NL63: NOT DETECTED
INFLUENZA A-RVPPCR: NOT DETECTED
INFLUENZA B-RVPPCR: NOT DETECTED
METAPNEUMOVIRUS-RVPPCR: NOT DETECTED
Mycoplasma pneumoniae: NOT DETECTED
PARAINFLUENZA VIRUS 1-RVPPCR: NOT DETECTED
Parainfluenza Virus 2: NOT DETECTED
Parainfluenza Virus 3: NOT DETECTED
Parainfluenza Virus 4: NOT DETECTED
RHINOVIRUS / ENTEROVIRUS - RVPPCR: DETECTED — AB
Respiratory Syncytial Virus: NOT DETECTED

## 2018-01-25 LAB — BASIC METABOLIC PANEL
ANION GAP: 7 (ref 5–15)
BUN: 9 mg/dL (ref 6–20)
CHLORIDE: 91 mmol/L — AB (ref 98–111)
CO2: 36 mmol/L — ABNORMAL HIGH (ref 22–32)
Calcium: 8.6 mg/dL — ABNORMAL LOW (ref 8.9–10.3)
Creatinine, Ser: 1.08 mg/dL (ref 0.61–1.24)
GFR calc Af Amer: >60 mL/min (ref >60)
GFR calc non Af Amer: >60 mL/min (ref >60)
GLUCOSE: 152 mg/dL — AB (ref 70–99)
POTASSIUM: 4.6 mmol/L (ref 3.5–5.1)
Sodium: 134 mmol/L — ABNORMAL LOW (ref 135–145)

## 2018-01-25 LAB — CBC
HCT: 42.1 % (ref 39.0–52.0)
Hemoglobin: 12.7 g/dL — ABNORMAL LOW (ref 13.0–17.0)
MCH: 30.3 pg (ref 26.0–34.0)
MCHC: 30.2 g/dL (ref 30.0–36.0)
MCV: 100.5 fL — ABNORMAL HIGH (ref 80.0–100.0)
PLATELETS: 188 10*3/uL (ref 150–400)
RBC: 4.19 MIL/uL — ABNORMAL LOW (ref 4.22–5.81)
RDW: 15.1 % (ref 11.5–15.5)
WBC: 8 10*3/uL (ref 4.0–10.5)
nRBC: 0 % (ref 0.0–0.2)

## 2018-01-25 LAB — MAGNESIUM: MAGNESIUM: 2.2 mg/dL (ref 1.7–2.4)

## 2018-01-25 MED ORDER — FUROSEMIDE 10 MG/ML IJ SOLN
80.0000 mg | Freq: Once | INTRAMUSCULAR | Status: AC
  Administered 2018-01-25: 80 mg via INTRAVENOUS
  Filled 2018-01-25: qty 8

## 2018-01-25 MED ORDER — AZITHROMYCIN 500 MG PO TABS
500.0000 mg | ORAL_TABLET | Freq: Every day | ORAL | Status: AC
  Administered 2018-01-25: 500 mg via ORAL
  Filled 2018-01-25: qty 1

## 2018-01-25 MED ORDER — FUROSEMIDE 10 MG/ML IJ SOLN
40.0000 mg | Freq: Once | INTRAMUSCULAR | Status: AC
  Administered 2018-01-25: 40 mg via INTRAVENOUS
  Filled 2018-01-25: qty 4

## 2018-01-25 MED ORDER — AZITHROMYCIN 250 MG PO TABS
250.0000 mg | ORAL_TABLET | Freq: Every day | ORAL | Status: AC
  Administered 2018-01-26 – 2018-01-29 (×4): 250 mg via ORAL
  Filled 2018-01-25 (×4): qty 1

## 2018-01-25 NOTE — Progress Notes (Signed)
   Subjective:  Frank Morales was seen and evaluated at bedside this morning. Not feeling well since spiking a fever early this morning. Still feels quite swollen, particularly in his abdomen. He is having to sit up most of the night due to coughing which is intermittently productive. He has had the cough for at least a week. Endorses good urine output. Denies chest pain, n/v, decreased appetite.   Objective:  Vital signs in last 24 hours: Vitals:   01/24/18 1929 01/25/18 0435 01/25/18 0537 01/25/18 0922  BP: 116/77 115/71  124/70  Pulse: (!) 104 75    Resp: 20 20    Temp: 99.6 F (37.6 C) (!) 100.4 F (38 C) (!) 101 F (38.3 C)   TempSrc: Oral Oral Oral   SpO2: 93% 91%    Weight:  (!) 165.3 kg    Height:       General: awake, alert, obese gentleman sitting up in bed in NAD CV: irregularly irregular rhythm; normal rate; no murmurs, rubs or gallops Pulm: normal respiratory effort; lungs sounds diminished. No obvious crackles or wheezes  Abd: abdomen is firm and distended  Extremities: 2+ pitting edema bilateral lower extremities   Assessment/Plan:  Principal Problem:   Acute on chronic combined systolic and diastolic congestive heart failure (HCC) Active Problems:   Morbid obesity (HCC)   Hypertension   Bilateral leg edema   Atrial fibrillation and flutter (HCC)   OSA (obstructive sleep apnea)   CHF (congestive heart failure) ([place])  Frank Morales is a 56 y.o male with HFpEF who was admitted on 01/22/2018 with signs and symptoms of volume overload secondary to an acute heart failure exacerbation in the setting of dietary and medication nonadherence.   Acute on Chronic HFpEF Acute Hypoxic Respiratory Failure  - Likely secondary to dietary and medication nonadherence - Repeat echocardiogram with LVEF 55-60% with LAE, decreased RV systolic function, and RAE - Negative 2.8 L since admission; weight has not changed much and is still up approximately 20 lbs from reported dry  weight  - will increase diuresis to Lasix 80 mg BID  - crt increased from 0.9>1.1, potassium, and mg stable; continue to trend daily - Still requiring O2 supplementation; Wean oxygen as tolerated, maintain oxygen sat >88%  CAP - patient spiked fever to 101 early this morning; no leukocytosis - repeat CXR shows RLL infiltrate; no lateral to compare with on admission but suspect this was likely present on admission with concurrent volume overload secondary to acute HFpEF exacerbation - will treat with 5 day course of Azithromycin  - this may also be contributing to his new O2 requirement  - will monitor for clinical improvement - Respiratory panel pending   Atrial Fibrillation  - Rate control with diltiazem 360 mg QD  - Anticoagulation with Xarelto   OSA  - CPAP QHS  Dispo: Anticipated discharge in approximately 4-5 day(s).   Modena Nunnery D, DO 01/25/2018, 10:42 AM Pager: [phone]

## 2018-01-25 NOTE — Progress Notes (Signed)
Patient resting comfortably during shift report. Denies complaints.  Respiratory PCR sent to lab.

## 2018-01-25 NOTE — Progress Notes (Signed)
Patient's temperature is 100.4. IMTS has been paged and stated to recheck temp in a hour. Will continue to monitor.

## 2018-01-25 NOTE — Progress Notes (Signed)
Pt leads replaced after FIRST call from [place], however due to patient's coughing and size it did take several minutes to locate wires/replace.   Thank you.

## 2018-01-26 DIAGNOSIS — B9789 Other viral agents as the cause of diseases classified elsewhere: Secondary | ICD-10-CM

## 2018-01-26 DIAGNOSIS — J159 Unspecified bacterial pneumonia: Secondary | ICD-10-CM

## 2018-01-26 LAB — BASIC METABOLIC PANEL
Anion gap: 5 (ref 5–15)
BUN: 7 mg/dL (ref 6–20)
CO2: 38 mmol/L — ABNORMAL HIGH (ref 22–32)
CREATININE: 0.78 mg/dL (ref 0.61–1.24)
Calcium: 8.5 mg/dL — ABNORMAL LOW (ref 8.9–10.3)
Chloride: 91 mmol/L — ABNORMAL LOW (ref 98–111)
GFR calc Af Amer: >60 mL/min (ref >60)
GFR calc non Af Amer: >60 mL/min (ref >60)
GLUCOSE: 137 mg/dL — AB (ref 70–99)
POTASSIUM: 4.1 mmol/L (ref 3.5–5.1)
Sodium: 134 mmol/L — ABNORMAL LOW (ref 135–145)

## 2018-01-26 LAB — MAGNESIUM: Magnesium: 2 mg/dL (ref 1.7–2.4)

## 2018-01-26 MED ORDER — FUROSEMIDE 10 MG/ML IJ SOLN
80.0000 mg | Freq: Four times a day (QID) | INTRAMUSCULAR | Status: AC
  Administered 2018-01-26 (×2): 80 mg via INTRAVENOUS
  Filled 2018-01-26 (×2): qty 8

## 2018-01-26 MED ORDER — IPRATROPIUM-ALBUTEROL 0.5-2.5 (3) MG/3ML IN SOLN
3.0000 mL | Freq: Four times a day (QID) | RESPIRATORY_TRACT | Status: DC
  Administered 2018-01-26 – 2018-01-27 (×4): 3 mL via RESPIRATORY_TRACT
  Filled 2018-01-26 (×4): qty 3

## 2018-01-26 MED ORDER — BENZONATATE 100 MG PO CAPS
100.0000 mg | ORAL_CAPSULE | Freq: Three times a day (TID) | ORAL | Status: DC | PRN
  Administered 2018-01-26 – 2018-01-30 (×6): 100 mg via ORAL
  Filled 2018-01-26 (×7): qty 1

## 2018-01-26 MED ORDER — FUROSEMIDE 10 MG/ML IJ SOLN
80.0000 mg | Freq: Three times a day (TID) | INTRAMUSCULAR | Status: DC
  Administered 2018-01-27 – 2018-02-02 (×19): 80 mg via INTRAVENOUS
  Filled 2018-01-26 (×20): qty 8

## 2018-01-26 NOTE — Progress Notes (Signed)
   Subjective: Frank Morales was seen and evaluated at bedside on morning rounds. He is feeling ok this morning. Still endorses cough. Not much improvement in swelling. Using supplemental oxygen intermittently. Endorses good urine output. No f/c, chest pain.   Objective:  Vital signs in last 24 hours: Vitals:   01/26/18 0347 01/26/18 0913 01/26/18 1239 01/26/18 1336  BP: 110/73  94/60   Pulse: 100  (!) 107   Resp: 20  17   Temp: 99.5 F (37.5 C)  99.1 F (37.3 C)   TempSrc: Oral  Oral   SpO2: 95% 93% 97% 94%  Weight: (!) 164.1 kg     Height:       General: awake, alert, obese male sitting up in bed in NAD Neck: + JVP CV: irregularly irregular rhythm; normal rate Pulm: normal respiratory effort; diminished lung sounds at bases with scattered wheezes  Abd: BS+; abdomen is firm and distended with pitting edema  Extremities: 1-2+ pitting edema bilateral lower extremities   Assessment/Plan:  Principal Problem:   Acute on chronic combined systolic and diastolic congestive heart failure (HCC) Active Problems:   Morbid obesity (HCC)   Hypertension   Bilateral leg edema   Atrial fibrillation and flutter (HCC)   OSA (obstructive sleep apnea)   CHF (congestive heart failure) ([place])  Frank Morales is a 57 y.o male with HFpEF who was admitted on 01/22/2018 with signs and symptoms of volume overload secondary to an acute heart failure exacerbation in the setting of dietary and medication nonadherence.   Acute on Chronic HFpEF Acute Hypoxic Respiratory Failure  - Likely secondary to dietary and medication nonadherence -Repeat echocardiogram with LVEF 55-60% with LAE, decreased RV systolic function, and RAE - net negative 3.6 L since admission; he has only dropped about 1 kg since admission; based on reported dry weight, still needs to lose about 10 kg.  - will increase diuresis to Lasix 80 mg TID -renal function, potassium, and mg stable; continue to trend daily -Wean oxygen as  tolerated, maintain oxygen sat >88%  CAP - respiratory viral panel + for rhinovirus - given focal infiltrate seen in RLL will continue treatment for bacterial CAP with 5 day course of Azithromycin  - this may also be contributing to his new O2 requirement   Atrial Fibrillation  - Rate control with diltiazem 360 mg QD  - Anticoagulation with Xarelto  - telemetry reviewed; patient's rate is persistently around 110. He is high dose of Dilt; will not add anything further for rate control at this time   OSA  - CPAP QHS  Dispo: Anticipated discharge in approximately 3-4 day(s).   Modena Nunnery D, DO 01/26/2018, 1:59 PM Pager: [phone]

## 2018-01-26 NOTE — Progress Notes (Signed)
Pt refuses to wear CPAP tonight. Pt states that he does not wear one.

## 2018-01-27 LAB — BASIC METABOLIC PANEL
Anion gap: 6 (ref 5–15)
BUN: 8 mg/dL (ref 6–20)
CO2: 39 mmol/L — ABNORMAL HIGH (ref 22–32)
Calcium: 8.2 mg/dL — ABNORMAL LOW (ref 8.9–10.3)
Chloride: 88 mmol/L — ABNORMAL LOW (ref 98–111)
Creatinine, Ser: 0.72 mg/dL (ref 0.61–1.24)
GFR calc Af Amer: >60 mL/min (ref >60)
GFR calc non Af Amer: >60 mL/min (ref >60)
Glucose, Bld: 114 mg/dL — ABNORMAL HIGH (ref 70–99)
Potassium: 3.8 mmol/L (ref 3.5–5.1)
SODIUM: 133 mmol/L — AB (ref 135–145)

## 2018-01-27 LAB — MAGNESIUM: Magnesium: 2.2 mg/dL (ref 1.7–2.4)

## 2018-01-27 MED ORDER — IPRATROPIUM-ALBUTEROL 0.5-2.5 (3) MG/3ML IN SOLN: 3.0000 mL | Freq: Two times a day (BID) | RESPIRATORY_TRACT | Status: DC

## 2018-01-27 MED ORDER — IPRATROPIUM-ALBUTEROL 0.5-2.5 (3) MG/3ML IN SOLN
3.0000 mL | Freq: Two times a day (BID) | RESPIRATORY_TRACT | Status: DC
  Administered 2018-01-27 – 2018-02-02 (×12): 3 mL via RESPIRATORY_TRACT
  Filled 2018-01-27 (×12): qty 3

## 2018-01-27 MED ORDER — IPRATROPIUM-ALBUTEROL 0.5-2.5 (3) MG/3ML IN SOLN: 3.0000 mL | Freq: Three times a day (TID) | RESPIRATORY_TRACT | Status: DC

## 2018-01-27 NOTE — Progress Notes (Signed)
Pt is on Afib HR  has been in low 100's

## 2018-01-27 NOTE — Plan of Care (Signed)
  Problem: Education: Goal: Knowledge of General Education information will improve Description Including pain rating scale, medication(s)/side effects and non-pharmacologic comfort measures Outcome: Progressing   Problem: Health Behavior/Discharge Planning: Goal: Ability to manage health-related needs will improve Outcome: Progressing   Problem: Clinical Measurements: Goal: Ability to maintain clinical measurements within normal limits will improve Outcome: Progressing Goal: Will remain free from infection Outcome: Progressing Goal: Diagnostic test results will improve Outcome: Progressing Goal: Respiratory complications will improve Outcome: Progressing Goal: Cardiovascular complication will be avoided Outcome: Progressing   Problem: Activity: Goal: Risk for activity intolerance will decrease Outcome: Progressing   Problem: Nutrition: Goal: Adequate nutrition will be maintained Outcome: Progressing   Problem: Coping: Goal: Level of anxiety will decrease Outcome: Progressing   Problem: Elimination: Goal: Will not experience complications related to bowel motility Outcome: Progressing Goal: Will not experience complications related to urinary retention Outcome: Progressing   Problem: Pain Managment: Goal: General experience of comfort will improve Outcome: Progressing   Problem: Pain Managment: Goal: General experience of comfort will improve Outcome: Progressing   Problem: Safety: Goal: Ability to remain free from injury will improve Outcome: Progressing   Problem: Skin Integrity: Goal: Risk for impaired skin integrity will decrease Outcome: Progressing   Problem: Activity: Goal: Capacity to carry out activities will improve Outcome: Progressing   Problem: Cardiac: Goal: Ability to achieve and maintain adequate cardiopulmonary perfusion will improve Outcome: Progressing

## 2018-01-27 NOTE — Progress Notes (Signed)
   Subjective: Frank Morales was seen and evaluated at bedside. No acute events overnight. States his breathing has somewhat improved. Still has significant swelling in his abdomen and legs. No change in his cough. Denies fevers, chills, chest pain, light headedness, n/v. Discussed plan for continued diuresis and breathing treatment to help with his wheezing. All questions and concerns were addressed.   Objective:  Vital signs in last 24 hours: Vitals:   01/27/18 0350 01/27/18 0757 01/27/18 0817 01/27/18 1134  BP: 110/75  120/75 99/75  Pulse: (!) 102  (!) 101 (!) 112  Resp: 20     Temp: 98.3 F (36.8 C)   98.4 F (36.9 C)  TempSrc: Oral   Oral  SpO2: 94% 92%  94%  Weight: (!) 163.4 kg     Height:       General: awake, alert, sitting up in bed in NAD Neck: elevated JVP CV: irregularly irregular, tachycardic Pulm: normal respiratory effort; lung sounds diminished at bases with scattered expiratory wheezing throughout Abd: BS+; abdomen is firm, distended  Ext: 1-2+ pitting edema in bilateral lower extremities   Assessment/Plan:  Principal Problem:   Acute on chronic combined systolic and diastolic congestive heart failure (HCC) Active Problems:   Morbid obesity (HCC)   Hypertension   Bilateral leg edema   Atrial fibrillation and flutter (HCC)   OSA (obstructive sleep apnea)   CHF (congestive heart failure) ([place])  Frank Morales is a 57 y.o male with HFpEF who was admitted on 01/22/2018 with signs and symptoms of volume overload secondary to an acute heart failure exacerbation in the setting of dietary and medication nonadherence.   Acute on Chronic HFpEF Acute Hypoxic Respiratory Failure  - Likely secondary to dietary and medication nonadherence -Repeat echocardiogram with LVEF 55-60% with LAE, decreased RV systolic function, and RAE -had 4.6 L urine output yesterday and dropped 1 kg - continue aggressive diuresis with Lasix 120 TID -renal function, potassium, and mg  stable; continue to trend daily - bicarb trending up to 39; he is chronically elevated likely in the setting of OHS. Will consider adding acetazolamide if continues to trend up.  -Wean oxygen as tolerated, maintain oxygen sat >88%  CAP - respiratory viral panel + for rhinovirus - given focal infiltrate seen in RLL will continue treatment for bacterial CAP with 5 day course of Azithromycin  - this may also be contributing to his new O2 requirement  - wheezing on exam; will treat with duonebs prn   Atrial Fibrillation  - Rate control with diltiazem 360 mg QD  - Anticoagulation with Xarelto  - telemetry reviewed; patient's rate is persistently around 110.  - if he does not respond to aggressive diuresis, may need better rate control   OSA  - CPAP QHS  Dispo: Anticipated discharge in approximately 4-5 day(s).   Modena Nunnery D, DO 01/27/2018, 1:57 PM Pager: [phone]

## 2018-01-28 DIAGNOSIS — I4819 Other persistent atrial fibrillation: Secondary | ICD-10-CM

## 2018-01-28 LAB — MAGNESIUM: Magnesium: 1.9 mg/dL (ref 1.7–2.4)

## 2018-01-28 LAB — BASIC METABOLIC PANEL
Anion gap: 7 (ref 5–15)
BUN: 10 mg/dL (ref 6–20)
CO2: 36 mmol/L — ABNORMAL HIGH (ref 22–32)
Calcium: 8.5 mg/dL — ABNORMAL LOW (ref 8.9–10.3)
Chloride: 90 mmol/L — ABNORMAL LOW (ref 98–111)
Creatinine, Ser: 0.81 mg/dL (ref 0.61–1.24)
GFR calc Af Amer: >60 mL/min (ref >60)
GFR calc non Af Amer: >60 mL/min (ref >60)
Glucose, Bld: 100 mg/dL — ABNORMAL HIGH (ref 70–99)
Potassium: 4.4 mmol/L (ref 3.5–5.1)
Sodium: 133 mmol/L — ABNORMAL LOW (ref 135–145)

## 2018-01-28 MED ORDER — MAGNESIUM SULFATE 2 GM/50ML IV SOLN
2.0000 g | Freq: Once | INTRAVENOUS | Status: AC
  Administered 2018-01-28: 2 g via INTRAVENOUS
  Filled 2018-01-28: qty 50

## 2018-01-28 MED ORDER — DILTIAZEM HCL ER 60 MG PO CP12
120.0000 mg | ORAL_CAPSULE | Freq: Two times a day (BID) | ORAL | Status: DC
  Filled 2018-01-28: qty 2

## 2018-01-28 MED ORDER — DILTIAZEM HCL ER COATED BEADS 240 MG PO CP24
480.0000 mg | ORAL_CAPSULE | Freq: Every day | ORAL | Status: DC
  Administered 2018-01-29 – 2018-02-02 (×5): 480 mg via ORAL
  Filled 2018-01-28 (×5): qty 2

## 2018-01-28 MED ORDER — DILTIAZEM HCL ER 60 MG PO CP12
120.0000 mg | ORAL_CAPSULE | Freq: Once | ORAL | Status: AC
  Administered 2018-01-28: 120 mg via ORAL
  Filled 2018-01-28: qty 2

## 2018-01-28 NOTE — Progress Notes (Signed)
Notified Dr Koleen Distance that patient qualifies for home O2.

## 2018-01-28 NOTE — Care Management Note (Signed)
Case Management Note  Patient Details  Name: Frank Morales MRN: [id number] Date of Birth: May 24, 1960  Subjective/Objective:                    Action/Plan:  Spoke to patient at bedside. He states that he lives at home w his uncle. Currently he qualifies for home O2, is still diuresing, will arrange home oxygen closer to [place] if he still needs it. He does not have any preference for provider.  Ambulated independently. No DME needs.   Expected Discharge Date:                  Expected Discharge Plan:  Home/Self Care  In-House Referral:     Discharge planning Services  CM Consult  Post Acute Care Choice:    Choice offered to:     DME Arranged:    DME Agency:     HH Arranged:    HH Agency:     Status of Service:  In process, will continue to follow  If discussed at Long Length of Stay Meetings, dates discussed:    Additional Comments:  Carles Collet, RN 01/28/2018, 12:49 PM

## 2018-01-28 NOTE — Progress Notes (Signed)
   Subjective: Frank Morales was seen and evaluated at bedside. No acute events overnight. Patient feels his breathing has improved. Still uses the oxygen intermittently. Also notes his abdomen feels less distended. Still complains of intermittent cough. Did not really notice much improvement with breathing treatments. Endorses good appetite and urine output. Denies fevers, chills, light headedness, chest pain. Discussed plan to try to get his heart rate better controlled and continue diuresis. All questions and concerns were addressed.   Objective:  Vital signs in last 24 hours: Vitals:   01/27/18 2004 01/27/18 2016 01/28/18 0350 01/28/18 0500  BP: 116/80  (!) 148/88   Pulse: (!) 107  96   Resp: 16  18   Temp: 98.1 F (36.7 C)  (!) 101 F (38.3 C) 99.1 F (37.3 C)  TempSrc: Oral  Oral Oral  SpO2: 98% 96% 98%   Weight:   (!) 162.8 kg   Height:       General: awake, alert, sitting up in his chair in NAD Neck: JVP elevated but improved CV: tachycardic, irregularly irregular; no murmurs appreciated Pulm: normal respiratory effort; lung sounds diminished at bases. No wheezing or crackles  Abd: abdomen is still distended, but less tight compared to yesterday  Ext: 1-2+ pitting edema in bilateral lower extremities; unchanged from previous exams   Assessment/Plan:  Principal Problem:   Acute on chronic combined systolic and diastolic congestive heart failure (HCC) Active Problems:   Morbid obesity (HCC)   Hypertension   Bilateral leg edema   Atrial fibrillation and flutter (HCC)   OSA (obstructive sleep apnea)   CHF (congestive heart failure) ([place])  Frank Morales is a 57 y.o male with HFpEF who was admitted on 01/22/2018 with signs and symptoms of volume overload secondary to an acute heart failure exacerbation in the setting of dietary and medication nonadherence.   Acute on Chronic HFpEF Acute Hypoxic Respiratory Failure  - Likely secondary to dietary and medication  nonadherence -Repeat echocardiogram with LVEF 55-60% with LAE, decreased RV systolic function, and RAE - making gradual progress; patient has improvement in his breathing -had 4.1 L urine output yesterday and dropped another kg - repleting mag; renal function, bicarb and potassium are stable - correction from yesterday, he has been diuresing with Lasix 80 TID which we will continue  -Wean oxygen as tolerated, maintain oxygen sat >88%  CAP -respiratory viral panel + for rhinovirus -treating with Azithromycin x 5 days for possible bacterial etiology given RLL infiltrate on CXR; patient will receive last dose tomorrow  - continue duonebs prn   Atrial Fibrillation  - telemetry reviewed; patient's heart rate persistently in 105-110 range  - increasing Diltiazem to 480 mg for better rate control  - Anticoagulation with Xarelto  OSA  - CPAP QHS  Dispo: Patient requires prolonged hospitalization due to significant volume overload in the setting of heart failure. Diuresing well but still has several days to go.   Modena Nunnery D, DO 01/28/2018, 6:15 AM Pager: [phone]

## 2018-01-28 NOTE — Evaluation (Signed)
Physical Therapy Evaluation & Discharge Patient Details Name: Frank Morales MRN: [id number] DOB: 08/24/1960 Today's Date: 01/28/2018   History of Present Illness  Pt is a 57 y.o. male admitted 01/22/18 with volume overload secondary to an acute HF exacerbation. Also with CAP and atrial fibrillation. PMH includes CHF, Hep-C, a-fib, OSA.    Clinical Impression  Patient evaluated by Physical Therapy with no further acute PT needs identified. PTA, pt indep and lives with family. Today, pt indep with mobility; limited by decreased activity tolerance. SpO2 down to 84% on RA, returning to 90% on 2L O2 [place] (see SATURATIONS QUALIFICATIONS note). Education provided regarding activity with CHF and energy conservation (handout provided). All education has been completed and the patient has no further questions. Encouraged frequent, shorter bouts of amb during hospital admission (to ask RN/NT for assist with O2 setup). Acute PT is signing off. Thank you for this referral.    Follow Up Recommendations No PT follow up;Supervision - Intermittent    Equipment Recommendations  None recommended by PT    Recommendations for Other Services       Precautions / Restrictions Precautions Precautions: None Precaution Comments: Watch SpO2 Restrictions Weight Bearing Restrictions: No      Mobility  Bed Mobility               General bed mobility comments: Received sitting in recliner  Transfers Overall transfer level: Independent Equipment used: None                Ambulation/Gait Ambulation/Gait assistance: Independent Gait Distance (Feet): 160 Feet Assistive device: None;IV Pole Gait Pattern/deviations: Step-through pattern;Decreased stride length;Wide base of support Gait velocity: Decreased Gait velocity interpretation: 1.31 - 2.62 ft/sec, indicative of limited community ambulator General Gait Details: Slow, steady amb 80' requiring seated rest break secondary to DOE, SpO2 down to  84% on RA; amb additional 80' with SpO2 90% on 2L O2 [place]  Stairs            Wheelchair Mobility    Modified Rankin (Stroke Patients Only)       Balance Overall balance assessment: No apparent balance deficits (not formally assessed)                                           Pertinent Vitals/Pain Pain Assessment: No/denies pain    Home Living Family/patient expects to be discharged to:: Private residence Living Arrangements: Other relatives Available Help at Discharge: Family;Available 24 hours/day Type of Home: House Home Access: Level entry     Home Layout: One level Home Equipment: None      Prior Function Level of Independence: Independent         Comments: Does not work; reports he sits majority of day. Does not wear home O2     Hand Dominance        Extremity/Trunk Assessment   Upper Extremity Assessment Upper Extremity Assessment: Overall WFL for tasks assessed    Lower Extremity Assessment Lower Extremity Assessment: Overall WFL for tasks assessed       Communication   Communication: No difficulties  Cognition Arousal/Alertness: Awake/alert Behavior During Therapy: WFL for tasks assessed/performed Overall Cognitive Status: Within Functional Limits for tasks assessed  General Comments      Exercises     Assessment/Plan    PT Assessment Patent does not need any further PT services  PT Problem List         PT Treatment Interventions      PT Goals (Current goals can be found in the Care Plan section)  Acute Rehab PT Goals PT Goal Formulation: All assessment and education complete, DC therapy    Frequency     Barriers to discharge        Co-evaluation               AM-PAC PT "6 Clicks" Daily Activity  Outcome Measure Difficulty turning over in bed (including adjusting bedclothes, sheets and blankets)?: None Difficulty moving from lying on  back to sitting on the side of the bed? : None Difficulty sitting down on and standing up from a chair with arms (e.g., wheelchair, bedside commode, etc,.)?: None Help needed moving to and from a bed to chair (including a wheelchair)?: None Help needed walking in hospital room?: None Help needed climbing 3-5 steps with a railing? : A Little 6 Click Score: 23    End of Session Equipment Utilized During Treatment: Oxygen Activity Tolerance: Patient tolerated treatment well Patient left: in chair;with call bell/phone within reach Nurse Communication: Mobility status PT Visit Diagnosis: Other abnormalities of gait and mobility (R26.89)    Time: 4128-7867 PT Time Calculation (min) (ACUTE ONLY): 19 min   Charges:   PT Evaluation $PT Eval Moderate Complexity: Watkins, PT, DPT Acute Rehabilitation Services  Pager [phone] Office [place] 01/28/2018, 9:52 AM

## 2018-01-28 NOTE — Progress Notes (Signed)
SATURATION QUALIFICATIONS: (This note is used to comply with regulatory documentation for home oxygen)  Patient Saturations on Room Air at Rest = 89%  Patient Saturations on Room Air while Ambulating = 84%  Patient Saturations on 2 Liters of oxygen while Ambulating = 90%  Please briefly explain why patient needs home oxygen: Pt requires supplemental oxygen to maintain SpO2 >/90% with mobility.  Mabeline Caras, PT, DPT Acute Rehabilitation Services  Pager [phone] Office [phone]

## 2018-01-29 LAB — BASIC METABOLIC PANEL
Anion gap: 9 (ref 5–15)
BUN: 9 mg/dL (ref 6–20)
CO2: 36 mmol/L — ABNORMAL HIGH (ref 22–32)
Calcium: 8.4 mg/dL — ABNORMAL LOW (ref 8.9–10.3)
Chloride: 89 mmol/L — ABNORMAL LOW (ref 98–111)
Creatinine, Ser: 0.86 mg/dL (ref 0.61–1.24)
GFR calc Af Amer: >60 mL/min (ref >60)
GFR calc non Af Amer: >60 mL/min (ref >60)
Glucose, Bld: 134 mg/dL — ABNORMAL HIGH (ref 70–99)
Potassium: 3.7 mmol/L (ref 3.5–5.1)
Sodium: 134 mmol/L — ABNORMAL LOW (ref 135–145)

## 2018-01-29 LAB — MAGNESIUM: Magnesium: 2.2 mg/dL (ref 1.7–2.4)

## 2018-01-29 NOTE — Progress Notes (Signed)
   Subjective: Mr. Para was seen and evaluated at bedside on morning rounds. No acute events overnight. His breathing continues to improve. Still endorses intermittent cough. His abdomen is feeling less distended. Denies lightheadedness, fevers, chills, chest pain. Discussed that he is making good progress, but still have some work to do to get fluid off. All questions and concern were addressed.   Objective:  Vital signs in last 24 hours: Vitals:   01/28/18 2230 01/29/18 0356 01/29/18 0503 01/29/18 0844  BP: (!) 99/55 102/63    Pulse: 97 (!) 114 92   Resp:  20    Temp:  97.6 F (36.4 C)    TempSrc:  Oral    SpO2:  95%  98%  Weight:   (!) 160.9 kg   Height:       General: awake, alert, sitting up in bed in NAD  Neck: elevated JVP to earlobe CV: tachy, irregular Pulm: normal respiratory effort; diminished at the bases; no crackles or wheezes appreciated Abd: mildly distended; non-tender Ext: compression hose on; 1+ pitting edema bilaterally  Assessment/Plan:  Principal Problem:   Acute on chronic combined systolic and diastolic congestive heart failure (HCC) Active Problems:   Morbid obesity (HCC)   Hypertension   Bilateral leg edema   Atrial fibrillation and flutter (HCC)   OSA (obstructive sleep apnea)   CHF (congestive heart failure) ([place])  Frank Morales is a 57 y.o male with HFpEF who was admitted on 01/22/2018 with signs and symptoms of volume overload secondary to an acute heart failure exacerbation in the setting of dietary and medication nonadherence.   Acute on Chronic HFpEF Acute Hypoxic Respiratory Failure  - Likely secondary to dietary and medication nonadherence -Repeat echocardiogram with LVEF 55-60% with LAE, decreased RV systolic function, and RAE - continues to diurese well; 3.6 L out; down another 2 kg today - renal function and electrolytes are stable; continue to monitor daily -continue IV Lasix 80 TID -Wean oxygen as tolerated, maintain  oxygen sat >88%  CAP - completed 5 day course of Azithromycin for possible bacterial pneumonia as evidenced by RLL infiltrate on CXR   Atrial Fibrillation  - telemetry reviewed; patient's heart rate 90-105 which is slightly improved from yesterday - continue newly increased dose of Dilt 480 mg - Anticoagulation with Xarelto  OSA  - CPAP QHS  Dispo: Anticipated discharge in approximately 4-5 day(s).   Modena Nunnery D, DO 01/29/2018, 11:50 AM Pager: [phone]

## 2018-01-29 NOTE — Progress Notes (Signed)
Patient refuses CPAP. Patient is on 4LNC with 02 saturations at 98%

## 2018-01-30 DIAGNOSIS — I482 Chronic atrial fibrillation, unspecified: Secondary | ICD-10-CM

## 2018-01-30 LAB — BASIC METABOLIC PANEL
Anion gap: 7 (ref 5–15)
BUN: 7 mg/dL (ref 6–20)
CO2: 40 mmol/L — ABNORMAL HIGH (ref 22–32)
Calcium: 8.6 mg/dL — ABNORMAL LOW (ref 8.9–10.3)
Chloride: 89 mmol/L — ABNORMAL LOW (ref 98–111)
Creatinine, Ser: 0.94 mg/dL (ref 0.61–1.24)
GFR calc Af Amer: >60 mL/min (ref >60)
GFR calc non Af Amer: >60 mL/min (ref >60)
Glucose, Bld: 107 mg/dL — ABNORMAL HIGH (ref 70–99)
Potassium: 4.1 mmol/L (ref 3.5–5.1)
Sodium: 136 mmol/L (ref 135–145)

## 2018-01-30 LAB — MAGNESIUM: Magnesium: 2.1 mg/dL (ref 1.7–2.4)

## 2018-01-30 NOTE — Progress Notes (Signed)
   Subjective: Frank Morales was seen and evaluated at bedside on morning rounds. No acute events overnight. His breathing and cough have continued to improve. Endorses good urine output. No chest pain or orthopnea. Patient is anxious to get home and reiterates that his most recent dry weight is closer to 350. Discussed that we are making good progress, but would like to see him with saturating well off of oxygen since he has not required it in the past.   Objective:  Vital signs in last 24 hours: Vitals:   01/30/18 0428 01/30/18 0430 01/30/18 0903 01/30/18 1447  BP: 110/87   123/64  Pulse: 91   76  Resp: 18   18  Temp: 99 F (37.2 C)     TempSrc: Oral     SpO2: 97%  92% 100%  Weight:  (!) 160.4 kg    Height:       General: awake, alert, sitting up in bed in NAD Neck: JVP elevated but improving CV: irregular with normal rate Pulm: normal respiratory effort; diminished breath sounds at bases; no crackles or wheezing  Abd: improving abdominal distention  Ext: 1+ pitting edema bilaterally    Assessment/Plan:  Principal Problem:   Acute on chronic combined systolic and diastolic congestive heart failure (HCC) Active Problems:   Morbid obesity (HCC)   Hypertension   Bilateral leg edema   Atrial fibrillation and flutter (HCC)   OSA (obstructive sleep apnea)   CHF (congestive heart failure) ([place])  Frank Morales is a 57 y.o male with HFpEF who was admitted on 01/22/2018 with signs and symptoms of volume overload secondary to an acute heart failure exacerbation in the setting of dietary and medication nonadherence.   Acute on Chronic HFpEF Acute Hypoxic Respiratory Failure  - Likely secondary to dietary and medication nonadherence -Repeat echocardiogram with LVEF 55-60% with LAE, decreased RV systolic function, and RAE - volume status continues to improve with diuresis; would like to try to get him off O2 requirement which may take another couple of days of diuresing  - renal  function and electrolytes are stable; continue to monitor daily -continue IV Lasix 80 TID; consider adding acetazolamide if bicarb continues to rise  -Wean oxygen as tolerated, maintain oxygen sat >88%  CAP - completed 5 day course of Azithromycin for possible bacterial pneumonia as evidenced by RLL infiltrate on CXR  - cough has improved   Atrial Fibrillation  - telemetry reviewed;patient's heart rate ranging 80s-90s  -continue Dilt 480 mg - Anticoagulation with Xarelto  OSA  - CPAP QHS  Dispo: Anticipated discharge in approximately 2-3 day(s).   Modena Nunnery D, DO 01/30/2018, 4:36 PM Pager: [phone]

## 2018-01-30 NOTE — Plan of Care (Signed)
Nutrition Education Note  RD pulled to chart due to LOS. Pt was admitted with CHF and is followed by heart failure clinic. RD evaluated for educational needs.   Spoke with pt, who reports that he has been working hard on trying to reduce sodium in his diet. He is very forthcoming about his short-comings as far as diet compliance ("the diet I have here is different from home- I like to eat my bacon and sausage"). PTA pt was consuming 2 meals per day (Breakfast: grits or oatmeal, eggs, and bacon, Dinner: baked chicken or fried fish with mashed potatoes or rice). Pt reports he also snacks on chicken salad (which he makes with mustard) and crackers in the afternoon. Pt denies much fluid intake, but will occassionally consume some water. He also admits to snacking on ice.   Pt has been very diligent with his weight records, which he shared with this RD. He feels like he has a good routine when he is eating at home, however struggles when he is eating outside the home. He reports "with birthdays, holidays, and homecoming tailgating my weight went up and up until I couldn't do anything about it".   RD spent most visit discussing continued self-management as well as ways he could modify current diet to decreased additional sodium and fat in his diet. Pt enjoys cooking and has worked in [employer]- he was very interested in additional ways he could season his food other than salt and Mrs Deliah [place].   RD provided "Low Sodium Nutrition Therapy" and "Sodium Free Seasoning Tips" handouts from the Academy of [employer] and [employer]etics. Reviewed patient's dietary recall. Provided examples on ways to decrease sodium intake in diet. Discouraged intake of processed foods and use of salt shaker. Encouraged fresh fruits and vegetables as well as whole grain sources of carbohydrates to maximize fiber intake.   RD discussed why it is important for patient to adhere to diet recommendations, and emphasized the role of  fluids, foods to avoid, and importance of weighing self daily. Teach back method used.  Expect fair compliance.  Body mass index is 49.33 kg/m. Pt meets criteria for extreme obesity, class III based on current BMI.  Current diet order is Heart Healthy with 1500 ml fluid restriction, patient is consuming approximately 100% of meals at this time. Labs and medications reviewed. No further nutrition interventions warranted at this time. RD contact information provided. If additional nutrition issues arise, please re-consult RD.   Joseff Luckman A. Jimmye Norman, RD, LDN, CDE Pager: [phone] After hours Pager: [phone]

## 2018-01-30 NOTE — Progress Notes (Signed)
RT asked patient if he wanted to use CPAP while here.  Patient stated that he did not want no CPAP.

## 2018-01-30 NOTE — Progress Notes (Signed)
SATURATION QUALIFICATIONS: (This note is used to comply with regulatory documentation for home oxygen)  Patient Saturations on Room Air at Rest = 98%  Patient Saturations on Room Air while Ambulating = 78% without 02  Patient Saturations on 2 Liters of oxygen while Ambulating = 98%  Please briefly explain why patient needs home oxygen:  Pt desats rapidly without recovery when ambulating without 02.

## 2018-01-31 DIAGNOSIS — Z8701 Personal history of pneumonia (recurrent): Secondary | ICD-10-CM

## 2018-01-31 DIAGNOSIS — E662 Morbid (severe) obesity with alveolar hypoventilation: Secondary | ICD-10-CM

## 2018-01-31 LAB — BASIC METABOLIC PANEL
Anion gap: 6 (ref 5–15)
BUN: 7 mg/dL (ref 6–20)
CO2: 38 mmol/L — ABNORMAL HIGH (ref 22–32)
Calcium: 8.8 mg/dL — ABNORMAL LOW (ref 8.9–10.3)
Chloride: 91 mmol/L — ABNORMAL LOW (ref 98–111)
Creatinine, Ser: 0.78 mg/dL (ref 0.61–1.24)
GFR calc Af Amer: >60 mL/min (ref >60)
GFR calc non Af Amer: >60 mL/min (ref >60)
Glucose, Bld: 122 mg/dL — ABNORMAL HIGH (ref 70–99)
Potassium: 3.5 mmol/L (ref 3.5–5.1)
Sodium: 135 mmol/L (ref 135–145)

## 2018-01-31 LAB — MAGNESIUM: Magnesium: 2.2 mg/dL (ref 1.7–2.4)

## 2018-01-31 NOTE — Progress Notes (Signed)
Pt refused CPAP for the night. Will continue to montior as needed.

## 2018-01-31 NOTE — Progress Notes (Signed)
Pt refused bed alarm to be turned on

## 2018-01-31 NOTE — Progress Notes (Signed)
   Subjective: Patient is doing well this AM. He continues to have a productive cough but feels it is better than when he came in. He is ambulating frequently. Wants to go home as soon as possible. He is continuing to have good urine output with the IV Lasix. Discussed that he still appears volume overload on exam and his ambulatory pulse ox yesterday illustrated desaturation on room air. We discussed that he likely needs 1-2 more days of IV diureses before discharging home. All questions and concerns addressed.   Objective: Vital signs in last 24 hours: Vitals:   01/31/18 0032 01/31/18 0500 01/31/18 0646 01/31/18 0733  BP:   121/75   Pulse:   97   Resp:   20   Temp:   98 F (36.7 C)   TempSrc:   Oral   SpO2:   100% 98%  Weight: (!) 161.3 kg (!) 161.3 kg    Height:       General: Obese male in no acute distress Pulm: Good air movement with no wheezing or crackles  CV: Irregular with normal rate   Abdomen: Firm, distended, no tenderness to palpation  Extremities: Mild pitting LE edema   Assessment/Plan:  Nocholas Morales is a 57 y.o male with HFpEF who was admitted on 01/22/2018 with signs and symptoms of volume overload secondary to an acute heart failure exacerbation in the setting of dietary and medication nonadherence.   Acute on Chronic HFpEF Acute Hypoxic Respiratory Failure  - Likely secondary to dietary and medication nonadherence -Repeat echocardiogram with LVEF 55-60% with LAE, decreased RV systolic function, and RAE -Volume status continues to improve with diuresis; but is still requiring oxygen with ambulation - Negative 1.4L yesterday  - Telemetry reviewed: Rate controlled atrial fibrillation -Renal function, potassium, and magnesium are stable.  -Continue IV Lasix 80 TID; will consider adding acetazolamide if bicarb continues to rise  -Wean oxygen as tolerated, maintain oxygen sat >88%  Atrial Fibrillation  - Telemetry reviewed;rate controlled  -Continue  Dilt 480 mg - Anticoagulation with Xarelto  OSA  - CPAP QHS  CAP. Resolved s/p 5 days of Azithromycin   Dispo: Anticipated discharge in approximately 1-2 day(s). Patient still volume overload and requiring intermittent oxygen therapy. Approaching euvolemia but not quite there. Anticipate this will take 1-2 more days of IV diureses.   [name], MD 01/31/2018, 8:13 AM

## 2018-02-01 DIAGNOSIS — I110 Hypertensive heart disease with heart failure: Principal | ICD-10-CM

## 2018-02-01 LAB — BASIC METABOLIC PANEL
Anion gap: 8 (ref 5–15)
BUN: 8 mg/dL (ref 6–20)
CO2: 36 mmol/L — ABNORMAL HIGH (ref 22–32)
Calcium: 8.9 mg/dL (ref 8.9–10.3)
Chloride: 91 mmol/L — ABNORMAL LOW (ref 98–111)
Creatinine, Ser: 0.89 mg/dL (ref 0.61–1.24)
GFR calc Af Amer: >60 mL/min (ref >60)
GFR calc non Af Amer: >60 mL/min (ref >60)
Glucose, Bld: 129 mg/dL — ABNORMAL HIGH (ref 70–99)
Potassium: 3.6 mmol/L (ref 3.5–5.1)
Sodium: 135 mmol/L (ref 135–145)

## 2018-02-01 LAB — MAGNESIUM: Magnesium: 2 mg/dL (ref 1.7–2.4)

## 2018-02-01 NOTE — Progress Notes (Signed)
Patient refuses CPAP at this time.

## 2018-02-01 NOTE — Progress Notes (Signed)
   Subjective: Patient doing well this AM. He continues to have good urine output, tolerate PO intake, and ambulate without difficulty. He reports that his ambulatory pulse ox showed him desaturating. We discussed that he needs some more IV diureses today. He would like to go home but understands. All questions and concerns addressed.   Objective: Vital signs in last 24 hours: Vitals:   02/01/18 0639 02/01/18 0641 02/01/18 0642 02/01/18 0758  BP:      Pulse:      Resp:      Temp:      TempSrc:      SpO2: 96% (!) 86% 98% 99%  Weight:      Height:       General: Obese male in no acute distress Pulm: Good air movement with bibasilar crackles  CV: Irregular with normal rate Abdomen: Firm, distended, no tenderness to palpation  Extremities: Mild pitting LE edema   Assessment/Plan:  Frank Morales is a 57 y.o male with HFpEF who was admitted on 01/22/2018 with signs and symptoms of volume overload secondary to an acute heart failure exacerbation in the setting of dietary and medication nonadherence.   Acute on Chronic HFpEF Acute Hypoxic Respiratory Failure  - Likely secondary to dietary and medication nonadherence -Repeat echocardiogram with LVEF 55-60% with LAE, decreased RV systolic function, and RAE -Volume status continues to improve with diuresis - Desaturation with ambulatory pulse ox today  - Weight down 7 kg (17lbs) since admission, negative 2.3L over past 24 hours  - Telemetry reviewed: Rate controlled A-fib -Renal function, potassium, and magnesium are stable  -Continue IV Lasix 80 TID; will consider adding acetazolamide if bicarb continues to rise -Wean oxygen as tolerated, maintain oxygen sat >88%  Atrial Fibrillation  - Telemetry reviewed; Rate controlled A-fib -Continue Dilt 480 mg - Anticoagulation with Xarelto  OSA  - CPAP QHS  CAP. Resolved s/p 5 days of Azithromycin   Dispo: Anticipated discharge in approximately 0-1 day(s) pending improvement  in volume status. Patient not on oxygen at home but requiring oxygen with ambulation. High risk for readmission if discharged prior to achieving euvolemia.   Frank Homes, MD 02/01/2018, 10:20 AM

## 2018-02-02 ENCOUNTER — Telehealth: Payer: Self-pay

## 2018-02-02 DIAGNOSIS — E739 Lactose intolerance, unspecified: Secondary | ICD-10-CM

## 2018-02-02 LAB — BASIC METABOLIC PANEL
Anion gap: 5 (ref 5–15)
BUN: 9 mg/dL (ref 6–20)
CHLORIDE: 93 mmol/L — AB (ref 98–111)
CO2: 37 mmol/L — AB (ref 22–32)
CREATININE: 0.83 mg/dL (ref 0.61–1.24)
Calcium: 8.7 mg/dL — ABNORMAL LOW (ref 8.9–10.3)
GFR calc Af Amer: >60 mL/min (ref >60)
GFR calc non Af Amer: >60 mL/min (ref >60)
Glucose, Bld: 103 mg/dL — ABNORMAL HIGH (ref 70–99)
Potassium: 4.3 mmol/L (ref 3.5–5.1)
SODIUM: 135 mmol/L (ref 135–145)

## 2018-02-02 LAB — MAGNESIUM: Magnesium: 2.3 mg/dL (ref 1.7–2.4)

## 2018-02-02 MED ORDER — DILTIAZEM HCL ER COATED BEADS 240 MG PO CP24: 480.0000 mg | ORAL_CAPSULE | Freq: Every day | ORAL | 0 refills | Status: DC

## 2018-02-02 NOTE — Plan of Care (Signed)
  Problem: Safety: Goal: Ability to remain free from injury will improve Outcome: Completed/Met   Problem: Nutrition: Goal: Adequate nutrition will be maintained Outcome: Completed/Met   Problem: Activity: Goal: Capacity to carry out activities will improve Outcome: Completed/Met

## 2018-02-02 NOTE — Progress Notes (Signed)
   Subjective: Frank Morales was seen and evaluated at bedside on morning rounds. No acute events overnight. He is doing well and would like to go home. Feels that he is back to his baseline. Has not been using the oxygen. Ambulating the hall without difficulty. Although he still is not quite euvolemic, he has close follow-up with the IMTS clinic and cardiology. We have discussed dietary restriction with HF and a HF action plan. All questions and concerns addressed.   Objective: Vital signs in last 24 hours: Vitals:   02/01/18 1945 02/01/18 2138 02/02/18 0420 02/02/18 1032  BP: 116/80  (!) 93/58 120/72  Pulse: 90  60 70  Resp: 20  20   Temp: 98.4 F (36.9 C)  98.3 F (36.8 C) 97.7 F (36.5 C)  TempSrc: Oral  Oral Oral  SpO2: 95% 96% 94% 93%  Weight:   (!) 159.5 kg   Height:       General: Obese male in no acute distress Pulm: Good air movement with no wheezing or crackles  CV: Irregular rhythm with normal rate  Abdomen: Firm, distended, no tenderness to palpation  Extremities: Mild LE edema   Assessment/Plan:  Frank Morales is a 57 y.o male with HFpEF who was admitted on 01/22/2018 with signs and symptoms of volume overload secondary to an acute heart failure exacerbation in the setting of dietary and medication nonadherence.   Acute on Chronic HFpEF Acute Hypoxic Respiratory Failure  - Likely secondary to dietary and medication nonadherence -Repeat echocardiogram with LVEF 55-60% with LAE, decreased RV systolic function, and RAE -Volume status continues to improve with diuresis - Weight down 7 kg (17lbs) since admission, negative 3.5L over past 24 hours  - Telemetry reviewed: Rate controlled A-fib -Renal function, potassium, and magnesium are stable -Discharge today on Furosemide 80 mg BID with close outpatient follow-up   Atrial Fibrillation  -Telemetry reviewed; Rate controlled A-fib -Continue Dilt 480 mg - Anticoagulation with Xarelto  OSA  - CPAP  QHS  CAP. Resolved s/p 5 days of Azithromycin  Dispo: Discharge home today with hospital follow-up schedule on 02/10/18.   [name], MD 02/02/2018, 11:14 AM

## 2018-02-02 NOTE — Progress Notes (Signed)
Patient discharge home per MD order, discharge teaching and summaries review with patient denies questions at this time. IV is out.

## 2018-02-02 NOTE — Discharge Summary (Signed)
Name: Frank Morales MRN: [id number] DOB: Jun 13, 1960 57 y.o. PCP: Frank Noble, MD  Date of Admission: 01/22/2018  6:11 PM Date of Discharge: 02/02/2018 Attending Physician: Frank Contes, MD  Discharge Diagnosis: 1. Acute on Chronic Diastolic Heart Failure  2. Atrial Fibrillation   Discharge Medications: Allergies as of 02/02/2018      Reactions   Lactose Intolerance (gi) Nausea And Vomiting      Medication List    STOP taking these medications   amoxicillin-clavulanate 875-125 MG tablet Commonly known as:  AUGMENTIN   doxycycline 100 MG capsule Commonly known as:  VIBRAMYCIN   guaiFENesin 600 MG 12 hr tablet Commonly known as:  MUCINEX     TAKE these medications   azelastine 0.1 % nasal spray Commonly known as:  ASTELIN Place 2 sprays into both nostrils 2 (two) times daily. Use in each nostril as directed up to twice a day.   diltiazem 240 MG 24 hr capsule Commonly known as:  CARDIZEM CD Take 2 capsules (480 mg total) by mouth daily. Start taking on:  02/03/2018 What changed:    medication strength  how much to take   fexofenadine 60 MG tablet Commonly known as:  ALLEGRA Take 1 tablet (60 mg total) by mouth 2 (two) times daily.   fluticasone 50 MCG/ACT nasal spray Commonly known as:  FLONASE Place 1 spray into both nostrils daily.   furosemide 80 MG tablet Commonly known as:  LASIX Take one  80 mg tablet by mouth   twice daily.   loratadine 10 MG tablet Commonly known as:  CLARITIN Take 1 tablet (10 mg total) by mouth daily.   nicotine 14 mg/24hr patch Commonly known as:  NICODERM CQ - dosed in mg/24 hours Place 1 patch (14 mg total) onto the skin daily.   potassium chloride 10 MEQ tablet Commonly known as:  K-DUR Take 3 (10 mEq) tablets by mouth 2 times a day   rivaroxaban 20 MG Tabs tablet Commonly known as:  XARELTO Take 1 tablet (20 mg total) by mouth daily with supper.     Disposition and follow-up:   Frank Morales  Sleep was discharged from [facility] in Stable condition.  At the hospital follow up visit please address:  1.  HF. Continue to provide education about diet and fluid restriction. Discuss whether his weight is stable. Discuss a HF action plan. Discuss whether he is having good diureses with his PO lasix. Afib. Ensure he is following up with cardiology.   2.  Labs / imaging needed at time of follow-up: None  3.  Pending labs/ test needing follow-up: None  Follow-up Appointments: Follow-up Information    Frank Noble, MD On 02/10/2018.   Specialty:  Internal Medicine Why:  @10 :15 Contact information: [address] [place] [address] [place] [facility] Course by problem list:  1. Acute on Chronic Diastolic Heart Failure. Frank Morales is a 57 y.o male with HFpEF who was admitted on 01/22/2018 with signs and symptoms of volume overload secondary to an acute heart failure exacerbation in the setting of dietary and medication nonadherence. On admission his weight was recorded at 370lbs which was above his reported dry weight of 340-350lbs. He was aggressively diuresed with IV furosemide 80 mg TID. With aggressive diureses his oxygenation improved and he was able to come off supplemental oxygen and his weight decreased to 359lbs on discharge. He was restarted on Furosemide 80 mg  BID with close outpatient follow-up. Education was provided about a HF diet. HF action plan was discussed and he will call the clinic if he notices increase in weight of 3lbs in 1 day or 5 lbs in 2 days. He will call the clinic if his furosemide is not causing him to have good urine output.    2. Atrial Fibrillation. While hospitalized the patient remained in atrial fibrillation. He was not at target rate goal of <110 BPM. His diltiazem was increased to 460 mg QD. He will continue anticoagulation and follow-up with his cardiologist as an outpatient.   Discharge Vitals:   BP  120/72 (BP Location: Left Arm)   Pulse 70   Temp 97.7 F (36.5 C) (Oral)   Resp 20   Ht 5\' 11"  (1.803 m)   Wt (!) 159.5 kg Comment: scale c  SpO2 93%   BMI 49.04 kg/m   Pertinent Labs, Studies, and Procedures:   Echocardiogram 01/23/2018  - Left ventricle: The cavity size was normal. Wall thickness was   increased in a pattern of mild LVH. Systolic function was normal.   The estimated ejection fraction was in the range of 55% to 60%.   Wall motion was normal; there were no regional wall motion   abnormalities. - Ventricular septum: Septal motion showed abnormal function and   dyssynergy. The contour showed diastolic flattening. - Mitral valve: Calcified annulus. - Left atrium: The atrium was mildly dilated. - Right ventricle: The cavity size was mildly dilated. Wall   thickness was normal. Systolic function was moderately reduced. - Right atrium: The atrium was severely dilated. - Tricuspid valve: There was mild regurgitation.  Discharge Instructions: Discharge Instructions    (HEART FAILURE PATIENTS) Call MD:  Anytime you have any of the following symptoms: 1) 3 pound weight gain in 24 hours or 5 pounds in 1 week 2) shortness of breath, with or without a dry hacking cough 3) swelling in the hands, feet or stomach 4) if you have to sleep on extra pillows at night in order to breathe.   Complete by:  As directed    Diet - low sodium heart healthy   Complete by:  As directed    Increase activity slowly   Complete by:  As directed      Signed: [name], MD 02/02/2018, 11:37 AM

## 2018-02-02 NOTE — Telephone Encounter (Signed)
Hospital TOC per Dr Tarri Abernethy, discharge 02/02/2018, appt 02/10/2018.

## 2018-02-04 NOTE — Telephone Encounter (Addendum)
Transition Care Management Follow-up Telephone Call  Date of discharge and from where: 11/18 from [facility]   How have you been since you were released from the hospital? "pretty good"  Any questions or concerns? No   Items Reviewed:  Did the pt receive and understand the discharge instructions provided? Yes   Medications obtained and verified? Yes   Any new allergies since your discharge? No   Dietary orders reviewed? Yes  Do you have support at home? No   Functional Questionnaire: (I = Independent and D = Dependent) ADLs: I  Bathing/Dressing- I  Meal Prep- I  Eating- I  Maintaining continence- I  Transferring/Ambulation- I  Managing Meds- I  Follow up appointments reviewed:   PCP Hospital f/u appt confirmed? Yes  Scheduled to see [facility] on FRI  11/22 @ [facility] f/u appt confirmed? Yes  Scheduled to see DrRoss on 12/09  @ 1120.  Are transportation arrangements needed? No   If their condition worsens, is the pt aware to call PCP or go to the Emergency Dept.? Yes  Was the patient provided with contact information for the PCP's office or ED? Yes  Was to pt encouraged to call back with questions or concerns? Yes  Pt requested an earlier HFU appt and also an appt with Pharmacy (Dr.Kim).  HFU was rescheduled to Fri 11/22.  Pt has not picked up diltiazem rx, but plan to do so today.  No further concerns at this time.  Pt will f/u on Fri as scheduled and continue to monitor his weight. Pt instructed to contact clinic should he have any additional concerns .[name]11/20/201912:04 PM

## 2018-02-06 ENCOUNTER — Ambulatory Visit (INDEPENDENT_AMBULATORY_CARE_PROVIDER_SITE_OTHER): Payer: Medicaid Other | Admitting: Internal Medicine

## 2018-02-06 ENCOUNTER — Other Ambulatory Visit: Payer: Self-pay

## 2018-02-06 ENCOUNTER — Ambulatory Visit: Payer: Medicaid Other | Admitting: Pharmacist

## 2018-02-06 VITALS — BP 133/70 | HR 59 | Temp 98.5°F | Ht 71.0 in | Wt 354.1 lb

## 2018-02-06 DIAGNOSIS — I110 Hypertensive heart disease with heart failure: Secondary | ICD-10-CM | POA: Diagnosis not present

## 2018-02-06 DIAGNOSIS — I509 Heart failure, unspecified: Secondary | ICD-10-CM | POA: Diagnosis not present

## 2018-02-06 DIAGNOSIS — Z79899 Other long term (current) drug therapy: Secondary | ICD-10-CM | POA: Diagnosis not present

## 2018-02-06 DIAGNOSIS — F17210 Nicotine dependence, cigarettes, uncomplicated: Secondary | ICD-10-CM | POA: Diagnosis not present

## 2018-02-06 DIAGNOSIS — I5032 Chronic diastolic (congestive) heart failure: Secondary | ICD-10-CM

## 2018-02-06 DIAGNOSIS — I10 Essential (primary) hypertension: Secondary | ICD-10-CM

## 2018-02-06 NOTE — Patient Instructions (Signed)
Thank you for allowing [place] to provide your care today. We discussed your recent hospitalization. Your heart failure appears to be stable today but your oxygen saturation dropped when you are walking so we recommend you use oxygen when you are exerting yourself.     Today we made no changes to your medications.  Please continue to take them as prescribed.   Please follow-up in 3 months or sooner if needed.  Please follow up with the heart failure doctors next month.    Should you have any questions or concerns please call the internal medicine clinic at [phone].

## 2018-02-06 NOTE — Progress Notes (Signed)
CC: Hospital follow up  HPI:  [name] is a 57 y.o.  with a PMH listed below presenting for hospital follow up.  He was in the hospital from 11/7-11/18 for an acute on chronic heart failure exacerbation secondary to dietary and medication adherence.  He is admitted with weight of 370 and diuresed, his discharge weight was 351.  He was discharged home on Lasix 80 mg twice daily.  Today he reports that some shortness of breath with exertion however this is been stable.  He does not have any shortness of breath when he is sitting still.  He denies any swelling of legs reports that they have gone down significantly.  He does state that he checks his weight daily.  He has been sticking to a low-sodium diet however reports that it is very difficult and that all he is eating is plain drug in, baked foods boiled eggs.  He has a culinary class set up for next week to find some of the recipes, advised patient that he can use spices other than salt to improve the taste of his meals.  He also has been adhering to the fluid restriction.  Only drinking about 2 bottles of water a day.  He has been urinating frequently and denies any issues with urination such as straining, emptying bladder, or decreased frequency.  He has a follow-up with his heart doctor Dr. Harrington Challenger on December 9. He was short of breath when he got to the clinic to the long walk from his car.    Please see A&P for status of the patient's chronic medical conditions  Past Medical History:  Diagnosis Date  . Atrial fibrillation and flutter ([place]) 08/17/2015   A. S/p failed DCCV // b. Severe BAE on echo >> rate control strategy (has seen AF clinic) // c. Xarelto for anticoag (CHADS2-VASc=2 / CHF, HTN)  . Chronic diastolic CHF (congestive heart failure) ([place]) 08/30/2015   A. Echo 6/17: Apical HK, moderate focal basal and mild concentric LVH, EF 50-55, diffuse HK, trivial MR, severe BAE, mild TR, PASP 37  . Hepatitis C, chronic ([place]) 08/19/2015    . History of cardiac catheterization    a. LHC 6/17: LAD irregs, o/w no CAD  . OSA (obstructive sleep apnea) 11/21/2015  . Sleep apnea   . Tobacco abuse    Review of Systems: Refer to history of present illness and assessment and plans for pertinent review of systems, all others reviewed and negative.  Physical Exam:  Vitals:   02/06/18 0940  BP: 133/70  Pulse: (!) 59  Temp: 98.5 F (36.9 C)  TempSrc: Oral  SpO2: 96%  Weight: (!) 354 lb 1.6 oz (160.6 kg)  Height: 5\' 11"  (1.803 m)    Physical Exam  Constitutional: He is well-developed, well-nourished, and in no distress.  HENT:  Head: Normocephalic and atraumatic.  Eyes: Pupils are equal, round, and reactive to light. Conjunctivae and EOM are normal.  Neck: Normal range of motion. Neck supple. No thyromegaly present.  Cardiovascular: Normal rate, regular rhythm and normal heart sounds.  Pulmonary/Chest: Effort normal.  Minimal crackles in upper lung fields  Abdominal: Soft. Bowel sounds are normal. He exhibits no distension.  Musculoskeletal: Normal range of motion. He exhibits edema (trace bilateral LE edema, thickened skin around shins).  Neurological: He is alert. Gait normal.  Skin: Skin is warm and dry. No rash noted.  Psychiatric: Mood and affect normal.    Social History   Socioeconomic History  .  Marital status: Single    Spouse name: Not on file  . Number of children: Not on file  . Years of education: Not on file  . Highest education level: Not on file  Occupational History  . Occupation: unemployed    Comment: Previous Soil scientist  . Financial resource strain: Not on file  . Food insecurity:    Worry: Not on file    Inability: Not on file  . Transportation needs:    Medical: Not on file    Non-medical: Not on file  Tobacco Use  . Smoking status: Current Every Day Smoker    Packs/day: 0.20    Types: Cigarettes  . Smokeless tobacco: Never Used  . Tobacco comment: down to 1-2 cig/day with  NRT  Substance and Sexual Activity  . Alcohol use: Yes    Alcohol/week: 5.0 standard drinks    Types: 5 Standard drinks or equivalent per week    Comment: 2 times a week beer  . Drug use: No  . Sexual activity: Not on file  Lifestyle  . Physical activity:    Days per week: Not on file    Minutes per session: Not on file  . Stress: Not on file  Relationships  . Social connections:    Talks on phone: Not on file    Gets together: Not on file    Attends religious service: Not on file    Active member of club or organization: Not on file    Attends meetings of clubs or organizations: Not on file    Relationship status: Not on file  . Intimate partner violence:    Fear of current or ex partner: Not on file    Emotionally abused: Not on file    Physically abused: Not on file    Forced sexual activity: Not on file  Other Topics Concern  . Not on file  Social History Narrative  . Not on file   Family History  Problem Relation Age of Onset  . Hypertension Mother   . Diabetes Mother   . Pneumonia Father   . Hypertension Maternal Grandfather   . Colon cancer Neg Hx     Assessment & Plan:   See Encounters Tab for problem based charting.  Patient seen with Dr. Evette Doffing

## 2018-02-06 NOTE — Assessment & Plan Note (Signed)
Today he reports that some shortness of breath with exertion however this is been stable.  He does not have any shortness of breath when he is sitting still.  He denies any swelling of legs reports that they have gone down significantly.  He does state that he checks his weight daily.  He has been sticking to a low-sodium diet however reports that it is very difficult and that all he is eating is plain drug in, baked foods boiled eggs.  He has a culinary class set up for next week to find some of the recipes, advised patient that he can use spices other than salt to improve the taste of his meals.  He also has been adhering to the fluid restriction.  Only drinking about 2 bottles of water a day.  He has been urinating frequently and denies any issues with urination such as straining, emptying bladder, or decreased frequency.  He has a follow-up with his heart doctor Dr. Harrington Challenger on December 9. He is having good urinary output and appears to be doing okay since he has been discharged.  He was short of breath when he got to the clinic to the long walk from his car.  On exam he had trace bilateral lower extremity edema, crackles bilateral lung fields, no JVD. Today his weight is 354, relatively stable since his admission. Oxygen saturation did drop down to 85% with ambulation.  He will need oxygen with ambulation for the time being, his shortness of breath may improve with further diuresis on his current regimen.  He does not be in acute exacerbation at this time and seems to be doing well on his diet.  Plan: -Oxygen with ambulation, 3 L kept oxygen above 88% -Continue Lasix 80 mg twice daily, continue K-Dur -Encouraged patient to continue his current diet and fluid restriction -Follow-up with Dr. Harrington Challenger in December

## 2018-02-06 NOTE — Assessment & Plan Note (Signed)
Assessment: Patient is on 20 mg daily Lasix 80 mg twice daily.  Blood pressure today was 133/70 which is at his goal.  Plan: -Continue diltiazem 2040 mg daily, and Lasix 80 mg twice daily

## 2018-02-06 NOTE — Progress Notes (Signed)
SATURATION QUALIFICATIONS: (This note is used to comply with regulatory documentation for home oxygen)  Patient Saturations on Room Air at Rest = 82%, recovered to 96%  Patient Saturations on Room Air while Ambulating = 85%  Patient Saturations on 3 Liters of oxygen while Ambulating  = 93% SChaplin, RN,BSN

## 2018-02-06 NOTE — Progress Notes (Signed)
Internal Medicine Clinic Attending  I saw and evaluated the patient.  I personally confirmed the key portions of the history and exam documented by Dr. Sherry Ruffing and I reviewed pertinent patient test results.  The assessment, diagnosis, and plan were formulated together and I agree with the documentation in the resident's note.  Correction, chief complaint is follow up of CHF.

## 2018-02-10 ENCOUNTER — Ambulatory Visit: Payer: Medicaid Other

## 2018-02-15 DIAGNOSIS — I5032 Chronic diastolic (congestive) heart failure: Secondary | ICD-10-CM | POA: Diagnosis not present

## 2018-02-15 DIAGNOSIS — I509 Heart failure, unspecified: Secondary | ICD-10-CM | POA: Diagnosis not present

## 2018-02-16 NOTE — Telephone Encounter (Signed)
HFU kept on 02/06/2018 with [facility].

## 2018-02-23 ENCOUNTER — Ambulatory Visit: Payer: Medicaid Other | Admitting: Internal Medicine

## 2018-02-24 DIAGNOSIS — J324 Chronic pansinusitis: Secondary | ICD-10-CM | POA: Diagnosis not present

## 2018-02-24 DIAGNOSIS — R0981 Nasal congestion: Secondary | ICD-10-CM | POA: Diagnosis not present

## 2018-03-03 DIAGNOSIS — J324 Chronic pansinusitis: Secondary | ICD-10-CM | POA: Diagnosis not present

## 2018-03-03 DIAGNOSIS — J329 Chronic sinusitis, unspecified: Secondary | ICD-10-CM | POA: Diagnosis not present

## 2018-03-08 DIAGNOSIS — I509 Heart failure, unspecified: Secondary | ICD-10-CM | POA: Diagnosis not present

## 2018-03-08 DIAGNOSIS — I5032 Chronic diastolic (congestive) heart failure: Secondary | ICD-10-CM | POA: Diagnosis not present

## 2018-03-14 ENCOUNTER — Encounter (HOSPITAL_COMMUNITY): Payer: Self-pay | Admitting: Emergency Medicine

## 2018-03-14 ENCOUNTER — Emergency Department (HOSPITAL_COMMUNITY): Payer: Medicaid Other

## 2018-03-14 ENCOUNTER — Inpatient Hospital Stay (HOSPITAL_COMMUNITY)
Admission: EM | Admit: 2018-03-14 | Discharge: 2018-03-22 | DRG: 292 | Disposition: A | Discharge status: Home / Self Care | Payer: Medicaid Other | Attending: Internal Medicine | Admitting: Internal Medicine

## 2018-03-14 DIAGNOSIS — G4733 Obstructive sleep apnea (adult) (pediatric): Secondary | ICD-10-CM | POA: Diagnosis present

## 2018-03-14 DIAGNOSIS — Z6841 Body Mass Index (BMI) 40.0 and over, adult: Secondary | ICD-10-CM

## 2018-03-14 DIAGNOSIS — Z9981 Dependence on supplemental oxygen: Secondary | ICD-10-CM

## 2018-03-14 DIAGNOSIS — E873 Alkalosis: Secondary | ICD-10-CM | POA: Diagnosis not present

## 2018-03-14 DIAGNOSIS — Z8249 Family history of ischemic heart disease and other diseases of the circulatory system: Secondary | ICD-10-CM

## 2018-03-14 DIAGNOSIS — I110 Hypertensive heart disease with heart failure: Principal | ICD-10-CM | POA: Diagnosis present

## 2018-03-14 DIAGNOSIS — J90 Pleural effusion, not elsewhere classified: Secondary | ICD-10-CM | POA: Diagnosis not present

## 2018-03-14 DIAGNOSIS — Z9111 Patient's noncompliance with dietary regimen: Secondary | ICD-10-CM | POA: Diagnosis not present

## 2018-03-14 DIAGNOSIS — E662 Morbid (severe) obesity with alveolar hypoventilation: Secondary | ICD-10-CM | POA: Diagnosis not present

## 2018-03-14 DIAGNOSIS — R0600 Dyspnea, unspecified: Secondary | ICD-10-CM | POA: Diagnosis present

## 2018-03-14 DIAGNOSIS — I5032 Chronic diastolic (congestive) heart failure: Secondary | ICD-10-CM | POA: Diagnosis not present

## 2018-03-14 DIAGNOSIS — I5082 Biventricular heart failure: Secondary | ICD-10-CM | POA: Diagnosis present

## 2018-03-14 DIAGNOSIS — R0902 Hypoxemia: Secondary | ICD-10-CM | POA: Diagnosis not present

## 2018-03-14 DIAGNOSIS — R0601 Orthopnea: Secondary | ICD-10-CM

## 2018-03-14 DIAGNOSIS — I5033 Acute on chronic diastolic (congestive) heart failure: Secondary | ICD-10-CM | POA: Diagnosis not present

## 2018-03-14 DIAGNOSIS — I5030 Unspecified diastolic (congestive) heart failure: Secondary | ICD-10-CM | POA: Diagnosis not present

## 2018-03-14 DIAGNOSIS — Z7984 Long term (current) use of oral hypoglycemic drugs: Secondary | ICD-10-CM | POA: Diagnosis not present

## 2018-03-14 DIAGNOSIS — Z720 Tobacco use: Secondary | ICD-10-CM

## 2018-03-14 DIAGNOSIS — R531 Weakness: Secondary | ICD-10-CM | POA: Diagnosis not present

## 2018-03-14 DIAGNOSIS — Z79899 Other long term (current) drug therapy: Secondary | ICD-10-CM | POA: Diagnosis not present

## 2018-03-14 DIAGNOSIS — Z7901 Long term (current) use of anticoagulants: Secondary | ICD-10-CM | POA: Diagnosis not present

## 2018-03-14 DIAGNOSIS — T502X5A Adverse effect of carbonic-anhydrase inhibitors, benzothiadiazides and other diuretics, initial encounter: Secondary | ICD-10-CM | POA: Diagnosis not present

## 2018-03-14 DIAGNOSIS — I4892 Unspecified atrial flutter: Secondary | ICD-10-CM | POA: Diagnosis present

## 2018-03-14 DIAGNOSIS — I509 Heart failure, unspecified: Secondary | ICD-10-CM | POA: Diagnosis not present

## 2018-03-14 DIAGNOSIS — I480 Paroxysmal atrial fibrillation: Secondary | ICD-10-CM | POA: Diagnosis not present

## 2018-03-14 DIAGNOSIS — J324 Chronic pansinusitis: Secondary | ICD-10-CM | POA: Diagnosis not present

## 2018-03-14 DIAGNOSIS — Z833 Family history of diabetes mellitus: Secondary | ICD-10-CM | POA: Diagnosis not present

## 2018-03-14 DIAGNOSIS — I4891 Unspecified atrial fibrillation: Secondary | ICD-10-CM | POA: Diagnosis not present

## 2018-03-14 DIAGNOSIS — E739 Lactose intolerance, unspecified: Secondary | ICD-10-CM | POA: Diagnosis not present

## 2018-03-14 DIAGNOSIS — F17210 Nicotine dependence, cigarettes, uncomplicated: Secondary | ICD-10-CM | POA: Diagnosis present

## 2018-03-14 DIAGNOSIS — R0602 Shortness of breath: Secondary | ICD-10-CM

## 2018-03-14 DIAGNOSIS — Z87891 Personal history of nicotine dependence: Secondary | ICD-10-CM | POA: Diagnosis not present

## 2018-03-14 DIAGNOSIS — R0689 Other abnormalities of breathing: Secondary | ICD-10-CM | POA: Diagnosis not present

## 2018-03-14 DIAGNOSIS — I50813 Acute on chronic right heart failure: Secondary | ICD-10-CM | POA: Diagnosis not present

## 2018-03-14 DIAGNOSIS — R200 Anesthesia of skin: Secondary | ICD-10-CM | POA: Diagnosis not present

## 2018-03-14 DIAGNOSIS — Z7951 Long term (current) use of inhaled steroids: Secondary | ICD-10-CM

## 2018-03-14 DIAGNOSIS — F1410 Cocaine abuse, uncomplicated: Secondary | ICD-10-CM | POA: Diagnosis present

## 2018-03-14 DIAGNOSIS — I499 Cardiac arrhythmia, unspecified: Secondary | ICD-10-CM | POA: Diagnosis not present

## 2018-03-14 DIAGNOSIS — B182 Chronic viral hepatitis C: Secondary | ICD-10-CM | POA: Diagnosis present

## 2018-03-14 DIAGNOSIS — Z9181 History of falling: Secondary | ICD-10-CM | POA: Diagnosis not present

## 2018-03-14 DIAGNOSIS — J948 Other specified pleural conditions: Secondary | ICD-10-CM | POA: Diagnosis not present

## 2018-03-14 HISTORY — DX: Pleural effusion, not elsewhere classified: J90

## 2018-03-14 LAB — BASIC METABOLIC PANEL
ANION GAP: 9 (ref 5–15)
Anion gap: 13 (ref 5–15)
BUN: 14 mg/dL (ref 6–20)
BUN: 13 mg/dL (ref 6–20)
CHLORIDE: 86 mmol/L — AB (ref 98–111)
CO2: 39 mmol/L — ABNORMAL HIGH (ref 22–32)
CO2: 37 mmol/L — ABNORMAL HIGH (ref 22–32)
Calcium: 8.9 mg/dL (ref 8.9–10.3)
Calcium: 8.7 mg/dL — ABNORMAL LOW (ref 8.9–10.3)
Chloride: 85 mmol/L — ABNORMAL LOW (ref 98–111)
Creatinine, Ser: 0.83 mg/dL (ref 0.61–1.24)
Creatinine, Ser: 0.82 mg/dL (ref 0.61–1.24)
GFR calc Af Amer: >60 mL/min (ref >60)
GFR calc non Af Amer: >60 mL/min (ref >60)
GFR calc non Af Amer: >60 mL/min (ref >60)
GLUCOSE: 118 mg/dL — AB (ref 70–99)
Glucose, Bld: 92 mg/dL (ref 70–99)
POTASSIUM: 5 mmol/L (ref 3.5–5.1)
Potassium: 4.4 mmol/L (ref 3.5–5.1)
SODIUM: 134 mmol/L — AB (ref 135–145)
Sodium: 135 mmol/L (ref 135–145)

## 2018-03-14 LAB — CBC WITH DIFFERENTIAL/PLATELET
ABS IMMATURE GRANULOCYTES: 0.02 10*3/uL (ref 0.00–0.07)
BASOS ABS: 0 10*3/uL (ref 0.0–0.1)
Basophils Relative: 1 %
Eosinophils Absolute: 0.1 10*3/uL (ref 0.0–0.5)
Eosinophils Relative: 1 %
HEMATOCRIT: 41.2 % (ref 39.0–52.0)
HEMOGLOBIN: 12.4 g/dL — AB (ref 13.0–17.0)
IMMATURE GRANULOCYTES: 0 %
LYMPHS ABS: 1.1 10*3/uL (ref 0.7–4.0)
LYMPHS PCT: 17 %
MCH: 28.8 pg (ref 26.0–34.0)
MCHC: 30.1 g/dL (ref 30.0–36.0)
MCV: 95.6 fL (ref 80.0–100.0)
Monocytes Absolute: 1.1 10*3/uL — ABNORMAL HIGH (ref 0.1–1.0)
Monocytes Relative: 17 %
NEUTROS ABS: 4.1 10*3/uL (ref 1.7–7.7)
NRBC: 0 % (ref 0.0–0.2)
Neutrophils Relative %: 64 %
Platelets: 185 10*3/uL (ref 150–400)
RBC: 4.31 MIL/uL (ref 4.22–5.81)
RDW: 15.9 % — ABNORMAL HIGH (ref 11.5–15.5)
WBC: 6.4 10*3/uL (ref 4.0–10.5)

## 2018-03-14 LAB — I-STAT TROPONIN, ED: TROPONIN I, POC: 0.01 ng/mL (ref 0.00–0.08)

## 2018-03-14 LAB — BRAIN NATRIURETIC PEPTIDE: B NATRIURETIC PEPTIDE 5: 244.9 pg/mL — AB (ref 0.0–100.0)

## 2018-03-14 MED ORDER — NICOTINE 14 MG/24HR TD PT24
14.0000 mg | MEDICATED_PATCH | Freq: Every day | TRANSDERMAL | Status: DC
  Administered 2018-03-18: 14 mg via TRANSDERMAL
  Filled 2018-03-14 (×5): qty 1

## 2018-03-14 MED ORDER — SODIUM CHLORIDE 0.9 % IV SOLN: 250.0000 mL | INTRAVENOUS | Status: DC | PRN

## 2018-03-14 MED ORDER — LORATADINE 10 MG PO TABS: 10.0000 mg | ORAL_TABLET | Freq: Every day | ORAL | Status: DC

## 2018-03-14 MED ORDER — SODIUM CHLORIDE 0.9% FLUSH: 3.0000 mL | INTRAVENOUS | Status: DC | PRN

## 2018-03-14 MED ORDER — NICOTINE 14 MG/24HR TD PT24: 14.0000 mg | MEDICATED_PATCH | Freq: Every day | TRANSDERMAL | Status: DC

## 2018-03-14 MED ORDER — FLUTICASONE PROPIONATE 50 MCG/ACT NA SUSP
1.0000 | Freq: Every day | NASAL | Status: DC
  Filled 2018-03-14: qty 16

## 2018-03-14 MED ORDER — POTASSIUM CHLORIDE CRYS ER 10 MEQ PO TBCR: 30.0000 meq | EXTENDED_RELEASE_TABLET | Freq: Two times a day (BID) | ORAL | Status: DC

## 2018-03-14 MED ORDER — FUROSEMIDE 10 MG/ML IJ SOLN
80.0000 mg | Freq: Three times a day (TID) | INTRAMUSCULAR | Status: DC
  Administered 2018-03-14 – 2018-03-16 (×6): 80 mg via INTRAVENOUS
  Filled 2018-03-14 (×6): qty 8

## 2018-03-14 MED ORDER — FLUTICASONE PROPIONATE 50 MCG/ACT NA SUSP
1.0000 | Freq: Every day | NASAL | Status: DC
  Administered 2018-03-14 – 2018-03-22 (×9): 1 via NASAL
  Filled 2018-03-14: qty 16

## 2018-03-14 MED ORDER — LORATADINE 10 MG PO TABS
10.0000 mg | ORAL_TABLET | Freq: Every day | ORAL | Status: DC
  Administered 2018-03-14 – 2018-03-22 (×9): 10 mg via ORAL
  Filled 2018-03-14 (×9): qty 1

## 2018-03-14 MED ORDER — SODIUM CHLORIDE 0.9% FLUSH: 3.0000 mL | Freq: Two times a day (BID) | INTRAVENOUS | Status: DC

## 2018-03-14 MED ORDER — RIVAROXABAN 20 MG PO TABS
20.0000 mg | ORAL_TABLET | Freq: Every day | ORAL | Status: DC
  Administered 2018-03-14 – 2018-03-21 (×9): 20 mg via ORAL
  Filled 2018-03-14 (×10): qty 1

## 2018-03-14 MED ORDER — FUROSEMIDE 10 MG/ML IJ SOLN
80.0000 mg | Freq: Once | INTRAMUSCULAR | Status: AC
  Administered 2018-03-14: 80 mg via INTRAVENOUS
  Filled 2018-03-14: qty 8

## 2018-03-14 MED ORDER — ACETAMINOPHEN 650 MG RE SUPP: 650.0000 mg | Freq: Four times a day (QID) | RECTAL | Status: DC | PRN

## 2018-03-14 MED ORDER — DILTIAZEM HCL ER COATED BEADS 240 MG PO CP24
480.0000 mg | ORAL_CAPSULE | Freq: Every day | ORAL | Status: DC
  Administered 2018-03-14 – 2018-03-22 (×9): 480 mg via ORAL
  Filled 2018-03-14 (×9): qty 2

## 2018-03-14 MED ORDER — SENNOSIDES-DOCUSATE SODIUM 8.6-50 MG PO TABS: 1.0000 | ORAL_TABLET | Freq: Every evening | ORAL | Status: DC | PRN

## 2018-03-14 MED ORDER — FUROSEMIDE 10 MG/ML IJ SOLN: 80.0000 mg | Freq: Three times a day (TID) | INTRAMUSCULAR | Status: DC

## 2018-03-14 MED ORDER — FUROSEMIDE 10 MG/ML IJ SOLN: 80.0000 mg | Freq: Once | INTRAMUSCULAR | Status: DC

## 2018-03-14 MED ORDER — ACETAMINOPHEN 325 MG PO TABS: 650.0000 mg | ORAL_TABLET | Freq: Four times a day (QID) | ORAL | Status: DC | PRN

## 2018-03-14 MED ORDER — DILTIAZEM HCL ER COATED BEADS 240 MG PO CP24
480.0000 mg | ORAL_CAPSULE | Freq: Every day | ORAL | Status: DC
  Filled 2018-03-14: qty 2

## 2018-03-14 MED ORDER — POTASSIUM CHLORIDE CRYS ER 20 MEQ PO TBCR
30.0000 meq | EXTENDED_RELEASE_TABLET | Freq: Two times a day (BID) | ORAL | Status: DC
  Administered 2018-03-14 – 2018-03-22 (×16): 30 meq via ORAL
  Filled 2018-03-14 (×17): qty 1

## 2018-03-14 NOTE — ED Notes (Signed)
Patient was given a Ginger Ale.

## 2018-03-14 NOTE — Discharge Instructions (Addendum)
Torsemide tablets °What is this medicine? °TORSEMIDE (TORE se mide) is a diuretic. It helps you make more urine and to lose salt and excess water from your body. This medicine is used to treat high blood pressure, and edema or swelling from heart, kidney, or liver disease. °This medicine may be used for other purposes; ask your health care provider or pharmacist if you have questions. °COMMON BRAND NAME(S): Demadex °What should I tell my health care provider before I take this medicine? °They need to know if you have any of these conditions: °-abnormal blood electrolytes °-diabetes °-gout °-heart disease °-kidney disease °-liver disease °-small amounts of urine, or difficulty passing urine °-an unusual or allergic reaction to torsemide, sulfa drugs, other medicines, foods, dyes, or preservatives °-pregnant or trying to get pregnant °-breast-feeding °How should I use this medicine? °Take this medicine by mouth with a glass of water. Follow the directions on the prescription label. You may take this medicine with or without food. If it upsets your stomach, take it with food or milk. Diem Dicocco not take your medicine more often than directed. Remember that you will need to pass more urine after taking this medicine. Albaraa Swingle not take your medicine at a time of day that will cause you problems. Marea Reasner not take at bedtime. °Talk to your pediatrician regarding the use of this medicine in children. Special care may be needed. °Overdosage: If you think you have taken too much of this medicine contact a poison control center or emergency room at once. °NOTE: This medicine is only for you. Jeiry Birnbaum not share this medicine with others. °What if I miss a dose? °If you miss a dose, take it as soon as you can. If it is almost time for your next dose, take only that dose. Altonio Schwertner not take double or extra doses. °What may interact with this medicine? °-alcohol °-certain antibiotics given by injection °certain heart medicines like  digoxin °-diuretics °-lithium °-medicines for diabetes °-medicines for blood pressure °-medicines for cholesterol like cholestyramine °-medicines that relax muscles for surgery °-NSAIDs, medicines for pain and inflammation, like ibuprofen or naproxen °-OTC supplements like ginseng and ephedra °-probenecid °-steroid medicines like prednisone or cortisone °This list may not describe all possible interactions. Give your health care provider a list of all the medicines, herbs, non-prescription drugs, or dietary supplements you use. Also tell them if you smoke, drink alcohol, or use illegal drugs. Some items may interact with your medicine. °What should I watch for while using this medicine? °Visit your doctor or health care professional for regular checks on your progress. Check your blood pressure regularly. Ask your doctor or health care professional what your blood pressure should be, and when you should contact him or her. If you are a diabetic, check your blood sugar as directed. °You may need to be on a special diet while taking this medicine. Check with your doctor. Also, ask how many glasses of fluid you need to drink a day. You must not get dehydrated. °You may get drowsy or dizzy. Malva Diesing not drive, use machinery, or Meyer Arora anything that needs mental alertness until you know how this drug affects you. Krista Godsil not stand or sit up quickly, especially if you are an older patient. This reduces the risk of dizzy or fainting spells. Alcohol can make you more drowsy and dizzy. Avoid alcoholic drinks. °What side effects may I notice from receiving this medicine? °Side effects that you should report to your doctor or health care professional as soon as possible: °-  allergic reactions such as skin rash or itching, hives, swelling of the lips, mouth, tongue or throat -blood in urine or stool -dry mouth -hearing loss or ringing in the ears -irregular heartbeat -muscle pain, weakness or cramps -pain or difficulty passing  urine -unusually weak or tired -vomiting or diarrhea Side effects that usually Landra Howze not require medical attention (report to your doctor or health care professional if they continue or are bothersome): -dizzy or lightheaded -headache -increased thirst -passing large amounts of urine -sexual difficulties -stomach pain, upset or nausea This list may not describe all possible side effects. Call your doctor for medical advice about side effects. You may report side effects to FDA at 1-800-FDA-1088. Where should I keep my medicine? Keep out of the reach of children. Store at room temperature between 15 and 30 degrees C (59 and 86 degrees F). Throw away any unused medicine after the expiration date. NOTE: This sheet is a summary. It may not cover all possible information. If you have questions about this medicine, talk to your doctor, pharmacist, or health care provider.  2019 Elsevier/Gold Standard (2007-11-19 11:35:45) Information on my medicine - XARELTO (Rivaroxaban)  This medication education was reviewed with me or my healthcare representative as part of my discharge preparation.   Why was Xarelto prescribed for you? Xarelto was prescribed for you to reduce the risk of a blood clot forming that can cause a stroke if you have a medical condition called atrial fibrillation (a type of irregular heartbeat).  What Rahkeem Senft you need to know about xarelto ? Take your Xarelto ONCE DAILY at the same time every day with your evening meal. If you have difficulty swallowing the tablet whole, you may crush it and mix in applesauce just prior to taking your dose.  Take Xarelto exactly as prescribed by your doctor and Kiffany Schelling NOT stop taking Xarelto without talking to the doctor who prescribed the medication.  Stopping without other stroke prevention medication to take the place of Xarelto may increase your risk of developing a clot that causes a stroke.  Refill your prescription before you run out.  After  discharge, you should have regular check-up appointments with your healthcare provider that is prescribing your Xarelto.  In the future your dose may need to be changed if your kidney function or weight changes by a significant amount.  What Chimamanda Siegfried you Denae Zulueta if you miss a dose? If you are taking Xarelto ONCE DAILY and you miss a dose, take it as soon as you remember on the same day then continue your regularly scheduled once daily regimen the next day. Natalyah Cummiskey not take two doses of Xarelto at the same time or on the same day.   Important Safety Information A possible side effect of Xarelto is bleeding. You should call your healthcare provider right away if you experience any of the following: ? Bleeding from an injury or your nose that does not stop. ? Unusual colored urine (red or dark brown) or unusual colored stools (red or black). ? Unusual bruising for unknown reasons. ? A serious fall or if you hit your head (even if there is no bleeding).  Some medicines may interact with Xarelto and might increase your risk of bleeding while on Xarelto. To help avoid this, consult your healthcare provider or pharmacist prior to using any new prescription or non-prescription medications, including herbals, vitamins, non-steroidal anti-inflammatory drugs (NSAIDs) and supplements.  This website has more information on Xarelto: [email or web address]

## 2018-03-14 NOTE — Progress Notes (Signed)
Declined CPAP at this time, no distress noted.  

## 2018-03-14 NOTE — ED Provider Notes (Signed)
[facility] EMERGENCY DEPARTMENT Provider Note   CSN: [id number] Arrival date & time: 03/14/18  4944     History   Chief Complaint Chief Complaint  Patient presents with  . Shortness of Breath    CHF    HPI Frank Morales is a 57 y.o. male.  HPI  56 year old male presents with dyspnea.  Has been ongoing for a couple days.  Significantly worse tonight.  He wears oxygen as needed.  EMS reports he has been tachycardic with A. fib, heart rate ranging between 90 and 140.  O2 sats in the 90s.  Hypertensive as well.  The patient states he has gained a lot of fluid weight over the holidays.  He thinks he is 30 pounds or more over his baseline. Some cough, no fevers. No prior history of COPD or asthma. No chest pain. There is some discomfort in his legs and abdomen where he is swollen.  Past Medical History:  Diagnosis Date  . Atrial fibrillation and flutter ([place]) 08/17/2015   A. S/p failed DCCV // b. Severe BAE on echo >> rate control strategy (has seen AF clinic) // c. Xarelto for anticoag (CHADS2-VASc=2 / CHF, HTN)  . Chronic diastolic CHF (congestive heart failure) ([place]) 08/30/2015   A. Echo 6/17: Apical HK, moderate focal basal and mild concentric LVH, EF 50-55, diffuse HK, trivial MR, severe BAE, mild TR, PASP 37  . Hepatitis C, chronic ([place]) 08/19/2015  . History of cardiac catheterization    a. LHC 6/17: LAD irregs, o/w no CAD  . OSA (obstructive sleep apnea) 11/21/2015  . Sleep apnea   . Tobacco abuse     Patient Active Problem List   Diagnosis Date Noted  . Acute on chronic heart failure ([place]) 03/14/2018  . CHF (congestive heart failure) ([place]) 01/22/2018  . Hyposmia 12/22/2017  . Tobacco abuse counseling   . Chronic atrial fibrillation   . Acute on chronic combined systolic and diastolic congestive heart failure ([place]) 07/03/2017  . Sinus congestion 03/03/2017  . Chronic dental pain 08/19/2016  . Rectal bleeding   . Grade I internal hemorrhoids   .  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 Contact dermatitis due to poison ivy 02/26/2016  . OSA (obstructive sleep apnea) 11/21/2015  . Acute on chronic diastolic CHF (congestive heart failure) ([place]) 08/30/2015  . Hepatitis C, chronic ([place]) 08/19/2015  . Morbid obesity ([place]) 08/17/2015  . Hypertension 08/17/2015  . SOB (shortness of breath) 08/17/2015  . Bilateral leg edema 08/17/2015  . Atrial fibrillation and flutter ([place]) 08/17/2015  . Current smoker 08/17/2015    Past Surgical History:  Procedure Laterality Date  . CARDIAC CATHETERIZATION N/A 08/21/2015   Procedure: Right/Left Heart Cath and Coronary Angiography;  Surgeon: Leonie Man, MD;  Location: [facility] LAB;  Service: Cardiovascular;  Laterality: N/A;  . CARDIOVERSION N/A 09/22/2015   Procedure: CARDIOVERSION;  Surgeon: Josue Hector, MD;  Location: [facility] ENDOSCOPY;  Service: Cardiovascular;  Laterality: N/A;  . COLONOSCOPY N/A 07/19/2016   Procedure: COLONOSCOPY;  Surgeon: Doran Stabler, MD;  Location: [facility] ENDOSCOPY;  Service: Gastroenterology;  Laterality: N/A;  . ESOPHAGOGASTRODUODENOSCOPY N/A 07/19/2016   Procedure: ESOPHAGOGASTRODUODENOSCOPY (EGD);  Surgeon: Doran Stabler, MD;  Location: [facility];  Service: Gastroenterology;  Laterality: N/A;  . NO PAST SURGERIES          Home Medications    Prior to Admission medications   Medication Sig Start Date End Date Taking? Authorizing Provider  diltiazem (CARDIZEM  CD) 240 MG 24 hr capsule Take 2 capsules (480 mg total) by mouth daily. 02/03/18  Yes Helberg, Larkin Ina, MD  fexofenadine (ALLEGRA ALLERGY) 60 MG tablet Take 1 tablet (60 mg total) by mouth 2 (two) times daily. 12/22/17  Yes [name], MD  fluticasone (FLONASE) 50 MCG/ACT nasal spray Place 1 spray into both nostrils daily. 04/10/17  Yes NedrudLarena Glassman, MD  furosemide (LASIX) 80 MG tablet Take one  80 mg tablet by mouth   twice daily. Patient taking differently: Take 80 mg by mouth 2 (two) times daily.   12/22/17  Yes [name], MD  loratadine (CLARITIN) 10 MG tablet Take 1 tablet (10 mg total) by mouth daily. 09/12/17  Yes Fay Records, MD  potassium chloride (K-DUR) 10 MEQ tablet Take 3 (10 mEq) tablets by mouth 2 times a day Patient taking differently: Take 30 mEq by mouth 2 (two) times daily.  11/05/17  Yes Evans Lance, MD  rivaroxaban (XARELTO) 20 MG TABS tablet Take 1 tablet (20 mg total) by mouth daily with supper. 12/22/17  Yes [name], MD  azelastine (ASTELIN) 0.1 % nasal spray Place 2 sprays into both nostrils 2 (two) times daily. Use in each nostril as directed up to twice a day. Patient not taking: Reported on 01/22/2018 12/22/17   [name], MD  nicotine (NICODERM CQ - DOSED IN MG/24 HOURS) 14 mg/24hr patch Place 1 patch (14 mg total) onto the skin daily. Patient not taking: Reported on 01/22/2018 07/10/17   Hosie Poisson, MD    Family History Family History  Problem Relation Age of Onset  . Hypertension Mother   . Diabetes Mother   . Pneumonia Father   . Hypertension Maternal Grandfather   . Colon cancer Neg Hx     Social History Social History   Tobacco Use  . Smoking status: Current Every Day Smoker    Packs/day: 0.20    Types: Cigarettes  . Smokeless tobacco: Never Used  . Tobacco comment: down to 1-2 cig/day with NRT  Substance Use Topics  . Alcohol use: Yes    Alcohol/week: 5.0 standard drinks    Types: 5 Standard drinks or equivalent per week    Comment: 2 times a week beer  . Drug use: No     Allergies   Lactose intolerance (gi)   Review of Systems Review of Systems  Constitutional: Negative for fever.  Respiratory: Positive for cough and shortness of breath.   Cardiovascular: Positive for leg swelling. Negative for chest pain.  Gastrointestinal: Positive for abdominal distention and abdominal pain.  All other systems reviewed and are negative.    Physical Exam Updated Vital Signs BP 119/80   Pulse (!) 115   Temp  98.2 F (36.8 C) (Oral)   Resp 20   SpO2 96%   Physical Exam Vitals signs and nursing note reviewed.  Constitutional:      Appearance: He is well-developed. He is obese. He is diaphoretic.  HENT:     Head: Normocephalic and atraumatic.     Right Ear: External ear normal.     Left Ear: External ear normal.     Nose: Nose normal.  Eyes:     General:        Right eye: No discharge.        Left eye: No discharge.  Neck:     Musculoskeletal: Neck supple.  Cardiovascular:     Rate and Rhythm: Normal rate and regular rhythm.  Heart sounds: Normal heart sounds.  Pulmonary:     Effort: Tachypnea and accessory muscle usage present. No respiratory distress.     Breath sounds: Decreased breath sounds, wheezing and rales present.  Abdominal:     Palpations: Abdomen is soft.     Tenderness: There is no abdominal tenderness.     Comments: Distended abdomen with edema  Musculoskeletal:     Right lower leg: Edema present.     Left lower leg: Edema present.  Skin:    General: Skin is warm.  Neurological:     Mental Status: He is alert.  Psychiatric:        Mood and Affect: Mood is not anxious.      ED Treatments / Results  Labs (all labs ordered are listed, but only abnormal results are displayed) Labs Reviewed  CBC WITH DIFFERENTIAL/PLATELET - Abnormal; Notable for the following components:      Result Value   Hemoglobin 12.4 (*)    RDW 15.9 (*)    Monocytes Absolute 1.1 (*)    All other components within normal limits  BASIC METABOLIC PANEL - Abnormal; Notable for the following components:   Sodium 134 (*)    Chloride 86 (*)    CO2 39 (*)    All other components within normal limits  BRAIN NATRIURETIC PEPTIDE - Abnormal; Notable for the following components:   B Natriuretic Peptide 244.9 (*)    All other components within normal limits  BASIC METABOLIC PANEL - Abnormal; Notable for the following components:   Chloride 85 (*)    CO2 37 (*)    Glucose, Bld 118 (*)     Calcium 8.7 (*)    All other components within normal limits  I-STAT TROPONIN, ED    EKG EKG Interpretation  Date/Time:  Saturday March 14 2018 00:44:06 EST Ventricular Rate:  111 PR Interval:    QRS Duration: 107 QT Interval:  329 QTC Calculation: 447 R Axis:   173 Text Interpretation:  Atrial fibrillation Right axis deviation Borderline low voltage, extremity leads Abnormal R-wave progression, late transition no significant change since Nov 2019 Confirmed by Sherwood Gambler [phone]) on 03/14/2018 12:50:43 AM   Radiology Dg Chest Portable 1 View  Result Date: 03/14/2018 CLINICAL DATA:  Acute onset of worsening shortness of breath. EXAM: PORTABLE CHEST 1 VIEW COMPARISON:  Chest radiograph performed 01/25/2018 FINDINGS: A small to moderate right-sided pleural effusion is again noted. Right basilar airspace opacification may reflect asymmetric pulmonary edema or pneumonia. Underlying vascular congestion is noted. No pneumothorax is seen. The cardiomediastinal silhouette is enlarged. No acute osseous abnormalities are seen. IMPRESSION: Small to moderate right-sided pleural effusion again noted. Right basilar airspace opacification may reflect asymmetric pulmonary edema or pneumonia. Underlying vascular congestion and cardiomegaly noted. Electronically Signed   By: Garald Balding M.D.   On: 03/14/2018 01:11    Procedures Procedures (including critical care time)  Medications Ordered in ED Medications  diltiazem (CARDIZEM CD) 24 hr capsule 480 mg (has no administration in time range)  rivaroxaban (XARELTO) tablet 20 mg (20 mg Oral Given 03/14/18 0513)  fluticasone (FLONASE) 50 MCG/ACT nasal spray 1 spray (has no administration in time range)  loratadine (CLARITIN) tablet 10 mg (has no administration in time range)  furosemide (LASIX) injection 80 mg (80 mg Intravenous Not Given 03/14/18 0418)  sodium chloride flush (NS) 0.9 % injection 3 mL (has no administration in time range)    sodium chloride flush (NS) 0.9 % injection 3 mL (has no  administration in time range)  0.9 %  sodium chloride infusion (has no administration in time range)  acetaminophen (TYLENOL) tablet 650 mg (has no administration in time range)    Or  acetaminophen (TYLENOL) suppository 650 mg (has no administration in time range)  senna-docusate (Senokot-S) tablet 1 tablet (has no administration in time range)  potassium chloride (K-DUR,KLOR-CON) CR tablet 30 mEq (has no administration in time range)  nicotine (NICODERM CQ - dosed in mg/24 hours) patch 14 mg (has no administration in time range)  furosemide (LASIX) injection 80 mg (80 mg Intravenous Given 03/14/18 0246)     Initial Impression / Assessment and Plan / ED Course  I have reviewed the triage vital signs and the nursing notes.  Pertinent labs & imaging results that were available during my care of the patient were reviewed by me and considered in my medical decision making (see chart for details).  Clinical Course as of Mar 14 749  Sat Mar 14, 2018  0055 Patient is working to breathe but that significantly improved after oxygen placed (O2 sats 81% on room air). He would like to avoid bipap (doesn't like the mask). The WOB/accessory muscle use is better, so he can probably avoid for now   [SG]    Clinical Course User Index [SG] Sherwood Gambler, MD    Patient presents with CHF exacerbation.  He is feeling much better on the nasal cannula oxygen and does not appear to need BiPAP or intubation.  He appears stable for a floor admission.  Likely this is related to poor diet control around the holidays.  He will be given IV Lasix.  Internal medicine teaching service to admit.  Final Clinical Impressions(s) / ED Diagnoses   Final diagnoses:  Acute on chronic congestive heart failure, unspecified heart failure type [facility])    ED Discharge Orders    None       Sherwood Gambler, MD 03/14/18 [phone]

## 2018-03-14 NOTE — ED Notes (Signed)
Report attempted 

## 2018-03-14 NOTE — ED Triage Notes (Signed)
Patient arrived with EMS from home reports worsening SOB with productive cough and bilateral lower legs edema / abdominal edema for 3 days history of CHF , denies chest pain .

## 2018-03-14 NOTE — ED Notes (Signed)
Pt given recliner for comfort

## 2018-03-14 NOTE — ED Notes (Signed)
Breakfast ordered heart healthy, Frank Morales

## 2018-03-14 NOTE — H&P (Addendum)
Date: 03/14/2018               Patient Name:  Frank Morales MRN: [id number]  DOB: Jul 26, 1960 Age / Sex: 57 y.o., male   PCP: [name], MD         Medical Service: Internal Medicine Teaching Service         Attending Physician: Dr. Evette Doffing, Mallie Mussel, *    First Contact: Dr. Truman Hayward Pager: [phone]  Second Contact: Dr. Tarri Abernethy Pager: [phone]       After Hours (After 5p/  First Contact Pager: [phone]  weekends / holidays): Second Contact Pager: [phone]   Chief Complaint: Shortness of breath  History of Present Illness: 57 year old male with past medical history of morbid obesity, HFpEF, atrial fibrillation/flutter (on Xarelto), HTN, OSA, history of tobacco use and alcohol use and chronic pansinusitis, presented due to worsening of shorthness of breath. Frank Morales mentions having 3 days history of worsening shortness of breath and increased O2 requirement at home. (He was on 2 Li nasal O2 as needed at home, that increased to 3 Li in past few days). Since past week, he noticed more swelling on his legs as well as swollen abdomen.  He last time, weighted himself a week ago which was 370 lb, but he feels he gained about 30 lbs over holidays. He has chronic orthopnea and sleeps in sitting position. He reports compliance to his medications (including Po Lasix 80 mg BID and potassium) but not to the low sodium diet during holiday. He was hospitalized from 11/7 to 11/18  for acute on chronic CHF exacerbation with discharge weight  of 351 lb.  On the night of admission, his shortness of breath got significantly worse, and he called EMS. He denies any chest pain. No nausea vomiting, abdominal pain or diarrhea but endorses some constipation in the past 2 days. He smokes cigarettes and has some cough at baseline, without any recent worsening. He is on Xarelto and reports having nose bleeding sometimes but thinks it might be due to dry nasal mucosa, using nasal spray recently.  ED  course: On arrival, he was tachypneic, tachycardic at 110s with A-Fib. Saturated at 90s.  He refused BIPAP in ED. Tachypnea improved with nasal O2. Had volume overload on exam with vascular congestion and some pleural effusion on CXR. Was given 80 mg IV Lasix and IM consulted for admission.   and  Meds:  Current Meds  Medication Sig  . diltiazem (CARDIZEM CD) 240 MG 24 hr capsule Take 2 capsules (480 mg total) by mouth daily.  . fexofenadine (ALLEGRA ALLERGY) 60 MG tablet Take 1 tablet (60 mg total) by mouth 2 (two) times daily.  . fluticasone (FLONASE) 50 MCG/ACT nasal spray Place 1 spray into both nostrils daily.  . furosemide (LASIX) 80 MG tablet Take one  80 mg tablet by mouth   twice daily. (Patient taking differently: Take 80 mg by mouth 2 (two) times daily. )  . loratadine (CLARITIN) 10 MG tablet Take 1 tablet (10 mg total) by mouth daily.  . potassium chloride (K-DUR) 10 MEQ tablet Take 3 (10 mEq) tablets by mouth 2 times a day (Patient taking differently: Take 30 mEq by mouth 2 (two) times daily. )  . rivaroxaban (XARELTO) 20 MG TABS tablet Take 1 tablet (20 mg total) by mouth daily with supper.     Allergies: Allergies as of 03/14/2018 - Review Complete 03/14/2018  Allergen Reaction Noted  . Lactose intolerance (gi)  Nausea And Vomiting 07/16/2016   Past Medical History:  Diagnosis Date  . Atrial fibrillation and flutter ([place]) 08/17/2015   A. S/p failed DCCV // b. Severe BAE on echo >> rate control strategy (has seen AF clinic) // c. Xarelto for anticoag (CHADS2-VASc=2 / CHF, HTN)  . Chronic diastolic CHF (congestive heart failure) ([place]) 08/30/2015   A. Echo 6/17: Apical HK, moderate focal basal and mild concentric LVH, EF 50-55, diffuse HK, trivial MR, severe BAE, mild TR, PASP 37  . Hepatitis C, chronic ([place]) 08/19/2015  . History of cardiac catheterization    a. LHC 6/17: LAD irregs, o/w no CAD  . OSA (obstructive sleep apnea) 11/21/2015  . Sleep apnea   . Tobacco abuse      Family History:  HTN   Mother DM     Mother HTN Maternal grandmother  Social History:  Smokes 1-2 cigarettes daily (currently done last week) Drinks 2-3 beers per day Last crack use 1 month ago  Review of Systems: A complete ROS was negative except as per HPI. Physical Exam: Blood pressure 137/76, pulse (!) 112, temperature 98.5 F (36.9 C), temperature source Oral, resp. rate 19, SpO2 95 %. Physical Exam Constitutional:      Appearance: He is well-developed. He is obese.  HENT:     Head: Normocephalic and atraumatic.  Eyes:     Pupils: Pupils are equal, round, and reactive to light.  Neck:     Vascular: JVD present. No hepatojugular reflux.  Cardiovascular:     Rate and Rhythm: Tachycardia present. Rhythm irregular.     Heart sounds: No murmur.  Pulmonary:     Effort: Tachypnea present.     Breath sounds: Examination of the right-middle field reveals rales. Examination of the left-middle field reveals rales. Examination of the right-lower field reveals decreased breath sounds and rales. Examination of the left-lower field reveals rales. Decreased breath sounds and rales present.  Abdominal:     General: Bowel sounds are normal. There is distension.  Musculoskeletal:     Right lower leg: Edema present.     Left lower leg: Edema present.  Neurological:     General: No focal deficit present.     Mental Status: He is alert and oriented to person, place, and time.  Psychiatric:        Mood and Affect: Mood normal.        Behavior: Behavior normal.     BMP Latest Ref Rng & Units 03/14/2018 02/02/2018 02/01/2018  Glucose 70 - 99 mg/dL 92 103(H) 129(H)  BUN 6 - 20 mg/dL 14 9 8   Creatinine 0.61 - 1.24 mg/dL 0.83 0.83 0.89  BUN/Creat Ratio 9 - 20 - - -  Sodium 135 - 145 mmol/L 134(L) 135 135  Potassium 3.5 - 5.1 mmol/L 5.0 4.3 3.6  Chloride 98 - 111 mmol/L 86(L) 93(L) 91(L)  CO2 22 - 32 mmol/L 39(H) 37(H) 36(H)  Calcium 8.9 - 10.3 mg/dL 8.9 8.7(L) 8.9   BNP (last 3  results) Recent Labs    07/05/17 0434 01/22/18 1815 03/14/18 0049  BNP 194.8* 510.4* 244.9*   EKG: personally reviewed my interpretation is A fib, poor R progression CXR: personally reviewed my interpretation is pleural effusion   Assessment & Plan by Problem: Active Problems:   Acute on chronic heart failure ([place])  57 year old male with past medical history of morbid obesity, HFpEF, atrial fibrillation/flutter (on Xarelto), HTN, OSA, history of tobacco use and alcohol use and chronic pansinusitis, presented with 3  days history of worsening shortness of breath and increased O2 requirement at home lower extremity edema and abdominal swelling on his legs as well as swollen abdomen with about 20-30 lbs weight gain.  Acute on chronic heart failure: (Last echo 01/23/2018: EF 55-60%), last cardiology visit on Aug 2019. Patient with Hx of diastolic CHF, presented with worsening SOB and abdominal and lower extremities swelling, increased. O2 requirement at home. Looks volume overload on exam.  With small to moderate right-sided pleural effusion and underlying vascular congestion. on CXR. BNP: 224 (can be falsely low due to BMI.) I stat Trop negative.  Weight reported on last hospital discharge: 351 lb. Current weight pending (last weight 1 week ago at home: Likely 2/2 non adherence to diet.  Reports dherence to Lasix 80 mg BID and potassium chloride 10 mg TID. (Not on beta-blocker due to hx of cocaine abuse.)  Exam showed volume overload and lower extremities and abdomen, and lungs.  He was hospitalized from 11/7 to 11/18  for acute on chronic CHF exacerbation due to meds and diet non compliance. He missed his cardiology follow-up appointment with Dr. Harrington Challenger on December 9th. Received 80 mg IV Lasix at ED.   -IV Lasix 80 mg once and reevaluate -Strict intake and output -Weight daily -Cardiac monitoring -Restric fluid -Monitor BMP   A-fib: On Xarelto at home.  CHA2DS2-VASc Score 2 (CHF,  HTN)  denies palpitation more than baseline. Currently Afib with rate of 111. -Continue Xarelto 20 mg daily -Diltiazem 24-hour capsule, 480 mg daily -Cardiac monitoring   HTN: Stable.  -Continue home dose of Diltiazem   Current tobacco use and alcohol use: -Nicotine patch -CIWA without Ativan  OSA: -CPAP at night  Chronic pan sinusitis: Follows up with ENT outpatient. -Continue home meds loratadine 10 mg daily -Flonase nasal spray  Diet: Heart healthy, IV fluid: None VTE ppx: Xarelto Code status: Full Dispo: Admit patient to Inpatient with expected length of stay greater than 2 midnights.  SignedDewayne Hatch, MD 03/14/2018, 5:05 AM  Pager: [phone]

## 2018-03-14 NOTE — ED Notes (Signed)
Breakfast tray ordered 

## 2018-03-14 NOTE — Progress Notes (Signed)
Subjective:  Frank Morales is a 57 y.o. with PMH of morbid obesity, A.fib on Xarelto, HFpEF, HTN and OSA admit for dyspnea on hospital day 0  Frank Morales was examined and evaluated at bedside in the ED this a.m.  He states during the holidays he had minimal control over his diet. And he ate "whatever they brought me."  States he noticed increasing gain of about 30 pounds before he stopped weight himself.  He mentions worsening abdominal distention and edema as well as orthopnea prior to admission. Overnight he states after IV Lasix dose he breathing appeared to be improving. Denies any chest pain, palpitations, cough, nausea, vomiting, diarrhea, or constipation. Denis any dysuria, urgency, frequency.  Objective:  Vital signs in last 24 hours: Vitals:   03/14/18 0330 03/14/18 0400 03/14/18 0526 03/14/18 0530  BP:   108/75 123/87  Pulse: (!) 105 (!) 112 (!) 105 (!) 113  Resp: 20 19 20    Temp:   98.2 F (36.8 C)   TempSrc:   Oral   SpO2: 92% 95% 98% 99%   Physical Exam  Constitutional: He is oriented to person, place, and time and well-developed, well-nourished, and in no distress. No distress.  Morbidly obese  HENT:  Head: Normocephalic and atraumatic.  Mouth/Throat: Oropharynx is clear and moist.  Neck: Normal range of motion. Neck supple.  Wide neck  Cardiovascular: Normal rate, regular rhythm and intact distal pulses.  No murmur heard. Pulmonary/Chest: Effort normal. He has rales (bilateral rales up to mid thorax).  Abdominal: Bowel sounds are normal. He exhibits distension (tense distended abdomen with pitting edema up to mid abdomen).  Musculoskeletal: Normal range of motion.        General: Edema (3+ pitting edema tracking up to abdomen) present.  Neurological: He is alert and oriented to person, place, and time. No cranial nerve deficit. GCS score is 15.  Skin: Skin is warm and dry. He is not diaphoretic.  Lower extremity skin thickening without obvious venous stasis  ulcers   Assessment/Plan:  Active Problems:   Acute on chronic heart failure ([place])  He is well-known to the service for prior episodes of acute on chronic diastolic heart failure etc. Episodes.  Currently has significant signs of hypovolemia and may require prolonged inpatient stay to diuresis .  Inpatient stay last known dry weight at 160.6 kg. Also known right-sided pleural effusion noted on chest X-ray. Will need thoracentesis for work-up.  Acute on Chronic Diastolic vs R sided Heart Failure Last Echo 01/2018: EF 55-60%, Moderately reduced right ventricle ejection fraction, mild LVH wall thickening. Dry weight 161kg. Current weight 172kg. - C/w furosemide 80mg  TID - Daily post void residuals - Trend BMP - Mag level - Strict I&Os - Daily Weights - Fluid restriction - Keep O2 sat >88 - Replenish K as needed >4.0  R sided pleural effusion First noted pleural effusions on 06/2017. Increased right-sided pleural effusion on Chest X-ray on this admission. Most likely 2/2 CHF but he does have hx of tobacco use. - IR consult for diagnostic thoracentesis  Atrial Fibrillation HR 111 A.fib on EKG, diltiazem 480mg  daily at home, on xarelto for ac - C/w home meds: diltiazem 480mg  daily, xarelto 20mg  daily - Telemetry  OSA Not on CPAP at home due to discomfort. Willing to attempt during admission - CPAP during admission  DVT prophx: Xarelto Diet: Cardiac w/ fluid restriction Bowel: Senokot Code: Full  Dispo: Anticipated discharge in approximately 2-3 day(s).   [name], MD  03/14/2018, 6:31 AM Pager: [phone]

## 2018-03-15 ENCOUNTER — Inpatient Hospital Stay (HOSPITAL_COMMUNITY): Payer: Medicaid Other

## 2018-03-15 LAB — BASIC METABOLIC PANEL
ANION GAP: 10 (ref 5–15)
BUN: 12 mg/dL (ref 6–20)
CO2: 42 mmol/L — ABNORMAL HIGH (ref 22–32)
Calcium: 8.7 mg/dL — ABNORMAL LOW (ref 8.9–10.3)
Chloride: 84 mmol/L — ABNORMAL LOW (ref 98–111)
Creatinine, Ser: 0.67 mg/dL (ref 0.61–1.24)
GFR calc Af Amer: >60 mL/min (ref >60)
GFR calc non Af Amer: >60 mL/min (ref >60)
Glucose, Bld: 143 mg/dL — ABNORMAL HIGH (ref 70–99)
Potassium: 3.8 mmol/L (ref 3.5–5.1)
SODIUM: 136 mmol/L (ref 135–145)

## 2018-03-15 LAB — PROTEIN, TOTAL: Total Protein: 7.2 g/dL (ref 6.5–8.1)

## 2018-03-15 LAB — LACTATE DEHYDROGENASE: LDH: 153 U/L (ref 98–192)

## 2018-03-15 LAB — MAGNESIUM: Magnesium: 2.3 mg/dL (ref 1.7–2.4)

## 2018-03-15 MED ORDER — LIDOCAINE HCL (PF) 1 % IJ SOLN
INTRAMUSCULAR | Status: AC
  Filled 2018-03-15: qty 30
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
## 2018-03-15 NOTE — Procedures (Signed)
PROCEDURE SUMMARY:  Successful US guided right thoracentesis. Yielded 1.6 L of clear amber fluid. Pt tolerated procedure well. No immediate complications.  Specimen was obtained and available for labs if desired. CXR ordered.  EBL < 5 mL  Ascencion Dike PA-C 03/15/2018 9:53 AM

## 2018-03-15 NOTE — Discharge Summary (Signed)
Name: Frank Morales MRN: [id number] DOB: April 15, 1960 57 y.o. PCP: [name], MD  Date of Admission: 03/14/2018 12:39 AM Date of Discharge: 03/22/2018 Attending Physician: Bartholomew Crews, MD  Discharge Diagnosis: 1. Acute on chronic diastolic heart failure 2. Right sided pleural effusion  Discharge Medications: Allergies as of 03/22/2018      Reactions   Lactose Intolerance (gi) Nausea And Vomiting      Medication List    STOP taking these medications   furosemide 80 MG tablet Commonly known as:  LASIX     TAKE these medications   azelastine 0.1 % nasal spray Commonly known as:  ASTELIN Place 2 sprays into both nostrils 2 (two) times daily. Use in each nostril as directed up to twice a day.   diltiazem 240 MG 24 hr capsule Commonly known as:  CARDIZEM CD Take 2 capsules (480 mg total) by mouth daily.   fexofenadine 60 MG tablet Commonly known as:  ALLEGRA ALLERGY Take 1 tablet (60 mg total) by mouth 2 (two) times daily.   fluticasone 50 MCG/ACT nasal spray Commonly known as:  FLONASE Place 1 spray into both nostrils daily.   loratadine 10 MG tablet Commonly known as:  CLARITIN Take 1 tablet (10 mg total) by mouth daily.   nicotine 14 mg/24hr patch Commonly known as:  NICODERM CQ - dosed in mg/24 hours Place 1 patch (14 mg total) onto the skin daily.   potassium chloride 10 MEQ tablet Commonly known as:  K-DUR Take 3 (10 mEq) tablets by mouth 2 times a day What changed:    how much to take  how to take this  when to take this  additional instructions   rivaroxaban 20 MG Tabs tablet Commonly known as:  XARELTO Take 1 tablet (20 mg total) by mouth daily with supper.   torsemide 20 MG tablet Commonly known as:  DEMADEX Take 6 tablets (120 mg total) by mouth 2 (two) times daily.       Disposition and follow-up:   Mr.Ogden R Torosyan was discharged from [facility] in Stable condition.  At the hospital follow up  visit please address:  1.  Acute on chronic right sided heart failure:  - Check his volume status and titrate diuretic as appropriate - Reinforce importance of dietary adherence - Check bmp and replete potassium as needed  2.  Labs / imaging needed at time of follow-up: BMP  3.  Pending labs/ test needing follow-up: right pleural effusion cytology  Follow-up Appointments: Patient is to follow up with the [facility] clinic in one week.   Hospital Course by problem list: 1. Acute on chronic right sided heart failure: 57 yo M w/ PMH of morbid obesity, A.fib on Xarelto, HFpEF, HTN, and OSA presented with significant dyspnea, worsening orthopnea and distension. Found to be hypervolemic on exam and weight gain of ~10kg. Started on IV furosemide 80mg  TID. Increased to 100mg  TID when urinary output slowed down. Diuresed down to dry weight of 366, however, unreliable weights during hospital admission. Discharged with recommendation to f/u with cardiology and on home torsemide 120 mg bid. Also referred to the HF clinic.  2. Right sided pleural effusion: Found to have right sided pleural effusion with no prior work up. IR consulted for thoracentesis. No pneumothorax noted after thora. Pleural fluid at  borderline Light's criteria as well as serum-effusion albumin gradient for transudative vs exudative. Cytology pending.  Discharge Vitals:   BP (!) 155/100 (BP Location: Left  Wrist)   Pulse 93   Temp 99.2 F (37.3 C) (Oral)   Resp 18   Ht 5\' 11"  (1.803 m)   Wt (!) 166 kg Comment: weighed on two different scales  SpO2 91%   BMI 51.05 kg/m   Pertinent Labs, Studies, and Procedures:  PORTABLE CHEST 1 VIEW  COMPARISON:  Chest radiograph performed 01/25/2018  FINDINGS: A small to moderate right-sided pleural effusion is again noted. Right basilar airspace opacification may reflect asymmetric pulmonary edema or pneumonia. Underlying vascular congestion is noted. No pneumothorax is seen.  The  cardiomediastinal silhouette is enlarged. No acute osseous abnormalities are seen.  IMPRESSION: Small to moderate right-sided pleural effusion again noted. Right basilar airspace opacification may reflect asymmetric pulmonary edema or pneumonia. Underlying vascular congestion and cardiomegaly noted.  Discharge Instructions: Discharge Instructions    AMB referral to CHF clinic   Complete by:  As directed    Patient needs to be seen by the heart failure clinic. Thanks   Diet - low sodium heart healthy   Complete by:  As directed    Discharge instructions   Complete by:  As directed    Mr. Coluccio,  I want you to STOP taking lasix and START taking Torsemide 120 mg twice a day. Start taking this medication tonight. Take one dose of 120 mg tonight and then continue taking 120 mg twice a day starting tomorrow.   I also want you to closely follow up with the [facility] clinic this week. I will schedule you an appointment for the end of the week. I also want you to continue checking your weight at home. If you notice it starts increasing give the clinic a call. I am also going to refer you to the heart failure clinic to help prevent this from reoccurring. Continue taking all of your other medications as prescribed.  I also want to remind you to continue using your CPAP machine at home. It is really important you do so to prevent your heart failure from getting worse.  Thank you for allowing [place] to be a part of your care!   Increase activity slowly   Complete by:  As directed       Signed: Mike Craze, DO 03/22/2018, 12:22 PM   Pager: [phone]

## 2018-03-15 NOTE — Progress Notes (Signed)
Pt refuses CPAP for the night. No distress noted.

## 2018-03-16 DIAGNOSIS — R531 Weakness: Secondary | ICD-10-CM

## 2018-03-16 DIAGNOSIS — R200 Anesthesia of skin: Secondary | ICD-10-CM

## 2018-03-16 DIAGNOSIS — Z9181 History of falling: Secondary | ICD-10-CM

## 2018-03-16 DIAGNOSIS — I5030 Unspecified diastolic (congestive) heart failure: Secondary | ICD-10-CM

## 2018-03-16 DIAGNOSIS — Z87891 Personal history of nicotine dependence: Secondary | ICD-10-CM

## 2018-03-16 DIAGNOSIS — E873 Alkalosis: Secondary | ICD-10-CM

## 2018-03-16 DIAGNOSIS — I50813 Acute on chronic right heart failure: Secondary | ICD-10-CM

## 2018-03-16 LAB — BODY FLUID CELL COUNT WITH DIFFERENTIAL
Eos, Fluid: 0 %
Lymphs, Fluid: 91 %
Monocyte-Macrophage-Serous Fluid: 4 % — ABNORMAL LOW (ref 50–90)
Neutrophil Count, Fluid: 5 % (ref 0–25)
WBC FLUID: 1671 uL — AB (ref 0–1000)

## 2018-03-16 LAB — BASIC METABOLIC PANEL
Anion gap: 9 (ref 5–15)
BUN: 8 mg/dL (ref 6–20)
CO2: 42 mmol/L — ABNORMAL HIGH (ref 22–32)
CREATININE: 0.68 mg/dL (ref 0.61–1.24)
Calcium: 8.4 mg/dL — ABNORMAL LOW (ref 8.9–10.3)
Chloride: 83 mmol/L — ABNORMAL LOW (ref 98–111)
GFR calc Af Amer: >60 mL/min (ref >60)
GFR calc non Af Amer: >60 mL/min (ref >60)
Glucose, Bld: 124 mg/dL — ABNORMAL HIGH (ref 70–99)
Potassium: 3.9 mmol/L (ref 3.5–5.1)
Sodium: 134 mmol/L — ABNORMAL LOW (ref 135–145)

## 2018-03-16 LAB — PROTEIN, PLEURAL OR PERITONEAL FLUID: Total protein, fluid: 3.7 g/dL

## 2018-03-16 LAB — LACTATE DEHYDROGENASE, PLEURAL OR PERITONEAL FLUID: LD, Fluid: 95 U/L — ABNORMAL HIGH (ref 3–23)

## 2018-03-16 LAB — ALBUMIN, PLEURAL OR PERITONEAL FLUID: Albumin, Fluid: 2 g/dL

## 2018-03-16 LAB — MAGNESIUM: Magnesium: 2.2 mg/dL (ref 1.7–2.4)

## 2018-03-16 MED ORDER — FUROSEMIDE 10 MG/ML IJ SOLN
100.0000 mg | Freq: Three times a day (TID) | INTRAVENOUS | Status: DC
  Administered 2018-03-16 – 2018-03-22 (×18): 100 mg via INTRAVENOUS
  Filled 2018-03-16 (×19): qty 10

## 2018-03-16 NOTE — Progress Notes (Signed)
  RT Note:  Was able to convince patient to try CPAP.  Did not know settings so we titrated between a max of 16 and a minimum of 5.  Bled 5L of O2 to keep saturation numbers up.  Patient seems to be tolerating it, but patient did say that he did not like them.  Patient also told me he did not want the full mask, so I suggested the nasal mask in hopes that he would atleast give it a try.  Patient asked how long he needed to wear it, I told patient to wear as long as he can, that he will only benefit from it.  Brayton Layman, RN witnessed conversation.
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
## 2018-03-16 NOTE — Progress Notes (Signed)
  Date: 03/16/2018  Patient name: Frank Morales  Medical record number: [id number]  Date of birth: 08/11/1960   I have seen and evaluated this patient and I have discussed the plan of care with the house staff. Please see their note for complete details. I concur with their findings with the following additions/corrections: [name] was seen on morning team rounds.  He has moderate OSA with an AHI of 23 diagnosed in 2017.  I agree with Dr. Truman Hayward that he needs to use CPAP 20 cm H2O in the hospital.  He is still volume overloaded and we are increasing his furosemide.  Bartholomew Crews, MD 03/16/2018, 1:25 PM

## 2018-03-17 ENCOUNTER — Encounter (HOSPITAL_COMMUNITY): Payer: Self-pay

## 2018-03-17 ENCOUNTER — Other Ambulatory Visit: Payer: Self-pay

## 2018-03-17 LAB — BASIC METABOLIC PANEL
Anion gap: 11 (ref 5–15)
BUN: 9 mg/dL (ref 6–20)
CALCIUM: 8.6 mg/dL — AB (ref 8.9–10.3)
CO2: 42 mmol/L — ABNORMAL HIGH (ref 22–32)
Chloride: 84 mmol/L — ABNORMAL LOW (ref 98–111)
Creatinine, Ser: 0.78 mg/dL (ref 0.61–1.24)
GFR calc Af Amer: >60 mL/min (ref >60)
GFR calc non Af Amer: >60 mL/min (ref >60)
Glucose, Bld: 103 mg/dL — ABNORMAL HIGH (ref 70–99)
Potassium: 4 mmol/L (ref 3.5–5.1)
Sodium: 137 mmol/L (ref 135–145)

## 2018-03-17 LAB — MAGNESIUM: Magnesium: 2.1 mg/dL (ref 1.7–2.4)

## 2018-03-17 LAB — ALBUMIN: Albumin: 3.2 g/dL — ABNORMAL LOW (ref 3.5–5.0)

## 2018-03-17 NOTE — Progress Notes (Signed)
RT Note:  Patient said he was just finishing up on his Lasix, did not want to go on CPAP at this time.  RN stated that she would place patient on CPAP when patient was ready.

## 2018-03-17 NOTE — Progress Notes (Signed)
Subjective: Frank Morales is a 57 y.o. with PMH of morbid obesity, A.fib on Xarelto, HFpEF, HTN and OSA admit for acute on chronic right sided heart failure on hospital day 3.  [name] was examined and evaluated at bedside this AM. He endorses another night of poor sleep due to his orthopnea and osa.  He states he tried to tolerate his CPAP and he had no issues with the mask but he had difficulty moving around on the bed due to the short length of the tubing and lines. He states his respiratory status appear to be improving but wish he could get better sleep. Reconfirmed that he would attempt again tonight. Denies any chest pain, palpitations, cough, dyspnea.  Objective: Vital signs in last 24 hours: Vitals:   03/16/18 1931 03/16/18 2320 03/16/18 2330 03/17/18 0515  BP: 103/72   104/71  Pulse: 88 100  87  Resp:  18    Temp: (!) 97.4 F (36.3 C)   98.9 F (37.2 C)  TempSrc: Oral   Oral  SpO2: 92% 93% 100% 94%  Weight:    (!) 167.2 kg  Height:       Physical Exam  Constitutional: He is well-developed, well-nourished, and in no distress. No distress.  Morbidly obese  Eyes: Pupils are equal, round, and reactive to light. EOM are normal.  Injected conjunctivae  Neck: Normal range of motion. Neck supple.  Wide neck  Cardiovascular: Normal rate, regular rhythm, normal heart sounds and intact distal pulses.  No murmur heard. Pulmonary/Chest: Effort normal. He has rales (bilateral rales up to mid thorax).  Abdominal: Bowel sounds are normal. He exhibits distension (tense abdomen ). There is no abdominal tenderness. There is no guarding.  Musculoskeletal: Normal range of motion.        General: Edema (2+ pitting edema up to mid-lower abdomen) present.  Skin: Skin is warm and dry.  92mm superficial ulcer with oozing clear colored exudate noted on stomach.    Assessment/Plan:  Principal Problem:   Acute on chronic heart failure (HCC) Active Problems:   Morbid obesity (HCC)  Orthopnea   Atrial fibrillation and flutter (HCC)   OSA (obstructive sleep apnea)   Pleural effusion on right  Frank Morales is a 57 y.o. with PMH of morbid obesity, A.fib on Xarelto, HFpEF, HTN and OSA admit for acute on chronic right sided heart failure. His weight is starting to trend down again after increasing his lasix dose yesterday. Continuing to have poor adherence to CPAP use. Will continue trending weight loss and bmps.   Acute on Chronic Right Sided Heart Failure Net Output 6.2L yesterday. Weight change -1kg but may be inaccurate. No am weight this morning. - C/w furosemide 100mg  TID - Daily post void residuals - Strict I&Os - Daily Weights - Fluid restriction - Keep O2 sat >88 - C/w K-dur 71mEq BID - Replenish K as needed >0.6  Metabolic alkalosis 2/2 diuretic use Currently on iv furosemide 100mg  TID. Bicarb @ 42 for the last 3 days - C/w to encourage CPAP use - VBG this am - Trend BMP  Atrial Fibrillation Rate controlled. 80-100 on tele - C/w home meds: diltiazem 480mg  daily, xarelto 20mg  daily - Telemetry  OSA Poor adherence to CPAP use due to discomfort with tubing length - C/w to encourage CPAP use  R sided pleural effusion Drained by IR on 03/15/18, total protein 7.2, pleural protein 3.7, serum LDH 153, Fluid LDH 95. Exudative effusion per Light's Criteria (LDH ratio @  0.62) but it's borderline and it may be falsely positive due to concurrent diuretic use. Fluid albumin 2.0 Will f/u serum albumin for albumin gradient. - F/u cytology - Serum albumin  DVT prophx: Xarelto Diet:  Cardiac w/ fluid restriction Code: Full  Dispo: Anticipated discharge in approximately 3-4 day(s).   Frank Anis, MD, PGY1 03/17/2018, 6:33 AM  Pager: [phone]

## 2018-03-17 NOTE — Progress Notes (Signed)
Patient called me into his room to tell me he did not want his cpap nasal mask on anymore. He stated that he was "hooked up on too many lines" and  felt "crowded and claustrophobic". He stated that the cpap made him breathe even harder. RN educated patient about the significance of the cpap, pt refused. Notified respiratory. Placed pt back on 3.5L [place]. Will continue to monitor patient.

## 2018-03-17 NOTE — Progress Notes (Signed)
RT Note:  Just received call from [place], [place] on [facility] that Room [phone] had pulled his CPAP off about 10 minutes ago.  Patient stated that he has too many lines hooked up to him and that the machine made him feel claustrophobic despite the fact that the patient was wearing a nasal mask only.

## 2018-03-17 NOTE — Evaluation (Addendum)
Physical Therapy Evaluation Patient Details Name: Frank Morales MRN: [id number] DOB: 02-19-1961 Today's Date: 03/17/2018   History of Present Illness  57yo male admitted with CHF exacerbation with pleural effusion. PMhx: morbid obesity, HFpEF, Afib, HTN, OSA, history of tobacco use and alcohol use and chronic pansinusitis  Clinical Impression  Pt pleasant, prone in bed on arrival. Pt reports having not slept in 4 days but when he transitioned to sitting was sitting on O2 tubing without awareness. Pt with SpO2 90% on 4L at rest with requirement of 6L with gait to maintain 90% and return to 4L at rest. Pt admits he cooks or eats whatever family brings him, he does not weigh daily and does not move much. Pt educated for need to monitor sodium, diet, fluid and weigh daily as well as recommendation for pulse ox for home use and walking program. Pt required assist to don socks due to body habitus and states he typically wears slide on shoes. Pt acknowledges education but does not appear ready to change current habits. Pt with decreased activity tolerance, safety awareness for heart failure and function, decreased knowledge of DME and balance who will benefit from acute therapy to maximize mobility, function and safety for return home. Encouraged daily hall ambulation with RW and O2.     Follow Up Recommendations No PT follow up    Equipment Recommendations  3in1 (PT)(bariatric)    Recommendations for Other Services       Precautions / Restrictions Precautions Precautions: Fall Precaution Comments: watch sats      Mobility  Bed Mobility Overal bed mobility: Independent                Transfers Overall transfer level: Independent                  Ambulation/Gait Ambulation/Gait assistance: Supervision Gait Distance (Feet): 90 Feet Assistive device: Rolling walker (2 wheeled) Gait Pattern/deviations: Step-through pattern;Decreased stride length;Wide base of  support;Trunk flexed   Gait velocity interpretation: 1.31 - 2.62 ft/sec, indicative of limited community ambulator General Gait Details: cues for posture, position to RW and breathing technique. pt required 6L to maintain 90% with gait  Stairs            Wheelchair Mobility    Modified Rankin (Stroke Patients Only)       Balance Overall balance assessment: Needs assistance   Sitting balance-Leahy Scale: Normal       Standing balance-Leahy Scale: Fair Standing balance comment: pt able to stand without UE support but preference for RW with gait                             Pertinent Vitals/Pain Pain Assessment: No/denies pain    Home Living Family/patient expects to be discharged to:: Private residence Living Arrangements: Other relatives(uncle) Available Help at Discharge: Family;Available 24 hours/day Type of Home: House Home Access: Level entry     Home Layout: One level Home Equipment: None      Prior Function Level of Independence: Independent         Comments: Does not work; reports he sits majority of day. was wearing 2L at home. Has assist for housework and reports he cooks for himself and uncle or others will bring food and he will eat whatever they bring     Hand Dominance        Extremity/Trunk Assessment   Upper Extremity Assessment Upper Extremity Assessment: Generalized weakness  Lower Extremity Assessment Lower Extremity Assessment: Generalized weakness(decreased ROM due to body habitus)    Cervical / Trunk Assessment Cervical / Trunk Assessment: Other exceptions Cervical / Trunk Exceptions: rounded shoulders  Communication   Communication: No difficulties  Cognition Arousal/Alertness: Awake/alert Behavior During Therapy: WFL for tasks assessed/performed Overall Cognitive Status: Within Functional Limits for tasks assessed                                        General Comments      Exercises      Assessment/Plan    PT Assessment Patient needs continued PT services  PT Problem List Decreased balance;Decreased activity tolerance;Decreased mobility;Decreased knowledge of use of DME;Cardiopulmonary status limiting activity       PT Treatment Interventions Gait training;Therapeutic activities;Therapeutic exercise;Functional mobility training;Patient/family education;DME instruction;Balance training    PT Goals (Current goals can be found in the Care Plan section)  Acute Rehab PT Goals Patient Stated Goal: return home PT Goal Formulation: With patient Time For Goal Achievement: 03/31/18 Potential to Achieve Goals: Good    Frequency Min 3X/week   Barriers to discharge Decreased caregiver support      Co-evaluation               AM-PAC PT "6 Clicks" Mobility  Outcome Measure Help needed turning from your back to your side while in a flat bed without using bedrails?: None Help needed moving from lying on your back to sitting on the side of a flat bed without using bedrails?: None Help needed moving to and from a bed to a chair (including a wheelchair)?: None Help needed standing up from a chair using your arms (e.g., wheelchair or bedside chair)?: None Help needed to walk in hospital room?: A Little Help needed climbing 3-5 steps with a railing? : A Little 6 Click Score: 22    End of Session Equipment Utilized During Treatment: Gait belt;Oxygen Activity Tolerance: Patient limited by fatigue Patient left: in chair;with call bell/phone within reach Nurse Communication: Mobility status PT Visit Diagnosis: Other abnormalities of gait and mobility (R26.89);Muscle weakness (generalized) (M62.81)    Time: 9024-0973 PT Time Calculation (min) (ACUTE ONLY): 18 min   Charges:   PT Evaluation $PT Eval Moderate Complexity: [place], PT Acute Rehabilitation Services Pager: [phone] Office: [phone]   Aubriana Ravelo B Georgine Wiltse 03/17/2018,  10:43 AM

## 2018-03-18 DIAGNOSIS — E662 Morbid (severe) obesity with alveolar hypoventilation: Secondary | ICD-10-CM

## 2018-03-18 LAB — BASIC METABOLIC PANEL
Anion gap: 6 (ref 5–15)
BUN: 9 mg/dL (ref 6–20)
CO2: 48 mmol/L — ABNORMAL HIGH (ref 22–32)
Calcium: 8.8 mg/dL — ABNORMAL LOW (ref 8.9–10.3)
Chloride: 84 mmol/L — ABNORMAL LOW (ref 98–111)
Creatinine, Ser: 0.73 mg/dL (ref 0.61–1.24)
GFR calc Af Amer: >60 mL/min (ref >60)
GFR calc non Af Amer: >60 mL/min (ref >60)
GLUCOSE: 112 mg/dL — AB (ref 70–99)
Potassium: 4.1 mmol/L (ref 3.5–5.1)
Sodium: 138 mmol/L (ref 135–145)

## 2018-03-18 NOTE — Plan of Care (Signed)

## 2018-03-18 NOTE — Plan of Care (Signed)
  Problem: Education: Goal: Knowledge of General Education information will improve Description Including pain rating scale, medication(s)/side effects and non-pharmacologic comfort measures 03/18/2018 2241 by Athen Riel, Sonny Dandy, RN Outcome: Progressing 03/18/2018 2238 by Earlyne Iba, RN Outcome: Progressing

## 2018-03-18 NOTE — Progress Notes (Addendum)
Subjective: Frank Morales is a 58 y.o. with PMH of morbid obesity, A.fib on Xarelto, HFpEF, HTN and OSA admit for acute on chronic right sided heart failure on hospital day 4.  Frank Morales was examined and evaluated at bedside this AM. Patient is feeling okay this AM. His sleep was okay. He did wear the mask last night until about 3AM. He is unsure if he feels better because of the mask or because he finally got some sleep. We discussed that we are monitoring his BMP and urine output. As of now it appears the diuretic dose is appropriate. He voices understanding. Again reiterated the importance of CPAP. Continues to have dry cough.   Objective: Vital signs in last 24 hours: Vitals:   03/17/18 1943 03/17/18 1944 03/18/18 0458 03/18/18 0500  BP:  107/74 122/74   Pulse:  80 94   Resp:  19 16   Temp: 98.6 F (37 C) 98 F (36.7 C)    TempSrc: Oral Oral    SpO2:  96% 99%   Weight:    (!) 165.9 kg  Height:       Physical Exam  Constitutional: He is well-developed, well-nourished, and in no distress. No distress.  Morbidly obese  Eyes: Pupils are equal, round, and reactive to light. EOM are normal.  Injected conjunctivae  Neck: Normal range of motion. Neck supple.  Wide neck  Cardiovascular: Normal rate, regular rhythm, normal heart sounds and intact distal pulses.  No murmur heard. Pulmonary/Chest: Effort normal. He has rales (bilateral rales up to mid thorax).  Abdominal: Bowel sounds are normal. He exhibits distension (tense abdomen ). There is no abdominal tenderness. There is no guarding.  Musculoskeletal: Normal range of motion.        General: Edema (2+ pitting edema up to mid-lower abdomen) present.  Skin: Skin is warm and dry.  62mm superficial ulcer with oozing clear colored exudate noted on stomach.    Assessment/Plan:  Principal Problem:   Acute on chronic heart failure (HCC) Active Problems:   Morbid obesity (HCC)   Orthopnea   Atrial fibrillation and flutter  (HCC)   OSA (obstructive sleep apnea)   Pleural effusion on right  Frank Morales is a 58 y.o. with PMH of morbid obesity, A.fib on Xarelto, HFpEF, HTN and OSA admit for acute on chronic right sided heart failure. Good urinary output on current lasix dose. Electrolytes stable. Continue to diuresis.  Acute on Chronic Right Sided Heart Failure Net Output 2L yesterday. K 4.1 Creatinine trend 0.78->0.73 - C/w furosemide 100mg  TID - Daily post void residuals - Strict I&Os - Daily Weights - Fluid restriction - Keep O2 sat >88 - C/w K-dur 67mEq BID - Replenish K as needed >0.8  Metabolic alkalosis 2/2 diuretic useand OHS Currently on iv furosemide 100mg  TID. Bicarb @ 42 for the last 3 days - C/w to encourage CPAP use - Trend BMP - Will look into indications for acetazolamide   Atrial Fibrillation Rate controlled. 80-100 on tele - C/w home meds: diltiazem 480mg  daily, xarelto 20mg  daily - TStable, will discontinue telemetry   OSA Poor adherence to CPAP use due to discomfort with tubing length - C/w to encourage CPAP use  R sided pleural effusion Drained by IR on 03/15/18, total protein 7.2, pleural protein 3.7, serum LDH 153, Fluid LDH 95. Exudative effusion per Light's Criteria (LDH ratio @ 0.62) but it's borderline and it may be falsely positive due to concurrent diuretic use. Albumin gradient 1.2.  -  F/u cytology  DVT prophx: Xarelto Diet:  Cardiac w/ fluid restriction Code: Full  Dispo: Anticipated discharge in approximately 3-4 day(s). Still extremely volume overload and at risk for respiratory compromise if discharged too early.  [name], MD 03/18/2018, 7:51 AM  Pager: [phone]

## 2018-03-18 NOTE — Progress Notes (Signed)
Pt refuses CPAP. No distress noted. RT will continue to monitor.

## 2018-03-19 DIAGNOSIS — I480 Paroxysmal atrial fibrillation: Secondary | ICD-10-CM

## 2018-03-19 LAB — BASIC METABOLIC PANEL
Anion gap: 7 (ref 5–15)
BUN: 11 mg/dL (ref 6–20)
CO2: 44 mmol/L — ABNORMAL HIGH (ref 22–32)
Calcium: 8.7 mg/dL — ABNORMAL LOW (ref 8.9–10.3)
Chloride: 88 mmol/L — ABNORMAL LOW (ref 98–111)
Creatinine, Ser: 0.74 mg/dL (ref 0.61–1.24)
GFR calc Af Amer: >60 mL/min (ref >60)
GFR calc non Af Amer: >60 mL/min (ref >60)
Glucose, Bld: 96 mg/dL (ref 70–99)
Potassium: 4.1 mmol/L (ref 3.5–5.1)
SODIUM: 139 mmol/L (ref 135–145)

## 2018-03-19 NOTE — Plan of Care (Signed)
  Problem: Education: Goal: Knowledge of General Education information will improve Description Including pain rating scale, medication(s)/side effects and non-pharmacologic comfort measures Outcome: Progressing   Problem: Health Behavior/Discharge Planning: Goal: Ability to manage health-related needs will improve Outcome: Progressing   Problem: Clinical Measurements: Goal: Ability to maintain clinical measurements within normal limits will improve Outcome: Progressing Goal: Will remain free from infection Outcome: Progressing Goal: Respiratory complications will improve Outcome: Progressing   Problem: Elimination: Goal: Will not experience complications related to bowel motility Outcome: Progressing Goal: Will not experience complications related to urinary retention Outcome: Progressing

## 2018-03-19 NOTE — Progress Notes (Signed)
   Subjective: Mr. Olazabal was seen sitting up comfortably in a chair this morning. No overnight events. He feels his breathing and lower extremity swelling have improved. He is urinating well. He said he felt SOB on ambulation. He was only able to make it halfway down the hall and then started getting SOB. He is normally able to walk further without difficulty breathing.  Objective:  Vital signs in last 24 hours: Vitals:   03/18/18 0500 03/18/18 0948 03/18/18 1438 03/19/18 0414  BP:  112/65 128/75 110/77  Pulse:   67 90  Resp:   14 15  Temp:   98.5 F (36.9 C) 98.3 F (36.8 C)  TempSrc:   Oral   SpO2:   95% 99%  Weight: (!) 165.9 kg   (!) 166.7 kg  Height:       Physical Exam  Constitutional: He is oriented to person, place, and time and well-developed, well-nourished, and in no distress.  Cardiovascular: Normal rate, regular rhythm and normal heart sounds.  No murmur heard. Pulmonary/Chest: Effort normal. No respiratory distress. He has no wheezes. He has rales.  Abdominal: Soft. Bowel sounds are normal. He exhibits distension. There is no abdominal tenderness.  Musculoskeletal:        General: Edema present. No tenderness.  Neurological: He is alert and oriented to person, place, and time.  Skin: Skin is warm and dry.  Psychiatric: Mood, memory, affect and judgment normal.    Assessment/Plan:  Principal Problem:   Acute on chronic heart failure (HCC) Active Problems:   Morbid obesity (HCC)   Orthopnea   Atrial fibrillation and flutter (HCC)   OSA (obstructive sleep apnea)   Pleural effusion on right  Suhas Estis Riddickis a 58 y.o.with PMH of morbid obesity, A.fib on Xarelto, HFpEF, HTN and OSAadmit for acute on chronicright sidedheart failure. Good urinary output on current lasix dose. Electrolytes stable. Continue to diuresis.  Acute on ChronicRight SidedHeart Failure Output ~5.7L yesterday, net output ~4L. K 4.1 Creatinine 0.74 - C/w furosemide 100mg  TID -  Daily post void residuals - Strict I&Os - Daily Weights - Fluid restriction - Keep O2 sat >88 - C/w K-dur 60mEq BID - Replenish K as needed >0.3  Metabolic alkalosis 2/2 diuretic useand OHS Currently on IV furosemide 100mg  TID. Bicarb @ 44. Acetazolamide is not contraindicated in hypercapnic chronic lung disease so can be started if CO2 continues to rise.   - C/w to encourage CPAP use - Trend BMP  Atrial Fibrillation Rate controlled.  - C/w home meds: diltiazem 480mg  daily, xarelto 20mg  daily  OSA - C/w to encourage CPAP use  R sided pleural effusion - F/u cytology   Dispo: Anticipated discharge in approximately 3-4 day(s). Still extremely volume overload and at risk for respiratory compromise if discharged too early.  Mike Craze, DO 03/19/2018, 6:49 AM Pager: [phone]

## 2018-03-20 LAB — BASIC METABOLIC PANEL
Anion gap: 9 (ref 5–15)
BUN: 10 mg/dL (ref 6–20)
CO2: 41 mmol/L — AB (ref 22–32)
Calcium: 8.7 mg/dL — ABNORMAL LOW (ref 8.9–10.3)
Chloride: 87 mmol/L — ABNORMAL LOW (ref 98–111)
Creatinine, Ser: 0.74 mg/dL (ref 0.61–1.24)
GFR calc Af Amer: >60 mL/min (ref >60)
GFR calc non Af Amer: >60 mL/min (ref >60)
Glucose, Bld: 126 mg/dL — ABNORMAL HIGH (ref 70–99)
Potassium: 3.5 mmol/L (ref 3.5–5.1)
Sodium: 137 mmol/L (ref 135–145)

## 2018-03-20 NOTE — Progress Notes (Signed)
PT Cancellation Note  Patient Details Name: CORAL SOLER MRN: [id number] DOB: May 06, 1960   Cancelled Treatment:    Reason Eval/Treat Not Completed: Patient at procedure or test/unavailable (off floor to visit another pt). Will follow-up for PT treatment as schedule permits.  Mabeline Caras, PT, DPT Acute Rehabilitation Services  Pager [phone] Office [place] 03/20/2018, 11:06 AM

## 2018-03-20 NOTE — Progress Notes (Signed)
Pt refuses bipap. RT will continue to monitor.

## 2018-03-20 NOTE — Progress Notes (Signed)
   Subjective: Mr. Longest was seen sitting up in a chair comfortably this morning. No overnight events. He feels his breathing and swelling have both improved. He has not ambulated today.   Objective:  Vital signs in last 24 hours: Vitals:   03/19/18 0938 03/19/18 1316 03/19/18 2016 03/20/18 0434  BP: 104/79 (!) 106/53 130/68 104/66  Pulse: 80 79 88 95  Resp: 16 16 18 18   Temp:  97.8 F (36.6 C) 97.9 F (36.6 C) 98.4 F (36.9 C)  TempSrc:  Oral Oral Oral  SpO2: 100% 99% 98% 99%  Weight:    (!) 166.5 kg  Height:       Physical Exam  Constitutional: He is oriented to person, place, and time and well-developed, well-nourished, and in no distress.  Neck: No JVD present.  Cardiovascular: Normal rate, regular rhythm and normal heart sounds.  No murmur heard. Pulmonary/Chest: Effort normal. No respiratory distress. He has no wheezes. He has rales.  Right sided crackles  Abdominal: He exhibits distension. There is no abdominal tenderness.  Musculoskeletal:        General: Edema present.     Comments: 2+ pitting edema bilateral LE  Neurological: He is alert and oriented to person, place, and time.  Skin: Skin is warm and dry.  Psychiatric: Mood, memory, affect and judgment normal.    Assessment/Plan:  Principal Problem:   Acute on chronic heart failure (HCC) Active Problems:   Morbid obesity (HCC)   Orthopnea   Atrial fibrillation and flutter (HCC)   OSA (obstructive sleep apnea)   Pleural effusion on right  Khalif Stender Riddickis a 58 y.o.with PMH of morbid obesity, A.fib on Xarelto, HFpEF, HTN and OSAadmit for acute on chronicright sidedheart failure.Good urinary output on current lasix dose. Electrolytes stable. Continue to diuresis.  Acute on ChronicRight SidedHeart Failure Output ~3.3L yesterday, net output ~1.7L.K 3.5, Creatinine 0.74. Weight is 367.1, dry weight ~354.  - C/w furosemide 100mg  TID - Daily post void residuals - Strict I&Os - Daily Weights -  Fluid restriction - Keep O2 sat >88 - C/w K-dur 25mEq BID - Replenish K as needed >6.9  Metabolic alkalosis 2/2 diuretic useand OHS Currently on IV furosemide 100mg  TID. Bicarb @ 41 - C/w to encourage CPAP use - Trend BMP  Atrial Fibrillation Rate controlled.  - C/w home meds: diltiazem 480mg  daily, xarelto 20mg  daily  OSA - C/w to encourage CPAP use  R sided pleural effusion - F/u cytology  Dispo: Anticipated discharge in approximately 3-4 day(s). Patient is still extremely volume overloaded, will continue to diurese to get him back to his dry weight.  Mike Craze, DO 03/20/2018, 6:55 AM Pager: [phone]

## 2018-03-20 NOTE — Progress Notes (Signed)
Pt refusing BIPAP tonight.

## 2018-03-21 LAB — BASIC METABOLIC PANEL
ANION GAP: 7 (ref 5–15)
BUN: 12 mg/dL (ref 6–20)
CO2: 41 mmol/L — ABNORMAL HIGH (ref 22–32)
Calcium: 8.8 mg/dL — ABNORMAL LOW (ref 8.9–10.3)
Chloride: 88 mmol/L — ABNORMAL LOW (ref 98–111)
Creatinine, Ser: 0.85 mg/dL (ref 0.61–1.24)
GFR calc Af Amer: >60 mL/min (ref >60)
GFR calc non Af Amer: >60 mL/min (ref >60)
Glucose, Bld: 101 mg/dL — ABNORMAL HIGH (ref 70–99)
Potassium: 4 mmol/L (ref 3.5–5.1)
Sodium: 136 mmol/L (ref 135–145)

## 2018-03-21 MED ORDER — AZELASTINE HCL 0.1 % NA SOLN
2.0000 | Freq: Two times a day (BID) | NASAL | Status: DC
  Administered 2018-03-21 – 2018-03-22 (×3): 2 via NASAL
  Filled 2018-03-21: qty 30

## 2018-03-21 NOTE — Progress Notes (Signed)
Pt refuses cpap

## 2018-03-21 NOTE — Progress Notes (Signed)
   Subjective: Frank Morales was seen sitting up in the chair comfortably this morning. He feels that his breathing and edema are continuing to improve. No acute complaints.   Objective:  Vital signs in last 24 hours: Vitals:   03/20/18 0434 03/20/18 1436 03/20/18 2021 03/21/18 0433  BP: 104/66 120/76 120/79 113/85  Pulse: 95 68 (!) 56 92  Resp: 18  20 18   Temp: 98.4 F (36.9 C) 98.1 F (36.7 C) 98.7 F (37.1 C) 98.5 F (36.9 C)  TempSrc: Oral Oral Oral Oral  SpO2: 99% 100% 96% 100%  Weight: (!) 166.5 kg   (!) 166.7 kg  Height:       Physical Exam  Constitutional: He is oriented to person, place, and time and well-developed, well-nourished, and in no distress.  Cardiovascular: Normal rate, regular rhythm and normal heart sounds.  No murmur heard. Pulmonary/Chest: Effort normal and breath sounds normal. No respiratory distress. He has no wheezes. He has no rales.  Abdominal: Bowel sounds are normal. He exhibits distension. There is no abdominal tenderness.  Musculoskeletal:        General: No edema.     Comments: 2+ pitting edema in bilateral LE  Neurological: He is alert and oriented to person, place, and time.  Skin: Skin is warm and dry.  Psychiatric: Mood, memory, affect and judgment normal.    Assessment/Plan:  Principal Problem:   Acute on chronic heart failure (HCC) Active Problems:   Morbid obesity (HCC)   Orthopnea   Atrial fibrillation and flutter (HCC)   OSA (obstructive sleep apnea)   Pleural effusion on right  Frank Morales Riddickis a 58 y.o.with PMH of morbid obesity, A.fib on Xarelto, HFpEF, HTN and OSAadmit for acute on chronicright sidedheart failure.Good urinary output on current lasix dose. Electrolytes stable. Continue to diuresis.  Acute on ChronicRight SidedHeart Failure Output ~3Lyesterday.K 4.0, Creatinine0.85. Weight is 367.6, dry weight ~354. Hospital weights have fluctuated, likely unreliable. Patient is continuing to have good urinary  output. If he plateaus we will consider changing to an alternative diuretic. - C/w furosemide 100mg  TID - Daily post void residuals - Strict I&Os - Daily Weights - Fluid restriction - Keep O2 sat >88 - C/w K-dur 66mEq BID - Replenish K as needed >7.5  Metabolic alkalosis 2/2 diuretic useand OHS Currently onIVfurosemide 100mg  TID. Bicarb @ 41, stable - C/w to encourage CPAP use - Trend BMP  Atrial Fibrillation Rate controlled.  - C/w home meds: diltiazem 480mg  daily, xarelto 20mg  daily  OSA - C/w to encourage CPAP use  R sided pleural effusion - F/u cytology  Dispo: Anticipated discharge in approximately 2-3 day(s).   Frank Craze, DO 03/21/2018, 7:19 AM Pager: [phone]

## 2018-03-22 DIAGNOSIS — Z6841 Body Mass Index (BMI) 40.0 and over, adult: Secondary | ICD-10-CM

## 2018-03-22 LAB — BASIC METABOLIC PANEL
Anion gap: 10 (ref 5–15)
BUN: 10 mg/dL (ref 6–20)
CO2: 38 mmol/L — ABNORMAL HIGH (ref 22–32)
Calcium: 8.8 mg/dL — ABNORMAL LOW (ref 8.9–10.3)
Chloride: 87 mmol/L — ABNORMAL LOW (ref 98–111)
Creatinine, Ser: 0.83 mg/dL (ref 0.61–1.24)
GFR calc Af Amer: >60 mL/min (ref >60)
GFR calc non Af Amer: >60 mL/min (ref >60)
GLUCOSE: 108 mg/dL — AB (ref 70–99)
Potassium: 3.4 mmol/L — ABNORMAL LOW (ref 3.5–5.1)
Sodium: 135 mmol/L (ref 135–145)

## 2018-03-22 MED ORDER — TORSEMIDE 20 MG PO TABS
120.0000 mg | ORAL_TABLET | Freq: Every day | ORAL | Status: DC
  Administered 2018-03-22: 120 mg via ORAL
  Filled 2018-03-22: qty 6

## 2018-03-22 MED ORDER — TORSEMIDE 20 MG PO TABS: 120.0000 mg | ORAL_TABLET | Freq: Two times a day (BID) | ORAL | 0 refills | Status: DC

## 2018-03-22 NOTE — Progress Notes (Signed)
Pt stable, discharged home with friend via wheelchair

## 2018-03-22 NOTE — Progress Notes (Addendum)
   Subjective: Mr. Treadway reported feeling well this morning. He was no longer requiring supplemental oxygen. He reported he ambulated well without it. He is continuing to have good urinary output and feels his LE edema and abdominal distension continue to go down.   Objective:  Vital signs in last 24 hours: Vitals:   03/21/18 2009 03/21/18 2027 03/22/18 0530 03/22/18 0531  BP: (!) 131/95   (!) 155/100  Pulse: 85   93  Resp:      Temp:  98.8 F (37.1 C)  99.2 F (37.3 C)  TempSrc:  Oral  Oral  SpO2: 96%   91%  Weight:   (!) 166 kg   Height:       Physical Exam  Constitutional: He is oriented to person, place, and time and well-developed, well-nourished, and in no distress.  Cardiovascular: Normal rate, regular rhythm and normal heart sounds.  No murmur heard. Pulmonary/Chest: Effort normal and breath sounds normal. No respiratory distress. He has no wheezes. He has no rales.  Abdominal: Soft. Bowel sounds are normal. He exhibits distension. There is no abdominal tenderness.  Musculoskeletal:        General: Edema present.  Neurological: He is alert and oriented to person, place, and time.  Skin: Skin is warm and dry.  Psychiatric: Mood, memory, affect and judgment normal.    Assessment/Plan:  Principal Problem:   Acute on chronic heart failure (HCC) Active Problems:   Morbid obesity (HCC)   Orthopnea   Atrial fibrillation and flutter (HCC)   OSA (obstructive sleep apnea)   Pleural effusion on right  Dareion Kneece Riddickis a 58 y.o.with PMH of morbid obesity, A.fib on Xarelto, HFpEF, HTN and OSAadmit for acute on chronicright sidedheart failure.Good urinary output on current lasix dose. Electrolytes stable. Continue to diuresis.  Acute on ChronicRight SidedHeart Failure Output ~3.8L.Marland KitchenK3.4,Creatinine0.83. Weight is 366, dry weight ~354.Hospital weights have fluctuated, likely unreliable. Patient is continuing to have good urinary output. If he plateaus we will  consider changing to an alternative diuretic. He is no longer requiring supplemental oxygen. Will switch him to oral torsemide today and if he continues to have good urinary output he may be able to be discharged home today. Will also refer patient to a heart failure clinic to help manage and prevent HF exacerbations.  - Torsemide 120 mg bid - Daily post void residuals - Strict I&Os - Daily Weights - Fluid restriction - Keep O2 sat >88 - C/w K-dur 19mEq BID - Replenish K as needed >1.7  Metabolic alkalosis 2/2 diuretic useand OHS Switched to torsemide 120 mg bid. Bicarb @38  - C/w to encourage CPAP use - Trend BMP  Atrial Fibrillation - C/w home meds: diltiazem 480mg  daily, xarelto 20mg  daily  OSA - C/w to encourage CPAP use  R sided pleural effusion - F/u cytology  Dispo: Anticipated discharge in approximately 0-1 day(s).   Mike Craze, DO 03/22/2018, 6:32 AM Pager: [phone]

## 2018-03-31 DIAGNOSIS — J324 Chronic pansinusitis: Secondary | ICD-10-CM | POA: Diagnosis not present

## 2018-04-02 ENCOUNTER — Telehealth: Payer: Self-pay | Admitting: *Deleted

## 2018-04-02 NOTE — Telephone Encounter (Signed)
Patient with diagnosis of afib on xarelto for anticoagulation.    Procedure: ENDOSCOPIC SINUS SURGERY  Date of procedure: TBD  CHADS2-VASc score of  2 (CHF, HTN, AGE, DM2, stroke/tia x 2, CAD, AGE, male)    Xarelto will be adequately cleared in two day, recommend holding Xarelto for only 2 days

## 2018-04-02 NOTE — Telephone Encounter (Signed)
   [facility] Pre-operative Risk Assessment    Request for surgical clearance:  1. What type of surgery is being performed? ENDOSCOPIC SINUS SURGERY   2. When is this surgery scheduled? TBD   3. What type of clearance is required (medical clearance vs. Pharmacy clearance to hold med vs. Both)? BOTH  4. Are there any medications that need to be held prior to surgery and how long?XAREL7 DAYS PRIOR   5. Practice name and name of physician performing surgery? [facility] [place] [facility]   6. What is your office phone number [phone]    7.   What is your office fax number [phone]  8.   Anesthesia type (None, local, MAC, general) ? GENERAL   Julaine Hua 04/02/2018, 1:51 PM  _________________________________________________________________   (provider comments below)

## 2018-04-07 ENCOUNTER — Other Ambulatory Visit: Payer: Self-pay

## 2018-04-07 ENCOUNTER — Ambulatory Visit (INDEPENDENT_AMBULATORY_CARE_PROVIDER_SITE_OTHER): Payer: Medicaid Other | Admitting: Internal Medicine

## 2018-04-07 DIAGNOSIS — I499 Cardiac arrhythmia, unspecified: Secondary | ICD-10-CM

## 2018-04-07 DIAGNOSIS — I5032 Chronic diastolic (congestive) heart failure: Secondary | ICD-10-CM

## 2018-04-07 DIAGNOSIS — Z79899 Other long term (current) drug therapy: Secondary | ICD-10-CM

## 2018-04-07 DIAGNOSIS — J90 Pleural effusion, not elsewhere classified: Secondary | ICD-10-CM

## 2018-04-07 NOTE — Assessment & Plan Note (Addendum)
Found at last hospitalization.  Transudate versus exudate.  (Borderline ratio. pleural fluid LDH/serum LDH: 0.62, pleural fluid protein/serum protein: 0.51. Cytology came back negative for malignancy.  Was likely secondary to CHF exacerbation and improved. No worsening of shortness of breath. Lung exam improved today. Will continue CHF treatment. No further work up at this point.

## 2018-04-07 NOTE — Progress Notes (Signed)
   CC: [place] follow-up for CHF exacerbation  HPI:  [name] is a 59 y.o. male with past medical history listed below, came into the clinic today for follow-up of recent hospitalization due to acute on chronic CHF exacerbation and right side pleural effusion.  Please see problem based charting for further details and assessment and plan.  Past Medical History:  Diagnosis Date  . Atrial fibrillation and flutter ([place]) 08/17/2015   A. S/p failed DCCV // b. Severe BAE on echo >> rate control strategy (has seen AF clinic) // c. Xarelto for anticoag (CHADS2-VASc=2 / CHF, HTN)  . Chronic diastolic CHF (congestive heart failure) ([place]) 08/30/2015   A. Echo 6/17: Apical HK, moderate focal basal and mild concentric LVH, EF 50-55, diffuse HK, trivial MR, severe BAE, mild TR, PASP 37  . Hepatitis C, chronic ([place]) 08/19/2015  . History of cardiac catheterization    a. LHC 6/17: LAD irregs, o/w no CAD  . OSA (obstructive sleep apnea) 11/21/2015  . Sleep apnea   . Tobacco abuse    Review of Systems: Review of Systems  Constitutional: Negative for chills and fever.  HENT: Positive for congestion.   Respiratory: Positive for cough.   Cardiovascular: Negative for chest pain.  Genitourinary: Negative for dysuria.  Neurological: Negative for dizziness and headaches.      Physical Exam:  Vitals:   04/07/18 1408  BP: 130/78  Pulse: (!) 108  Temp: 98.4 F (36.9 C)  TempSrc: Oral  SpO2: 92%  Weight: (!) 356 lb 1.6 oz (161.5 kg)  Height: 5\' 11"  (1.803 m)   Physical Exam Vitals signs reviewed.  Constitutional:      Appearance: He is obese.  HENT:     Head: Normocephalic and atraumatic.  Cardiovascular:     Rate and Rhythm: Rhythm irregular.     Heart sounds: No murmur.  Pulmonary:     Effort: Pulmonary effort is normal.     Breath sounds: Rales present.  Abdominal:     General: Bowel sounds are normal.     Palpations: Abdomen is soft.     Tenderness: There is no abdominal  tenderness.  Musculoskeletal:        General: No swelling or tenderness.     Right lower leg: Edema present.     Left lower leg: Edema present.  Neurological:     General: No focal deficit present.     Mental Status: He is alert and oriented to person, place, and time. Mental status is at baseline.  Psychiatric:        Mood and Affect: Mood normal.        Behavior: Behavior normal.     Assessment & Plan:   See Encounters Tab for problem based charting.  Patient discussed with Dr. Dareen Piano

## 2018-04-07 NOTE — Patient Instructions (Addendum)
Thank you for allowing [place] to provide your care today. Today we discussed your volume status for heart failure.  You lost 10 pounds since last hospitalization.  Please continue great job regarding taking your medication and as instructed, limit fluid intake and continuing low-salt diet. I have ordered some blood test for you. I will call if any are abnormal.    Today I did not make any changes to your medications.   Make sure to follow-up with heart failure doctor as recommended at hospital discharge. Please follow-up in 3 months or as needed.   Should you have any questions or concerns please call the internal medicine clinic at [phone].

## 2018-04-07 NOTE — Assessment & Plan Note (Addendum)
Patient switch from furosemide to torsemide 120 mg twice daily since last hospital discharge.  Weight is trending down. (lost 10 lb in apst 2 weeks). Endorses low-sodium diet and . Denies worsening of shortness of breath, or worsening of lower extremity edema. (Patient reports difficulty breathing at night secondary to nasal congestion, but otherwise no difficulty breathing)  - BMP today -Continue torsemide 120 mg twice daily (May adjust the dose based on BMP results) --Return in clinic in 2 to 3 months or sooner as needed -F/u with haart failure cardiologist as scheduled -Ct low sodium and fluid restricted diert and daily weight

## 2018-04-08 DIAGNOSIS — I5032 Chronic diastolic (congestive) heart failure: Secondary | ICD-10-CM | POA: Diagnosis not present

## 2018-04-08 DIAGNOSIS — I509 Heart failure, unspecified: Secondary | ICD-10-CM | POA: Diagnosis not present

## 2018-04-08 LAB — BMP8+ANION GAP
ANION GAP: 18 mmol/L (ref 10.0–18.0)
BUN/Creatinine Ratio: 18 (ref 9–20)
BUN: 18 mg/dL (ref 6–24)
CO2: 28 mmol/L (ref 20–29)
Calcium: 9 mg/dL (ref 8.7–10.2)
Chloride: 90 mmol/L — ABNORMAL LOW (ref 96–106)
Creatinine, Ser: 0.98 mg/dL (ref 0.76–1.27)
GFR calc Af Amer: 99 mL/min/{1.73_m2} (ref >59)
GFR calc non Af Amer: 86 mL/min/{1.73_m2} (ref >59)
Glucose: 110 mg/dL — ABNORMAL HIGH (ref 65–99)
Potassium: 3.8 mmol/L (ref 3.5–5.2)
Sodium: 136 mmol/L (ref 134–144)

## 2018-04-08 NOTE — Addendum Note (Signed)
Addended by: Aldine Contes on: 04/08/2018 10:05 AM   Modules accepted: Level of Service

## 2018-04-08 NOTE — Telephone Encounter (Signed)
   Primary Cardiologist: Dorris Carnes, MD  Chart reviewed as part of pre-operative protocol coverage.   Per pharmacy protocol- Xarelto will be adequately cleared in two days, recommend holding Xarelto for only 2 days.   I will route this recommendation to the requesting party via Epic fax function and remove from pre-op pool.  Please call with questions.  Kerin Ransom, PA-C 04/08/2018, 9:00 AM

## 2018-04-08 NOTE — Telephone Encounter (Signed)
   Primary Cardiologist: Dorris Carnes, MD  Chart reviewed as part of pre-operative protocol coverage. The patient had minimal CAD at cath in 2017 and his last echo Nov 2019 showed normal LVF. We have been unable to contact the patient to discuss clearance so an official clearance cannot be made.   I will route this recommendation to the requesting party via Epic fax function and remove from pre-op pool.  Please call with questions.  Kerin Ransom, PA-C 04/08/2018, 9:10 AM

## 2018-04-08 NOTE — Progress Notes (Signed)
Internal Medicine Clinic Attending  Case discussed with Dr. Masoudi  at the time of the visit.  We reviewed the resident's history and exam and pertinent patient test results.  I agree with the assessment, diagnosis, and plan of care documented in the resident's note.  

## 2018-04-09 ENCOUNTER — Telehealth: Payer: Self-pay | Admitting: Internal Medicine

## 2018-04-09 ENCOUNTER — Encounter: Payer: Self-pay | Admitting: Internal Medicine

## 2018-04-14 ENCOUNTER — Ambulatory Visit: Payer: Medicaid Other | Admitting: Physician Assistant

## 2018-04-14 NOTE — Telephone Encounter (Signed)
   Primary Cardiologist: Dorris Carnes, MD  Chart reviewed as part of pre-operative protocol coverage. Patient was contacted 04/14/2018 in reference to pre-operative risk assessment for pending surgery as outlined below.  Frank Morales was last seen on 10/23/2017 by Robbie Lis.  Since that day, Frank Morales has had two admissions for acute on chronic CHF. He reports doing well from a cardiac standpoint since discharge from the hospital 03/14/2018. He does experience DOE but this is overall improved. He is able to complete household chores and walk ~1 block without anginal complaints. He denies ever having chest pain. Therefore, based on ACC/AHA guidelines, the patient would be at acceptable risk for the planned procedure without further cardiovascular testing.   Per pharmacy recommendations, patient can hold xarelto 2 days prior to his upcoming ENT surgery. Xarelto should be restarted when cleared to do so by his surgeon.  I will route this recommendation to the requesting party via Epic fax function and remove from pre-op pool.  Please call with questions.  Abigail Butts, PA-C 04/14/2018, 11:31 AM

## 2018-04-14 NOTE — Telephone Encounter (Signed)
° °  Please call patient at [phone] or [phone]

## 2018-04-18 DIAGNOSIS — I5032 Chronic diastolic (congestive) heart failure: Secondary | ICD-10-CM | POA: Diagnosis not present

## 2018-04-18 DIAGNOSIS — I509 Heart failure, unspecified: Secondary | ICD-10-CM | POA: Diagnosis not present

## 2018-04-28 ENCOUNTER — Ambulatory Visit
Admission: RE | Admit: 2018-04-28 | Discharge: 2018-04-28 | Disposition: A | Discharge status: Home / Self Care | Payer: Medicaid Other | Source: Ambulatory Visit | Attending: Physician Assistant | Admitting: Physician Assistant

## 2018-04-28 ENCOUNTER — Encounter (INDEPENDENT_AMBULATORY_CARE_PROVIDER_SITE_OTHER): Payer: Self-pay

## 2018-04-28 ENCOUNTER — Encounter: Payer: Self-pay | Admitting: Physician Assistant

## 2018-04-28 ENCOUNTER — Ambulatory Visit (INDEPENDENT_AMBULATORY_CARE_PROVIDER_SITE_OTHER): Payer: Medicaid Other | Admitting: Physician Assistant

## 2018-04-28 VITALS — BP 100/60 | HR 90 | Ht 71.0 in | Wt 358.1 lb

## 2018-04-28 DIAGNOSIS — I5033 Acute on chronic diastolic (congestive) heart failure: Secondary | ICD-10-CM

## 2018-04-28 DIAGNOSIS — I10 Essential (primary) hypertension: Secondary | ICD-10-CM

## 2018-04-28 DIAGNOSIS — J90 Pleural effusion, not elsewhere classified: Secondary | ICD-10-CM | POA: Diagnosis not present

## 2018-04-28 DIAGNOSIS — I4821 Permanent atrial fibrillation: Secondary | ICD-10-CM

## 2018-04-28 MED ORDER — FUROSEMIDE 20 MG PO TABS: 80.0000 mg | ORAL_TABLET | Freq: Two times a day (BID) | ORAL | 3 refills | Status: DC

## 2018-04-28 MED ORDER — TORSEMIDE 20 MG PO TABS: 80.0000 mg | ORAL_TABLET | Freq: Two times a day (BID) | ORAL | 3 refills | Status: DC

## 2018-04-28 MED ORDER — POTASSIUM CHLORIDE ER 10 MEQ PO TBCR: 60.0000 meq | EXTENDED_RELEASE_TABLET | Freq: Two times a day (BID) | ORAL | 11 refills | Status: DC

## 2018-04-28 NOTE — Patient Instructions (Addendum)
Medication Instructions:  Your physician has recommended you make the following change in your medication:  1. INCREASE TORSEMIDE 20 MG TO 4 TABLETS (80 MG) TWICE DAILY.  2. INCREASE POTASSIUM 10 MEQ TO 6 TABLETS (60 MG) TWICE DAILY.   If you need a refill on your cardiac medications before your next appointment, please call your pharmacy.   Lab work: TODAY: BMET, BNP  TO BE DONE IN 1 WEEK: BMET   If you have labs (blood work) drawn today and your tests are completely normal, you will receive your results only by: Marland Kitchen MyChart Message (if you have MyChart) OR . A paper copy in the mail If you have any lab test that is abnormal or we need to change your treatment, we will call you to review the results.  Testing/Procedures: A chest x-ray takes a picture of the organs and structures inside the chest, including the heart, lungs, and blood vessels. This test can show several things, including, whether the heart is enlarges; whether fluid is building up in the lungs; and whether pacemaker / defibrillator leads are still in place. TO BE DONE AT [facility] AT [place]. [address]   Follow-Up: At [facility], you and your health needs are our priority.  As part of our continuing mission to provide you with exceptional heart care, we have created designated Provider Care Teams.  These Care Teams include your primary Cardiologist (physician) and Advanced Practice Providers (APPs -  Physician Assistants and Nurse Practitioners) who all work together to provide you with the care you need, when you need it. . You will need a follow up appointment in: 1 WEEK.  You may see Dorris Carnes, MD or one of the following Advanced Practice Providers on your designated Care Team: . Richardson Dopp, PA-C . Vin Bhagat, PA-C . Daune Perch, NP  Any Other Special Instructions Will Be Listed Below (If Applicable). PLEASE BE SURE TO DRINK 60 OUNCES OF FLUID OR LESS  IN A DAY.

## 2018-04-28 NOTE — Progress Notes (Signed)
Cardiology Office Note:    Date:  04/28/2018   ID:  Frank Morales, DOB 19-Aug-1960, MRN [id number]  PCP:  [name], MD  Cardiologist:  Dorris Carnes, MD   Electrophysiologist:  None   Referring MD: [name], MD   Chief Complaint  Patient presents with  . Follow-up    CHF    History of Present Illness:    Frank Morales is a 58 y.o. male with permanent AFib/flutter, diastolic CHF, OSA, obesity, substance abuse, HCV. He was admitted in 6/17 with newly diagnosed CHF and atrial flutter. Cardiac catheterization that time demonstrated no significant CAD. Echocardiogram demonstrated EF 50-55%.  He is on Xarelto for anticoagulation.  He underwent DCCV in 7/17 but reverted back to NSR.  He was seen by Roderic Palau, NP in the AFib clinic and rate control strategy was felt to be the most appropriate at that point.  He has had several admissions with decompensated CHF.  He was last seen in clinic in 10/2017 by Leanor Kail, PA-C after an admission 2 months prior.  He was admitted in 01/2018 with decompensated CHF related to dietary indiscretion and medication non-adherence.  EF was normal at that time on echocardiogram.  He was admitted again in 02/2018 with decompensated CHF.  He required thoracentesis for a R sided pleural effusion.  His furosemide was switched to torsemide.     Mr. Gougeon returns for follow up.  He is here alone.  He feels like he is building up with more fluid.  His stomach is swollen.  He sleeps sitting up.  He has had some paroxysmal nocturnal dyspnea.  His legs are more swollen.  He has a cough with whitish sputum production.   He denies chest pain, syncope.  He is limiting his salt.    Prior CV studies:   The following studies were reviewed today:  Echo 01/23/18 Mild LVH, EF 55-60, no RWMA, MAC, mild LAE, mod reduced RVSF, severe RAE, mild TR  Echo 07/04/17 Mod conc LVH, EF 50-55, no evidence of thrombus, mod LAE, PASP 38  LHC 08/21/15 LM  normal LAD irregularities LCx normal RCA normal Angiographically minimal coronary artery disease. Large draping vessels. Several very tortuous Mildly elevated secondary pulmonary hypertension  Echo 08/18/15 Apical HK, moderate focal basal and mild concentric LVH, EF 50-55, diffuse HK, trivial MR, severe BAE, mild TR, PASP 37  Past Medical History:  Diagnosis Date  . Acute on chronic diastolic CHF (congestive heart failure) ([place]) 08/30/2015   A. Echo 6/17: Apical HK, moderate focal basal and mild concentric LVH, EF 50-55, diffuse HK, trivial MR, severe BAE, mild TR, PASP 37  . Atrial fibrillation and flutter ([place]) 08/17/2015   A. S/p failed DCCV // b. Severe BAE on echo >> rate control strategy (has seen AF clinic) // c. Xarelto for anticoag (CHADS2-VASc=2 / CHF, HTN)  . Chronic diastolic CHF (congestive heart failure) ([place]) 08/30/2015   A. Echo 6/17: Apical HK, moderate focal basal and mild concentric LVH, EF 50-55, diffuse HK, trivial MR, severe BAE, mild TR, PASP 37  . Hepatitis C, chronic ([place]) 08/19/2015  . History of cardiac catheterization    a. LHC 6/17: LAD irregs, o/w no CAD  . OSA (obstructive sleep apnea) 11/21/2015  . Sleep apnea   . Tobacco abuse    Surgical Hx: The patient  has a past surgical history that includes No past surgeries; Cardiac catheterization (N/A, 08/21/2015); Cardioversion (N/A, 09/22/2015); Colonoscopy (N/A, 07/19/2016); and Esophagogastroduodenoscopy (N/A, 07/19/2016).  Current Medications: Current Meds  Medication Sig  . azelastine (ASTELIN) 0.1 % nasal spray Place 2 sprays into both nostrils 2 (two) times daily. Use in each nostril as directed up to twice a day.  . diltiazem (CARDIZEM CD) 240 MG 24 hr capsule Take 2 capsules (480 mg total) by mouth daily.  . fexofenadine (ALLEGRA ALLERGY) 60 MG tablet Take 1 tablet (60 mg total) by mouth 2 (two) times daily.  . fluticasone (FLONASE) 50 MCG/ACT nasal spray Place 1 spray into both nostrils daily.  Marland Kitchen loratadine  (CLARITIN) 10 MG tablet Take 1 tablet (10 mg total) by mouth daily.  . rivaroxaban (XARELTO) 20 MG TABS tablet Take 1 tablet (20 mg total) by mouth daily with supper.  . [DISCONTINUED] nicotine (NICODERM CQ - DOSED IN MG/24 HOURS) 14 mg/24hr patch Place 1 patch (14 mg total) onto the skin daily.  . [DISCONTINUED] potassium chloride (K-DUR) 10 MEQ tablet Take 3 (10 mEq) tablets by mouth 2 times a day     Allergies:   Lactose intolerance (gi)   Social History   Tobacco Use  . Smoking status: Current Every Day Smoker    Packs/day: 0.20    Types: Cigarettes  . Smokeless tobacco: Never Used  . Tobacco comment: down to 1-2 cig/day with NRT  Substance Use Topics  . Alcohol use: Yes    Alcohol/week: 5.0 standard drinks    Types: 5 Standard drinks or equivalent per week    Comment: 2 times a week beer  . Drug use: No     Family Hx: The patient's family history includes Diabetes in his mother; Hypertension in his maternal grandfather and mother; Pneumonia in his father. There is no history of Colon cancer.  ROS:   Please see the history of present illness.    Review of Systems  Cardiovascular: Positive for dyspnea on exertion, irregular heartbeat and leg swelling.  Respiratory: Positive for cough, shortness of breath and wheezing.   Musculoskeletal: Positive for back pain and myalgias.   All other systems reviewed and are negative.   EKGs/Labs/Other Test Reviewed:    EKG:  EKG is not ordered today.    Recent Labs: 09/12/2017: NT-Pro BNP 382 01/22/2018: TSH 4.238 01/23/2018: ALT 15 03/14/2018: B Natriuretic Peptide 244.9; Hemoglobin 12.4; Platelets 185 03/17/2018: Magnesium 2.1 04/07/2018: BUN 18; Creatinine, Ser 0.98; Potassium 3.8; Sodium 136   Recent Lipid Panel Lab Results  Component Value Date/Time   CHOL 105 08/17/2015 12:33 PM   TRIG 182 (H) 08/17/2015 12:33 PM   HDL 28 (L) 08/17/2015 12:33 PM   CHOLHDL 3.8 08/17/2015 12:33 PM   LDLCALC 41 08/17/2015 12:33 PM     Physical Exam:    VS:  BP 100/60   Pulse 90   Ht _0  (1.803 m)   Wt (!) 358 lb 1.9 oz (162.4 kg)   SpO2 97%   BMI 49.95 kg/m     Wt Readings from Last 3 Encounters:  04/28/18 (!) 358 lb 1.9 oz (162.4 kg)  04/07/18 (!) 356 lb 1.6 oz (161.5 kg)  03/22/18 (!) 366 lb (166 kg)     Physical Exam  Constitutional: He is oriented to person, place, and time. He appears well-developed and well-nourished. No distress.  HENT:  Head: Atraumatic.  Eyes: No scleral icterus.  Neck: No thyromegaly present.  Cardiovascular: Normal rate. An irregularly irregular rhythm present.  No murmur heard. Pulmonary/Chest: He has no wheezes. He has rales in the right lower field and the left lower field.  Abdominal: He exhibits distension.  Musculoskeletal:        General: Edema (1-2+ bilat LE edema; compression stockings in place) present.  Lymphadenopathy:    He has no cervical adenopathy.  Neurological: He is alert and oriented to person, place, and time.  Skin: Skin is warm and dry.  Psychiatric: He has a normal mood and affect.    ASSESSMENT & PLAN:    Acute on Chronic Diastolic CHF EF 65-68 by Echo in 01/2018.  His exam is difficult.  But, he appears to be volume overloaded.  He also has bibasilar rales on exam.  His chart indicates he was DC'd from the hospital in Dec 2019 on Torsemide 120 mg BID.  However, we called his pharmacy and he has 20 mg tablets.  He is taking 2 pills twice a day (40 mg BID).    -Increase Torsemide to 80 mg Twice daily   -Increase K+ from 30 mEq to 60 mEq Twice daily   -BMET, BNP today  -Obtain CXR today  -BMET 1 week  -Close FU with Dr. Harrington Challenger or me in 1 week  -Question if he would benefit from referral to the AHF Clinic (I will discuss with Dr. Harrington Challenger)  Permanent Atrial Fibrillation Rate is controlled.  He remains on anticoagulation with Rivaroxaban.    Hypertension The patient's blood pressure is controlled on his current regimen.  Continue current  therapy.     Dispo:  Return in about 1 week (around 05/05/2018) for Close Follow Up, w/ Dr. Harrington Challenger, or Richardson Dopp, PA-C.   Medication Adjustments/Labs and Tests Ordered: Current medicines are reviewed at length with the patient today.  Concerns regarding medicines are outlined above.  Tests Ordered: Orders Placed This Encounter  Procedures  . DG Chest 2 View  . Basic metabolic panel  . Pro b natriuretic peptide (BNP)  . Basic metabolic panel   Medication Changes: Meds ordered this encounter  Medications  . DISCONTD: furosemide (LASIX) 20 MG tablet    Sig: Take 4 tablets (80 mg total) by mouth 2 (two) times daily.    Dispense:  720 tablet    Refill:  3  . potassium chloride (K-DUR) 10 MEQ tablet    Sig: Take 6 tablets (60 mEq total) by mouth 2 (two) times daily.    Dispense:  360 tablet    Refill:  11  . torsemide (DEMADEX) 20 MG tablet    Sig: Take 4 tablets (80 mg total) by mouth 2 (two) times daily.    Dispense:  720 tablet    Refill:  3    Signed, Richardson Dopp, PA-C  04/28/2018 5:36 PM    [facility] [place], [place], [place]  [address] Phone: [phone]; Fax: [phone]

## 2018-04-29 LAB — BASIC METABOLIC PANEL
BUN / CREAT RATIO: 11 (ref 9–20)
BUN: 8 mg/dL (ref 6–24)
CO2: 30 mmol/L — ABNORMAL HIGH (ref 20–29)
Calcium: 8.9 mg/dL (ref 8.7–10.2)
Chloride: 91 mmol/L — ABNORMAL LOW (ref 96–106)
Creatinine, Ser: 0.72 mg/dL — ABNORMAL LOW (ref 0.76–1.27)
GFR calc Af Amer: 121 mL/min/{1.73_m2} (ref >59)
GFR calc non Af Amer: 104 mL/min/{1.73_m2} (ref >59)
Glucose: 92 mg/dL (ref 65–99)
Potassium: 4.8 mmol/L (ref 3.5–5.2)
Sodium: 137 mmol/L (ref 134–144)

## 2018-04-29 LAB — PRO B NATRIURETIC PEPTIDE: NT-Pro BNP: 587 pg/mL — ABNORMAL HIGH (ref 0–210)

## 2018-04-30 ENCOUNTER — Other Ambulatory Visit: Payer: Self-pay

## 2018-04-30 ENCOUNTER — Telehealth: Payer: Self-pay | Admitting: Physician Assistant

## 2018-04-30 DIAGNOSIS — J90 Pleural effusion, not elsewhere classified: Secondary | ICD-10-CM

## 2018-04-30 NOTE — Telephone Encounter (Signed)
New Message:       Pt called and said he need to know if he needs to take Furosemide or Torsemide please?

## 2018-04-30 NOTE — Telephone Encounter (Signed)
Per [employer] [place] request.. pt scheduled for Ultrasound Guided R sided Thoracentesis... Pt says that his only transportation day is on Tuesdays... Okay per [employer]... pt scheduled for Tuesday 05/05/18 at 2 pm but will arrive at noon for a stat CXR to evaluate his pleural fluid since we have increased his Torsemide. Central Scheduling for Interventional Radiology.Lacy Duverney and Manuela Schwartz.. [phone]) said okay for pt to stay on his Xarelto... Pt strongly urged to call EMS/ got to the ER if his SOB worsens and he becomes increasingly uncomfortable... Pt says he is doing well now just having increased SOB with exertion but taking it easy.. but agrees not to wait if things worsen.   Cytology of the Pleural Fluid ordered.   Will check with Richardson Dopp re: pt follow up also scheduled for 05/05/18 and with Tuesday being the only day he can come.

## 2018-04-30 NOTE — Telephone Encounter (Signed)
Follow up  ° ° °Patient is returning call.  °

## 2018-04-30 NOTE — Telephone Encounter (Signed)
   He is supposed to be taking Torsemide.  See my recommendations from the office visit on 2.11.20: Acute on Chronic Diastolic CHF EF 51-70 by Echo in 01/2018.  His exam is difficult.  But, he appears to be volume overloaded.  He also has bibasilar rales on exam.  His chart indicates he was DC'd from the hospital in Dec 2019 on Torsemide 120 mg BID.  However, we called his pharmacy and he has 20 mg tablets.  He is taking 2 pills twice a day (40 mg BID).               -Increase Torsemide to 80 mg Twice daily              -Increase K+ from 30 mEq to 60 mEq Twice daily              -BMET, BNP today             -Obtain CXR today             -BMET 1 week             -Close FU with Dr. Harrington Challenger or me in 1 week             -Question if he would benefit from referral to the AHF Clinic (I will discuss with Dr. Harrington Challenger)   Also, please see CXR results.  The patient needs a thoracentesis arranged at [facility]: Notes recorded by Liliane Shi, PA-C on 04/29/2018 at 5:31 PM EST Please call patient.  His CXR shows recurrent R sided pleural effusion. This should be drained again. Recommendations: - Please arrange ultrasound guided R sided thoracentesis with interventional radiology at [facility] - Make sure they know he is on Xarelto. In the past, they have allowed patients to remain on anticoagulation without stopping. If they need him to stop, let me know. - Make sure the pleural fluid is sent for analysis, cytology. If they need me to put orders in for those tests, let me know. - Continue current dose of Torsemide as planned. - Follow up next week as planned - Go to ED if shortness of breath worsens. Richardson Dopp, PA-C   04/29/2018 5:28 PM

## 2018-05-01 ENCOUNTER — Ambulatory Visit (HOSPITAL_COMMUNITY): Payer: Medicaid Other

## 2018-05-01 NOTE — Telephone Encounter (Signed)
Entered in error

## 2018-05-05 ENCOUNTER — Ambulatory Visit (HOSPITAL_COMMUNITY)
Admission: RE | Admit: 2018-05-05 | Discharge: 2018-05-05 | Disposition: A | Discharge status: Home / Self Care | Payer: Medicaid Other | Source: Ambulatory Visit | Attending: Internal Medicine | Admitting: Internal Medicine

## 2018-05-05 ENCOUNTER — Other Ambulatory Visit: Payer: Medicaid Other

## 2018-05-05 ENCOUNTER — Other Ambulatory Visit: Payer: Self-pay | Admitting: Physician Assistant

## 2018-05-05 ENCOUNTER — Ambulatory Visit (HOSPITAL_COMMUNITY)
Admission: RE | Admit: 2018-05-05 | Discharge: 2018-05-05 | Disposition: A | Discharge status: Home / Self Care | Payer: Medicaid Other | Source: Ambulatory Visit | Attending: Physician Assistant | Admitting: Physician Assistant

## 2018-05-05 ENCOUNTER — Encounter: Payer: Self-pay | Admitting: Physician Assistant

## 2018-05-05 ENCOUNTER — Ambulatory Visit (INDEPENDENT_AMBULATORY_CARE_PROVIDER_SITE_OTHER): Payer: Medicaid Other | Admitting: Physician Assistant

## 2018-05-05 VITALS — BP 128/70 | HR 95 | Ht 71.0 in | Wt 352.0 lb

## 2018-05-05 DIAGNOSIS — J90 Pleural effusion, not elsewhere classified: Secondary | ICD-10-CM

## 2018-05-05 DIAGNOSIS — I5033 Acute on chronic diastolic (congestive) heart failure: Secondary | ICD-10-CM | POA: Diagnosis not present

## 2018-05-05 DIAGNOSIS — Z01810 Encounter for preprocedural cardiovascular examination: Secondary | ICD-10-CM | POA: Diagnosis not present

## 2018-05-05 DIAGNOSIS — I4821 Permanent atrial fibrillation: Secondary | ICD-10-CM | POA: Diagnosis not present

## 2018-05-05 DIAGNOSIS — I10 Essential (primary) hypertension: Secondary | ICD-10-CM

## 2018-05-05 MED ORDER — LIDOCAINE HCL 1 % IJ SOLN
INTRAMUSCULAR | Status: AC
  Filled 2018-05-05: qty 20

## 2018-05-05 NOTE — Patient Instructions (Signed)
Medication Instructions:  Your physician recommends that you continue on your current medications as directed. Please refer to the Current Medication list given to you today.  If you need a refill on your cardiac medications before your next appointment, please call your pharmacy.   Lab work: NONE If you have labs (blood work) drawn today and your tests are completely normal, you will receive your results only by: Marland Kitchen MyChart Message (if you have MyChart) OR . A paper copy in the mail If you have any lab test that is abnormal or we need to change your treatment, we will call you to review the results.  Testing/Procedures: NONE  Follow-Up: At [facility], you and your health needs are our priority.  As part of our continuing mission to provide you with exceptional heart care, we have created designated Provider Care Teams.  These Care Teams include your primary Cardiologist (physician) and Advanced Practice Providers (APPs -  Physician Assistants and Nurse Practitioners) who all work together to provide you with the care you need, when you need it. . Your physician recommends that you schedule a follow-up appointment in: 2-3 [place], PA-C .   Any Other Special Instructions Will Be Listed Below (If Applicable).

## 2018-05-05 NOTE — Progress Notes (Signed)
Patient presented for therapeutic thoracentesis today in IR - limited chest US shows small right pleural effusion partially obscured by lung flap. Patient reports recent increase in his Lasix which has improved his breathing and peripheral edema.   Discussed US imaging with patient and decision was made to not proceed with thoracentesis today - instead decision was made to continue to monitor given recent medication changes and if he begins to experience symptoms he will contact ordering provider for repeat thoracentesis.  Please call IR with questions or concerns.  Candiss Norse, PA-C

## 2018-05-05 NOTE — Progress Notes (Signed)
Cardiology Office Note:    Date:  05/05/2018   ID:  Frank Morales, DOB 02/17/1961, MRN [id number]  PCP:  [name], MD  Cardiologist:  Dorris Carnes, MD   Electrophysiologist:  None   Referring MD: [name], MD   Chief Complaint  Patient presents with  . Follow-up    CHF     History of Present Illness:    Frank Morales is a 58 y.o. male with permanent AFib/flutter, diastolic CHF, OSA, obesity,substance abuse, HCV. He was admitted in 6/17 with newly diagnosed CHF and atrial flutter. Cardiac catheterization that time demonstrated no significant CAD. Echocardiogram demonstrated EF 50-55%.He is on Xarelto for anticoagulation. He underwent DCCV in 7/17 but reverted back to NSR. He was seen by Roderic Palau, NPin the AFib clinic and rate control strategy was felt to be the most appropriate at that point. He has had several admissions with decompensated CHF.  He has had several admissions for decompensated CHF.  He was last admitted in Dec 2019 and underwent R thoracentesis (-1.6L).   I saw him last week and he was volume overloaded again.  A CXR showed mod R side effusion.  I increased his diuretic and set him up for a repeat R sided thoracentesis.  His procedure was scheduled for today.  His CXR this AM looked better.  I spoke to the PA with IR.    Mr. Dowty returns for follow up.  He notes that the [place] did not show enough fluid to proceed with thoracentesis.  He feels better.  His leg swelling is improved and he is able to lay flat.  He had some chest pain last night, but otherwise has not had chest pain.  He has a cough. He does need sinus surgery.  Clearance was provided by our Cardiac Clearance Clinic a few weeks ago.    Prior CV studies:   The following studies were reviewed today:  Echo 01/23/18 Mild LVH, EF 55-60, no RWMA, MAC, mild LAE, mod reduced RVSF, severe RAE, mild TR  Echo 07/04/17 Mod conc LVH, EF 50-55, no evidence of thrombus, mod LAE, PASP  38  LHC 08/21/15 LM normal LAD irregularities LCx normal RCA normal Angiographically minimal coronary artery disease. Large draping vessels. Several very tortuous Mildly elevated secondary pulmonary hypertension  Echo 08/18/15 Apical HK, moderate focal basal and mild concentric LVH, EF 50-55, diffuse HK, trivial MR, severe BAE, mild TR, PASP 37  Past Medical History:  Diagnosis Date  . Atrial fibrillation and flutter ([place]) 08/17/2015   A. S/p failed DCCV // b. Severe BAE on echo >> rate control strategy (has seen AF clinic) // c. Xarelto for anticoag (CHADS2-VASc=2 / CHF, HTN)  . Chronic diastolic CHF (congestive heart failure) ([place]) 08/30/2015   A. Echo 6/17: Apical HK, moderate focal basal and mild concentric LVH, EF 50-55, diffuse HK, trivial MR, severe BAE, mild TR, PASP 37  . Hepatitis C, chronic ([place]) 08/19/2015  . History of cardiac catheterization    a. LHC 6/17: LAD irregs, o/w no CAD  . OSA (obstructive sleep apnea) 11/21/2015  . Sleep apnea   . Tobacco abuse    Surgical Hx: The patient  has a past surgical history that includes No past surgeries; Cardiac catheterization (N/A, 08/21/2015); Cardioversion (N/A, 09/22/2015); Colonoscopy (N/A, 07/19/2016); and Esophagogastroduodenoscopy (N/A, 07/19/2016).   Current Medications: Current Meds  Medication Sig  . azelastine (ASTELIN) 0.1 % nasal spray Place 2 sprays into both nostrils 2 (two) times daily. Use  in each nostril as directed up to twice a day.  . diltiazem (CARDIZEM CD) 240 MG 24 hr capsule Take 2 capsules (480 mg total) by mouth daily.  . fexofenadine (ALLEGRA ALLERGY) 60 MG tablet Take 1 tablet (60 mg total) by mouth 2 (two) times daily.  . fluticasone (FLONASE) 50 MCG/ACT nasal spray Place 1 spray into both nostrils daily.  Marland Kitchen loratadine (CLARITIN) 10 MG tablet Take 1 tablet (10 mg total) by mouth daily.  . potassium chloride (K-DUR) 10 MEQ tablet Take 6 tablets (60 mEq total) by mouth 2 (two) times daily.  . rivaroxaban  (XARELTO) 20 MG TABS tablet Take 1 tablet (20 mg total) by mouth daily with supper.  . torsemide (DEMADEX) 20 MG tablet Take 4 tablets (80 mg total) by mouth 2 (two) times daily.     Allergies:   Lactose intolerance (gi)   Social History   Tobacco Use  . Smoking status: Current Every Day Smoker    Packs/day: 0.20    Types: Cigarettes  . Smokeless tobacco: Never Used  . Tobacco comment: down to 1-2 cig/day with NRT  Substance Use Topics  . Alcohol use: Yes    Alcohol/week: 5.0 standard drinks    Types: 5 Standard drinks or equivalent per week    Comment: 2 times a week beer  . Drug use: No     Family Hx: The patient's family history includes Diabetes in his mother; Hypertension in his maternal grandfather and mother; Pneumonia in his father. There is no history of Colon cancer.  ROS:   Please see the history of present illness.    Review of Systems  Cardiovascular: Positive for irregular heartbeat and leg swelling.  Respiratory: Positive for cough, shortness of breath, snoring and wheezing.   Musculoskeletal: Positive for back pain.   All other systems reviewed and are negative.   EKGs/Labs/Other Test Reviewed:    EKG:  EKG is   ordered today.  The ekg ordered today demonstrates atrial fibrillation, HR 95, RAD, inc RBBB, no change from last ECG  Recent Labs: 01/22/2018: TSH 4.238 01/23/2018: ALT 15 03/14/2018: B Natriuretic Peptide 244.9; Hemoglobin 12.4; Platelets 185 03/17/2018: Magnesium 2.1 04/28/2018: BUN 8; Creatinine, Ser 0.72; NT-Pro BNP 587; Potassium 4.8; Sodium 137   Recent Lipid Panel Lab Results  Component Value Date/Time   CHOL 105 08/17/2015 12:33 PM   TRIG 182 (H) 08/17/2015 12:33 PM   HDL 28 (L) 08/17/2015 12:33 PM   CHOLHDL 3.8 08/17/2015 12:33 PM   LDLCALC 41 08/17/2015 12:33 PM    Physical Exam:    VS:  BP 128/70   Pulse 95   Ht 5' 11"  (1.803 m)   Wt (!) 352 lb (159.7 kg)   SpO2 97%   BMI 49.09 kg/m     Wt Readings from Last 3  Encounters:  05/05/18 (!) 352 lb (159.7 kg)  04/28/18 (!) 358 lb 1.9 oz (162.4 kg)  04/07/18 (!) 356 lb 1.6 oz (161.5 kg)     Physical Exam  Constitutional: He is oriented to person, place, and time. He appears well-developed and well-nourished. No distress.  HENT:  Head: Normocephalic and atraumatic.  Eyes: No scleral icterus.  Neck: No thyromegaly present.  Cardiovascular: Normal rate. An irregularly irregular rhythm present.  No murmur heard. Pulmonary/Chest: Effort normal. He has no wheezes. He has rales in the right lower field.  Abdominal: Soft.  Musculoskeletal:        General: Edema (1-2+ bilat LE edema; compression stockings in place)  present.  Lymphadenopathy:    He has no cervical adenopathy.  Neurological: He is alert and oriented to person, place, and time.  Skin: Skin is warm and dry.  Psychiatric: He has a normal mood and affect.    ASSESSMENT & PLAN:    Acute on chronic diastolic CHF (congestive heart failure) (HCC)  Overall improved.  His weigh is down 6 lbs.  He feels somewhat better.  He continues to have significant diuresis with the Torsemide.  I did consider increasing his Torsemide further vs adding Metolazone. But, since he is making some improvement, I have opted to continue his current dose of Torsemide.  BMET has been obtained today.  I will see him again in 2-3 weeks.   Pleural effusion, right  Improved.  Effusion was too small for thoracentesis today.   Permanent atrial fibrillation Rate controlled.  Continue Rivaroxaban, Diltiazem.    Essential hypertension  The patient's blood pressure is controlled on his current regimen.  Continue current therapy.    Preoperative cardiovascular examination   Perioperative risk of major cardiac event according to the Revised Cardiac Risk Index is low at 0.9%.  However, he is still volume overloaded.  Therefore, he will be moderate risk for any procedure.  Our pharmacist has already recommended holding his  Rivaroxaban for 2 days prior to his sinus surgery.  It will need to be resumed as soon as it is safe.  For now, I would hold off on proceeding with surgery until I can see him back in follow up.  If his volume continues to improve, he will be able to proceed.  I will forward my note to his ENT and send my follow up note to his ENT in 2-3 weeks.     Dispo:  Return in about 3 weeks (around 05/26/2018) for Close Follow Up, w/ Richardson Dopp, PA-C.   Medication Adjustments/Labs and Tests Ordered: Current medicines are reviewed at length with the patient today.  Concerns regarding medicines are outlined above.  Tests Ordered: Orders Placed This Encounter  Procedures  . EKG 12-Lead   Medication Changes: No orders of the defined types were placed in this encounter.   Signed, Richardson Dopp, PA-C  05/05/2018 4:00 PM    [facility] [place], [place], [place]  [address] Phone: [phone]; Fax: [phone]

## 2018-05-06 ENCOUNTER — Telehealth: Payer: Self-pay | Admitting: Physician Assistant

## 2018-05-06 LAB — BASIC METABOLIC PANEL
BUN / CREAT RATIO: 18 (ref 9–20)
BUN: 13 mg/dL (ref 6–24)
CO2: 33 mmol/L — ABNORMAL HIGH (ref 20–29)
Calcium: 9.1 mg/dL (ref 8.7–10.2)
Chloride: 90 mmol/L — ABNORMAL LOW (ref 96–106)
Creatinine, Ser: 0.71 mg/dL — ABNORMAL LOW (ref 0.76–1.27)
GFR calc Af Amer: 121 mL/min/{1.73_m2} (ref >59)
GFR calc non Af Amer: 105 mL/min/{1.73_m2} (ref >59)
Glucose: 96 mg/dL (ref 65–99)
Potassium: 4.3 mmol/L (ref 3.5–5.2)
Sodium: 136 mmol/L (ref 134–144)

## 2018-05-06 NOTE — Telephone Encounter (Signed)
Not sure who called the pt. Pt was just recently seen with Richardson Dopp, PA. I will send call to Cherylann Parr CMA for Richardson Dopp, PA.

## 2018-05-06 NOTE — Telephone Encounter (Signed)
New Message:    Patient returning call back. Patient would like to called on [phone]. Please call patient.

## 2018-05-06 NOTE — Telephone Encounter (Signed)
Reviewed results with patient who verbalized understanding. 

## 2018-05-09 DIAGNOSIS — I5032 Chronic diastolic (congestive) heart failure: Secondary | ICD-10-CM | POA: Diagnosis not present

## 2018-05-09 DIAGNOSIS — I509 Heart failure, unspecified: Secondary | ICD-10-CM | POA: Diagnosis not present

## 2018-05-17 DIAGNOSIS — I509 Heart failure, unspecified: Secondary | ICD-10-CM | POA: Diagnosis not present

## 2018-05-17 DIAGNOSIS — I5032 Chronic diastolic (congestive) heart failure: Secondary | ICD-10-CM | POA: Diagnosis not present

## 2018-05-19 ENCOUNTER — Other Ambulatory Visit: Payer: Self-pay

## 2018-05-19 ENCOUNTER — Ambulatory Visit (INDEPENDENT_AMBULATORY_CARE_PROVIDER_SITE_OTHER): Payer: Medicaid Other | Admitting: Physician Assistant

## 2018-05-19 ENCOUNTER — Encounter: Payer: Self-pay | Admitting: Physician Assistant

## 2018-05-19 VITALS — BP 130/60 | HR 90 | Ht 71.0 in | Wt 347.8 lb

## 2018-05-19 DIAGNOSIS — I5032 Chronic diastolic (congestive) heart failure: Secondary | ICD-10-CM

## 2018-05-19 DIAGNOSIS — I4821 Permanent atrial fibrillation: Secondary | ICD-10-CM | POA: Diagnosis not present

## 2018-05-19 DIAGNOSIS — Z01810 Encounter for preprocedural cardiovascular examination: Secondary | ICD-10-CM

## 2018-05-19 DIAGNOSIS — I10 Essential (primary) hypertension: Secondary | ICD-10-CM | POA: Diagnosis not present

## 2018-05-19 DIAGNOSIS — R0981 Nasal congestion: Secondary | ICD-10-CM

## 2018-05-19 MED ORDER — FLUTICASONE PROPIONATE 50 MCG/ACT NA SUSP: 1.0000 | Freq: Every day | NASAL | 2 refills | Status: DC

## 2018-05-19 NOTE — Progress Notes (Signed)
Cardiology Office Note:    Date:  05/19/2018   ID:  Frank Morales, DOB 01-Apr-1960, MRN [id number]  PCP:  Frank Noble, MD  Cardiologist:  Frank Carnes, MD   Electrophysiologist:  None   Referring MD: Frank Noble, MD   Chief Complaint  Patient presents with  . Follow-up    CHF     History of Present Illness:    Frank Morales is a 58 y.o. male with permanentAFib/flutter, diastolic CHF, OSA, obesity,substance abuse, HCV. He was admitted in 6/17 with newly diagnosed CHF and atrial flutter. Cardiac catheterization that time demonstrated no significant CAD. Echocardiogram demonstrated EF 50-55%.He is on Xarelto for anticoagulation. He underwent DCCV in 7/17 but reverted back to NSR.  Therefore, he has been treated with a rate control strategy.  He has had several admissions for decompensated CHF.  He was last admitted in Dec 2019 and underwent R thoracentesis (-1.6L).   I saw him recently and a CXR showed recurrent, mod R pleural effusion.  I arranged a repeat thoracentesis, but his effusion improved with improved diuresis.  Therefore, he did not require repeat thoracentesis.  I last saw him 05/05/2018.     [name] returns for follow up.  He is here alone.  His weight is down 11 lbs since 04/28/2018.  He feels his breathing is about the same.  His leg swelling has improved.  He has not had chest pain, syncope, bleeding issues.  He is trying to avoid salt.   Prior CV studies:   The following studies were reviewed today:  Echo 01/23/18 Mild LVH, EF 55-60, no RWMA, MAC,mild LAE, mod reduced RVSF, severe RAE, mild TR  Echo 07/04/17 Mod conc LVH, EF 50-55, no evidence of thrombus, mod LAE, PASP 38  LHC 08/21/15 LM normal LAD irregularities LCx normal RCA normal Angiographically minimal coronary artery disease. Large draping vessels. Several very tortuous Mildly elevated secondary pulmonary hypertension  Echo 08/18/15 Apical HK, moderate focal basal and mild  concentric LVH, EF 50-55, diffuse HK, trivial MR, severe BAE, mild TR, PASP 37  Past Medical History:  Diagnosis Date  . Atrial fibrillation and flutter ([place]) 08/17/2015   A. S/p failed DCCV // b. Severe BAE on echo >> rate control strategy (has seen AF clinic) // c. Xarelto for anticoag (CHADS2-VASc=2 / CHF, HTN)  . Chronic diastolic CHF (congestive heart failure) ([place]) 08/30/2015   A. Echo 6/17: Apical HK, moderate focal basal and mild concentric LVH, EF 50-55, diffuse HK, trivial MR, severe BAE, mild TR, PASP 37  . Hepatitis C, chronic ([place]) 08/19/2015  . History of cardiac catheterization    a. LHC 6/17: LAD irregs, o/w no CAD  . OSA (obstructive sleep apnea) 11/21/2015  . Sleep apnea   . Tobacco abuse    Surgical Hx: The patient  has a past surgical history that includes No past surgeries; Cardiac catheterization (N/A, 08/21/2015); Cardioversion (N/A, 09/22/2015); Colonoscopy (N/A, 07/19/2016); and Esophagogastroduodenoscopy (N/A, 07/19/2016).   Current Medications: Current Meds  Medication Sig  . azelastine (ASTELIN) 0.1 % nasal spray Place 2 sprays into both nostrils 2 (two) times daily. Use in each nostril as directed up to twice a day.  . diltiazem (CARDIZEM CD) 240 MG 24 hr capsule Take 2 capsules (480 mg total) by mouth daily.  . fexofenadine (ALLEGRA ALLERGY) 60 MG tablet Take 1 tablet (60 mg total) by mouth 2 (two) times daily.  . fluticasone (FLONASE) 50 MCG/ACT nasal spray Place 1 spray into both nostrils  daily.  . furosemide (LASIX) 20 MG tablet TK 4 TS PO BID  . loratadine (CLARITIN) 10 MG tablet Take 1 tablet (10 mg total) by mouth daily.  . potassium chloride (K-DUR) 10 MEQ tablet Take 6 tablets (60 mEq total) by mouth 2 (two) times daily.  . rivaroxaban (XARELTO) 20 MG TABS tablet Take 1 tablet (20 mg total) by mouth daily with supper.  . torsemide (DEMADEX) 20 MG tablet Take 4 tablets (80 mg total) by mouth 2 (two) times daily.     Allergies:   Lactose intolerance (gi)    Social History   Tobacco Use  . Smoking status: Current Every Day Smoker    Packs/day: 0.20    Types: Cigarettes  . Smokeless tobacco: Never Used  . Tobacco comment: down to 1-2 cig/day with NRT  Substance Use Topics  . Alcohol use: Yes    Alcohol/week: 5.0 standard drinks    Types: 5 Standard drinks or equivalent per week    Comment: 2 times a week beer  . Drug use: No     Family Hx: The patient's family history includes Diabetes in his mother; Hypertension in his maternal grandfather and mother; Pneumonia in his father. There is no history of Colon cancer.  ROS:   Please see the history of present illness.    Review of Systems  Cardiovascular: Positive for irregular heartbeat and leg swelling.  Respiratory: Positive for cough, shortness of breath and wheezing.    All other systems reviewed and are negative.   EKGs/Labs/Other Test Reviewed:    EKG:  EKG is not ordered today.    Recent Labs: 01/22/2018: TSH 4.238 01/23/2018: ALT 15 03/14/2018: B Natriuretic Peptide 244.9; Hemoglobin 12.4; Platelets 185 03/17/2018: Magnesium 2.1 04/28/2018: NT-Pro BNP 587 05/05/2018: BUN 13; Creatinine, Ser 0.71; Potassium 4.3; Sodium 136   Recent Lipid Panel Lab Results  Component Value Date/Time   CHOL 105 08/17/2015 12:33 PM   TRIG 182 (H) 08/17/2015 12:33 PM   HDL 28 (L) 08/17/2015 12:33 PM   CHOLHDL 3.8 08/17/2015 12:33 PM   LDLCALC 41 08/17/2015 12:33 PM    Physical Exam:    VS:  BP 130/60   Pulse 90   Ht _0  (1.803 m)   Wt (!) 347 lb 12.8 oz (157.8 kg)   SpO2 97%   BMI 48.51 kg/m     Wt Readings from Last 3 Encounters:  05/19/18 (!) 347 lb 12.8 oz (157.8 kg)  05/05/18 (!) 352 lb (159.7 kg)  04/28/18 (!) 358 lb 1.9 oz (162.4 kg)     Physical Exam  Constitutional: He is oriented to person, place, and time. He appears well-developed and well-nourished. No distress.  HENT:  Head: Normocephalic and atraumatic.  Eyes: No scleral icterus.  Neck: No thyromegaly  present.  Cardiovascular: Normal rate. An irregularly irregular rhythm present.  No murmur heard. Pulmonary/Chest: He has no wheezes. He has rales (very faint) in the right lower field.  Abdominal: He exhibits distension.  Musculoskeletal:        General: Edema (tight 2+ bilat LE edema) present.  Lymphadenopathy:    He has no cervical adenopathy.  Neurological: He is alert and oriented to person, place, and time.  Skin: Skin is warm and dry.  Psychiatric: He has a normal mood and affect.    ASSESSMENT & PLAN:    Chronic diastolic congestive heart failure ([place]) EF 55-60 by Echo in 01/2018.  Volume is improving.  He has a long way to go.  His current weight is lower than his weight at DC in 03/2018.  I considered increasing his Torsemide to 100 mg twice daily.  But he is improving on his current dose.  Therefore, I will continue his current regimen.  He will FU with me in 1 month.   Permanent atrial fibrillation Rate is controlled. Continue Rivaroxaban for anticoagulation.    Essential hypertension The patient's blood pressure is controlled on his current regimen.  Continue current therapy.    Preoperative cardiovascular examination   Perioperative risk of major cardiac event according to the Revised Cardiac Risk Index is low at 0.9%.  However, he is a moderate risk for CV complications due to his CHF.  Overall, he seems to be stable from a volume standpoint.  He may proceed with his sinus surgery.  Caution should be used to avoid volume overload.  He can hold his Rivaroxaban for 2 days prior to surgery and resume it as soon as it is safe.     Dispo:  Return in about 4 weeks (around 06/16/2018) for Routine Follow Up, w/ Richardson Dopp, PA-C.   Medication Adjustments/Labs and Tests Ordered: Current medicines are reviewed at length with the patient today.  Concerns regarding medicines are outlined above.  Tests Ordered: No orders of the defined types were placed in this  encounter.  Medication Changes: No orders of the defined types were placed in this encounter.   Signed, Richardson Dopp, PA-C  05/19/2018 12:31 PM    [facility] [address], [place], [place]  [address] Phone: [phone]; Fax: [phone]

## 2018-05-19 NOTE — Telephone Encounter (Signed)
fluticasone (FLONASE) 50 MCG/ACT nasal spray   Refill request @  [facility] [phone] - [place], [address] AT [place] [phone] (Phone) [phone] (Fax)

## 2018-05-19 NOTE — Patient Instructions (Signed)
Medication Instructions:  No changes.   If you need a refill on your cardiac medications before your next appointment, please call your pharmacy.   Lab work: None   If you have labs (blood work) drawn today and your tests are completely normal, you will receive your results only by: Marland Kitchen MyChart Message (if you have MyChart) OR . A paper copy in the mail If you have any lab test that is abnormal or we need to change your treatment, we will call you to review the results.  Testing/Procedures: None   Follow-Up: At [facility], you and your health needs are our priority.  As part of our continuing mission to provide you with exceptional heart care, we have created designated Provider Care Teams.  These Care Teams include your primary Cardiologist (physician) and Advanced Practice Providers (APPs -  Physician Assistants and Nurse Practitioners) who all work together to provide you with the care you need, when you need it. Richardson Dopp, PA-C in 1 month.  Any Other Special Instructions Will Be Listed Below (If Applicable).

## 2018-06-07 DIAGNOSIS — I5032 Chronic diastolic (congestive) heart failure: Secondary | ICD-10-CM | POA: Diagnosis not present

## 2018-06-07 DIAGNOSIS — I509 Heart failure, unspecified: Secondary | ICD-10-CM | POA: Diagnosis not present

## 2018-06-11 ENCOUNTER — Other Ambulatory Visit: Payer: Self-pay | Admitting: Internal Medicine

## 2018-06-11 DIAGNOSIS — R0981 Nasal congestion: Secondary | ICD-10-CM

## 2018-06-11 NOTE — Telephone Encounter (Signed)
Refill Request   fexofenadine (ALLEGRA ALLERGY) 60 MG tablet   Pt has questions about his dosage on the following medication listed below:  diltiazem (CARDIZEM CD) 240 MG 24 hr capsule

## 2018-06-11 NOTE — Telephone Encounter (Signed)
Pt states pharmacy filled rx for diltiazem 360mg  on 3/19, but his current dose is diltiazem 240 mg take 2 caps daily.  Call made to pharmacy([facility])--pt was given 360 mg capsules.  Additional refills need to be authorized on the diltiazem 240mg .  Will send to pcp for review.[name]3/26/20203:18 PM

## 2018-06-12 ENCOUNTER — Encounter: Payer: Medicaid Other | Admitting: Internal Medicine

## 2018-06-12 MED ORDER — FEXOFENADINE HCL 60 MG PO TABS: 60.0000 mg | ORAL_TABLET | Freq: Two times a day (BID) | ORAL | 3 refills | Status: DC

## 2018-06-12 MED ORDER — DILTIAZEM HCL ER COATED BEADS 240 MG PO CP24: 480.0000 mg | ORAL_CAPSULE | Freq: Every day | ORAL | 0 refills | Status: DC

## 2018-06-12 NOTE — Telephone Encounter (Signed)
He should be on the diltiazem 480 mg daily, (2 of the 240 mg tablets). I contacted patient and he reports that he is feeling well. He was aware of the error and I told him that I would send in the correct prescriptions.

## 2018-06-17 DIAGNOSIS — I509 Heart failure, unspecified: Secondary | ICD-10-CM | POA: Diagnosis not present

## 2018-06-17 DIAGNOSIS — I5032 Chronic diastolic (congestive) heart failure: Secondary | ICD-10-CM | POA: Diagnosis not present

## 2018-06-19 ENCOUNTER — Telehealth: Payer: Self-pay | Admitting: *Deleted

## 2018-06-19 NOTE — Telephone Encounter (Addendum)
Call to [employer] for PA for Fexofendine 60 mg tablets .  Awaiting determination within 24 hours.  [place] [phone].  I [id number].   Sander Nephew, RN 06/19/2018 3:49 PM.  Fexofenadine 60 mg tablets was denied patient will need to try and fail  Certizine, Loratadine or Levocetirizine.  Message to be sent to Dr. Sherry Ruffing to consider a change to a preferred medication.  Sander Nephew, RN 07/06/2018 2:06 PM.

## 2018-07-01 ENCOUNTER — Telehealth: Payer: Self-pay | Admitting: *Deleted

## 2018-07-01 NOTE — Telephone Encounter (Signed)
SPOKE WITH PT TO CHANGE APPT TO A TELEHEALTH VISIT  PT STATED  THEY WOULD BE INTERESTED IN A PHONE VISIT   BUT THEY DO NOT HAVE AN ELECTRICAL BLOOD PRESSURE CUFF TO OBTAIN THEIR VITALS   PT WAS ASKED SOB CP LIGHTHEADEDNESS OR DIZZINESS  PT STATED NO C/O OF ALL EXCEPT DIZZINESS. PT STATED HE HAS TO RECENT DIZZY EPISODES WHERE HE HAD TO SIT DOWN DUE TO BLURRY VISION.  PT IS TAKING ALL CURRENT MEDICATIONS CORRECTLY. PT SAYS ITS SOMETHING NE THAT HAS BEEN HAPPENING   PT WAS TOLD  THAT PROVIDER WOULD BE CONTACTED TO DETERMINED THE APPROPRIATE RESCHEDULING OF HIS APPT.

## 2018-07-01 NOTE — Telephone Encounter (Signed)
He can do a telehealth visit. If he has a smartphone, will do doximity. Richardson Dopp, PA-C    07/01/2018 4:51 PM

## 2018-07-02 ENCOUNTER — Telehealth: Payer: Self-pay | Admitting: Physician Assistant

## 2018-07-02 ENCOUNTER — Telehealth: Payer: Self-pay | Admitting: *Deleted

## 2018-07-02 NOTE — Telephone Encounter (Signed)
LVMOM TO CALL BACK TO DISCUSS CHANGE OF APPT TO TELEHEALTH

## 2018-07-02 NOTE — Telephone Encounter (Signed)
New Message ° ° ° °Pt is returning call  ° ° ° °Please call back  °

## 2018-07-02 NOTE — Telephone Encounter (Signed)
SPOKE WITH PT AN PT CONSENT TO TELEPHONE        Virtual Visit Pre-Appointment Phone Call   TELEPHONE CALL NOTE  Frank Morales has been deemed a candidate for a follow-up tele-health visit to limit community exposure during the Covid-19 pandemic. I spoke with the patient via phone to ensure availability of phone/video source, confirm preferred email & phone number, and discuss instructions and expectations.  I reminded Frank Morales to be prepared with any vital sign and/or heart rhythm information that could potentially be obtained via home monitoring, at the time of his visit. I reminded Frank Morales to expect a phone call at the time of his visit if his visit.  Frank Morales, [place] 07/02/2018 4:43 PM    Confirm consent - "In the setting of the current Covid19 crisis, you are scheduled for a (phone or video) visit with your provider on (date) at (time).  Just as we do with many in-office visits, in order for you to participate in this visit, we must obtain consent.  If you'd like, I can send this to your mychart (if signed up) or email for you to review.  Otherwise, I can obtain your verbal consent now.  All virtual visits are billed to your insurance company just like a normal visit would be.  By agreeing to a virtual visit, we'd like you to understand that the technology does not allow for your provider to perform an examination, and thus may limit your provider's ability to fully assess your condition.  Finally, though the technology is pretty good, we cannot assure that it will always work on either your or our end, and in the setting of a video visit, we may have to convert it to a phone-only visit.  In either situation, we cannot ensure that we have a secure connection.  Are you willing to proceed?" STAFF: Did the patient verbally acknowledge consent to telehealth visit? Document YES/NO here:  YES      FULL LENGTH CONSENT FOR TELE-HEALTH VISIT   I hereby voluntarily  request, consent and authorize [facility] and its employed or contracted physicians, physician assistants, nurse practitioners or other licensed health care professionals (the Practitioner), to provide me with telemedicine health care services (the "Services") as deemed necessary by the treating Practitioner. I acknowledge and consent to receive the Services by the Practitioner via telemedicine. I understand that the telemedicine visit will involve communicating with the Practitioner through live audiovisual communication technology and the disclosure of certain medical information by electronic transmission. I acknowledge that I have been given the opportunity to request an in-person assessment or other available alternative prior to the telemedicine visit and am voluntarily participating in the telemedicine visit.  I understand that I have the right to withhold or withdraw my consent to the use of telemedicine in the course of my care at any time, without affecting my right to future care or treatment, and that the Practitioner or I may terminate the telemedicine visit at any time. I understand that I have the right to inspect all information obtained and/or recorded in the course of the telemedicine visit and may receive copies of available information for a reasonable fee.  I understand that some of the potential risks of receiving the Services via telemedicine include:  Marland Kitchen Delay or interruption in medical evaluation due to technological equipment failure or disruption; . Information transmitted may not be sufficient (e.g. poor resolution of images) to allow for appropriate medical decision making by  the Practitioner; and/or  . In rare instances, security protocols could fail, causing a breach of personal health information.  Furthermore, I acknowledge that it is my responsibility to provide information about my medical history, conditions and care that is complete and accurate to the best of my  ability. I acknowledge that Practitioner's advice, recommendations, and/or decision may be based on factors not within their control, such as incomplete or inaccurate data provided by me or distortions of diagnostic images or specimens that may result from electronic transmissions. I understand that the practice of medicine is not an exact science and that Practitioner makes no warranties or guarantees regarding treatment outcomes. I acknowledge that I will receive a copy of this consent concurrently upon execution via email to the email address I last provided but may also request a printed copy by calling the office of [place].    I understand that my insurance will be billed for this visit.   I have read or had this consent read to me. . I understand the contents of this consent, which adequately explains the benefits and risks of the Services being provided via telemedicine.  . I have been provided ample opportunity to ask questions regarding this consent and the Services and have had my questions answered to my satisfaction. . I give my informed consent for the services to be provided through the use of telemedicine in my medical care  By participating in this telemedicine visit I agree to the above.

## 2018-07-06 NOTE — Progress Notes (Signed)
Virtual Visit via Telephone Note   This visit type was conducted due to national recommendations for restrictions regarding the COVID-19 Pandemic (e.g. social distancing) in an effort to limit this patient's exposure and mitigate transmission in our community.  Due to his co-morbid illnesses, this patient is at least at moderate risk for complications without adequate follow up.  This format is felt to be most appropriate for this patient at this time.  The patient did not have access to video technology/had technical difficulties with video requiring transitioning to audio format only (telephone).  All issues noted in this document were discussed and addressed.  No physical exam could be performed with this format.  Please refer to the patient's chart for his  consent to telehealth for [facility].   Evaluation Performed:  Follow-up visit  Date:  07/07/2018   ID:  Frank, Morales 1960-10-12, MRN [id number]  Patient Location: Home Provider Location: Home  PCP:  [name], MD  Cardiologist:  Dorris Carnes, MD   Electrophysiologist:  None   Chief Complaint: Follow-up on atrial fibrillation and congestive heart failure  History of Present Illness:    Frank Morales is a 58 y.o. male with permanentAFib/flutter, diastolic CHF, OSA, obesity,substance abuse, HCV. He was admitted in 6/17 with newly diagnosed CHF and atrial flutter. Cardiac catheterization at that time demonstrated no significant CAD. Echocardiogram demonstrated EF 50-55%.He is on Xarelto for anticoagulation. He underwent DCCV in 7/17 but reverted back to NSR.  Therefore, he has been treated with a rate control strategy.  He has had several admissions for decompensated CHF. He was last admitted in Dec 2019 and underwent R thoracentesis (-1.6L).  I saw him in Feb 2020 and a CXR showed recurrent, mod R pleural effusion.  I arranged a repeat thoracentesis, but his effusion improved with improved diuresis.   Therefore, he did not require repeat thoracentesis.  He was last seen in 05/2018.    Today, he notes that he is doing well.  His breathing is about the same.  He has not had orthopnea.  His leg swelling has gone down.  He has not had chest pain or syncope.,  He has had a couple of occasions of dizziness.  He has a hard time with his breathing when it is hot outside.  He was able to give me several blood pressure readings.  These ranged from 131/110-99/58.  He has not had any bleeding issues.  The patient does not have symptoms concerning for COVID-19 infection (fever, chills, cough, or new shortness of breath).    Past Medical History:  Diagnosis Date   Atrial fibrillation and flutter ([place]) 08/17/2015   A. S/p failed DCCV // b. Severe BAE on echo >> rate control strategy (has seen AF clinic) // c. Xarelto for anticoag (CHADS2-VASc=2 / CHF, HTN)   Chronic diastolic CHF (congestive heart failure) ([place]) 08/30/2015   A. Echo 6/17: Apical HK, moderate focal basal and mild concentric LVH, EF 50-55, diffuse HK, trivial MR, severe BAE, mild TR, PASP 37   Hepatitis C, chronic (HCC) 08/19/2015   History of cardiac catheterization    a. LHC 6/17: LAD irregs, o/w no CAD   OSA (obstructive sleep apnea) 11/21/2015   Sleep apnea    Tobacco abuse    Past Surgical History:  Procedure Laterality Date   CARDIAC CATHETERIZATION N/A 08/21/2015   Procedure: Right/Left Heart Cath and Coronary Angiography;  Surgeon: Leonie Man, MD;  Location: [facility];  Service: Cardiovascular;  Laterality: N/A;   CARDIOVERSION N/A 09/22/2015   Procedure: CARDIOVERSION;  Surgeon: Josue Hector, MD;  Location: [facility] ENDOSCOPY;  Service: Cardiovascular;  Laterality: N/A;   COLONOSCOPY N/A 07/19/2016   Procedure: COLONOSCOPY;  Surgeon: Doran Stabler, MD;  Location: [facility] ENDOSCOPY;  Service: Gastroenterology;  Laterality: N/A;   ESOPHAGOGASTRODUODENOSCOPY N/A 07/19/2016   Procedure: ESOPHAGOGASTRODUODENOSCOPY (EGD);   Surgeon: Doran Stabler, MD;  Location: [facility];  Service: Gastroenterology;  Laterality: N/A;   NO PAST SURGERIES       Current Meds  Medication Sig   azelastine (ASTELIN) 0.1 % nasal spray Place 2 sprays into both nostrils 2 (two) times daily. Use in each nostril as directed up to twice a day.   diltiazem (CARDIZEM CD) 240 MG 24 hr capsule Take 2 capsules (480 mg total) by mouth daily.   fluticasone (FLONASE) 50 MCG/ACT nasal spray Place 1 spray into both nostrils daily.   loratadine (CLARITIN) 10 MG tablet Take 1 tablet (10 mg total) by mouth daily.   potassium chloride (K-DUR) 10 MEQ tablet Take 6 tablets (60 mEq total) by mouth 2 (two) times daily.   rivaroxaban (XARELTO) 20 MG TABS tablet Take 1 tablet (20 mg total) by mouth daily with supper.   torsemide (DEMADEX) 20 MG tablet Take 4 tablets (80 mg total) by mouth 2 (two) times daily.     Allergies:   Lactose intolerance (gi)   Social History   Tobacco Use   Smoking status: Current Every Day Smoker    Packs/day: 0.20    Types: Cigarettes   Smokeless tobacco: Never Used   Tobacco comment: down to 1-2 cig/day with NRT  Substance Use Topics   Alcohol use: Yes    Alcohol/week: 5.0 standard drinks    Types: 5 Standard drinks or equivalent per week    Comment: 2 times a week beer   Drug use: No     Family Hx: The patient's family history includes Diabetes in his mother; Hypertension in his maternal grandfather and mother; Pneumonia in his father. There is no history of Colon cancer.  ROS:   Please see the history of present illness.    All other systems reviewed and are negative.   Prior CV studies:   The following studies were reviewed today:  Echo 01/23/18 Mild LVH, EF 55-60, no RWMA, MAC, mild LAE, mod reduced RVSF, severe RAE, mild TR   Echo 07/04/17 Mod conc LVH, EF 50-55, no evidence of thrombus, mod LAE, PASP 38   LHC 08/21/15 LM normal LAD irregularities LCx normal RCA  normal Angiographically minimal coronary artery disease. Large draping vessels. Several very tortuous Mildly elevated secondary pulmonary hypertension   Echo 08/18/15 Apical HK, moderate focal basal and mild concentric LVH, EF 50-55, diffuse HK, trivial MR, severe BAE, mild TR, PASP 37     Labs/Other Tests and Data Reviewed:    EKG:  No ECG reviewed.  Recent Labs: 01/22/2018: TSH 4.238 01/23/2018: ALT 15 03/14/2018: B Natriuretic Peptide 244.9; Hemoglobin 12.4; Platelets 185 03/17/2018: Magnesium 2.1 04/28/2018: NT-Pro BNP 587 05/05/2018: BUN 13; Creatinine, Ser 0.71; Potassium 4.3; Sodium 136   Recent Lipid Panel Lab Results  Component Value Date/Time   CHOL 105 08/17/2015 12:33 PM   TRIG 182 (H) 08/17/2015 12:33 PM   HDL 28 (L) 08/17/2015 12:33 PM   CHOLHDL 3.8 08/17/2015 12:33 PM   LDLCALC 41 08/17/2015 12:33 PM    Wt Readings from Last 3 Encounters:  07/07/18 (!) 339  lb 6.4 oz (154 kg)  05/19/18 (!) 347 lb 12.8 oz (157.8 kg)  05/05/18 (!) 352 lb (159.7 kg)     Objective:    Vital Signs:  BP 129/63    Pulse 86    Ht _0  (1.803 m)    Wt (!) 339 lb 6.4 oz (154 kg)    BMI 47.34 kg/m    VITAL SIGNS:  reviewed RESPIRATORY:  No labored breathing noted during our conversation NEURO:  He seems to be alert and oriented PSYCH:  He seems to be in good spirits  ASSESSMENT & PLAN:    Chronic diastolic CHF (congestive heart failure) ([place]) EF 55-60 by echo November 2019.  Given the limitations of audio only telehealth visits, he seems to be fairly stable.  His weight has come down even more since I last saw him.  I will try to obtain a blood pressure cuff for him through our [facility].  I will also try to arrange a visit from [facility] to do medication reconciliation, a brief exam, obtain a 1-lead rhythm strip and obtain a blood pressure reading.  He also notes difficulty with obtaining meals at times.  We will try to arrange meal assistance through our Heart  [facility] Vascular [facility].  Follow-up in 3 months.  Permanent atrial fibrillation Overall, he seems to be fairly stable.  Continue rivaroxaban for anticoagulation.  Essential hypertension Overall, his blood pressure seems to be well controlled.  His pressure has been low at times and he has noted some dizziness.  As noted, we will try to obtain a blood pressure cuff for him and arrange a visit to his home to check his blood pressure.  If his blood pressure tends to run too low, we may need to cut back on his torsemide and/or diltiazem.  COVID-19 Education: The signs and symptoms of COVID-19 were discussed with the patient and how to seek care for testing (follow up with PCP or arrange E-visit).  The importance of social distancing was discussed today.  Time:   Today, I have spent 15 minutes with the patient with telehealth technology discussing the above problems.     Medication Adjustments/Labs and Tests Ordered: Current medicines are reviewed at length with the patient today.  Concerns regarding medicines are outlined above.   Tests Ordered: Orders Placed This Encounter  Procedures   Ambulatory referral to Cardiology    Medication Changes: No orders of the defined types were placed in this encounter.   Disposition:  Follow up in 3 month(s)  Signed, Richardson Dopp, PA-C  07/07/2018 1:17 PM    [facility]

## 2018-07-07 ENCOUNTER — Encounter: Payer: Self-pay | Admitting: Physician Assistant

## 2018-07-07 ENCOUNTER — Other Ambulatory Visit: Payer: Self-pay

## 2018-07-07 ENCOUNTER — Telehealth (INDEPENDENT_AMBULATORY_CARE_PROVIDER_SITE_OTHER): Payer: Medicaid Other | Admitting: Physician Assistant

## 2018-07-07 ENCOUNTER — Telehealth: Payer: Self-pay | Admitting: Licensed Clinical Social Worker

## 2018-07-07 VITALS — BP 129/63 | HR 86 | Ht 71.0 in | Wt 339.4 lb

## 2018-07-07 DIAGNOSIS — Z7189 Other specified counseling: Secondary | ICD-10-CM

## 2018-07-07 DIAGNOSIS — I5032 Chronic diastolic (congestive) heart failure: Secondary | ICD-10-CM | POA: Diagnosis not present

## 2018-07-07 DIAGNOSIS — I4821 Permanent atrial fibrillation: Secondary | ICD-10-CM

## 2018-07-07 DIAGNOSIS — I10 Essential (primary) hypertension: Secondary | ICD-10-CM

## 2018-07-07 NOTE — Patient Instructions (Addendum)
Medication Instructions:  No changes.  If you need a refill on your cardiac medications before your next appointment, please call your pharmacy.   Lab work: None   If you have labs (blood work) drawn today and your tests are completely normal, you will receive your results only by: Marland Kitchen MyChart Message (if you have MyChart) OR . A paper copy in the mail If you have any lab test that is abnormal or we need to change your treatment, we will call you to review the results.  Testing/Procedures: None   Follow-Up: At [facility], you and your health needs are our priority.  As part of our continuing mission to provide you with exceptional heart care, we have created designated Provider Care Teams.  These Care Teams include your primary Cardiologist (physician) and Advanced Practice Providers (APPs -  Physician Assistants and Nurse Practitioners) who all work together to provide you with the care you need, when you need it. You will need a follow up appointment in:  3 months.  Please call our office 2 months in advance to schedule this appointment.  You may see Dorris Carnes, MD or Richardson Dopp, PA-C   Any Other Special Instructions Will Be Listed Below (If Applicable).  1. We will arrange a home health visit with [facility] to check your blood pressure, look at your medications, examine you and get a rhythm strip.  Someone will contact you.  2. I have asked for you to get a blood pressure cuff and meal assistance.  Someone will contact you regarding this.

## 2018-07-07 NOTE — Telephone Encounter (Signed)
CSW referred to contact patient to inquire about food insecurity during this health crisis and assist with a BP cuff for home delivery.  Patient reported need and agreeable to weekly delivery from [employer]. Patient informed that delivery will be left on front door with no face to face contact with delivery person. BP cuff will be ordered for home delivery by the end of the week.Patient agreeable to plan and grateful for the assistance. Raquel Sarna, [place], [place]

## 2018-07-07 NOTE — Telephone Encounter (Signed)
Hi Gladys,   He has tried loratadine in the past and had already failed it. Does he need to try the other ones too? If so we can do the cetirizine to see if that helps with his allergies.  Thanks!  Lonia Skinner

## 2018-07-08 DIAGNOSIS — I5032 Chronic diastolic (congestive) heart failure: Secondary | ICD-10-CM | POA: Diagnosis not present

## 2018-07-08 DIAGNOSIS — I509 Heart failure, unspecified: Secondary | ICD-10-CM | POA: Diagnosis not present

## 2018-07-10 NOTE — Telephone Encounter (Signed)
Yes see if he will try.

## 2018-07-15 ENCOUNTER — Telehealth: Payer: Self-pay | Admitting: Licensed Clinical Social Worker

## 2018-07-15 NOTE — Telephone Encounter (Signed)
CSW contacted patient to follow up on weekly food delivery package. Patient informed of delivery time and no face to face contact during delivery. Patient verbalizes understanding and grateful for the assistance.  CSW continues to follow for supportive needs. Jackie Delio Slates, LCSW, CCSW-MCS [phone]  

## 2018-07-17 DIAGNOSIS — I5032 Chronic diastolic (congestive) heart failure: Secondary | ICD-10-CM | POA: Diagnosis not present

## 2018-07-17 DIAGNOSIS — I509 Heart failure, unspecified: Secondary | ICD-10-CM | POA: Diagnosis not present

## 2018-07-22 ENCOUNTER — Telehealth: Payer: Self-pay | Admitting: Licensed Clinical Social Worker

## 2018-07-22 NOTE — Telephone Encounter (Signed)
CSW contacted patient to follow up on weekly food delivery package. Patient informed of delivery time and no face to face contact during delivery. Patient verbalizes understanding and grateful for the assistance.  CSW continues to follow for supportive needs. Jackie Okie Jansson, LCSW, CCSW-MCS [phone]  

## 2018-07-23 ENCOUNTER — Telehealth: Payer: Self-pay | Admitting: Physician Assistant

## 2018-07-23 NOTE — Telephone Encounter (Signed)
[place] Visit Request  Agency Requested:  [facility] Contact:  Darnell Level, NP Phone #:  [phone] Fax #:  [phone]  Patient Information: Name:  Frank Morales  DOB:  1960-04-14  MRN:  [id number]  Address:   [address] [place] [address]  Phone Numbers:   Home Phone [phone]  Mobile [phone]     Requesting Provider:   Richardson Dopp, PA-C   Diagnosis:   CHF  Reason for Visit:   FU on CHF and AFib.  Mainly need to know if he is stable from CHF standpoint and how well his HR is controlled in AFib.  He has chronic volume overload.  FYI - The [facility] SW is helping with providing food due to food insecurity (through [facility]).    Services Requested:  Vital Signs (BP, Pulse, O2, Weight)  Physical Exam  Medication Reconciliation  Rhythm Strip (AliveCor Device)  # of Visits Needed/Frequency per Week: 1 time every other week for 3 visits

## 2018-07-28 ENCOUNTER — Telehealth: Payer: Self-pay | Admitting: Internal Medicine

## 2018-07-28 DIAGNOSIS — R0981 Nasal congestion: Secondary | ICD-10-CM

## 2018-07-28 NOTE — Telephone Encounter (Signed)
Needs refill on fexofenadine [facility] ALLERGY) 60 MG tablet  [facility] [phone] - [place], [address] [place] AT [address] ;pt contact [phone]

## 2018-07-28 NOTE — Telephone Encounter (Signed)
Pt states he was told by pharmacy that medicaid would not pay for allegra, which from nurse understands is true. Will ask dr kim's input. Looked on goodrx and with coupon med is 11.00 per 30 days

## 2018-07-28 NOTE — Telephone Encounter (Signed)
Allegra was refilled 06/12/2018 #90 x 3 RF's. Called pt - no answer; left message to call the office.

## 2018-07-28 NOTE — Progress Notes (Signed)
Notes reviewed. HR still too high. PLAN:   Continue current medications at current dosages.  Start Metoprolol succinate 25 mg once daily in addition to current medications - please send Rx to his pharmacy Richardson Dopp, PA-C    07/28/2018 5:25 PM

## 2018-07-29 ENCOUNTER — Telehealth: Payer: Self-pay | Admitting: Licensed Clinical Social Worker

## 2018-07-29 MED ORDER — CETIRIZINE HCL 10 MG PO TABS: 10.0000 mg | ORAL_TABLET | Freq: Every day | ORAL | 3 refills | Status: DC

## 2018-07-29 NOTE — Telephone Encounter (Signed)
CSW contacted patient to follow up on weekly food delivery package. Patient informed of delivery time and no face to face contact during delivery. Patient verbalizes understanding and grateful for the assistance.  CSW continues to follow for supportive needs. Jackie Analeese Andreatta, LCSW, CCSW-MCS [phone]  

## 2018-07-29 NOTE — Telephone Encounter (Signed)
Hi Helen, you are correct that we can find an affordable antihistamine for patient. I found Zyrtec on the Medicaid formulary so sent in a prescription for that instead (patient tried/failed loratadine in the past). Thank you

## 2018-07-29 NOTE — Progress Notes (Signed)
Insurance formulary lists Zyrtec, switched patient from [employer] to Zyrtec

## 2018-08-05 ENCOUNTER — Telehealth: Payer: Self-pay | Admitting: Licensed Clinical Social Worker

## 2018-08-05 NOTE — Telephone Encounter (Signed)
CSW contacted patient to follow up on weekly food delivery package. Patient informed of delivery time and no face to face contact during delivery. Patient verbalizes understanding and grateful for the assistance.  CSW continues to follow for supportive needs. Jackie Kilee Hedding, LCSW, CCSW-MCS [phone]  

## 2018-08-06 ENCOUNTER — Encounter: Payer: Medicaid Other | Admitting: Internal Medicine

## 2018-08-07 ENCOUNTER — Telehealth: Payer: Self-pay | Admitting: *Deleted

## 2018-08-07 ENCOUNTER — Telehealth: Payer: Self-pay | Admitting: Physician Assistant

## 2018-08-07 DIAGNOSIS — I509 Heart failure, unspecified: Secondary | ICD-10-CM | POA: Diagnosis not present

## 2018-08-07 DIAGNOSIS — I5032 Chronic diastolic (congestive) heart failure: Secondary | ICD-10-CM | POA: Diagnosis not present

## 2018-08-07 NOTE — Telephone Encounter (Signed)
SPOKE WITH AND PT AWARE OF FEEDBACK OF [facility]  WITH BP READINGS AND IS NOT TAKING TOPROL AND WILL NOT START.

## 2018-08-07 NOTE — Telephone Encounter (Signed)
Reviewed notes from [facility] from visit done yesterday. His BP and HR are stable. I had previously requested that he start on Toprol XL 25 mg once daily.  In review of the chart, it does not look like that was sent in or that he has started it. As his BP and HR look better now, if he has not started the Toprol XL, he does not need to start it and the previous order for Toprol can be canceled. IF he did start on the Toprol and is currently taking it, he should continue taking it. Richardson Dopp, PA-C    08/07/2018 1:46 PM

## 2018-08-10 NOTE — Telephone Encounter (Signed)
See phone note from same date (08/07/2018) for reply. Richardson Dopp, PA-C    08/10/2018 12:10 PM

## 2018-08-12 ENCOUNTER — Telehealth: Payer: Self-pay | Admitting: Licensed Clinical Social Worker

## 2018-08-12 NOTE — Telephone Encounter (Signed)
CSW contacted patient to follow up on weekly food delivery package. Patient informed of no face to face contact during delivery. CSW discussed transition option for food delivery as the Covid 19 Food relief program will be ending on August 28, 2018. Patient verbalizes understanding and grateful for the assistance.  CSW continues to follow for supportive needs. Jackie [place] Luczak, LCSW, CCSW-MCS [phone] 

## 2018-08-17 DIAGNOSIS — I5032 Chronic diastolic (congestive) heart failure: Secondary | ICD-10-CM | POA: Diagnosis not present

## 2018-08-17 DIAGNOSIS — I509 Heart failure, unspecified: Secondary | ICD-10-CM | POA: Diagnosis not present

## 2018-08-19 ENCOUNTER — Telehealth: Payer: Self-pay | Admitting: Licensed Clinical Social Worker

## 2018-08-19 NOTE — Telephone Encounter (Signed)
CSW contacted patient to follow up on weekly food delivery package. Patient informed of no face to face contact during delivery. CSW discussed transition option for food delivery as the Covid 19 Food relief program will be ending on August 28, 2018. Patient verbalizes understanding and grateful for the assistance.  CSW continues to follow for supportive needs. Jackie Alexsander Cavins, LCSW, CCSW-MCS [phone] 

## 2018-08-24 ENCOUNTER — Telehealth: Payer: Self-pay | Admitting: Licensed Clinical Social Worker

## 2018-08-24 NOTE — Telephone Encounter (Signed)
CSW received referral from [facility] to assist patient with resources for housing. CSW contacted patient who states he has lived with his uncle on and off since 72. Unfortunately, he noted that his uncle has declined and suffers from dementia. The current home environment has become stressful for Mr. Rising and he reports is impacting his health. He reports he has not has a place of his own for some time and added limited income ([employer] disability income) which impacts his options for housing. CSW provided resources [employer], [place]. Patient will call to follow up with applications and CSW will continue to explore additional options for patient. Patient grateful for the resources and support. CSW encouraged patient o remain in contact with CSW as housing searches are not quickly resolved and CSW will continue to assist with housing search. Patient agreeable. CSW continues to be available as needed. Raquel Sarna, [place], [place]

## 2018-08-25 ENCOUNTER — Telehealth: Payer: Self-pay | Admitting: Licensed Clinical Social Worker

## 2018-08-25 NOTE — Telephone Encounter (Signed)
CSW contacted patient to follow up on weekly food delivery package. Patient informed of delivery time and no face to face contact during delivery. CSW shared transition option for food delivery as the Covid 19 Food relief program will be ending on August 28, 2018. Message left as no answer.  CSW continues to follow for supportive needs. Jackie Toya Palacios, LCSW, CCSW-MCS [phone] 

## 2018-08-26 ENCOUNTER — Telehealth: Payer: Self-pay | Admitting: Licensed Clinical Social Worker

## 2018-08-26 NOTE — Telephone Encounter (Signed)
Patient gave CSW verbal consent for referral through [employer] for housing assistance. CSW referred patient to [employer]. Patient aware of referral and will return call to CSW when he heard from the referral source. Raquel Sarna, [place], [place]

## 2018-09-07 DIAGNOSIS — I509 Heart failure, unspecified: Secondary | ICD-10-CM | POA: Diagnosis not present

## 2018-09-07 DIAGNOSIS — I5032 Chronic diastolic (congestive) heart failure: Secondary | ICD-10-CM | POA: Diagnosis not present

## 2018-09-14 ENCOUNTER — Ambulatory Visit (INDEPENDENT_AMBULATORY_CARE_PROVIDER_SITE_OTHER): Payer: Medicaid Other | Admitting: Internal Medicine

## 2018-09-14 ENCOUNTER — Encounter: Payer: Self-pay | Admitting: Internal Medicine

## 2018-09-14 ENCOUNTER — Other Ambulatory Visit: Payer: Self-pay

## 2018-09-14 VITALS — BP 111/67 | HR 94 | Temp 98.7°F | Ht 71.0 in | Wt 332.5 lb

## 2018-09-14 DIAGNOSIS — I4891 Unspecified atrial fibrillation: Secondary | ICD-10-CM

## 2018-09-14 DIAGNOSIS — R0981 Nasal congestion: Secondary | ICD-10-CM

## 2018-09-14 DIAGNOSIS — F17210 Nicotine dependence, cigarettes, uncomplicated: Secondary | ICD-10-CM

## 2018-09-14 DIAGNOSIS — I10 Essential (primary) hypertension: Secondary | ICD-10-CM

## 2018-09-14 DIAGNOSIS — Z23 Encounter for immunization: Secondary | ICD-10-CM

## 2018-09-14 DIAGNOSIS — I110 Hypertensive heart disease with heart failure: Secondary | ICD-10-CM | POA: Diagnosis not present

## 2018-09-14 DIAGNOSIS — Z79899 Other long term (current) drug therapy: Secondary | ICD-10-CM | POA: Diagnosis not present

## 2018-09-14 DIAGNOSIS — I5032 Chronic diastolic (congestive) heart failure: Secondary | ICD-10-CM

## 2018-09-14 NOTE — Patient Instructions (Signed)
[name],  It was a pleasure to see you today. Thank you for coming in.   Today we discussed your chronic medical conditions. It looks like you are doing well today! We will check some labs today. Please continue your medications as prescribed.   I will look into the allergy medication and let you know if we can change anything.   We have given you the tetanus shot.   Please return to clinic in 6 months or sooner if needed.   Thank you again for coming in.   [name]

## 2018-09-14 NOTE — Progress Notes (Signed)
CC: Heart failure, hypertension, sinus congestion, and atrial fibrillation  HPI:  Frank Morales is a 58 y.o.  with a PMH listed below presenting for heart failure, hypertension, sinus congestion, and atrial fibrillation   Please see A&P for status of the patient's chronic medical conditions  Past Medical History:  Diagnosis Date  . Atrial fibrillation and flutter ([place]) 08/17/2015   A. S/p failed DCCV // b. Severe BAE on echo >> rate control strategy (has seen AF clinic) // c. Xarelto for anticoag (CHADS2-VASc=2 / CHF, HTN)  . Chronic diastolic CHF (congestive heart failure) ([place]) 08/30/2015   A. Echo 6/17: Apical HK, moderate focal basal and mild concentric LVH, EF 50-55, diffuse HK, trivial MR, severe BAE, mild TR, PASP 37  . Hepatitis C, chronic ([place]) 08/19/2015  . History of cardiac catheterization    a. LHC 6/17: LAD irregs, o/w no CAD  . OSA (obstructive sleep apnea) 11/21/2015  . Sleep apnea   . Tobacco abuse    Review of Systems: Refer to history of present illness and assessment and plans for pertinent review of systems, all others reviewed and negative.  Physical Exam:  Vitals:   09/14/18 1358  BP: 111/67  Pulse: 94  Temp: 98.7 F (37.1 C)  TempSrc: Oral  SpO2: 99%  Weight: (!) 332 lb 8 oz (150.8 kg)  Height: 5\' 11"  (1.803 m)    Physical Exam  Constitutional: He is oriented to person, place, and time.  Obese male, NAD, sitting in chair  HENT:  Head: Normocephalic and atraumatic.  Eyes: Pupils are equal, round, and reactive to light. Conjunctivae and EOM are normal.  Neck: Normal range of motion. Neck supple.  Cardiovascular: Normal rate, regular rhythm and normal heart sounds.  Pulmonary/Chest: Effort normal and breath sounds normal. No respiratory distress. He has no wheezes. He has no rales.  Abdominal: Soft. Bowel sounds are normal. He exhibits no distension.  Musculoskeletal:        General: Edema (1+ BL LE edema, was wearing compression stockings)  present.  Neurological: He is alert and oriented to person, place, and time.  Skin: Skin is warm and dry.  Psychiatric: Mood and affect normal.    Social History   Socioeconomic History  . Marital status: Single    Spouse name: Not on file  . Number of children: Not on file  . Years of education: Not on file  . Highest education level: Not on file  Occupational History  . Occupation: unemployed    Comment: Previous Soil scientist  . Financial resource strain: Not on file  . Food insecurity    Worry: Not on file    Inability: Not on file  . Transportation needs    Medical: Not on file    Non-medical: Not on file  Tobacco Use  . Smoking status: Current Every Day Smoker    Packs/day: 0.20    Types: Cigarettes  . Smokeless tobacco: Never Used  . Tobacco comment: down to 1-2 cig/day with NRT  Substance and Sexual Activity  . Alcohol use: Yes    Alcohol/week: 5.0 standard drinks    Types: 5 Standard drinks or equivalent per week    Comment: 2 times a week beer  . Drug use: No  . Sexual activity: Not on file  Lifestyle  . Physical activity    Days per week: Not on file    Minutes per session: Not on file  . Stress: Not on file  Relationships  .  Social Herbalist on phone: Patient refused    Gets together: Patient refused    Attends religious service: Patient refused    Active member of club or organization: Patient refused    Attends meetings of clubs or organizations: Patient refused    Relationship status: Patient refused  . Intimate partner violence    Fear of current or ex partner: Patient refused    Emotionally abused: Patient refused    Physically abused: Patient refused    Forced sexual activity: Patient refused  Other Topics Concern  . Not on file  Social History Narrative  . Not on file    Family History  Problem Relation Age of Onset  . Hypertension Mother   . Diabetes Mother   . Pneumonia Father   . Hypertension Maternal  Grandfather   . Colon cancer Neg Hx     Assessment & Plan:   See Encounters Tab for problem based charting.  Patient discussed with Dr. Rebeca Alert

## 2018-09-15 LAB — BMP8+ANION GAP
Anion Gap: 19 mmol/L — ABNORMAL HIGH (ref 10.0–18.0)
BUN/Creatinine Ratio: 14 (ref 9–20)
BUN: 10 mg/dL (ref 6–24)
CO2: 29 mmol/L (ref 20–29)
Calcium: 8.1 mg/dL — ABNORMAL LOW (ref 8.7–10.2)
Chloride: 89 mmol/L — ABNORMAL LOW (ref 96–106)
Creatinine, Ser: 0.69 mg/dL — ABNORMAL LOW (ref 0.76–1.27)
GFR calc Af Amer: 123 mL/min/{1.73_m2} (ref >59)
GFR calc non Af Amer: 106 mL/min/{1.73_m2} (ref >59)
Glucose: 99 mg/dL (ref 65–99)
Potassium: 3.3 mmol/L — ABNORMAL LOW (ref 3.5–5.2)
Sodium: 137 mmol/L (ref 134–144)

## 2018-09-15 NOTE — Assessment & Plan Note (Signed)
He reports that he is taking lasix 80 mg BID, he also wears compression stockings.  He states that his breathing is doing so-so, currently on 3 L oxygen, he states that he is on this at all the time, during the day and at night.  He weighs himself daily and his weight ranges from 3 32-3 28.  He is trying to eat healthy, with lots of solids and only eating about twice a day.  He denies any chest pain, nausea, vomiting, fevers, chills, or other symptoms at this time.  On exam he appears comfortable, is on 3 L nasal cannula, no crackles, 1+ bilateral lower extremity edema patient does not appear significantly volume overloaded at this time.  We will continue current regiment and continue to monitor.  -Continue Lasix 80 mg twice daily -Continue compression stocking -Check BMP -Continue to follow-up with cardiology

## 2018-09-15 NOTE — Assessment & Plan Note (Signed)
Patient has sinus issues, he is currently taking Zyrtec which he states helps with his sinus, however he still feeling stuffed up.  He is especially concerned symptoms over the morning and late at night, he has been using Flonase and Azo lasting.  Patient feels like the Allegra was more helpful.  He follows up with ENT and has surgery scheduled on the 17th.  -Will contact Dr. Maudie Mercury to see if Allegra or similar allergy medication can be trialed -Continue Zyrtec for now -Continue Flonase -Continue azelastine -Follow-up with ENT

## 2018-09-15 NOTE — Assessment & Plan Note (Signed)
Patient is on Lasix 80 mg twice daily for his heart failure and diltiazem to 40 mg daily.  He denies any issues taking his medications.  Denies any chest pain, lightheadedness, vision changes, dizziness, or fatigue.  Blood pressure today is well controlled at 111/67.  -Continue Lasix 80 mg twice daily -Continue diltiazem 240 mg daily

## 2018-09-16 DIAGNOSIS — I509 Heart failure, unspecified: Secondary | ICD-10-CM | POA: Diagnosis not present

## 2018-09-16 DIAGNOSIS — I5032 Chronic diastolic (congestive) heart failure: Secondary | ICD-10-CM | POA: Diagnosis not present

## 2018-09-25 ENCOUNTER — Other Ambulatory Visit: Payer: Self-pay | Admitting: Otolaryngology

## 2018-09-28 ENCOUNTER — Other Ambulatory Visit: Payer: Self-pay | Admitting: Internal Medicine

## 2018-09-28 NOTE — Pre-Procedure Instructions (Addendum)
Frank Morales  09/28/2018      [id number] - [address], [place] AT [address] [place] [address] Phone: [phone] Fax: [phone]    Your procedure is scheduled on Fri., October 02, 2018 from 7:30AM-9:00AM  Report to [facility] Entrance "A" at 5:30AM  Call this number if you have problems the morning of surgery:  [phone]   Remember:  Do not eat or drink after midnight on July 16th    Take these medicines the morning of surgery with A SIP OF WATER: Azelastine (ASTELIN), Cetirizine (ZYRTEC), Diltiazem (CARDIZEM CD), and Loratadine (CLARITIN)    If needed: Fluticasone (FLONASE)  Follow your surgeon's instructions on when to stop Xarelto.  If no instructions were given by your surgeon then you will need to call the office to get those instructions.    As of today, stop taking all Aspirin (unless instructed by your doctor) and Other Aspirin containing products, Vitamins, Fish oils, and Herbal medications. Also stop all NSAIDS i.e. Advil, Ibuprofen, Motrin, Aleve, Anaprox, Naproxen, BC, Goody Powders, and all Supplements.   Special instructions:  [place]- Preparing For Surgery  Before surgery, you can play an important role. Because skin is not sterile, your skin needs to be as free of germs as possible. You can reduce the number of germs on your skin by washing with CHG (chlorahexidine gluconate) Soap before surgery.  CHG is an antiseptic cleaner which kills germs and bonds with the skin to continue killing germs even after washing.    Please do not use if you have an allergy to CHG or antibacterial soaps. If your skin becomes reddened/irritated stop using the CHG.  Do not shave (including legs and underarms) for at least 48 hours prior to first CHG shower. It is OK to shave your face.  Please follow these instructions carefully.   1. Shower the NIGHT BEFORE SURGERY and the  MORNING OF SURGERY with CHG.   2. If you chose to wash your hair, wash your hair first as usual with your normal shampoo.  3. After you shampoo, rinse your hair and body thoroughly to remove the shampoo.  4. Use CHG as you would any other liquid soap. You can apply CHG directly to the skin and wash gently with a scrungie or a clean washcloth.   5. Apply the CHG Soap to your body ONLY FROM THE NECK DOWN.  Do not use on open wounds or open sores. Avoid contact with your eyes, ears, mouth and genitals (private parts). Wash Face and genitals (private parts)  with your normal soap.  6. Wash thoroughly, paying special attention to the area where your surgery will be performed.  7. Thoroughly rinse your body with warm water from the neck down.  8. DO NOT shower/wash with your normal soap after using and rinsing off the CHG Soap.  9. Pat yourself dry with a CLEAN TOWEL.  10. Wear CLEAN PAJAMAS to bed the night before surgery, wear comfortable clothes the morning of surgery  11. Place CLEAN SHEETS on your bed the night of your first shower and DO NOT SLEEP WITH PETS.   Day of Surgery:  Oral Hygiene is also important to reduce your risk of infection.  Remember - BRUSH YOUR TEETH THE MORNING OF SURGERY WITH YOUR REGULAR TOOTHPASTE   Do not wear jewelry.  Do not wear lotions, powders, colognes, or deodorant.  Do not shave 48  hours prior to surgery.  Men may shave face.  Do not bring valuables to the hospital.  [facility] is not responsible for any belongings or valuables.  You may bring your Cpap machine if you planned to stay overnight.  Contacts, dentures or bridgework may not be worn into surgery.    For patients admitted to the hospital, discharge time will be determined by your treatment team.  Patients discharged the day of surgery will not be allowed to drive home.   Please wear clean clothes to the hospital/surgery center.    Please read over the following fact sheets that you  were given. Pain Booklet, Coughing and Deep Breathing, MRSA Information and Surgical Site Infection Prevention

## 2018-09-28 NOTE — Progress Notes (Signed)
Internal Medicine Clinic Attending  Case discussed with Dr. Krienke at the time of the visit.  We reviewed the resident's history and exam and pertinent patient test results.  I agree with the assessment, diagnosis, and plan of care documented in the resident's note.  Alexander Raines, M.D., Ph.D.  

## 2018-09-28 NOTE — Telephone Encounter (Signed)
diltiazem (CARDIZEM CD) 240 MG 24 hr capsule, refill request @  [facility] [phone] - [place], [address] AT [place] [phone] (Phone) [phone] (Fax)

## 2018-09-29 ENCOUNTER — Other Ambulatory Visit: Payer: Self-pay

## 2018-09-29 ENCOUNTER — Encounter (HOSPITAL_COMMUNITY): Payer: Self-pay

## 2018-09-29 ENCOUNTER — Other Ambulatory Visit (HOSPITAL_COMMUNITY)
Admission: RE | Admit: 2018-09-29 | Discharge: 2018-09-29 | Disposition: A | Discharge status: Home / Self Care | Payer: Medicaid Other | Source: Ambulatory Visit | Attending: Otolaryngology | Admitting: Otolaryngology

## 2018-09-29 ENCOUNTER — Encounter (HOSPITAL_COMMUNITY)
Admission: RE | Admit: 2018-09-29 | Discharge: 2018-09-29 | Disposition: A | Discharge status: Home / Self Care | Payer: Medicaid Other | Source: Ambulatory Visit | Attending: Otolaryngology | Admitting: Otolaryngology

## 2018-09-29 DIAGNOSIS — Z1159 Encounter for screening for other viral diseases: Secondary | ICD-10-CM | POA: Insufficient documentation

## 2018-09-29 DIAGNOSIS — Z01812 Encounter for preprocedural laboratory examination: Secondary | ICD-10-CM | POA: Insufficient documentation

## 2018-09-29 HISTORY — DX: Essential (primary) hypertension: I10

## 2018-09-29 HISTORY — DX: Dependence on supplemental oxygen: Z99.81

## 2018-09-29 LAB — SARS CORONAVIRUS 2 (TAT 6-24 HRS): SARS Coronavirus 2: NEGATIVE

## 2018-09-29 LAB — CBC
HCT: 44.3 % (ref 39.0–52.0)
Hemoglobin: 14.7 g/dL (ref 13.0–17.0)
MCH: 32.2 pg (ref 26.0–34.0)
MCHC: 33.2 g/dL (ref 30.0–36.0)
MCV: 96.9 fL (ref 80.0–100.0)
Platelets: 163 10*3/uL (ref 150–400)
RBC: 4.57 MIL/uL (ref 4.22–5.81)
RDW: 15 % (ref 11.5–15.5)
WBC: 4.7 10*3/uL (ref 4.0–10.5)
nRBC: 0 % (ref 0.0–0.2)

## 2018-09-29 LAB — BASIC METABOLIC PANEL
Anion gap: 12 (ref 5–15)
BUN: 8 mg/dL (ref 6–20)
CO2: 28 mmol/L (ref 22–32)
Calcium: 8.8 mg/dL — ABNORMAL LOW (ref 8.9–10.3)
Chloride: 95 mmol/L — ABNORMAL LOW (ref 98–111)
Creatinine, Ser: 0.9 mg/dL (ref 0.61–1.24)
GFR calc Af Amer: >60 mL/min (ref >60)
GFR calc non Af Amer: >60 mL/min (ref >60)
Glucose, Bld: 114 mg/dL — ABNORMAL HIGH (ref 70–99)
Potassium: 3.8 mmol/L (ref 3.5–5.1)
Sodium: 135 mmol/L (ref 135–145)

## 2018-09-29 NOTE — Progress Notes (Signed)
PCP - Dr. Sherry Ruffing  Cardiologist - Dr. Harrington Challenger, P.  Chest x-ray - 05/05/2018 (E)  EKG - 05/05/2018 (E)  Stress Test - Denies  ECHO - 01/23/18 (E)  Cardiac Cath - 08/21/15 (E)  AICD-na PM-na LOOP-na  Sleep Study - Yes-Positive  CPAP - None- Pt uses continuous O2 at 3L  LABS- 09/29/2018: CBC, BMP, COVID 10/02/2018: PT  ASA-Denies Xarelto- LD- 7/14  ERAS- No   Anesthesia- No  Pt denies having chest pain, sob, or fever at this time. All instructions explained to the pt, with a verbal understanding of the material. Pt agrees to go over the instructions while at home for a better understanding. Pt also instructed to self quarantine after being tested for COVID-19. The opportunity to ask questions was provided.   Coronavirus Screening  Have you experienced the following symptoms:  Cough yes/no: No Fever (>100.72F)  yes/no: No Runny nose yes/no: No Sore throat yes/no: No Difficulty breathing/shortness of breath  yes/no: No  Have you or a family member traveled in the last 14 days and where? yes/no: No   If the patient indicates "YES" to the above questions, their PAT will be rescheduled to limit the exposure to others and, the surgeon will be notified. THE PATIENT WILL NEED TO BE ASYMPTOMATIC FOR 14 DAYS.   If the patient is not experiencing any of these symptoms, the PAT nurse will instruct them to NOT bring anyone with them to their appointment since they may have these symptoms or traveled as well.   Please remind your patients and families that hospital visitation restrictions are in effect and the importance of the restrictions.

## 2018-10-01 NOTE — Anesthesia Preprocedure Evaluation (Addendum)
Anesthesia Evaluation  Patient identified by MRN, date of birth, ID band Patient awake    Reviewed: Allergy & Precautions, NPO status , Patient's Chart, lab work & pertinent test results  History of Anesthesia Complications Negative for: history of anesthetic complications  Airway Mallampati: III  TM Distance: >3 FB Neck ROM: Full    Dental  (+) Dental Advisory Given, Poor Dentition   Pulmonary sleep apnea , Current Smoker,    Pulmonary exam normal        Cardiovascular hypertension, +CHF  Normal cardiovascular exam+ dysrhythmias Atrial Fibrillation  Rhythm:Irregular  Study Conclusions  - Left ventricle: The cavity size was normal. Wall thickness was   increased in a pattern of mild LVH. Systolic function was normal.   The estimated ejection fraction was in the range of 55% to 60%.   Wall motion was normal; there were no regional wall motion   abnormalities. - Ventricular septum: Septal motion showed abnormal function and   dyssynergy. The contour showed diastolic flattening. - Mitral valve: Calcified annulus. - Left atrium: The atrium was mildly dilated. - Right ventricle: The cavity size was mildly dilated. Wall   thickness was normal. Systolic function was moderately reduced. - Right atrium: The atrium was severely dilated. - Tricuspid valve: There was mild regurgitation.    Neuro/Psych negative neurological ROS  negative psych ROS   GI/Hepatic negative GI ROS, (+) Hepatitis -, C  Endo/Other  Morbid obesity  Renal/GU negative Renal ROS     Musculoskeletal negative musculoskeletal ROS (+)   Abdominal   Peds  Hematology negative hematology ROS (+)   Anesthesia Other Findings Day of surgery medications reviewed with the patient.  Reproductive/Obstetrics                           Anesthesia Physical Anesthesia Plan  ASA: III  Anesthesia Plan: General   Post-op Pain Management:     Induction: Intravenous  PONV Risk Score and Plan: 3 and Ondansetron, Dexamethasone and Scopolamine patch - Pre-op  Airway Management Planned: Oral ETT  Additional Equipment:   Intra-op Plan:   Post-operative Plan: Extubation in OR  Informed Consent: I have reviewed the patients History and Physical, chart, labs and discussed the procedure including the risks, benefits and alternatives for the proposed anesthesia with the patient or authorized representative who has indicated his/her understanding and acceptance.     Dental advisory given  Plan Discussed with: CRNA and Anesthesiologist  Anesthesia Plan Comments:         Anesthesia Quick Evaluation

## 2018-10-02 ENCOUNTER — Encounter (HOSPITAL_COMMUNITY): Payer: Self-pay

## 2018-10-02 ENCOUNTER — Ambulatory Visit (HOSPITAL_COMMUNITY): Payer: Medicaid Other | Admitting: Anesthesiology

## 2018-10-02 ENCOUNTER — Ambulatory Visit (HOSPITAL_COMMUNITY)
Admission: RE | Admit: 2018-10-02 | Discharge: 2018-10-02 | Disposition: A | Discharge status: Home / Self Care | Payer: Medicaid Other | Attending: Otolaryngology | Admitting: Otolaryngology

## 2018-10-02 ENCOUNTER — Other Ambulatory Visit: Payer: Self-pay

## 2018-10-02 ENCOUNTER — Encounter (HOSPITAL_COMMUNITY)
Admission: RE | Disposition: A | Discharge status: Home / Self Care | Payer: Self-pay | Source: Home / Self Care | Attending: Otolaryngology

## 2018-10-02 DIAGNOSIS — Z7901 Long term (current) use of anticoagulants: Secondary | ICD-10-CM | POA: Insufficient documentation

## 2018-10-02 DIAGNOSIS — F17210 Nicotine dependence, cigarettes, uncomplicated: Secondary | ICD-10-CM | POA: Diagnosis not present

## 2018-10-02 DIAGNOSIS — Z9981 Dependence on supplemental oxygen: Secondary | ICD-10-CM | POA: Insufficient documentation

## 2018-10-02 DIAGNOSIS — J329 Chronic sinusitis, unspecified: Secondary | ICD-10-CM | POA: Insufficient documentation

## 2018-10-02 DIAGNOSIS — G4733 Obstructive sleep apnea (adult) (pediatric): Secondary | ICD-10-CM | POA: Insufficient documentation

## 2018-10-02 DIAGNOSIS — J341 Cyst and mucocele of nose and nasal sinus: Secondary | ICD-10-CM | POA: Insufficient documentation

## 2018-10-02 DIAGNOSIS — I110 Hypertensive heart disease with heart failure: Secondary | ICD-10-CM | POA: Insufficient documentation

## 2018-10-02 DIAGNOSIS — I5032 Chronic diastolic (congestive) heart failure: Secondary | ICD-10-CM | POA: Insufficient documentation

## 2018-10-02 DIAGNOSIS — B182 Chronic viral hepatitis C: Secondary | ICD-10-CM | POA: Insufficient documentation

## 2018-10-02 DIAGNOSIS — Z79899 Other long term (current) drug therapy: Secondary | ICD-10-CM | POA: Diagnosis not present

## 2018-10-02 DIAGNOSIS — I4891 Unspecified atrial fibrillation: Secondary | ICD-10-CM | POA: Insufficient documentation

## 2018-10-02 HISTORY — PX: SINUS ENDO WITH FUSION: SHX5329

## 2018-10-02 LAB — COMPREHENSIVE METABOLIC PANEL
ALT: 31 U/L (ref 0–44)
AST: 33 U/L (ref 15–41)
Albumin: 3.3 g/dL — ABNORMAL LOW (ref 3.5–5.0)
Alkaline Phosphatase: 55 U/L (ref 38–126)
Anion gap: 9 (ref 5–15)
BUN: 14 mg/dL (ref 6–20)
CO2: 29 mmol/L (ref 22–32)
Calcium: 9.1 mg/dL (ref 8.9–10.3)
Chloride: 98 mmol/L (ref 98–111)
Creatinine, Ser: 0.9 mg/dL (ref 0.61–1.24)
GFR calc Af Amer: >60 mL/min (ref >60)
GFR calc non Af Amer: >60 mL/min (ref >60)
Glucose, Bld: 114 mg/dL — ABNORMAL HIGH (ref 70–99)
Potassium: 3.5 mmol/L (ref 3.5–5.1)
Sodium: 136 mmol/L (ref 135–145)
Total Bilirubin: 1.1 mg/dL (ref 0.3–1.2)
Total Protein: 6.8 g/dL (ref 6.5–8.1)

## 2018-10-02 LAB — PROTIME-INR
INR: 1 (ref 0.8–1.2)
Prothrombin Time: 13.3 seconds (ref 11.4–15.2)

## 2018-10-02 SURGERY — SURGERY, PARANASAL SINUS, ENDOSCOPIC, WITH NASAL SEPTOPLASTY, TURBINOPLASTY, AND MAXILLARY SINUSOTOMY
Anesthesia: General | Site: Nose

## 2018-10-02 MED ORDER — PROMETHAZINE HCL 25 MG/ML IJ SOLN: 6.2500 mg | INTRAMUSCULAR | Status: DC | PRN

## 2018-10-02 MED ORDER — LIDOCAINE-EPINEPHRINE 1 %-1:100000 IJ SOLN
INTRAMUSCULAR | Status: AC
  Filled 2018-10-02: qty 1

## 2018-10-02 MED ORDER — BACITRACIN ZINC 500 UNIT/GM EX OINT
TOPICAL_OINTMENT | CUTANEOUS | Status: DC | PRN
  Administered 2018-10-02: 1 via TOPICAL

## 2018-10-02 MED ORDER — MIDAZOLAM HCL 2 MG/2ML IJ SOLN
INTRAMUSCULAR | Status: DC | PRN
  Administered 2018-10-02: 2 mg via INTRAVENOUS

## 2018-10-02 MED ORDER — SUGAMMADEX SODIUM 200 MG/2ML IV SOLN
INTRAVENOUS | Status: DC | PRN
  Administered 2018-10-02: 300 mg via INTRAVENOUS

## 2018-10-02 MED ORDER — ARTIFICIAL TEARS OPHTHALMIC OINT
TOPICAL_OINTMENT | OPHTHALMIC | Status: DC | PRN
  Administered 2018-10-02: 1 via OPHTHALMIC

## 2018-10-02 MED ORDER — LACTATED RINGERS IV SOLN
INTRAVENOUS | Status: DC | PRN
  Administered 2018-10-02: 07:00:00 via INTRAVENOUS

## 2018-10-02 MED ORDER — BACITRACIN ZINC 500 UNIT/GM EX OINT
TOPICAL_OINTMENT | CUTANEOUS | Status: AC
  Filled 2018-10-02: qty 28.35

## 2018-10-02 MED ORDER — 0.9 % SODIUM CHLORIDE (POUR BTL) OPTIME
TOPICAL | Status: DC | PRN
  Administered 2018-10-02: 08:00:00 1000 mL

## 2018-10-02 MED ORDER — OXYMETAZOLINE HCL 0.05 % NA SOLN
NASAL | Status: DC | PRN
  Administered 2018-10-02: 1 via TOPICAL

## 2018-10-02 MED ORDER — ONDANSETRON HCL 4 MG/2ML IJ SOLN
INTRAMUSCULAR | Status: DC | PRN
  Administered 2018-10-02: 4 mg via INTRAVENOUS

## 2018-10-02 MED ORDER — LIDOCAINE-EPINEPHRINE 2 %-1:100000 IJ SOLN
INTRAMUSCULAR | Status: AC
  Filled 2018-10-02: qty 1

## 2018-10-02 MED ORDER — FENTANYL CITRATE (PF) 250 MCG/5ML IJ SOLN
INTRAMUSCULAR | Status: DC | PRN
  Administered 2018-10-02: 150 ug via INTRAVENOUS

## 2018-10-02 MED ORDER — CHLORHEXIDINE GLUCONATE CLOTH 2 % EX PADS: 6.0000 | MEDICATED_PAD | Freq: Once | CUTANEOUS | Status: DC

## 2018-10-02 MED ORDER — SUCCINYLCHOLINE CHLORIDE 200 MG/10ML IV SOSY
PREFILLED_SYRINGE | INTRAVENOUS | Status: DC | PRN
  Administered 2018-10-02: 140 mg via INTRAVENOUS

## 2018-10-02 MED ORDER — ROCURONIUM BROMIDE 10 MG/ML (PF) SYRINGE
PREFILLED_SYRINGE | INTRAVENOUS | Status: DC | PRN
  Administered 2018-10-02: 50 mg via INTRAVENOUS
  Administered 2018-10-02: 20 mg via INTRAVENOUS

## 2018-10-02 MED ORDER — ACETAMINOPHEN 500 MG PO TABS
1000.0000 mg | ORAL_TABLET | Freq: Once | ORAL | Status: AC
  Administered 2018-10-02: 07:00:00 1000 mg via ORAL
  Filled 2018-10-02: qty 2

## 2018-10-02 MED ORDER — PROPOFOL 10 MG/ML IV BOLUS
INTRAVENOUS | Status: AC
  Filled 2018-10-02: qty 20

## 2018-10-02 MED ORDER — SODIUM CHLORIDE 0.9 % IR SOLN
Status: DC | PRN
  Administered 2018-10-02: 1000 mL

## 2018-10-02 MED ORDER — SCOPOLAMINE 1 MG/3DAYS TD PT72
1.0000 | MEDICATED_PATCH | TRANSDERMAL | Status: DC
  Administered 2018-10-02: 1.5 mg via TRANSDERMAL
  Filled 2018-10-02: qty 1

## 2018-10-02 MED ORDER — OXYMETAZOLINE HCL 0.05 % NA SOLN
NASAL | Status: AC
  Filled 2018-10-02: qty 30

## 2018-10-02 MED ORDER — PROPOFOL 10 MG/ML IV BOLUS
INTRAVENOUS | Status: DC | PRN
  Administered 2018-10-02: 200 mg via INTRAVENOUS

## 2018-10-02 MED ORDER — MIDAZOLAM HCL 2 MG/2ML IJ SOLN
INTRAMUSCULAR | Status: AC
  Filled 2018-10-02: qty 2

## 2018-10-02 MED ORDER — FENTANYL CITRATE (PF) 250 MCG/5ML IJ SOLN
INTRAMUSCULAR | Status: AC
  Filled 2018-10-02: qty 5

## 2018-10-02 MED ORDER — LIDOCAINE-EPINEPHRINE 1 %-1:100000 IJ SOLN
INTRAMUSCULAR | Status: DC | PRN
  Administered 2018-10-02: 2 mL

## 2018-10-02 MED ORDER — LIDOCAINE 2% (20 MG/ML) 5 ML SYRINGE
INTRAMUSCULAR | Status: DC | PRN
  Administered 2018-10-02: 100 mg via INTRAVENOUS

## 2018-10-02 MED ORDER — CEPHALEXIN 500 MG PO CAPS: 500.0000 mg | ORAL_CAPSULE | Freq: Three times a day (TID) | ORAL | 0 refills | Status: DC

## 2018-10-02 MED ORDER — DEXAMETHASONE SODIUM PHOSPHATE 10 MG/ML IJ SOLN
INTRAMUSCULAR | Status: AC
  Filled 2018-10-02: qty 1

## 2018-10-02 MED ORDER — FENTANYL CITRATE (PF) 100 MCG/2ML IJ SOLN: 25.0000 ug | INTRAMUSCULAR | Status: DC | PRN

## 2018-10-02 MED ORDER — ONDANSETRON HCL 4 MG/2ML IJ SOLN
INTRAMUSCULAR | Status: AC
  Filled 2018-10-02: qty 2

## 2018-10-02 MED ORDER — DEXAMETHASONE SODIUM PHOSPHATE 10 MG/ML IJ SOLN
INTRAMUSCULAR | Status: DC | PRN
  Administered 2018-10-02: 10 mg via INTRAVENOUS

## 2018-10-02 MED ORDER — LIDOCAINE 2% (20 MG/ML) 5 ML SYRINGE
INTRAMUSCULAR | Status: AC
  Filled 2018-10-02: qty 5

## 2018-10-02 MED ORDER — CEFAZOLIN SODIUM-DEXTROSE 2-4 GM/100ML-% IV SOLN
INTRAVENOUS | Status: AC
  Filled 2018-10-02: qty 100

## 2018-10-02 SURGICAL SUPPLY — 48 items
BLADE RAD40 ROTATE 4M 4 5PK (BLADE) IMPLANT
BLADE RAD40 ROTATE 4M 4MM 5PK (BLADE)
BLADE RAD60 ROTATE M4 4 5PK (BLADE) IMPLANT
BLADE RAD60 ROTATE M4 4MM 5PK (BLADE)
BLADE ROTATE RAD 40 4 M4 (BLADE) ×2 IMPLANT
BLADE ROTATE RAD 40 4MM M4 (BLADE) ×1
BLADE ROTATE TRICUT 4MX13CM M4 (BLADE) ×1
BLADE ROTATE TRICUT 4X13 M4 (BLADE) ×2 IMPLANT
BLADE TRICUT ROTATE M4 4 5PK (BLADE) ×2 IMPLANT
BLADE TRICUT ROTATE M4 4MM 5PK (BLADE) ×1
CANISTER SUCT 3000ML PPV (MISCELLANEOUS) ×6 IMPLANT
COAGULATOR SUCT 6 FR SWTCH (ELECTROSURGICAL)
COAGULATOR SUCT SWTCH 10FR 6 (ELECTROSURGICAL) IMPLANT
COVER WAND RF STERILE (DRAPES) ×3 IMPLANT
DRAPE HALF SHEET 40X57 (DRAPES) IMPLANT
DRESSING NASAL KENNEDY 3.5X.9 (MISCELLANEOUS) IMPLANT
DRESSING TELFA 8X10 (GAUZE/BANDAGES/DRESSINGS) IMPLANT
DRSG NASAL KENNEDY 3.5X.9 (MISCELLANEOUS)
DRSG NASOPORE 8CM (GAUZE/BANDAGES/DRESSINGS) ×3 IMPLANT
ELECT REM PT RETURN 9FT ADLT (ELECTROSURGICAL)
ELECTRODE REM PT RTRN 9FT ADLT (ELECTROSURGICAL) IMPLANT
FILTER ARTHROSCOPY CONVERTOR (FILTER) IMPLANT
GLOVE ECLIPSE 7.5 STRL STRAW (GLOVE) ×3 IMPLANT
GOWN STRL REUS W/ TWL LRG LVL3 (GOWN DISPOSABLE) ×2 IMPLANT
GOWN STRL REUS W/TWL LRG LVL3 (GOWN DISPOSABLE) ×6
KIT BASIN OR (CUSTOM PROCEDURE TRAY) ×3 IMPLANT
KIT TURNOVER KIT B (KITS) ×3 IMPLANT
NDL HYPO 25GX1X1/2 BEV (NEEDLE) IMPLANT
NEEDLE HYPO 25GX1X1/2 BEV (NEEDLE) IMPLANT
NS IRRIG 1000ML POUR BTL (IV SOLUTION) ×3 IMPLANT
PAD ARMBOARD 7.5X6 YLW CONV (MISCELLANEOUS) ×6 IMPLANT
PATTIES SURGICAL .5 X3 (DISPOSABLE) ×3 IMPLANT
POSITIONER HEAD DONUT 9IN (MISCELLANEOUS) ×3 IMPLANT
SOLUTION ANTI FOG 6CC (MISCELLANEOUS) ×3 IMPLANT
SPECIMEN JAR SMALL (MISCELLANEOUS) ×6 IMPLANT
SUT CHROMIC 4 0 P 3 18 (SUTURE) IMPLANT
SUT ETHILON 3 0 PS 1 (SUTURE) IMPLANT
SUT PLAIN 4 0 ~~LOC~~ 1 (SUTURE) ×3 IMPLANT
SWAB COLLECTION DEVICE MRSA (MISCELLANEOUS) IMPLANT
SWAB CULTURE ESWAB REG 1ML (MISCELLANEOUS) IMPLANT
TOWEL GREEN STERILE FF (TOWEL DISPOSABLE) ×3 IMPLANT
TRACKER ENT INSTRUMENT (MISCELLANEOUS) ×3 IMPLANT
TRACKER ENT PATIENT (MISCELLANEOUS) ×3 IMPLANT
TRAY ENT MC OR (CUSTOM PROCEDURE TRAY) ×3 IMPLANT
TUBE CONNECTING 12'X1/4 (SUCTIONS) ×1
TUBE CONNECTING 12X1/4 (SUCTIONS) ×2 IMPLANT
TUBING STRAIGHTSHOT EPS 5PK (TUBING) ×3 IMPLANT
WATER STERILE IRR 1000ML POUR (IV SOLUTION) ×3 IMPLANT

## 2018-10-02 NOTE — Op Note (Signed)
Preop/postop diagnosis: Left ethmoid mucocele Procedure: Endoscopic total ethmoidectomy and maxillary antrostomy Anesthesia: General Estimated blood loss: Approximately 25 cc Indications: 58 year old with let up medical issues who has taken time to get approval for surgery and now has been certified.  He has a mucocele in the left ethmoid that is expanding but does not have any evidence of erosion into the brain or orbit.  He wants to have this removed.  He was informed the risk and benefits of the procedure and options were discussed all questions were answered and consent was obtained. Procedure: Patient was taken the operating placed supine position after general endotracheal tube anesthesia was placed in the supine position prepped and draped in the usual sterile manner.  The [employer] fusion guidance system was positioned calibrated with excellent accuracy.  Oxymetazoline pledgets were placed in the left side and then the middle turbinate inferior turbinate were injected with 1% lidocaine with 1 100,000 epinephrine.  The mucocele was aspirated and immediately once the needle was stuck into the anterior aspect a yellowish-brown fluid immediately was expressed.  Using the microdebrider the face of the mucocele was opened and marsupialized.  The inferior aspect of the mass was removed and then into the posterior ethmoid.  It did not have any significant thickening.  There was a significant amount of fluid filling the cavity but there was no solid tissue.  The nasofrontal duct was dissected opening it up nicely.  The max antrostomy was open and there was no significant polyp or thickening in the maxillary sinus.  Most all of the anterior face of the mucocele was removed.  The uncinated area superiorly and along the middle turbinate had some bleeding that was controlled with oxymetazoline pledgets.  The sphenoid was not entered.  Nasal pore was then placed soaked in Bactroban into the ethmoid cavity with good  hemostasis.  The nasopharynx was suctioned out of all blood and debris.  The patient was awakened brought to recovery in stable condition counts correct

## 2018-10-02 NOTE — H&P (Signed)
Frank Morales is an 58 y.o. male.   Chief Complaint: sinusitis HPI: hx of mass left sinus  Past Medical History:  Diagnosis Date  . Atrial fibrillation and flutter ([place]) 08/17/2015   A. S/p failed DCCV // b. Severe BAE on echo >> rate control strategy (has seen AF clinic) // c. Xarelto for anticoag (CHADS2-VASc=2 / CHF, HTN)  . Chronic diastolic CHF (congestive heart failure) ([place]) 08/30/2015   A. Echo 6/17: Apical HK, moderate focal basal and mild concentric LVH, EF 50-55, diffuse HK, trivial MR, severe BAE, mild TR, PASP 37  . Dependence on continuous supplemental oxygen    3L  . Hepatitis C, chronic ([place]) 08/19/2015  . History of cardiac catheterization    a. LHC 6/17: LAD irregs, o/w no CAD  . Hypertension   . OSA (obstructive sleep apnea) 11/21/2015   No Cpap  . Sleep apnea   . Tobacco abuse     Past Surgical History:  Procedure Laterality Date  . CARDIAC CATHETERIZATION N/A 08/21/2015   Procedure: Right/Left Heart Cath and Coronary Angiography;  Surgeon: Leonie Man, MD;  Location: [facility];  Service: Cardiovascular;  Laterality: N/A;  . CARDIOVERSION N/A 09/22/2015   Procedure: CARDIOVERSION;  Surgeon: Josue Hector, MD;  Location: [facility] ENDOSCOPY;  Service: Cardiovascular;  Laterality: N/A;  . COLONOSCOPY N/A 07/19/2016   Procedure: COLONOSCOPY;  Surgeon: Doran Stabler, MD;  Location: [facility] ENDOSCOPY;  Service: Gastroenterology;  Laterality: N/A;  . ESOPHAGOGASTRODUODENOSCOPY N/A 07/19/2016   Procedure: ESOPHAGOGASTRODUODENOSCOPY (EGD);  Surgeon: Doran Stabler, MD;  Location: [facility] END[facility]COPY;  Service: Gastroenterology;  Laterality: N/A;  . NO PAST SURGERIES      Family History  Problem Relation Age of Onset  . Hypertension Mother   . Diabetes Mother   . Pneumonia Father   . Hypertension Maternal Grandfather   . Colon cancer Neg Hx    Social History:  reports that he has been smoking cigarettes. He has been smoking about 0.20 packs per day. He has never used  smokeless tobacco. He reports current alcohol use of about 5.0 standard drinks of alcohol per week. He reports that he does not use drugs.  Allergies:  Allergies  Allergen Reactions  . Lactose Intolerance (Gi) Nausea And Vomiting    Medications Prior to Admission  Medication Sig Dispense Refill  . azelastine (ASTELIN) 0.1 % nasal spray Place 2 sprays into both nostrils 2 (two) times daily. Use in each nostril as directed up to twice a day. 30 mL 12  . cetirizine (ZYRTEC) 10 MG tablet Take 1 tablet (10 mg total) by mouth daily. 90 tablet 3  . diltiazem (CARDIZEM CD) 240 MG 24 hr capsule TAKE 2 CAPSULES(480 MG) BY MOUTH DAILY 180 capsule 0  . fluticasone (FLONASE) 50 MCG/ACT nasal spray Place 1 spray into both nostrils daily. (Patient taking differently: Place 1 spray into both nostrils daily as needed for allergies. ) 16 g 2  . potassium chloride (K-DUR) 10 MEQ tablet Take 6 tablets (60 mEq total) by mouth 2 (two) times daily. 360 tablet 11  . rivaroxaban (XARELTO) 20 MG TABS tablet Take 1 tablet (20 mg total) by mouth daily with supper. 90 tablet 3  . torsemide (DEMADEX) 20 MG tablet Take 4 tablets (80 mg total) by mouth 2 (two) times daily. 720 tablet 3  . loratadine (CLARITIN) 10 MG tablet Take 1 tablet (10 mg total) by mouth daily. 90 tablet 3    Results for orders placed  or performed during the hospital encounter of 10/02/18 (from the past 48 hour(s))  PT- INR Day of Surgery     Status: None   Collection Time: 10/02/18  6:05 AM  Result Value Ref Range   Prothrombin Time 13.3 11.4 - 15.2 seconds   INR 1.0 0.8 - 1.2    Comment: (NOTE) INR goal varies based on device and disease states. Performed at [facility], [place] [address], [place], [place] [address]   Comprehensive metabolic panel     Status: Abnormal   Collection Time: 10/02/18  6:05 AM  Result Value Ref Range   Sodium 136 135 - 145 mmol/L   Potassium 3.5 3.5 - 5.1 mmol/L   Chloride 98 98 - 111 mmol/L   CO2 29 22 -  32 mmol/L   Glucose, Bld 114 (H) 70 - 99 mg/dL   BUN 14 6 - 20 mg/dL   Creatinine, Ser 0.90 0.61 - 1.24 mg/dL   Calcium 9.1 8.9 - 10.3 mg/dL   Total Protein 6.8 6.5 - 8.1 g/dL   Albumin 3.3 (L) 3.5 - 5.0 g/dL   AST 33 15 - 41 U/L   ALT 31 0 - 44 U/L   Alkaline Phosphatase 55 38 - 126 U/L   Total Bilirubin 1.1 0.3 - 1.2 mg/dL   GFR calc non Af Amer >60 >60 mL/min   GFR calc Af Amer >60 >60 mL/min   Anion gap 9 5 - 15    Comment: Performed at [facility] [address], [place], [place] [address]   No results found.  Review of Systems  Constitutional: Negative.   HENT: Negative.   Eyes: Negative.   Respiratory: Negative.   Cardiovascular: Negative.   Skin: Negative.     Blood pressure (!) 141/99, pulse 95, temperature 98.5 F (36.9 C), temperature source Oral, resp. rate 20, height 5\' 11"  (1.803 m), weight (!) 151.3 kg, SpO2 99 %. Physical Exam  Constitutional: He appears well-developed and well-nourished.  HENT:  Head: Normocephalic and atraumatic.  Mouth/Throat: Oropharynx is clear and moist.  Eyes: Pupils are equal, round, and reactive to light. Conjunctivae are normal.  Neck: Normal range of motion. Neck supple.  Cardiovascular: Normal rate.  Respiratory: Effort normal.  GI: Soft.  Musculoskeletal: Normal range of motion.     Assessment/Plan Left sinusitis- discussed ESS and ready top proceed  Melissa Montane, MD 10/02/2018, 7:23 AM

## 2018-10-02 NOTE — Anesthesia Procedure Notes (Addendum)
Procedure Name: Intubation Date/Time: 10/02/2018 7:49 AM Performed by: Bryson Corona, CRNA Pre-anesthesia Checklist: Patient identified, Emergency Drugs available, Suction available and Patient being monitored Patient Re-evaluated:Patient Re-evaluated prior to induction Oxygen Delivery Method: Circle System Utilized Preoxygenation: Pre-oxygenation with 100% oxygen Induction Type: IV induction and Rapid sequence Ventilation: Two handed mask ventilation required Laryngoscope Size: Glidescope and 3 Grade View: Grade I Tube type: Oral Tube size: 7.0 mm Number of attempts: 2 Airway Equipment and Method: Stylet and Oral airway Placement Confirmation: ETT inserted through vocal cords under direct vision,  positive ETCO2 and breath sounds checked- equal and bilateral Secured at: 23 cm Tube secured with: Tape Dental Injury: Teeth and Oropharynx as per pre-operative assessment  Future Recommendations: Recommend- induction with short-acting agent, and alternative techniques readily available Comments: DL x 1 with MAC 4. Grade 2b/3 view. Unable to pass ETT due to getting caught on arytenoids. Mask ventilation between DL attempts. DL x 2 with glidescope 3 grade 1 view. Easily passed 7.0 ETT.

## 2018-10-02 NOTE — Anesthesia Postprocedure Evaluation (Signed)
Anesthesia Post Note  Patient: Frank Morales  Procedure(s) Performed: SINUS ENDO WITH FUSION (N/A Nose)     Patient location during evaluation: PACU Anesthesia Type: General Level of consciousness: sedated Pain management: pain level controlled Vital Signs Assessment: post-procedure vital signs reviewed and stable Respiratory status: spontaneous breathing and respiratory function stable Cardiovascular status: stable Postop Assessment: no apparent nausea or vomiting Anesthetic complications: no    Last Vitals:  Vitals:   10/02/18 1011 10/02/18 1015  BP: 122/75   Pulse: 79 84  Resp: (!) 21 20  Temp:    SpO2: 97% 98%    Last Pain:  Vitals:   10/02/18 1030  TempSrc:   PainSc: 0-No pain                 Shaelynn Dragos DANIEL

## 2018-10-02 NOTE — Transfer of Care (Signed)
Immediate Anesthesia Transfer of Care Note  Patient: Frank Morales  Procedure(s) Performed: SINUS ENDO WITH FUSION (N/A Nose)  Patient Location: PACU  Anesthesia Type:General  Level of Consciousness: awake, alert  and oriented  Airway & Oxygen Therapy: Patient Spontanous Breathing and Patient connected to face mask oxygen  Post-op Assessment: Report given to RN and Post -op Vital signs reviewed and stable  Post vital signs: Reviewed and stable  Last Vitals:  Vitals Value Taken Time  BP 147/94 10/02/18 0839  Temp    Pulse 99 10/02/18 0840  Resp 22 10/02/18 0840  SpO2 97 % 10/02/18 0840  Vitals shown include unvalidated device data.  Last Pain:  Vitals:   10/02/18 0604  TempSrc:   PainSc: 0-No pain         Complications: No apparent anesthesia complications

## 2018-10-03 ENCOUNTER — Encounter (HOSPITAL_COMMUNITY): Payer: Self-pay | Admitting: Otolaryngology

## 2018-10-07 DIAGNOSIS — I509 Heart failure, unspecified: Secondary | ICD-10-CM | POA: Diagnosis not present

## 2018-10-07 DIAGNOSIS — I5032 Chronic diastolic (congestive) heart failure: Secondary | ICD-10-CM | POA: Diagnosis not present

## 2018-10-07 LAB — AEROBIC/ANAEROBIC CULTURE W GRAM STAIN (SURGICAL/DEEP WOUND): Culture: NORMAL

## 2018-10-12 DIAGNOSIS — J324 Chronic pansinusitis: Secondary | ICD-10-CM | POA: Diagnosis not present

## 2018-10-17 DIAGNOSIS — I509 Heart failure, unspecified: Secondary | ICD-10-CM | POA: Diagnosis not present

## 2018-10-17 DIAGNOSIS — I5032 Chronic diastolic (congestive) heart failure: Secondary | ICD-10-CM | POA: Diagnosis not present

## 2018-10-19 DIAGNOSIS — J324 Chronic pansinusitis: Secondary | ICD-10-CM | POA: Diagnosis not present

## 2018-11-07 DIAGNOSIS — I5032 Chronic diastolic (congestive) heart failure: Secondary | ICD-10-CM | POA: Diagnosis not present

## 2018-11-07 DIAGNOSIS — I509 Heart failure, unspecified: Secondary | ICD-10-CM | POA: Diagnosis not present

## 2018-11-14 ENCOUNTER — Other Ambulatory Visit: Payer: Self-pay | Admitting: Internal Medicine

## 2018-11-14 DIAGNOSIS — R0981 Nasal congestion: Secondary | ICD-10-CM

## 2018-11-15 DIAGNOSIS — Z23 Encounter for immunization: Secondary | ICD-10-CM | POA: Diagnosis not present

## 2018-11-16 NOTE — Telephone Encounter (Signed)
Pt's pharmacy is requesting a refill on loratadine. Would Dr. Harrington Challenger like to refill this medication? Please address

## 2018-11-17 DIAGNOSIS — I509 Heart failure, unspecified: Secondary | ICD-10-CM | POA: Diagnosis not present

## 2018-11-17 DIAGNOSIS — I5032 Chronic diastolic (congestive) heart failure: Secondary | ICD-10-CM | POA: Diagnosis not present

## 2018-11-25 DIAGNOSIS — J324 Chronic pansinusitis: Secondary | ICD-10-CM | POA: Diagnosis not present

## 2018-11-25 DIAGNOSIS — J3089 Other allergic rhinitis: Secondary | ICD-10-CM | POA: Diagnosis not present

## 2018-12-08 DIAGNOSIS — I5032 Chronic diastolic (congestive) heart failure: Secondary | ICD-10-CM | POA: Diagnosis not present

## 2018-12-08 DIAGNOSIS — I509 Heart failure, unspecified: Secondary | ICD-10-CM | POA: Diagnosis not present

## 2018-12-15 ENCOUNTER — Telehealth: Payer: Self-pay | Admitting: Pharmacist

## 2018-12-17 DIAGNOSIS — I5032 Chronic diastolic (congestive) heart failure: Secondary | ICD-10-CM | POA: Diagnosis not present

## 2018-12-17 DIAGNOSIS — I509 Heart failure, unspecified: Secondary | ICD-10-CM | POA: Diagnosis not present

## 2019-01-01 NOTE — Telephone Encounter (Signed)
Tried calling patient back but unable to reach

## 2019-01-06 DIAGNOSIS — H52223 Regular astigmatism, bilateral: Secondary | ICD-10-CM | POA: Diagnosis not present

## 2019-01-06 DIAGNOSIS — H5203 Hypermetropia, bilateral: Secondary | ICD-10-CM | POA: Diagnosis not present

## 2019-01-07 DIAGNOSIS — I509 Heart failure, unspecified: Secondary | ICD-10-CM | POA: Diagnosis not present

## 2019-01-07 DIAGNOSIS — I5032 Chronic diastolic (congestive) heart failure: Secondary | ICD-10-CM | POA: Diagnosis not present

## 2019-01-12 ENCOUNTER — Other Ambulatory Visit: Payer: Self-pay | Admitting: Internal Medicine

## 2019-01-12 DIAGNOSIS — R0981 Nasal congestion: Secondary | ICD-10-CM

## 2019-01-12 DIAGNOSIS — H5213 Myopia, bilateral: Secondary | ICD-10-CM | POA: Diagnosis not present

## 2019-01-13 NOTE — Telephone Encounter (Signed)
Requesting all meds to be filled @  [facility] - [address], [place] AT [place] [phone] (Phone) [phone] (Fax)

## 2019-01-17 DIAGNOSIS — I509 Heart failure, unspecified: Secondary | ICD-10-CM | POA: Diagnosis not present

## 2019-01-17 DIAGNOSIS — I5032 Chronic diastolic (congestive) heart failure: Secondary | ICD-10-CM | POA: Diagnosis not present

## 2019-01-18 ENCOUNTER — Ambulatory Visit (INDEPENDENT_AMBULATORY_CARE_PROVIDER_SITE_OTHER): Payer: Medicaid Other | Admitting: Internal Medicine

## 2019-01-18 ENCOUNTER — Encounter: Payer: Self-pay | Admitting: Internal Medicine

## 2019-01-18 ENCOUNTER — Other Ambulatory Visit: Payer: Self-pay

## 2019-01-18 VITALS — BP 119/74 | HR 107 | Temp 98.5°F | Ht 71.0 in | Wt 350.0 lb

## 2019-01-18 DIAGNOSIS — Z79899 Other long term (current) drug therapy: Secondary | ICD-10-CM

## 2019-01-18 DIAGNOSIS — I5030 Unspecified diastolic (congestive) heart failure: Secondary | ICD-10-CM | POA: Diagnosis not present

## 2019-01-18 DIAGNOSIS — I110 Hypertensive heart disease with heart failure: Secondary | ICD-10-CM

## 2019-01-18 DIAGNOSIS — R0981 Nasal congestion: Secondary | ICD-10-CM

## 2019-01-18 DIAGNOSIS — Z720 Tobacco use: Secondary | ICD-10-CM

## 2019-01-18 DIAGNOSIS — J329 Chronic sinusitis, unspecified: Secondary | ICD-10-CM | POA: Diagnosis not present

## 2019-01-18 DIAGNOSIS — I5032 Chronic diastolic (congestive) heart failure: Secondary | ICD-10-CM

## 2019-01-18 DIAGNOSIS — Z9981 Dependence on supplemental oxygen: Secondary | ICD-10-CM

## 2019-01-18 DIAGNOSIS — I10 Essential (primary) hypertension: Secondary | ICD-10-CM

## 2019-01-18 MED ORDER — FUROSEMIDE 80 MG PO TABS: 80.0000 mg | ORAL_TABLET | Freq: Two times a day (BID) | ORAL | 1 refills | Status: DC

## 2019-01-18 NOTE — Progress Notes (Signed)
Patient SpO2 at rest on 4 L Oxygen = 98% P 106  Patient SpO2 on room air while ambulating = 85% P 112  SpO2 back at rest on 3 L Oxygen = 97% P 102

## 2019-01-18 NOTE — Patient Instructions (Signed)
[name],  It was a pleasure to see you today. Thank you for coming in.   Today we discussed your blood pressure, sinus issues, and oxygen requirement.   We checked your oxygen saturation when you walked and it looks like you still are requiring oxygen.  Please follow up with ENT in regards to your sinus issues.  Please continue taking your other medications as prescribed.   Please return to clinic in 6 months or sooner if needed.   Thank you again for coming in.   [name]

## 2019-01-18 NOTE — Progress Notes (Signed)
CC: HFpEF, HTN, sinusitis  HPI:  [name] is a 58 y.o.  with a PMH listed below presenting for HFpEF, HTN, sinusitis   Please see A&P for status of the patient's chronic medical conditions  Past Medical History:  Diagnosis Date  . Atrial fibrillation and flutter ([place]) 08/17/2015   A. S/p failed DCCV // b. Severe BAE on echo >> rate control strategy (has seen AF clinic) // c. Xarelto for anticoag (CHADS2-VASc=2 / CHF, HTN)  . Chronic diastolic CHF (congestive heart failure) ([place]) 08/30/2015   A. Echo 6/17: Apical HK, moderate focal basal and mild concentric LVH, EF 50-55, diffuse HK, trivial MR, severe BAE, mild TR, PASP 37  . Dependence on continuous supplemental oxygen    3L  . Hepatitis C, chronic ([place]) 08/19/2015  . History of cardiac catheterization    a. LHC 6/17: LAD irregs, o/w no CAD  . Hypertension   . OSA (obstructive sleep apnea) 11/21/2015   No Cpap  . Sleep apnea   . Tobacco abuse    Review of Systems: Refer to history of present illness and assessment and plans for pertinent review of systems, all others reviewed and negative.  Physical Exam:  Vitals:   01/18/19 1525  BP: 119/74  Pulse: (!) 107  Temp: 98.5 F (36.9 C)  TempSrc: Oral  SpO2: 97%  Weight: (!) 350 lb (158.8 kg)  Height: 5\' 11"  (1.803 m)    Physical Exam  Constitutional: He is oriented to person, place, and time.  Obese male, NAD, sitting in chair, nasal cannula in place  HENT:  Head: Normocephalic and atraumatic.  Cardiovascular: Normal rate, regular rhythm and normal heart sounds.  Pulmonary/Chest: Effort normal and breath sounds normal. No respiratory distress.  Abdominal: Soft. Bowel sounds are normal. He exhibits no distension.  Musculoskeletal:        General: Edema (BL LE edema) present.  Neurological: He is alert and oriented to person, place, and time.  Skin: Skin is warm.  Psychiatric: Mood and affect normal.    Social History   Socioeconomic History  . Marital  status: Single    Spouse name: Not on file  . Number of children: Not on file  . Years of education: Not on file  . Highest education level: Not on file  Occupational History  . Occupation: unemployed    Comment: Previous Soil scientist  . Financial resource strain: Not on file  . Food insecurity    Worry: Not on file    Inability: Not on file  . Transportation needs    Medical: Not on file    Non-medical: Not on file  Tobacco Use  . Smoking status: Current Every Day Smoker    Packs/day: 0.20    Types: Cigarettes  . Smokeless tobacco: Never Used  . Tobacco comment: 1 pack per week  Substance and Sexual Activity  . Alcohol use: Yes    Alcohol/week: 5.0 standard drinks    Types: 5 Standard drinks or equivalent per week    Comment: 2 times a week beer  . Drug use: No  . Sexual activity: Not on file  Lifestyle  . Physical activity    Days per week: Not on file    Minutes per session: Not on file  . Stress: Not on file  Relationships  . Social connections    Talks on phone: Patient refused    Gets together: Patient refused    Attends religious service: Patient refused  Active member of club or organization: Patient refused    Attends meetings of clubs or organizations: Patient refused    Relationship status: Patient refused  . Intimate partner violence    Fear of current or ex partner: Patient refused    Emotionally abused: Patient refused    Physically abused: Patient refused    Forced sexual activity: Patient refused  Other Topics Concern  . Not on file  Social History Narrative  . Not on file   Family History  Problem Relation Age of Onset  . Hypertension Mother   . Diabetes Mother   . Pneumonia Father   . Hypertension Maternal Grandfather   . Colon cancer Neg Hx     Assessment & Plan:   See Encounters Tab for problem based charting.  Patient discussed with Dr. Philipp Ovens

## 2019-01-19 NOTE — Assessment & Plan Note (Signed)
Follows with ENT at [facility], underwent surgery for his chronic sinusitis on 10/02/18.  Patient reports that after the surgery his symptoms had improved but they are slowly returning, feels like he is getting stuffed up. Denies any fevers, chills, nausea, vomiting, or other symptoms. He is currently on Claritin, Flonase, azelastine, and Zyrtec. Patient will follow with ENT.

## 2019-01-19 NOTE — Assessment & Plan Note (Signed)
Currently on lasix 80 mg BID, and diltaizem 240 mg daily for his heart failure. BP today is well controlled at 119/74. Denies any issues taking his medications.  -Continue current regimen

## 2019-01-19 NOTE — Assessment & Plan Note (Signed)
Patient is currently taking Lasix 80 mg daily.  Follows with cardiology.  He reports that his breathing is doing okay at this time, he is currently on 3.5 L of oxygen at home at all times.  He denies any chest pain, nausea, vomiting, fevers, chills, or other symptoms.  On exam he does have 1+ bilateral lower extremity edema, appears comfortable and lungs are CTA BL.  Did a ambulatory oxygen saturation test that showed his oxygen dropped to 85% with ambulation, improved with 3 L of oxygen.  -Continue Lasix 80 mg twice daily -Continue compression stockings -Continue to follow with cardiology

## 2019-01-22 NOTE — Progress Notes (Deleted)
Internal Medicine Clinic Attending  Case discussed with Dr. Krienke at the time of the visit.  We reviewed the resident's history and exam and pertinent patient test results.  I agree with the assessment, diagnosis, and plan of care documented in the resident's note.    

## 2019-01-22 NOTE — Progress Notes (Signed)
Internal Medicine Clinic Attending  Case discussed with Dr. Krienke at the time of the visit.  We reviewed the resident's history and exam and pertinent patient test results.  I agree with the assessment, diagnosis, and plan of care documented in the resident's note.    

## 2019-02-08 DIAGNOSIS — J324 Chronic pansinusitis: Secondary | ICD-10-CM | POA: Diagnosis not present

## 2019-02-12 ENCOUNTER — Other Ambulatory Visit: Payer: Self-pay

## 2019-02-12 ENCOUNTER — Ambulatory Visit (HOSPITAL_COMMUNITY)
Admission: EM | Admit: 2019-02-12 | Discharge: 2019-02-12 | Disposition: A | Discharge status: Home / Self Care | Payer: Medicaid Other

## 2019-02-12 ENCOUNTER — Encounter (HOSPITAL_COMMUNITY): Payer: Self-pay

## 2019-02-12 ENCOUNTER — Emergency Department (HOSPITAL_COMMUNITY): Payer: Medicaid Other

## 2019-02-12 ENCOUNTER — Emergency Department (HOSPITAL_COMMUNITY)
Admission: EM | Admit: 2019-02-12 | Discharge: 2019-02-12 | Disposition: A | Discharge status: Home / Self Care | Payer: Medicaid Other | Attending: Emergency Medicine | Admitting: Emergency Medicine

## 2019-02-12 DIAGNOSIS — N5089 Other specified disorders of the male genital organs: Secondary | ICD-10-CM

## 2019-02-12 DIAGNOSIS — N453 Epididymo-orchitis: Secondary | ICD-10-CM | POA: Diagnosis not present

## 2019-02-12 DIAGNOSIS — I4891 Unspecified atrial fibrillation: Secondary | ICD-10-CM | POA: Insufficient documentation

## 2019-02-12 DIAGNOSIS — Z7901 Long term (current) use of anticoagulants: Secondary | ICD-10-CM | POA: Insufficient documentation

## 2019-02-12 DIAGNOSIS — Z79899 Other long term (current) drug therapy: Secondary | ICD-10-CM | POA: Diagnosis not present

## 2019-02-12 DIAGNOSIS — I110 Hypertensive heart disease with heart failure: Secondary | ICD-10-CM | POA: Diagnosis not present

## 2019-02-12 DIAGNOSIS — N433 Hydrocele, unspecified: Secondary | ICD-10-CM | POA: Diagnosis not present

## 2019-02-12 DIAGNOSIS — N5082 Scrotal pain: Secondary | ICD-10-CM

## 2019-02-12 DIAGNOSIS — R600 Localized edema: Secondary | ICD-10-CM | POA: Insufficient documentation

## 2019-02-12 DIAGNOSIS — B182 Chronic viral hepatitis C: Secondary | ICD-10-CM | POA: Insufficient documentation

## 2019-02-12 DIAGNOSIS — F17210 Nicotine dependence, cigarettes, uncomplicated: Secondary | ICD-10-CM | POA: Insufficient documentation

## 2019-02-12 DIAGNOSIS — I5032 Chronic diastolic (congestive) heart failure: Secondary | ICD-10-CM | POA: Diagnosis not present

## 2019-02-12 LAB — CBC WITH DIFFERENTIAL/PLATELET
Abs Immature Granulocytes: 0.01 10*3/uL (ref 0.00–0.07)
Basophils Absolute: 0 10*3/uL (ref 0.0–0.1)
Basophils Relative: 1 %
Eosinophils Absolute: 0.1 10*3/uL (ref 0.0–0.5)
Eosinophils Relative: 2 %
HCT: 40.5 % (ref 39.0–52.0)
Hemoglobin: 13.1 g/dL (ref 13.0–17.0)
Immature Granulocytes: 0 %
Lymphocytes Relative: 21 %
Lymphs Abs: 0.9 10*3/uL (ref 0.7–4.0)
MCH: 31.7 pg (ref 26.0–34.0)
MCHC: 32.3 g/dL (ref 30.0–36.0)
MCV: 98.1 fL (ref 80.0–100.0)
Monocytes Absolute: 0.8 10*3/uL (ref 0.1–1.0)
Monocytes Relative: 20 %
Neutro Abs: 2.3 10*3/uL (ref 1.7–7.7)
Neutrophils Relative %: 56 %
Platelets: 150 10*3/uL (ref 150–400)
RBC: 4.13 MIL/uL — ABNORMAL LOW (ref 4.22–5.81)
RDW: 14.3 % (ref 11.5–15.5)
WBC: 4.1 10*3/uL (ref 4.0–10.5)
nRBC: 0 % (ref 0.0–0.2)

## 2019-02-12 LAB — URINALYSIS, ROUTINE W REFLEX MICROSCOPIC
Bilirubin Urine: NEGATIVE
Glucose, UA: NEGATIVE mg/dL
Ketones, ur: NEGATIVE mg/dL
Nitrite: NEGATIVE
Protein, ur: NEGATIVE mg/dL
Specific Gravity, Urine: 1.017 (ref 1.005–1.030)
pH: 5 (ref 5.0–8.0)

## 2019-02-12 LAB — BASIC METABOLIC PANEL
Anion gap: 7 (ref 5–15)
BUN: 10 mg/dL (ref 6–20)
CO2: 29 mmol/L (ref 22–32)
Calcium: 8.7 mg/dL — ABNORMAL LOW (ref 8.9–10.3)
Chloride: 101 mmol/L (ref 98–111)
Creatinine, Ser: 0.7 mg/dL (ref 0.61–1.24)
GFR calc Af Amer: >60 mL/min (ref >60)
GFR calc non Af Amer: >60 mL/min (ref >60)
Glucose, Bld: 102 mg/dL — ABNORMAL HIGH (ref 70–99)
Potassium: 4.3 mmol/L (ref 3.5–5.1)
Sodium: 137 mmol/L (ref 135–145)

## 2019-02-12 MED ORDER — MORPHINE SULFATE (PF) 4 MG/ML IV SOLN
4.0000 mg | Freq: Once | INTRAVENOUS | Status: AC
  Administered 2019-02-12: 4 mg via INTRAVENOUS
  Filled 2019-02-12: qty 1

## 2019-02-12 MED ORDER — HYDROCODONE-ACETAMINOPHEN 5-325 MG PO TABS: 1.0000 | ORAL_TABLET | Freq: Four times a day (QID) | ORAL | 0 refills | Status: DC | PRN

## 2019-02-12 MED ORDER — LEVOFLOXACIN 500 MG PO TABS
500.0000 mg | ORAL_TABLET | Freq: Once | ORAL | Status: AC
  Administered 2019-02-12: 500 mg via ORAL
  Filled 2019-02-12: qty 1

## 2019-02-12 MED ORDER — LEVOFLOXACIN 500 MG PO TABS: 500.0000 mg | ORAL_TABLET | Freq: Every day | ORAL | 0 refills | Status: DC

## 2019-02-12 NOTE — ED Provider Notes (Signed)
[facility]    CSN: [id number] Arrival date & time: 02/12/19  1153      History   Chief Complaint Chief Complaint  Patient presents with  . strained muscle    HPI Frank Morales is a 58 y.o. male.   HPI  Patient seen today for left groin pain x 2 days. Patient complains of worsening of left scrotal pain and swelling. Pt has CHF endorses performing daily weights and has not noted in any change in weight >2 lbs.  10/10 pain with sitting and standing. Two days ago patient was moving a refrigerator and developed gradually worsening scrotal pain since that time. No history of hernia.  Past Medical History:  Diagnosis Date  . Atrial fibrillation and flutter ([place]) 08/17/2015   A. S/p failed DCCV // b. Severe BAE on echo >> rate control strategy (has seen AF clinic) // c. Xarelto for anticoag (CHADS2-VASc=2 / CHF, HTN)  . Chronic diastolic CHF (congestive heart failure) ([place]) 08/30/2015   A. Echo 6/17: Apical HK, moderate focal basal and mild concentric LVH, EF 50-55, diffuse HK, trivial MR, severe BAE, mild TR, PASP 37  . Dependence on continuous supplemental oxygen    3L  . Hepatitis C, chronic ([place]) 08/19/2015  . History of cardiac catheterization    a. LHC 6/17: LAD irregs, o/w no CAD  . Hypertension   . OSA (obstructive sleep apnea) 11/21/2015   No Cpap  . Sleep apnea   . Tobacco abuse     Patient Active Problem List   Diagnosis Date Noted  . Pleural effusion on right 03/14/2018  . Hyposmia 12/22/2017  . Tobacco abuse counseling   . Permanent atrial fibrillation   . Sinus congestion 03/03/2017  . Chronic dental pain 08/19/2016  . Grade I internal hemorrhoids   . 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 Contact dermatitis due to poison ivy 02/26/2016  . OSA (obstructive sleep apnea) 11/21/2015  . Chronic diastolic CHF (congestive heart failure) ([place]) 08/30/2015  . Hepatitis C, chronic ([place]) 08/19/2015  . Morbid obesity ([place]) 08/17/2015  . Hypertension 08/17/2015  .  Orthopnea 08/17/2015  . Bilateral leg edema 08/17/2015  . Current smoker 08/17/2015    Past Surgical History:  Procedure Laterality Date  . CARDIAC CATHETERIZATION N/A 08/21/2015   Procedure: Right/Left Heart Cath and Coronary Angiography;  Surgeon: Leonie Man, MD;  Location: [facility] LAB;  Service: Cardiovascular;  Laterality: N/A;  . CARDIOVERSION N/A 09/22/2015   Procedure: CARDIOVERSION;  Surgeon: Josue Hector, MD;  Location: [facility] ENDOSCOPY;  Service: Cardiovascular;  Laterality: N/A;  . COLONOSCOPY N/A 07/19/2016   Procedure: COLONOSCOPY;  Surgeon: Doran Stabler, MD;  Location: [facility] ENDOSCOPY;  Service: Gastroenterology;  Laterality: N/A;  . ESOPHAGOGASTRODUODENOSCOPY N/A 07/19/2016   Procedure: ESOPHAGOGASTRODUODENOSCOPY (EGD);  Surgeon: Doran Stabler, MD;  Location: [facility] ENDOSCOPY;  Service: Gastroenterology;  Laterality: N/A;  . NO PAST SURGERIES    . SINUS ENDO WITH FUSION N/A 10/02/2018   Procedure: SINUS ENDO WITH FUSION;  Surgeon: Melissa Montane, MD;  Location: [facility];  Service: ENT;  Laterality: N/A;       Home Medications    Prior to Admission medications   Medication Sig Start Date End Date Taking? Authorizing Provider  azelastine (ASTELIN) 0.1 % nasal spray INSTILL 2 SPRAYS INTO BOTH NOSTRILS TWICE DAILY 01/13/19   Lucious Groves, DO  cetirizine (ZYRTEC) 10 MG tablet Take 1 tablet (10 mg total) by mouth daily. 07/29/18   Lonia Skinner  M, MD  diltiazem (CARDIZEM CD) 240 MG 24 hr capsule TAKE 2 CAPSULES(480 MG) BY MOUTH DAILY 01/13/19   Lucious Groves, DO  fluticasone (FLONASE) 50 MCG/ACT nasal spray SHAKE LIQUID AND USE 1 SPRAY IN [facility] NOSTRIL DAILY 01/13/19   Lucious Groves, DO  furosemide (LASIX) 80 MG tablet Take 1 tablet (80 mg total) by mouth 2 (two) times daily. 01/18/19   [name], MD  loratadine (CLARITIN) 10 MG tablet TAKE 1 TABLET(10 MG) BY MOUTH DAILY 11/16/18   Fay Records, MD  potassium chloride (K-DUR) 10 MEQ tablet Take 6 tablets (60  mEq total) by mouth 2 (two) times daily. 04/28/18 01/18/19  [name], PA-C  rivaroxaban (XARELTO) 20 MG TABS tablet Take 1 tablet (20 mg total) by mouth daily with supper. 12/22/17   [name], MD    Family History Family History  Problem Relation Age of Onset  . Hypertension Mother   . Diabetes Mother   . Pneumonia Father   . Hypertension Maternal Grandfather   . Colon cancer Neg Hx     Social History Social History   Tobacco Use  . Smoking status: Current Every Day Smoker    Packs/day: 0.20    Types: Cigarettes  . Smokeless tobacco: Never Used  . Tobacco comment: 1 pack per week  Substance Use Topics  . Alcohol use: Yes    Alcohol/week: 5.0 standard drinks    Types: 5 Standard drinks or equivalent per week    Comment: 2 times a week beer  . Drug use: No     Allergies   Lactose intolerance (gi)   Review of Systems Review of Systems Pertinent negatives listed in HPI  Physical Exam Triage Vital Signs ED Triage Vitals  Enc Vitals Group     BP 02/12/19 1400 112/73     Pulse Rate 02/12/19 1400 89     Resp 02/12/19 1400 20     Temp 02/12/19 1400 98.5 F (36.9 C)     Temp Source 02/12/19 1400 Oral     SpO2 02/12/19 1400 91 %     Weight --      Height --      Head Circumference --      Peak Flow --      Pain Score 02/12/19 1405 10     Pain Loc --      Pain Edu? --      Excl. in [place]? --    No data found.  Updated Vital Signs BP 112/73 (BP Location: Left Arm)   Pulse 89   Temp 98.5 F (36.9 C) (Oral)   Resp 20   SpO2 91%   Visual Acuity Right Eye Distance:   Left Eye Distance:   Bilateral Distance:    Right Eye Near:   Left Eye Near:    Bilateral Near:     Physical Exam Exam conducted with a chaperone present.  Constitutional:      Appearance: He is ill-appearing.  Cardiovascular:     Rate and Rhythm: Normal rate.  Pulmonary:     Breath sounds: Decreased breath sounds present.     Comments: Prescribed 3 liters continuous oxygen   Genitourinary:    Penis: No discharge or lesions.      Scrotum/Testes:        Right: Swelling present.        Left: Mass, tenderness and swelling present.    Musculoskeletal:     Right lower leg: Edema  present.     Left lower leg: Edema present.  Neurological:     Mental Status: He is alert.        UC Treatments / Results  Labs (all labs ordered are listed, but only abnormal results are displayed) Labs Reviewed - No data to display  EKG   Radiology No results found.  Procedures Procedures (including critical care time)  Medications Ordered in UC Medications - No data to display  Initial Impression / Assessment and Plan / UC Course  I have reviewed the triage vital signs and the nursing notes.  Pertinent labs & imaging results that were available during my care of the patient were reviewed by me and considered in my medical decision making (see chart for details).    Patient transported to ER via wheelchair with 3 liters of oxygen via n/c for further evaluation and US imaging. Patient stable. Final Clinical Impressions(s) / UC Diagnoses   Final diagnoses:  Scrotal swelling  Scrotal pain     Discharge Instructions     Please follow-up at the emergency department immediately for imaging and evaluation of testicular swelling which appears to be scrotal hernia.     ED Prescriptions    None     PDMP not reviewed this encounter.   Scot Jun, FNP 02/14/19 [phone]

## 2019-02-12 NOTE — ED Triage Notes (Signed)
Pt presents with c/o groin pain/swelling since Wednesday after moving a refrigerator. Pt states he thinks he may have pulled a muscle in the process. Pt states he was sent from UC for ultrasound. Pt has hx of CHF, wears 3L O2 at all times.

## 2019-02-12 NOTE — ED Notes (Signed)
Patient transported to Ultrasound 

## 2019-02-12 NOTE — Discharge Instructions (Signed)
Take Levaquin as prescribed until completed.  For severe pain, take 1-2 Vicodin every 6 hours.  Elevate your scrotum as much as possible.  Use ice 3-4 times daily alternating 15 minutes on, 15 minutes off.  Please follow-up with Dr. Gloriann Loan with urology for further evaluation and treatment in the next 7-10 days.  Do not drink alcohol, drive, operate machinery or participate in any other potentially dangerous activities while taking opiate pain medication as it may make you sleepy. Do not take this medication with any other sedating medications, either prescription or over-the-counter. If you were prescribed Percocet or Vicodin, do not take these with acetaminophen (Tylenol) as it is already contained within these medications and overdose of Tylenol is dangerous.   This medication is an opiate (or narcotic) pain medication and can be habit forming.  Use it as little as possible to achieve adequate pain control.  Do not use or use it with extreme caution if you have a history of opiate abuse or dependence. This medication is intended for your use only - do not give any to anyone else and keep it in a secure place where nobody else, especially children, have access to it. It will also cause or worsen constipation, so you may want to consider taking an over-the-counter stool softener while you are taking this medication.

## 2019-02-12 NOTE — ED Notes (Signed)
Discharge instructions reviewed with pt. Pt verbalized understanding.   

## 2019-02-12 NOTE — ED Notes (Signed)
Request sent to material management for scrotal support device.

## 2019-02-12 NOTE — Discharge Instructions (Addendum)
Please follow-up at the emergency department immediately for imaging and evaluation of testicular swelling which appears to be scrotal hernia.

## 2019-02-12 NOTE — ED Provider Notes (Signed)
[facility] EMERGENCY DEPARTMENT Provider Note   CSN: [id number] Arrival date & time: 02/12/19  1506     History   Chief Complaint Chief Complaint  Patient presents with  . Groin Swelling    HPI Frank Morales is a 58 y.o. male with history of CHF, atrial fibrillation anticoagulated on Xarelto who presents with scrotal pain and swelling after moving a refrigerator 1 week ago.  He reports progressive worsening of swelling and pain.  He denies any penile discharge or difficulty urinating.  He takes diuretics twice daily.  He denies any worsening shortness of breath.  He wears 3 L of oxygen at baseline.  He denies any significant increase in leg swelling, abdominal pain, nausea, vomiting, chest pain.  No concern for STD exposure, patient not sexually active.     HPI  Past Medical History:  Diagnosis Date  . Atrial fibrillation and flutter ([place]) 08/17/2015   A. S/p failed DCCV // b. Severe BAE on echo >> rate control strategy (has seen AF clinic) // c. Xarelto for anticoag (CHADS2-VASc=2 / CHF, HTN)  . Chronic diastolic CHF (congestive heart failure) ([place]) 08/30/2015   A. Echo 6/17: Apical HK, moderate focal basal and mild concentric LVH, EF 50-55, diffuse HK, trivial MR, severe BAE, mild TR, PASP 37  . Dependence on continuous supplemental oxygen    3L  . Hepatitis C, chronic ([place]) 08/19/2015  . History of cardiac catheterization    a. LHC 6/17: LAD irregs, o/w no CAD  . Hypertension   . OSA (obstructive sleep apnea) 11/21/2015   No Cpap  . Sleep apnea   . Tobacco abuse     Patient Active Problem List   Diagnosis Date Noted  . Pleural effusion on right 03/14/2018  . Hyposmia 12/22/2017  . Tobacco abuse counseling   . Permanent atrial fibrillation   . Sinus congestion 03/03/2017  . Chronic dental pain 08/19/2016  . Grade I internal hemorrhoids   . 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 Contact dermatitis due to poison ivy 02/26/2016  . OSA (obstructive sleep  apnea) 11/21/2015  . Chronic diastolic CHF (congestive heart failure) ([place]) 08/30/2015  . Hepatitis C, chronic ([place]) 08/19/2015  . Morbid obesity ([place]) 08/17/2015  . Hypertension 08/17/2015  . Orthopnea 08/17/2015  . Bilateral leg edema 08/17/2015  . Current smoker 08/17/2015    Past Surgical History:  Procedure Laterality Date  . CARDIAC CATHETERIZATION N/A 08/21/2015   Procedure: Right/Left Heart Cath and Coronary Angiography;  Surgeon: Leonie Man, MD;  Location: [facility] LAB;  Service: Cardiovascular;  Laterality: N/A;  . CARDIOVERSION N/A 09/22/2015   Procedure: CARDIOVERSION;  Surgeon: Josue Hector, MD;  Location: [facility] ENDOSCOPY;  Service: Cardiovascular;  Laterality: N/A;  . COLONOSCOPY N/A 07/19/2016   Procedure: COLONOSCOPY;  Surgeon: Doran Stabler, MD;  Location: [facility] ENDOSCOPY;  Service: Gastroenterology;  Laterality: N/A;  . ESOPHAGOGASTRODUODENOSCOPY N/A 07/19/2016   Procedure: ESOPHAGOGASTRODUODENOSCOPY (EGD);  Surgeon: Doran Stabler, MD;  Location: [facility] ENDOSCOPY;  Service: Gastroenterology;  Laterality: N/A;  . NO PAST SURGERIES    . SINUS ENDO WITH FUSION N/A 10/02/2018   Procedure: SINUS ENDO WITH FUSION;  Surgeon: Melissa Montane, MD;  Location: [facility];  Service: ENT;  Laterality: N/A;        Home Medications    Prior to Admission medications   Medication Sig Start Date End Date Taking? Authorizing Provider  azelastine (ASTELIN) 0.1 % nasal spray INSTILL 2 SPRAYS INTO BOTH NOSTRILS TWICE  DAILY 01/13/19   Lucious Groves, DO  cetirizine (ZYRTEC) 10 MG tablet Take 1 tablet (10 mg total) by mouth daily. 07/29/18   [name], MD  diltiazem (CARDIZEM CD) 240 MG 24 hr capsule TAKE 2 CAPSULES(480 MG) BY MOUTH DAILY 01/13/19   Lucious Groves, DO  fluticasone (FLONASE) 50 MCG/ACT nasal spray SHAKE LIQUID AND USE 1 SPRAY IN [facility] NOSTRIL DAILY 01/13/19   Lucious Groves, DO  furosemide (LASIX) 80 MG tablet Take 1 tablet (80 mg total) by mouth 2 (two) times  daily. 01/18/19   [name], MD  HYDROcodone-acetaminophen (NORCO/VICODIN) 5-325 MG tablet Take 1-2 tablets by mouth every 6 (six) hours as needed for severe pain. 02/12/19   Ohana Birdwell, Bea Graff, PA-C  levofloxacin (LEVAQUIN) 500 MG tablet Take 1 tablet (500 mg total) by mouth daily. 02/12/19   Brooklinn Longbottom, Bea Graff, PA-C  loratadine (CLARITIN) 10 MG tablet TAKE 1 TABLET(10 MG) BY MOUTH DAILY 11/16/18   Fay Records, MD  potassium chloride (K-DUR) 10 MEQ tablet Take 6 tablets (60 mEq total) by mouth 2 (two) times daily. 04/28/18 01/18/19  [name], PA-C  rivaroxaban (XARELTO) 20 MG TABS tablet Take 1 tablet (20 mg total) by mouth daily with supper. 12/22/17   [name], MD    Family History Family History  Problem Relation Age of Onset  . Hypertension Mother   . Diabetes Mother   . Pneumonia Father   . Hypertension Maternal Grandfather   . Colon cancer Neg Hx     Social History Social History   Tobacco Use  . Smoking status: Current Every Day Smoker    Packs/day: 0.20    Types: Cigarettes  . Smokeless tobacco: Never Used  . Tobacco comment: 1 pack per week  Substance Use Topics  . Alcohol use: Yes    Alcohol/week: 5.0 standard drinks    Types: 5 Standard drinks or equivalent per week    Comment: 2 times a week beer  . Drug use: No     Allergies   Lactose intolerance (gi)   Review of Systems Review of Systems  Constitutional: Negative for chills and fever.  HENT: Negative for facial swelling and sore throat.   Respiratory: Positive for shortness of breath (baseline).   Cardiovascular: Negative for chest pain.  Gastrointestinal: Negative for abdominal pain, nausea and vomiting.  Genitourinary: Positive for frequency (baseline, diuretic), scrotal swelling and testicular pain. Negative for dysuria.  Musculoskeletal: Negative for back pain.  Skin: Negative for rash and wound.  Neurological: Negative for headaches.  Psychiatric/Behavioral: The patient is not  nervous/anxious.      Physical Exam Updated Vital Signs BP 123/68   Pulse 72   Temp 98.3 F (36.8 C) (Oral)   Resp 17   SpO2 99%   Physical Exam Vitals signs and nursing note reviewed. Exam conducted with a chaperone present.  Constitutional:      General: He is not in acute distress.    Appearance: He is well-developed. He is not diaphoretic.  HENT:     Head: Normocephalic and atraumatic.     Mouth/Throat:     Pharynx: No oropharyngeal exudate.  Eyes:     General: No scleral icterus.       Right eye: No discharge.        Left eye: No discharge.     Conjunctiva/sclera: Conjunctivae normal.     Pupils: Pupils are equal, round, and reactive to light.  Neck:  Musculoskeletal: Normal range of motion and neck supple.     Thyroid: No thyromegaly.  Cardiovascular:     Rate and Rhythm: Normal rate and regular rhythm.     Heart sounds: Normal heart sounds. No murmur. No friction rub. No gallop.   Pulmonary:     Effort: Pulmonary effort is normal. No respiratory distress.     Breath sounds: Normal breath sounds. No stridor. No wheezing or rales.     Comments: 3L Abdominal:     General: Bowel sounds are normal. There is no distension.     Palpations: Abdomen is soft.     Tenderness: There is no abdominal tenderness. There is no guarding or rebound.  Genitourinary:    Scrotum/Testes:        Right: Mass or tenderness not present.        Left: Mass (induration vs right), tenderness and swelling (significant) present.     Comments: Penis not visualized due to edema.  No perineal tenderness. Musculoskeletal:     Right lower leg: Edema (3+) present.     Left lower leg: Edema (3+) present.  Lymphadenopathy:     Cervical: No cervical adenopathy.  Skin:    General: Skin is warm and dry.     Coloration: Skin is not pale.     Findings: No rash.  Neurological:     Mental Status: He is alert.     Coordination: Coordination normal.      ED Treatments / Results  Labs (all  labs ordered are listed, but only abnormal results are displayed) Labs Reviewed  BASIC METABOLIC PANEL - Abnormal; Notable for the following components:      Result Value   Glucose, Bld 102 (*)    Calcium 8.7 (*)    All other components within normal limits  CBC WITH DIFFERENTIAL/PLATELET - Abnormal; Notable for the following components:   RBC 4.13 (*)    All other components within normal limits  URINALYSIS, ROUTINE W REFLEX MICROSCOPIC - Abnormal; Notable for the following components:   Hgb urine dipstick SMALL (*)    Leukocytes,Ua SMALL (*)    Bacteria, UA RARE (*)    All other components within normal limits  URINE CULTURE    EKG None  Radiology US Scrotum W/doppler  Result Date: 02/12/2019 CLINICAL DATA:  LEFT scrotal pain and swelling since Wednesday EXAM: SCROTAL ULTRASOUND DOPPLER ULTRASOUND OF THE TESTICLES TECHNIQUE: Complete ultrasound examination of the testicles, epididymis, and other scrotal structures was performed. Color and spectral Doppler ultrasound were also utilized to evaluate blood flow to the testicles. COMPARISON:  None FINDINGS: Right testicle Measurements: 4.0 x 2.5 x 3.0 cm. Normal echogenicity without mass or calcification. Internal blood flow present on color Doppler imaging. Left testicle Measurements: 3.4 x 2.8 x 2.9 cm. Normal echogenicity without mass or calcification. Internal blood flow present on color Doppler imaging, increased versus RIGHT Right epididymis:  Small epididymal cyst 3 mm greatest diameter Left epididymis: 2 small epididymal cysts measuring 5 mm and 3 mm in greatest sizes. Hypervascular LEFT epididymis versus RIGHT. Hydrocele: Small RIGHT hydrocele, simple character. Larger LEFT hydrocele, loculated/septated. Varicocele:  None visualized. Pulsed Doppler interrogation of both testes demonstrates normal low resistance arterial and venous waveforms bilaterally. Diffuse scrotal wall/skin thickening. Diffuse hypervascularity of LEFT testis, LEFT  epididymis and LEFT hemiscrotum versus RIGHT. IMPRESSION: LEFT epididymo-orchitis. Tiny BILATERAL epididymal cysts. Mildly complicated LEFT hydrocele with scattered thin septations but no significant debris, nonspecific, acute pyocele considered unlikely but not excluded. Smaller RIGHT simple  hydrocele. Electronically Signed   By: Lavonia Dana M.D.   On: 02/12/2019 16:44    Procedures Procedures (including critical care time)  Medications Ordered in ED Medications  levofloxacin (LEVAQUIN) tablet 500 mg (has no administration in time range)  morphine 4 MG/ML injection 4 mg (4 mg Intravenous Given 02/12/19 1732)     Initial Impression / Assessment and Plan / ED Course  I have reviewed the triage vital signs and the nursing notes.  Pertinent labs & imaging results that were available during my care of the patient were reviewed by me and considered in my medical decision making (see chart for details).        Patient found to have a left epididymal orchitis, bilateral hydroceles.  UA shows no signs of infection.  Will treat with Levaquin for orchitis.  Scrotal support advised.  Ice advised.  Unlikely related to CHF as patient has no increase in swelling or shortness of breath.  Will discharge home with Vicodin for pain control.  Will refer to urology for further evaluation and recheck.  Return precautions discussed.  Patient understands and agrees with plan.  Patient vitals stable throughout ED course and discharged in satisfactory condition.  Patient also guided by my attending, Dr. Roslynn Amble, who guided the patient's management and agrees with plan.  Final Clinical Impressions(s) / ED Diagnoses   Final diagnoses:  Epididymo-orchitis    ED Discharge Orders         Ordered    levofloxacin (LEVAQUIN) 500 MG tablet  Daily     02/12/19 1824    HYDROcodone-acetaminophen (NORCO/VICODIN) 5-325 MG tablet  Every 6 hours PRN     02/12/19 1824           Frederica Kuster, PA-C 02/12/19 1858     Lucrezia Starch, MD 02/13/19 1139

## 2019-02-12 NOTE — ED Notes (Signed)
Patient is being discharged from the [facility] and sent to the Emergency Department via wheelchair by staff. Per Provider Lavell Anchors, patient is stable but in need of higher level of care due to significant scrotal swelling. Patient is aware and verbalizes understanding of plan of care.   Vitals:   02/12/19 1400 02/12/19 1455  BP: 112/73 124/63  Pulse: 89 85  Resp: 20   Temp: 98.5 F (36.9 C)   SpO2: 91% 98%

## 2019-02-12 NOTE — ED Triage Notes (Signed)
Pt presents to UC w/ c/o straining himself while moving a refrigerator. Pt states he feels he pulled a muscle in his left groin 2 days ago.

## 2019-02-13 LAB — URINE CULTURE: Culture: <10000 — AB

## 2019-02-15 DIAGNOSIS — H524 Presbyopia: Secondary | ICD-10-CM | POA: Diagnosis not present

## 2019-02-15 DIAGNOSIS — H52223 Regular astigmatism, bilateral: Secondary | ICD-10-CM | POA: Diagnosis not present

## 2019-02-16 ENCOUNTER — Other Ambulatory Visit: Payer: Self-pay | Admitting: Internal Medicine

## 2019-02-16 DIAGNOSIS — I5033 Acute on chronic diastolic (congestive) heart failure: Secondary | ICD-10-CM

## 2019-02-16 DIAGNOSIS — I5032 Chronic diastolic (congestive) heart failure: Secondary | ICD-10-CM

## 2019-02-16 DIAGNOSIS — I509 Heart failure, unspecified: Secondary | ICD-10-CM | POA: Diagnosis not present

## 2019-02-16 DIAGNOSIS — I4892 Unspecified atrial flutter: Secondary | ICD-10-CM

## 2019-02-16 NOTE — Telephone Encounter (Signed)
Needs refill on rivaroxaban (XARELTO) 20 MG TABS tablet potassium chloride (K-DUR) 10 MEQ tablet(Expired)   furosemide (LASIX) 80 MG tablet     ;pt contact [phone]   [id number] - [place], [place] - [address] AT [place]

## 2019-02-17 ENCOUNTER — Telehealth: Payer: Self-pay | Admitting: Internal Medicine

## 2019-02-17 NOTE — Telephone Encounter (Signed)
Pt calls and states he lives in a house with his uncle and other family members. Uncle was taken by ems to hosp in last few days, he does not remember exactly when. Uncle tested +POSITIVE+ for COVID. He is advised to quarantine for 14 days, wear mask when around others, stay 6 ft away from others. [place] well, keep surfaces very clean, drink plenty of fluid, rest. Air his house out for a little while every day. Keep hands away from face. If he does not have a dishwasher to put a little clorox in dishwater, use hot water to wash and rinse and air dry completely. Also to wash linens well.  If he develops fever, dry cough, aches over body, short of breath, chest pain, h/a to call 911 or go to nearest ED and tell the staff as soon as he arrives about COVID. Also can go to testing site or definitely go if symptoms start and do as the staff at testing site tells him. Ask him to share all with his family members in the home. He has done so as we spoke.  Pt was agreeable to all and repeated back, ask him to call for any questions he may have

## 2019-02-17 NOTE — Telephone Encounter (Signed)
Pt reporting he lives with his Uncle who has Pneumonia and tested Positive for Covid-19.  Pt has some questions about taking a Covid-19 test and would like for a nurse to call him back.

## 2019-02-17 NOTE — Telephone Encounter (Signed)
Agree with instructions, given close personal contact needs to have 14 day quarantine, if symptoms develop he can call and we can arrarge for testing. If severe symptoms (Chest pain, SOB) needs to go to ED.

## 2019-02-18 DIAGNOSIS — Z20828 Contact with and (suspected) exposure to other viral communicable diseases: Secondary | ICD-10-CM | POA: Diagnosis not present

## 2019-02-18 MED ORDER — POTASSIUM CHLORIDE ER 10 MEQ PO TBCR: 60.0000 meq | EXTENDED_RELEASE_TABLET | Freq: Two times a day (BID) | ORAL | 1 refills | Status: DC

## 2019-02-20 IMAGING — DX DG CHEST 1V PORT
1 series · 1 of 1 positions shown · non-contrast
Comparison: July 03, 2017

CLINICAL DATA: Abdominal swelling.  Shortness of breath.

EXAM:
PORTABLE CHEST 1 VIEW

[chest]
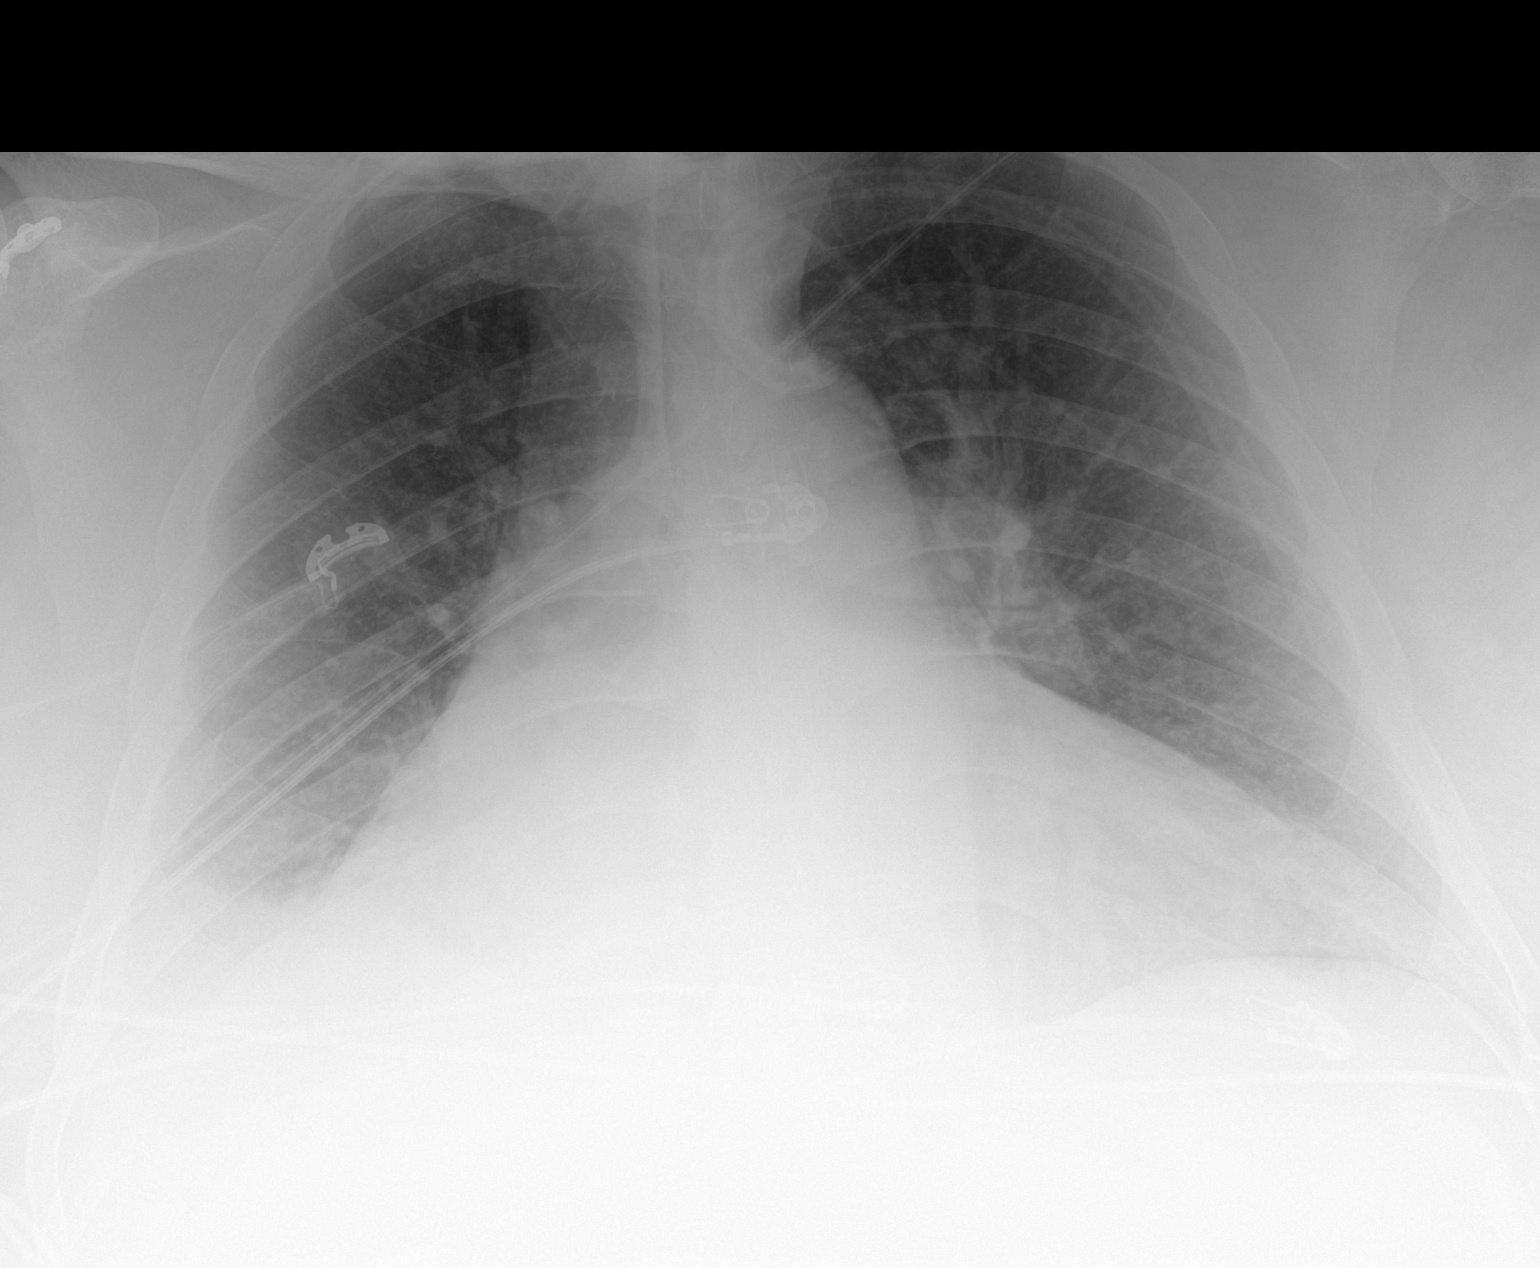

[1 of 1 positions shown; findings below may reference images not displayed]

FINDINGS: Stable cardiomegaly. The hila and mediastinum are unchanged. No
pulmonary nodules or masses. Possible small right effusion with
underlying atelectasis. No other acute abnormalities.
IMPRESSION: Cardiomegaly without overt edema. Possible small right effusion with
underlying atelectasis.

## 2019-03-09 DIAGNOSIS — I509 Heart failure, unspecified: Secondary | ICD-10-CM | POA: Diagnosis not present

## 2019-03-09 DIAGNOSIS — I5032 Chronic diastolic (congestive) heart failure: Secondary | ICD-10-CM | POA: Diagnosis not present

## 2019-03-19 DIAGNOSIS — I509 Heart failure, unspecified: Secondary | ICD-10-CM | POA: Diagnosis not present

## 2019-03-19 DIAGNOSIS — I5032 Chronic diastolic (congestive) heart failure: Secondary | ICD-10-CM | POA: Diagnosis not present

## 2019-04-09 DIAGNOSIS — I509 Heart failure, unspecified: Secondary | ICD-10-CM | POA: Diagnosis not present

## 2019-04-09 DIAGNOSIS — I5032 Chronic diastolic (congestive) heart failure: Secondary | ICD-10-CM | POA: Diagnosis not present

## 2019-04-13 IMAGING — US US THORACENTESIS ASP PLEURAL SPACE W/IMG GUIDE
1 series · 3 of 3 positions shown · non-contrast
Comparison: none

INDICATION: Shortness of breath. Right-sided pleural effusion. Request for
right-sided thoracentesis.

[Series 1: us thoracentesis asp pleural space w/img guide · 3 of 3 slices shown]
[im 1/3]
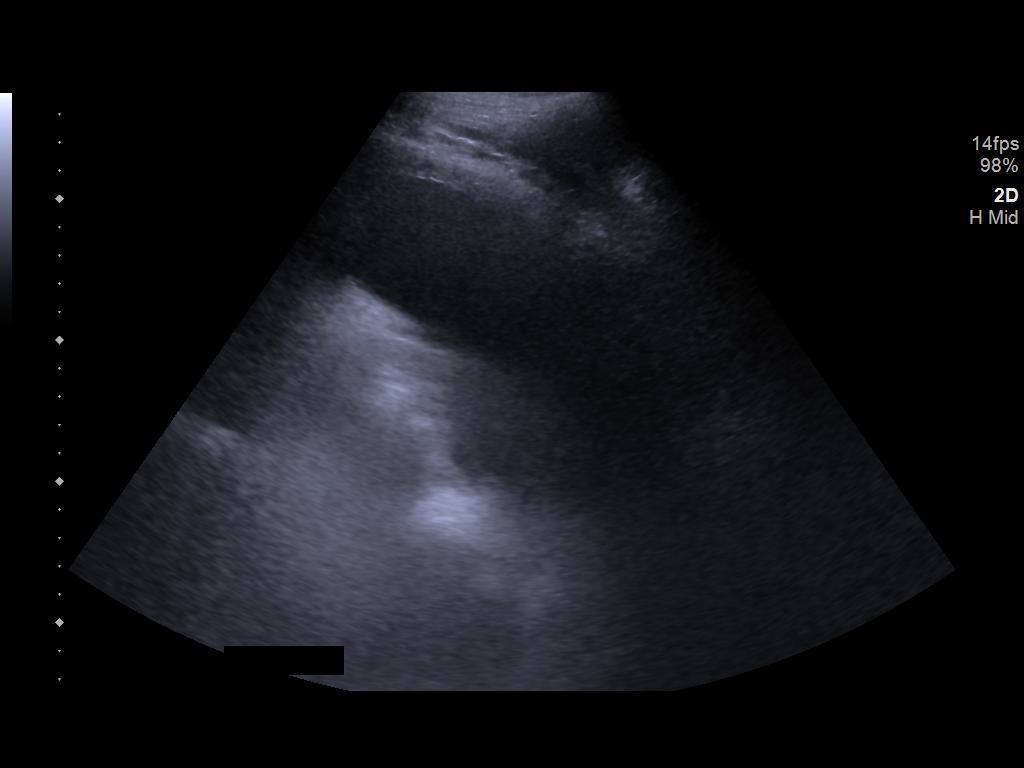
[im 2/3]
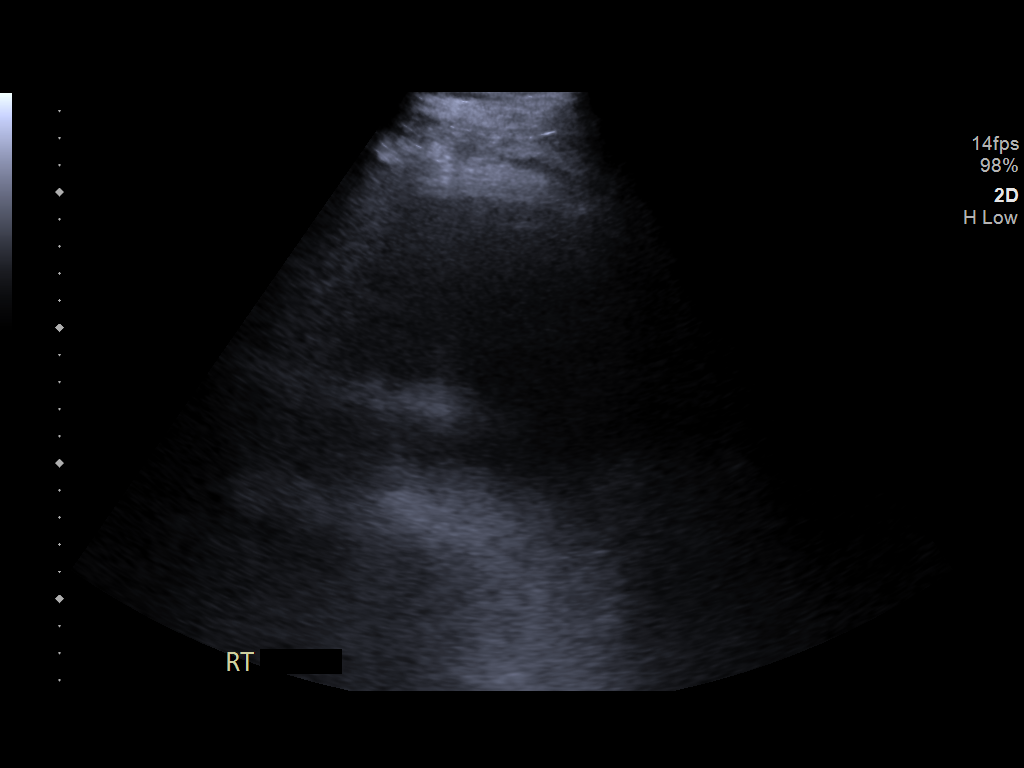
[im 3/3]
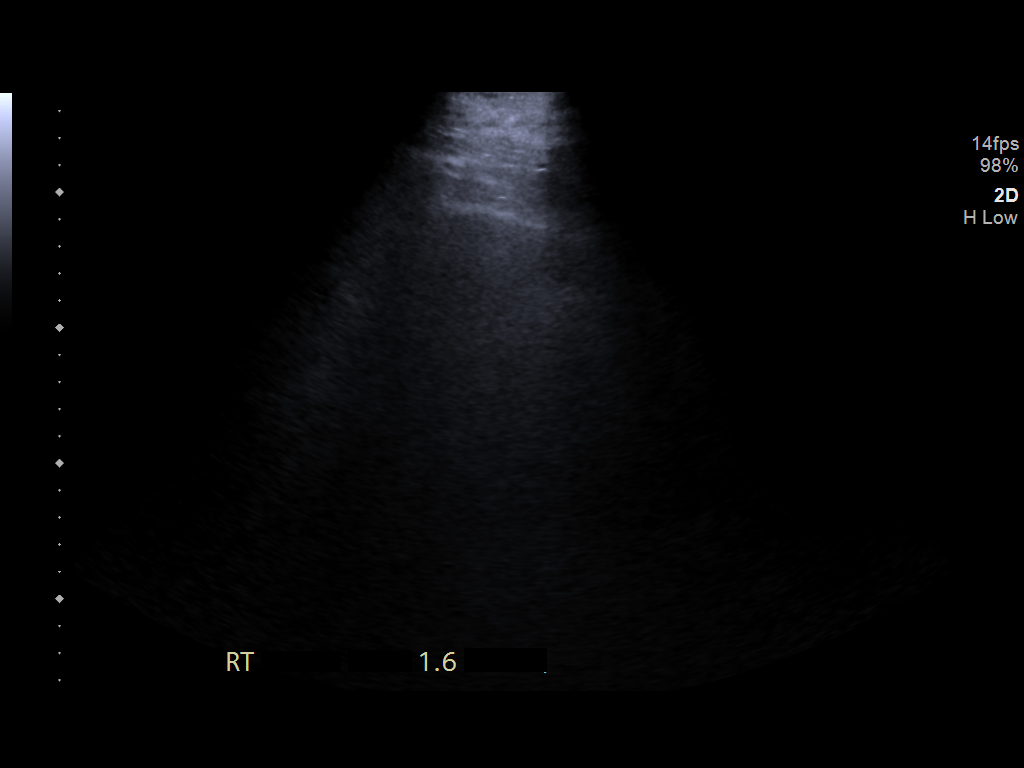

[3 of 3 positions shown; findings below may reference images not displayed]

EXAM:
ULTRASOUND GUIDED RIGHT THORACENTESIS

MEDICATIONS:
None.

COMPLICATIONS:
None immediate.

PROCEDURE:
An ultrasound guided thoracentesis was thoroughly discussed with the
patient and questions answered. The benefits, risks, alternatives
and complications were also discussed. The patient understands and
wishes to proceed with the procedure. Written consent was obtained.

Ultrasound was performed to localize and mark an adequate pocket of
fluid in the right chest. The area was then prepped and draped in
the normal sterile fashion. 1% Lidocaine was used for local
anesthesia. Under ultrasound guidance a 6 Fr Safe-T-Centesis
catheter was introduced. Thoracentesis was performed. The catheter
was removed and a dressing applied.
FINDINGS: A total of approximately 1.6 L of clear amber fluid was removed.
IMPRESSION: Successful ultrasound guided right thoracentesis yielding 1.6 L of
pleural fluid.

No pneumothorax on follow-up radiograph.

## 2019-04-19 DIAGNOSIS — I5032 Chronic diastolic (congestive) heart failure: Secondary | ICD-10-CM | POA: Diagnosis not present

## 2019-04-19 DIAGNOSIS — I509 Heart failure, unspecified: Secondary | ICD-10-CM | POA: Diagnosis not present

## 2019-05-10 DIAGNOSIS — I509 Heart failure, unspecified: Secondary | ICD-10-CM | POA: Diagnosis not present

## 2019-05-10 DIAGNOSIS — I5032 Chronic diastolic (congestive) heart failure: Secondary | ICD-10-CM | POA: Diagnosis not present

## 2019-05-17 DIAGNOSIS — I5032 Chronic diastolic (congestive) heart failure: Secondary | ICD-10-CM | POA: Diagnosis not present

## 2019-05-17 DIAGNOSIS — I509 Heart failure, unspecified: Secondary | ICD-10-CM | POA: Diagnosis not present

## 2019-05-27 IMAGING — DX DG CHEST 2V
2 series · 2 of 2 positions shown · non-contrast
Comparison: 03/15/2018

CLINICAL DATA: Shortness of breath, pleural effusion, smoker

EXAM:
CHEST - 2 VIEW

[dg chest 2 view (1 of 2)]
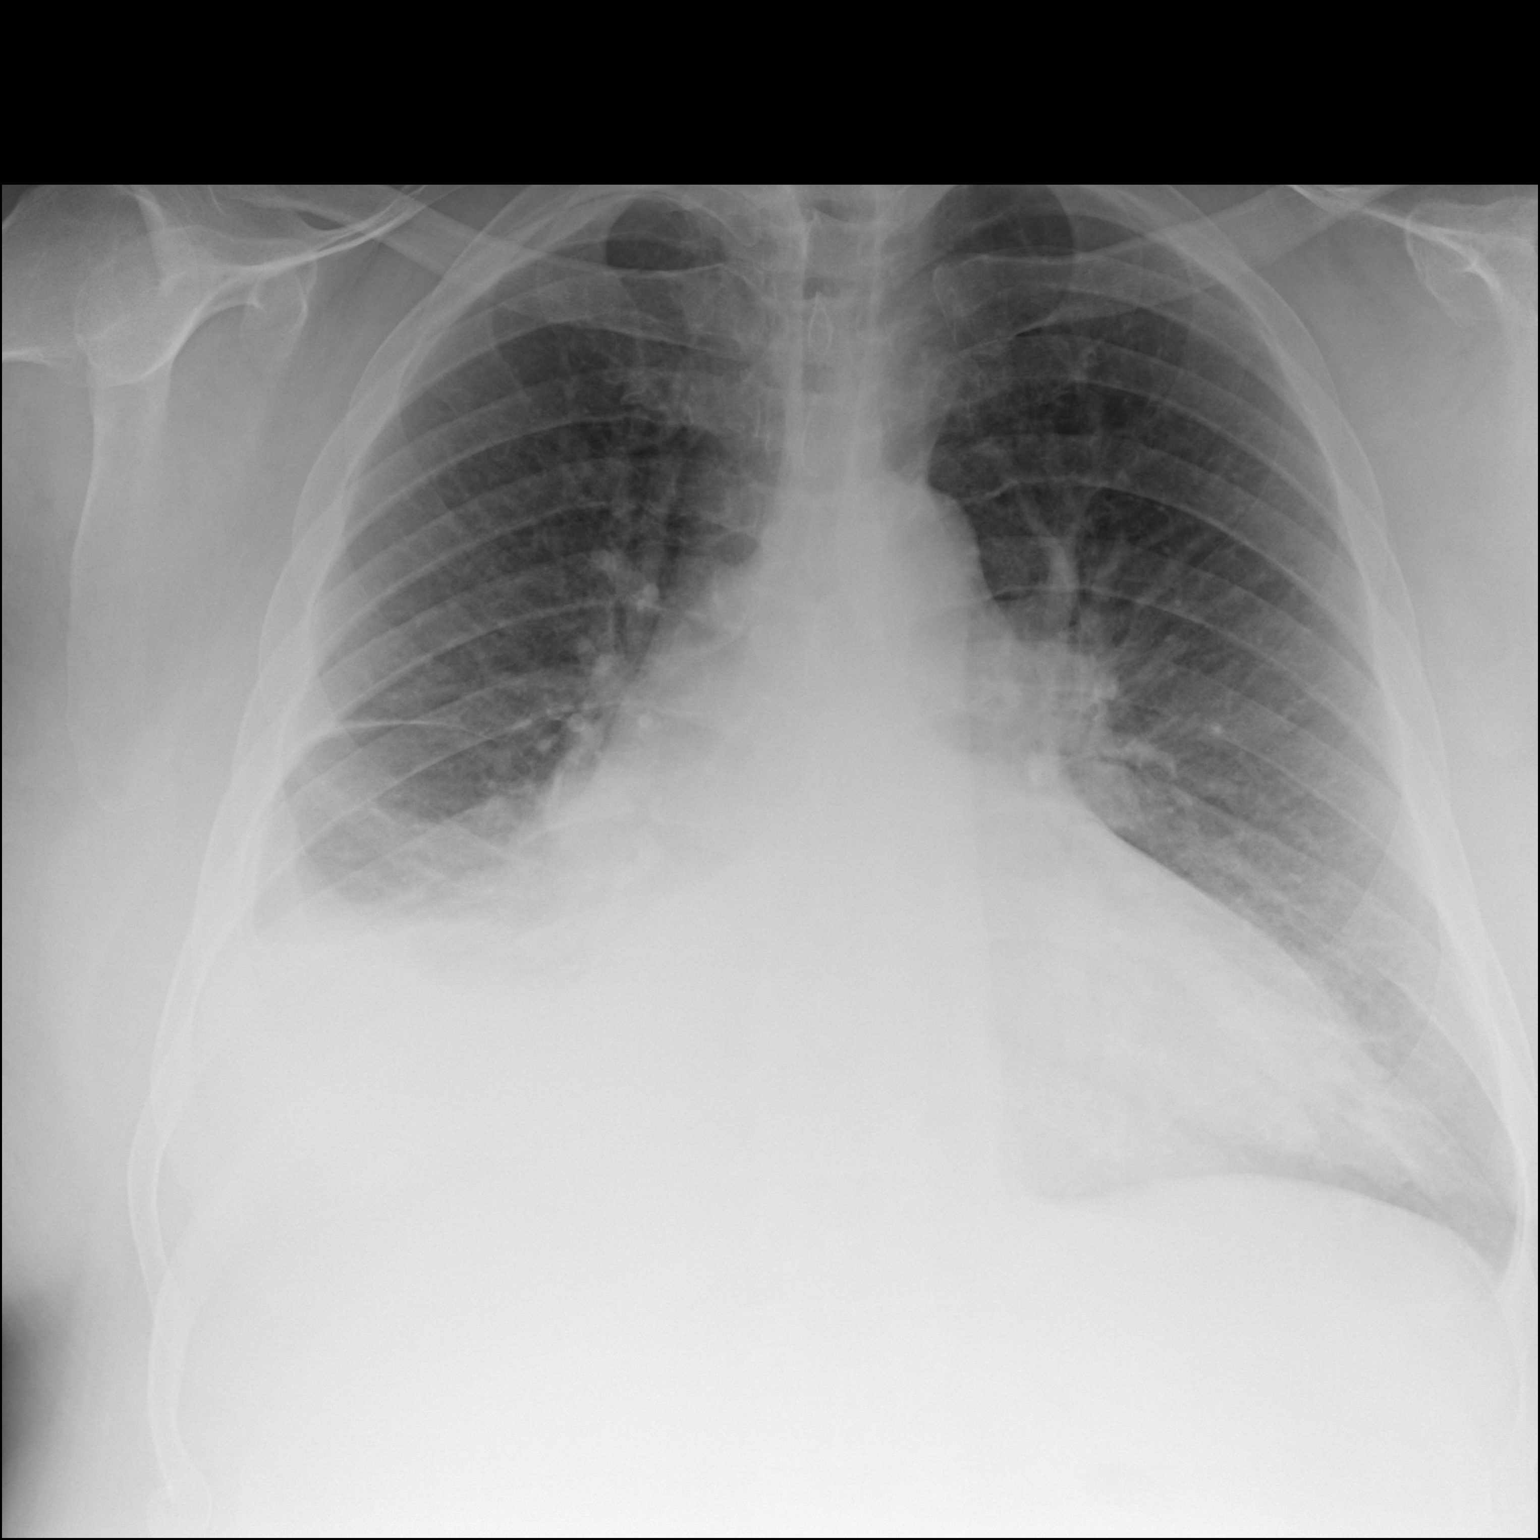

[dg chest 2 view (2 of 2)]
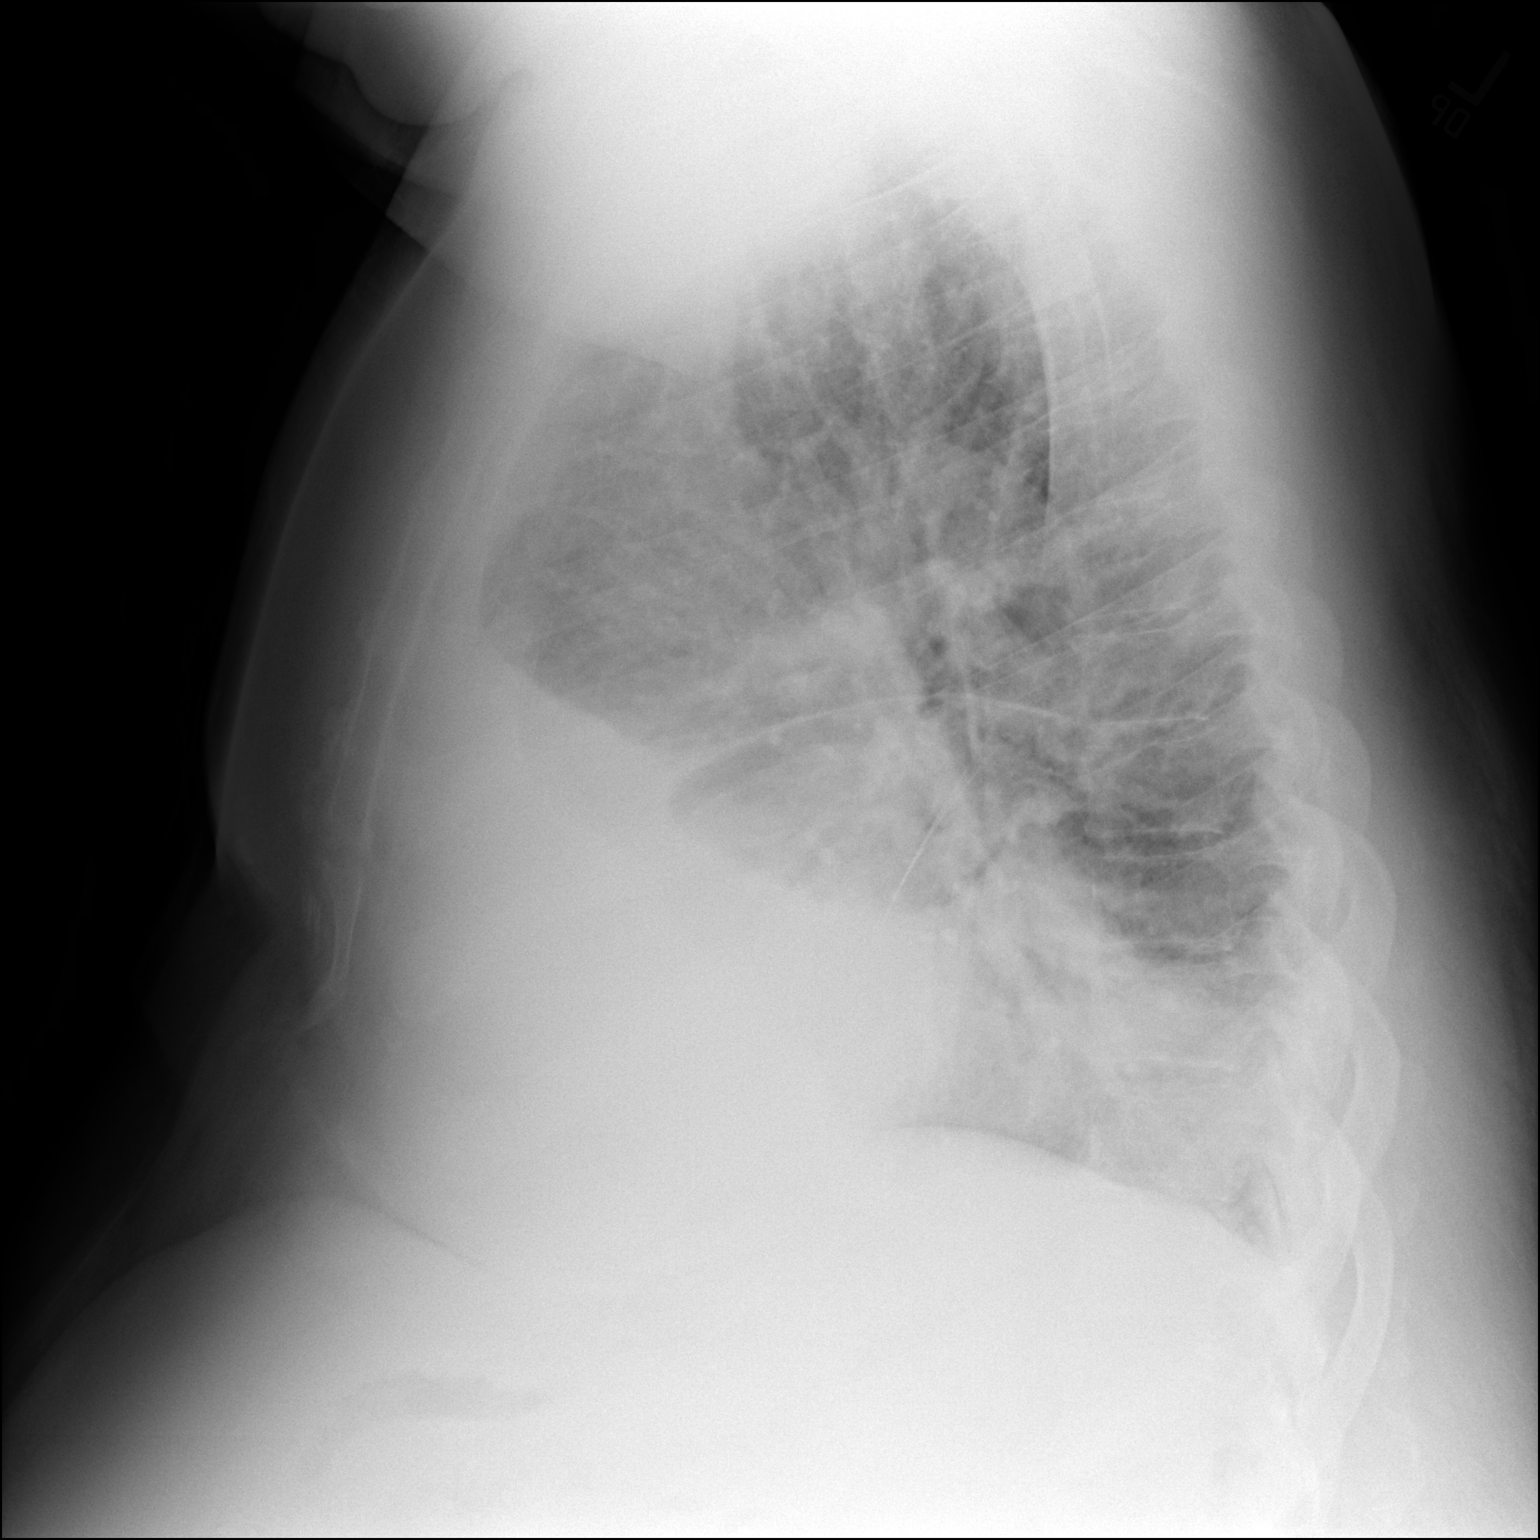

[2 of 2 positions shown; findings below may reference images not displayed]

FINDINGS: Enlargement of the now moderate right pleural effusion with right
middle and lower lobe collapse/consolidation. Heart remains
enlarged. Normal vascularity. No current edema pattern or CHF. No
pneumothorax. Trachea midline.
IMPRESSION: Recurrent moderate right pleural effusion with right lower lung
collapse/consolidation.

Cardiomegaly with mild vascular congestion.

## 2019-06-03 IMAGING — DX DG CHEST 2V
2 series · 2 of 2 positions shown · non-contrast
Comparison: 04/28/2018 chest radiograph.

CLINICAL DATA: Pleural effusion follow up

EXAM:
CHEST - 2 VIEW

[chest pa]
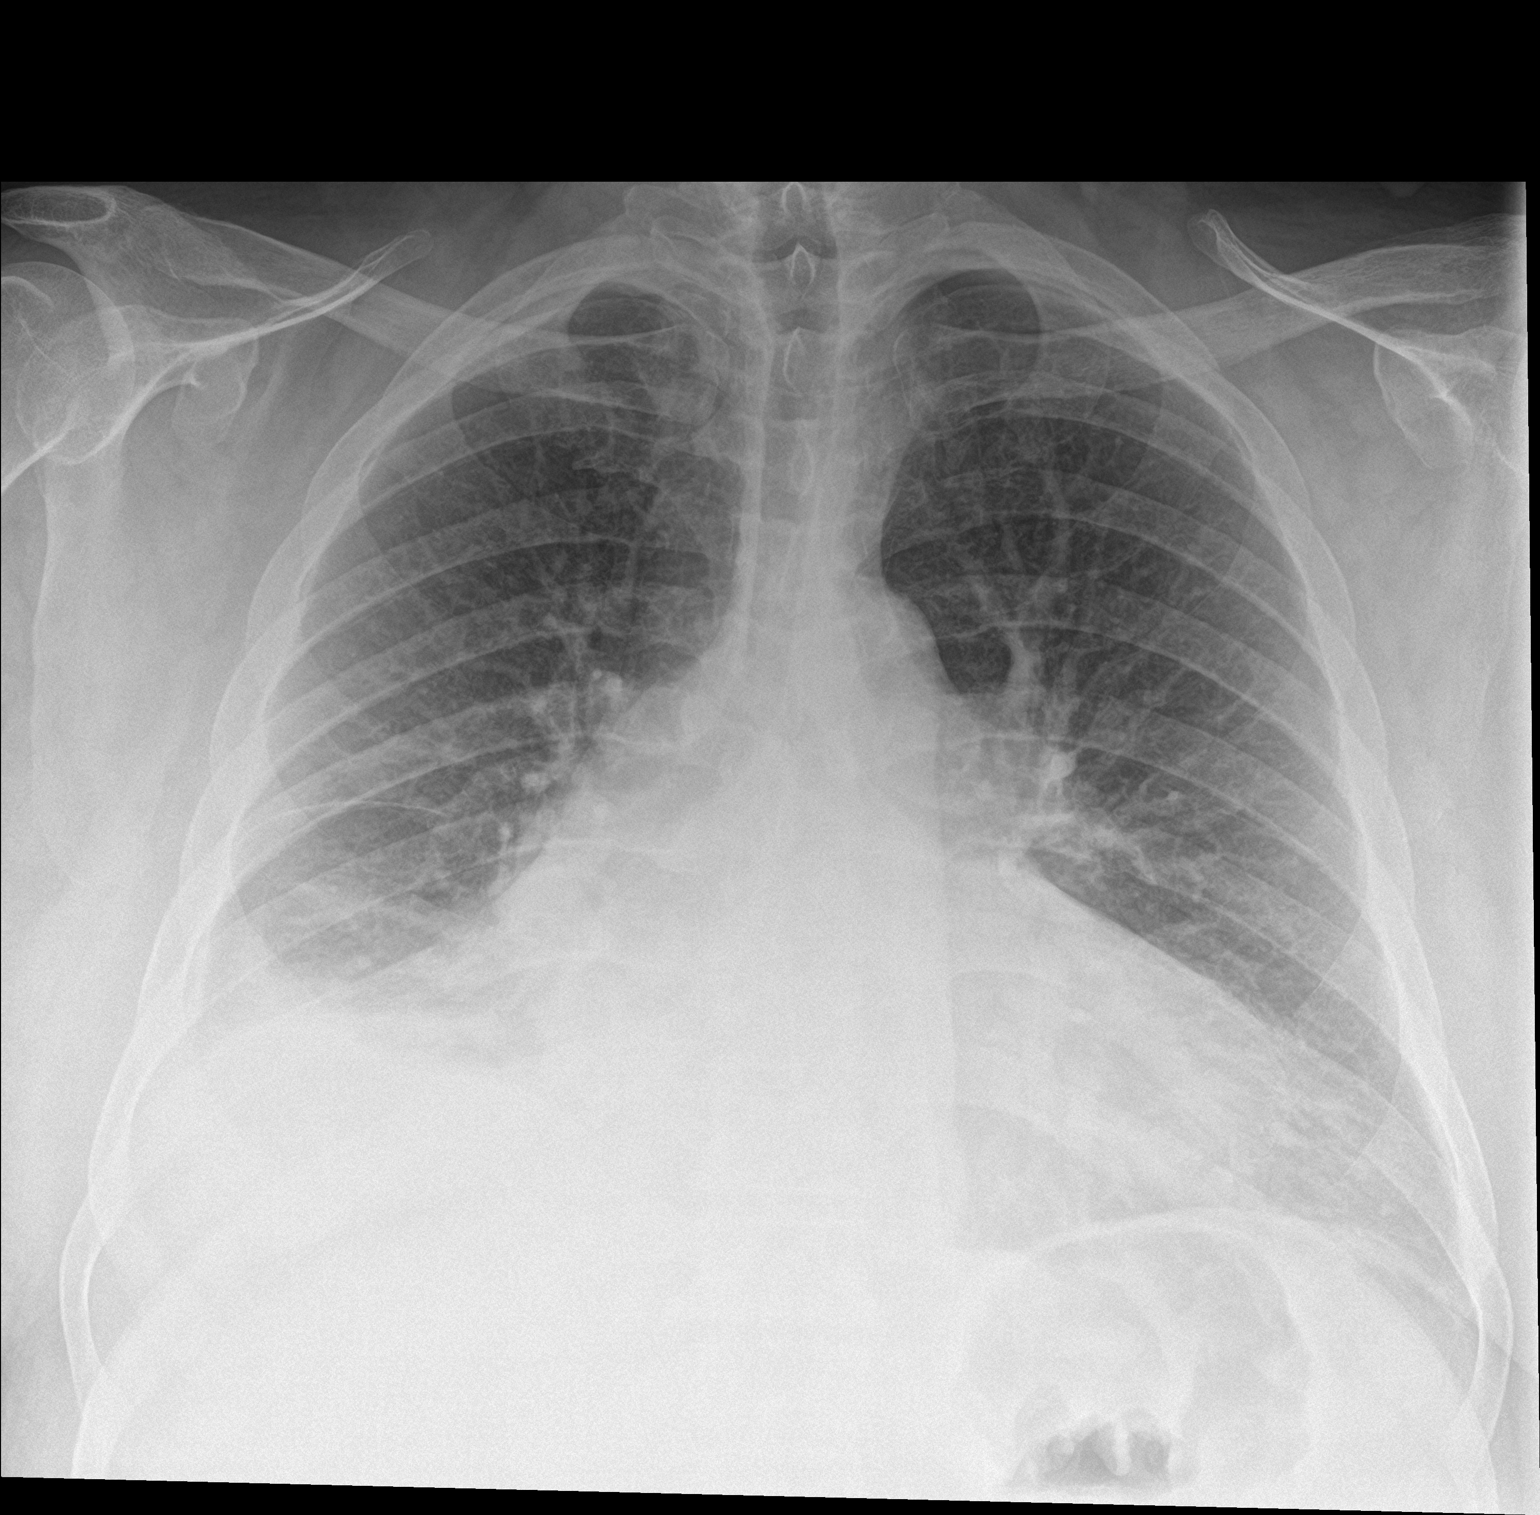

[chest lat]
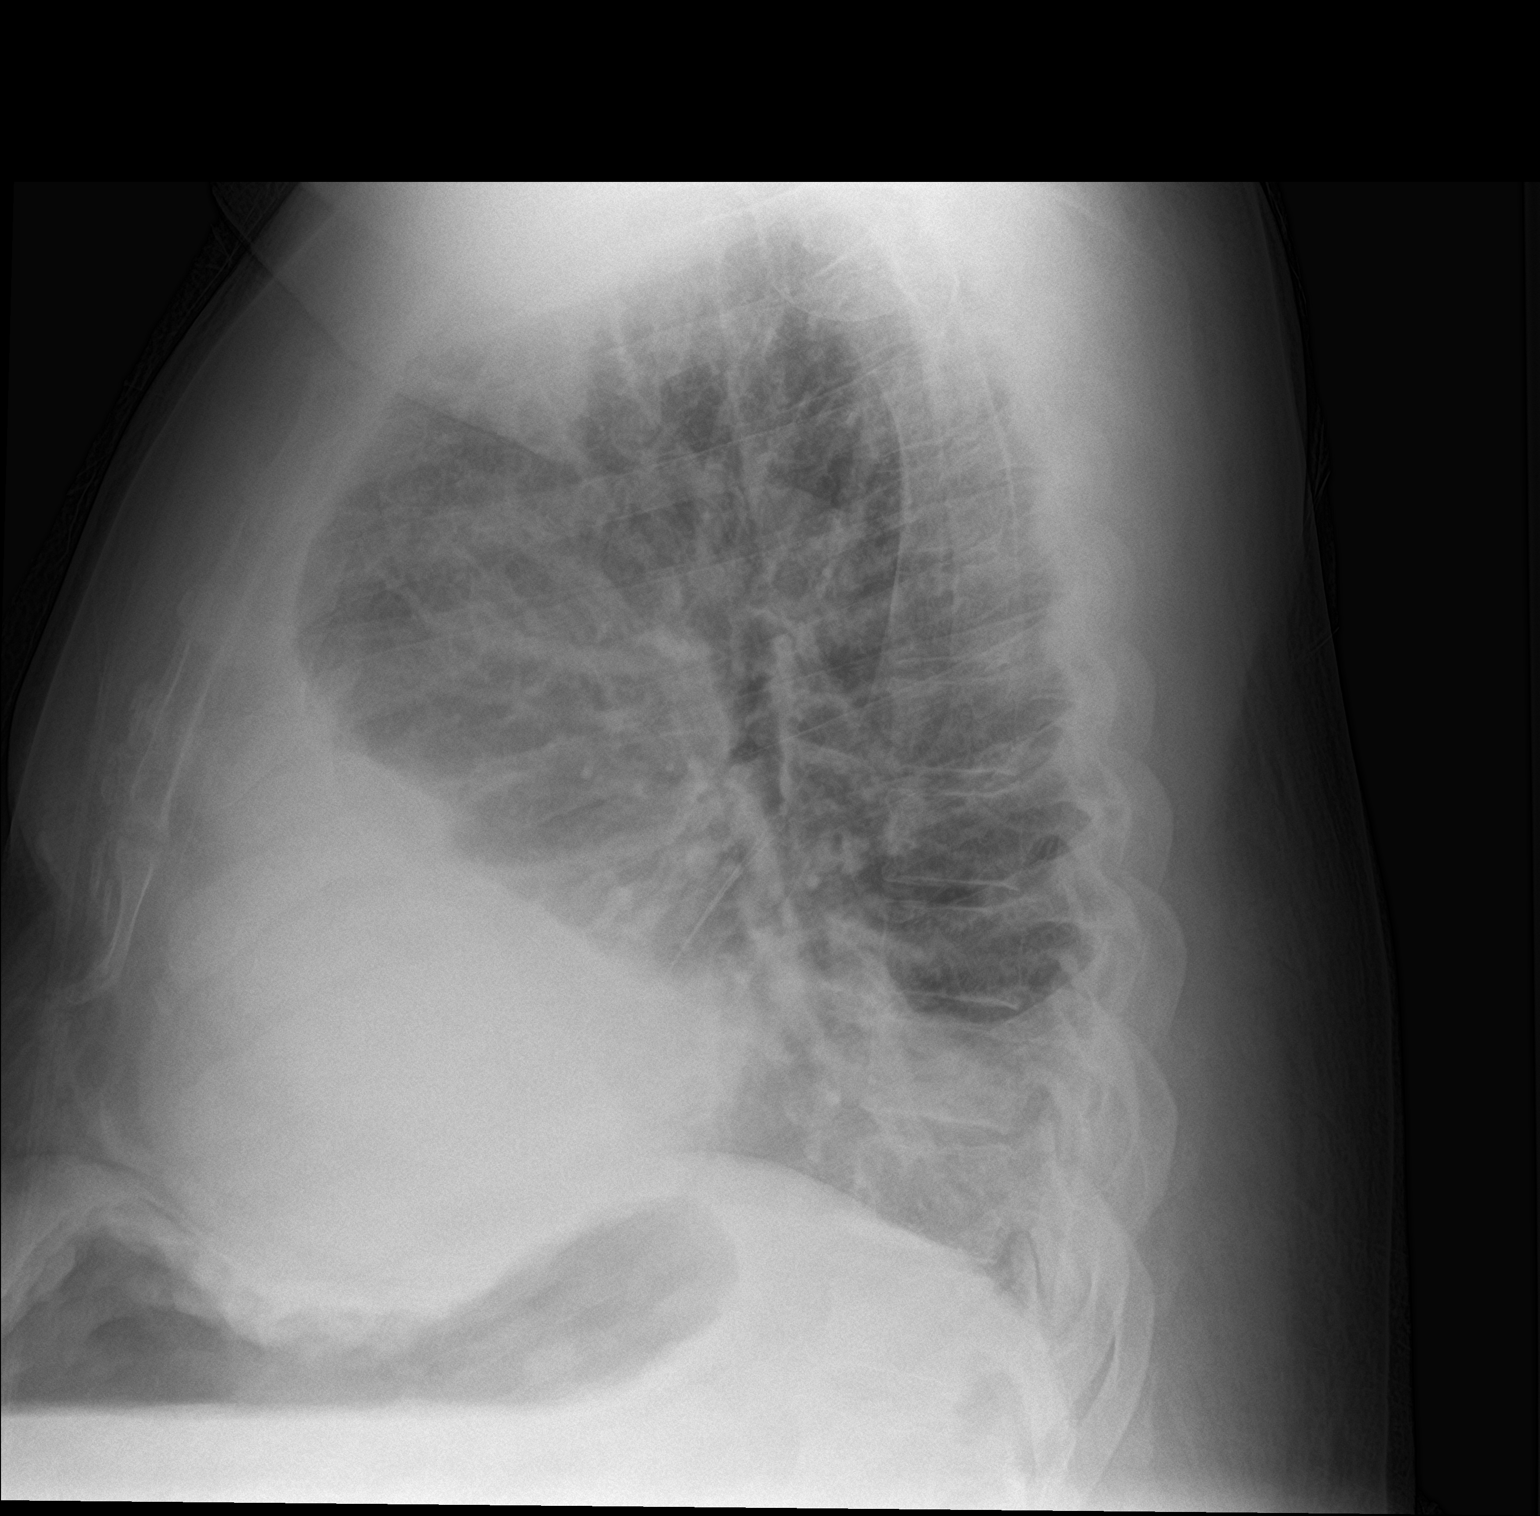

[2 of 2 positions shown; findings below may reference images not displayed]

FINDINGS: Stable cardiomediastinal silhouette with mild cardiomegaly. No
pneumothorax. Small right pleural effusion is stable. No left
pleural effusion. Cephalization of the pulmonary vasculature without
overt pulmonary edema. Stable right basilar atelectasis.
IMPRESSION: 1. Stable small right pleural effusion with right basilar
atelectasis.
2. Stable mild cardiomegaly without overt pulmonary edema.

## 2019-06-07 DIAGNOSIS — I509 Heart failure, unspecified: Secondary | ICD-10-CM | POA: Diagnosis not present

## 2019-06-07 DIAGNOSIS — I5032 Chronic diastolic (congestive) heart failure: Secondary | ICD-10-CM | POA: Diagnosis not present

## 2019-06-17 DIAGNOSIS — I5032 Chronic diastolic (congestive) heart failure: Secondary | ICD-10-CM | POA: Diagnosis not present

## 2019-06-17 DIAGNOSIS — I509 Heart failure, unspecified: Secondary | ICD-10-CM | POA: Diagnosis not present

## 2019-06-21 ENCOUNTER — Other Ambulatory Visit: Payer: Self-pay | Admitting: Internal Medicine

## 2019-06-21 NOTE — Telephone Encounter (Signed)
Seems to be long term med

## 2019-06-21 NOTE — Telephone Encounter (Signed)
Need refill on potassium chloride (KLOR-CON) 10 MEQ tablet(Expired), pt contact [phone]  [facility] [phone] - [place], [place] - [address] AT [place]

## 2019-06-22 ENCOUNTER — Other Ambulatory Visit: Payer: Self-pay | Admitting: Internal Medicine

## 2019-06-22 DIAGNOSIS — R0981 Nasal congestion: Secondary | ICD-10-CM

## 2019-06-22 MED ORDER — FLUTICASONE PROPIONATE 50 MCG/ACT NA SUSP: 1.0000 | Freq: Every day | NASAL | 2 refills | Status: DC

## 2019-06-22 MED ORDER — POTASSIUM CHLORIDE ER 10 MEQ PO TBCR: EXTENDED_RELEASE_TABLET | ORAL | 1 refills | Status: DC

## 2019-06-22 MED ORDER — DILTIAZEM HCL ER COATED BEADS 240 MG PO CP24: 480.0000 mg | ORAL_CAPSULE | Freq: Every day | ORAL | 1 refills | Status: DC

## 2019-06-22 MED ORDER — AZELASTINE HCL 0.1 % NA SOLN: NASAL | 1 refills | Status: DC

## 2019-06-22 NOTE — Telephone Encounter (Signed)
Pt needs someone to call back regarding medicine, pharmacy is only giving him 14 pills he said he takes 6 pills a day; [phone]

## 2019-06-22 NOTE — Telephone Encounter (Signed)
Called pt - stated he's requesting 90 day supplies. Potassium 180 tabs only last 15 days; he takes 6 tabs twice a day. Thanks

## 2019-07-02 ENCOUNTER — Emergency Department (HOSPITAL_COMMUNITY): Payer: Medicaid Other

## 2019-07-02 ENCOUNTER — Inpatient Hospital Stay (HOSPITAL_COMMUNITY)
Admission: EM | Admit: 2019-07-02 | Discharge: 2019-07-09 | DRG: 291 | Disposition: A | Discharge status: Home / Self Care | Payer: Medicaid Other | Attending: Internal Medicine | Admitting: Internal Medicine

## 2019-07-02 ENCOUNTER — Encounter (HOSPITAL_COMMUNITY): Payer: Self-pay

## 2019-07-02 ENCOUNTER — Other Ambulatory Visit: Payer: Self-pay

## 2019-07-02 DIAGNOSIS — I4821 Permanent atrial fibrillation: Secondary | ICD-10-CM | POA: Diagnosis present

## 2019-07-02 DIAGNOSIS — Z7901 Long term (current) use of anticoagulants: Secondary | ICD-10-CM

## 2019-07-02 DIAGNOSIS — R350 Frequency of micturition: Secondary | ICD-10-CM | POA: Diagnosis not present

## 2019-07-02 DIAGNOSIS — B182 Chronic viral hepatitis C: Secondary | ICD-10-CM | POA: Diagnosis present

## 2019-07-02 DIAGNOSIS — Z8249 Family history of ischemic heart disease and other diseases of the circulatory system: Secondary | ICD-10-CM

## 2019-07-02 DIAGNOSIS — R0902 Hypoxemia: Secondary | ICD-10-CM | POA: Diagnosis not present

## 2019-07-02 DIAGNOSIS — Z9889 Other specified postprocedural states: Secondary | ICD-10-CM

## 2019-07-02 DIAGNOSIS — J9621 Acute and chronic respiratory failure with hypoxia: Secondary | ICD-10-CM | POA: Diagnosis present

## 2019-07-02 DIAGNOSIS — Z79891 Long term (current) use of opiate analgesic: Secondary | ICD-10-CM

## 2019-07-02 DIAGNOSIS — J9601 Acute respiratory failure with hypoxia: Secondary | ICD-10-CM | POA: Diagnosis not present

## 2019-07-02 DIAGNOSIS — I509 Heart failure, unspecified: Secondary | ICD-10-CM | POA: Diagnosis not present

## 2019-07-02 DIAGNOSIS — I5043 Acute on chronic combined systolic (congestive) and diastolic (congestive) heart failure: Secondary | ICD-10-CM

## 2019-07-02 DIAGNOSIS — J918 Pleural effusion in other conditions classified elsewhere: Secondary | ICD-10-CM | POA: Diagnosis present

## 2019-07-02 DIAGNOSIS — Z79899 Other long term (current) drug therapy: Secondary | ICD-10-CM

## 2019-07-02 DIAGNOSIS — E662 Morbid (severe) obesity with alveolar hypoventilation: Secondary | ICD-10-CM | POA: Diagnosis present

## 2019-07-02 DIAGNOSIS — E873 Alkalosis: Secondary | ICD-10-CM | POA: Diagnosis not present

## 2019-07-02 DIAGNOSIS — F17210 Nicotine dependence, cigarettes, uncomplicated: Secondary | ICD-10-CM | POA: Diagnosis present

## 2019-07-02 DIAGNOSIS — I110 Hypertensive heart disease with heart failure: Principal | ICD-10-CM | POA: Diagnosis present

## 2019-07-02 DIAGNOSIS — Z6841 Body Mass Index (BMI) 40.0 and over, adult: Secondary | ICD-10-CM

## 2019-07-02 DIAGNOSIS — Z20822 Contact with and (suspected) exposure to covid-19: Secondary | ICD-10-CM | POA: Diagnosis not present

## 2019-07-02 DIAGNOSIS — G4733 Obstructive sleep apnea (adult) (pediatric): Secondary | ICD-10-CM | POA: Diagnosis present

## 2019-07-02 DIAGNOSIS — Z5189 Encounter for other specified aftercare: Secondary | ICD-10-CM

## 2019-07-02 DIAGNOSIS — Z9981 Dependence on supplemental oxygen: Secondary | ICD-10-CM

## 2019-07-02 DIAGNOSIS — E739 Lactose intolerance, unspecified: Secondary | ICD-10-CM | POA: Diagnosis present

## 2019-07-02 DIAGNOSIS — R000 Tachycardia, unspecified: Secondary | ICD-10-CM | POA: Diagnosis not present

## 2019-07-02 DIAGNOSIS — I4892 Unspecified atrial flutter: Secondary | ICD-10-CM | POA: Diagnosis present

## 2019-07-02 DIAGNOSIS — J80 Acute respiratory distress syndrome: Secondary | ICD-10-CM | POA: Diagnosis not present

## 2019-07-02 DIAGNOSIS — F17200 Nicotine dependence, unspecified, uncomplicated: Secondary | ICD-10-CM | POA: Diagnosis present

## 2019-07-02 DIAGNOSIS — R140 Abdominal distension (gaseous): Secondary | ICD-10-CM | POA: Diagnosis not present

## 2019-07-02 DIAGNOSIS — R0602 Shortness of breath: Secondary | ICD-10-CM | POA: Diagnosis not present

## 2019-07-02 LAB — CBC WITH DIFFERENTIAL/PLATELET
Abs Immature Granulocytes: 0.02 10*3/uL (ref 0.00–0.07)
Basophils Absolute: 0 10*3/uL (ref 0.0–0.1)
Basophils Relative: 1 %
Eosinophils Absolute: 0.1 10*3/uL (ref 0.0–0.5)
Eosinophils Relative: 1 %
HCT: 40.6 % (ref 39.0–52.0)
Hemoglobin: 12.6 g/dL — ABNORMAL LOW (ref 13.0–17.0)
Immature Granulocytes: 0 %
Lymphocytes Relative: 27 %
Lymphs Abs: 1.7 10*3/uL (ref 0.7–4.0)
MCH: 31 pg (ref 26.0–34.0)
MCHC: 31 g/dL (ref 30.0–36.0)
MCV: 99.8 fL (ref 80.0–100.0)
Monocytes Absolute: 0.8 10*3/uL (ref 0.1–1.0)
Monocytes Relative: 13 %
Neutro Abs: 3.7 10*3/uL (ref 1.7–7.7)
Neutrophils Relative %: 58 %
Platelets: 160 10*3/uL (ref 150–400)
RBC: 4.07 MIL/uL — ABNORMAL LOW (ref 4.22–5.81)
RDW: 14.1 % (ref 11.5–15.5)
WBC: 6.4 10*3/uL (ref 4.0–10.5)
nRBC: 0 % (ref 0.0–0.2)

## 2019-07-02 LAB — BASIC METABOLIC PANEL
Anion gap: 7 (ref 5–15)
BUN: 16 mg/dL (ref 6–20)
CO2: 37 mmol/L — ABNORMAL HIGH (ref 22–32)
Calcium: 8.8 mg/dL — ABNORMAL LOW (ref 8.9–10.3)
Chloride: 92 mmol/L — ABNORMAL LOW (ref 98–111)
Creatinine, Ser: 0.63 mg/dL (ref 0.61–1.24)
GFR calc Af Amer: >60 mL/min (ref >60)
GFR calc non Af Amer: >60 mL/min (ref >60)
Glucose, Bld: 103 mg/dL — ABNORMAL HIGH (ref 70–99)
Potassium: 4.3 mmol/L (ref 3.5–5.1)
Sodium: 136 mmol/L (ref 135–145)

## 2019-07-02 MED ORDER — FUROSEMIDE 10 MG/ML IJ SOLN
80.0000 mg | Freq: Once | INTRAMUSCULAR | Status: AC
  Administered 2019-07-03: 80 mg via INTRAVENOUS
  Filled 2019-07-02: qty 8

## 2019-07-02 MED ORDER — METHYLPREDNISOLONE SODIUM SUCC 125 MG IJ SOLR
125.0000 mg | Freq: Once | INTRAMUSCULAR | Status: AC
  Administered 2019-07-02: 23:00:00 125 mg via INTRAVENOUS
  Filled 2019-07-02: qty 2

## 2019-07-02 NOTE — ED Provider Notes (Signed)
[facility] EMERGENCY DEPARTMENT Provider Note   CSN: [id number] Arrival date & time: 07/02/19  2217     History Chief Complaint  Patient presents with  . Shortness of Breath    Frank Morales is a 59 y.o. male with history of atrial fibrillation, CHF, hypertension, OSA, obesity, tobacco abuse, hepatitis C presenting for evaluation of acute onset, progressively worsening shortness of breath for 1 week.  He reports bilateral lower extremity edema, feeling as though his abdomen is edematous, 6 pound weight gain over the last week.  He notes some chest tightness but denies chest pain or fevers.  He has a chronic cough but feels as though his cough now is worse than baseline, notes sinus pressure and congestion.  Endorses orthopnea, PND, dyspnea on exertion.  Denies nausea, vomiting, diarrhea, or urinary symptoms.  He took an extra dose of his Lasix p.o. today.  He is on 3-1/2 L supplemental oxygen at baseline per his report.  No known sick contacts or Covid exposures.  EMS noted him to be hypoxic to 75% on room air.  They gave him a breathing treatment consisting of albuterol which he reports was helpful.  He is a current smoker of 5 or 6 cigarettes daily.  Denies recreational drug use.  The history is provided by the patient.       Past Medical History:  Diagnosis Date  . Atrial fibrillation and flutter ([place]) 08/17/2015   A. S/p failed DCCV // b. Severe BAE on echo >> rate control strategy (has seen AF clinic) // c. Xarelto for anticoag (CHADS2-VASc=2 / CHF, HTN)  . Chronic diastolic CHF (congestive heart failure) ([place]) 08/30/2015   A. Echo 6/17: Apical HK, moderate focal basal and mild concentric LVH, EF 50-55, diffuse HK, trivial MR, severe BAE, mild TR, PASP 37  . Dependence on continuous supplemental oxygen    3L  . Hepatitis C, chronic ([place]) 08/19/2015  . History of cardiac catheterization    a. LHC 6/17: LAD irregs, o/w no CAD  . Hypertension   . OSA (obstructive  sleep apnea) 11/21/2015   No Cpap  . Sleep apnea   . Tobacco abuse     Patient Active Problem List   Diagnosis Date Noted  . Acute respiratory failure with hypoxia ([place]) 07/03/2019  . Pleural effusion on right 03/14/2018  . Hyposmia 12/22/2017  . Tobacco abuse counseling   . Permanent atrial fibrillation   . Sinus congestion 03/03/2017  . Chronic dental pain 08/19/2016  . Grade I internal hemorrhoids   . 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 Contact dermatitis due to poison ivy 02/26/2016  . OSA (obstructive sleep apnea) 11/21/2015  . Chronic diastolic CHF (congestive heart failure) ([place]) 08/30/2015  . Hepatitis C, chronic ([place]) 08/19/2015  . Morbid obesity ([place]) 08/17/2015  . Hypertension 08/17/2015  . Orthopnea 08/17/2015  . Bilateral leg edema 08/17/2015  . Current smoker 08/17/2015    Past Surgical History:  Procedure Laterality Date  . CARDIAC CATHETERIZATION N/A 08/21/2015   Procedure: Right/Left Heart Cath and Coronary Angiography;  Surgeon: Leonie Man, MD;  Location: [facility];  Service: Cardiovascular;  Laterality: N/A;  . CARDIOVERSION N/A 09/22/2015   Procedure: CARDIOVERSION;  Surgeon: Josue Hector, MD;  Location: [facility] ENDOSCOPY;  Service: Cardiovascular;  Laterality: N/A;  . COLONOSCOPY N/A 07/19/2016   Procedure: COLONOSCOPY;  Surgeon: Doran Stabler, MD;  Location: [facility] ENDOSCOPY;  Service: Gastroenterology;  Laterality: N/A;  . ESOPHAGOGASTRODUODENOSCOPY N/A 07/19/2016  Procedure: ESOPHAGOGASTRODUODENOSCOPY (EGD);  Surgeon: Doran Stabler, MD;  Location: [facility];  Service: Gastroenterology;  Laterality: N/A;  . NO PAST SURGERIES    . SINUS ENDO WITH FUSION N/A 10/02/2018   Procedure: SINUS ENDO WITH FUSION;  Surgeon: Melissa Montane, MD;  Location: [facility] OR;  Service: ENT;  Laterality: N/A;       Family History  Problem Relation Age of Onset  . Hypertension Mother   . Diabetes Mother   . Pneumonia Father   . Hypertension Maternal Grandfather   .  Colon cancer Neg Hx     Social History   Tobacco Use  . Smoking status: Current Every Day Smoker    Packs/day: 0.20    Types: Cigarettes  . Smokeless tobacco: Never Used  . Tobacco comment: 1 pack per week  Substance Use Topics  . Alcohol use: Yes    Alcohol/week: 5.0 standard drinks    Types: 5 Standard drinks or equivalent per week    Comment: 2 times a week beer  . Drug use: No    Home Medications Prior to Admission medications   Medication Sig Start Date End Date Taking? Authorizing Provider  azelastine (ASTELIN) 0.1 % nasal spray INSTILL 2 SPRAYS INTO BOTH NOSTRILS TWICE DAILY 06/22/19   Sid Falcon, MD  cetirizine (ZYRTEC) 10 MG tablet Take 1 tablet (10 mg total) by mouth daily. 07/29/18   [name], MD  diltiazem (CARDIZEM CD) 240 MG 24 hr capsule Take 2 capsules (480 mg total) by mouth daily. 06/22/19   Sid Falcon, MD  fluticasone (FLONASE) 50 MCG/ACT nasal spray Place 1 spray into both nostrils daily. 06/22/19   Sid Falcon, MD  furosemide (LASIX) 80 MG tablet TAKE 1 TABLET BY MOUTH TWICE DAILY 02/18/19   [name], MD  HYDROcodone-acetaminophen (NORCO/VICODIN) 5-325 MG tablet Take 1-2 tablets by mouth every 6 (six) hours as needed for severe pain. 02/12/19   Law, Bea Graff, PA-C  levofloxacin (LEVAQUIN) 500 MG tablet Take 1 tablet (500 mg total) by mouth daily. 02/12/19   Law, Bea Graff, PA-C  loratadine (CLARITIN) 10 MG tablet TAKE 1 TABLET(10 MG) BY MOUTH DAILY 11/16/18   Fay Records, MD  potassium chloride (KLOR-CON) 10 MEQ tablet TAKE 6 TABLETS(60 MEQ) BY MOUTH TWICE DAILY 06/22/19   Sid Falcon, MD  XARELTO 20 MG TABS tablet TAKE 1 TABLET(20 MG) BY MOUTH DAILY WITH SUPPER 02/18/19   [name], MD    Allergies    Lactose intolerance (gi)  Review of Systems   Review of Systems  Constitutional: Negative for chills and fever.  HENT: Positive for congestion and sinus pressure.   Respiratory: Positive for cough, chest tightness  and shortness of breath.   Cardiovascular: Positive for leg swelling. Negative for chest pain.  Gastrointestinal: Positive for abdominal distention. Negative for abdominal pain, nausea and vomiting.  All other systems reviewed and are negative.   Physical Exam Updated Vital Signs BP 124/89   Pulse 95   Temp 98.5 F (36.9 C) (Oral)   Resp 18   Ht 5\' 11"  (1.803 m)   Wt (!) 157.9 kg   SpO2 96%   BMI 48.54 kg/m   Physical Exam Vitals and nursing note reviewed.  Constitutional:      General: He is not in acute distress.    Appearance: He is well-developed. He is obese.  HENT:     Head: Normocephalic and atraumatic.  Eyes:  General:        Right eye: No discharge.        Left eye: No discharge.     Conjunctiva/sclera: Conjunctivae normal.  Neck:     Vascular: No JVD.     Trachea: No tracheal deviation.  Cardiovascular:     Rate and Rhythm: Tachycardia present. Rhythm irregular.     Comments: Mildly tachycardic, rate irregularly irregular.  3+ pitting edema of the bilateral lower extremities.  Bevelyn Buckles' sign absent bilaterally. Pulmonary:     Effort: Tachypnea present.     Breath sounds: Examination of the right-lower field reveals rales. Examination of the left-lower field reveals rales. Rales present.     Comments: Speaking in short phrases, scattered expiratory wheezes.  Bibasilar Rales noted, right worse than left.  SPO2 saturations down to 88% on 3.5 L via nasal cannula, improved to 95% on 6 L Abdominal:     General: Abdomen is protuberant. Bowel sounds are normal. There is no distension.     Tenderness: There is no abdominal tenderness.  Musculoskeletal:     Right lower leg: Edema present.     Left lower leg: Edema present.  Skin:    General: Skin is warm and dry.     Findings: No erythema.  Neurological:     Mental Status: He is alert.  Psychiatric:        Behavior: Behavior normal.     ED Results / Procedures / Treatments   Labs (all labs ordered are  listed, but only abnormal results are displayed) Labs Reviewed  BASIC METABOLIC PANEL - Abnormal; Notable for the following components:      Result Value   Chloride 92 (*)    CO2 37 (*)    Glucose, Bld 103 (*)    Calcium 8.8 (*)    All other components within normal limits  CBC WITH DIFFERENTIAL/PLATELET - Abnormal; Notable for the following components:   RBC 4.07 (*)    Hemoglobin 12.6 (*)    All other components within normal limits  SARS CORONAVIRUS 2 (TAT 6-24 HRS)  BRAIN NATRIURETIC PEPTIDE  HIV ANTIBODY (ROUTINE TESTING W REFLEX)  BASIC METABOLIC PANEL  CBC  BLOOD GAS, ARTERIAL    EKG None  Radiology DG Chest Port 1 View  Result Date: 07/02/2019 CLINICAL DATA:  Short of breath for 1 week, hypoxia EXAM: PORTABLE CHEST 1 VIEW COMPARISON:  05/05/2018 FINDINGS: Single frontal view of the chest demonstrates persistent enlargement the cardiac silhouette. There is worsening volume status, with increased central vascular congestion and progressive bibasilar veiling opacities. No pneumothorax. IMPRESSION: 1. Moderate congestive heart failure. Electronically Signed   By: Randa Ngo M.D.   On: 07/02/2019 23:11    Procedures .Critical Care Performed by: Renita Papa, PA-C Authorized by: Renita Papa, PA-C   Critical care provider statement:    Critical care time (minutes):  45   Critical care was necessary to treat or prevent imminent or life-threatening deterioration of the following conditions:  Respiratory failure   Critical care was time spent personally by me on the following activities:  Discussions with consultants, evaluation of patient's response to treatment, examination of patient, ordering and performing treatments and interventions, ordering and review of laboratory studies, ordering and review of radiographic studies, pulse oximetry, re-evaluation of patient's condition, obtaining history from patient or surrogate and review of old charts   (including critical  care time)  Medications Ordered in ED Medications  rivaroxaban (XARELTO) tablet 20 mg (has no administration in  time range)  fluticasone (FLONASE) 50 MCG/ACT nasal spray 1 spray (has no administration in time range)  furosemide (LASIX) injection 80 mg (has no administration in time range)  diltiazem (CARDIZEM CD) 24 hr capsule 240 mg (has no administration in time range)  methylPREDNISolone sodium succinate (SOLU-MEDROL) 125 mg/2 mL injection 125 mg (125 mg Intravenous Given 07/02/19 2320)  furosemide (LASIX) injection 80 mg (80 mg Intravenous Given 07/03/19 0003)    ED Course  I have reviewed the triage vital signs and the nursing notes.  Pertinent labs & imaging results that were available during my care of the patient were reviewed by me and considered in my medical decision making (see chart for details).    MDM Rules/Calculators/A&P                      Patient presenting for evaluation of progressively worsening bilateral lower extremity edema, dyspnea on exertion, orthopnea, PND, shortness of breath at rest for 1 week.  He is afebrile, hypoxic on his usual home oxygen requirement to 88%, improved on 6 L/min via nasal cannula.  He is only able to sit upright due to his symptoms, cannot lay flat.  Clinically appears volume overloaded and has had 6 pound weight gain in the last week.  Bibasilar crackles and scattered wheezes noted on auscultation of the lungs, question possible superimposed COPD exacerbation as he had relief in his symptoms to some degree with breathing treatment that was administered by EMS.  Chest x-ray consistent with moderate CHF.  He was given IV Lasix in the ED.  Lab work reviewed and interpreted by myself show no leukocytosis, mild anemia, elevated CO2 compared to baseline.  He is BNP is within normal limits today though I suspect this is falsely low in the setting of his morbid obesity.  EKG shows atrial fibrillation.  He is anticoagulated on Xarelto and reports  compliance with this medication.  With increased oxygen requirement and the need for IV Lasix in the ED, will admit for evaluation of CHF exacerbation.  Internal medicine teaching service to admit. Final Clinical Impression(s) / ED Diagnoses Final diagnoses:  Acute on chronic combined systolic and diastolic congestive heart failure ([place])  Acute respiratory failure with hypoxia [facility])    Rx / DC Orders ED Discharge Orders    None       Debroah Baller 07/03/19 [id number]    Wyvonnia Dusky, MD 07/03/19 1316

## 2019-07-02 NOTE — ED Triage Notes (Signed)
Pt BIB [facility] from home c/o of SHOB x1 week that has gradually gotten worse. Pt currently is on 3.5L [place] O2 at home. EMS reported pt was 75% on RA on arrival. EMS gave pt a neb treatment and CPAP and placed pt on 15L NRB. Pt currently sating 96% on 3.5L [place]. Pt denies any pain, N/V.

## 2019-07-03 ENCOUNTER — Inpatient Hospital Stay (HOSPITAL_COMMUNITY): Payer: Medicaid Other

## 2019-07-03 DIAGNOSIS — G4733 Obstructive sleep apnea (adult) (pediatric): Secondary | ICD-10-CM

## 2019-07-03 DIAGNOSIS — Z8619 Personal history of other infectious and parasitic diseases: Secondary | ICD-10-CM

## 2019-07-03 DIAGNOSIS — Z6841 Body Mass Index (BMI) 40.0 and over, adult: Secondary | ICD-10-CM | POA: Diagnosis not present

## 2019-07-03 DIAGNOSIS — Z79899 Other long term (current) drug therapy: Secondary | ICD-10-CM

## 2019-07-03 DIAGNOSIS — J9811 Atelectasis: Secondary | ICD-10-CM | POA: Diagnosis not present

## 2019-07-03 DIAGNOSIS — J329 Chronic sinusitis, unspecified: Secondary | ICD-10-CM | POA: Diagnosis not present

## 2019-07-03 DIAGNOSIS — Z7901 Long term (current) use of anticoagulants: Secondary | ICD-10-CM

## 2019-07-03 DIAGNOSIS — I5033 Acute on chronic diastolic (congestive) heart failure: Secondary | ICD-10-CM

## 2019-07-03 DIAGNOSIS — F17210 Nicotine dependence, cigarettes, uncomplicated: Secondary | ICD-10-CM | POA: Diagnosis present

## 2019-07-03 DIAGNOSIS — I110 Hypertensive heart disease with heart failure: Secondary | ICD-10-CM | POA: Diagnosis not present

## 2019-07-03 DIAGNOSIS — J9621 Acute and chronic respiratory failure with hypoxia: Secondary | ICD-10-CM | POA: Diagnosis present

## 2019-07-03 DIAGNOSIS — I509 Heart failure, unspecified: Secondary | ICD-10-CM | POA: Diagnosis not present

## 2019-07-03 DIAGNOSIS — Z79891 Long term (current) use of opiate analgesic: Secondary | ICD-10-CM | POA: Diagnosis not present

## 2019-07-03 DIAGNOSIS — I5043 Acute on chronic combined systolic (congestive) and diastolic (congestive) heart failure: Secondary | ICD-10-CM | POA: Diagnosis not present

## 2019-07-03 DIAGNOSIS — J9611 Chronic respiratory failure with hypoxia: Secondary | ICD-10-CM | POA: Diagnosis not present

## 2019-07-03 DIAGNOSIS — Z720 Tobacco use: Secondary | ICD-10-CM | POA: Diagnosis not present

## 2019-07-03 DIAGNOSIS — J918 Pleural effusion in other conditions classified elsewhere: Secondary | ICD-10-CM | POA: Diagnosis present

## 2019-07-03 DIAGNOSIS — E739 Lactose intolerance, unspecified: Secondary | ICD-10-CM | POA: Diagnosis present

## 2019-07-03 DIAGNOSIS — E669 Obesity, unspecified: Secondary | ICD-10-CM | POA: Diagnosis not present

## 2019-07-03 DIAGNOSIS — Z9981 Dependence on supplemental oxygen: Secondary | ICD-10-CM | POA: Diagnosis not present

## 2019-07-03 DIAGNOSIS — R0602 Shortness of breath: Secondary | ICD-10-CM | POA: Diagnosis not present

## 2019-07-03 DIAGNOSIS — J9601 Acute respiratory failure with hypoxia: Secondary | ICD-10-CM | POA: Diagnosis present

## 2019-07-03 DIAGNOSIS — Z8249 Family history of ischemic heart disease and other diseases of the circulatory system: Secondary | ICD-10-CM | POA: Diagnosis not present

## 2019-07-03 DIAGNOSIS — J90 Pleural effusion, not elsewhere classified: Secondary | ICD-10-CM | POA: Diagnosis not present

## 2019-07-03 DIAGNOSIS — E662 Morbid (severe) obesity with alveolar hypoventilation: Secondary | ICD-10-CM | POA: Diagnosis present

## 2019-07-03 DIAGNOSIS — I4892 Unspecified atrial flutter: Secondary | ICD-10-CM | POA: Diagnosis present

## 2019-07-03 DIAGNOSIS — I4821 Permanent atrial fibrillation: Secondary | ICD-10-CM | POA: Diagnosis not present

## 2019-07-03 DIAGNOSIS — E873 Alkalosis: Secondary | ICD-10-CM | POA: Diagnosis not present

## 2019-07-03 DIAGNOSIS — Z20822 Contact with and (suspected) exposure to covid-19: Secondary | ICD-10-CM | POA: Diagnosis not present

## 2019-07-03 DIAGNOSIS — I5032 Chronic diastolic (congestive) heart failure: Secondary | ICD-10-CM | POA: Diagnosis not present

## 2019-07-03 DIAGNOSIS — B182 Chronic viral hepatitis C: Secondary | ICD-10-CM | POA: Diagnosis present

## 2019-07-03 DIAGNOSIS — R350 Frequency of micturition: Secondary | ICD-10-CM | POA: Diagnosis not present

## 2019-07-03 LAB — POCT I-STAT 7, (LYTES, BLD GAS, ICA,H+H)
Acid-Base Excess: 10 mmol/L — ABNORMAL HIGH (ref 0.0–2.0)
Bicarbonate: 40.3 mmol/L — ABNORMAL HIGH (ref 20.0–28.0)
Calcium, Ion: 1.21 mmol/L (ref 1.15–1.40)
HCT: 45 % (ref 39.0–52.0)
Hemoglobin: 15.3 g/dL (ref 13.0–17.0)
O2 Saturation: 92 %
Patient temperature: 98.6
Potassium: 4.5 mmol/L (ref 3.5–5.1)
Sodium: 134 mmol/L — ABNORMAL LOW (ref 135–145)
TCO2: 43 mmol/L — ABNORMAL HIGH (ref 22–32)
pCO2 arterial: 83.1 mmHg (ref 32.0–48.0)
pH, Arterial: 7.294 — ABNORMAL LOW (ref 7.350–7.450)
pO2, Arterial: 77 mmHg — ABNORMAL LOW (ref 83.0–108.0)

## 2019-07-03 LAB — CBC
HCT: 46.7 % (ref 39.0–52.0)
Hemoglobin: 14.4 g/dL (ref 13.0–17.0)
MCH: 30.6 pg (ref 26.0–34.0)
MCHC: 30.8 g/dL (ref 30.0–36.0)
MCV: 99.2 fL (ref 80.0–100.0)
Platelets: 160 10*3/uL (ref 150–400)
RBC: 4.71 MIL/uL (ref 4.22–5.81)
RDW: 14.1 % (ref 11.5–15.5)
WBC: 4.7 10*3/uL (ref 4.0–10.5)
nRBC: 0 % (ref 0.0–0.2)

## 2019-07-03 LAB — BASIC METABOLIC PANEL
Anion gap: 11 (ref 5–15)
BUN: 17 mg/dL (ref 6–20)
CO2: 36 mmol/L — ABNORMAL HIGH (ref 22–32)
Calcium: 8.8 mg/dL — ABNORMAL LOW (ref 8.9–10.3)
Chloride: 88 mmol/L — ABNORMAL LOW (ref 98–111)
Creatinine, Ser: 0.72 mg/dL (ref 0.61–1.24)
GFR calc Af Amer: >60 mL/min (ref >60)
GFR calc non Af Amer: >60 mL/min (ref >60)
Glucose, Bld: 160 mg/dL — ABNORMAL HIGH (ref 70–99)
Potassium: 4.5 mmol/L (ref 3.5–5.1)
Sodium: 135 mmol/L (ref 135–145)

## 2019-07-03 LAB — BRAIN NATRIURETIC PEPTIDE: B Natriuretic Peptide: 78.2 pg/mL (ref 0.0–100.0)

## 2019-07-03 LAB — SARS CORONAVIRUS 2 (TAT 6-24 HRS): SARS Coronavirus 2: NEGATIVE

## 2019-07-03 LAB — HIV ANTIBODY (ROUTINE TESTING W REFLEX): HIV Screen 4th Generation wRfx: NONREACTIVE

## 2019-07-03 LAB — ECHOCARDIOGRAM COMPLETE
Height: 71 in
Weight: 5568 oz

## 2019-07-03 MED ORDER — DILTIAZEM HCL ER COATED BEADS 240 MG PO CP24
240.0000 mg | ORAL_CAPSULE | Freq: Every day | ORAL | Status: DC
  Administered 2019-07-03 – 2019-07-09 (×7): 240 mg via ORAL
  Filled 2019-07-03 (×7): qty 1

## 2019-07-03 MED ORDER — LORATADINE 10 MG PO TABS
10.0000 mg | ORAL_TABLET | Freq: Every day | ORAL | Status: DC
  Administered 2019-07-03 – 2019-07-09 (×7): 10 mg via ORAL
  Filled 2019-07-03 (×7): qty 1

## 2019-07-03 MED ORDER — DILTIAZEM HCL ER COATED BEADS 240 MG PO CP24: 480.0000 mg | ORAL_CAPSULE | Freq: Every day | ORAL | Status: DC

## 2019-07-03 MED ORDER — AZELASTINE HCL 0.1 % NA SOLN
1.0000 | Freq: Two times a day (BID) | NASAL | Status: DC
  Administered 2019-07-03 – 2019-07-09 (×13): 1 via NASAL
  Filled 2019-07-03: qty 30

## 2019-07-03 MED ORDER — FLUTICASONE PROPIONATE 50 MCG/ACT NA SUSP
1.0000 | Freq: Every day | NASAL | Status: DC
  Administered 2019-07-03 – 2019-07-09 (×7): 1 via NASAL
  Filled 2019-07-03: qty 16

## 2019-07-03 MED ORDER — FUROSEMIDE 10 MG/ML IJ SOLN
80.0000 mg | Freq: Two times a day (BID) | INTRAMUSCULAR | Status: DC
  Administered 2019-07-03 – 2019-07-04 (×3): 80 mg via INTRAVENOUS
  Filled 2019-07-03 (×4): qty 8

## 2019-07-03 MED ORDER — PERFLUTREN LIPID MICROSPHERE
1.0000 mL | INTRAVENOUS | Status: AC | PRN
  Administered 2019-07-03: 09:00:00 6 mL via INTRAVENOUS
  Filled 2019-07-03: qty 10

## 2019-07-03 MED ORDER — GUAIFENESIN-DM 100-10 MG/5ML PO SYRP
5.0000 mL | ORAL_SOLUTION | ORAL | Status: DC | PRN
  Administered 2019-07-03 – 2019-07-08 (×10): 5 mL via ORAL
  Filled 2019-07-03 (×10): qty 5

## 2019-07-03 MED ORDER — ALBUTEROL SULFATE (2.5 MG/3ML) 0.083% IN NEBU
2.5000 mg | INHALATION_SOLUTION | Freq: Four times a day (QID) | RESPIRATORY_TRACT | Status: DC | PRN
  Administered 2019-07-03: 2.5 mg via RESPIRATORY_TRACT
  Filled 2019-07-03: qty 3

## 2019-07-03 MED ORDER — RIVAROXABAN 20 MG PO TABS
20.0000 mg | ORAL_TABLET | Freq: Every day | ORAL | Status: DC
  Administered 2019-07-03 – 2019-07-08 (×6): 20 mg via ORAL
  Filled 2019-07-03 (×7): qty 1

## 2019-07-03 NOTE — ED Notes (Signed)
Pt eating [employer] sandwich from home, [place] water provided per request.

## 2019-07-03 NOTE — Progress Notes (Signed)
Patient refused C-pap. Educated X3, patient continues to refuse stating he can't have anything over his face.

## 2019-07-03 NOTE — H&P (Addendum)
Date: 07/03/2019               Patient Name:  Frank Morales MRN: [id number]  DOB: [birth year] Age / Sex: 59 y.o., male   PCP: [name], MD         Medical Service: Internal Medicine Teaching Service         Attending Physician: Dr. Evette Doffing, Mallie Mussel, *    First Contact: Dr. Court Joy Pager: [phone]  Second Contact: Dr. Truman Hayward Pager: [phone]       After Hours (After 5p/  First Contact Pager: [phone]  weekends / holidays): Second Contact Pager: [phone]   Chief Complaint: SOB  History of Present Illness: Mr. Gongwer is a 59 yo M w/ a PMHx of HFpEF (LVEF 55-60% 01/2018) afib, HTN, obesity, tobacco use and history hepatitis C now cured here with progressive SOB and LE edema.  Mr. Salmo was in his usual state of health until around a week ago when he started to develop shortness of breath with orthopnea as well as increasing lower extremity edema.  The symptoms were getting worse as the week went on and he eventually presented to the ED tonight due to severe shortness of breath.  He endorses worsening of his shortness of breath with lying flat such that he is unable to lie flat at all and improvement of his shortness of breath with sitting up.  He reports that his lower extremities are always somewhat swollen but are more so than usual.  He has been taking his Lasix as prescribed and not missing any doses.  He does endorse a high salt diet recently and thinks this may have been the cause of his symptoms.  He thinks he may have gained 6 pounds during this time.  He reports improvement in his symptoms already since receiving IV Lasix in the emergency department. He denies any infectious symptoms such as fevers or chills.  He denies chest pain, abdominal pain, dysuria, falls.  He has no sick contacts.  He endorses continued tobacco and alcohol use.  He is hungry and has no other complaints or concerns at this time.  Meds:  Current Meds  Medication Sig   azelastine (ASTELIN) 0.1  % nasal spray INSTILL 2 SPRAYS INTO BOTH NOSTRILS TWICE DAILY   cetirizine (ZYRTEC) 10 MG tablet Take 1 tablet (10 mg total) by mouth daily.   diltiazem (CARDIZEM CD) 240 MG 24 hr capsule Take 2 capsules (480 mg total) by mouth daily.   fluticasone (FLONASE) 50 MCG/ACT nasal spray Place 1 spray into both nostrils daily.   furosemide (LASIX) 80 MG tablet TAKE 1 TABLET BY MOUTH TWICE DAILY   loratadine (CLARITIN) 10 MG tablet TAKE 1 TABLET(10 MG) BY MOUTH DAILY (Patient taking differently: Take 10 mg by mouth daily. )   potassium chloride (KLOR-CON) 10 MEQ tablet TAKE 6 TABLETS(60 MEQ) BY MOUTH TWICE DAILY   XARELTO 20 MG TABS tablet TAKE 1 TABLET(20 MG) BY MOUTH DAILY WITH SUPPER (Patient taking differently: Take 20 mg by mouth daily with supper. )   Allergies: Allergies as of 07/02/2019 - Review Complete 07/02/2019  Allergen Reaction Noted   Lactose intolerance (gi) Nausea And Vomiting 07/16/2016   Past Medical History:  Diagnosis Date   Atrial fibrillation and flutter ([place]) 08/17/2015   A. S/p failed DCCV // b. Severe BAE on echo >> rate control strategy (has seen AF clinic) // c. Xarelto for anticoag (CHADS2-VASc=2 / CHF, HTN)   Chronic diastolic CHF (  congestive heart failure) ([place]) 08/30/2015   A. Echo 6/17: Apical HK, moderate focal basal and mild concentric LVH, EF 50-55, diffuse HK, trivial MR, severe BAE, mild TR, PASP 37   Dependence on continuous supplemental oxygen    3L   Hepatitis C, chronic (HCC) 08/19/2015   History of cardiac catheterization    a. LHC 6/17: LAD irregs, o/w no CAD   Hypertension    OSA (obstructive sleep apnea) 11/21/2015   No Cpap   Sleep apnea    Tobacco abuse    Past Surgical History:  Procedure Laterality Date   CARDIAC CATHETERIZATION N/A 08/21/2015   Procedure: Right/Left Heart Cath and Coronary Angiography;  Surgeon: Leonie Man, MD;  Location: [facility];  Service: Cardiovascular;  Laterality: N/A;   CARDIOVERSION N/A 09/22/2015   Procedure:  CARDIOVERSION;  Surgeon: Josue Hector, MD;  Location: [facility] ENDOSCOPY;  Service: Cardiovascular;  Laterality: N/A;   COLONOSCOPY N/A 07/19/2016   Procedure: COLONOSCOPY;  Surgeon: Doran Stabler, MD;  Location: [facility] ENDOSCOPY;  Service: Gastroenterology;  Laterality: N/A;   ESOPHAGOGASTRODUODENOSCOPY N/A 07/19/2016   Procedure: ESOPHAGOGASTRODUODENOSCOPY (EGD);  Surgeon: Doran Stabler, MD;  Location: [facility];  Service: Gastroenterology;  Laterality: N/A;   NO PAST SURGERIES     SINUS ENDO WITH FUSION N/A 10/02/2018   Procedure: SINUS ENDO WITH FUSION;  Surgeon: Melissa Montane, MD;  Location: [facility] OR;  Service: ENT;  Laterality: N/A;   Family History:  Family History  Problem Relation Age of Onset   Hypertension Mother    Diabetes Mother    Pneumonia Father    Hypertension Maternal Grandfather    Colon cancer Neg Hx    Social History:  Social History   Tobacco Use   Smoking status: Current Every Day Smoker    Packs/day: 0.20    Types: Cigarettes   Smokeless tobacco: Never Used   Tobacco comment: 1 pack per week  Substance Use Topics   Alcohol use: Yes    Alcohol/week: 5.0 standard drinks    Types: 5 Standard drinks or equivalent per week    Comment: 2 times a week beer   Drug use: No  -Continues smoking around a quarter of a pack per day -Endorses alcohol use, around 1 beer per day  Review of Systems: A complete ROS was negative except as per HPI.   Physical Exam: Blood pressure 114/73, pulse 74, temperature 98.5 F (36.9 C), temperature source Oral, resp. rate 14, height 5\' 11"  (1.803 m), weight (!) 157.9 kg, SpO2 96 %.  Physical Exam  Constitutional:  Obese male sitting on the edge of the bed  HENT:  Head: Normocephalic and atraumatic.  Cardiovascular: Normal rate.  No murmur heard. Irregularly irregular rhythm; 3+ pitting edema  Pulmonary/Chest: He has no wheezes. He has rales. He exhibits no tenderness.  Crackles to the mid lung bilaterally  Abdominal: Soft.  Bowel sounds are normal. He exhibits no distension. There is no abdominal tenderness.  Neurological: He is alert.  Skin: Skin is warm and dry. No erythema.  Psychiatric: Affect normal.  Nursing note and vitals reviewed.  Labs: Results for orders placed or performed during the hospital encounter of 07/02/19 (from the past 24 hour(s))  Basic metabolic panel     Status: Abnormal   Collection Time: 07/02/19 10:45 PM  Result Value Ref Range   Sodium 136 135 - 145 mmol/L   Potassium 4.3 3.5 - 5.1 mmol/L   Chloride 92 (L) 98 - 111  mmol/L   CO2 37 (H) 22 - 32 mmol/L   Glucose, Bld 103 (H) 70 - 99 mg/dL   BUN 16 6 - 20 mg/dL   Creatinine, Ser 0.63 0.61 - 1.24 mg/dL   Calcium 8.8 (L) 8.9 - 10.3 mg/dL   GFR calc non Af Amer >60 >60 mL/min   GFR calc Af Amer >60 >60 mL/min   Anion gap 7 5 - 15  CBC with Differential     Status: Abnormal   Collection Time: 07/02/19 10:45 PM  Result Value Ref Range   WBC 6.4 4.0 - 10.5 K/uL   RBC 4.07 (L) 4.22 - 5.81 MIL/uL   Hemoglobin 12.6 (L) 13.0 - 17.0 g/dL   HCT 40.6 39.0 - 52.0 %   MCV 99.8 80.0 - 100.0 fL   MCH 31.0 26.0 - 34.0 pg   MCHC 31.0 30.0 - 36.0 g/dL   RDW 14.1 11.5 - 15.5 %   Platelets 160 150 - 400 K/uL   nRBC 0.0 0.0 - 0.2 %   Neutrophils Relative % 58 %   Neutro Abs 3.7 1.7 - 7.7 K/uL   Lymphocytes Relative 27 %   Lymphs Abs 1.7 0.7 - 4.0 K/uL   Monocytes Relative 13 %   Monocytes Absolute 0.8 0.1 - 1.0 K/uL   Eosinophils Relative 1 %   Eosinophils Absolute 0.1 0.0 - 0.5 K/uL   Basophils Relative 1 %   Basophils Absolute 0.0 0.0 - 0.1 K/uL   Immature Granulocytes 0 %   Abs Immature Granulocytes 0.02 0.00 - 0.07 K/uL  Brain natriuretic peptide     Status: None   Collection Time: 07/02/19 10:45 PM  Result Value Ref Range   B Natriuretic Peptide 78.2 0.0 - 100.0 pg/mL   EKG: personally reviewed my interpretation is afib with low voltage  CXR: personally reviewed my interpretation is mild pulmonary edema.  Assessment:  Mr.  Josh is a 59 yo M w/ a PMHx of HFpEF (LVEF 55-60% 01/2018) afib, HTN, obesity, tobacco use and history of hep C now cured here with progressive SOB and LE edema concerning for HFpEF exacerbation.  Plan by Problem:  HFpEF Exacerbation: -pt presenting with a week of progressive SOB and LE edema and 6 lb weight gain with signs and sx of volume overload consistent with hfpef exacerbation -pt HDS on presentation, satting well on home O2 (3.5 L) although reportedly he desatted to the 70s with EMS and required 15L HFNC -on exam pt has rales to the mid lung and 3+ pitting edema in his LEs but otherwise is well appearing  -interestingly, pt does not have an elevated BNP -chest x-ray demonstrates increased pulmonary edema -got 80 mg IV lasix in the ED with improvement in his symptoms are  Plan: -IV lasix 80 mg BID -ABG -continue home dilt -tele -strict I/Os -Follow-up a.m. BMP -Echo -Carb modified heart healthy diet  Afib: -continue home xarelto and dilt  HTN: -continue IV lasix and home dilt  Sinusitis: -continue home flonase -Continue home Claritin -Continue home azelastine  Principal Problem:   Acute respiratory failure with hypoxia (HCC) Active Problems:   Morbid obesity ([place])   Current smoker   Hepatitis C, chronic (HCC)   OSA (obstructive sleep apnea)   Permanent atrial fibrillation  Dispo: Admit patient to Inpatient with expected length of stay greater than 2 midnights.  Signed: Al Decant, MD 07/03/2019, 2:39 AM  Pager: [phone]

## 2019-07-03 NOTE — ED Notes (Signed)
Pt is A-fib on the monitor ?

## 2019-07-03 NOTE — Progress Notes (Signed)
   Subjective:  Frank Morales is a 59 y.o. with PMH of hypertension, obesity, OSA, chronic hypoxic respiratory failure on 3 L/min of supplemental oxygen at home, and permanent atrial fibrillation admitted for acute on chronic HFpEF  on hospital day 0   Frank Morales was examined and evaluated at bedside this am. He mentions that he is coming in with heart failure exacerbation due eating pizza and olives. He mentions prior history of these exacerbations with last episode being about a year and half ago. He mentions that he can feel palpitations with his A.fib and states he has been feeling his A.fib more frequently.  Objective:  Vital signs in last 24 hours: Vitals:   07/03/19 0330 07/03/19 0417 07/03/19 0515 07/03/19 0600  BP:  (!) 142/87 (!) 146/91 113/63  Pulse: 100 (!) 102 (!) 111 99  Resp:  (!) 22 20 20   Temp:      TempSrc:      SpO2: 95% 96% 90% 95%  Weight:      Height:        General: NAD, obese HE: Normocephalic, atraumatic , EOMI, Conjunctivae normal ENT: No congestion, no rhinorrhea, no exudate or erythema  Cardiovascular: irregularly irregular rythm.  No murmurs, rubs, or gallops Pulmonary : On supplemental oxygen, bilateral rales, no wheezes or rhonchi Abdominal: soft, nontender,  bowel sounds present Ext: 2+ pitting edema in lower extremities   Assessment/Plan:  Principal Problem:   Acute respiratory failure with hypoxia (HCC) Active Problems:   Morbid obesity ([place])   Current smoker   Hepatitis C, chronic (HCC)   OSA (obstructive sleep apnea)   Permanent atrial fibrillation  Frank Morales is a 59 year old male with a past medical history of hypertension, HFpEF, obesity, OSA chronic respiratory failure  and atrial fibrillation who presents for HFpEF exacerbation.  Acute on chronic HFpEF Patient endorses high salt diet recently and seems less likely cause. Echo shows EF 55 to 60% with no regional wall motion abnormalities. Patient also has permanent atrial  fibrillation and possibly contributing.  Dry weight may be around 330lbs (149kg ), currently 157.9 kg.  Dr. Evette Doffing evaluated with POCUS and patient has a large right pleural effusion.  We will consider consult IR for drainage if not improved with diuresis. Plan: -IV Lasix 80 mg twice daily -Strict ins and outs -Trend BMP - CPAP nightly    Permanent atrial fibrillation: Patient on diltiazem for rate control. Plan: -Continue diltiazem 240 mg -Continue Xarelto  Sinusitis: C/w home medications: Flonase, Claritin, azelastine  DVT prophx: Xarelto Diet: Heart healthy carb modified Bowel: N/A Code: Full  Dispo: Anticipated discharge in approximately 3-4 day(s).   Tamsen Snider, MD PGY1

## 2019-07-03 NOTE — ED Notes (Signed)
Tele   bfast ordered  

## 2019-07-04 DIAGNOSIS — E669 Obesity, unspecified: Secondary | ICD-10-CM

## 2019-07-04 LAB — CBC
HCT: 40.3 % (ref 39.0–52.0)
Hemoglobin: 12.9 g/dL — ABNORMAL LOW (ref 13.0–17.0)
MCH: 32.1 pg (ref 26.0–34.0)
MCHC: 32 g/dL (ref 30.0–36.0)
MCV: 100.2 fL — ABNORMAL HIGH (ref 80.0–100.0)
Platelets: 173 10*3/uL (ref 150–400)
RBC: 4.02 MIL/uL — ABNORMAL LOW (ref 4.22–5.81)
RDW: 14.1 % (ref 11.5–15.5)
WBC: 8.4 10*3/uL (ref 4.0–10.5)
nRBC: 0 % (ref 0.0–0.2)

## 2019-07-04 LAB — BASIC METABOLIC PANEL
Anion gap: 8 (ref 5–15)
BUN: 13 mg/dL (ref 6–20)
CO2: 40 mmol/L — ABNORMAL HIGH (ref 22–32)
Calcium: 8.9 mg/dL (ref 8.9–10.3)
Chloride: 89 mmol/L — ABNORMAL LOW (ref 98–111)
Creatinine, Ser: 0.67 mg/dL (ref 0.61–1.24)
GFR calc Af Amer: >60 mL/min (ref >60)
GFR calc non Af Amer: >60 mL/min (ref >60)
Glucose, Bld: 126 mg/dL — ABNORMAL HIGH (ref 70–99)
Potassium: 3.5 mmol/L (ref 3.5–5.1)
Sodium: 137 mmol/L (ref 135–145)

## 2019-07-04 LAB — MAGNESIUM: Magnesium: 2.2 mg/dL (ref 1.7–2.4)

## 2019-07-04 MED ORDER — POTASSIUM CHLORIDE CRYS ER 20 MEQ PO TBCR
40.0000 meq | EXTENDED_RELEASE_TABLET | Freq: Two times a day (BID) | ORAL | Status: AC
  Administered 2019-07-04 (×2): 40 meq via ORAL
  Filled 2019-07-04 (×2): qty 2

## 2019-07-04 MED ORDER — FUROSEMIDE 10 MG/ML IJ SOLN
80.0000 mg | Freq: Three times a day (TID) | INTRAMUSCULAR | Status: DC
  Administered 2019-07-04 – 2019-07-08 (×12): 80 mg via INTRAVENOUS
  Filled 2019-07-04 (×12): qty 8

## 2019-07-04 MED ORDER — PNEUMOCOCCAL VAC POLYVALENT 25 MCG/0.5ML IJ INJ: 0.5000 mL | INJECTION | INTRAMUSCULAR | Status: DC

## 2019-07-04 NOTE — Progress Notes (Signed)
   Subjective:  Frank Morales is a 59 y.o. with PMH of HTN, OSA, chronic hypoxic resp failure on 3L O2, permanent A.fib/flutter admit for acute on chronic heart failure on hospital day 1  Frank Morales was examined and evaluated at bedside this am. He states that he has been doing well with frequent urination. He had 1 episode of light-headedness overnight but overall no acute events. He states he would like to take a shower.  Objective:  Vital signs in last 24 hours: Vitals:   07/03/19 1400 07/03/19 1500 07/03/19 2024 07/04/19 0038  BP: 105/66 106/68 117/73 115/79  Pulse: (!) 109 95 84 96  Resp: (!) 26 17 18 18   Temp:   98.2 F (36.8 C)   TempSrc:   Oral   SpO2: 96% 95% 96% 97%  Weight:      Height:       Gen: Well-developed, obese, NAD HEENT: NCAT head, hearing intact, EOMI, [place] O2 in place, MMM CV: irregularly irregular, S1, S2 normal, No rubs, no murmurs, no gallops Pulm: Diminished breath sounds on R side, rales on L side up to mid thorax Abd: Soft, BS+, Distended w/ abdominal wall edema Extm: 3+ pitting edema up to thighs Skin: Dry, Warm, normal turgor  Assessment/Plan:  Principal Problem:   Acute respiratory failure with hypoxia (HCC) Active Problems:   Morbid obesity ([place])   Current smoker   OSA (obstructive sleep apnea)   Permanent atrial fibrillation  Frank Morales is a 59 yo M w/ PMH of HTN, HFpEF, obesity, permanent a.fib/flutter, OSA and chronic resp failure admit for acute on chronic diastolic heart failure  Acute on chronic diastolic heart failure Present w/ hypervolemia on exam. (157.9kg, lkdw 149kg) TTE 55-60%. No wall motion abnormalities. Likely due to dietary indiscretion. Urine output 2.4L yesterday. No new weight. K 3.5, Mag 2.2. Renal fx stable (0.72->0.67) - K-dur 41mEq - Increase frequency tofurosemide 80mg  TID - Daily weights - I & Os - Monitor electrolytes and replete as needed  OSA Need to use CPAP at nighttime. CO2 40 this am - CPAP  qhs  Permanent A.Fib/Flutter HR 63 this am. On dilt at home. On anticoagulation w/ Xarelto - C/w home meds: diltiazem 250mg , Xarelto  DVT prophx: Xarelto Diet: HH/CM Bowel: N/A Code: Full  Dispo: Anticipated discharge in approximately 3-4 day(s).   [name], MD 07/04/2019, 6:28 AM Pager: [phone]

## 2019-07-05 LAB — BASIC METABOLIC PANEL
Anion gap: 10 (ref 5–15)
BUN: 12 mg/dL (ref 6–20)
CO2: 42 mmol/L — ABNORMAL HIGH (ref 22–32)
Calcium: 8.7 mg/dL — ABNORMAL LOW (ref 8.9–10.3)
Chloride: 87 mmol/L — ABNORMAL LOW (ref 98–111)
Creatinine, Ser: 0.66 mg/dL (ref 0.61–1.24)
GFR calc Af Amer: >60 mL/min (ref >60)
GFR calc non Af Amer: >60 mL/min (ref >60)
Glucose, Bld: 105 mg/dL — ABNORMAL HIGH (ref 70–99)
Potassium: 3.9 mmol/L (ref 3.5–5.1)
Sodium: 139 mmol/L (ref 135–145)

## 2019-07-05 LAB — CBC
HCT: 42.4 % (ref 39.0–52.0)
Hemoglobin: 13.3 g/dL (ref 13.0–17.0)
MCH: 31.5 pg (ref 26.0–34.0)
MCHC: 31.4 g/dL (ref 30.0–36.0)
MCV: 100.5 fL — ABNORMAL HIGH (ref 80.0–100.0)
Platelets: 169 10*3/uL (ref 150–400)
RBC: 4.22 MIL/uL (ref 4.22–5.81)
RDW: 14.1 % (ref 11.5–15.5)
WBC: 5.9 10*3/uL (ref 4.0–10.5)
nRBC: 0 % (ref 0.0–0.2)

## 2019-07-05 LAB — MAGNESIUM: Magnesium: 2.3 mg/dL (ref 1.7–2.4)

## 2019-07-05 MED ORDER — PNEUMOCOCCAL VAC POLYVALENT 25 MCG/0.5ML IJ INJ
0.5000 mL | INJECTION | INTRAMUSCULAR | Status: DC
  Filled 2019-07-05: qty 0.5

## 2019-07-05 MED FILL — Perflutren Lipid Microsphere IV Susp 1.1 MG/ML: INTRAVENOUS | Qty: 10 | Status: AC

## 2019-07-05 NOTE — Progress Notes (Signed)
   Subjective:  Frank Morales is a 59 y.o. with PMH of HTN, OSA, chronic hypoxic resp failure on 3L O2, permanent A.fib/flutter admit for acute on chronic heart failure on hospital day 2  Patient sitting in chair on exam. Reports improvement in shortness of breath and edema. Discussed plan for continued diuresis.   Objective:  Vital signs in last 24 hours: Vitals:   07/04/19 2156 07/05/19 0820 07/05/19 0820 07/05/19 1125  BP: 118/83 104/83 104/83   Pulse: (!) 57 91 (!) 106   Resp: 20     Temp: 98 F (36.7 C) 97.6 F (36.4 C) 97.6 F (36.4 C)   TempSrc: Oral Oral Oral   SpO2: 100% 100% 100%   Weight:    (!) 155.6 kg  Height:        General: NAD, nl appearance HEENT: Normocephalic, atraumatic , EOMI, Conjunctivae normal Cardiovascular: irregularly irregular rhythm.  No murmurs, rubs, or gallops Pulmonary : Effort normal,decrease breath sounds on right lower lung field , rales in left lower lung field  Abdominal: soft, nontender,  bowel sounds present Ext: 2+ BLE edema,  Skin: Warm, dry   CBC Latest Ref Rng & Units 07/05/2019 07/04/2019 07/03/2019  WBC 4.0 - 10.5 K/uL 5.9 8.4 4.7  Hemoglobin 13.0 - 17.0 g/dL 13.3 12.9(L) 14.4  Hematocrit 39.0 - 52.0 % 42.4 40.3 46.7  Platelets 150 - 400 K/uL 169 173 160   BMP Latest Ref Rng & Units 07/05/2019 07/04/2019 07/03/2019  Glucose 70 - 99 mg/dL 105(H) 126(H) 160(H)  BUN 6 - 20 mg/dL 12 13 17   Creatinine 0.61 - 1.24 mg/dL 0.66 0.67 0.72  BUN/Creat Ratio 9 - 20 - - -  Sodium 135 - 145 mmol/L 139 137 135  Potassium 3.5 - 5.1 mmol/L 3.9 3.5 4.5  Chloride 98 - 111 mmol/L 87(L) 89(L) 88(L)  CO2 22 - 32 mmol/L 42(H) 40(H) 36(H)  Calcium 8.9 - 10.3 mg/dL 8.7(L) 8.9 8.8(L)   Lab Results  Component Value Date   MG 2.3 07/05/2019    Intake/Output Summary (Last 24 hours) at 07/05/2019 1129 Last data filed at 07/05/2019 1100 Gross per 24 hour  Intake 1146 ml  Output 5650 ml  Net -4504 ml     Assessment/Plan:  Principal Problem:   Acute respiratory failure with hypoxia (HCC) Active Problems:   Morbid obesity ([place])   Current smoker   OSA (obstructive sleep apnea)   Permanent atrial fibrillation  [name] is a 59 yo M w/ PMH of HTN, HFpEF, obesity, permanent a.fib/flutter, OSA and chronic resp failure admit for acute on chronic diastolic heart failure  Acute on chronic diastolic heart failure Present w/ hypervolemia on exam. Discussed recording daily weight with nursing,  lkdw 149kg. TTE 55-60%. No wall motion abnormalities. Likely due to dietary indiscretion. Urine output 4L yesterday.  K 3.9, Mag 2.3. Renal fx stable (.67>.62) - Continue furosemide 80mg  TID - Daily weights - I & Os - Monitor electrolytes and replete as needed  OSA Needs to use CPAP at nighttime. CO2 42 this am - CPAP qhs  Permanent A.Fib/Flutter HR 57  this am. On dilt at home. On anticoagulation w/ Xarelto - C/w home meds: diltiazem 250mg , Xarelto  DVT prophx: Xarelto Diet: HH/CM Bowel: N/A Code: Full  Dispo: Anticipated discharge in approximately 3-4 day(s).   Tamsen Snider, MD PGY1

## 2019-07-05 NOTE — Discharge Instructions (Addendum)
Information on my medicine - XARELTO (Rivaroxaban)  This medication education was reviewed with me or my healthcare representative as part of my discharge preparation.  Why was Xarelto prescribed for you? Xarelto was prescribed for you to reduce the risk of a blood clot forming that can cause a stroke if you have a medical condition called atrial fibrillation (a type of irregular heartbeat).  What do you need to know about xarelto ? Take your Xarelto ONCE DAILY at the same time every day with your evening meal. If you have difficulty swallowing the tablet whole, you may crush it and mix in applesauce just prior to taking your dose.  Take Xarelto exactly as prescribed by your doctor and DO NOT stop taking Xarelto without talking to the doctor who prescribed the medication.  Stopping without other stroke prevention medication to take the place of Xarelto may increase your risk of developing a clot that causes a stroke.  Refill your prescription before you run out.  After discharge, you should have regular check-up appointments with your healthcare provider that is prescribing your Xarelto.  In the future your dose may need to be changed if your kidney function or weight changes by a significant amount.  What do you do if you miss a dose? If you are taking Xarelto ONCE DAILY and you miss a dose, take it as soon as you remember on the same day then continue your regularly scheduled once daily regimen the next day. Do not take two doses of Xarelto at the same time or on the same day.   Important Safety Information A possible side effect of Xarelto is bleeding. You should call your healthcare provider right away if you experience any of the following: ? Bleeding from an injury or your nose that does not stop. ? Unusual colored urine (red or dark brown) or unusual colored stools (red or black). ? Unusual bruising for unknown reasons. ? A serious fall or if you hit your head (even if there  is no bleeding).  Some medicines may interact with Xarelto and might increase your risk of bleeding while on Xarelto. To help avoid this, consult your healthcare provider or pharmacist prior to using any new prescription or non-prescription medications, including herbals, vitamins, non-steroidal anti-inflammatory drugs (NSAIDs) and supplements.  This website has more information on Xarelto: [email or web address]  Thank you for trusting me with your care. To recap, you were treated for a heart failure exacerbation. You are doing much better after multiple days of intravenous lasix. Follow up with your primary care doctor as scheduled.

## 2019-07-06 LAB — CBC
HCT: 45.1 % (ref 39.0–52.0)
Hemoglobin: 13.7 g/dL (ref 13.0–17.0)
MCH: 30.2 pg (ref 26.0–34.0)
MCHC: 30.4 g/dL (ref 30.0–36.0)
MCV: 99.6 fL (ref 80.0–100.0)
Platelets: 164 10*3/uL (ref 150–400)
RBC: 4.53 MIL/uL (ref 4.22–5.81)
RDW: 13.9 % (ref 11.5–15.5)
WBC: 6 10*3/uL (ref 4.0–10.5)
nRBC: 0 % (ref 0.0–0.2)

## 2019-07-06 LAB — BASIC METABOLIC PANEL
Anion gap: 12 (ref 5–15)
BUN: 11 mg/dL (ref 6–20)
CO2: 41 mmol/L — ABNORMAL HIGH (ref 22–32)
Calcium: 8.7 mg/dL — ABNORMAL LOW (ref 8.9–10.3)
Chloride: 86 mmol/L — ABNORMAL LOW (ref 98–111)
Creatinine, Ser: 0.66 mg/dL (ref 0.61–1.24)
GFR calc Af Amer: >60 mL/min (ref >60)
GFR calc non Af Amer: >60 mL/min (ref >60)
Glucose, Bld: 121 mg/dL — ABNORMAL HIGH (ref 70–99)
Potassium: 4.2 mmol/L (ref 3.5–5.1)
Sodium: 139 mmol/L (ref 135–145)

## 2019-07-06 LAB — MAGNESIUM: Magnesium: 2.1 mg/dL (ref 1.7–2.4)

## 2019-07-06 NOTE — Plan of Care (Signed)
  Problem: Clinical Measurements: Goal: Ability to maintain clinical measurements within normal limits will improve Outcome: Progressing   Problem: Clinical Measurements: Goal: Respiratory complications will improve Outcome: Progressing   Problem: Clinical Measurements: Goal: Cardiovascular complication will be avoided Outcome: Progressing   

## 2019-07-06 NOTE — Progress Notes (Signed)
Pt refused CPAP

## 2019-07-06 NOTE — Progress Notes (Signed)
Subjective:  Frank Morales is a 59 y.o. with PMH of HTN, OSA, chronic hypoxic resp failure on 3L O2, permanent A.fib/flutter admit for acute on chronic heart failure on hospital day 3  He states he feels his diuresis going well and his swelling is improving. Discussed need to carefully track his urine output and daily weights. He mentions that he uses oxygen at home at 3Ls   Objective:  Vital signs in last 24 hours: Vitals:   07/05/19 0820 07/05/19 1125 07/05/19 1536 07/05/19 2152  BP: 104/83  128/66 118/87  Pulse: (!) 106  (!) 117 98  Resp:    19  Temp: 97.6 F (36.4 C)  98.6 F (37 C) 98.1 F (36.7 C)  TempSrc: Oral  Oral Oral  SpO2: 100%  100% 99%  Weight:  (!) 155.6 kg    Height:        General: NAD, nl appearance HEENT: Normocephalic, atraumatic , EOMI, Conjunctivae normal Cardiovascular: irregularly irregular rhythm.  No murmurs, rubs, or gallops Pulmonary : Effort normal,decrease breath sounds on right lower lung field , rales in left lower lung field  Abdominal: soft, nontender,  bowel sounds present Ext: 2+ BLE edema ( improving)  Skin: Warm, dry   CBC Latest Ref Rng & Units 07/06/2019 07/05/2019 07/04/2019  WBC 4.0 - 10.5 K/uL 6.0 5.9 8.4  Hemoglobin 13.0 - 17.0 g/dL 13.7 13.3 12.9(L)  Hematocrit 39.0 - 52.0 % 45.1 42.4 40.3  Platelets 150 - 400 K/uL 164 169 173   BMP Latest Ref Rng & Units 07/06/2019 07/05/2019 07/04/2019  Glucose 70 - 99 mg/dL 121(H) 105(H) 126(H)  BUN 6 - 20 mg/dL 11 12 13   Creatinine 0.61 - 1.24 mg/dL 0.66 0.66 0.67  BUN/Creat Ratio 9 - 20 - - -  Sodium 135 - 145 mmol/L 139 139 137  Potassium 3.5 - 5.1 mmol/L 4.2 3.9 3.5  Chloride 98 - 111 mmol/L 86(L) 87(L) 89(L)  CO2 22 - 32 mmol/L 41(H) 42(H) 40(H)  Calcium 8.9 - 10.3 mg/dL 8.7(L) 8.7(L) 8.9   Lab Results  Component Value Date   MG 2.1 07/06/2019    Intake/Output Summary (Last 24 hours) at 07/06/2019 0617 Last data filed at 07/05/2019 1100 Gross per 24 hour  Intake --  Output  1650 ml  Net -1650 ml     Assessment/Plan:  Principal Problem:   Acute respiratory failure with hypoxia (HCC) Active Problems:   Morbid obesity ([place])   Current smoker   OSA (obstructive sleep apnea)   Permanent atrial fibrillation  Frank Morales is a 59 yo M w/ PMH of HTN, HFpEF, obesity, permanent a.fib/flutter, OSA and chronic resp failure admit for acute on chronic diastolic heart failure  Acute on chronic diastolic heart failure Present w/ hypervolemia on exam. No wall motion abnormalities. Likely due to dietary indiscretion.Weight on admission 157.9 > today 154.7 ( lkdw 149kg). Neg negative 7 L during this admission, doesn't match weights. TTE 55-60%.  Urine output 1.65 L yesterday.  K 4.2, Mag 2.1. Renal fx stable  - Continue furosemide 80 mg TID - 1500 ml fluid restriction - Daily weights - I & Os - Monitor electrolytes and replete as needed  OSA Needs to use CPAP at nighttime.  - CPAP qhs  Permanent A.Fib/Flutter HR 98 this am. On dilt at home. On anticoagulation w/ Xarelto - C/w home meds: diltiazem 250mg , Xarelto  DVT prophx: Xarelto Diet: HH/CM Bowel: N/A Code: Full  Dispo: Anticipated discharge in approximately 3-4 day(s).   Frank Morales  Court Joy, MD PGY1

## 2019-07-07 LAB — BASIC METABOLIC PANEL
Anion gap: 7 (ref 5–15)
BUN: 10 mg/dL (ref 6–20)
CO2: 46 mmol/L — ABNORMAL HIGH (ref 22–32)
Calcium: 8.8 mg/dL — ABNORMAL LOW (ref 8.9–10.3)
Chloride: 86 mmol/L — ABNORMAL LOW (ref 98–111)
Creatinine, Ser: 0.72 mg/dL (ref 0.61–1.24)
GFR calc Af Amer: >60 mL/min (ref >60)
GFR calc non Af Amer: >60 mL/min (ref >60)
Glucose, Bld: 98 mg/dL (ref 70–99)
Potassium: 4 mmol/L (ref 3.5–5.1)
Sodium: 139 mmol/L (ref 135–145)

## 2019-07-07 LAB — MAGNESIUM: Magnesium: 2.3 mg/dL (ref 1.7–2.4)

## 2019-07-07 NOTE — Progress Notes (Signed)
Subjective:  Frank Morales is a 59 y.o. with PMH of HTN, OSA, chronic hypoxic resp failure on 3L O2, permanent A.fib/flutter admit for acute on chronic heart failure on hospital day 4  Patient reports he continues to feel better with diuresis. No other concerns on exam.   Objective:  Vital signs in last 24 hours: Vitals:   07/06/19 0800 07/06/19 0846 07/06/19 1739 07/06/19 2146  BP:  112/79 101/85 (!) 132/95  Pulse:  75 77 87  Resp:  18 17 18   Temp:    97.7 F (36.5 C)  TempSrc:    Oral  SpO2:  98% 99% 100%  Weight: (!) 154.7 kg     Height:        General: NAD, nl appearance HEENT: Normocephalic, atraumatic , EOMI, Conjunctivae normal Cardiovascular: irregularly irregular rhythm.  No murmurs, rubs, or gallops Pulmonary : Effort normal,decrease breath sounds on right lower lung field , rales in left base  Abdominal: soft, nontender,  bowel sounds present Ext: 1+ BLE edema in ankles ( improving)  Skin: Warm, dry   CBC Latest Ref Rng & Units 07/06/2019 07/05/2019 07/04/2019  WBC 4.0 - 10.5 K/uL 6.0 5.9 8.4  Hemoglobin 13.0 - 17.0 g/dL 13.7 13.3 12.9(L)  Hematocrit 39.0 - 52.0 % 45.1 42.4 40.3  Platelets 150 - 400 K/uL 164 169 173   BMP Latest Ref Rng & Units 07/07/2019 07/06/2019 07/05/2019  Glucose 70 - 99 mg/dL 98 121(H) 105(H)  BUN 6 - 20 mg/dL 10 11 12   Creatinine 0.61 - 1.24 mg/dL 0.72 0.66 0.66  BUN/Creat Ratio 9 - 20 - - -  Sodium 135 - 145 mmol/L 139 139 139  Potassium 3.5 - 5.1 mmol/L 4.0 4.2 3.9  Chloride 98 - 111 mmol/L 86(L) 86(L) 87(L)  CO2 22 - 32 mmol/L 46(H) 41(H) 42(H)  Calcium 8.9 - 10.3 mg/dL 8.8(L) 8.7(L) 8.7(L)   Lab Results  Component Value Date   MG 2.3 07/07/2019    Intake/Output Summary (Last 24 hours) at 07/07/2019 [id number] Last data filed at 07/07/2019 0100 Gross per 24 hour  Intake --  Output 1700 ml  Net -1700 ml     Assessment/Plan:  Principal Problem:   Acute respiratory failure with hypoxia (HCC) Active Problems:   Morbid  obesity ([place])   Current smoker   OSA (obstructive sleep apnea)   Permanent atrial fibrillation  Frank Morales is a 59 yo M w/ PMH of HTN, HFpEF, obesity, permanent a.fib/flutter, OSA and chronic resp failure admit for acute on chronic diastolic heart failure  Acute on chronic diastolic heart failure Present w/ hypervolemia on exam. No wall motion abnormalities. Likely due to dietary indiscretion.Weight on admission 157.9 > today up from 154.7> 156.2 ( lkdw 149kg). Neg negative 8 L during this admission, doesn't match weights. TTE 55-60%.  Urine output 1.65 L yesterday.  K 4.0, Mag 2.3. Cr trending up, but in normal range. Will continue diuresis today, anticipate switching to home lasix tomorrow. Plan to reevaluate pulmonary effusion this afternoon with ultrasound.  - Continue furosemide 80 mg TID - 1500 ml fluid restriction - Daily weights - I & Os - Monitor electrolytes and replete as needed  OSA Needs to use CPAP at nighttime. Refuses CPAP. - CPAP qhs  Permanent A.Fib/Flutter HR 87 this am. On dilt at home. On anticoagulation w/ Xarelto - C/w home meds: diltiazem 250mg , Xarelto  DVT prophx: Xarelto Diet: HH/CM Bowel: N/A Code: Full  Dispo: Anticipated discharge in approximately 1-2 day(s) . Tamsen Snider,  MD PGY1

## 2019-07-07 NOTE — Progress Notes (Signed)
Patient refused CPAP for the night  

## 2019-07-08 DIAGNOSIS — I5043 Acute on chronic combined systolic (congestive) and diastolic (congestive) heart failure: Secondary | ICD-10-CM

## 2019-07-08 LAB — CBC
HCT: 42.4 % (ref 39.0–52.0)
Hemoglobin: 13.6 g/dL (ref 13.0–17.0)
MCH: 31.8 pg (ref 26.0–34.0)
MCHC: 32.1 g/dL (ref 30.0–36.0)
MCV: 99.1 fL (ref 80.0–100.0)
Platelets: 167 10*3/uL (ref 150–400)
RBC: 4.28 MIL/uL (ref 4.22–5.81)
RDW: 13.7 % (ref 11.5–15.5)
WBC: 4.8 10*3/uL (ref 4.0–10.5)
nRBC: 0 % (ref 0.0–0.2)

## 2019-07-08 LAB — BASIC METABOLIC PANEL
Anion gap: 8 (ref 5–15)
BUN: 9 mg/dL (ref 6–20)
CO2: 44 mmol/L — ABNORMAL HIGH (ref 22–32)
Calcium: 8.9 mg/dL (ref 8.9–10.3)
Chloride: 85 mmol/L — ABNORMAL LOW (ref 98–111)
Creatinine, Ser: 0.68 mg/dL (ref 0.61–1.24)
GFR calc Af Amer: >60 mL/min (ref >60)
GFR calc non Af Amer: >60 mL/min (ref >60)
Glucose, Bld: 103 mg/dL — ABNORMAL HIGH (ref 70–99)
Potassium: 4.1 mmol/L (ref 3.5–5.1)
Sodium: 137 mmol/L (ref 135–145)

## 2019-07-08 MED ORDER — FUROSEMIDE 80 MG PO TABS
80.0000 mg | ORAL_TABLET | Freq: Three times a day (TID) | ORAL | Status: DC
  Administered 2019-07-08 – 2019-07-09 (×3): 80 mg via ORAL
  Filled 2019-07-08 (×3): qty 1

## 2019-07-08 NOTE — Progress Notes (Signed)
Patient refuses to wear CPAP.

## 2019-07-08 NOTE — Progress Notes (Signed)
Subjective:  Frank Morales is a 59 y.o. with PMH of HTN, OSA, chronic hypoxic resp failure on 3L O2, permanent A.fib/flutter admit for acute on chronic heart failure on hospital day 5  Frank Morales was examined and evaluated at bedside this am. He states his leg swelling appear to be improving. He mentions not liking the CPAP machine especially as he experiences orthopnea at the same time. Discussed stable renal function and continued good urinary output yesterday and plan to continue w/ diuresis.  Objective:  Vital signs in last 24 hours: Vitals:   07/07/19 1008 07/07/19 1017 07/07/19 1824 07/07/19 2220  BP: 130/84  (!) 147/87 118/78  Pulse: 99  81 81  Resp: 16  17   Temp: 98.8 F (37.1 C)   97.9 F (36.6 C)  TempSrc: Oral   Oral  SpO2: 97%  99% 100%  Weight:  (!) 156.2 kg    Height:        General: NAD, nl appearance HEENT: Normocephalic, atraumatic , EOMI, Conjunctivae normal Cardiovascular: irregularly irregular rhythm.  No murmurs, rubs, or gallops Pulmonary : Effort normal,rales at the bases, no rhochi Abdominal: soft, nontender,  bowel sounds present Ext: 1+ BLE edema in ankles  Skin: Warm, dry   CBC Latest Ref Rng & Units 07/08/2019 07/06/2019 07/05/2019  WBC 4.0 - 10.5 K/uL 4.8 6.0 5.9  Hemoglobin 13.0 - 17.0 g/dL 13.6 13.7 13.3  Hematocrit 39.0 - 52.0 % 42.4 45.1 42.4  Platelets 150 - 400 K/uL 167 164 169   BMP Latest Ref Rng & Units 07/08/2019 07/07/2019 07/06/2019  Glucose 70 - 99 mg/dL 103(H) 98 121(H)  BUN 6 - 20 mg/dL 9 10 11   Creatinine 0.61 - 1.24 mg/dL 0.68 0.72 0.66  BUN/Creat Ratio 9 - 20 - - -  Sodium 135 - 145 mmol/L 137 139 139  Potassium 3.5 - 5.1 mmol/L 4.1 4.0 4.2  Chloride 98 - 111 mmol/L 85(L) 86(L) 86(L)  CO2 22 - 32 mmol/L 44(H) 46(H) 41(H)  Calcium 8.9 - 10.3 mg/dL 8.9 8.8(L) 8.7(L)   Lab Results  Component Value Date   MG 2.3 07/07/2019    Intake/Output Summary (Last 24 hours) at 07/08/2019 [id number] Last data filed at 07/08/2019  [id number] Gross per 24 hour  Intake --  Output 4200 ml  Net -4200 ml     Assessment/Plan:  Principal Problem:   Acute respiratory failure with hypoxia (HCC) Active Problems:   Morbid obesity ([place])   Current smoker   OSA (obstructive sleep apnea)   Permanent atrial fibrillation  Frank Morales is a 59 yo M w/ PMH of HTN, HFpEF, obesity, permanent a.fib/flutter, OSA and chronic resp failure admit for acute on chronic diastolic heart failure  Acute on chronic diastolic heart failure Present w/ hypervolemia on exam. TTE 55-60%.  No wall motion abnormalities. Likely due to dietary indiscretion. Weight on admission do not match diuresis. (lkdw 149kg). Neg negative 12.5 L during this admission. 4.2 L urine. Renal function stable. Bicarb 44, secondary to contraction alkalosis . Potassium nl.  - [place] right pleural effusion - Stop IV lasix - Start PO Lasix 80mg  TID - 1500 ml fluid restriction - Daily weights - I & Os - Monitor electrolytes and replete as needed  OSA Needs to use CPAP at nighttime. Refuses CPAP. - CPAP qhs  Permanent A.Fib/Flutter HR 87 this am. On dilt at home. On anticoagulation w/ Xarelto - C/w home meds: diltiazem 250mg , Xarelto  DVT prophx: Xarelto Diet: HH/CM Bowel: N/A Code: Full  Dispo: Anticipated discharge in approximately 0-1 day(s) . Tamsen Snider, MD PGY1

## 2019-07-09 ENCOUNTER — Inpatient Hospital Stay (HOSPITAL_COMMUNITY): Payer: Medicaid Other

## 2019-07-09 HISTORY — PX: IR THORACENTESIS ASP PLEURAL SPACE W/IMG GUIDE: IMG5380

## 2019-07-09 LAB — BASIC METABOLIC PANEL
Anion gap: 8 (ref 5–15)
BUN: 8 mg/dL (ref 6–20)
CO2: 43 mmol/L — ABNORMAL HIGH (ref 22–32)
Calcium: 8.8 mg/dL — ABNORMAL LOW (ref 8.9–10.3)
Chloride: 87 mmol/L — ABNORMAL LOW (ref 98–111)
Creatinine, Ser: 0.65 mg/dL (ref 0.61–1.24)
GFR calc Af Amer: >60 mL/min (ref >60)
GFR calc non Af Amer: >60 mL/min (ref >60)
Glucose, Bld: 121 mg/dL — ABNORMAL HIGH (ref 70–99)
Potassium: 3.6 mmol/L (ref 3.5–5.1)
Sodium: 138 mmol/L (ref 135–145)

## 2019-07-09 LAB — CBC
HCT: 41.9 % (ref 39.0–52.0)
Hemoglobin: 13.3 g/dL (ref 13.0–17.0)
MCH: 31.6 pg (ref 26.0–34.0)
MCHC: 31.7 g/dL (ref 30.0–36.0)
MCV: 99.5 fL (ref 80.0–100.0)
Platelets: 165 10*3/uL (ref 150–400)
RBC: 4.21 MIL/uL — ABNORMAL LOW (ref 4.22–5.81)
RDW: 13.6 % (ref 11.5–15.5)
WBC: 5.2 10*3/uL (ref 4.0–10.5)
nRBC: 0 % (ref 0.0–0.2)

## 2019-07-09 LAB — MAGNESIUM: Magnesium: 2.2 mg/dL (ref 1.7–2.4)

## 2019-07-09 MED ORDER — POTASSIUM CHLORIDE CRYS ER 20 MEQ PO TBCR
40.0000 meq | EXTENDED_RELEASE_TABLET | Freq: Once | ORAL | Status: AC
  Administered 2019-07-09: 40 meq via ORAL
  Filled 2019-07-09: qty 2

## 2019-07-09 MED ORDER — LIDOCAINE HCL 1 % IJ SOLN
INTRAMUSCULAR | Status: AC
  Filled 2019-07-09: qty 20

## 2019-07-09 MED ORDER — LIDOCAINE HCL 1 % IJ SOLN
INTRAMUSCULAR | Status: DC | PRN
  Administered 2019-07-09: 5 mL

## 2019-07-09 NOTE — Discharge Summary (Signed)
Name: Frank Morales MRN: [id number] DOB: 1960/08/18 59 y.o. PCP: Frank Noble, MD  Date of Admission: 07/02/2019 10:17 PM Date of Discharge:  Attending Physician: Frank Falcon, MD  Discharge Diagnosis: 1. Acute on chronic diastolic heart failure 2. Right pleural effusion   Discharge Medications: Allergies as of 07/09/2019       Reactions   Lactose Intolerance (gi) Nausea And Vomiting        Medication List     STOP taking these medications    cetirizine 10 MG tablet Commonly known as: ZYRTEC   HYDROcodone-acetaminophen 5-325 MG tablet Commonly known as: NORCO/VICODIN   levofloxacin 500 MG tablet Commonly known as: LEVAQUIN       TAKE these medications    azelastine 0.1 % nasal spray Commonly known as: ASTELIN INSTILL 2 SPRAYS INTO BOTH NOSTRILS TWICE DAILY   diltiazem 240 MG 24 hr capsule Commonly known as: CARDIZEM CD Take 2 capsules (480 mg total) by mouth daily.   fluticasone 50 MCG/ACT nasal spray Commonly known as: FLONASE Place 1 spray into both nostrils daily.   furosemide 80 MG tablet Commonly known as: LASIX TAKE 1 TABLET BY MOUTH TWICE DAILY   loratadine 10 MG tablet Commonly known as: CLARITIN TAKE 1 TABLET(10 MG) BY MOUTH DAILY What changed: See the new instructions.   potassium chloride 10 MEQ tablet Commonly known as: KLOR-CON TAKE 6 TABLETS(60 MEQ) BY MOUTH TWICE DAILY   Xarelto 20 MG Tabs tablet Generic drug: rivaroxaban TAKE 1 TABLET(20 MG) BY MOUTH DAILY WITH SUPPER What changed: See the new instructions.        Disposition and follow-up:   Frank Morales was discharged from [facility] in Stable condition.  At the hospital follow up visit please address:  1.    Acute on chronic diastolic heart failure - Assess volume status  2.  Labs / imaging needed at time of follow-up: BMP  3.  Pending labs/ test needing follow-up: na  Follow-up Appointments:    Hospital Course by  problem list: 1.   Acute on chronic diastolic heart failure Present w/ hypervolemia on exam. TTE 55-60%.  No wall motion abnormalities. Likely due to dietary indiscretion. Weight on admission do not match diuresis. (lkdw 149kg). Neg negative ~16L  during this admission. 5.65 L output yesterday with PO lasix. Renal function stable. 800cc off with right pleural effusion Patient has been diuresed and appears very close to euvolumic on exam. Discharged on 80 mg BID. Given instructions to weight himself at home and call physician if he gains more than 3 lbs in a day or 5 lbs in a week.  OSA Needs to use CPAP at nighttime. Refused CPAP.  Permanent A.Fib/Flutter Rate controlled On dilt at home. On anticoagulation w/ Xarelto - C/w home meds: diltiazem 250mg , Xarelto   Discharge Vitals:   BP (!) 96/54 (BP Location: Right Arm)   Pulse 62   Temp 97.7 F (36.5 C) (Oral)   Resp 15   Ht 5\' 11"  (1.803 m)   Wt (!) 157.2 kg   SpO2 95%   BMI 48.34 kg/m   Pertinent Labs, Studies, and Procedures:  CBC Latest Ref Rng & Units 07/09/2019 07/08/2019 07/06/2019  WBC 4.0 - 10.5 K/uL 5.2 4.8 6.0  Hemoglobin 13.0 - 17.0 g/dL 13.3 13.6 13.7  Hematocrit 39.0 - 52.0 % 41.9 42.4 45.1  Platelets 150 - 400 K/uL 165 167 164   BMP Latest Ref Rng & Units 07/09/2019 07/08/2019 07/07/2019  Glucose 70 - 99 mg/dL 121(H) 103(H) 98  BUN 6 - 20 mg/dL 8 9 10   Creatinine 0.61 - 1.24 mg/dL 0.65 0.68 0.72  BUN/Creat Ratio 9 - 20 - - -  Sodium 135 - 145 mmol/L 138 137 139  Potassium 3.5 - 5.1 mmol/L 3.6 4.1 4.0  Chloride 98 - 111 mmol/L 87(L) 85(L) 86(L)  CO2 22 - 32 mmol/L 43(H) 44(H) 46(H)  Calcium 8.9 - 10.3 mg/dL 8.8(L) 8.9 8.8(L)   EXAM: CHEST  1 VIEW  COMPARISON:  Chest x-ray 07/02/2019.  FINDINGS: Lung volumes are slightly low. Bibasilar opacities (right greater than left) compatible with areas of subsegmental atelectasis. Small right pleural effusion, decreased compared to the prior examination. No  appreciable pneumothorax. No evidence of pulmonary edema. Heart size is mildly enlarged. Upper mediastinal contours are within normal limits.  IMPRESSION: 1. Decreasing small right pleural effusion following thoracentesis. No pneumothorax or other acute complicating features. 2. Low lung volumes with bibasilar subsegmental atelectasis. 3. Mild cardiomegaly.  Discharge Instructions: Discharge Instructions     (HEART FAILURE PATIENTS) Call MD:  Anytime you have any of the following symptoms: 1) 3 pound weight gain in 24 hours or 5 pounds in 1 week 2) shortness of breath, with or without a dry hacking cough 3) swelling in the hands, feet or stomach 4) if you have to sleep on extra pillows at night in order to breathe.   Complete by: As directed    Diet - low sodium heart healthy   Complete by: As directed    Increase activity slowly   Complete by: As directed        Signed:  Tamsen Snider, MD PGY1

## 2019-07-09 NOTE — Procedures (Signed)
PROCEDURE SUMMARY:  Successful image-guided right thoracentesis. Yielded 800 milliliters of amber fluid. Patient tolerated procedure well. EBL: Zero No immediate complications.  Specimen was not sent for labs. Post procedure CXR shows no pneumothorax.  Please see imaging section of Epic for full dictation.  Joaquim Nam PA-C 07/09/2019 10:42 AM

## 2019-07-09 NOTE — Progress Notes (Signed)
Subjective:  BLAYTON FARE is a 59 y.o. with PMH of HTN, OSA, chronic hypoxic resp failure on 3L O2, permanent A.fib/flutter admit for acute on chronic heart failure on hospital day 6   Patient feels good this morning and reports his thoracentesis went well. Discussed discharge and given heart failure instructions.   Objective:  Vital signs in last 24 hours: Vitals:   07/08/19 0945 07/08/19 1604 07/08/19 2100 07/08/19 2334  BP:  104/69  (!) 108/52  Pulse:  (!) 103 100 77  Resp:   20 15  Temp:  97.8 F (36.6 C)  98.1 F (36.7 C)  TempSrc:      SpO2:  100% 100% 100%  Weight: (!) 155.2 kg     Height:        General: NAD, nl appearance HEENT: Normocephalic, atraumatic , EOMI, Conjunctivae normal Cardiovascular: irregularly irregular rhythm.  No murmurs, rubs, or gallops Pulmonary : CTAB, no rales or rhonchi  Abdominal: soft, nontender,  bowel sounds present Ext: 1+ edema in ankles  Skin: Warm, dry   CBC Latest Ref Rng & Units 07/09/2019 07/08/2019 07/06/2019  WBC 4.0 - 10.5 K/uL 5.2 4.8 6.0  Hemoglobin 13.0 - 17.0 g/dL 13.3 13.6 13.7  Hematocrit 39.0 - 52.0 % 41.9 42.4 45.1  Platelets 150 - 400 K/uL 165 167 164   BMP Latest Ref Rng & Units 07/09/2019 07/08/2019 07/07/2019  Glucose 70 - 99 mg/dL 121(H) 103(H) 98  BUN 6 - 20 mg/dL 8 9 10   Creatinine 0.61 - 1.24 mg/dL 0.65 0.68 0.72  BUN/Creat Ratio 9 - 20 - - -  Sodium 135 - 145 mmol/L 138 137 139  Potassium 3.5 - 5.1 mmol/L 3.6 4.1 4.0  Chloride 98 - 111 mmol/L 87(L) 85(L) 86(L)  CO2 22 - 32 mmol/L 43(H) 44(H) 46(H)  Calcium 8.9 - 10.3 mg/dL 8.8(L) 8.9 8.8(L)   Lab Results  Component Value Date   MG 2.3 07/07/2019    Intake/Output Summary (Last 24 hours) at 07/09/2019 0616 Last data filed at 07/09/2019 0530 Gross per 24 hour  Intake --  Output 5650 ml  Net -5650 ml     Assessment/Plan:  Principal Problem:   Acute respiratory failure with hypoxia (HCC) Active Problems:   Morbid obesity ([place])   Current  smoker   OSA (obstructive sleep apnea)   Permanent atrial fibrillation   Acute on chronic combined systolic and diastolic congestive heart failure (HCC)  Mr.Boydstun is a 59 yo M w/ PMH of HTN, HFpEF, obesity, permanent a.fib/flutter, OSA and chronic resp failure admit for acute on chronic diastolic heart failure  Acute on chronic diastolic heart failure Present w/ hypervolemia on exam. TTE 55-60%.  No wall motion abnormalities. Likely due to dietary indiscretion. Weight on admission do not match diuresis. (lkdw 149kg). Neg negative ~16L  during this admission. 5.65 L output yesterday with PO lasix. Renal function stable. 800cc off with right pleural effusion - Patient has been diuresed and appears very close to euvolumic on exam.  Plan: - C/w PO Lasix 80mg  TID, will resume lasix 80 mg BID at discharge - 1500 ml fluid restriction - Daily weights - I & Os - Monitor electrolytes and replete as needed  OSA Needs to use CPAP at nighttime. Refuses CPAP. - CPAP qhs  Permanent A.Fib/Flutter Rate controlled On dilt at home. On anticoagulation w/ Xarelto - C/w home meds: diltiazem 250mg , Xarelto  DVT prophx: Xarelto Diet: HH/CM Bowel: N/A Code: Full  Dispo: Anticipated discharge in approximately 0-1  day(s) . Tamsen Snider, MD PGY1

## 2019-07-19 ENCOUNTER — Telehealth: Payer: Self-pay | Admitting: *Deleted

## 2019-07-19 ENCOUNTER — Ambulatory Visit (INDEPENDENT_AMBULATORY_CARE_PROVIDER_SITE_OTHER): Payer: Medicaid Other | Admitting: Internal Medicine

## 2019-07-19 VITALS — BP 113/61 | HR 100 | Temp 99.0°F | Wt 353.1 lb

## 2019-07-19 DIAGNOSIS — Z23 Encounter for immunization: Secondary | ICD-10-CM

## 2019-07-19 DIAGNOSIS — Z0000 Encounter for general adult medical examination without abnormal findings: Secondary | ICD-10-CM | POA: Diagnosis not present

## 2019-07-19 DIAGNOSIS — Z79899 Other long term (current) drug therapy: Secondary | ICD-10-CM | POA: Diagnosis not present

## 2019-07-19 DIAGNOSIS — I110 Hypertensive heart disease with heart failure: Secondary | ICD-10-CM

## 2019-07-19 DIAGNOSIS — I5032 Chronic diastolic (congestive) heart failure: Secondary | ICD-10-CM | POA: Diagnosis not present

## 2019-07-19 DIAGNOSIS — I10 Essential (primary) hypertension: Secondary | ICD-10-CM

## 2019-07-19 MED ORDER — METOLAZONE 2.5 MG PO TABS: 2.5000 mg | ORAL_TABLET | ORAL | 1 refills | Status: DC

## 2019-07-19 MED ORDER — DILTIAZEM HCL ER COATED BEADS 240 MG PO CP24: 480.0000 mg | ORAL_CAPSULE | Freq: Every day | ORAL | 1 refills | Status: DC

## 2019-07-19 NOTE — Patient Instructions (Signed)
[name],  It was a pleasure to see you today. Thank you for coming in.   Today we discussed your heart failure. I am glad that you are feeling better but it looks like you have more fluid on you today. Please start taking metolazone twice a week. Continue checking your weights are home. Try to limit your fluid intake to less then 2L per day and your sodium intake to less then 2 grams per day. I have placed a referral for the portable oxygen.   We also discussed your blood pressure. Please continue taking your current medications.  You received the pneumonia vaccine today. Please consider getting the COVID vaccine.   Please return to clinic in 2-3 weeks or sooner if needed.   Thank you again for coming in.   [name]

## 2019-07-19 NOTE — Progress Notes (Signed)
SATURATION QUALIFICATIONS: (This note is used to comply with regulatory documentation for home oxygen)  Patient Saturations on Room air at Rest = 89%  Patient Saturations on Room Air while Ambulating = 86%  Patient Saturations on 3 Liters of oxygen while Ambulating = 95%  Please briefly explain why patient needs home oxygen: see office note

## 2019-07-19 NOTE — Telephone Encounter (Signed)
Notified [facility] that order has been placed for POC. They requested order and OV note be faxed to them at [phone]. They explained that 3 L will not last long on portable oxygen.

## 2019-07-19 NOTE — Progress Notes (Signed)
   CC: Heart failure with reduced ejection fraction, hypertension  HPI:  Frank Morales is a 59 y.o.   Patient was admitted from 4/16-4/23 for acute on chronic diastolic heart failure and a right pleural effusion.  TTE showed an EF of 55 to 60%.  He was noted to be hypovolemic on exam and was diuresed with IV Lasix.  Weight on day of discharge was 346 pounds.  Discharged on Lasix 80 mg twice daily.  He reports that his breathing has been doing okay, it is up and down, it is worse with exertion.  He currently is on 3 L of oxygen, normally goes down to 2 L and wears 2 L at night.  He reports some swelling in his lower extremities however feels that it improves when he keeps them elevated.  Denies any issues with urination.  Denies any orthopnea.  Does endorse drinking a bottle of fluids, however is trying to limit this.  He reports limiting his salt intake.  Past Medical History:  Diagnosis Date  . Atrial fibrillation and flutter ([place]) 08/17/2015   A. S/p failed DCCV // b. Severe BAE on echo >> rate control strategy (has seen AF clinic) // c. Xarelto for anticoag (CHADS2-VASc=2 / CHF, HTN)  . Chronic diastolic CHF (congestive heart failure) ([place]) 08/30/2015   A. Echo 6/17: Apical HK, moderate focal basal and mild concentric LVH, EF 50-55, diffuse HK, trivial MR, severe BAE, mild TR, PASP 37  . Dependence on continuous supplemental oxygen    3L  . Hepatitis C, chronic ([place]) 08/19/2015  . History of cardiac catheterization    a. LHC 6/17: LAD irregs, o/w no CAD  . Hypertension   . OSA (obstructive sleep apnea) 11/21/2015   No Cpap  . Sleep apnea   . Tobacco abuse    Review of Systems: Reports dyspnea on exertion and lower extremity swelling.  Denies fevers, chills, nausea, vomiting, headaches, lightheadedness, dizziness, orthopnea, PND, or other issues.  Physical Exam:  Vitals:   07/19/19 1449  BP: 113/61  Pulse: 100  Temp: 99 F (37.2 C)  TempSrc: Oral  SpO2: 100%  Weight: (!) 353  lb 1.6 oz (160.2 kg)   General: alert, cooperative and no distress HEENT: PERRLA and extra ocular movement intact Heart: S1, S2 normal, no murmur, rub or gallop, regular rate and rhythm Lungs: clear to auscultation, no wheezes or rales and unlabored breathing Abdomen: abdomen is soft without significant tenderness, masses, organomegaly or guarding Extremities: 2+ bilateral lower extremity edema up to knee Psychiatry: Normal mood and affect   Assessment & Plan:   See Encounters Tab for problem based charting.  Patient discussed with Dr. Heber Volga

## 2019-07-20 DIAGNOSIS — Z0000 Encounter for general adult medical examination without abnormal findings: Secondary | ICD-10-CM | POA: Insufficient documentation

## 2019-07-20 LAB — BMP8+ANION GAP
Anion Gap: 13 mmol/L (ref 10.0–18.0)
BUN/Creatinine Ratio: 17 (ref 9–20)
BUN: 9 mg/dL (ref 6–24)
CO2: 29 mmol/L (ref 20–29)
Calcium: 8.9 mg/dL (ref 8.7–10.2)
Chloride: 95 mmol/L — ABNORMAL LOW (ref 96–106)
Creatinine, Ser: 0.53 mg/dL — ABNORMAL LOW (ref 0.76–1.27)
GFR calc Af Amer: 136 mL/min/{1.73_m2} (ref >59)
GFR calc non Af Amer: 117 mL/min/{1.73_m2} (ref >59)
Glucose: 87 mg/dL (ref 65–99)
Potassium: 4.3 mmol/L (ref 3.5–5.2)
Sodium: 137 mmol/L (ref 134–144)

## 2019-07-20 NOTE — Assessment & Plan Note (Signed)
Patient received pneumococcal vaccine given his heart failure status.

## 2019-07-20 NOTE — Telephone Encounter (Signed)
Order for POC and yesterday's OV note faxed to [facility] at [phone]. Fax confirmation receipt received. Hubbard Hartshorn, BSN, RN-BC

## 2019-07-20 NOTE — Assessment & Plan Note (Signed)
Patient was admitted from 4/16-4/23 for acute on chronic diastolic heart failure and a right pleural effusion.  TTE showed an EF of 55 to 60%.  He was noted to be hypovolemic on exam and was diuresed with IV Lasix.  Weight on day of discharge was 346 pounds.  Discharged on Lasix 80 mg twice daily.  He reports that his breathing has been doing okay, it is up and down, it is worse with exertion.  He currently is on 3 L of oxygen, normally goes down to 2 L and wears 2 L at night.  He reports some swelling in his lower extremities however feels that it improves when he keeps them elevated.  Denies any issues with urination.  Denies any orthopnea.  Denies fevers, chills, nausea, vomiting, chest pain, headaches, lightheadedness, or dizziness.  Does endorse drinking a bottle of fluids, however is trying to limit this.  He reports limiting his salt intake.  On exam his lungs are CTA BL, does have 2+ bilateral lower extremity edema up to the knee, no JVD.  Weight today is 353 pounds.  Vitals are stable and oxygen saturation is 100%.  Pulse ox checked while ambulating and dropped to 86%, improved to 95% with 3 L.  Patient does appear to have mild fluid overload, however since he is hemodynamically stable and asymptomatic we will increase his diuretics and follow-up closely in clinic.  -Continue Lasix 80 mg twice daily -Add metolazone 2.5 mg twice a week -Advised to limit sodium intake to 2 g/day and fluid intake -Continue to monitor weights at home -Check BMP -DME order for portable oxygen -Follow-up in 2 weeks

## 2019-07-20 NOTE — Assessment & Plan Note (Signed)
Patient is currently on Lasix 80 mg twice daily and diltiazem 480 mg daily.  Blood pressure today is well controlled at 113/61.  Denies any issues taking his medications.  No changes at this time.

## 2019-07-21 ENCOUNTER — Other Ambulatory Visit: Payer: Self-pay | Admitting: *Deleted

## 2019-07-21 DIAGNOSIS — I5032 Chronic diastolic (congestive) heart failure: Secondary | ICD-10-CM

## 2019-07-21 MED ORDER — FUROSEMIDE 80 MG PO TABS: 80.0000 mg | ORAL_TABLET | Freq: Two times a day (BID) | ORAL | 1 refills | Status: DC

## 2019-07-21 NOTE — Telephone Encounter (Signed)
Community care nurse calls to check on visit 5/4 and new orders, went over visit and sent refill request

## 2019-07-23 NOTE — Progress Notes (Signed)
Internal Medicine Clinic Attending  Case discussed with Dr. Krienke at the time of the visit.  We reviewed the resident's history and exam and pertinent patient test results.  I agree with the assessment, diagnosis, and plan of care documented in the resident's note.    

## 2019-08-02 ENCOUNTER — Other Ambulatory Visit: Payer: Self-pay | Admitting: Internal Medicine

## 2019-08-02 DIAGNOSIS — I5033 Acute on chronic diastolic (congestive) heart failure: Secondary | ICD-10-CM

## 2019-08-02 DIAGNOSIS — I4892 Unspecified atrial flutter: Secondary | ICD-10-CM

## 2019-08-07 DIAGNOSIS — I509 Heart failure, unspecified: Secondary | ICD-10-CM | POA: Diagnosis not present

## 2019-08-07 DIAGNOSIS — I5032 Chronic diastolic (congestive) heart failure: Secondary | ICD-10-CM | POA: Diagnosis not present

## 2019-08-17 DIAGNOSIS — I5032 Chronic diastolic (congestive) heart failure: Secondary | ICD-10-CM | POA: Diagnosis not present

## 2019-08-17 DIAGNOSIS — I509 Heart failure, unspecified: Secondary | ICD-10-CM | POA: Diagnosis not present

## 2019-08-23 ENCOUNTER — Other Ambulatory Visit: Payer: Self-pay | Admitting: Internal Medicine

## 2019-08-23 ENCOUNTER — Other Ambulatory Visit: Payer: Self-pay

## 2019-08-23 ENCOUNTER — Encounter: Payer: Self-pay | Admitting: Internal Medicine

## 2019-08-23 ENCOUNTER — Ambulatory Visit: Payer: Medicaid Other | Admitting: Internal Medicine

## 2019-08-23 VITALS — BP 120/88 | HR 78 | Temp 98.2°F | Ht 71.0 in | Wt 333.5 lb

## 2019-08-23 DIAGNOSIS — F17210 Nicotine dependence, cigarettes, uncomplicated: Secondary | ICD-10-CM

## 2019-08-23 DIAGNOSIS — I5032 Chronic diastolic (congestive) heart failure: Secondary | ICD-10-CM | POA: Diagnosis not present

## 2019-08-23 DIAGNOSIS — I4821 Permanent atrial fibrillation: Secondary | ICD-10-CM | POA: Diagnosis not present

## 2019-08-23 DIAGNOSIS — I110 Hypertensive heart disease with heart failure: Secondary | ICD-10-CM | POA: Diagnosis present

## 2019-08-23 DIAGNOSIS — F17200 Nicotine dependence, unspecified, uncomplicated: Secondary | ICD-10-CM

## 2019-08-23 DIAGNOSIS — I10 Essential (primary) hypertension: Secondary | ICD-10-CM

## 2019-08-23 MED ORDER — METOLAZONE 2.5 MG PO TABS: 2.5000 mg | ORAL_TABLET | ORAL | 2 refills | Status: DC

## 2019-08-23 NOTE — Telephone Encounter (Signed)
Patient in clinic for [facility]. States he has not heard about POC. Call placed to [facility]. States patient is with their sister company, [facility]. Spoke with Jeanett Schlein at [facility]. States patient already has portable tanks. Any smaller tanks would run out quickly at 3 L per min. Jeanett Schlein will call patient to explain this to him. Hubbard Hartshorn, BSN, RN-BC

## 2019-08-23 NOTE — Patient Instructions (Addendum)
Mr. BRENEN BEIGEL,  It was a pleasure to see you today. Thank you for coming in.   Today we discussed your heart failure. I am glad that you are feeling better today! Your weight is down from your last appointment. Please continue taking the lasix and metolazone. Please continue monitoring your sodium and fluid intake and checking your weights. We will be checking some labs today to make sure your electrolytes are doing okay.   Please return to clinic in 2 months or sooner if needed.   Thank you again for coming in.   [name]

## 2019-08-23 NOTE — Progress Notes (Signed)
° °  CC: HFpEF, HTN, and smoking  HPI:  [name] is a 59 y.o. with history of HFpEF, permanent atrial fibrillation, OSA, and sinus congestion presenting for HFpEF, HTN, and smoking.  Past Medical History:  Diagnosis Date   Atrial fibrillation and flutter ([place]) 08/17/2015   A. S/p failed DCCV // b. Severe BAE on echo >> rate control strategy (has seen AF clinic) // c. Xarelto for anticoag (CHADS2-VASc=2 / CHF, HTN)   Chronic diastolic CHF (congestive heart failure) ([place]) 08/30/2015   A. Echo 6/17: Apical HK, moderate focal basal and mild concentric LVH, EF 50-55, diffuse HK, trivial MR, severe BAE, mild TR, PASP 37   Dependence on continuous supplemental oxygen    3L   Hepatitis C, chronic (HCC) 08/19/2015   History of cardiac catheterization    a. LHC 6/17: LAD irregs, o/w no CAD   Hypertension    OSA (obstructive sleep apnea) 11/21/2015   No Cpap   Sleep apnea    Tobacco abuse    Review of Systems:  Constitutional: Negative for chills and fever.  Respiratory: Positive for chronic shortness of breath.   Cardiovascular: Negative for chest pain, positive for chronic BL LE swelling Gastrointestinal: Negative for abdominal pain, nausea and vomiting.  Neurological: Negative for dizziness and headaches.   Physical Exam:  Vitals:   08/23/19 1350  BP: 120/88  Pulse: 78  Temp: 98.2 F (36.8 C)  TempSrc: Oral  SpO2: 100%  Weight: (!) 333 lb 8 oz (151.3 kg)  Height: 5\' 11"  (1.803 m)   Physical Exam  Constitutional: He is oriented to person, place, and time and well-developed, well-nourished, and in no distress.  On 3L [place]  HENT:  Head: Normocephalic and atraumatic.  Eyes: Pupils are equal, round, and reactive to light. Conjunctivae are normal.  Cardiovascular: Normal rate.  Irregular rhythm  Pulmonary/Chest: Effort normal and breath sounds normal. No respiratory distress. He has no wheezes. He has no rales.  Abdominal: Soft. Bowel sounds are normal. He exhibits no  distension. There is no abdominal tenderness.  Musculoskeletal:        General: Edema (1-2+ BL LE edema) present.  Neurological: He is alert and oriented to person, place, and time.  Skin: Skin is warm and dry.  Psychiatric: Mood and affect normal.    Assessment & Plan:   See Encounters Tab for problem based charting.  Patient discussed with Dr. Daryll Drown

## 2019-08-24 ENCOUNTER — Telehealth: Payer: Self-pay | Admitting: Internal Medicine

## 2019-08-24 LAB — BMP8+ANION GAP
Anion Gap: 15 mmol/L (ref 10.0–18.0)
BUN/Creatinine Ratio: 23 — ABNORMAL HIGH (ref 9–20)
BUN: 17 mg/dL (ref 6–24)
CO2: 31 mmol/L — ABNORMAL HIGH (ref 20–29)
Calcium: 8.9 mg/dL (ref 8.7–10.2)
Chloride: 89 mmol/L — ABNORMAL LOW (ref 96–106)
Creatinine, Ser: 0.74 mg/dL — ABNORMAL LOW (ref 0.76–1.27)
GFR calc Af Amer: 118 mL/min/{1.73_m2} (ref >59)
GFR calc non Af Amer: 102 mL/min/{1.73_m2} (ref >59)
Glucose: 96 mg/dL (ref 65–99)
Potassium: 3.6 mmol/L (ref 3.5–5.2)
Sodium: 135 mmol/L (ref 134–144)

## 2019-08-24 NOTE — Assessment & Plan Note (Signed)
Patient has a history of hypertension and is currently on diltiazem and Lasix for his other medical conditions.  Blood pressure today is well controlled at 120/88.  No further changes at this time.

## 2019-08-24 NOTE — Assessment & Plan Note (Signed)
Patient reports that he is continuing to smoke, he smokes 2 to 3 cigarettes/day, he is down from about a pack per day.  He is trying to quit.  He will often smoke after he eats or drinks, he is trying to get some patches or gum to help him stop smoking. Discussed other options including medications to help him quit, he is not interested at this time.

## 2019-08-24 NOTE — Assessment & Plan Note (Signed)
Patient continues to be in atrial fibrillation, is rate controlled.  He is on Xarelto 20 mg daily and diltiazem 480 mg daily.  He denies any issues taking his medications. -Continue Xarelto 20 mg daily -Continue diltiazem 480 mg daily

## 2019-08-24 NOTE — Telephone Encounter (Signed)
Jeanett Schlein returned call. States she has POC order and OV notes from 07/19/2019. She will notify patient that POC is on back order but one will be delivered to him as soon as she receives one. Nothing further needed at this time. Hubbard Hartshorn, BSN, RN-BC

## 2019-08-24 NOTE — Telephone Encounter (Signed)
Returned call to patient. States he spoke with [facility] regarding POC. Requesting a call to [facility] to verify exact order that needs to be placed. Left message for Jeanett Schlein to return call. Hubbard Hartshorn, BSN, RN-BC

## 2019-08-24 NOTE — Telephone Encounter (Signed)
Pls contact pt regarding oxygen [phone]

## 2019-08-24 NOTE — Progress Notes (Signed)
Internal Medicine Clinic Attending  Case discussed with Dr. Krienke at the time of the visit.  We reviewed the resident's history and exam and pertinent patient test results.  I agree with the assessment, diagnosis, and plan of care documented in the resident's note.    

## 2019-08-24 NOTE — Assessment & Plan Note (Addendum)
Patient was noted to be volume overloaded on his last appointment, weight was up to 353 from 346 on day of discharge, he was recently admitted for heart failure exacerbation and right pleural effusion.  Metolazone was added to his Lasix regimen and with close follow-up.  Patient reports that he is feeling a little better than last time, reports that his breathing has improved.  He has been able to move around more and has been retaining quite a lot.  His weight today is down to 333, down from 353 last visit.  He has been checking his weights daily since his last visit, is around the 329-335 area.  He is still on 3 L nasal cannula however can go down to 2 to 2.5 L as needed.  He denies any fevers, chills, nausea, vomiting, chest pain, palpitations, worsening lower extremity swelling, or other symptoms.  On exam he does still look mildly fluid overloaded, he has 1-2+ bilateral lower extremity swelling, lungs are clear to auscultation, and appears to be breathing comfortably.  Overall he appears to be doing better from a heart failure standpoint, feels that the metolazone is working well for diuresis.  We will continue at the current dose and monitor labs.  -Continue Lasix 80 mg twice daily -Continue metolazone 2.5 mg twice a week -Continue K-Dur 60 mEq twice daily -Check BMP today -Return to clinic in 2 months to monitor volume status

## 2019-09-07 DIAGNOSIS — I5032 Chronic diastolic (congestive) heart failure: Secondary | ICD-10-CM | POA: Diagnosis not present

## 2019-09-07 DIAGNOSIS — I509 Heart failure, unspecified: Secondary | ICD-10-CM | POA: Diagnosis not present

## 2019-09-16 DIAGNOSIS — I509 Heart failure, unspecified: Secondary | ICD-10-CM | POA: Diagnosis not present

## 2019-09-16 DIAGNOSIS — I5032 Chronic diastolic (congestive) heart failure: Secondary | ICD-10-CM | POA: Diagnosis not present

## 2019-09-21 ENCOUNTER — Telehealth: Payer: Self-pay | Admitting: Internal Medicine

## 2019-09-21 NOTE — Telephone Encounter (Signed)
Pls contact pt regarding oxygen [phone]

## 2019-09-21 NOTE — Telephone Encounter (Signed)
Mikey Bussing, [place], [place], [place] Yes I saw that one, Mr. Eastridge didn't call Jeanett Schlein until 6/6 so that order has expired. But that order was correct if we can just get a new one put in just like that I will get that to her ASAP!   Mikey Bussing, [place], [place], [place] Oh and please add liter flow it was not on that last order       Previous Messages   ----- Message -----  From: Judge Stall, NT  Sent: 09/21/2019  1:59 PM EDT  To: Salome Arnt, Melba, NT, *  Subject: oxygen                      The order for the oxygen conserving device is in for 07-19-2019 would you take a look at it and see if this is correct [place], [place] C7/6/20212:02 PM

## 2019-09-22 ENCOUNTER — Other Ambulatory Visit: Payer: Self-pay | Admitting: Internal Medicine

## 2019-09-22 DIAGNOSIS — I5032 Chronic diastolic (congestive) heart failure: Secondary | ICD-10-CM

## 2019-09-22 NOTE — Telephone Encounter (Signed)
Resubmitted portable oxygen conserving order.

## 2019-10-07 DIAGNOSIS — I509 Heart failure, unspecified: Secondary | ICD-10-CM | POA: Diagnosis not present

## 2019-10-07 DIAGNOSIS — I5032 Chronic diastolic (congestive) heart failure: Secondary | ICD-10-CM | POA: Diagnosis not present

## 2019-10-07 NOTE — Telephone Encounter (Signed)
Please call pt back.

## 2019-10-08 NOTE — Telephone Encounter (Signed)
Patient called yesterday wanting to no if he was going to get  His concentor because it had been a month.I have sent a message to Frank Morales to see what if we can find out about his machine.

## 2019-10-20 NOTE — Progress Notes (Deleted)
   CC: ***  HPI:  [name] is a 59 y.o.   Past Medical History:  Diagnosis Date  . Atrial fibrillation and flutter ([place]) 08/17/2015   A. S/p failed DCCV // b. Severe BAE on echo >> rate control strategy (has seen AF clinic) // c. Xarelto for anticoag (CHADS2-VASc=2 / CHF, HTN)  . Chronic diastolic CHF (congestive heart failure) ([place]) 08/30/2015   A. Echo 6/17: Apical HK, moderate focal basal and mild concentric LVH, EF 50-55, diffuse HK, trivial MR, severe BAE, mild TR, PASP 37  . Dependence on continuous supplemental oxygen    3L  . Hepatitis C, chronic ([place]) 08/19/2015  . History of cardiac catheterization    a. LHC 6/17: LAD irregs, o/w no CAD  . Hypertension   . OSA (obstructive sleep apnea) 11/21/2015   No Cpap  . Sleep apnea   . Tobacco abuse    Review of Systems:  ***  Physical Exam:  There were no vitals filed for this visit. ***  Assessment & Plan:   See Encounters Tab for problem based charting.  Patient {GC/GE:[id number]::"discussed with","seen with"} Dr. {NAMES:[id number]::"Butcher","Guilloud","Hoffman","Mullen","Narendra","Raines","Vincent"}

## 2019-10-21 ENCOUNTER — Inpatient Hospital Stay (HOSPITAL_COMMUNITY)
Admission: EM | Admit: 2019-10-21 | Discharge: 2019-10-26 | DRG: 292 | Disposition: A | Discharge status: Home / Self Care | Payer: Medicaid Other | Attending: Internal Medicine | Admitting: Internal Medicine

## 2019-10-21 ENCOUNTER — Encounter (HOSPITAL_COMMUNITY): Payer: Self-pay | Admitting: Emergency Medicine

## 2019-10-21 ENCOUNTER — Emergency Department (HOSPITAL_COMMUNITY): Payer: Medicaid Other

## 2019-10-21 ENCOUNTER — Other Ambulatory Visit: Payer: Self-pay

## 2019-10-21 DIAGNOSIS — Z8249 Family history of ischemic heart disease and other diseases of the circulatory system: Secondary | ICD-10-CM

## 2019-10-21 DIAGNOSIS — J9611 Chronic respiratory failure with hypoxia: Secondary | ICD-10-CM | POA: Diagnosis present

## 2019-10-21 DIAGNOSIS — I4892 Unspecified atrial flutter: Secondary | ICD-10-CM | POA: Diagnosis present

## 2019-10-21 DIAGNOSIS — E871 Hypo-osmolality and hyponatremia: Secondary | ICD-10-CM | POA: Diagnosis present

## 2019-10-21 DIAGNOSIS — R0902 Hypoxemia: Secondary | ICD-10-CM | POA: Diagnosis not present

## 2019-10-21 DIAGNOSIS — M4722 Other spondylosis with radiculopathy, cervical region: Secondary | ICD-10-CM | POA: Diagnosis present

## 2019-10-21 DIAGNOSIS — F17210 Nicotine dependence, cigarettes, uncomplicated: Secondary | ICD-10-CM | POA: Diagnosis present

## 2019-10-21 DIAGNOSIS — Z833 Family history of diabetes mellitus: Secondary | ICD-10-CM

## 2019-10-21 DIAGNOSIS — M6281 Muscle weakness (generalized): Secondary | ICD-10-CM | POA: Diagnosis not present

## 2019-10-21 DIAGNOSIS — E739 Lactose intolerance, unspecified: Secondary | ICD-10-CM | POA: Diagnosis present

## 2019-10-21 DIAGNOSIS — Z79899 Other long term (current) drug therapy: Secondary | ICD-10-CM | POA: Diagnosis not present

## 2019-10-21 DIAGNOSIS — M79602 Pain in left arm: Secondary | ICD-10-CM

## 2019-10-21 DIAGNOSIS — I429 Cardiomyopathy, unspecified: Secondary | ICD-10-CM | POA: Diagnosis present

## 2019-10-21 DIAGNOSIS — I5033 Acute on chronic diastolic (congestive) heart failure: Secondary | ICD-10-CM | POA: Diagnosis present

## 2019-10-21 DIAGNOSIS — I509 Heart failure, unspecified: Secondary | ICD-10-CM

## 2019-10-21 DIAGNOSIS — Z7901 Long term (current) use of anticoagulants: Secondary | ICD-10-CM

## 2019-10-21 DIAGNOSIS — R531 Weakness: Secondary | ICD-10-CM | POA: Diagnosis not present

## 2019-10-21 DIAGNOSIS — I10 Essential (primary) hypertension: Secondary | ICD-10-CM | POA: Diagnosis present

## 2019-10-21 DIAGNOSIS — M2578 Osteophyte, vertebrae: Secondary | ICD-10-CM | POA: Diagnosis not present

## 2019-10-21 DIAGNOSIS — R29898 Other symptoms and signs involving the musculoskeletal system: Secondary | ICD-10-CM

## 2019-10-21 DIAGNOSIS — G4733 Obstructive sleep apnea (adult) (pediatric): Secondary | ICD-10-CM | POA: Diagnosis present

## 2019-10-21 DIAGNOSIS — E876 Hypokalemia: Secondary | ICD-10-CM | POA: Diagnosis not present

## 2019-10-21 DIAGNOSIS — M47812 Spondylosis without myelopathy or radiculopathy, cervical region: Secondary | ICD-10-CM | POA: Diagnosis not present

## 2019-10-21 DIAGNOSIS — R200 Anesthesia of skin: Secondary | ICD-10-CM

## 2019-10-21 DIAGNOSIS — R0689 Other abnormalities of breathing: Secondary | ICD-10-CM | POA: Diagnosis not present

## 2019-10-21 DIAGNOSIS — I4821 Permanent atrial fibrillation: Secondary | ICD-10-CM | POA: Diagnosis present

## 2019-10-21 DIAGNOSIS — J811 Chronic pulmonary edema: Secondary | ICD-10-CM | POA: Diagnosis not present

## 2019-10-21 DIAGNOSIS — Z9119 Patient's noncompliance with other medical treatment and regimen: Secondary | ICD-10-CM | POA: Diagnosis not present

## 2019-10-21 DIAGNOSIS — J90 Pleural effusion, not elsewhere classified: Secondary | ICD-10-CM | POA: Diagnosis not present

## 2019-10-21 DIAGNOSIS — R52 Pain, unspecified: Secondary | ICD-10-CM | POA: Diagnosis not present

## 2019-10-21 DIAGNOSIS — B182 Chronic viral hepatitis C: Secondary | ICD-10-CM | POA: Diagnosis present

## 2019-10-21 DIAGNOSIS — Z9981 Dependence on supplemental oxygen: Secondary | ICD-10-CM | POA: Diagnosis not present

## 2019-10-21 DIAGNOSIS — I110 Hypertensive heart disease with heart failure: Principal | ICD-10-CM | POA: Diagnosis present

## 2019-10-21 DIAGNOSIS — R0602 Shortness of breath: Secondary | ICD-10-CM | POA: Diagnosis not present

## 2019-10-21 DIAGNOSIS — I5043 Acute on chronic combined systolic (congestive) and diastolic (congestive) heart failure: Secondary | ICD-10-CM | POA: Diagnosis present

## 2019-10-21 DIAGNOSIS — Z20822 Contact with and (suspected) exposure to covid-19: Secondary | ICD-10-CM | POA: Diagnosis present

## 2019-10-21 DIAGNOSIS — R000 Tachycardia, unspecified: Secondary | ICD-10-CM | POA: Diagnosis not present

## 2019-10-21 DIAGNOSIS — J9811 Atelectasis: Secondary | ICD-10-CM | POA: Diagnosis not present

## 2019-10-21 LAB — BASIC METABOLIC PANEL
Anion gap: 9 (ref 5–15)
BUN: 9 mg/dL (ref 6–20)
CO2: 32 mmol/L (ref 22–32)
Calcium: 8.8 mg/dL — ABNORMAL LOW (ref 8.9–10.3)
Chloride: 92 mmol/L — ABNORMAL LOW (ref 98–111)
Creatinine, Ser: 0.58 mg/dL — ABNORMAL LOW (ref 0.61–1.24)
GFR calc Af Amer: >60 mL/min (ref >60)
GFR calc non Af Amer: >60 mL/min (ref >60)
Glucose, Bld: 115 mg/dL — ABNORMAL HIGH (ref 70–99)
Potassium: 4 mmol/L (ref 3.5–5.1)
Sodium: 133 mmol/L — ABNORMAL LOW (ref 135–145)

## 2019-10-21 LAB — URINALYSIS, ROUTINE W REFLEX MICROSCOPIC
Bilirubin Urine: NEGATIVE
Glucose, UA: NEGATIVE mg/dL
Ketones, ur: NEGATIVE mg/dL
Nitrite: NEGATIVE
Protein, ur: NEGATIVE mg/dL
Specific Gravity, Urine: 1.009 (ref 1.005–1.030)
pH: 7 (ref 5.0–8.0)

## 2019-10-21 LAB — HEPATIC FUNCTION PANEL
ALT: 19 U/L (ref 0–44)
AST: 20 U/L (ref 15–41)
Albumin: 3.2 g/dL — ABNORMAL LOW (ref 3.5–5.0)
Alkaline Phosphatase: 49 U/L (ref 38–126)
Bilirubin, Direct: 0.2 mg/dL (ref 0.0–0.2)
Indirect Bilirubin: 0.7 mg/dL (ref 0.3–0.9)
Total Bilirubin: 0.9 mg/dL (ref 0.3–1.2)
Total Protein: 6.9 g/dL (ref 6.5–8.1)

## 2019-10-21 LAB — MAGNESIUM: Magnesium: 2.1 mg/dL (ref 1.7–2.4)

## 2019-10-21 LAB — BRAIN NATRIURETIC PEPTIDE: B Natriuretic Peptide: 127.2 pg/mL — ABNORMAL HIGH (ref 0.0–100.0)

## 2019-10-21 LAB — CBC
HCT: 40.5 % (ref 39.0–52.0)
Hemoglobin: 12.9 g/dL — ABNORMAL LOW (ref 13.0–17.0)
MCH: 30.1 pg (ref 26.0–34.0)
MCHC: 31.9 g/dL (ref 30.0–36.0)
MCV: 94.4 fL (ref 80.0–100.0)
Platelets: 173 10*3/uL (ref 150–400)
RBC: 4.29 MIL/uL (ref 4.22–5.81)
RDW: 18 % — ABNORMAL HIGH (ref 11.5–15.5)
WBC: 9.1 10*3/uL (ref 4.0–10.5)
nRBC: 0 % (ref 0.0–0.2)

## 2019-10-21 LAB — PROTEIN / CREATININE RATIO, URINE
Creatinine, Urine: 55.7 mg/dL
Protein Creatinine Ratio: 0.14 mg/mg{Cre} (ref 0.00–0.15)
Total Protein, Urine: 8 mg/dL

## 2019-10-21 LAB — SARS CORONAVIRUS 2 BY RT PCR (HOSPITAL ORDER, PERFORMED IN ~~LOC~~ HOSPITAL LAB): SARS Coronavirus 2: NEGATIVE

## 2019-10-21 MED ORDER — POTASSIUM CHLORIDE CRYS ER 20 MEQ PO TBCR
30.0000 meq | EXTENDED_RELEASE_TABLET | Freq: Two times a day (BID) | ORAL | Status: DC
  Administered 2019-10-21 – 2019-10-22 (×4): 30 meq via ORAL
  Filled 2019-10-21: qty 1
  Filled 2019-10-21: qty 2
  Filled 2019-10-21 (×2): qty 1

## 2019-10-21 MED ORDER — FUROSEMIDE 10 MG/ML IJ SOLN
40.0000 mg | Freq: Once | INTRAMUSCULAR | Status: AC
  Administered 2019-10-21: 40 mg via INTRAVENOUS
  Filled 2019-10-21: qty 4

## 2019-10-21 MED ORDER — RIVAROXABAN 20 MG PO TABS
20.0000 mg | ORAL_TABLET | Freq: Every day | ORAL | Status: DC
  Administered 2019-10-21 – 2019-10-25 (×5): 20 mg via ORAL
  Filled 2019-10-21 (×5): qty 1

## 2019-10-21 MED ORDER — FUROSEMIDE 10 MG/ML IJ SOLN
80.0000 mg | Freq: Two times a day (BID) | INTRAMUSCULAR | Status: DC
  Administered 2019-10-22 – 2019-10-24 (×6): 80 mg via INTRAVENOUS
  Filled 2019-10-21 (×6): qty 8

## 2019-10-21 MED ORDER — ALBUTEROL SULFATE (2.5 MG/3ML) 0.083% IN NEBU
5.0000 mg | INHALATION_SOLUTION | Freq: Once | RESPIRATORY_TRACT | Status: AC
  Administered 2019-10-21: 5 mg via RESPIRATORY_TRACT
  Filled 2019-10-21: qty 6

## 2019-10-21 MED ORDER — FUROSEMIDE 10 MG/ML IJ SOLN: 60.0000 mg | Freq: Two times a day (BID) | INTRAMUSCULAR | Status: DC

## 2019-10-21 MED ORDER — SODIUM CHLORIDE 0.9 % IV SOLN: 250.0000 mL | INTRAVENOUS | Status: DC | PRN

## 2019-10-21 MED ORDER — ACETAMINOPHEN 325 MG PO TABS
650.0000 mg | ORAL_TABLET | ORAL | Status: DC | PRN
  Administered 2019-10-22: 650 mg via ORAL
  Filled 2019-10-21: qty 2

## 2019-10-21 MED ORDER — SODIUM CHLORIDE 0.9% FLUSH: 3.0000 mL | INTRAVENOUS | Status: DC | PRN

## 2019-10-21 MED ORDER — SODIUM CHLORIDE 0.9% FLUSH
3.0000 mL | Freq: Two times a day (BID) | INTRAVENOUS | Status: DC
  Administered 2019-10-22 – 2019-10-26 (×7): 3 mL via INTRAVENOUS

## 2019-10-21 MED ORDER — DILTIAZEM HCL ER COATED BEADS 180 MG PO CP24
180.0000 mg | ORAL_CAPSULE | Freq: Every day | ORAL | Status: DC
  Administered 2019-10-21 – 2019-10-26 (×6): 180 mg via ORAL
  Filled 2019-10-21 (×6): qty 1

## 2019-10-21 NOTE — Progress Notes (Signed)
Pt refused CPAP HS tonight. Patient states he does not wear one at home.

## 2019-10-21 NOTE — ED Notes (Signed)
Lunch Tray Ordered @ 1049. °

## 2019-10-21 NOTE — ED Provider Notes (Signed)
[place] EMERGENCY DEPARTMENT Provider Note   CSN: [id number] Arrival date & time: 10/21/19  0159     History Chief Complaint  Patient presents with  . Leg Swelling    Frank Morales is a 59 y.o. male.  59 y.o male with a PMH of Afib, HTN, Chronic HF presents to the ED with a chief of complaint of shortness of breath and groin swelling x 2 weeks. Patient reports shortness of breath which has been occurring for the past 1 to 2 weeks, he is originally on oxygen at home, on 3.5 L satting at 100%.  Will arrived in the ED with oxygen saturations in the 80s. He also endorses swelling to his groin area and bilateral leg swelling for the past 3 days.  He also endorses some weight gain, has gained 5 pounds in the past couple of days.  He reports compliant with his Lasix, currently on 80 mg twice a day.  Endorses no chest pain.  No fever, no other complaints.   The history is provided by the patient.       Past Medical History:  Diagnosis Date  . Atrial fibrillation and flutter ([place]) 08/17/2015   A. S/p failed DCCV // b. Severe BAE on echo >> rate control strategy (has seen AF clinic) // c. Xarelto for anticoag (CHADS2-VASc=2 / CHF, HTN)  . Chronic diastolic CHF (congestive heart failure) ([place]) 08/30/2015   A. Echo 6/17: Apical HK, moderate focal basal and mild concentric LVH, EF 50-55, diffuse HK, trivial MR, severe BAE, mild TR, PASP 37  . Dependence on continuous supplemental oxygen    3L  . Hepatitis C, chronic ([place]) 08/19/2015  . History of cardiac catheterization    a. LHC 6/17: LAD irregs, o/w no CAD  . Hypertension   . OSA (obstructive sleep apnea) 11/21/2015   No Cpap  . Sleep apnea   . Tobacco abuse     Patient Active Problem List   Diagnosis Date Noted  . Healthcare maintenance 07/20/2019  . Acute on chronic combined systolic and diastolic congestive heart failure ([place])   . Acute respiratory failure with hypoxia ([place]) 07/03/2019  . Pleural effusion on  right 03/14/2018  . Hyposmia 12/22/2017  . Tobacco abuse counseling   . Permanent atrial fibrillation   . Sinus congestion 03/03/2017  . Chronic dental pain 08/19/2016  . Grade I internal hemorrhoids   . 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 Contact dermatitis due to poison ivy 02/26/2016  . OSA (obstructive sleep apnea) 11/21/2015  . Chronic diastolic CHF (congestive heart failure) ([place]) 08/30/2015  . Hepatitis C, chronic ([place]) 08/19/2015  . Morbid obesity ([place]) 08/17/2015  . Hypertension 08/17/2015  . Orthopnea 08/17/2015  . Bilateral leg edema 08/17/2015  . Current smoker 08/17/2015    Past Surgical History:  Procedure Laterality Date  . CARDIAC CATHETERIZATION N/A 08/21/2015   Procedure: Right/Left Heart Cath and Coronary Angiography;  Surgeon: Leonie Man, MD;  Location: [facility];  Service: Cardiovascular;  Laterality: N/A;  . CARDIOVERSION N/A 09/22/2015   Procedure: CARDIOVERSION;  Surgeon: Josue Hector, MD;  Location: [facility] ENDOSCOPY;  Service: Cardiovascular;  Laterality: N/A;  . COLONOSCOPY N/A 07/19/2016   Procedure: COLONOSCOPY;  Surgeon: Doran Stabler, MD;  Location: [facility] ENDOSCOPY;  Service: Gastroenterology;  Laterality: N/A;  . ESOPHAGOGASTRODUODENOSCOPY N/A 07/19/2016   Procedure: ESOPHAGOGASTRODUODENOSCOPY (EGD);  Surgeon: Doran Stabler, MD;  Location: [facility] ENDOSCOPY;  Service: Gastroenterology;  Laterality: N/A;  . IR THORACENTESIS  ASP PLEURAL SPACE W/IMG GUIDE  07/09/2019  . NO PAST SURGERIES    . SINUS ENDO WITH FUSION N/A 10/02/2018   Procedure: SINUS ENDO WITH FUSION;  Surgeon: Melissa Montane, MD;  [facility] OR;  Service: ENT;  Laterality: N/A;       Family History  Problem Relation Age of Onset  . Hypertension Mother   . Diabetes Mother   . Pneumonia Father   . Hypertension Maternal Grandfather   . Colon cancer Neg Hx     Social History   Tobacco Use  . Smoking status: Current Every Day Smoker    Packs/day: 0.20    Types: Cigarettes  .  Smokeless tobacco: Never Used  . Tobacco comment: 2-3 per day   Vaping Use  . Vaping Use: Never used  Substance Use Topics  . Alcohol use: Yes    Alcohol/week: 5.0 standard drinks    Types: 5 Standard drinks or equivalent per week    Comment: 2 times a week beer  . Drug use: No    Home Medications Prior to Admission medications   Medication Sig Start Date End Date Taking? Authorizing Provider  azelastine (ASTELIN) 0.1 % nasal spray INSTILL 2 SPRAYS INTO BOTH NOSTRILS TWICE DAILY Patient taking differently: Place 2 sprays into both nostrils 2 (two) times daily.  06/22/19  Yes Sid Falcon, MD  diltiazem (CARDIZEM CD) 240 MG 24 hr capsule Take 2 capsules (480 mg total) by mouth daily. 07/19/19  Yes [name], MD  fluticasone [facility]) 50 MCG/ACT nasal spray Place 1 spray into both nostrils daily. 06/22/19  Yes Sid Falcon, MD  furosemide (LASIX) 80 MG tablet Take 1 tablet (80 mg total) by mouth 2 (two) times daily. 07/21/19  Yes [name], MD  loratadine (CLARITIN) 10 MG tablet TAKE 1 TABLET(10 MG) BY MOUTH DAILY Patient taking differently: Take 10 mg by mouth daily.  11/16/18  Yes Fay Records, MD  metolazone (ZAROXOLYN) 2.5 MG tablet TAKE 1 TABLET(2.5 MG) BY MOUTH 2 TIMES A WEEK Patient taking differently: Take 2.5 mg by mouth 2 (two) times a week. Monday and Friday 08/23/19  Yes [name], MD  potassium chloride (KLOR-CON) 10 MEQ tablet TAKE 6 TABLETS(60 MEQ) BY MOUTH TWICE DAILY Patient taking differently: Take 60 mEq by mouth 2 (two) times daily.  06/22/19  Yes Sid Falcon, MD  rivaroxaban (XARELTO) 20 MG TABS tablet Take 1 tablet (20 mg total) by mouth daily with supper. 08/03/19  Yes [name], MD    Allergies    Lactose intolerance (gi)  Review of Systems   Review of Systems  Constitutional: Negative for chills and fever.  HENT: Negative for sore throat.   Respiratory: Positive for shortness of breath.   Cardiovascular: Positive for leg  swelling (BL).  Gastrointestinal: Negative for abdominal pain, nausea and vomiting.  Genitourinary: Negative for flank pain.  Musculoskeletal: Negative for back pain.  Skin: Negative for pallor and wound.  Neurological: Negative for light-headedness and headaches.  All other systems reviewed and are negative.   Physical Exam Updated Vital Signs BP 126/81   Pulse 83   Temp 99.1 F (37.3 C) (Oral)   Resp (!) 25   SpO2 97%   Physical Exam Vitals and nursing note reviewed.  Constitutional:      Appearance: Normal appearance.  HENT:     Head: Normocephalic and atraumatic.     Nose: Nose normal.     Mouth/Throat:  Mouth: Mucous membranes are moist.  Eyes:     Pupils: Pupils are equal, round, and reactive to light.  Cardiovascular:     Rate and Rhythm: Normal rate.  Pulmonary:     Breath sounds: Rales present.  Abdominal:     General: Abdomen is flat. There is distension.     Palpations: Abdomen is soft.     Tenderness: There is no abdominal tenderness. There is no right CVA tenderness or left CVA tenderness.     Comments: Bowel sounds are present, abdomen appears distended. No TTP.   Musculoskeletal:     Cervical back: Normal range of motion and neck supple.     Right lower leg: 1+ Pitting Edema present.     Left lower leg: 1+ Pitting Edema present.  Skin:    General: Skin is warm and dry.  Neurological:     Mental Status: He is alert and oriented to person, place, and time.     ED Results / Procedures / Treatments   Labs (all labs ordered are listed, but only abnormal results are displayed) Labs Reviewed  CBC - Abnormal; Notable for the following components:      Result Value   Hemoglobin 12.9 (*)    RDW 18.0 (*)    All other components within normal limits  BASIC METABOLIC PANEL - Abnormal; Notable for the following components:   Sodium 133 (*)    Chloride 92 (*)    Glucose, Bld 115 (*)    Creatinine, Ser 0.58 (*)    Calcium 8.8 (*)    All other  components within normal limits  BRAIN NATRIURETIC PEPTIDE - Abnormal; Notable for the following components:   B Natriuretic Peptide 127.2 (*)    All other components within normal limits    EKG EKG Interpretation  Date/Time:  Thursday October 21 2019 02:11:51 EDT Ventricular Rate:  92 PR Interval:    QRS Duration: 108 QT Interval:  346 QTC Calculation: 427 R Axis:   140 Text Interpretation: Atrial fibrillation Right axis deviation Possible Right ventricular hypertrophy Abnormal ECG No significant change since last tracing Confirmed by Deno Etienne [phone]) on 10/21/2019 6:36:16 AM   Radiology DG Chest 2 View  Result Date: 10/21/2019 CLINICAL DATA:  Shortness of breath and generalized edema. EXAM: CHEST - 2 VIEW COMPARISON:  07/09/2019 FINDINGS: Shallow inspiration. Cardiac enlargement with pulmonary vascular congestion. Small bilateral pleural effusions. Atelectasis in the lung bases. No definite edema. IMPRESSION: Cardiac enlargement with pulmonary vascular congestion and small bilateral pleural effusions. Electronically Signed   By: Lucienne Capers M.D.   On: 10/21/2019 02:38    Procedures Procedures (including critical care time)  Medications Ordered in ED Medications  albuterol (PROVENTIL) (2.5 MG/3ML) 0.083% nebulizer solution 5 mg (5 mg Nebulization Given 10/21/19 0413)  furosemide (LASIX) injection 40 mg (40 mg Intravenous Given 10/21/19 3329)    ED Course  I have reviewed the triage vital signs and the nursing notes.  Pertinent labs & imaging results that were available during my care of the patient were reviewed by me and considered in my medical decision making (see chart for details).    MDM Rules/Calculators/A&P  Patient with a history of morbid obesity, chronic hypoxic respiratory failure on 3 L of supplemental oxygen at home presents to the ED with complaints of shortness of breath, abdominal distention, bilateral leg swelling for the past week.  According to patient he  has been compliant with his Lasix however he is noted a 5 pound weight  gain.  He is currently on 5 L of oxygen, satting at 93% via nasal cannula.  Extensive review of prior records, similar symptoms presented on April 2021, patient did have an increase in oxygen requirement on today's visit.  He is sitting upright, does have edema to bilateral legs along with crackles in his lungs.  No need for BiPAP at this time.  EKG showed atrial fibrillation.  He is currently on Xarelto.  No chest pain, lower suspicion for ACS versus PE.  Interpretation of his labs revealed a CBC without any leukocytosis, hemoglobin slightly decreased but stable.  BMP with mild hyponatremia, potassium stable.  Kidney function is at his baseline.  BNP slightly elevated at 127.  Patient does have signs of volume overload on exam, with abdominal distention, groin swelling, bilateral leg edema.  No calf tenderness, lower suspicion for DVT at this time, he is currently on Xarelto.  Chest xray showed:  Cardiac enlargement with pulmonary vascular congestion and small  bilateral pleural effusions.     Patient was given 40 mg of IV Lasix to help with diuresis.  We discussed likely requiring admission for further diuresis.  7:06 AM spoke to internal medicine service who will admit patient for further management of heart failure.  Portions of this note were generated with [employer]. Dictation errors may occur despite best attempts at proofreading.  Final Clinical Impression(s) / ED Diagnoses Final diagnoses:  Acute on chronic heart failure, unspecified heart failure type [facility])  Shortness of breath    Rx / DC Orders ED Discharge Orders    None       Janeece Fitting, PA-C 10/21/19 [place], DO 10/22/19 0263

## 2019-10-21 NOTE — ED Triage Notes (Signed)
Pt presents to ED BIB [facility]. Pt c/o SOB, and generalized edema. Pt reports compliant w/ meds, worsened x2d. Per Ems pt 83% on RA, rr - 30's, on 4L [place] - 96%.

## 2019-10-21 NOTE — H&P (Addendum)
Date: 10/21/2019               Patient Name:  Frank Morales MRN: [id number]  DOB: [birth year] Age / Sex: 59 y.o., male   PCP: [name], MD         Medical Service: Internal Medicine Teaching Service         Attending Physician: Dr. Jimmye Norman    First Contact: Dr. Shon Baton Pager: [phone]  Second Contact: Koleen Distance, DO, [place] Pager: Meriel Flavors [phone])       After Hours (After 5p/  First Contact Pager: [phone]  weekends / holidays): Second Contact Pager: [phone]   Chief Complaint: Shortness of breath  History of Present Illness: Frank Morales is a 59 year old African-American gentleman with medical history significant for HFpEF EF 55-60% (2021), permanent atrial fibrillation on Xarelto, hypertension, OSA, morbid obesity, chronic hepatitis C, right-sided pleural effusion, chronic respiratory failure on supplemental oxygen at home(3.5L for 1 year) presenting to the hospital with shortness of breath.  Frank Morales states that for the past 2 weeks he has been experiencing progressively worsening dyspnea.  In the past 3 days, he has gradually noticed bilateral scrotal swelling and abdominal distention.  He states that he has gained about 5 pounds in the past couple days even though he endorses compliance to all his diuretics.  His last weight at home was 338 pounds which was yesterday.  His diet is as follow: Breakfast (grits and eggs), lunch (boloney and hamburger), dinner (boiled chicken).  He endorses a low-salt diet consistently.  Otherwise he denies chest pain, dizziness, lightheadedness, abdominal pain, nausea, vomiting, palpitations, vision changes.  Patient had called EMS and upon EMS arrival he was found to be dyspneic, SPO2 of 83% on room air, tachypneic to 30s with SPO2 of 96% on 4 L nasal cannula.  EMS also discovered that patient had generalized edema.  Per chart review, he was admitted to the hospital from July 02, 2019 to July 09, 2019 for management of acute on  chronic diastolic heart failure with IV diuretics, he also underwent thoracentesis with about 800 cc of fluid removed due to right-sided pleural effusion.  Thought to be likely secondary to underlying heart failure and thus labs were not sent.  He also mentions over 6-7 week history of numbness and tingling in his left fourth and fifth digit which has been causing him some difficulties using his left arm.    Lab Orders     SARS Coronavirus 2 by RT PCR (hospital order, performed in [facility] hospital lab) Nasopharyngeal Nasopharyngeal Swab     CBC     Basic metabolic panel     Brain natriuretic peptide     Urinalysis, Routine w reflex microscopic     Protein / creatinine ratio, urine     Basic metabolic panel     Magnesium   Meds:  Current Meds  Medication Sig   azelastine (ASTELIN) 0.1 % nasal spray INSTILL 2 SPRAYS INTO BOTH NOSTRILS TWICE DAILY (Patient taking differently: Place 2 sprays into both nostrils 2 (two) times daily. )   diltiazem (CARDIZEM CD) 240 MG 24 hr capsule Take 2 capsules (480 mg total) by mouth daily.   fluticasone (FLONASE) 50 MCG/ACT nasal spray Place 1 spray into both nostrils daily.   furosemide (LASIX) 80 MG tablet Take 1 tablet (80 mg total) by mouth 2 (two) times daily.   loratadine (CLARITIN) 10 MG tablet TAKE 1 TABLET(10 MG) BY MOUTH DAILY (Patient taking differently: Take  10 mg by mouth daily. )   metolazone (ZAROXOLYN) 2.5 MG tablet TAKE 1 TABLET(2.5 MG) BY MOUTH 2 TIMES A WEEK (Patient taking differently: Take 2.5 mg by mouth 2 (two) times a week. Monday and Friday)   potassium chloride (KLOR-CON) 10 MEQ tablet TAKE 6 TABLETS(60 MEQ) BY MOUTH TWICE DAILY (Patient taking differently: Take 60 mEq by mouth 2 (two) times daily. )   rivaroxaban (XARELTO) 20 MG TABS tablet Take 1 tablet (20 mg total) by mouth daily with supper.     Allergies: Allergies as of 10/21/2019 - Review Complete 10/21/2019  Allergen Reaction Noted   Lactose intolerance  (gi) Nausea And Vomiting 07/16/2016   Past Medical History:  Diagnosis Date   Atrial fibrillation and flutter ([place]) 08/17/2015   A. S/p failed DCCV // b. Severe BAE on echo >> rate control strategy (has seen AF clinic) // c. Xarelto for anticoag (CHADS2-VASc=2 / CHF, HTN)   Chronic diastolic CHF (congestive heart failure) ([place]) 08/30/2015   A. Echo 6/17: Apical HK, moderate focal basal and mild concentric LVH, EF 50-55, diffuse HK, trivial MR, severe BAE, mild TR, PASP 37   Dependence on continuous supplemental oxygen    3L   Hepatitis C, chronic ([facility]) 08/19/2015   History of cardiac catheterization    a. LHC 6/17: LAD irregs, o/w no CAD   Hypertension    OSA (obstructive sleep apnea) 11/21/2015   No Cpap   Sleep apnea    Tobacco abuse     Family History: Mother with diabetes, multiple family members with diabetes and hypertension  Social History: Lives at home with his uncle, has been disabled for 4 years due to respiratory compromise.  He used to work at [employer] before going on disability.  He drinks 40 ounce beers 2 to 3 cans a day and has been doing so for 30 years.  Smokes 1 pack of cigarettes per week for about 20 years now.  Denies illicit drugs  Review of Systems: A complete ROS was negative except as per HPI.   Physical Exam: Blood pressure 126/81, pulse 83, temperature 99.1 F (37.3 C), temperature source Oral, resp. rate (!) 25, SpO2 97 %. Physical Exam Vitals and nursing note reviewed.  Constitutional:      General: Distressed: Moderate distress.     Appearance: He is obese. He is not toxic-appearing.  HENT:     Head: Normocephalic and atraumatic.     Mouth/Throat:     Mouth: Mucous membranes are moist.  Eyes:     General: No scleral icterus.       Right eye: No discharge.        Left eye: No discharge.     Conjunctiva/sclera: Conjunctivae normal.  Cardiovascular:     Rate and Rhythm: Normal rate. Rhythm irregular.     Heart sounds: Normal heart  sounds. No murmur heard.   Pulmonary:     Effort: No respiratory distress.     Breath sounds: Rales (Diffuse) present. No rhonchi. Wheezes: Intermittent.  Abdominal:     General: There is distension.     Palpations: Abdomen is soft.     Tenderness: There is no abdominal tenderness. There is no guarding.  Genitourinary:    Testes:        Right: Swelling present.        Left: Swelling present.  Musculoskeletal:     Cervical back: Neck supple.     Right lower leg: Edema (Trace) present.     Left  lower leg: Edema (Trace) present.  Skin:    General: Skin is warm.  Neurological:     Mental Status: He is alert.  Psychiatric:        Mood and Affect: Mood normal.     EKG: personally reviewed my interpretation is atrial fibrillation   CXR: personally reviewed my interpretation is pulmonary vascular congestion, small bilateral pleural effusions  Assessment & Plan by Problem: Active Problems:   Acute on chronic diastolic (congestive) heart failure [facility])   Mr. Huskins is a 59 year old African-American gentleman with medical history significant for HFpEF EF 55-60% (2021), permanent atrial fibrillation on Xarelto, hypertension, OSA, morbid obesity, chronic hepatitis C, right-sided pleural effusion, chronic respiratory failure on supplemental oxygen (3.5L for 1 year) here for management of acute on chronic diastolic heart failure   #Anasarca 2/2 acute on chronic diastolic heart failure exacerbation #?  Infiltrative cardiomyopathy BNP surprisingly not significantly elevated, 127 though artificially lower in patients with high BMI.  During his last admission, BNP was 78.  He reports of generalized swelling at the bilateral lower extremity as well as his scrotum.  At discharge in April, his weight was 346 pounds (157 kg).  His last known weight on August 23, 2019 was 333 pounds (151 kg).  Checks x-ray shows pulmonary vascular congestion with bilateral small pleural effusions.  Given his gross  anasarca as well as low voltage on EKGs, I will order UA and spot urine to protein ratio to evaluate for proteinuria.  I wonder if there is an underlying infiltrative cardiomyopathy as his echocardiogram reveals moderate concentric hypertrophy -Status post IV Lasix 40 mg in ED -Give additional 40 mg IV Lasix, then start Lasix 80 mg twice daily -Follow-up magnesium, potassium -Follow-up urinalysis, urine/protein ratio - Trend BMP - Strict I&Os - Daily Weights - Fluid restriction - Keep O2 sat >88 - Replenish K as needed >4.0 - Consider Nuclear Medicine Cardiac Scan to evaluate for infiltrative disease given concurrent numbness in L. 4th & 5th fingers   #Paresthesia of left fourth and fifth fingers (DDx carpal tunnel vs infiltrative disease) Reports over 6 to 7-week history of numbness in his left fourth and fifth fingers.  Negative Phalen and Tinel sign.  I do wonder if he has an underlying infiltrative disease such as amyloidosis -Follow-up LFTs to assess for protein gap.  If positive, can obtain SPEP, quantitative immunoglobulin, serum free light chains   #Hypertension -BP stable -Continue to monitor -Start Cardizem at lower dose 180 mg daily (takes 480 mg daily at home)   #Permanent atrial fibrillation -Continue Xarelto -Start Cardizem at lower dose 180 mg daily (takes 480 mg daily at home)   #Obstructive sleep apnea -CPAP nightly   #Alcohol use -On CIWA without Ativan  FEN: Fluid restriction, heart healthy diet VTE ppx: Xarelto CODE STATUS: Full code  Prior to Admission Living Arrangement: Home Anticipated Discharge Location: Pending Barriers to Discharge: Treatment of heart failure  Dispo: Admit patient to Inpatient with expected length of stay greater than 2 midnights.  Signed: Jean Rosenthal, MD 10/21/2019, 7:58 AM  Pager: [phone] Internal Medicine Teaching Service After 5pm on weekdays and 1pm on weekends: On Call pager: [phone]

## 2019-10-22 ENCOUNTER — Inpatient Hospital Stay (HOSPITAL_COMMUNITY): Payer: Medicaid Other

## 2019-10-22 ENCOUNTER — Encounter: Payer: Medicaid Other | Admitting: Internal Medicine

## 2019-10-22 DIAGNOSIS — I5043 Acute on chronic combined systolic (congestive) and diastolic (congestive) heart failure: Secondary | ICD-10-CM

## 2019-10-22 LAB — BASIC METABOLIC PANEL
Anion gap: 9 (ref 5–15)
BUN: 8 mg/dL (ref 6–20)
CO2: 36 mmol/L — ABNORMAL HIGH (ref 22–32)
Calcium: 8.9 mg/dL (ref 8.9–10.3)
Chloride: 92 mmol/L — ABNORMAL LOW (ref 98–111)
Creatinine, Ser: 0.63 mg/dL (ref 0.61–1.24)
GFR calc Af Amer: >60 mL/min (ref >60)
GFR calc non Af Amer: >60 mL/min (ref >60)
Glucose, Bld: 103 mg/dL — ABNORMAL HIGH (ref 70–99)
Potassium: 4.3 mmol/L (ref 3.5–5.1)
Sodium: 137 mmol/L (ref 135–145)

## 2019-10-22 LAB — MAGNESIUM: Magnesium: 2.2 mg/dL (ref 1.7–2.4)

## 2019-10-22 MED ORDER — FLUTICASONE PROPIONATE 50 MCG/ACT NA SUSP
1.0000 | Freq: Every day | NASAL | Status: DC
  Administered 2019-10-22 – 2019-10-26 (×5): 1 via NASAL
  Filled 2019-10-22: qty 16

## 2019-10-22 MED ORDER — METOLAZONE 5 MG PO TABS
5.0000 mg | ORAL_TABLET | Freq: Once | ORAL | Status: AC
  Administered 2019-10-22: 5 mg via ORAL
  Filled 2019-10-22: qty 1

## 2019-10-22 NOTE — Discharge Instructions (Signed)

## 2019-10-22 NOTE — Progress Notes (Signed)
Subjective:   Patient was admitted yesterday.  Overnight, he did not sleep well due to having to sleep sitting up and is now reporting left shoulder pain.   Evaluated at bedside this morning. He feels about the same compared to when he came in. He still has a lot of scrotal swelling which is painful. He is also frustrated with the meal portions and feels hungry.  He is concerned about his left shoulder pain and arm numbness which has been bothering him for several months and has now progressed to the point that it is difficult to bathe and dress himself.   Evaluated later in the afternoon at bedside. Reports significant improvement in his scrotal pain since provided with scrotal sling. States he hasn't had much shortness of breath above his baseline. Feels like his belly is less distended. He provides additional history regarding his reported left shoulder pain, left arm weakness, and left hand numbness. Reports he noticed the pain ~6-7 months ago, but in the last 3-4 weeks the pain has worsened and he has noticed weakness in the arm. He denies trauma to the area. States he usually sleeps on his right side. Has a history of severe left thumb laceration from >20 years ago and reports chronic paresthesias around the thumb from this. Has a scar on his left pec and onto his bicep, however this was from an injury >20 years ago and he does not have lingering symptoms or injury from it aside from the scar.   Objective:  Vital signs in last 24 hours: Vitals:   10/21/19 2050 10/21/19 2334 10/22/19 0415 10/22/19 0844  BP: 110/63 (!) 143/97 (!) 153/86 121/83  Pulse: 97 82    Resp: 20 20 20    Temp: 98.8 F (37.1 C) 99.2 F (37.3 C) 99.1 F (37.3 C)   TempSrc: Oral Oral Oral   SpO2: 99% 91% 95%   Weight:   (!) 151.7 kg    Physical Exam: Constitutional: Large gentleman sitting on the edge of bed, states, "I'm eating breakfast."  Cardio: Exam limited due to body habitus, though heart sounds appear  normal, no S3 appreciated. 2+ pitting edema at the ankles. Pulmonary: Bilateral wheezes throughout.  Abdominal: Significant distension, firm anteriorly but soft on the flank. GU: Scrotum elevated in scrotal sling MSK/neuro: No pain or limit in neck extension/flexion/rotation. No increased pain with axial loading in L extended lateral rotation of neck. On inspection of thorax, no obvious muscle or bone abnormalities. Pain with active flexion and extension of shoulders. 5/5 strength in deltoids and biceps. 4/5 strength in L tricep, wrist extension/flexion, finger adduction. Numbness in L 4th and 5th fingers. Negative Tinel and Phalen.   Assessment/Plan:  Principal Problem:   Acute on chronic combined systolic and diastolic congestive heart failure (HCC) Active Problems:   Hypertension   OSA (obstructive sleep apnea)   Permanent atrial fibrillation   Mr. Strawser is a 59 year old African-American gentleman with medical history significant for HFpEF EF 55-60% (2021), permanent atrial fibrillation on Xarelto, hypertension, OSA, morbid obesity, chronic hepatitis C, right-sided pleural effusion, chronic respiratory failure on supplemental oxygen (3.5L for 1 year) here for management of acute on chronic diastolic heart failure. This is hospital day #1.  Acute on chronic diastolic heart failure exacerbation Chronic Hypoxic Respiratory Failure  -diuresing well on IV lasix 80 mg BID; he is net negative 2.2 L. Unfortunately, we do not have a weight on admission to go off of. Today's weight 151.7 kg. -renal function and electrolytes  are stable; will continue potassium supplementation while diuresing  -we have added on metolazone 5 mg to further augment diuresis and hopefully shorten hospitalization time course  -strict I&Os; daily weights -he is currently saturating well on his home 2-3 L; Keep O2 sat >88  L upper extremity pain, weakness, and paresthesias Has progressed over the last few months and is  now affecting ADLs. Exam concerning for pain-limited L shoulder movement; weakness with arm flexion, arm extension, wrist flexion, wrist flexion; ulnar distribution of numbness in the L hand. Acute on chronic worsening and now associated weakness and numbness is concerning nerve impingement. Will obtain C-spine plain films first. - C-spine xray pending  Permanent atrial fibrillation - Continue Xarelto for primary prevention of CVA  - we have him on lower dose of Cardizem 180 mg daily (takes 480 mg daily at home) in the setting of diuresing for acute heart failure exacerbation   Hypertension -BP stable on cardizem and diuretics as above   OSA OHS -CPAP qhs, patient refusing  Alcohol use - On CIWA without Ativan - no signs of withdrawal    [name], MD 10/22/2019, 6:17 PM Pager: [phone] After 5pm on weekdays and 1pm on weekends: On Call pager [phone]

## 2019-10-22 NOTE — Progress Notes (Signed)
Pt refusing CPAP at this time.

## 2019-10-23 ENCOUNTER — Inpatient Hospital Stay (HOSPITAL_COMMUNITY): Payer: Medicaid Other

## 2019-10-23 LAB — BASIC METABOLIC PANEL
Anion gap: 12 (ref 5–15)
BUN: 9 mg/dL (ref 6–20)
CO2: 39 mmol/L — ABNORMAL HIGH (ref 22–32)
Calcium: 9 mg/dL (ref 8.9–10.3)
Chloride: 81 mmol/L — ABNORMAL LOW (ref 98–111)
Creatinine, Ser: 0.71 mg/dL (ref 0.61–1.24)
GFR calc Af Amer: >60 mL/min (ref >60)
GFR calc non Af Amer: >60 mL/min (ref >60)
Glucose, Bld: 127 mg/dL — ABNORMAL HIGH (ref 70–99)
Potassium: 3 mmol/L — ABNORMAL LOW (ref 3.5–5.1)
Sodium: 132 mmol/L — ABNORMAL LOW (ref 135–145)

## 2019-10-23 LAB — MAGNESIUM: Magnesium: 2.1 mg/dL (ref 1.7–2.4)

## 2019-10-23 MED ORDER — POTASSIUM CHLORIDE CRYS ER 20 MEQ PO TBCR
60.0000 meq | EXTENDED_RELEASE_TABLET | Freq: Two times a day (BID) | ORAL | Status: DC
  Administered 2019-10-23 (×2): 60 meq via ORAL
  Filled 2019-10-23 (×2): qty 3

## 2019-10-23 MED ORDER — METOLAZONE 5 MG PO TABS
5.0000 mg | ORAL_TABLET | Freq: Once | ORAL | Status: AC
  Administered 2019-10-23: 5 mg via ORAL
  Filled 2019-10-23: qty 1

## 2019-10-23 NOTE — Progress Notes (Signed)
   Subjective: No acute events overnight. Frank Morales is feeling improved today in terms of his breathing and swelling. Had a lot of urine output yesterday. Still having some scrotal soreness, but states the nystatin powder and scrotal sling are helping.   Objective:  Vital signs in last 24 hours: Vitals:   10/22/19 0415 10/22/19 0844 10/22/19 1956 10/23/19 0516  BP: (!) 153/86 121/83 120/77 129/81  Pulse:   72   Resp: 20  18 (!) 22  Temp: 99.1 F (37.3 C)  98 F (36.7 C) 98.4 F (36.9 C)  TempSrc: Oral  Oral Oral  SpO2: 95%  98%   Weight: (!) 151.7 kg   (!) 147.9 kg   General: obese gentleman sitting up in bed, breathing comfortably on 3L [place] Pulm: persistent scattered expiratory wheezing; no crackles appreciated Abd: protuberant abdomen that is less firm compared to yesterday  Ext: lower extremities with edema, but softer since admission   Assessment/Plan:  Principal Problem:   Acute on chronic combined systolic and diastolic congestive heart failure (HCC) Active Problems:   Hypertension   OSA (obstructive sleep apnea)   Permanent atrial fibrillation  Frank Morales is a 59 year old African-American gentleman with medical history significant for HFpEF EF 55-60% (2021), permanent atrial fibrillation on Xarelto, hypertension, OSA, morbid obesity, chronic hepatitis C, right-sided pleural effusion, chronic respiratory failure on supplemental oxygen(3.5L for 1 year)here for management of acute on chronic diastolic heart failure. This is hospital day #2.  Acute on chronic diastolic heart failure exacerbation Chronic Hypoxic Respiratory Failure  -diuresing well on IV lasix 80 mg BID; he is net negative 4.2 L and is down 4 kg from admission -tolerated dose of metolazone which certainly seems to be helping his diuresis; will repeat dosing again today -hypokalemic to 3.0 today so will increase potassium supplementation to 60 mEq BID; renal function remains stable -strict I&Os; daily  weights -he is currently saturating well on his home 2-3 L; Keep O2 sat >88  L upper extremity pain, weakness, and paresthesias Has progressed over the last few months and is now affecting ADLs. Exam concerning for pain-limited L shoulder movement; weakness with arm flexion, arm extension, wrist flexion, wrist flexion; ulnar distribution of numbness in the L hand. Acute on chronic worsening and now associated weakness and numbness is concerning nerve impingement.  -C-spine xray significant for disc height loss and foraminal narrowing most notably at C5-C6 -will try to obtain MRI while he is inpatient, but if scheduling doesn't allow will ensure to obtain as an outpatient in anticipation of neurosurgical referral   Permanent atrial fibrillation - Continue Xarelto for primary prevention of CVA  - we have him on lower dose of Cardizem 180mg  daily (takes 480 mg daily at home) in the setting of diuresing for acute heart failure exacerbation   Hypertension -BP stable on cardizem and diuretics as above   OSA OHS -non-compliant with CPAP and refuses while inpatient   Alcohol use - On CIWA without Ativan - no signs of withdrawal   Dispo: Anticipated discharge in approximately 1-2 day(s).   Modena Nunnery D, DO 10/23/2019, 11:03 AM Pager: [phone] After 5pm on weekdays and 1pm on weekends: On Call pager [phone]

## 2019-10-23 NOTE — Progress Notes (Signed)
Patient refused CPAP for the night. Patient wearing oxygen set at 2lpm  

## 2019-10-23 NOTE — Progress Notes (Signed)
Patient unable to lay flat for MRI. Notified on call internal medicine MD via phone.

## 2019-10-24 LAB — BASIC METABOLIC PANEL
Anion gap: 13 (ref 5–15)
Anion gap: 13 (ref 5–15)
BUN: 13 mg/dL (ref 6–20)
BUN: 16 mg/dL (ref 6–20)
CO2: 43 mmol/L — ABNORMAL HIGH (ref 22–32)
CO2: 44 mmol/L — ABNORMAL HIGH (ref 22–32)
Calcium: 9.3 mg/dL (ref 8.9–10.3)
Calcium: 9.4 mg/dL (ref 8.9–10.3)
Chloride: 75 mmol/L — ABNORMAL LOW (ref 98–111)
Chloride: 76 mmol/L — ABNORMAL LOW (ref 98–111)
Creatinine, Ser: 0.67 mg/dL (ref 0.61–1.24)
Creatinine, Ser: 0.75 mg/dL (ref 0.61–1.24)
GFR calc Af Amer: >60 mL/min (ref >60)
GFR calc Af Amer: >60 mL/min (ref >60)
GFR calc non Af Amer: >60 mL/min (ref >60)
GFR calc non Af Amer: >60 mL/min (ref >60)
Glucose, Bld: 116 mg/dL — ABNORMAL HIGH (ref 70–99)
Glucose, Bld: 128 mg/dL — ABNORMAL HIGH (ref 70–99)
Potassium: 2.7 mmol/L — CL (ref 3.5–5.1)
Potassium: 3.2 mmol/L — ABNORMAL LOW (ref 3.5–5.1)
Sodium: 131 mmol/L — ABNORMAL LOW (ref 135–145)
Sodium: 133 mmol/L — ABNORMAL LOW (ref 135–145)

## 2019-10-24 LAB — MAGNESIUM: Magnesium: 2.1 mg/dL (ref 1.7–2.4)

## 2019-10-24 MED ORDER — METOLAZONE 5 MG PO TABS
5.0000 mg | ORAL_TABLET | Freq: Once | ORAL | Status: AC
  Administered 2019-10-24: 5 mg via ORAL
  Filled 2019-10-24: qty 1

## 2019-10-24 MED ORDER — POTASSIUM CHLORIDE CRYS ER 20 MEQ PO TBCR
40.0000 meq | EXTENDED_RELEASE_TABLET | Freq: Once | ORAL | Status: AC
  Administered 2019-10-24: 40 meq via ORAL
  Filled 2019-10-24: qty 2

## 2019-10-24 MED ORDER — POTASSIUM CHLORIDE 20 MEQ PO PACK
40.0000 meq | PACK | Freq: Four times a day (QID) | ORAL | Status: DC
  Administered 2019-10-24 (×5): 40 meq via ORAL
  Filled 2019-10-24 (×7): qty 2

## 2019-10-24 NOTE — Progress Notes (Signed)
Pt  K 2.7.  Md text paged after lab called result.

## 2019-10-24 NOTE — Progress Notes (Signed)
Patient refused CPAP for tonight. RT instructed patient to have RT called if he changes his mind. RT will monitor as needed. 

## 2019-10-24 NOTE — Discharge Summary (Signed)
Name: DEFORREST BOGLE MRN: [id number] DOB: [birth year] 59 y.o. PCP: [name], MD  Date of Admission: 10/21/2019  2:08 AM Date of Discharge: 10/26/2019 Attending Physician: No att. providers found  Discharge Diagnoses:  1. Acute on chronic diastolic heart failure exacerbation in setting of chronic hypoxic respiratory failure  2. Left cervical radiculopathy   Discharge Medications: Allergies as of 10/26/2019      Reactions   Lactose Intolerance (gi) Nausea And Vomiting      Medication List    TAKE these medications   azelastine 0.1 % nasal spray Commonly known as: ASTELIN INSTILL 2 SPRAYS INTO BOTH NOSTRILS TWICE DAILY What changed:   how much to take  how to take this  when to take this  additional instructions   diltiazem 240 MG 24 hr capsule Commonly known as: CARDIZEM CD Take 2 capsules (480 mg total) by mouth daily.   fluticasone 50 MCG/ACT nasal spray Commonly known as: FLONASE Place 1 spray into both nostrils daily.   furosemide 80 MG tablet Commonly known as: LASIX Take 1 tablet (80 mg total) by mouth 2 (two) times daily.   loratadine 10 MG tablet Commonly known as: CLARITIN TAKE 1 TABLET(10 MG) BY MOUTH DAILY What changed: See the new instructions.   metolazone 2.5 MG tablet Commonly known as: ZAROXOLYN Take 1 tablet (2.5 mg total) by mouth 3 (three) times a week. What changed: See the new instructions.   potassium chloride 10 MEQ tablet Commonly known as: KLOR-CON Take 6 tablets (60 mEq total) by mouth 3 (three) times daily. TAKE 6 TABLETS(60 MEQ) BY MOUTH three times DAILY What changed:   how much to take  how to take this  when to take this  additional instructions   rivaroxaban 20 MG Tabs tablet Commonly known as: Xarelto Take 1 tablet (20 mg total) by mouth daily with supper.      Disposition and follow-up:   Mr.Maysin R Retana was discharged from [facility] in Good condition.  At the hospital  follow up visit please address:  1.    - HFpEF  - L cervical radiculopathy  - OSA: he reported he does not use CPAP at home and refused use while inpatient  2.  Labs / imaging needed at time of follow-up:   - BMP to check Na, K, and Cr - Magnesium - MRI Cervical Spine w/o contrast likely will need to be arranged by neurosurgeon. Patient was unable to tolerate procedure because he was unable to lay flat.  3.  Pending labs/ test needing follow-up:   None  Follow-up Appointments:   - Ambulatory referral to Neurosurgery placed at discharge  Hospital Course by problem list:  Acute on chronic diastolic heart failure exacerbation in the setting of Chronic Hypoxic Respiratory Failure With EMS, Mr. Klem was found to be tachypneic with increased oxygen requirement above his baseline of 3.5L. On admission, reported significant swelling and pain in his scrotum, abdomen, and bilateral lower extremities. Suspect secondary to dietary indiscretion. Scrotal sling during admission provided significant symptomatic relief. He was diuresed with IV Lasix and metolazone with significant improvement in his edema and associated scrotal pain. Potassium repleted as necessary with both PO and IV KCl. Net negative 17L during this admission. Day of discharge weight 143.2 kg, which was down 8.5 kg from his weight on admission of 151.7 kg (weight on admission was after some diuresis with Lasix). Discharged on original PO Lasix dose of 80 mg BID. Metolazone increased  to 2.5 mg 3x weekly (MWF). KCl increased to 60 mEq TID. Given instructions to follow-up in the [facility] on Thursday, 10/28/19. - Day of discharge (10/26/19) weight: 143.2 kg -POlasix 80 mg BID -2.5 mg metolazone MWF - KCl 60 mEq TID - Scrotal sling  Left cervical radiculopathy Mr. Bordon reports he noticed left shoulder pain ~6-7 months ago, but in the last 3-4 weeks the pain has worsened and he has noticed weakness in the arm. Also endorses left  4th and 5th finger paresthesias. No recent trauma to the area. Usually sleeps on his right side. Has history of severe left thumb laceration from >20 years ago associated with chronic paresthesias around the thumb from this. Has a scar on his left pec and onto his bicep, however this was from an injury >20 years ago and he does not have lingering symptoms or injury from it aside from the scar. Exam significant for: no pain or limit in neck extension/flexion/rotation. No increased pain with axial loading in L extended lateral rotation of neck. On inspection of thorax, no obvious muscle or bone abnormalities. Pain with active flexion and extension of L shoulder. 5/5 strength in deltoids and biceps. 4/5 strength in L tricep, wrist extension/flexion, finger adduction. Numbness in L 4th and 5th fingers. Negative Tinel and Phalen. C-spine xray significant for disc height loss and foraminal narrowing most notably at C5-C6. Patient unable to tolerate lying flat for cervical spine MRI.  - Discharged with referral to neurosurgery  Obstructive Sleep Apnea, Obesity Hypoventilation Syndrome Mr. Boulden reports he does not use CPAP at home. He did not want to use while inpatient.  Discharge Vitals:   BP 110/80 (BP Location: Right Arm)   Pulse 69   Temp 98.2 F (36.8 C) (Oral)   Resp 18   Wt (!) 143.2 kg   SpO2 98%   BMI 44.05 kg/m   Pertinent Labs, Studies, and Procedures:   Cervical spine Xray, complete: FINDINGS: There is no evidence of cervical spine fracture or prevertebral soft tissue swelling. There is straightening of the normal cervical lordosis. Disc height loss with anterior osteophytes are most notable at C5-C6 with moderate neural foraminal narrowing.  IMPRESSION: No acute fracture seen.  Cervical spine spondylosis most notable at C5-C6.  Discharge Instructions: Discharge Instructions    (HEART FAILURE PATIENTS) Call MD:  Anytime you have any of the following symptoms: 1) 3 pound  weight gain in 24 hours or 5 pounds in 1 week 2) shortness of breath, with or without a dry hacking cough 3) swelling in the hands, feet or stomach 4) if you have to sleep on extra pillows at night in order to breathe.   Complete by: As directed    Ambulatory referral to Neurosurgery   Complete by: As directed    Diet - low sodium heart healthy   Complete by: As directed    Discharge instructions   Complete by: As directed    Mr. Loescher, it was a pleasure taking care of you. You were treated for fluid overload from your heart failure which led to your belly, leg, and scrotal swelling. We treated you with fluid pills in order to draw this fluid out. We also replaced your potassium throughout this process. We would like you to follow-up in clinic this Thursday, 10/28/19, to see your primary care doctor and have labs drawn. Please continue taking your Lasix 80mg  twice daily. We would like you to increase the frequency of taking the metolazone from twice weekly to  three times weekly (Monday Wednesday Friday). We also ask that you increase your potassium to 60 mEq three times a day.      Signed: [name], MD 10/27/2019, 8:46 PM   Pager: [phone]

## 2019-10-24 NOTE — Progress Notes (Signed)
Subjective:   No acute events overnight.  Evaluated at bedside this morning. He thinks he has made good progress overall in terms of swelling in his abdomen, legs, and scrotum. However, his scrotum is still bothering him, and he normally does not have any swelling at baseline. Denies dysuria or abdominal pain.   He was unable to get the MRI done yesterday due to inability to lay flat. Even when he is feeling well, he is normally not able to lay flat.   Objective:  Vital signs in last 24 hours: Vitals:   10/23/19 1421 10/23/19 1423 10/23/19 1957 10/24/19 0357  BP: (!) 144/102 (!) 142/96 140/90 (!) 142/86  Pulse:   84 82  Resp:   18 20  Temp:   98.4 F (36.9 C) 98.4 F (36.9 C)  TempSrc:   Oral Oral  SpO2:   98%   Weight:    (!) 144.8 kg   Physical Exam: General: obese gentleman sitting up on the edge of bed gazing out the window, breathing comfortably on 3L [place] Pulm: Improved scattered expiratory wheezing; no crackles appreciated Abd: protuberant abdomen that is less firm compared to yesterday  Ext: lower extremities with edema, but softer since admission  GU: Scrotum is enlarged and appears tense, left side more so than the right, no obvious signs of infection  Assessment/Plan:  Principal Problem:   Acute on chronic combined systolic and diastolic congestive heart failure (HCC) Active Problems:   Hypertension   OSA (obstructive sleep apnea)   Permanent atrial fibrillation  Frank Morales is a 59 year old African-American gentleman with medical history significant for HFpEF EF 55-60% (2021), permanent atrial fibrillation on Xarelto, hypertension, OSA, morbid obesity, chronic hepatitis C, right-sided pleural effusion, chronic respiratory failure on supplemental oxygen(3.5L for 1 year)here for management of acute on chronic diastolic heart failure.This is hospital day #3.  Acute on chronic diastolic heart failure exacerbation Chronic Hypoxic Respiratory Failure Diuresing  well on IV lasix 80 mg BID in combination with metolazone; he is net negative 4.7 L over the last 24 hours, 10.8 L for this hospitalization. He is down 6.9 kg from admission. Still complaining of scrotal swelling, though it is much improved. Hypokalemic to 2.7 today (Mg 2.1) so will replete with 40 mEq now and further increase potassium supplementation to 40 mEq 4x daily; renal function remains stable.  Will likely plan to repeat metolazone once potassium replaced. - 40 mEq KCl now then 40 mEq 4x daily - Repeat BMP pending - Continue IV lasix 80 mg BID - Plan to repeat metolazone today once potassium repleted. - strict I&Os; daily weights; daily BMPs - Saturating well on his home 2-3 L;Keep O2 sat >88 - Scrotal sling   Left cervical radiculopathy C-spine xray significant for disc height loss and foraminal narrowing most notably at C5-C6. Unable to obtain MRI as patient unable to tolerate lying flat. Patient states he is unable to lay flat at his baseline.  - Will arrange for outpatient neurosurgical referral  Permanent atrial fibrillation -Continue Xareltofor primary prevention of CVA  - we have him on lower dose ofCardizem 180mg  daily (takes 480 mg daily at home)in the setting of diuresing for acute heart failure exacerbation  Hypertension - BP stableon cardizem and diuretics as above  OSA OHS - non-compliant with CPAP and refuses while inpatient   Alcohol use -On CIWA without Ativan - no signs of withdrawal  Dispo: Anticipated discharge in approximately 1-2 day(s).   Frank Lodge, MD 10/24/2019, 6:18 AM Pager:  [phone] After 5pm on weekdays and 1pm on weekends: On Call pager [phone]

## 2019-10-25 LAB — BASIC METABOLIC PANEL
Anion gap: 9 (ref 5–15)
Anion gap: 12 (ref 5–15)
Anion gap: 12 (ref 5–15)
BUN: 14 mg/dL (ref 6–20)
BUN: 14 mg/dL (ref 6–20)
BUN: 19 mg/dL (ref 6–20)
CO2: 46 mmol/L — ABNORMAL HIGH (ref 22–32)
CO2: 45 mmol/L — ABNORMAL HIGH (ref 22–32)
CO2: 45 mmol/L — ABNORMAL HIGH (ref 22–32)
Calcium: 9.2 mg/dL (ref 8.9–10.3)
Calcium: 9.4 mg/dL (ref 8.9–10.3)
Calcium: 9.5 mg/dL (ref 8.9–10.3)
Chloride: 77 mmol/L — ABNORMAL LOW (ref 98–111)
Chloride: 74 mmol/L — ABNORMAL LOW (ref 98–111)
Chloride: 72 mmol/L — ABNORMAL LOW (ref 98–111)
Creatinine, Ser: 0.64 mg/dL (ref 0.61–1.24)
Creatinine, Ser: 0.82 mg/dL (ref 0.61–1.24)
Creatinine, Ser: 0.73 mg/dL (ref 0.61–1.24)
GFR calc Af Amer: >60 mL/min (ref >60)
GFR calc Af Amer: >60 mL/min (ref >60)
GFR calc Af Amer: >60 mL/min (ref >60)
GFR calc non Af Amer: >60 mL/min (ref >60)
GFR calc non Af Amer: >60 mL/min (ref >60)
GFR calc non Af Amer: >60 mL/min (ref >60)
Glucose, Bld: 115 mg/dL — ABNORMAL HIGH (ref 70–99)
Glucose, Bld: 155 mg/dL — ABNORMAL HIGH (ref 70–99)
Glucose, Bld: 106 mg/dL — ABNORMAL HIGH (ref 70–99)
Potassium: 2.7 mmol/L — CL (ref 3.5–5.1)
Potassium: 2.9 mmol/L — ABNORMAL LOW (ref 3.5–5.1)
Potassium: 3.1 mmol/L — ABNORMAL LOW (ref 3.5–5.1)
Sodium: 132 mmol/L — ABNORMAL LOW (ref 135–145)
Sodium: 131 mmol/L — ABNORMAL LOW (ref 135–145)
Sodium: 129 mmol/L — ABNORMAL LOW (ref 135–145)

## 2019-10-25 LAB — MAGNESIUM: Magnesium: 2.1 mg/dL (ref 1.7–2.4)

## 2019-10-25 MED ORDER — POTASSIUM CHLORIDE CRYS ER 20 MEQ PO TBCR
40.0000 meq | EXTENDED_RELEASE_TABLET | ORAL | Status: AC
  Administered 2019-10-25 – 2019-10-26 (×2): 40 meq via ORAL
  Filled 2019-10-25: qty 2

## 2019-10-25 MED ORDER — FUROSEMIDE 80 MG PO TABS
80.0000 mg | ORAL_TABLET | Freq: Two times a day (BID) | ORAL | Status: DC
  Administered 2019-10-25 – 2019-10-26 (×3): 80 mg via ORAL
  Filled 2019-10-25 (×3): qty 1

## 2019-10-25 MED ORDER — METOLAZONE 5 MG PO TABS
5.0000 mg | ORAL_TABLET | Freq: Once | ORAL | Status: AC
  Administered 2019-10-25: 5 mg via ORAL
  Filled 2019-10-25: qty 1

## 2019-10-25 MED ORDER — POTASSIUM CHLORIDE CRYS ER 20 MEQ PO TBCR
40.0000 meq | EXTENDED_RELEASE_TABLET | ORAL | Status: DC
  Filled 2019-10-25: qty 2

## 2019-10-25 MED ORDER — POTASSIUM CHLORIDE 10 MEQ/100ML IV SOLN
10.0000 meq | INTRAVENOUS | Status: AC
  Administered 2019-10-25 (×6): 10 meq via INTRAVENOUS
  Filled 2019-10-25 (×6): qty 100

## 2019-10-25 MED ORDER — POTASSIUM CHLORIDE CRYS ER 20 MEQ PO TBCR
40.0000 meq | EXTENDED_RELEASE_TABLET | ORAL | Status: AC
  Administered 2019-10-25 (×3): 40 meq via ORAL
  Filled 2019-10-25 (×3): qty 2

## 2019-10-25 NOTE — Progress Notes (Signed)
Pt refusing cpap for the night. ?

## 2019-10-25 NOTE — Progress Notes (Signed)
   Subjective:   No acute events overnight. Evening potassium 3.2 for which patient was given an additional 40 mEq before his dose of metolazone. AM potassium 2.7, so repleted with 40 mEq q2 for 3 doses. Patient asymptomatic. Mg wnl.  Patient evaluated at bedside this morning. States he continues to feel better. States his scrotal, abdominal, and leg swelling are much improved. Patient is agreeable to plan to start PO lasix and increase metolazone from 2x to 3x weekly (MWF).  Objective:  Vital signs in last 24 hours: Vitals:   10/24/19 0907 10/24/19 1352 10/24/19 1908 10/25/19 0336  BP: 126/77 118/87 120/88 115/74  Pulse: 83 (!) 103 82 80  Resp: 15 20 20 18   Temp: 09.6 F (36.4 C)  97.9 F (36.6 C) 98.4 F (36.9 C)  TempSrc: Oral Oral Oral Oral  SpO2: 91% 95% 100% 98%  Weight:    (!) 143.7 kg   Physical Exam: General: obese gentleman sitting up on the edge of bed gazing out the window, breathing comfortably on 3L [place] Pulm: Good air movement, slightly diminished at bilateral lower lung bases, no crackles appreciated Abd: protuberant abdomen that is less firm compared to yesterday  Ext: lower extremities with edema, but softer since admission  Assessment/Plan:  Principal Problem:   Acute on chronic combined systolic and diastolic congestive heart failure (HCC) Active Problems:   Hypertension   OSA (obstructive sleep apnea)   Permanent atrial fibrillation  Frank Morales is a 59 year old African-American gentleman with medical history significant for HFpEF EF 55-60% (2021), permanent atrial fibrillation on Xarelto, hypertension, OSA, morbid obesity, chronic hepatitis C, right-sided pleural effusion, chronic respiratory failure on supplemental oxygen(3.5L for 1 year)here for management of acute on chronic diastolic heart failure.This is hospital day #4.  Acute on chronic diastolic heart failure exacerbation Chronic Hypoxic Respiratory Failure Diuresing well on IV lasix 80 mg  BID in combination with metolazone; he is net negative3.7L over the last 24 hours, 14.2 L for this hospitalization. He is down 8 kg from admission. Reports scrotal swelling is much improved, nearing his baseline. Feels he would benefit from using scrotal sling at home. Continuing to replete potassium as needed. Will resume his home PO lasix and increase metolazone frequency from 2x weekly to 3x weekly (MWF). Likely will increase his outpatient KCl supplementation at discharge.  - IV KCl per pharmacy - AM BMP - PO lasix 80 mg BID - 5 mg metolazone today (Monday), then MWF  - strict I&Os; daily weights; daily BMPs - Saturating well on his home 2-3 L;Keep O2 sat >88 - Scrotal sling   Left cervical radiculopathy C-spine xraysignificant for disc height loss and foraminal narrowing most notably at C5-C6. Unable to obtain MRI as patient unable to tolerate lying flat. Patient states he is unable to lay flat at his baseline.  - Will arrange for outpatient neurosurgical referral  Permanent atrial fibrillation -Continue Xareltofor primary prevention of CVA  - we have him on lower dose ofCardizem 180mg  daily (takes 480 mg daily at home)in the setting of diuresing for acute heart failure exacerbation  Hypertension - BP stableon cardizem and diuretics as above  OSA OHS - non-compliant with CPAP and refuses while inpatient  Alcohol use -On CIWA without Ativan - no signs of withdrawal  Dispo: Anticipated discharge in approximately1-2day(s).   [name], MD 10/25/2019, 6:14 AM Pager: [phone] After 5pm on weekdays and 1pm on weekends: On Call pager [phone]

## 2019-10-26 LAB — BASIC METABOLIC PANEL
Anion gap: 12 (ref 5–15)
BUN: 17 mg/dL (ref 6–20)
CO2: 45 mmol/L — ABNORMAL HIGH (ref 22–32)
Calcium: 9.7 mg/dL (ref 8.9–10.3)
Chloride: 74 mmol/L — ABNORMAL LOW (ref 98–111)
Creatinine, Ser: 0.66 mg/dL (ref 0.61–1.24)
GFR calc Af Amer: >60 mL/min (ref >60)
GFR calc non Af Amer: >60 mL/min (ref >60)
Glucose, Bld: 109 mg/dL — ABNORMAL HIGH (ref 70–99)
Potassium: 2.9 mmol/L — ABNORMAL LOW (ref 3.5–5.1)
Sodium: 131 mmol/L — ABNORMAL LOW (ref 135–145)

## 2019-10-26 LAB — MAGNESIUM: Magnesium: 2.1 mg/dL (ref 1.7–2.4)

## 2019-10-26 MED ORDER — POTASSIUM CHLORIDE 20 MEQ PO PACK
40.0000 meq | PACK | ORAL | Status: DC
  Filled 2019-10-26 (×2): qty 2

## 2019-10-26 MED ORDER — METOLAZONE 2.5 MG PO TABS: 2.5000 mg | ORAL_TABLET | ORAL | 0 refills | Status: DC

## 2019-10-26 MED ORDER — POTASSIUM CHLORIDE CRYS ER 20 MEQ PO TBCR
40.0000 meq | EXTENDED_RELEASE_TABLET | ORAL | Status: AC
  Administered 2019-10-26 (×2): 40 meq via ORAL
  Filled 2019-10-26 (×2): qty 2

## 2019-10-26 MED ORDER — POTASSIUM CHLORIDE 10 MEQ/100ML IV SOLN
10.0000 meq | INTRAVENOUS | Status: AC
  Administered 2019-10-26 (×6): 10 meq via INTRAVENOUS
  Filled 2019-10-26 (×5): qty 100

## 2019-10-26 MED ORDER — POTASSIUM CHLORIDE ER 10 MEQ PO TBCR: 60.0000 meq | EXTENDED_RELEASE_TABLET | Freq: Three times a day (TID) | ORAL | 0 refills | Status: DC

## 2019-10-26 NOTE — Progress Notes (Signed)
Patient uses oxygen via [place]. Patient states that he does not have time to wait for [facility] to deliver home oxygen to his room prior to discharge. Patient stating that he lives off of [place] and that he prefers to travel home with no oxygen rather than wait for oxygen delivery.

## 2019-10-26 NOTE — Progress Notes (Signed)
   Subjective:   No acute events overnight. Potassium repleted.  Patient evaluated at bedside this morning. States he is feeling better. Discussed the changes we are making to his medications. Emphasized the importance of him following up in clinic this Thursday. The patient voiced understanding.  Objective:  Vital signs in last 24 hours: Vitals:   10/25/19 1033 10/25/19 1945 10/26/19 0441 10/26/19 0442  BP: (!) 119/91 97/68 (!) 101/59 (!) 101/59  Pulse:  100  69  Resp:  19  18  Temp:  98.4 F (36.9 C)  98.2 F (36.8 C)  TempSrc:  Oral  Oral  SpO2:  97%  98%  Weight:    (!) 143.2 kg   Physical Exam:  General: obese gentleman sitting upon the edge ofbed gazing out the window, breathing comfortably on 2.5L [place] Pulm:Good air movement, slightly diminished at bilateral lower lung bases, no crackles appreciated Abd: protuberant abdomen that is less firm compared to yesterday  Ext: lower extremities with edema, but softer since admission  Assessment/Plan:  Principal Problem:   Acute on chronic combined systolic and diastolic congestive heart failure (HCC) Active Problems:   Hypertension   OSA (obstructive sleep apnea)   Permanent atrial fibrillation  Mr. Honea is a 59 year old African-American gentleman with medical history significant for HFpEF EF 55-60% (2021), permanent atrial fibrillation on Xarelto, hypertension, OSA, morbid obesity, chronic hepatitis C, right-sided pleural effusion, chronic respiratory failure on supplemental oxygen(3.5L for 1 year)here for management of acute on chronic diastolic heart failure.This is hospital day #5. He will discharge home today with close outpatient follow-up.  Acute on chronic diastolic heart failure exacerbation Chronic Hypoxic Respiratory Failure Transitioned to home regimen and potassium repleted as needed. He is net negative2.7Lover the last 24 hours, 17 L for this hospitalization. He is down8.5kg from admission (143.2  kg from 151.7 kg). Reports scrotal swelling is much improved. Feels he would benefit from using scrotal sling at home. Will discharge home on the following regimen with follow-up in clinic in two days.  - PO lasix 80 mg BID - 2.5 mg metolazone MWF  - KCl 60 mEq TID - Scrotal sling  Left cervical radiculopathy C-spine xraysignificant for disc height loss and foraminal narrowing most notably at C5-C6. Unable to obtain MRI as patient unable to tolerate lying flat.Patient states he is unable to lay flat at his baseline. - Outpatient neurosurgical referral placed  Permanent atrial fibrillation -Continue Xareltofor primary prevention of CVA  - Resume home Cardizem 480 mg daily at discharge   OSA OHS -non-compliant with CPAP and refuses while inpatient  Alcohol use -On CIWA without Ativan - no signs of withdrawal  Dispo: Discharge home today.  [name], MD 10/26/2019, 6:47 AM Pager: [phone] After 5pm on weekdays and 1pm on weekends: On Call pager [phone]

## 2019-10-27 ENCOUNTER — Telehealth: Payer: Self-pay | Admitting: *Deleted

## 2019-10-27 NOTE — Telephone Encounter (Signed)
Medicaid Managed Care team Transition of Care Assessment outreach attempt #1 made today. Unable to reach patient. HIPPA compliant voice message left requesting a return call. The patient has also been enrolled in an automated discharge follow up call series and will receive two outreach attempts for transition of care assessment. Contact information has been left for the patient and the [facility] Managed Care team is available to provide assistance to the patient at any time.    Lenor Coffin, RN, BSN, [place] Patient [place] [phone]

## 2019-10-27 NOTE — Telephone Encounter (Signed)
Pt called back. Transition of care assessment completed:  Transition Care Management Follow-up Telephone Call   [facility] Managed Care Transition Call Status:MM [facility] Call Made   Date of discharge and from where: [facility] 10/26/19   How have you been since you were released from the hospital? "felling ok"   Any questions or concerns? No  Items Reviewed:  Did the pt receive and understand the discharge instructions provided? Yes   Medications obtained and verified? Yes pt states new scripts were sent to pharmacy  Any new allergies since your discharge? No   Dietary orders reviewed? Yes   Do you have support at home?  Yes, family  Functional Questionnaire: (I = Independent and D = Dependent)  ADLs: Independent Bathing/Dressing:Independent Meal Prep: Independent Eating: Independent Maintaining continence: Independent Transferring/Ambulation: Independent Managing Meds: Independent Follow up appointments reviewed:  PCP Hospital f/u appt confirmed? Yes  Scheduled to see Dr Myrtie Hawk on  10/29/19 @0915   Specialist Hospital f/u appt confirmed? Referral to neurosurgery pending  Are transportation arrangements needed? No   If their condition worsens, is the pt aware to call PCP or go to the EmergencyDept.? yes Was the patient provided with contact information for the PCP's office or ED? yes  Called to verify 8/13/21verify appt on 10/29/19 at 0915 is with [facility]; spoke with [facility].  Was to pt encouraged to call back with questions or concerns? yes [facility], RN, BSN, [place] Patient [place] [phone]

## 2019-10-29 ENCOUNTER — Other Ambulatory Visit: Payer: Self-pay

## 2019-10-29 ENCOUNTER — Ambulatory Visit: Payer: Medicaid Other | Admitting: Internal Medicine

## 2019-10-29 ENCOUNTER — Encounter: Payer: Self-pay | Admitting: Internal Medicine

## 2019-10-29 VITALS — BP 123/82 | HR 94 | Temp 97.8°F | Ht 71.0 in | Wt 315.2 lb

## 2019-10-29 DIAGNOSIS — I5032 Chronic diastolic (congestive) heart failure: Secondary | ICD-10-CM | POA: Diagnosis not present

## 2019-10-29 DIAGNOSIS — M5412 Radiculopathy, cervical region: Secondary | ICD-10-CM | POA: Diagnosis not present

## 2019-10-29 DIAGNOSIS — R739 Hyperglycemia, unspecified: Secondary | ICD-10-CM

## 2019-10-29 DIAGNOSIS — I4821 Permanent atrial fibrillation: Secondary | ICD-10-CM | POA: Diagnosis not present

## 2019-10-29 LAB — POCT GLYCOSYLATED HEMOGLOBIN (HGB A1C): Hemoglobin A1C: 6 % — AB (ref 4.0–5.6)

## 2019-10-29 LAB — GLUCOSE, CAPILLARY: Glucose-Capillary: 113 mg/dL — ABNORMAL HIGH (ref 70–99)

## 2019-10-29 MED ORDER — POTASSIUM CHLORIDE ER 20 MEQ PO TBCR: 40.0000 meq | EXTENDED_RELEASE_TABLET | Freq: Three times a day (TID) | ORAL | 0 refills | Status: DC

## 2019-10-29 NOTE — Assessment & Plan Note (Addendum)
This is a chronic problem but with recent exacerbation. Patient is here at [facility] today for hospital follow up. He is stable but will need reassessment in 1 week:   Patient was hospitalized 10/21/2019 to 10/26/2019 for acute on chronic diastolic heart failure exacerbation. He was discharged with home dose of PO Lasix and increased dose of Metolazone. He reports compliance to Lasix 80 mg BID and Metolazone 2.5 mg 3 times a week and is also on K supplement 60 TID.  He reports some improvement since being discharged from hospital but states that still has SOB sometimes and using supplemental O2 at 3 li at rest and with ambulation.  He can not walk around that much because his O2 tank is very heavy and he has not received portable O2 concentrator yet.   Denies worsening of LEE or orthopnea. Scrotal swelling improved. He does not have CPAP at home and refused getting one because he can not sleep well with that.  On exam: he is wearing O2. He has mild bibasilar crackle on exam, and no evidence of significant volume overload. His weight is stable. (The same as dry weight at hospital discharge: 143 kg). Recent echo showed EF 55-60%. His uncontrolled sleep apnea might have a role in his (probable rt side) heart failure eaxerbation and volume overload. He has refused C PAP before. We extensively discussed the importance of controlling his OSA. He will think about that. He may have a component of OHS. He may take more benefit of BiPAP in that case. However, no daytime ABG available to confirm OHS.   Plan:  -Will continue current dose of diuretics: PO lasix 80 mg BID and PO Metolazone 2.5 mg three times a week. -Will reassess his volume status again next week to adjust his Metolazone dose if appropriate.  -Checking HbA1c today. If diagnosed with DM, will take benefit of SGLT-2 inhibitor for HFpEF (and we may stop Metolazone then) as well.  -Decreasing Kcl to 40 meq TID and checking BMP today. -F/u in clinic in a  week -Discussed importance of using C PAP in controlling his symptoms. He refuses for now but will think about that and will discuss it again next week. -Patient needs a portable oxygen concentrator tank.

## 2019-10-29 NOTE — Progress Notes (Deleted)
   CC: F/u of recent hospitalization  HPI:  Mr.Burney R Haye is a 59 y.o. male with HFpEF EF 55-60% (2021), permanent atrial fibrillation on Xarelto, hypertension, OSA, morbid obesity, chronic hepatitis C, right-sided pleural effusion, chronic respiratory failure on supplemental oxygen at home (3.5L for 1 year), came to the clinic today for follow-up of recent hospitalization for acute on chronic hypoxic respiratory failure and HFpEF exacerbation.   proximal respiratory  10/21/2019- 10/26/2019 for  Acute on chronic diastolic heart failure exacerbation in setting of chronic hypoxic respiratory failure  2. Left cervical radiculopathy    Still cant walk around that much because his O2 tank is veryheavy. If he has the O2 on, he is doing well. He sleeps with O2 . No CPAP. He is not intrested in having that. He can not stand up long withoput supplemental O2. He has not received the concentrator O2 tank yet.  weight: 143.2 kg. Today weight 143. POlasix 80 mg BID -2.5mg  metolazone MWF - KCl 60 mEq TID - Scrotal sling   Past Medical History:  Diagnosis Date  . Atrial fibrillation and flutter ([place]) 08/17/2015   A. S/p failed DCCV // b. Severe BAE on echo >> rate control strategy (has seen AF clinic) // c. Xarelto for anticoag (CHADS2-VASc=2 / CHF, HTN)  . Chronic diastolic CHF (congestive heart failure) ([place]) 08/30/2015   A. Echo 6/17: Apical HK, moderate focal basal and mild concentric LVH, EF 50-55, diffuse HK, trivial MR, severe BAE, mild TR, PASP 37  . Dependence on continuous supplemental oxygen    3L  . Hepatitis C, chronic ([place]) 08/19/2015  . History of cardiac catheterization    a. LHC 6/17: LAD irregs, o/w no CAD  . Hypertension   . OSA (obstructive sleep apnea) 11/21/2015   No Cpap  . Sleep apnea   . Tobacco abuse    Review of Systems:  ***  Physical Exam:  Vitals:   10/29/19 0913  BP: 123/82  Pulse: 94  Temp: 97.8 F (36.6 C)  TempSrc: Oral  SpO2: 100%  Weight: (!)  315 lb 3.2 oz (143 kg)  Height: 5\' 11"  (1.803 m)   ***  Assessment & Plan:   See Encounters Tab for problem based charting.  Patient {GC/GE:3044014::"discussed with","seen with"} Dr. {NAMES:[id number]::"Butcher","Guilloud","Hoffman","Mullen","Narendra","Raines","Vincent"}  Still cant walk around that much because his O2 tank is veryheavy. If he has the O2 on, he is doing well. He sleeps with O2 . No CPAP. He is not intrested in having that. He can not stand up long withoput supplemental O2. He has not received the concentrator O2 tank yet.  weight: 143.2 kg. Today weight 143. POlasix 80 mg BID -2.5mg  metolazone MWF - KCl 60 mEq TID - Scrotal swelling is better when using sling

## 2019-10-29 NOTE — Patient Instructions (Signed)
Thank you for allowing [place] to provide your care today.  Decrease potassium supplement to 40 meq (2 tablets of 20 meq) three times a day. Take rest of your medications as before and come back to clinic in 1 week. We will call you if we need to change your medications meanwhile.   I have ordered some blood work for you. I will call if any are abnormal.    Please come back to clinic in 1 week or earlier if your symptoms get worse or not improved. As always, if having severe symptoms, please seek medical attention at emergency room. Should you have any questions or concerns please call the internal medicine clinic at [phone].    Thank you!

## 2019-10-30 LAB — BMP8+ANION GAP
Anion Gap: 20 mmol/L — ABNORMAL HIGH (ref 10.0–18.0)
BUN/Creatinine Ratio: 33 — ABNORMAL HIGH (ref 9–20)
BUN: 26 mg/dL — ABNORMAL HIGH (ref 6–24)
CO2: 32 mmol/L — ABNORMAL HIGH (ref 20–29)
Calcium: 9.6 mg/dL (ref 8.7–10.2)
Chloride: 75 mmol/L — ABNORMAL LOW (ref 96–106)
Creatinine, Ser: 0.78 mg/dL (ref 0.76–1.27)
GFR calc Af Amer: 116 mL/min/{1.73_m2} (ref >59)
GFR calc non Af Amer: 100 mL/min/{1.73_m2} (ref >59)
Glucose: 106 mg/dL — ABNORMAL HIGH (ref 65–99)
Potassium: 3.5 mmol/L (ref 3.5–5.2)
Sodium: 127 mmol/L — ABNORMAL LOW (ref 134–144)

## 2019-10-30 NOTE — Progress Notes (Signed)
   CC: F/u of recent hospitalization  HPI:  Mr.Arend R Rocha is a 59 y.o. male with HFpEF EF 55-60% (2021), permanent atrial fibrillation on Xarelto, hypertension, OSA, morbid obesity, chronic hepatitis C, chronic respiratory failure on supplemental oxygen at home (3.5L for 1 year), came to the clinic today for follow-up of recent hospitalization. Please refer to problem based charting for further details and assessment and plan of current problem and chronic medical conditions.  Past Medical History:  Diagnosis Date  . Atrial fibrillation and flutter ([place]) 08/17/2015   A. S/p failed DCCV // b. Severe BAE on echo >> rate control strategy (has seen AF clinic) // c. Xarelto for anticoag (CHADS2-VASc=2 / CHF, HTN)  . Chronic diastolic CHF (congestive heart failure) ([place]) 08/30/2015   A. Echo 6/17: Apical HK, moderate focal basal and mild concentric LVH, EF 50-55, diffuse HK, trivial MR, severe BAE, mild TR, PASP 37  . Dependence on continuous supplemental oxygen    3L  . Hepatitis C, chronic ([place]) 08/19/2015  . History of cardiac catheterization    a. LHC 6/17: LAD irregs, o/w no CAD  . Hypertension   . OSA (obstructive sleep apnea) 11/21/2015   No Cpap  . Sleep apnea   . Tobacco abuse    Review of Systems: Negative except as mentioned in HPI and assessment and plan.  Physical Exam:  Vitals:   10/29/19 0913  BP: 123/82  Pulse: 94  Temp: 97.8 F (36.6 C)  TempSrc: Oral  SpO2: 100%  Weight: (!) 315 lb 3.2 oz (143 kg)  Height: 5\' 11"  (1.803 m)   Physical Exam Constitutional:      Appearance: He is obese. He is not ill-appearing.     Comments: Wearing supplemental nasal O2   HENT:     Head: Normocephalic and atraumatic.  Cardiovascular:     Rate and Rhythm: Normal rate.     Pulses: Normal pulses.     Heart sounds: Normal heart sounds. No murmur heard.   Pulmonary:     Effort: Pulmonary effort is normal.     Breath sounds: No stridor. Rales present. No wheezing.  Abdominal:      Palpations: Abdomen is soft.     Tenderness: There is no abdominal tenderness.  Musculoskeletal:     Comments: Trace bilateral LEE  Neurological:     Mental Status: He is alert and oriented to person, place, and time.  Psychiatric:        Mood and Affect: Mood normal.        Behavior: Behavior normal.        Thought Content: Thought content normal.        Judgment: Judgment normal.   \  Assessment & Plan:   See Encounters Tab for problem based charting.  Patient discussed with Dr. Heber [place]

## 2019-10-31 ENCOUNTER — Encounter: Payer: Self-pay | Admitting: Internal Medicine

## 2019-10-31 DIAGNOSIS — M5412 Radiculopathy, cervical region: Secondary | ICD-10-CM | POA: Insufficient documentation

## 2019-10-31 NOTE — Assessment & Plan Note (Signed)
Patient had symptoms suggestive of left cervical radiculopathy. He was not able to tolerate obtaining MRI while he was in hospital and referred to neurosurgery outpatient.  He mentions that he is doing ok and no worsening of pain or numbness of his fingers. Awaiting neurosurgery appointment.

## 2019-10-31 NOTE — Assessment & Plan Note (Signed)
Rate is controled. -Continue xarelto and Diltiazem.  Patient has a dental procedure coming. It is Ok to hold xarelto the night before the procedure +/- one more day if needed prior to procedure with high risk of bleeding. A letter provided for his dentist, per patient's request.

## 2019-11-02 ENCOUNTER — Telehealth: Payer: Self-pay | Admitting: *Deleted

## 2019-11-02 LAB — MAGNESIUM: Magnesium: 2.3 mg/dL (ref 1.6–2.3)

## 2019-11-02 LAB — SPECIMEN STATUS REPORT

## 2019-11-02 NOTE — Progress Notes (Signed)
Internal Medicine Clinic Attending  Case discussed with Dr. Masoudi  At the time of the visit.  We reviewed the resident's history and exam and pertinent patient test results.  I agree with the assessment, diagnosis, and plan of care documented in the resident's note.  

## 2019-11-02 NOTE — Telephone Encounter (Signed)
Pharm called for clarification of K+ script sent 8/13, clarified, pt has been taking what he had at home

## 2019-11-04 NOTE — Progress Notes (Signed)
   CC: Heart failure  HPI:  [name] is a 59 y.o. with a history of HFpEF, permanent atrial fibrillation on Xarelto, hypertension OSA, morbid obesity, chronic hepatitis C, chronic respiratory failure supplemental oxygen (3.5 L) presenting for follow-up of his heart failure.  Past Medical History:  Diagnosis Date  . Atrial fibrillation and flutter ([place]) 08/17/2015   A. S/p failed DCCV // b. Severe BAE on echo >> rate control strategy (has seen AF clinic) // c. Xarelto for anticoag (CHADS2-VASc=2 / CHF, HTN)  . Chronic diastolic CHF (congestive heart failure) ([place]) 08/30/2015   A. Echo 6/17: Apical HK, moderate focal basal and mild concentric LVH, EF 50-55, diffuse HK, trivial MR, severe BAE, mild TR, PASP 37  . Dependence on continuous supplemental oxygen    3L  . Hepatitis C, chronic ([place]) 08/19/2015  . History of cardiac catheterization    a. LHC 6/17: LAD irregs, o/w no CAD  . Hypertension   . OSA (obstructive sleep apnea) 11/21/2015   No Cpap  . Sleep apnea   . Tobacco abuse    Review of Systems:   Constitutional: Negative for chills and fever.  Respiratory: Negative for shortness of breath.   Cardiovascular: Negative for chest pain and leg swelling.  Gastrointestinal: Negative for abdominal pain, nausea and vomiting.  Neurological: Negative for dizziness and headaches.   Physical Exam: Vitals:   11/05/19 0954  BP: 120/72  Pulse: 66  SpO2: 96%  Weight: (!) 322 lb 11.2 oz (146.4 kg)   Physical Exam Constitutional:      Appearance: Normal appearance.  HENT:     Mouth/Throat:     Mouth: Mucous membranes are moist.  Cardiovascular:     Rate and Rhythm: Normal rate. Rhythm irregular.     Pulses: Normal pulses.     Heart sounds: Normal heart sounds.  Pulmonary:     Effort: Pulmonary effort is normal.     Breath sounds: Rales (Bibasilar) present.  Abdominal:     General: Abdomen is flat. Bowel sounds are normal.     Palpations: Abdomen is soft.    Musculoskeletal:        General: Swelling (1+ BL LE) present.  Skin:    General: Skin is warm and dry.     Capillary Refill: Capillary refill takes less than 2 seconds.  Neurological:     General: No focal deficit present.     Mental Status: He is alert and oriented to person, place, and time.  Psychiatric:        Mood and Affect: Mood normal.        Behavior: Behavior normal.      Assessment & Plan:   See Encounters Tab for problem based charting.  Patient discussed with Dr. Rebeca Alert

## 2019-11-05 ENCOUNTER — Ambulatory Visit (INDEPENDENT_AMBULATORY_CARE_PROVIDER_SITE_OTHER): Payer: Medicaid Other | Admitting: Internal Medicine

## 2019-11-05 DIAGNOSIS — G4733 Obstructive sleep apnea (adult) (pediatric): Secondary | ICD-10-CM | POA: Diagnosis not present

## 2019-11-05 DIAGNOSIS — I5032 Chronic diastolic (congestive) heart failure: Secondary | ICD-10-CM

## 2019-11-05 DIAGNOSIS — Z7901 Long term (current) use of anticoagulants: Secondary | ICD-10-CM

## 2019-11-05 DIAGNOSIS — I119 Hypertensive heart disease without heart failure: Secondary | ICD-10-CM | POA: Diagnosis not present

## 2019-11-05 DIAGNOSIS — J9610 Chronic respiratory failure, unspecified whether with hypoxia or hypercapnia: Secondary | ICD-10-CM

## 2019-11-05 DIAGNOSIS — I10 Essential (primary) hypertension: Secondary | ICD-10-CM

## 2019-11-05 DIAGNOSIS — Z9981 Dependence on supplemental oxygen: Secondary | ICD-10-CM

## 2019-11-05 DIAGNOSIS — I4821 Permanent atrial fibrillation: Secondary | ICD-10-CM | POA: Diagnosis not present

## 2019-11-05 DIAGNOSIS — I4891 Unspecified atrial fibrillation: Secondary | ICD-10-CM

## 2019-11-05 DIAGNOSIS — Z720 Tobacco use: Secondary | ICD-10-CM

## 2019-11-05 DIAGNOSIS — R0981 Nasal congestion: Secondary | ICD-10-CM

## 2019-11-05 DIAGNOSIS — I5033 Acute on chronic diastolic (congestive) heart failure: Secondary | ICD-10-CM

## 2019-11-05 MED ORDER — LORATADINE 10 MG PO TABS: ORAL_TABLET | ORAL | 3 refills | Status: DC

## 2019-11-05 MED ORDER — POTASSIUM CHLORIDE ER 20 MEQ PO TBCR: 40.0000 meq | EXTENDED_RELEASE_TABLET | Freq: Three times a day (TID) | ORAL | 0 refills | Status: DC

## 2019-11-05 MED ORDER — FUROSEMIDE 80 MG PO TABS: 80.0000 mg | ORAL_TABLET | Freq: Two times a day (BID) | ORAL | 1 refills | Status: DC

## 2019-11-05 MED ORDER — METOLAZONE 2.5 MG PO TABS: 2.5000 mg | ORAL_TABLET | ORAL | 1 refills | Status: DC

## 2019-11-05 MED ORDER — RIVAROXABAN 20 MG PO TABS: 20.0000 mg | ORAL_TABLET | Freq: Every day | ORAL | 1 refills | Status: DC

## 2019-11-05 MED ORDER — DILTIAZEM HCL ER COATED BEADS 240 MG PO CP24: 480.0000 mg | ORAL_CAPSULE | Freq: Every day | ORAL | 1 refills | Status: DC

## 2019-11-05 NOTE — Assessment & Plan Note (Signed)
Blood pressure well controlled today. No changes to current regimen.

## 2019-11-05 NOTE — Progress Notes (Signed)
Internal Medicine Clinic Attending  Case discussed with Dr. Krienke at the time of the visit.  We reviewed the resident's history and exam and pertinent patient test results.  I agree with the assessment, diagnosis, and plan of care documented in the resident's note.  Mikko Lewellen, M.D., Ph.D.  

## 2019-11-05 NOTE — Assessment & Plan Note (Signed)
Patient reports that he is been feeling better since his last visit, feels like his breathing has improved and that his swelling has come down.  He is currently on 2 L nasal cannula, wears about 2 to 3 L when he is at home.  Denies any orthopnea, or shortness of breath.  He reports that he has been eating as well, he has been celebrating his birthday, has been eating corn, cabbage, collard greens, meats, and not sticking to a fluid restriction.  He has stopped drinking sodas.  He has been taking the Lasix 80 mg twice a day, metolazone 2.5 mg 3 times a week, and potassium supplementation 40 mg 3 times daily.  He denies any issues taking his medications.  Does report urinating quite a lot, states that he is up most of the night urinating.  He has his portable oxygen machine now, states that he is able to move around more.  On exam he is noted to have an increased weight of 7 pounds since his last visit one 1 week ago.  He is currently on 2 L nasal cannula, does have bibasilar crackles, cardiac exam rate controlled atrial fibrillation, 1+ bilateral pitting edema.  Overall he appears to be doing well symptomatically however his increase in weight is very concerning.  Given his reported noncompliance to dietary and fluid restriction this could be what is contributing.  I am still concerned that this could be representative of fluid overload and that he could go into another heart failure exacerbation.  Advised to monitor his diet and fluid intake, will have to have close follow-up next week to make sure he does not symptomatically worsen.  His last BMP showed a normal potassium, will not make any changes to his current regimen however will repeat BMP today.  -Continue Lasix 80 mg daily -Continue metolazone 2.5 mg 3 times a week -Refill potassium 40 mg 3 times daily -Repeat BMP today -Follow-up in 1 week -Could consider spironolactone if blood pressure can tolerate, he is taking potassium supplementation

## 2019-11-05 NOTE — Telephone Encounter (Signed)
Patient in office today with new POC from [place]. He was very Patent attorney. Hubbard Hartshorn, BSN, RN-BC

## 2019-11-05 NOTE — Patient Instructions (Signed)
[name],  It was a pleasure to see you today. Thank you for coming in. Happy Birthday!!  Today we discussed your heart failure. I am glad that you are feeling well however your weight increase is concerning.  In regards to this please continue taking the lasix 80 mg twice a day and the metolazone three times per week. Please try to stick to a low sodium and low fluid diet. Please monitor your weight daily. Continue taking the potassium supplementation, we are checking some labs today and will contact you if they are abnormal.   Please return to clinic in 1 week or sooner if needed.   Thank you again for coming in. I hope that you have a great rest of your birthday!  [name]

## 2019-11-06 LAB — BMP8+ANION GAP
Anion Gap: 13 mmol/L (ref 10.0–18.0)
BUN/Creatinine Ratio: 20 (ref 9–20)
BUN: 11 mg/dL (ref 6–24)
CO2: 31 mmol/L — ABNORMAL HIGH (ref 20–29)
Calcium: 9.2 mg/dL (ref 8.7–10.2)
Chloride: 88 mmol/L — ABNORMAL LOW (ref 96–106)
Creatinine, Ser: 0.54 mg/dL — ABNORMAL LOW (ref 0.76–1.27)
GFR calc Af Amer: 134 mL/min/{1.73_m2} (ref >59)
GFR calc non Af Amer: 116 mL/min/{1.73_m2} (ref >59)
Glucose: 97 mg/dL (ref 65–99)
Potassium: 3.6 mmol/L (ref 3.5–5.2)
Sodium: 132 mmol/L — ABNORMAL LOW (ref 134–144)

## 2019-11-07 DIAGNOSIS — I5032 Chronic diastolic (congestive) heart failure: Secondary | ICD-10-CM | POA: Diagnosis not present

## 2019-11-07 DIAGNOSIS — I509 Heart failure, unspecified: Secondary | ICD-10-CM | POA: Diagnosis not present

## 2019-11-10 ENCOUNTER — Other Ambulatory Visit: Payer: Self-pay | Admitting: Internal Medicine

## 2019-11-10 NOTE — Telephone Encounter (Signed)
Spoke w/ pt and went over med instructions

## 2019-11-10 NOTE — Telephone Encounter (Signed)
Pls contact patient pharmacy regarding instructions or contact pt [phone]

## 2019-11-15 ENCOUNTER — Encounter: Payer: Self-pay | Admitting: Internal Medicine

## 2019-11-15 ENCOUNTER — Other Ambulatory Visit: Payer: Self-pay

## 2019-11-15 ENCOUNTER — Ambulatory Visit (INDEPENDENT_AMBULATORY_CARE_PROVIDER_SITE_OTHER): Payer: Medicaid Other | Admitting: Internal Medicine

## 2019-11-15 VITALS — BP 120/73 | HR 91 | Temp 98.1°F | Ht 71.0 in | Wt 317.5 lb

## 2019-11-15 DIAGNOSIS — Z7901 Long term (current) use of anticoagulants: Secondary | ICD-10-CM

## 2019-11-15 DIAGNOSIS — Z9981 Dependence on supplemental oxygen: Secondary | ICD-10-CM | POA: Diagnosis not present

## 2019-11-15 DIAGNOSIS — G4733 Obstructive sleep apnea (adult) (pediatric): Secondary | ICD-10-CM | POA: Diagnosis not present

## 2019-11-15 DIAGNOSIS — I5032 Chronic diastolic (congestive) heart failure: Secondary | ICD-10-CM | POA: Diagnosis not present

## 2019-11-15 DIAGNOSIS — I119 Hypertensive heart disease without heart failure: Secondary | ICD-10-CM

## 2019-11-15 DIAGNOSIS — J9610 Chronic respiratory failure, unspecified whether with hypoxia or hypercapnia: Secondary | ICD-10-CM

## 2019-11-15 DIAGNOSIS — I10 Essential (primary) hypertension: Secondary | ICD-10-CM

## 2019-11-15 DIAGNOSIS — I4821 Permanent atrial fibrillation: Secondary | ICD-10-CM | POA: Diagnosis not present

## 2019-11-15 DIAGNOSIS — Z720 Tobacco use: Secondary | ICD-10-CM | POA: Diagnosis not present

## 2019-11-15 MED ORDER — POTASSIUM CHLORIDE ER 20 MEQ PO TBCR: 40.0000 meq | EXTENDED_RELEASE_TABLET | Freq: Three times a day (TID) | ORAL | 1 refills | Status: DC

## 2019-11-15 NOTE — Assessment & Plan Note (Signed)
Patient is currently on Lasix 80 mg twice daily, metolazone 2.5 mg 3 times a week, and K-Dur 40 mg 3 times daily.  He is currently on 2 L nasal cannula and reports that his breathing has been doing better.  Feels like his breathing is improved from his last visit.  He is able to ambulate with no issues.  Feels that the lower extremity swelling has improved, currently using compression socks as needed.  His weight is down from his last visit from 322 pounds to 317 pounds.  On exam he appears to be doing better, no crackles noted on exam, still has 2+ bilateral lower extremity edema, and is on 2 L nasal cannula.  Overall his heart failure appears to be stable at this time.  His last BMP showed a potassium of 3.6.  We will repeat his BMP today.  He reported that his potassium prescription was only filled at 150 tablets instead of the 180 tablets that was sent in.  We will need to look into this.  -Continue Lasix 80 mg twice daily -Continue metolazone 2.5 mg 3 times a week -Continue K-Dur 40 mg twice daily -Repeat BMP today -Continue supplemental oxygen 2 L nasal cannula -RTC in 1 month to follow-up weight and oxygen status, could consider addition of spironolactone if blood pressure can tolerate, or consider decreasing metolazone

## 2019-11-15 NOTE — Patient Instructions (Signed)
[name],  It was a pleasure to see you today. Thank you for coming in.   Today we discussed your heart failure. I am glad that you are doing well! Your weight is down from your last visit and your breathing seems to be doing better. We are checking some labs to see what your potassium is and will contact you if there is any changes that need to be made.    We also discussed your blood pressure. This looks good today!   Please return to clinic in 1 month or sooner if needed.   Thank you again for coming in.   [name]

## 2019-11-15 NOTE — Assessment & Plan Note (Signed)
Patient currently on Lasix 80 mg twice daily, metolazone 2.5 mg 3 times a week, and diltiazem 480 mg daily.  Blood pressure today is well controlled at 120/73.  No changes at this time.

## 2019-11-15 NOTE — Progress Notes (Signed)
   CC: Hypertension, heart failure with preserved ejection fraction  HPI:  Mr.Atif R Burgo is a 59 y.o. with a history listed below including persistent atrial fibrillation, heart failure with preserved ejection fraction on 2 L nasal cannula, hypertension, OSA, and tobacco abuse presenting for follow-up for his hypertension and heart failure with preserved ejection fraction.  Past Medical History:  Diagnosis Date  . Atrial fibrillation and flutter ([place]) 08/17/2015   A. S/p failed DCCV // b. Severe BAE on echo >> rate control strategy (has seen AF clinic) // c. Xarelto for anticoag (CHADS2-VASc=2 / CHF, HTN)  . Chronic diastolic CHF (congestive heart failure) ([place]) 08/30/2015   A. Echo 6/17: Apical HK, moderate focal basal and mild concentric LVH, EF 50-55, diffuse HK, trivial MR, severe BAE, mild TR, PASP 37  . Dependence on continuous supplemental oxygen    3L  . Hepatitis C, chronic ([place]) 08/19/2015  . History of cardiac catheterization    a. LHC 6/17: LAD irregs, o/w no CAD  . Hypertension   . OSA (obstructive sleep apnea) 11/21/2015   No Cpap  . Sleep apnea   . Tobacco abuse    Review of Systems:   Constitutional: Negative for chills and fever.  Respiratory: Negative for shortness of breath.   Cardiovascular: Negative for chest pain and leg swelling.  Gastrointestinal: Negative for abdominal pain, nausea and vomiting.  Neurological: Negative for dizziness and headaches.   Physical Exam:  Vitals:   11/15/19 1023  BP: 120/73  Pulse: 91  Temp: 98.1 F (36.7 C)  TempSrc: Oral  SpO2: 100%  Weight: (!) 317 lb 8 oz (144 kg)  Height: 5\' 11"  (1.803 m)   Physical Exam Constitutional:      Appearance: He is obese.  Cardiovascular:     Rate and Rhythm: Normal rate. Rhythm irregular.     Pulses: Normal pulses.     Heart sounds: Normal heart sounds.  Pulmonary:     Effort: Pulmonary effort is normal.     Breath sounds: Normal breath sounds.  Abdominal:     General: Abdomen  is flat. Bowel sounds are normal.     Palpations: Abdomen is soft.  Musculoskeletal:        General: Swelling (1+ BL LE swelling) present.     Cervical back: Normal range of motion and neck supple.  Skin:    General: Skin is warm.     Capillary Refill: Capillary refill takes less than 2 seconds.  Neurological:     General: No focal deficit present.     Mental Status: He is alert and oriented to person, place, and time.  Psychiatric:        Mood and Affect: Mood normal.        Behavior: Behavior normal.      Assessment & Plan:   See Encounters Tab for problem based charting.  Patient discussed with Dr. Heber [place]

## 2019-11-16 LAB — BMP8+ANION GAP
Anion Gap: 16 mmol/L (ref 10.0–18.0)
BUN/Creatinine Ratio: 22 — ABNORMAL HIGH (ref 9–20)
BUN: 14 mg/dL (ref 6–24)
CO2: 35 mmol/L — ABNORMAL HIGH (ref 20–29)
Calcium: 9.8 mg/dL (ref 8.7–10.2)
Chloride: 82 mmol/L — ABNORMAL LOW (ref 96–106)
Creatinine, Ser: 0.63 mg/dL — ABNORMAL LOW (ref 0.76–1.27)
GFR calc Af Amer: 126 mL/min/{1.73_m2} (ref >59)
GFR calc non Af Amer: 109 mL/min/{1.73_m2} (ref >59)
Glucose: 101 mg/dL — ABNORMAL HIGH (ref 65–99)
Potassium: 3.1 mmol/L — ABNORMAL LOW (ref 3.5–5.2)
Sodium: 133 mmol/L — ABNORMAL LOW (ref 134–144)

## 2019-11-17 NOTE — Progress Notes (Signed)
Internal Medicine Clinic Attending ° °Case discussed with Dr. Krienke  At the time of the visit.  We reviewed the resident’s history and exam and pertinent patient test results.  I agree with the assessment, diagnosis, and plan of care documented in the resident’s note.  °

## 2019-12-08 ENCOUNTER — Other Ambulatory Visit: Payer: Self-pay | Admitting: Internal Medicine

## 2019-12-08 DIAGNOSIS — I509 Heart failure, unspecified: Secondary | ICD-10-CM | POA: Diagnosis not present

## 2019-12-08 DIAGNOSIS — I5032 Chronic diastolic (congestive) heart failure: Secondary | ICD-10-CM | POA: Diagnosis not present

## 2019-12-08 NOTE — Telephone Encounter (Signed)
#  180 with 1 refill sent 11/15/2019. Call placed to [facility] at [place]. States patient picked up 30 tabs yesterday. As patient takes 2 tabs TID he will be able to pick up new Rx in 4 days. Patient made aware. Hubbard Hartshorn, BSN, RN-BC

## 2019-12-08 NOTE — Telephone Encounter (Signed)
Need refill Potassium Chloride ER 20 MEQ TBCR  ;pt contact [phone]   [facility] [phone] - [place], [address] AT [address]

## 2019-12-15 ENCOUNTER — Encounter: Payer: Self-pay | Admitting: Internal Medicine

## 2019-12-15 ENCOUNTER — Observation Stay (HOSPITAL_COMMUNITY): Payer: Medicaid Other

## 2019-12-15 ENCOUNTER — Inpatient Hospital Stay (HOSPITAL_COMMUNITY)
Admission: AD | Admit: 2019-12-15 | Discharge: 2019-12-17 | DRG: 291 | Disposition: A | Discharge status: Home / Self Care | Payer: Medicaid Other | Source: Ambulatory Visit | Attending: Student in an Organized Health Care Education/Training Program | Admitting: Student in an Organized Health Care Education/Training Program

## 2019-12-15 ENCOUNTER — Ambulatory Visit (INDEPENDENT_AMBULATORY_CARE_PROVIDER_SITE_OTHER): Payer: Medicaid Other | Admitting: Internal Medicine

## 2019-12-15 VITALS — BP 135/67 | HR 81 | Temp 98.2°F | Ht 71.0 in | Wt 324.8 lb

## 2019-12-15 DIAGNOSIS — R062 Wheezing: Secondary | ICD-10-CM

## 2019-12-15 DIAGNOSIS — R0602 Shortness of breath: Secondary | ICD-10-CM

## 2019-12-15 DIAGNOSIS — I5033 Acute on chronic diastolic (congestive) heart failure: Secondary | ICD-10-CM | POA: Diagnosis present

## 2019-12-15 DIAGNOSIS — R05 Cough: Secondary | ICD-10-CM | POA: Diagnosis not present

## 2019-12-15 DIAGNOSIS — I5032 Chronic diastolic (congestive) heart failure: Secondary | ICD-10-CM

## 2019-12-15 DIAGNOSIS — M5412 Radiculopathy, cervical region: Secondary | ICD-10-CM

## 2019-12-15 DIAGNOSIS — I5023 Acute on chronic systolic (congestive) heart failure: Secondary | ICD-10-CM | POA: Diagnosis not present

## 2019-12-15 DIAGNOSIS — R531 Weakness: Secondary | ICD-10-CM | POA: Diagnosis present

## 2019-12-15 DIAGNOSIS — I517 Cardiomegaly: Secondary | ICD-10-CM | POA: Diagnosis not present

## 2019-12-15 DIAGNOSIS — I872 Venous insufficiency (chronic) (peripheral): Secondary | ICD-10-CM | POA: Diagnosis present

## 2019-12-15 DIAGNOSIS — Z7901 Long term (current) use of anticoagulants: Secondary | ICD-10-CM

## 2019-12-15 DIAGNOSIS — I509 Heart failure, unspecified: Secondary | ICD-10-CM

## 2019-12-15 DIAGNOSIS — Z9981 Dependence on supplemental oxygen: Secondary | ICD-10-CM

## 2019-12-15 DIAGNOSIS — R059 Cough, unspecified: Secondary | ICD-10-CM

## 2019-12-15 DIAGNOSIS — F17210 Nicotine dependence, cigarettes, uncomplicated: Secondary | ICD-10-CM | POA: Diagnosis present

## 2019-12-15 DIAGNOSIS — I110 Hypertensive heart disease with heart failure: Principal | ICD-10-CM | POA: Diagnosis present

## 2019-12-15 DIAGNOSIS — E876 Hypokalemia: Secondary | ICD-10-CM | POA: Diagnosis present

## 2019-12-15 DIAGNOSIS — I4821 Permanent atrial fibrillation: Secondary | ICD-10-CM | POA: Diagnosis present

## 2019-12-15 DIAGNOSIS — J90 Pleural effusion, not elsewhere classified: Secondary | ICD-10-CM | POA: Diagnosis present

## 2019-12-15 DIAGNOSIS — Z833 Family history of diabetes mellitus: Secondary | ICD-10-CM

## 2019-12-15 DIAGNOSIS — J9611 Chronic respiratory failure with hypoxia: Secondary | ICD-10-CM | POA: Diagnosis present

## 2019-12-15 DIAGNOSIS — Z20822 Contact with and (suspected) exposure to covid-19: Secondary | ICD-10-CM | POA: Diagnosis present

## 2019-12-15 DIAGNOSIS — K5900 Constipation, unspecified: Secondary | ICD-10-CM

## 2019-12-15 DIAGNOSIS — G4733 Obstructive sleep apnea (adult) (pediatric): Secondary | ICD-10-CM | POA: Diagnosis present

## 2019-12-15 DIAGNOSIS — Z6841 Body Mass Index (BMI) 40.0 and over, adult: Secondary | ICD-10-CM

## 2019-12-15 DIAGNOSIS — I10 Essential (primary) hypertension: Secondary | ICD-10-CM | POA: Diagnosis present

## 2019-12-15 HISTORY — DX: Heart failure, unspecified: I50.9

## 2019-12-15 LAB — CBC
HCT: 37.9 % — ABNORMAL LOW (ref 39.0–52.0)
Hemoglobin: 12.7 g/dL — ABNORMAL LOW (ref 13.0–17.0)
MCH: 31.7 pg (ref 26.0–34.0)
MCHC: 33.5 g/dL (ref 30.0–36.0)
MCV: 94.5 fL (ref 80.0–100.0)
Platelets: 223 10*3/uL (ref 150–400)
RBC: 4.01 MIL/uL — ABNORMAL LOW (ref 4.22–5.81)
RDW: 15.7 % — ABNORMAL HIGH (ref 11.5–15.5)
WBC: 6.2 10*3/uL (ref 4.0–10.5)
nRBC: 0 % (ref 0.0–0.2)

## 2019-12-15 LAB — GLUCOSE, CAPILLARY: Glucose-Capillary: 143 mg/dL — ABNORMAL HIGH (ref 70–99)

## 2019-12-15 LAB — COMPREHENSIVE METABOLIC PANEL
ALT: 20 U/L (ref 0–44)
ALT: 20 U/L (ref 0–44)
AST: 25 U/L (ref 15–41)
AST: 22 U/L (ref 15–41)
Albumin: 3.1 g/dL — ABNORMAL LOW (ref 3.5–5.0)
Albumin: 3.2 g/dL — ABNORMAL LOW (ref 3.5–5.0)
Alkaline Phosphatase: 42 U/L (ref 38–126)
Alkaline Phosphatase: 45 U/L (ref 38–126)
Anion gap: 12 (ref 5–15)
Anion gap: 9 (ref 5–15)
BUN: 10 mg/dL (ref 6–20)
BUN: 10 mg/dL (ref 6–20)
CO2: 33 mmol/L — ABNORMAL HIGH (ref 22–32)
CO2: 35 mmol/L — ABNORMAL HIGH (ref 22–32)
Calcium: 9.1 mg/dL (ref 8.9–10.3)
Calcium: 9.3 mg/dL (ref 8.9–10.3)
Chloride: 85 mmol/L — ABNORMAL LOW (ref 98–111)
Chloride: 87 mmol/L — ABNORMAL LOW (ref 98–111)
Creatinine, Ser: 0.7 mg/dL (ref 0.61–1.24)
Creatinine, Ser: 0.69 mg/dL (ref 0.61–1.24)
GFR calc Af Amer: >60 mL/min (ref >60)
GFR calc Af Amer: >60 mL/min (ref >60)
GFR calc non Af Amer: >60 mL/min (ref >60)
GFR calc non Af Amer: >60 mL/min (ref >60)
Glucose, Bld: 104 mg/dL — ABNORMAL HIGH (ref 70–99)
Glucose, Bld: 101 mg/dL — ABNORMAL HIGH (ref 70–99)
Potassium: 3.2 mmol/L — ABNORMAL LOW (ref 3.5–5.1)
Potassium: 3.2 mmol/L — ABNORMAL LOW (ref 3.5–5.1)
Sodium: 130 mmol/L — ABNORMAL LOW (ref 135–145)
Sodium: 131 mmol/L — ABNORMAL LOW (ref 135–145)
Total Bilirubin: 0.9 mg/dL (ref 0.3–1.2)
Total Bilirubin: 0.6 mg/dL (ref 0.3–1.2)
Total Protein: 6.9 g/dL (ref 6.5–8.1)
Total Protein: 6.6 g/dL (ref 6.5–8.1)

## 2019-12-15 LAB — CBC WITH DIFFERENTIAL/PLATELET
Abs Immature Granulocytes: 0.02 10*3/uL (ref 0.00–0.07)
Basophils Absolute: 0.1 10*3/uL (ref 0.0–0.1)
Basophils Relative: 1 %
Eosinophils Absolute: 0.1 10*3/uL (ref 0.0–0.5)
Eosinophils Relative: 2 %
HCT: 39 % (ref 39.0–52.0)
Hemoglobin: 12.9 g/dL — ABNORMAL LOW (ref 13.0–17.0)
Immature Granulocytes: 0 %
Lymphocytes Relative: 22 %
Lymphs Abs: 1.4 10*3/uL (ref 0.7–4.0)
MCH: 30.6 pg (ref 26.0–34.0)
MCHC: 33.1 g/dL (ref 30.0–36.0)
MCV: 92.6 fL (ref 80.0–100.0)
Monocytes Absolute: 0.9 10*3/uL (ref 0.1–1.0)
Monocytes Relative: 13 %
Neutro Abs: 4.1 10*3/uL (ref 1.7–7.7)
Neutrophils Relative %: 62 %
Platelets: 217 10*3/uL (ref 150–400)
RBC: 4.21 MIL/uL — ABNORMAL LOW (ref 4.22–5.81)
RDW: 15.7 % — ABNORMAL HIGH (ref 11.5–15.5)
WBC: 6.6 10*3/uL (ref 4.0–10.5)
nRBC: 0 % (ref 0.0–0.2)

## 2019-12-15 LAB — RESPIRATORY PANEL BY RT PCR (FLU A&B, COVID)
Influenza A by PCR: NEGATIVE
Influenza B by PCR: NEGATIVE
SARS Coronavirus 2 by RT PCR: NEGATIVE

## 2019-12-15 LAB — HEMOGLOBIN A1C
Hgb A1c MFr Bld: 6 % — ABNORMAL HIGH (ref 4.8–5.6)
Mean Plasma Glucose: 125.5 mg/dL

## 2019-12-15 LAB — MAGNESIUM: Magnesium: 1.9 mg/dL (ref 1.7–2.4)

## 2019-12-15 LAB — BRAIN NATRIURETIC PEPTIDE: B Natriuretic Peptide: 120.4 pg/mL — ABNORMAL HIGH (ref 0.0–100.0)

## 2019-12-15 LAB — PHOSPHORUS: Phosphorus: 4.1 mg/dL (ref 2.5–4.6)

## 2019-12-15 MED ORDER — SODIUM CHLORIDE 0.9% FLUSH
3.0000 mL | Freq: Two times a day (BID) | INTRAVENOUS | Status: DC
  Administered 2019-12-16 – 2019-12-17 (×3): 3 mL via INTRAVENOUS

## 2019-12-15 MED ORDER — POLYETHYLENE GLYCOL 3350 17 G PO PACK: 17.0000 g | PACK | Freq: Every day | ORAL | Status: DC | PRN

## 2019-12-15 MED ORDER — ACETAMINOPHEN 325 MG PO TABS: 650.0000 mg | ORAL_TABLET | Freq: Four times a day (QID) | ORAL | Status: DC | PRN

## 2019-12-15 MED ORDER — POTASSIUM CHLORIDE 10 MEQ/100ML IV SOLN
10.0000 meq | INTRAVENOUS | Status: AC
  Administered 2019-12-15 – 2019-12-16 (×5): 10 meq via INTRAVENOUS
  Filled 2019-12-15 (×6): qty 100

## 2019-12-15 MED ORDER — ALBUTEROL SULFATE HFA 108 (90 BASE) MCG/ACT IN AERS: 1.0000 | INHALATION_SPRAY | RESPIRATORY_TRACT | Status: DC

## 2019-12-15 MED ORDER — ALBUTEROL SULFATE (2.5 MG/3ML) 0.083% IN NEBU
2.5000 mg | INHALATION_SOLUTION | Freq: Three times a day (TID) | RESPIRATORY_TRACT | Status: DC
  Administered 2019-12-16: 2.5 mg via RESPIRATORY_TRACT
  Filled 2019-12-15: qty 3

## 2019-12-15 MED ORDER — RIVAROXABAN 20 MG PO TABS
20.0000 mg | ORAL_TABLET | Freq: Every day | ORAL | Status: DC
  Administered 2019-12-15 – 2019-12-16 (×2): 20 mg via ORAL
  Filled 2019-12-15 (×2): qty 1

## 2019-12-15 MED ORDER — FUROSEMIDE 10 MG/ML IJ SOLN
80.0000 mg | Freq: Once | INTRAMUSCULAR | Status: AC
  Administered 2019-12-15: 80 mg via INTRAVENOUS
  Filled 2019-12-15: qty 8

## 2019-12-15 MED ORDER — ACETAMINOPHEN 650 MG RE SUPP: 650.0000 mg | Freq: Four times a day (QID) | RECTAL | Status: DC | PRN

## 2019-12-15 MED ORDER — ALBUTEROL SULFATE (2.5 MG/3ML) 0.083% IN NEBU
2.5000 mg | INHALATION_SOLUTION | RESPIRATORY_TRACT | Status: DC
  Administered 2019-12-15: 2.5 mg via RESPIRATORY_TRACT
  Filled 2019-12-15: qty 3

## 2019-12-15 NOTE — Assessment & Plan Note (Signed)
His left arm strength continues to decrease, started about 6 months ago, and he can barely lift it now. He has some tingling in his right 5th finger but otherwise has no numbness, tingling or pain. He has not had anyone contact him from neurosurgery that he knows of.  On PE he can no longer abduct or flex his arm more than 40 degrees. He does have full passive range of motion with slight pain on extension only. Xr 8/06 showed spondylosis of C5-C6. It is strange that he has no radicular pain, but with good passive ROM would like to rule out cervical etiology.   - referral placed to neurosurgery  - MRI when able to lie flat, here with worsened respiratory status today unfortunately

## 2019-12-15 NOTE — H&P (Signed)
Date: 12/15/2019               Patient Name:  Frank Morales MRN: [id number]  DOB: 07-06-1960 Age / Sex: 59 y.o., male   PCP: [name], MD              Medical Service: Internal Medicine Teaching Service              Attending Physician: Dr. Evette Doffing, Mallie Mussel, *    First Contact: Jacklynn Bue, MS3 Pager: [phone]  Second Contact: Dr. Iona Beard Pager: [phone]  Third Contact Dr. Tamsen Snider  Pager: [phone]       After Hours (After 5p/  First Contact Pager: [phone]  weekends / holidays): Second Contact Pager: [phone]   Chief Complaint: leg swelling   History of Present Illness: Frank Morales Is a 59 yo male with a medical history of HFpEF with EF 55-60% (06/2019), persistent atrial fibrillation (on Xarelto), chronic respiratory failure (on 3L [place]) hypertension, tobacco use and obesity who presented the Internal Medicine Resident clinic for progressively worsening leg swelling, and weight gain.   He reports that he first noticed symptoms a few days ago when he cooked for visting family. He states that he "over did it " with his diet. He reports that he cooked/consumed macaroni and cheese, collared greens and fried foods. His diet normally consists of baked foods, and no salt intake. He checks his daily weights and reports that he has noticed some weight gain over the last few days. He normally weighs around 312 and reports an increase to 324. He does not recall his fluid intake, but reports dry mouth and polydipsia. He reports polyuria, but nothing out of the norm.  He uses 3L of O2 [place] daily and denies increased supplemental O2 need.He reports adherence to his diuretic which he takes every M/W/F, but did not take his dose this morning.  He also reports ongoing weakness and reduced range of motion in his left arm. He denies pain with movement. He was  unable to undergo an MRI due to his orthopnea and referred to outpatient Neurology, but has not seen anyone  yet.  During his clinic visit, it was noted that his O2 saturation decreased to 86% on 3L [place] with ambulation.   Per chart review, he was recently discharged (10/24/19) for an acute exacerbation of his heart failure.   Meds: .meds - Diltiazem 2 capsules  - Fluticasone spray - Furosemide 80 bid - Metolazone 2.5 mg three times weekly - Potassium 40 mEq 3 time daily - Rivaroxaban 20 mg daily   Allergies: Allergies as of 12/15/2019 - Review Complete 11/15/2019  Allergen Reaction Noted  . Lactose intolerance (gi) Nausea And Vomiting 07/16/2016   Past Medical History:  Diagnosis Date  . Atrial fibrillation and flutter ([place]) 08/17/2015   A. S/p failed DCCV // b. Severe BAE on echo >> rate control strategy (has seen AF clinic) // c. Xarelto for anticoag (CHADS2-VASc=2 / CHF, HTN)  . Chronic diastolic CHF (congestive heart failure) ([place]) 08/30/2015   A. Echo 6/17: Apical HK, moderate focal basal and mild concentric LVH, EF 50-55, diffuse HK, trivial MR, severe BAE, mild TR, PASP 37  . Dependence on continuous supplemental oxygen    3L  . Hepatitis C, chronic ([place]) 08/19/2015  . History of cardiac catheterization    a. LHC 6/17: LAD irregs, o/w no CAD  . Hypertension   . OSA (obstructive sleep apnea) 11/21/2015   No Cpap  .  Sleep apnea   . Tobacco abuse     Family History: He reports diabetes in multiple family members. He does not report family history of CAD or cancer.  Social History: Frank Morales lives with his uncle. He is unable to work due to his respiratory difficulties and inability to stand for long periods of time. His spends most of his days at home watching television. He smokes 1 pack of cigarettes/week. He reports drinking alcohol every day, and drinks up to 4 beers a day. He reports occasional cocaine use.   Review of Systems: A complete ROS was negative except as per HPI.   Physical Exam: Blood pressure 103/60, pulse 71, temperature (!) 97.5 F (36.4 C), temperature  source Oral, resp. rate 20, SpO2 97 %. General:  Obese habitus Sitting up in chair. Breathing with Portable O2 machine on 3L [place]. NAD HENT:  [place]/AT, EOM intact. Cervical neck FROM Heart:  Normal rate and rhythm.  Lungs:  No increased work of breathing. Able to speak in full sentences without difficulty. Bilateral expiratory wheezes in all lung fields. No rales or rhonchi.  Abdomen: Mildly distended. No TTP. Normo-active bowel sounds in all quadrants.  MSK:  Bilateral lower extremity 1+ pitting edema up to mid thigh.  Neurological:  A&Ox3. Decreased active abduction and extension of LUE, full passive ROM. No pain with movement. 5/5 strength. 5/5 grip strength. Psych:  Congruent mood and affect.   EKG: personally reviewed my interpretation is pending  CXR: personally reviewed my interpretation is Cardiomegaly with small right pleural effusion.  Assessment & Plan by Problem: Active Problems:   Acute exacerbation of CHF (congestive heart failure) [facility])  Frank Morales is 59 yo male with a medical history of HFpEF with EF 55-60% (06/2019), persistent atrial fibrillation (on Xarelto), chronic respiratory failure (on 3L ), OSA,  hypertension, tobacco use and obesity who presented with 12lb weight gain in 3 days, bilateral LLE, and hypoxia on supplemental oxygen.   Acute exacerbation of CHF Patient with HFpEF presented with hypoxia, bilateral lower extremity edema and increased weight gain. Last Echo (06/2019) showed EF 55-60% with normal LV function and mild LVH. Recently hospitalized on 10/21/19-10/26/19 for an acute exacerbation and discharged on Lasix 80mg  BID and Metolazone 2.5mg  MWF, and Cl 24mEq TID. He reports adherence to his medications. He denies increased dyspnea but does endorse 12lb weight gain and LLE edema after dietary indiscretion over the last few days. His weight at last discharge was 143kg and today's weight is 147.3kg. Symptoms most likely secondary to volume overload. We  start diuresis, provide oxygen and continue to monitor symptoms and labs.  -- Lasix --morning BMP and closely monitor electolytes --daily weights --strict I/O --fluid restriction --supplemental O2 prn - Hold off on Cardizem due to acute CHF exacerbation.  Chronic Respiratory Failure Patient has chronic dyspnea at rest and on exertion. Use 3L of O2 via nasal canal. He reports that he has not have any worsening dyspnea and never requires more than 3L. At clinic, his O2 saturation was 86% with ambulation. On physical exam he is saturating well at rest and is not any respiratory distress, but has bilaterally expiratory wheezing. With his smoking history, his symptoms could be significant for obstructive lung disease. We will continue O2 supplementation as needed and continue to monitor symptoms.  --supplemental O2 as needed - Albuterol MDI and Nebs prn  Atrial Fibrillation He is currently taking Rivaroxaban 20mg . Currently taking Diltazem at home.  - Will hold off on diltiazem due  to acute CHF exacerbation.   OSA Patient reports he uses supplemental O2 at night, but does not use a CPAP. He endorses orthopnea, but is able to sleep on stomach or side.  Left Upper Extremity Weakness He reports decreased active range of motion of left upper extremity. He denies any pain with movement. On physical exam, there is full range of motion without pain. He does carry his oxygen machine across his left shoulder. He had an ambulatory referral to Neurology from last admission, but is unable to complete MRI due to inability to lie flat without shortness of breath.    Dispo: Admit patient to Inpatient with expected length of stay greater than 2 midnights.   Attestation for Student Documentation:  I personally was present and performed or re-performed the history, physical exam and medical decision-making activities of this service and have verified that the service and findings are accurately documented in  the student's note.  Marianna Payment, MD 12/15/2019, 9:05 PM   Signed: Marianna Payment, MD 12/15/2019, 8:47 PM  Pager: [phone]

## 2019-12-15 NOTE — Assessment & Plan Note (Addendum)
He has been on lasix 80 mg bid. During last office visit he was placed on metolazone 2.5 mg three times per week and was on two times per week prior to this. Also on dK-dur 40 mg bid. He was doing better after last visit but now has had increased SOB and non-productive cough for the last three days. Endorses dizziness. He has daily weight with him which has been running around 312 at home, today is 324. He has been unable to lie down the last few nights and is sleeping in his recliner.  Patient diaphoretic, coughing throughout exam, +wheezing bilaterally, no crackles. He has +1 pitting edema, no JVP on exam. With ambulation desats to 86% on 3L [place]. Discussing with pharmacy, he never picked up 8/20 metolazone but unclear if he had prior prescription.  Likely HFpEF exacerbation with possible underlying COPD or structural lung disease. With dizziness and desaturation to 86% on ambulation will admit overnight for diuresis and further work-up. Can possibly go home with heart failure at home and may need metolazone

## 2019-12-15 NOTE — Hospital Course (Addendum)
CC:  HPI:  [name] Is a 59 yo male with a medical history of HFpEF with EF 55-60% (06/2019), persistent atrial fibrillation (on Xarelto), chronic respiratory failure (on 3L [place]) hypertension, tobacco use and obesity who presented the Internal Medicine Resident clinic for progressively worsening leg swelling, and weight gain.   He reports that he first noticed symptoms a few days ago when he cooked for visting family. He states that he "over did it " with his diet. He reports that he cooked/consumed macaroni and cheese, collared greens and fried foods. His diet normally consists of baked foods, and no salt intake. He checks his daily weights and reports that he has noticed some weight gain over the last few days. He normally weighs around 312 and reports an increase to 324. He does not recall his fluid intake, but reports dry mouth and polydipsia. He reports polyuria, but nothing out of the norm.  He uses 3L of O2 [place] daily and denies increased supplemental O2 need.He reports adherence to his diuretic which he takes every M/W/F, but did not take his dose this morning.  He also reports ongoing weakness and reduced range of motion in his left arm. He denies pain with movement. He was  unable to undergo an MRI due to his orthopnea and referred to outpatient Neurology, but has not seen anyone yet.  During his clinic visit, it was noted that his O2 saturation decreased to 86% on 3L [place] with ambulation.   Per chart review, he was recently discharged (10/24/19) for an acute exacerbation of his heart failure.     Frank Morales is 59 yo male with a medical history of FpEF with EF 55-60% (06/2019), persistent atrial fibrillation (on Xarelto), chronic respiratory failure (on 3L [place]) hypertension, tobacco use and obesity who presented with 12lb weight gain in 3 days, bilateral LLE, and hypoxia on supplemental oxygen.   Acute on Chronic Heart Failure with Preserved EF: Presented with hypoxia on a,  bilateral lower extremity edema and weight gain.  His last Echo (06/2019) showed EF 55-60% with normal LV function and mild LVH.We diuresed with IV Lasix 80mg  BID and he had a good response. Weight has decreased 1kg he had net output of 1L. He reported that his dyspnea, leg swelling and orthopnea improved. He is able sleep lying flat and breathe well on his normal 3L . Renal function and electrolytes also remain stable. This is his 2nd hospitalization for an acute exacerbation of his CHF, he may benefit from an SLGT2 inhibitor, Empagliflozin, which has shown to decrease hospitalizations in patients with HFpEF. We discussed the the benefits and risks of this medication and he would like to speak with PCP about it. We discharged on Lasix 80mg  BID and Metolazone 2.5mg  MWF, and Cl 23mEq TID.  Chronic Respiratory Failure Patient has chronic dyspnea at rest and on exertion. Use 3L of O2 via nasal canal. He reports that he has not have any worsening dyspnea and never requires more than 3L. At clinic, his O2 saturation was 86% with ambulation. On physical exam he is saturating well at rest and is not any respiratory distress,and lungs are clear to auscultation With his smoking history, his symptoms could be significant for obstructive lung disease and will need further evaluation. He should continue his home oxygen and follow up in the outpatient setting.   Atrial Fibrillation Currently taking Diltiazem 480mg  for rate control and Rivaroxaban 20mg  for anticoagulation. We held his CCB for 24hrs and resumed the  rest of his admission. He will be discharged on home regimen and should follow up in the outpatient setting.  Hypertension Currently taking Diltiazem 480mg . His BP remained stable during admission. He should continue follow up and BP management with PCP.  OSA Patient reports he uses supplemental O2 at night. He may benefit from a CPAP in the future and should discuss/follow up with his PCP.   Left  Upper Extremity Weakness He has had ongoing LUE weakness for about 6 months. He has reduced abduction and extension of left arm, but full passive range of motion. He denies pain. After diuresis, he is now able to lie flat and undergo an MRI to rule out cervical etiology. He has a referral to outpatient neurosurgery and should follow up.

## 2019-12-15 NOTE — Progress Notes (Signed)
   CC: Diastolic Heart Failure   HPI:  Frank Morales is a 59 y.o. with PMH as below.   Please see A&P for assessment of the patient's acute and chronic medical conditions.   Past Medical History:  Diagnosis Date  . Atrial fibrillation and flutter ([place]) 08/17/2015   A. S/p failed DCCV // b. Severe BAE on echo >> rate control strategy (has seen AF clinic) // c. Xarelto for anticoag (CHADS2-VASc=2 / CHF, HTN)  . Chronic diastolic CHF (congestive heart failure) ([place]) 08/30/2015   A. Echo 6/17: Apical HK, moderate focal basal and mild concentric LVH, EF 50-55, diffuse HK, trivial MR, severe BAE, mild TR, PASP 37  . Dependence on continuous supplemental oxygen    3L  . Hepatitis C, chronic ([place]) 08/19/2015  . History of cardiac catheterization    a. LHC 6/17: LAD irregs, o/w no CAD  . Hypertension   . OSA (obstructive sleep apnea) 11/21/2015   No Cpap  . Sleep apnea   . Tobacco abuse    Review of Systems:   Review of Systems  HENT: Negative for congestion and sore throat.   Respiratory: Positive for cough, shortness of breath and wheezing. Negative for hemoptysis and sputum production.   Cardiovascular: Positive for orthopnea and leg swelling. Negative for chest pain and palpitations.  Gastrointestinal: Negative for nausea and vomiting.  Genitourinary: Negative for dysuria and urgency.  Neurological: Positive for dizziness and focal weakness.   Physical Exam: Constitution: diaphoretic, morbidly obese Cardio: RRR, no m/r/g, +1 LE edema  Respiratory: tachypnic, labored breathing, wheezing bilaterally, no rales or rhonchi Abdominal: NTTP, soft, non-distended MSK: moving all extremities Neuro: normal affect, a&ox3 Skin: c/d/i    Vitals:   12/15/19 1325  BP: 135/67  Pulse: 81  Temp: 98.2 F (36.8 C)  TempSrc: Oral  SpO2: 100%  Weight: (!) 324 lb 12.8 oz (147.3 kg)  Height: 5\' 11"  (1.803 m)    Assessment & Plan:   See Encounters Tab for problem based  charting.  Patient discussed with Dr. Daryll Drown

## 2019-12-16 ENCOUNTER — Encounter: Payer: Medicaid Other | Admitting: Internal Medicine

## 2019-12-16 DIAGNOSIS — G4733 Obstructive sleep apnea (adult) (pediatric): Secondary | ICD-10-CM | POA: Diagnosis not present

## 2019-12-16 DIAGNOSIS — R531 Weakness: Secondary | ICD-10-CM | POA: Diagnosis not present

## 2019-12-16 DIAGNOSIS — J90 Pleural effusion, not elsewhere classified: Secondary | ICD-10-CM | POA: Diagnosis not present

## 2019-12-16 DIAGNOSIS — I5033 Acute on chronic diastolic (congestive) heart failure: Secondary | ICD-10-CM

## 2019-12-16 DIAGNOSIS — Z79899 Other long term (current) drug therapy: Secondary | ICD-10-CM | POA: Diagnosis not present

## 2019-12-16 DIAGNOSIS — I4821 Permanent atrial fibrillation: Secondary | ICD-10-CM | POA: Diagnosis not present

## 2019-12-16 DIAGNOSIS — Z9981 Dependence on supplemental oxygen: Secondary | ICD-10-CM | POA: Diagnosis not present

## 2019-12-16 DIAGNOSIS — I872 Venous insufficiency (chronic) (peripheral): Secondary | ICD-10-CM | POA: Diagnosis present

## 2019-12-16 DIAGNOSIS — Z7901 Long term (current) use of anticoagulants: Secondary | ICD-10-CM | POA: Diagnosis not present

## 2019-12-16 DIAGNOSIS — J9611 Chronic respiratory failure with hypoxia: Secondary | ICD-10-CM | POA: Diagnosis not present

## 2019-12-16 DIAGNOSIS — I110 Hypertensive heart disease with heart failure: Secondary | ICD-10-CM | POA: Diagnosis not present

## 2019-12-16 DIAGNOSIS — R600 Localized edema: Secondary | ICD-10-CM

## 2019-12-16 DIAGNOSIS — I509 Heart failure, unspecified: Secondary | ICD-10-CM

## 2019-12-16 DIAGNOSIS — Z20822 Contact with and (suspected) exposure to covid-19: Secondary | ICD-10-CM | POA: Diagnosis present

## 2019-12-16 DIAGNOSIS — M47812 Spondylosis without myelopathy or radiculopathy, cervical region: Secondary | ICD-10-CM | POA: Diagnosis not present

## 2019-12-16 DIAGNOSIS — E876 Hypokalemia: Secondary | ICD-10-CM | POA: Diagnosis present

## 2019-12-16 DIAGNOSIS — F17210 Nicotine dependence, cigarettes, uncomplicated: Secondary | ICD-10-CM | POA: Diagnosis present

## 2019-12-16 DIAGNOSIS — Z6841 Body Mass Index (BMI) 40.0 and over, adult: Secondary | ICD-10-CM | POA: Diagnosis not present

## 2019-12-16 DIAGNOSIS — Z833 Family history of diabetes mellitus: Secondary | ICD-10-CM | POA: Diagnosis not present

## 2019-12-16 HISTORY — DX: Heart failure, unspecified: I50.9

## 2019-12-16 LAB — LIPID PANEL
Cholesterol: 151 mg/dL (ref 0–200)
HDL: 39 mg/dL — ABNORMAL LOW (ref >40)
LDL Cholesterol: 97 mg/dL (ref 0–99)
Total CHOL/HDL Ratio: 3.9 RATIO
Triglycerides: 77 mg/dL (ref <150)
VLDL: 15 mg/dL (ref 0–40)

## 2019-12-16 LAB — COMPREHENSIVE METABOLIC PANEL
ALT: 17 U/L (ref 0–44)
AST: 19 U/L (ref 15–41)
Albumin: 3 g/dL — ABNORMAL LOW (ref 3.5–5.0)
Alkaline Phosphatase: 41 U/L (ref 38–126)
Anion gap: 9 (ref 5–15)
BUN: 9 mg/dL (ref 6–20)
CO2: 38 mmol/L — ABNORMAL HIGH (ref 22–32)
Calcium: 9 mg/dL (ref 8.9–10.3)
Chloride: 87 mmol/L — ABNORMAL LOW (ref 98–111)
Creatinine, Ser: 0.66 mg/dL (ref 0.61–1.24)
GFR calc Af Amer: >60 mL/min (ref >60)
GFR calc non Af Amer: >60 mL/min (ref >60)
Glucose, Bld: 180 mg/dL — ABNORMAL HIGH (ref 70–99)
Potassium: 3.5 mmol/L (ref 3.5–5.1)
Sodium: 134 mmol/L — ABNORMAL LOW (ref 135–145)
Total Bilirubin: 0.8 mg/dL (ref 0.3–1.2)
Total Protein: 6.9 g/dL (ref 6.5–8.1)

## 2019-12-16 LAB — CBC
HCT: 37.5 % — ABNORMAL LOW (ref 39.0–52.0)
Hemoglobin: 12.4 g/dL — ABNORMAL LOW (ref 13.0–17.0)
MCH: 31.6 pg (ref 26.0–34.0)
MCHC: 33.1 g/dL (ref 30.0–36.0)
MCV: 95.4 fL (ref 80.0–100.0)
Platelets: 201 10*3/uL (ref 150–400)
RBC: 3.93 MIL/uL — ABNORMAL LOW (ref 4.22–5.81)
RDW: 16.3 % — ABNORMAL HIGH (ref 11.5–15.5)
WBC: 4.5 10*3/uL (ref 4.0–10.5)
nRBC: 0 % (ref 0.0–0.2)

## 2019-12-16 LAB — RETICULOCYTES
Immature Retic Fract: 22.6 % — ABNORMAL HIGH (ref 2.3–15.9)
RBC.: 3.93 MIL/uL — ABNORMAL LOW (ref 4.22–5.81)
Retic Count, Absolute: 101.8 10*3/uL (ref 19.0–186.0)
Retic Ct Pct: 2.6 % (ref 0.4–3.1)

## 2019-12-16 LAB — FERRITIN: Ferritin: 43 ng/mL (ref 24–336)

## 2019-12-16 MED ORDER — IPRATROPIUM-ALBUTEROL 0.5-2.5 (3) MG/3ML IN SOLN: 3.0000 mL | Freq: Four times a day (QID) | RESPIRATORY_TRACT | Status: DC | PRN

## 2019-12-16 MED ORDER — FUROSEMIDE 10 MG/ML IJ SOLN
80.0000 mg | Freq: Two times a day (BID) | INTRAMUSCULAR | Status: DC
  Administered 2019-12-16 – 2019-12-17 (×3): 80 mg via INTRAVENOUS
  Filled 2019-12-16 (×3): qty 8

## 2019-12-16 MED ORDER — DILTIAZEM HCL ER COATED BEADS 240 MG PO CP24
480.0000 mg | ORAL_CAPSULE | Freq: Every day | ORAL | Status: DC
  Administered 2019-12-16 – 2019-12-17 (×2): 480 mg via ORAL
  Filled 2019-12-16 (×2): qty 2

## 2019-12-16 NOTE — Progress Notes (Addendum)
Subjective: Frank Morales reports that he is feeling well today. He reports that he is breathing well on supplemental O2 3L [place] and that he has not noticed any worsening dyspnea. He reports that he is unable to sleep lying down and slept in a upright position last night. Lying flat causes worsening dyspnea and cough. He still reports weakness in his left arm, but no pain. Otherwise he has no additional concerns for today.   Objective:  Vital signs in last 24 hours: Vitals:   12/16/19 0748 12/16/19 0750 12/16/19 0753 12/16/19 1005  BP: 106/61 106/61    Pulse: 88 79 66 86  Resp: (!) 32 16 15   Temp: 98.2 F (36.8 C)     TempSrc: Axillary     SpO2: 100% 100% 100% 98%  Weight:       Weight change:   Intake/Output Summary (Last 24 hours) at 12/16/2019 1054 Last data filed at 12/16/2019 0339 Gross per 24 hour  Intake 440 ml  Output --  Net 440 ml   Physical Exam Constitutional:      Appearance: He is obese.  HENT:     Head: Normocephalic.  Eyes:     Extraocular Movements: Extraocular movements intact.  Cardiovascular:     Rate and Rhythm: Normal rate and regular rhythm.     Heart sounds: Normal heart sounds.     Comments: JVP elevation up to mid-jaw Pulmonary:     Breath sounds: Normal breath sounds.     Comments: Orthopnea and cough present after lying at 30 degrees Musculoskeletal:     Cervical back: Normal range of motion.     Right lower leg: 1+ Pitting Edema present.     Left lower leg: 1+ Pitting Edema present.  Skin:    General: Skin is warm and dry.     Comments: Stasis dermatitis on lower extemities noted b/l  Neurological:     Mental Status: He is alert and oriented to person, place, and time.  Psychiatric:        Mood and Affect: Mood normal.        Behavior: Behavior normal.     Assessment/Plan:  Principal Problem:   Acute exacerbation of CHF (congestive heart failure) (HCC) Active Problems:   Morbid obesity (HCC)   Hypertension   OSA (obstructive  sleep apnea)   Permanent atrial fibrillation   Pleural effusion on right  Frank Morales is a 59 yo male with a medical history of HFpEF with EF 55-60% (06/2019), persistent atrial fibrillation (on Xarelto), chronic respiratory failure (on 3L [place]) hypertension, tobacco use and obesity who presented progressively worsening leg swelling, and weight gain and hypoxia with exertion while on oxygen 3L [place] and admitted for acute exacerbation of congestive heart failure.  Acute on Chronic Heart Failure with Preserved EF: Presented with hypoxia, bilateral lower extremity edema and 12lb weight gain over a few days. S/p 1 dose of  Lasix 80mg . He denies worsening dyspnea at rest or exertion, and is still breathing well on 3L [place]. On exam, he does have pretty significant orthopnea that causes a cough, and still has significant lower extremity edema. We will continue with IV diuresis and most likely as fluid status improves, his symptoms should improve.  --IV Lasix 80 BID --BMP and closely monitor renal function and electrolytes --daily weights --strict I/O --fluid restriction --supplemental O2 as needed  Chronic Respiratory Failure  Patient has chronic dyspnea at rest and exertion. He uses 3L of O2 at baseline. He  reports that he is breathing well and only has dyspnea with walking far distancing. On physical exam, his lungs are clear bilaterally. We will continue to provide supplemental O2 as needed and Nebulizer treatment.  --supplemental O2 as needed --DuoNeb treatment  Atrial Fibrillation He will continue on anticoagulation therapy --continue Rivoraxaban 20mg   Left Upper Extremity Weakness Patient reports decreased ROM in LUE. No pain with active or passive motion. He is unable to undergo an MRI due to orthropnea. He is undergoing diuresis in hopes to lie flat to enough for MRI without symptoms. --ambulatory referral to Neurology   LOS: 1 day   Jacklynn Bue, Medical Student 12/16/2019, 10:54 AM

## 2019-12-16 NOTE — Discharge Instructions (Addendum)
Frank Morales,  It was a pleasure meeting you during your hospitalization. We are glad that you are doing much better. Upon discharge, please resume your home medication regimen as prescribed which we have attached to this summary. We would also like for you to follow-up with our clinic as soon as possible for hospital follow-up.   Sincerely, Dr. Fausto Skillern, MD   Information on my medicine - XARELTO (Rivaroxaban)  Why was Xarelto prescribed for you? Xarelto was prescribed for you to reduce the risk of a blood clot forming that can cause a stroke if you have a medical condition called atrial fibrillation (a type of irregular heartbeat).  What do you need to know about xarelto ? Take your Xarelto ONCE DAILY at the same time every day with your evening meal. If you have difficulty swallowing the tablet whole, you may crush it and mix in applesauce just prior to taking your dose.  Take Xarelto exactly as prescribed by your doctor and DO NOT stop taking Xarelto without talking to the doctor who prescribed the medication.  Stopping without other stroke prevention medication to take the place of Xarelto may increase your risk of developing a clot that causes a stroke.  Refill your prescription before you run out.  After discharge, you should have regular check-up appointments with your healthcare provider that is prescribing your Xarelto.  In the future your dose may need to be changed if your kidney function or weight changes by a significant amount.  What do you do if you miss a dose? If you are taking Xarelto ONCE DAILY and you miss a dose, take it as soon as you remember on the same day then continue your regularly scheduled once daily regimen the next day. Do not take two doses of Xarelto at the same time or on the same day.   Important Safety Information A possible side effect of Xarelto is bleeding. You should call your healthcare provider right away if you experience any of  the following: ? Bleeding from an injury or your nose that does not stop. ? Unusual colored urine (red or dark brown) or unusual colored stools (red or black). ? Unusual bruising for unknown reasons. ? A serious fall or if you hit your head (even if there is no bleeding).  Some medicines may interact with Xarelto and might increase your risk of bleeding while on Xarelto. To help avoid this, consult your healthcare provider or pharmacist prior to using any new prescription or non-prescription medications, including herbals, vitamins, non-steroidal anti-inflammatory drugs (NSAIDs) and supplements.  This website has more information on Xarelto: [email or web address]

## 2019-12-16 NOTE — Evaluation (Signed)
Physical Therapy Evaluation Patient Details Name: Frank Morales MRN: [id number] DOB: 07/16/1960 Today's Date: 12/16/2019   History of Present Illness  Pt is a 59 yo male with a medical history of HFpEF with EF 55-60% (06/2019), persistent atrial fibrillation (on Xarelto), chronic respiratory failure (on 3L [place]) hypertension, tobacco use and obesity who presented the Internal Medicine Resident clinic for progressively worsening leg swelling, and weight gain  Clinical Impression  Pt fully participated in session; pt is at baseline for strength and mobility; MD aware of LUE deficits; RN made aware of pt falling asleep on EOB causing a fall; Pt agreeable to education regarding safety and fall prevention with use of call bell as well as sitting in the recliner to sleep; no further acute PT needs noted    Follow Up Recommendations No PT follow up    Equipment Recommendations  None recommended by PT    Recommendations for Other Services       Precautions / Restrictions Precautions Precautions: Fall Restrictions Weight Bearing Restrictions: No      Mobility  Bed Mobility Overal bed mobility: Independent                Transfers Overall transfer level: Modified independent                  Ambulation/Gait Ambulation/Gait assistance: Modified independent (Device/Increase time) Gait Distance (Feet): 25 Feet         General Gait Details: pt with increased lateral weight shift and wide base of support; pt states he is at his baseline for strength and mobility  Stairs            Wheelchair Mobility    Modified Rankin (Stroke Patients Only)       Balance Overall balance assessment: Modified Independent                                           Pertinent Vitals/Pain Pain Assessment: No/denies pain    Home Living Family/patient expects to be discharged to:: Private residence Living Arrangements: Other relatives Available Help at  Discharge: Family;Available 24 hours/day Type of Home: House Home Access: Level entry     Home Layout: One level Home Equipment: Cane - single point Additional Comments: pt has compressor at home for O2    Prior Function Level of Independence: Independent         Comments: pt states he does limited ambulation throughout the day; he occasionally will use a cane; 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     Hand Dominance   Dominant Hand: Right    Extremity/Trunk Assessment   Upper Extremity Assessment Upper Extremity Assessment: LUE deficits/detail LUE Deficits / Details: pt able to perform shoulder flexion/abduction to 60 degrees with elevation noted to compensate; pt states there is no pain and has been going on for 6 months; MD aware    Lower Extremity Assessment Lower Extremity Assessment: Overall WFL for tasks assessed    Cervical / Trunk Assessment Cervical / Trunk Assessment: Kyphotic  Communication   Communication: No difficulties  Cognition Arousal/Alertness: Awake/alert Behavior During Therapy: WFL for tasks assessed/performed Overall Cognitive Status: Within Functional Limits for tasks assessed  General Comments: pt stated he sleeps a majority of the time in a chair, he stated last night he feel asslep on the EOB and fell but didn't tell anyone, PT alerted RN and PT informed pt to use recliner to prevent falls in the future      General Comments General comments (skin integrity, edema, etc.): pt able to perform balance and mobiltiy tasks and manage O2 line without assist or cueing from therapist    Exercises     Assessment/Plan    PT Assessment Patent does not need any further PT services  PT Problem List         PT Treatment Interventions      PT Goals (Current goals can be found in the Care Plan section)  Acute Rehab PT Goals Patient Stated Goal: "to get my shoulder working  again" PT Goal Formulation: With patient Time For Goal Achievement: 12/30/19 Potential to Achieve Goals: Good    Frequency     Barriers to discharge        Co-evaluation               AM-PAC PT "6 Clicks" Mobility  Outcome Measure Help needed turning from your back to your side while in a flat bed without using bedrails?: None Help needed moving from lying on your back to sitting on the side of a flat bed without using bedrails?: None Help needed moving to and from a bed to a chair (including a wheelchair)?: None Help needed standing up from a chair using your arms (e.g., wheelchair or bedside chair)?: None Help needed to walk in hospital room?: None Help needed climbing 3-5 steps with a railing? : None 6 Click Score: 24    End of Session Equipment Utilized During Treatment: Gait belt;Oxygen Activity Tolerance: Patient tolerated treatment well Patient left: in chair;with call bell/phone within reach Nurse Communication: Mobility status PT Visit Diagnosis: Other abnormalities of gait and mobility (R26.89)    Time: 6568-1275 PT Time Calculation (min) (ACUTE ONLY): 20 min   Charges:   PT Evaluation $PT Eval Low Complexity: [place], DPT Acute Rehabilitation Services [phone]  [place] 12/16/2019, 10:18 AM

## 2019-12-17 ENCOUNTER — Inpatient Hospital Stay (HOSPITAL_COMMUNITY): Payer: Medicaid Other

## 2019-12-17 LAB — CBC
HCT: 37.3 % — ABNORMAL LOW (ref 39.0–52.0)
Hemoglobin: 12.5 g/dL — ABNORMAL LOW (ref 13.0–17.0)
MCH: 32.3 pg (ref 26.0–34.0)
MCHC: 33.5 g/dL (ref 30.0–36.0)
MCV: 96.4 fL (ref 80.0–100.0)
Platelets: 201 10*3/uL (ref 150–400)
RBC: 3.87 MIL/uL — ABNORMAL LOW (ref 4.22–5.81)
RDW: 16.2 % — ABNORMAL HIGH (ref 11.5–15.5)
WBC: 4.6 10*3/uL (ref 4.0–10.5)
nRBC: 0 % (ref 0.0–0.2)

## 2019-12-17 LAB — COMPREHENSIVE METABOLIC PANEL
ALT: 17 U/L (ref 0–44)
AST: 18 U/L (ref 15–41)
Albumin: 2.8 g/dL — ABNORMAL LOW (ref 3.5–5.0)
Alkaline Phosphatase: 38 U/L (ref 38–126)
Anion gap: 11 (ref 5–15)
BUN: 5 mg/dL — ABNORMAL LOW (ref 6–20)
CO2: 37 mmol/L — ABNORMAL HIGH (ref 22–32)
Calcium: 9 mg/dL (ref 8.9–10.3)
Chloride: 88 mmol/L — ABNORMAL LOW (ref 98–111)
Creatinine, Ser: 0.66 mg/dL (ref 0.61–1.24)
GFR calc Af Amer: >60 mL/min (ref >60)
GFR calc non Af Amer: >60 mL/min (ref >60)
Glucose, Bld: 123 mg/dL — ABNORMAL HIGH (ref 70–99)
Potassium: 3.8 mmol/L (ref 3.5–5.1)
Sodium: 136 mmol/L (ref 135–145)
Total Bilirubin: 0.8 mg/dL (ref 0.3–1.2)
Total Protein: 6.2 g/dL — ABNORMAL LOW (ref 6.5–8.1)

## 2019-12-17 NOTE — Progress Notes (Signed)
Subjective: Frank Morales reports that he is feeling well this morning. He reports that he was able to sleep lying in bed without any dyspnea or cough. He believes that he would be able to lie flat for an MRI. He is still able to get up and walk around with no trouble. He has no concerns for today.   Objective:  Vital signs in last 24 hours: Vitals:   12/17/19 0103 12/17/19 0428 12/17/19 0802 12/17/19 0805  BP:  108/63 (!) 88/42 (!) 98/57  Pulse:  (!) 49 92 73  Resp:  18  18  Temp:  98.1 F (36.7 C)    TempSrc:  Oral    SpO2:  99% 100% 96%  Weight: (!) 146.1 kg      Weight change: -0.907 kg  Intake/Output Summary (Last 24 hours) at 12/17/2019 0917 Last data filed at 12/17/2019 8546 Gross per 24 hour  Intake 1537 ml  Output 2945 ml  Net -1408 ml   Physical Exam Constitutional:      General: He is not in acute distress.    Appearance: He is obese.     Comments: Sitting up in bed, conversant  HENT:     Head: Normocephalic and atraumatic.  Cardiovascular:     Rate and Rhythm: Normal rate and regular rhythm.     Heart sounds: Normal heart sounds. No murmur heard.  No gallop.   Pulmonary:     Effort: Pulmonary effort is normal. No respiratory distress.     Breath sounds: Normal breath sounds.  Musculoskeletal:     Right lower leg: 1+ Pitting Edema present.     Left lower leg: 1+ Pitting Edema present.  Neurological:     Mental Status: He is alert and oriented to person, place, and time.  Psychiatric:        Mood and Affect: Mood and affect normal.        Behavior: Behavior normal. Behavior is cooperative.        Thought Content: Thought content normal.      Assessment/Plan:  Principal Problem:   Acute exacerbation of CHF (congestive heart failure) (HCC) Active Problems:   Morbid obesity (HCC)   Hypertension   OSA (obstructive sleep apnea)   Permanent atrial fibrillation   Pleural effusion on right   Frank Morales is a 59 yo Morales with a medical history of HFpEF  with EF 55-60% (06/2019), persistent atrial fibrillation (on Xarelto), chronic respiratory failure (on 3L [place]) hypertension, tobacco use and obesity who presented progressively worsening leg swelling, and weight gain and hypoxia with exertion while on oxygen 3L [place] and admitted for acute exacerbation of congestive heart failure.  Acute on Chronic Heart Failure with Preserved EF: Presented with hypoxia, bilateral lower extremity edema and weight gain. We were diuresing with IV Lasix 80mg  BID and he has had a good response. Weight has decreased 1kg since admission and he has had net output of 1L. He reports that his dyspnea, leg swelling and orthopnea is improved. He is able sleep lying flat and breathe well on his normal 3L [place]. Renal function and electrolytes also remain stable. This is his 2nd hospitalization for an acute exacerbation of his CHF, he may benefit from an SGLT2 inhibitor, Empagliflozin, which has shown to decrease hospitalizations in patients with HFpEF. We discussed the the benefits and risks of this medication and he would like to speak with PCP about it. We will discharge him on his home dosage of Lasix, Metolazone and Potassium. --resume  PO Lasix 80mg  BID --resume Metolazone 2.5mg  MWF --resume Potassium 15mEq TID --continue home O2 3L  Chronic Respiratory Failure Patient is on 3L of Oxygen at home. He does not report any dyspnea at rest and is able to move around without too much dyspnea. He should continue his home oxygen. --continue 3L [address]  Atrial Fibrillation Currently taking Diltiazem and Rivaroxaban .  --continue Rivaroxaban 20mg  --continue Diltiazem 480mg  daily  OSA He does not use a CPAP, but uses home O2 at night. He may benefit from a CPAP in the future and should discuss/follow up with his PCP.   Left Upper Extremity Weakness He has had ongoing LUE weakness for about 6 months. He has reduced abduction and extension of left arm, but full passive range of motion. He  denies pain. After diuresis, he is now able to lie flat and undergo an MRI to rule out cervical etiology He has a referral to outpatient neurosurgery and should follow up. --follow up with outpatient Neurosurgery      LOS: 2 days   Jacklynn Bue, Medical Student 12/17/2019, 9:17 AM

## 2019-12-17 NOTE — Discharge Summary (Signed)
Name: Frank Morales MRN: [id number] DOB: [birth year] 59 y.o. PCP: [name], MD  Date of Admission: 12/15/2019  6:02 PM Date of Discharge: 12/17/2019 Attending Physician: Axel Filler, *  Discharge Diagnosis:  Principal Problem:   Acute exacerbation of CHF (congestive heart failure) ([place]) Active Problems:   Morbid obesity (HCC)   Hypertension   OSA (obstructive sleep apnea)   Permanent atrial fibrillation   Pleural effusion on right  Discharge Medications: Allergies as of 12/17/2019      Reactions   Lactose Intolerance (gi) Nausea And Vomiting      Medication List    TAKE these medications   azelastine 0.1 % nasal spray Commonly known as: ASTELIN INSTILL 2 SPRAYS INTO BOTH NOSTRILS TWICE DAILY What changed:   how much to take  how to take this  when to take this  additional instructions   diltiazem 240 MG 24 hr capsule Commonly known as: CARDIZEM CD Take 2 capsules (480 mg total) by mouth daily.   fluticasone 50 MCG/ACT nasal spray Commonly known as: FLONASE Place 1 spray into both nostrils daily.   furosemide 80 MG tablet Commonly known as: LASIX Take 1 tablet (80 mg total) by mouth 2 (two) times daily.   loratadine 10 MG tablet Commonly known as: CLARITIN TAKE 1 TABLET(10 MG) BY MOUTH DAILY What changed:   how much to take  how to take this  when to take this  additional instructions   metolazone 2.5 MG tablet Commonly known as: ZAROXOLYN Take 1 tablet (2.5 mg total) by mouth 3 (three) times a week. What changed: additional instructions   potassium chloride SA 20 MEQ tablet Commonly known as: KLOR-CON Take 40 mEq by mouth 3 (three) times daily.   rivaroxaban 20 MG Tabs tablet Commonly known as: Xarelto Take 1 tablet (20 mg total) by mouth daily with supper.      Disposition and follow-up:   Frank Morales was discharged from [facility] in Stable condition.  At the hospital follow up visit  please address:  1.  Patient's volume status and respiratory status, left arm weakness s/p MRI and neurosurgery referral, discuss possibly starting empagliflozin for HFpEF  2.  Labs / imaging needed at time of follow-up: BMP  3.  Pending labs/ test needing follow-up: none  Follow-up Appointments:  Follow-up Information    [name], MD. Schedule an appointment as soon as possible for a visit in 1 week(s).   Specialty: Internal Medicine Contact information: [address] [place] [address] [phone]        Fay Records, MD .   Specialty: Cardiology Contact information: [place] [address] [address] Hospital Course by problem list:  #Acute on Chronic Heart Failure with Preserved EF: Presented with hypoxia, bilateral lower extremity edema and weight gain. His last Echo (06/2019) showed EF 55-60% with normal LV function and mild LVH. We diuresed with IV Lasix 80mg  BID and he had a good response. Weight has decreased 1kg he had net output of 1L. He reported that his dyspnea, leg swelling and orthopnea improved. He is able sleep lying flat and breathe well on his normal 3L [place]. Renal function and electrolytes also remain stable. This is his 2nd hospitalization for an acute exacerbation of his CHF, he may benefit from an SGLT2 inhibitor, Empagliflozin, which has shown to decrease hospitalizations in patients  with HFpEF. We discussed the the benefits and risks of this medication and he would like to speak with PCP about it. We discharged on Lasix 80mg  BID and Metolazone 2.5mg  MWF, and KCl 56mEq TID.  #Chronic Respiratory Failure Patient has chronic dyspnea at rest and on exertion. Use 3L of O2 via nasal canal. He reports that he has not having any worsening dyspnea and never requires more than 3L. At clinic, his O2 saturation was 86% with ambulation. On physical exam he is saturating well at rest and is not any  respiratory distress,and lungs are clear to auscultation With his smoking history, his symptoms could be significant for obstructive lung disease and will need further evaluation. He should continue his home oxygen and follow up in the outpatient setting.  #Atrial Fibrillation Currently taking Diltiazem 480mg  for rate control and Rivaroxaban 20mg  for anticoagulation. We held his CCB for 24hrs and resumed the rest of his admission. He will be discharged on home regimen and should follow up in the outpatient setting.  #Hypertension Currently taking Diltiazem 480mg . His BP remained stable during admission. He should continue follow up and BP management with PCP.  #OSA Patient reports he uses supplemental O2 at night. He may benefit from a CPAP in the future and should discuss/follow up with his PCP.   #Left Upper Extremity Weakness He has had ongoing LUE weakness for about 6 months. He has reduced abduction and extension of left arm, but full passive range of motion. He denies pain. After diuresis, he is now able to lie flat and undergo an MRI to rule out cervical etiology. He has a referral to outpatient neurosurgery and should follow up.  Discharge Vitals: BP 106/67 (BP Location: Left Arm)   Pulse (!) 54   Temp 98.1 F (36.7 C)   Resp 17   Wt (!) 146.1 kg   SpO2 95%   BMI 44.92 kg/m   Pertinent Labs, Studies, and Procedures: CBC Latest Ref Rng & Units 12/17/2019 12/16/2019 12/15/2019  WBC 4.0 - 10.5 K/uL 4.6 4.5 6.2  Hemoglobin 13.0 - 17.0 g/dL 12.5(L) 12.4(L) 12.7(L)  Hematocrit 39 - 52 % 37.3(L) 37.5(L) 37.9(L)  Platelets 150 - 400 K/uL 201 201 223   BMP Latest Ref Rng & Units 12/17/2019 12/16/2019 12/15/2019  Glucose 70 - 99 mg/dL 123(H) 180(H) 101(H)  BUN 6 - 20 mg/dL 5(L) 9 10  Creatinine 0.61 - 1.24 mg/dL 0.66 0.66 0.69  BUN/Creat Ratio 9 - 20 - - -  Sodium 135 - 145 mmol/L 136 134(L) 131(L)  Potassium 3.5 - 5.1 mmol/L 3.8 3.5 3.2(L)  Chloride 98 - 111 mmol/L 88(L) 87(L) 87(L)    CO2 22 - 32 mmol/L 37(H) 38(H) 35(H)  Calcium 8.9 - 10.3 mg/dL 9.0 9.0 9.3   X-ray chest PA and lateral  Result Date: 12/15/2019 CLINICAL DATA:  Shortness of breath, AFib EXAM: CHEST - 2 VIEW COMPARISON:  10/21/2019, 04/28/2018, 07/02/2019 FINDINGS: Small right effusion. Enlarged cardiomediastinal silhouette. Negative for edema, focal airspace disease or pneumothorax. IMPRESSION: Cardiomegaly with small right pleural effusion. Electronically Signed   By: Donavan Foil M.D.   On: 12/15/2019 20:01   MR CERVICAL SPINE WO CONTRAST  Addendum Date: 12/17/2019   ADDENDUM REPORT: 12/17/2019 13:13 ADDENDUM: Findings omitted from impression. Intermittent loss of expected flow void within the V2 left vertebral artery, which may reflect underlying high-grade stenoses within this vessel. These results will be called to the ordering clinician or representative by the Radiologist Assistant, and communication documented in the  PACS or [employer]. Electronically Signed   By: Kellie Simmering DO   On: 12/17/2019 13:13   Result Date: 12/17/2019 CLINICAL DATA:  Cervical radiculopathy, no red flags. Additional history provided: Left arm strength continues to decrease, symptoms began 6 months ago, tingling in right fifth finger. EXAM: MRI CERVICAL SPINE WITHOUT CONTRAST TECHNIQUE: Multiplanar, multisequence MR imaging of the cervical spine was performed. No intravenous contrast was administered. COMPARISON:  Radiographs of the cervical spine 10/22/2019. FINDINGS: Mild-to-moderate intermittent motion degradation. Alignment: Reversal of the expected cervical lordosis. 2 mm C3-C4 grade 1 retrolisthesis. Vertebrae: Vertebral body height is maintained. Multilevel degenerative endplate irregularity. Fatty degenerative endplate marrow signal greatest at C5-C6 and C6-C7. Trace degenerative endplate edema is also present at these levels. Cord: No spinal cord signal abnormality. Posterior Fossa, vertebral arteries, paraspinal  tissues: No abnormality identified within included portions of the posterior fossa. Left maxillary sinus mucous retention cyst. Intermittent loss of expected flow void within the V2 left vertebral artery. Paraspinal soft tissues within normal limits Disc levels: Mild C2-C3 disc degeneration. Moderate disc degeneration at the C3-C4 through C6-C7 levels. C2-C3: No significant disc herniation. Mild uncinate and facet hypertrophy. No significant spinal canal stenosis. Mild left neural foraminal narrowing. C3-C4: Grade 1 retrolisthesis. Disc bulge. Bilateral disc osteophyte ridge/uncinate hypertrophy. Facet hypertrophy. Moderate spinal canal stenosis with the disc bulge contacting the ventral spinal cord. Bilateral neural foraminal narrowing (moderate right, moderate/severe left) C4-C5: Disc bulge. Bilateral disc osteophyte ridge/uncinate hypertrophy. Facet hypertrophy. Moderate spinal canal stenosis with the disc bulge contacting the ventral spinal cord. Bilateral neural foraminal narrowing (moderate right, moderate/severe left). C5-C6: Posterior disc osteophyte complex. Uncovertebral, facet and ligamentum flavum hypertrophy. Moderate spinal canal stenosis with contact upon the dorsal and ventral spinal cord. Severe bilateral neural foraminal narrowing. C6-C7: Posterior disc osteophyte complex. Uncinate, facet and ligamentum flavum hypertrophy. Mild spinal canal stenosis. Bilateral neural foraminal narrowing (moderate right, mild left). C7-T1: No significant disc herniation or stenosis. IMPRESSION: Cervical spondylosis as outlined. Moderate spinal canal stenosis at the C3-C4, C4-C5 and C5-C6 levels. No more than mild spinal canal narrowing at the remaining levels. Multilevel neural foraminal narrowing greatest bilaterally at C3-C4 (moderate right, moderate/severe left), bilaterally at C4-C5 (moderate right, moderate/severe left), bilaterally at C5-C6 (severe) and on the right at C6-C7 (moderate). Also of note, there is  moderate disc degeneration with degenerative endplate irregularity and mild degenerative endplate edema at Y2-Q8 and C6-C7. Electronically Signed: By: Kellie Simmering DO On: 12/17/2019 11:40   Discharge Instructions: Discharge Instructions    Diet - low sodium heart healthy   Complete by: As directed    Increase activity slowly   Complete by: As directed      Signed: Cato Mulligan, MD 12/17/2019, 2:54 PM   Pager: [phone]

## 2019-12-20 ENCOUNTER — Telehealth: Payer: Self-pay | Admitting: Internal Medicine

## 2019-12-20 NOTE — Telephone Encounter (Signed)
Returned pt's call to let him know he is able to see APP sooner than 12/3 appt with MD. Pt has been scheduled to see APP this week. He is aware of this appt.

## 2019-12-20 NOTE — Telephone Encounter (Signed)
[facility] FOR 12/21/2019 @ 3:45 PM PER DR Wynetta Emery.

## 2019-12-20 NOTE — Telephone Encounter (Signed)
° ° °  Pt was in the hospital and need a f/u with Dr. Harrington Challenger. Gave first available appt and Pt scheduled on 12/03. He said to ask Dr. Harrington Challenger if its ok for him to see APP if he needs to be seen sooner

## 2019-12-21 ENCOUNTER — Encounter: Payer: Medicaid Other | Admitting: Internal Medicine

## 2019-12-22 ENCOUNTER — Ambulatory Visit (INDEPENDENT_AMBULATORY_CARE_PROVIDER_SITE_OTHER): Payer: Medicaid Other | Admitting: Physician Assistant

## 2019-12-22 ENCOUNTER — Encounter: Payer: Self-pay | Admitting: Internal Medicine

## 2019-12-22 ENCOUNTER — Other Ambulatory Visit: Payer: Self-pay

## 2019-12-22 ENCOUNTER — Ambulatory Visit (INDEPENDENT_AMBULATORY_CARE_PROVIDER_SITE_OTHER): Payer: Medicaid Other | Admitting: Internal Medicine

## 2019-12-22 ENCOUNTER — Encounter: Payer: Self-pay | Admitting: Physician Assistant

## 2019-12-22 VITALS — BP 106/61 | HR 73 | Wt 327.7 lb

## 2019-12-22 VITALS — BP 118/68 | HR 100 | Ht 71.0 in | Wt 324.1 lb

## 2019-12-22 DIAGNOSIS — I5032 Chronic diastolic (congestive) heart failure: Secondary | ICD-10-CM

## 2019-12-22 DIAGNOSIS — I4821 Permanent atrial fibrillation: Secondary | ICD-10-CM

## 2019-12-22 DIAGNOSIS — R062 Wheezing: Secondary | ICD-10-CM | POA: Diagnosis present

## 2019-12-22 DIAGNOSIS — F17200 Nicotine dependence, unspecified, uncomplicated: Secondary | ICD-10-CM

## 2019-12-22 DIAGNOSIS — G4733 Obstructive sleep apnea (adult) (pediatric): Secondary | ICD-10-CM

## 2019-12-22 DIAGNOSIS — F17210 Nicotine dependence, cigarettes, uncomplicated: Secondary | ICD-10-CM | POA: Diagnosis not present

## 2019-12-22 DIAGNOSIS — I4891 Unspecified atrial fibrillation: Secondary | ICD-10-CM

## 2019-12-22 DIAGNOSIS — I10 Essential (primary) hypertension: Secondary | ICD-10-CM

## 2019-12-22 LAB — BASIC METABOLIC PANEL
BUN/Creatinine Ratio: 15 (ref 9–20)
BUN: 9 mg/dL (ref 6–24)
CO2: 32 mmol/L — ABNORMAL HIGH (ref 20–29)
Calcium: 9.2 mg/dL (ref 8.7–10.2)
Chloride: 91 mmol/L — ABNORMAL LOW (ref 96–106)
Creatinine, Ser: 0.62 mg/dL — ABNORMAL LOW (ref 0.76–1.27)
GFR calc Af Amer: 126 mL/min/{1.73_m2} (ref >59)
GFR calc non Af Amer: 109 mL/min/{1.73_m2} (ref >59)
Glucose: 102 mg/dL — ABNORMAL HIGH (ref 65–99)
Potassium: 3.7 mmol/L (ref 3.5–5.2)
Sodium: 137 mmol/L (ref 134–144)

## 2019-12-22 MED ORDER — ALBUTEROL SULFATE HFA 108 (90 BASE) MCG/ACT IN AERS: 2.0000 | INHALATION_SPRAY | Freq: Four times a day (QID) | RESPIRATORY_TRACT | 2 refills | Status: DC | PRN

## 2019-12-22 MED ORDER — DAPAGLIFLOZIN PROPANEDIOL 10 MG PO TABS: 10.0000 mg | ORAL_TABLET | Freq: Every day | ORAL | 2 refills | Status: DC

## 2019-12-22 NOTE — Progress Notes (Addendum)
CC: CHF  HPI:  Frank Morales is a 59 y.o. male with a past medical history stated below and presents today for follow-up for HFpEF exacerbation. Please see problem based assessment and plan for additional details.  Past Medical History:  Diagnosis Date  . Acute exacerbation of CHF (congestive heart failure) ([place]) 12/15/2019  . Acute heart failure ([place]) 12/16/2019  . Atrial fibrillation and flutter ([place]) 08/17/2015   A. S/p failed DCCV // b. Severe BAE on echo >> rate control strategy (has seen AF clinic) // c. Xarelto for anticoag (CHADS2-VASc=2 / CHF, HTN)  . Chronic diastolic CHF (congestive heart failure) ([place]) 08/30/2015   A. Echo 6/17: Apical HK, moderate focal basal and mild concentric LVH, EF 50-55, diffuse HK, trivial MR, severe BAE, mild TR, PASP 37  . Dependence on continuous supplemental oxygen    3L  . Hepatitis C, chronic ([place]) 08/19/2015  . History of cardiac catheterization    a. LHC 6/17: LAD irregs, o/w no CAD  . Hypertension   . Hyposmia 12/22/2017  . OSA (obstructive sleep apnea) 11/21/2015   No Cpap  . Pleural effusion on right 03/14/2018  . Sleep apnea   . Tobacco abuse     Current Outpatient Medications on File Prior to Visit  Medication Sig Dispense Refill  . azelastine (ASTELIN) 0.1 % nasal spray INSTILL 2 SPRAYS INTO BOTH NOSTRILS TWICE DAILY (Patient taking differently: Place 2 sprays into both nostrils 2 (two) times daily. ) 90 mL 1  . diltiazem (CARDIZEM CD) 240 MG 24 hr capsule Take 2 capsules (480 mg total) by mouth daily. 180 capsule 1  . fluticasone (FLONASE) 50 MCG/ACT nasal spray Place 1 spray into both nostrils daily. 16 g 2  . furosemide (LASIX) 80 MG tablet Take 1 tablet (80 mg total) by mouth 2 (two) times daily. 180 tablet 1  . loratadine (CLARITIN) 10 MG tablet TAKE 1 TABLET(10 MG) BY MOUTH DAILY (Patient taking differently: Take 10 mg by mouth daily. ) 90 tablet 3  . metolazone (ZAROXOLYN) 2.5 MG tablet Take 1 tablet (2.5 mg total) by mouth  3 (three) times a week. (Patient taking differently: Take 2.5 mg by mouth 3 (three) times a week. Monday, Wednesday, Friday) 12 tablet 1  . potassium chloride SA (KLOR-CON) 20 MEQ tablet Take 40 mEq by mouth 3 (three) times daily.    . rivaroxaban (XARELTO) 20 MG TABS tablet Take 1 tablet (20 mg total) by mouth daily with supper. 90 tablet 1   No current facility-administered medications on file prior to visit.    Family History  Problem Relation Age of Onset  . Hypertension Mother   . Diabetes Mother   . Pneumonia Father   . Hypertension Maternal Grandfather   . Colon cancer Neg Hx     Social History   Socioeconomic History  . Marital status: Single    Spouse name: Not on file  . Number of children: Not on file  . Years of education: Not on file  . Highest education level: Not on file  Occupational History  . Occupation: unemployed    Comment: Previous Training and development officer   Tobacco Use  . Smoking status: Current Every Day Smoker    Packs/day: 0.20    Types: Cigarettes  . Smokeless tobacco: Never Used  . Tobacco comment: 3-4cigs/day  Vaping Use  . Vaping Use: Never used  Substance and Sexual Activity  . Alcohol use: Yes    Alcohol/week: 5.0 standard drinks    Types:  5 Standard drinks or equivalent per week    Comment: 2 times a week beer  . Drug use: No  . Sexual activity: Not on file  Other Topics Concern  . Not on file  Social History Narrative  . Not on file   Social Determinants of Health   Financial Resource Strain:   . Difficulty of Paying Living Expenses: Not on file  Food Insecurity:   . Worried About Charity fundraiser in the Last Year: Not on file  . Ran Out of Food in the Last Year: Not on file  Transportation Needs:   . Lack of Transportation (Medical): Not on file  . Lack of Transportation (Non-Medical): Not on file  Physical Activity:   . Days of Exercise per Week: Not on file  . Minutes of Exercise per Session: Not on file  Stress:   . Feeling of Stress :  Not on file  Social Connections:   . Frequency of Communication with Friends and Family: Not on file  . Frequency of Social Gatherings with Friends and Family: Not on file  . Attends Religious Services: Not on file  . Active Member of Clubs or Organizations: Not on file  . Attends Archivist Meetings: Not on file  . Marital Status: Not on file  Intimate Partner Violence:   . Fear of Current or Ex-Partner: Not on file  . Emotionally Abused: Not on file  . Physically Abused: Not on file  . Sexually Abused: Not on file    Review of Systems: ROS negative except for what is noted on the assessment and plan.  Vitals:   12/22/19 1407  BP: 106/61  Pulse: 73  SpO2: 99%  Weight: (!) 327 lb 11.2 oz (148.6 kg)     Physical Exam: Physical Exam Constitutional:      Appearance: Normal appearance.  HENT:     Head: Normocephalic and atraumatic.  Eyes:     Extraocular Movements: Extraocular movements intact.  Cardiovascular:     Rate and Rhythm: Normal rate.     Pulses: Normal pulses.     Heart sounds: Normal heart sounds.  Pulmonary:     Effort: Pulmonary effort is normal.     Breath sounds: Normal breath sounds.  Abdominal:     General: Bowel sounds are normal.     Palpations: Abdomen is soft.     Tenderness: There is no abdominal tenderness.  Musculoskeletal:        General: Normal range of motion.     Cervical back: Normal range of motion.     Right lower leg: No edema.     Left lower leg: No edema.  Skin:    General: Skin is warm and dry.  Neurological:     Mental Status: He is alert and oriented to person, place, and time. Mental status is at baseline.  Psychiatric:        Mood and Affect: Mood normal.      Assessment & Plan:   See Encounters Tab for problem based charting.  Patient discussed with Dr. Jimmye Norman   Internal Medicine Clinic Attending  Case discussed with Dr. Marianna Payment  at the time of the visit.  We reviewed the resident's history and exam and  pertinent patient test results.  I agree with the assessment, diagnosis, and plan of care documented in the resident's note. Dorian Pod MD   Marianna Payment, D.O. [facility], PGY-2 Pager: [phone], Phone: [phone] Date 12/23/2019 Time 1:21 PM

## 2019-12-22 NOTE — Progress Notes (Signed)
Cardiology Office Note    Date:  12/22/2019   ID:  Frank Morales, DOB [birth year], MRN [id number]  PCP:  [name], MD  Cardiologist:  Dr. Harrington Challenger  Chief Complaint: Hospital follow up  History of Present Illness:   Frank Morales is a 59 y.o. male chronic diastolic heart failure, obstructive sleep apnea, morbid obesity, persistent atrial fibrillation/flutter, chronic respiratory failure, on 3 L oxygen 24/7, hypertension, substance abuse presented for hospital follow-up.  He was admitted in 6/17 with newly diagnosed CHF and atrial flutter. Cardiac catheterization at that time demonstrated no significant CAD. Echocardiogram demonstrated EF 50-55%.He is on Xarelto for anticoagulation. He underwent DCCV in 7/17 but reverted back to NSR.  Echocardiogram April 2021 showed LV function of 55 to 60%, no wall motion abnormalities, no significant valvular abnormality.  Mildly dilated ascending aorta at 39 mm.  Several admission for heart failure exacerbation, most recently admitted September 2021.  He was diuresed and discharged.  Patient is here for follow-up.  Reports stable weight.  States that he does use low-sodium diet.  Noted 3 to 4 pound weight loss on day of metolazone.  Breathing stable on 3 L oxygen.  Has chronic lower extremity edema.  Sleeping well.  No chest pain, palpitation, dizziness, melena or blood in the stool or urine.  Reports compliance with medications.   Past Medical History:  Diagnosis Date  . Atrial fibrillation and flutter ([place]) 08/17/2015   A. S/p failed DCCV // b. Severe BAE on echo >> rate control strategy (has seen AF clinic) // c. Xarelto for anticoag (CHADS2-VASc=2 / CHF, HTN)  . Chronic diastolic CHF (congestive heart failure) ([place]) 08/30/2015   A. Echo 6/17: Apical HK, moderate focal basal and mild concentric LVH, EF 50-55, diffuse HK, trivial MR, severe BAE, mild TR, PASP 37  . Dependence on continuous supplemental oxygen    3L  . Hepatitis C,  chronic ([place]) 08/19/2015  . History of cardiac catheterization    a. LHC 6/17: LAD irregs, o/w no CAD  . Hypertension   . OSA (obstructive sleep apnea) 11/21/2015   No Cpap  . Sleep apnea   . Tobacco abuse     Past Surgical History:  Procedure Laterality Date  . CARDIAC CATHETERIZATION N/A 08/21/2015   Procedure: Right/Left Heart Cath and Coronary Angiography;  Surgeon: Leonie Man, MD;  Location: [facility];  Service: Cardiovascular;  Laterality: N/A;  . CARDIOVERSION N/A 09/22/2015   Procedure: CARDIOVERSION;  Surgeon: Josue Hector, MD;  Location: [facility] ENDOSCOPY;  Service: Cardiovascular;  Laterality: N/A;  . COLONOSCOPY N/A 07/19/2016   Procedure: COLONOSCOPY;  Surgeon: Doran Stabler, MD;  Location: [facility] ENDOSCOPY;  Service: Gastroenterology;  Laterality: N/A;  . ESOPHAGOGASTRODUODENOSCOPY N/A 07/19/2016   Procedure: ESOPHAGOGASTRODUODENOSCOPY (EGD);  Surgeon: Doran Stabler, MD;  Location: [facility];  Service: Gastroenterology;  Laterality: N/A;  . IR THORACENTESIS ASP PLEURAL SPACE W/IMG GUIDE  07/09/2019  . NO PAST SURGERIES    . SINUS ENDO WITH FUSION N/A 10/02/2018   Procedure: SINUS ENDO WITH FUSION;  Surgeon: Melissa Montane, MD;  Location: [facility];  Service: ENT;  Laterality: N/A;    Current Medications: Prior to Admission medications   Medication Sig Start Date End Date Taking? Authorizing Provider  azelastine (ASTELIN) 0.1 % nasal spray INSTILL 2 SPRAYS INTO BOTH NOSTRILS TWICE DAILY Patient taking differently: Place 2 sprays into both nostrils 2 (two) times daily.  06/22/19  Yes Sid Falcon, MD  diltiazem (CARDIZEM CD) 240 MG 24 hr capsule Take 2 capsules (480 mg total) by mouth daily. 11/05/19  Yes [name], MD  fluticasone (FLONASE) 50 MCG/ACT nasal spray Place 1 spray into both nostrils daily. 06/22/19  Yes Sid Falcon, MD  furosemide (LASIX) 80 MG tablet Take 1 tablet (80 mg total) by mouth 2 (two) times daily. 11/05/19  Yes [name], MD    loratadine (CLARITIN) 10 MG tablet TAKE 1 TABLET(10 MG) BY MOUTH DAILY Patient taking differently: Take 10 mg by mouth daily.  11/05/19  Yes [name], MD  potassium chloride SA (KLOR-CON) 20 MEQ tablet Take 40 mEq by mouth 3 (three) times daily. 12/04/19  Yes [provider]  rivaroxaban (XARELTO) 20 MG TABS tablet Take 1 tablet (20 mg total) by mouth daily with supper. 11/05/19  Yes [name], MD  metolazone (ZAROXOLYN) 2.5 MG tablet Take 1 tablet (2.5 mg total) by mouth 3 (three) times a week. Patient taking differently: Take 2.5 mg by mouth 3 (three) times a week. Monday, Wednesday, Friday 11/05/19 12/16/19  [name], MD    Allergies:   Lactose intolerance (gi)   Social History   Socioeconomic History  . Marital status: Single    Spouse name: Not on file  . Number of children: Not on file  . Years of education: Not on file  . Highest education level: Not on file  Occupational History  . Occupation: unemployed    Comment: Previous Training and development officer   Tobacco Use  . Smoking status: Current Every Day Smoker    Packs/day: 0.20    Types: Cigarettes  . Smokeless tobacco: Never Used  . Tobacco comment: 2-3 per day   Vaping Use  . Vaping Use: Never used  Substance and Sexual Activity  . Alcohol use: Yes    Alcohol/week: 5.0 standard drinks    Types: 5 Standard drinks or equivalent per week    Comment: 2 times a week beer  . Drug use: No  . Sexual activity: Not on file  Other Topics Concern  . Not on file  Social History Narrative  . Not on file   Social Determinants of Health   Financial Resource Strain:   . Difficulty of Paying Living Expenses: Not on file  Food Insecurity:   . Worried About Charity fundraiser in the Last Year: Not on file  . Ran Out of Food in the Last Year: Not on file  Transportation Needs:   . Lack of Transportation (Medical): Not on file  . Lack of Transportation (Non-Medical): Not on file  Physical Activity:   . Days of  Exercise per Week: Not on file  . Minutes of Exercise per Session: Not on file  Stress:   . Feeling of Stress : Not on file  Social Connections:   . Frequency of Communication with Friends and Family: Not on file  . Frequency of Social Gatherings with Friends and Family: Not on file  . Attends Religious Services: Not on file  . Active Member of Clubs or Organizations: Not on file  . Attends Archivist Meetings: Not on file  . Marital Status: Not on file     Family History:  The patient's family history includes Diabetes in his mother; Hypertension in his maternal grandfather and mother; Pneumonia in his father.   ROS:   Please see the history of present illness.    ROS All other systems reviewed and are negative.  PHYSICAL EXAM:   VS:  BP 118/68   Pulse 100   Ht 5\' 11"  (1.803 m)   Wt (!) 324 lb 1.8 oz (147 kg)   SpO2 95%   BMI 45.20 kg/m    GEN: Well nourished, well developed, in no acute distress  HEENT: normal  Neck: no JVD, carotid bruits, or masses Cardiac: RRR; no murmurs, rubs, or gallops, 1-2+ bilateral lower extremity edema with compression stockings   Respiratory:  clear to auscultation bilaterally, normal work of breathing GI: soft, nontender, nondistended, + BS MS: no deformity or atrophy  Skin: warm and dry, no rash Neuro:  Alert and Oriented x 3, Strength and sensation are intact Psych: euthymic mood, full affect  Wt Readings from Last 3 Encounters:  12/22/19 (!) 324 lb 1.8 oz (147 kg)  12/17/19 (!) 322 lb 1.6 oz (146.1 kg)  12/15/19 (!) 324 lb 12.8 oz (147.3 kg)      Studies/Labs Reviewed:   EKG:  EKG is not ordered today.   Recent Labs: 12/15/2019: B Natriuretic Peptide 120.4; Magnesium 1.9 12/17/2019: ALT 17; BUN 5; Creatinine, Ser 0.66; Hemoglobin 12.5; Platelets 201; Potassium 3.8; Sodium 136   Lipid Panel    Component Value Date/Time   CHOL 151 12/16/2019 0807   TRIG 77 12/16/2019 0807   HDL 39 (L) 12/16/2019 0807   CHOLHDL 3.9  12/16/2019 0807   VLDL 15 12/16/2019 0807   LDLCALC 97 12/16/2019 0807    Additional studies/ records that were reviewed today include:   Echocardiogram: 06/2019  1. Left ventricular ejection fraction, by estimation, is 55 to 60%. The  left ventricle has normal function. The left ventricle has no regional  wall motion abnormalities. There is mild concentric left ventricular  hypertrophy. Left ventricular diastolic  function could not be evaluated.  2. Right ventricular systolic function was not well visualized. The right  ventricular size is not well visualized.  3. The mitral valve is normal in structure. Trivial mitral valve  regurgitation. No evidence of mitral stenosis.  4. The aortic valve is grossly normal. Aortic valve regurgitation is not  visualized. No aortic stenosis is present.  5. Aortic dilatation noted. There is mild dilatation of the ascending  aorta measuring 39 mm.  6. The inferior vena cava is dilated in size with >50% respiratory  variability, suggesting right atrial pressure of 8 mmHg.      ASSESSMENT & PLAN:    1. Chronic diastolic heart failure Patient reports stable weight and symptoms on current dose of diuretic.  No change.  Noted improved symptoms after metolazone.  Check BMP today.  2.  Chronic respiratory failure, hypoxic -On 3 L oxygen chronically  3.  Persistent atrial fibrillation/flutter -Continue Cardizem CD and Xarelto at current dose -No bleeding issue  4.  Hypertension -Blood pressure stable on current medications    Medication Adjustments/Labs and Tests Ordered: Current medicines are reviewed at length with the patient today.  Concerns regarding medicines are outlined above.  Medication changes, Labs and Tests ordered today are listed in the Patient Instructions below. Patient Instructions  Medication Instructions:  Your physician recommends that you continue on your current medications as directed. Please refer to the  Current Medication list given to you today.  *If you need a refill on your cardiac medications before your next appointment, please call your pharmacy*   Lab Work: TODAY:  BMET  If you have labs (blood work) drawn today and your tests are completely normal, you will receive your results  only by: Marland Kitchen MyChart Message (if you have MyChart) OR . A paper copy in the mail If you have any lab test that is abnormal or we need to change your treatment, we will call you to review the results.   Testing/Procedures: None ordered   Follow-Up: At [facility], you and your health needs are our priority.  As part of our continuing mission to provide you with exceptional heart care, we have created designated Provider Care Teams.  These Care Teams include your primary Cardiologist (physician) and Advanced Practice Providers (APPs -  Physician Assistants and Nurse Practitioners) who all work together to provide you with the care you need, when you need it.  We recommend signing up for the patient portal called "MyChart".  Sign up information is provided on this After Visit Summary.  MyChart is used to connect with patients for Virtual Visits (Telemedicine).  Patients are able to view lab/test results, encounter notes, upcoming appointments, etc.  Non-urgent messages can be sent to your provider as well.   To learn more about what you can do with MyChart, go to [email or web address].    Your next appointment:   2 month(s)  Keep already scheduled appt with Dr. Harrington Challenger in December   The format for your next appointment:   In Person  Provider:   Dorris Carnes, MD   Other Instructions      Signed, Leanor Kail, [place]  12/22/2019 11:18 AM    [address], [place], [place]  [address] Phone: [phone]; Fax: [phone]

## 2019-12-22 NOTE — Patient Instructions (Signed)
Medication Instructions:  Your physician recommends that you continue on your current medications as directed. Please refer to the Current Medication list given to you today.  *If you need a refill on your cardiac medications before your next appointment, please call your pharmacy*   Lab Work: TODAY:  BMET  If you have labs (blood work) drawn today and your tests are completely normal, you will receive your results only by: Marland Kitchen MyChart Message (if you have MyChart) OR . A paper copy in the mail If you have any lab test that is abnormal or we need to change your treatment, we will call you to review the results.   Testing/Procedures: None ordered   Follow-Up: At [facility], you and your health needs are our priority.  As part of our continuing mission to provide you with exceptional heart care, we have created designated Provider Care Teams.  These Care Teams include your primary Cardiologist (physician) and Advanced Practice Providers (APPs -  Physician Assistants and Nurse Practitioners) who all work together to provide you with the care you need, when you need it.  We recommend signing up for the patient portal called "MyChart".  Sign up information is provided on this After Visit Summary.  MyChart is used to connect with patients for Virtual Visits (Telemedicine).  Patients are able to view lab/test results, encounter notes, upcoming appointments, etc.  Non-urgent messages can be sent to your provider as well.   To learn more about what you can do with MyChart, go to [email or web address].    Your next appointment:   2 month(s)  Keep already scheduled appt with Dr. Harrington Challenger in December   The format for your next appointment:   In Person  Provider:   Dorris Carnes, MD   Other Instructions

## 2019-12-22 NOTE — Patient Instructions (Addendum)
Thank you, Mr.[place] for allowing [place] to provide your care today. Today we discussed Heart Failure, COPD.    I have ordered the following labs for you:  Lab Orders  No laboratory test(s) ordered today     I will call if any are abnormal. All of your labs can be accessed through "My Chart".  I have place a referrals to Heart Failure at home program/Chronic Care Management.  I have ordered the following tests: Pulmonary function testing   I have ordered the following medication/changed the following medications:  1. begin Dapagliflozin 10 mg daily at breakfast 2. Continue all other medications  3. Consider restarting CPAP for sleep apnea 4. Albuterol inhaler 1-2 puffs every 6 hours as needed.  Please follow-up in 2 weeks.  Should you have any questions or concerns please call the internal medicine clinic at [phone].    Marianna Payment, D.O. [facility]   My Chart Access: [email or web address]   If you have not already done so, please get your COVID 19 vaccine  To schedule an appointment for a COVID vaccine choice any of the following: Go to [email or web address]   Go to [email or web address]                  Call [phone]                                     Call [phone] and select Option 2

## 2019-12-23 ENCOUNTER — Encounter: Payer: Self-pay | Admitting: Internal Medicine

## 2019-12-23 DIAGNOSIS — M5412 Radiculopathy, cervical region: Secondary | ICD-10-CM | POA: Diagnosis not present

## 2019-12-23 NOTE — Assessment & Plan Note (Signed)
Patient presents to the clinic today for hospital follow-up for his most recent admission for CHF exacerbation.  His recent echo (06/2019) showed EF 55-60%. Patient states that he is feeling well today. He denies chest pain, shortness of breath, orthopnea, significant lower extremity edema and is on his home oxygen requirement of 3 L nasal cannula.Frank Morales  He was 327 pounds at this office visit.  There seems to be some discrepancy on his dry weight.  He was previously admitted with signs and symptoms of hypervolemia with a weight of 327 pounds.  Patient is currently taking 80 mg twice daily and metolazone 2.5 mg 3 times weekly.  He admits to good urine output particularly on days when he takes metolazone.  On evaluation the patient has diffuse expiratory wheezing without rales.  He has 1+ pitting edema to his knees and no JVD.  Patient has most recently had 2 admissions for HFpEF exacerbation. For that reason we will start him on an SGLT2 inhibitor today.    Plan: - Start dapaglifozin 10 mg daily  - Repeat BMP today

## 2019-12-23 NOTE — Progress Notes (Signed)
Internal Medicine Clinic Attending  Case discussed with Dr. Seawell  At the time of the visit.  We reviewed the resident's history and exam and pertinent patient test results.  I agree with the assessment, diagnosis, and plan of care documented in the resident's note.  

## 2019-12-23 NOTE — Assessment & Plan Note (Signed)
Patient has a history of atrial fibrillation on Xarelto 20 mg daily.  Patient states that he is tolerating this medication well without any signs or symptoms of bleeding.  Plan: -Continue Xarelto 20 mg daily

## 2019-12-23 NOTE — Assessment & Plan Note (Signed)
I counseled the patient regarding the importance of reconsidering CPAP use for his obstructive sleep apnea.  This particular important with his underlying HFpEF and atrial fibrillation.  Patient admits understanding and states that he will reconsider using CPAP in the future  Plan -Continue to encourage CPAP use

## 2019-12-23 NOTE — Assessment & Plan Note (Deleted)
Patient has a history of atrial fibrillation on Xarelto 20 mg daily.  Patient states that he is tolerating this medication well without any signs or symptoms of bleeding.  Plan: -Continue Xarelto 20 mg daily

## 2019-12-23 NOTE — Assessment & Plan Note (Signed)
Patient has a 40-pack-year history of smoking.  He is currently on 3 L nasal cannula for supplemental oxygen.  He admits to never having formal pulmonary function testing done in the past.  On exam patient does present with bilateral expiratory wheezing concerning for COPD.  Considering his history of smoking I counseled him on smoking cessation and offered him Chantix.  He states that he will consider this in the future.   Plan: -We will give albuterol metered-dose inhaler today -We will set the patient up for pulmonary function testing. -He will need to follow-up with our clinic once the testing has been performed to adjust medications

## 2019-12-30 ENCOUNTER — Telehealth: Payer: Self-pay | Admitting: *Deleted

## 2019-12-30 NOTE — Telephone Encounter (Signed)
Jarrett Soho, Med Tech with [facility], called stating she would be faxing a form to be completed for medical clearance as patient is on Xarelto. Patient last saw Dr. Marianna Payment on 12/22/2019. Will place in Red Team's box. Hubbard Hartshorn, BSN, RN-BC

## 2019-12-30 NOTE — Telephone Encounter (Signed)
I will fill this out as soon as we receive the paperwork.

## 2020-01-05 ENCOUNTER — Ambulatory Visit (INDEPENDENT_AMBULATORY_CARE_PROVIDER_SITE_OTHER): Payer: Medicaid Other | Admitting: Internal Medicine

## 2020-01-05 VITALS — BP 105/72 | HR 63 | Temp 98.6°F | Ht 71.0 in | Wt 322.6 lb

## 2020-01-05 DIAGNOSIS — Z7901 Long term (current) use of anticoagulants: Secondary | ICD-10-CM

## 2020-01-05 DIAGNOSIS — M5412 Radiculopathy, cervical region: Secondary | ICD-10-CM

## 2020-01-05 DIAGNOSIS — Z01818 Encounter for other preprocedural examination: Secondary | ICD-10-CM

## 2020-01-05 NOTE — Progress Notes (Signed)
CC: Preop evaluation for left cervical radiculopathy  HPI:  [name] is a 59 y.o. male with a past medical history stated below and presents today for preoperative evaluation for left cervical radiculopathy. Please see problem based assessment and plan for additional details.  Past Medical History:  Diagnosis Date  . Acute exacerbation of CHF (congestive heart failure) ([place]) 12/15/2019  . Acute heart failure ([place]) 12/16/2019  . Atrial fibrillation and flutter ([place]) 08/17/2015   A. S/p failed DCCV // b. Severe BAE on echo >> rate control strategy (has seen AF clinic) // c. Xarelto for anticoag (CHADS2-VASc=2 / CHF, HTN)  . Chronic diastolic CHF (congestive heart failure) ([place]) 08/30/2015   A. Echo 6/17: Apical HK, moderate focal basal and mild concentric LVH, EF 50-55, diffuse HK, trivial MR, severe BAE, mild TR, PASP 37  . Dependence on continuous supplemental oxygen    3L  . Hepatitis C, chronic ([place]) 08/19/2015  . History of cardiac catheterization    a. LHC 6/17: LAD irregs, o/w no CAD  . Hypertension   . Hyposmia 12/22/2017  . OSA (obstructive sleep apnea) 11/21/2015   No Cpap  . Pleural effusion on right 03/14/2018  . Sleep apnea   . Tobacco abuse     Current Outpatient Medications on File Prior to Visit  Medication Sig Dispense Refill  . albuterol (VENTOLIN HFA) 108 (90 Base) MCG/ACT inhaler Inhale 2 puffs into the lungs every 6 (six) hours as needed for wheezing or shortness of breath. 8 g 2  . azelastine (ASTELIN) 0.1 % nasal spray INSTILL 2 SPRAYS INTO BOTH NOSTRILS TWICE DAILY (Patient taking differently: Place 2 sprays into both nostrils 2 (two) times daily. ) 90 mL 1  . dapagliflozin propanediol (FARXIGA) 10 MG TABS tablet Take 1 tablet (10 mg total) by mouth daily before breakfast. 30 tablet 2  . diltiazem (CARDIZEM CD) 240 MG 24 hr capsule Take 2 capsules (480 mg total) by mouth daily. 180 capsule 1  . fluticasone (FLONASE) 50 MCG/ACT nasal spray Place 1 spray  into both nostrils daily. 16 g 2  . furosemide (LASIX) 80 MG tablet Take 1 tablet (80 mg total) by mouth 2 (two) times daily. 180 tablet 1  . loratadine (CLARITIN) 10 MG tablet TAKE 1 TABLET(10 MG) BY MOUTH DAILY (Patient taking differently: Take 10 mg by mouth daily. ) 90 tablet 3  . metolazone (ZAROXOLYN) 2.5 MG tablet Take 1 tablet (2.5 mg total) by mouth 3 (three) times a week. (Patient taking differently: Take 2.5 mg by mouth 3 (three) times a week. Monday, Wednesday, Friday) 12 tablet 1  . potassium chloride SA (KLOR-CON) 20 MEQ tablet Take 40 mEq by mouth 3 (three) times daily.    . rivaroxaban (XARELTO) 20 MG TABS tablet Take 1 tablet (20 mg total) by mouth daily with supper. 90 tablet 1   No current facility-administered medications on file prior to visit.    Family History  Problem Relation Age of Onset  . Hypertension Mother   . Diabetes Mother   . Pneumonia Father   . Hypertension Maternal Grandfather   . Colon cancer Neg Hx     Social History   Socioeconomic History  . Marital status: Single    Spouse name: Not on file  . Number of children: Not on file  . Years of education: Not on file  . Highest education level: Not on file  Occupational History  . Occupation: unemployed    Comment: Previous Training and development officer  Tobacco Use  . Smoking status: Current Every Day Smoker    Packs/day: 0.20    Types: Cigarettes  . Smokeless tobacco: Never Used  . Tobacco comment: 3-4cigs/day  Vaping Use  . Vaping Use: Never used  Substance and Sexual Activity  . Alcohol use: Yes    Alcohol/week: 5.0 standard drinks    Types: 5 Standard drinks or equivalent per week    Comment: 2 times a week beer  . Drug use: No  . Sexual activity: Not on file  Other Topics Concern  . Not on file  Social History Narrative  . Not on file   Social Determinants of Health   Financial Resource Strain:   . Difficulty of Paying Living Expenses: Not on file  Food Insecurity:   . Worried About Ship broker in the Last Year: Not on file  . Ran Out of Food in the Last Year: Not on file  Transportation Needs:   . Lack of Transportation (Medical): Not on file  . Lack of Transportation (Non-Medical): Not on file  Physical Activity:   . Days of Exercise per Week: Not on file  . Minutes of Exercise per Session: Not on file  Stress:   . Feeling of Stress : Not on file  Social Connections:   . Frequency of Communication with Friends and Family: Not on file  . Frequency of Social Gatherings with Friends and Family: Not on file  . Attends Religious Services: Not on file  . Active Member of Clubs or Organizations: Not on file  . Attends Archivist Meetings: Not on file  . Marital Status: Not on file  Intimate Partner Violence:   . Fear of Current or Ex-Partner: Not on file  . Emotionally Abused: Not on file  . Physically Abused: Not on file  . Sexually Abused: Not on file    Review of Systems: ROS negative except for what is noted on the assessment and plan.  Vitals:   01/05/20 1440 01/05/20 1459  BP: (!) 119/41 105/72  Pulse: 93 63  Temp: 98.6 F (37 C)   TempSrc: Oral   SpO2: 98%   Weight: (!) 322 lb 9.6 oz (146.3 kg)   Height: 5\' 11"  (1.803 m)      Physical Exam: Physical Exam Constitutional:      Appearance: Normal appearance.  HENT:     Head: Normocephalic and atraumatic.  Eyes:     Extraocular Movements: Extraocular movements intact.  Cardiovascular:     Rate and Rhythm: Normal rate.     Pulses: Normal pulses.     Heart sounds: Normal heart sounds.  Pulmonary:     Effort: Pulmonary effort is normal.     Breath sounds: Normal breath sounds.  Abdominal:     General: Bowel sounds are normal.     Palpations: Abdomen is soft.     Tenderness: There is no abdominal tenderness.  Musculoskeletal:        General: Normal range of motion.     Cervical back: Normal range of motion.     Right lower leg: No edema.     Left lower leg: No edema.  Skin:     General: Skin is warm and dry.  Neurological:     Mental Status: He is alert and oriented to person, place, and time. Mental status is at baseline.  Psychiatric:        Mood and Affect: Mood normal.      Assessment &  Plan:   See Encounters Tab for problem based charting.  Patient discussed with Dr. Lars Mage, D.O. [facility], PGY-2 Pager: [phone], Phone: [phone] Date 01/07/2020 Time 6:47 AM

## 2020-01-05 NOTE — Patient Instructions (Signed)
Thank you, Frank Morales for allowing [place] to provide your care today. Today we discussed Presurgical evaluation.    I have ordered the following labs for you:  Lab Orders  No laboratory test(s) ordered today     Tests ordered today:  none  Referrals ordered today:   Referral Orders  No referral(s) requested today     I have ordered the following medication/changed the following medications:   Stop the following medications: There are no discontinued medications.   Start the following medications: No orders of the defined types were placed in this encounter.    Follow up: 1 month    Remember: Please stop you xerelto 2 days prior to the day of surgery and restart 1 day after the day of surgery.  Should you have any questions or concerns please call the internal medicine clinic at [phone].     Marianna Payment, D.O. [place]

## 2020-01-07 ENCOUNTER — Encounter: Payer: Self-pay | Admitting: Internal Medicine

## 2020-01-07 DIAGNOSIS — I5032 Chronic diastolic (congestive) heart failure: Secondary | ICD-10-CM | POA: Diagnosis not present

## 2020-01-07 DIAGNOSIS — I509 Heart failure, unspecified: Secondary | ICD-10-CM | POA: Diagnosis not present

## 2020-01-07 NOTE — Assessment & Plan Note (Addendum)
She is presents today for preoperative risk evaluation for epidural injection of the cervical spine.  Patient does have a history of atrial fibrillation on anticoagulation which puts him at the highest risk for bleeding.  Otherwise he does not have a history of ischemic heart disease, CVA, insulin therapy or significant CKD.  His RCR index is 6.0% risk for death, MI, or cardiac arrest in 30 days.  I counseled the patient regarding these risks and stated that he will need to discontinue his anticoagulation 3 days prior to the procedure.  I gave him supplemental materials to remember his dosing of Xarelto before and after the procedure.  I also counseled him on speaking with the surgeon regarding his/her recommendations regarding anticoagulation.  Patient admits understanding.  Patient's procedure is scheduled for November 23 at 9 AM.

## 2020-01-10 NOTE — Progress Notes (Signed)
Internal Medicine Clinic Attending  Case discussed with Dr. Coe  At the time of the visit.  We reviewed the resident's history and exam and pertinent patient test results.  I agree with the assessment, diagnosis, and plan of care documented in the resident's note.  

## 2020-02-07 DIAGNOSIS — I5032 Chronic diastolic (congestive) heart failure: Secondary | ICD-10-CM | POA: Diagnosis not present

## 2020-02-07 DIAGNOSIS — I509 Heart failure, unspecified: Secondary | ICD-10-CM | POA: Diagnosis not present

## 2020-02-08 DIAGNOSIS — M5412 Radiculopathy, cervical region: Secondary | ICD-10-CM | POA: Diagnosis not present

## 2020-02-09 ENCOUNTER — Other Ambulatory Visit: Payer: Self-pay | Admitting: *Deleted

## 2020-02-09 MED ORDER — POTASSIUM CHLORIDE CRYS ER 20 MEQ PO TBCR
40.0000 meq | EXTENDED_RELEASE_TABLET | Freq: Two times a day (BID) | ORAL | 1 refills | Status: DC
Start: 2020-02-09 — End: 2020-04-19

## 2020-02-14 ENCOUNTER — Ambulatory Visit (INDEPENDENT_AMBULATORY_CARE_PROVIDER_SITE_OTHER): Payer: Medicaid Other | Admitting: Internal Medicine

## 2020-02-14 ENCOUNTER — Encounter: Payer: Self-pay | Admitting: Internal Medicine

## 2020-02-14 ENCOUNTER — Other Ambulatory Visit: Payer: Self-pay

## 2020-02-14 VITALS — BP 127/72 | HR 93 | Temp 97.9°F | Ht 71.0 in | Wt 319.8 lb

## 2020-02-14 DIAGNOSIS — Z0000 Encounter for general adult medical examination without abnormal findings: Secondary | ICD-10-CM

## 2020-02-14 DIAGNOSIS — Z23 Encounter for immunization: Secondary | ICD-10-CM | POA: Diagnosis not present

## 2020-02-14 DIAGNOSIS — I5032 Chronic diastolic (congestive) heart failure: Secondary | ICD-10-CM

## 2020-02-14 DIAGNOSIS — I4821 Permanent atrial fibrillation: Secondary | ICD-10-CM | POA: Diagnosis not present

## 2020-02-14 DIAGNOSIS — I10 Essential (primary) hypertension: Secondary | ICD-10-CM

## 2020-02-14 MED ORDER — METOLAZONE 2.5 MG PO TABS
2.5000 mg | ORAL_TABLET | ORAL | 1 refills | Status: DC
Start: 2020-02-14 — End: 2020-05-23

## 2020-02-14 NOTE — Assessment & Plan Note (Signed)
Patient continues to be on Xarelto and diltiazem 40 mg daily.  Denies any issues taking his medications.  No changes at this time.

## 2020-02-14 NOTE — Assessment & Plan Note (Signed)
Patient received a flu vaccine today

## 2020-02-14 NOTE — Assessment & Plan Note (Signed)
Patient is on Lasix 80 mg BID, metolazone 2.5 mg three times per week, and diltiazem 480 mg daily. BP well controlled at this time.  BP Readings from Last 3 Encounters:  02/14/20 127/72  01/05/20 105/72  12/22/19 106/61   -Continue current regimen -Check BMP today

## 2020-02-14 NOTE — Assessment & Plan Note (Signed)
Patient has a history of heart failure with preserved EF, most recent EF 55-60%.  He had been prescribed Dapagliflozin 10 mg daily however states that the pharmacy did not mention it and he is not able to pick it up yet.  He is currently on 3 L nasal cannula states that this is stable, sometimes will go down to 2 L at rest.  He denies any shortness of breath, chest pain, cough, palpitations, orthopnea, or worsening lower extremity swelling.  He always has some mild lower extremity swelling.  Checks his weight daily at home, weight has ranged from 326-330s.  He had a slight bump around Thanksgiving however today's weight is 326.  Weight on our scale today is 319. On exam, his vitals are stable.  No wheezing, rhonchi, or rails, he has 1+ pitting edema to his knees. [place] who stated that a PA needed to be done, they state that they have the PA and his medication should be ready by tomorrow.  -Continue Lasix 80 mg twice daily -Continue metolazone 2.5 mg 3 times a week -Continue potassium 40 mg twice daily -Advised patient to pick up Dapagliflozin 10 milligrams daily -Check BMP today -RTC in 3 months for repeat labs and assess tolerance of Dapagliflozin

## 2020-02-14 NOTE — Patient Instructions (Addendum)
[name],  It was a pleasure to see you today. Thank you for coming in.   Today we discussed your blood pressure and chronic medical conditions. In regards to this please continue your current medications. I am glad that you are doing well.    The pharmacy should have the new medication, Frank Morales, that you can pick up tomorrow.   I am checking some labs and will contact you if they are abnormal.   Please return to clinic in 3 months or sooner if needed.   Thank you again for coming in.   [name]

## 2020-02-14 NOTE — Progress Notes (Signed)
   CC: HTN, HFpEF  HPI:  [name] is a 59 y.o. with the history listed below presenting for follow up.      Past Medical History:  Diagnosis Date  . Acute exacerbation of CHF (congestive heart failure) ([place]) 12/15/2019  . Acute heart failure ([place]) 12/16/2019  . Atrial fibrillation and flutter ([place]) 08/17/2015   A. S/p failed DCCV // b. Severe BAE on echo >> rate control strategy (has seen AF clinic) // c. Xarelto for anticoag (CHADS2-VASc=2 / CHF, HTN)  . Chronic diastolic CHF (congestive heart failure) ([place]) 08/30/2015   A. Echo 6/17: Apical HK, moderate focal basal and mild concentric LVH, EF 50-55, diffuse HK, trivial MR, severe BAE, mild TR, PASP 37  . Dependence on continuous supplemental oxygen    3L  . Hepatitis C, chronic ([place]) 08/19/2015  . History of cardiac catheterization    a. LHC 6/17: LAD irregs, o/w no CAD  . Hypertension   . Hyposmia 12/22/2017  . OSA (obstructive sleep apnea) 11/21/2015   No Cpap  . Pleural effusion on right 03/14/2018  . Sleep apnea   . Tobacco abuse    Review of Systems:   Constitutional: Negative for chills and fever.  Respiratory: Negative for shortness of breath.   Cardiovascular: Negative for chest pain and leg swelling.  Gastrointestinal: Negative for abdominal pain, nausea and vomiting.  Neurological: Negative for dizziness and headaches.    Physical Exam:  Vitals:   02/14/20 1026  BP: 127/72  Pulse: 93  Temp: 97.9 F (36.6 C)  TempSrc: Oral  SpO2: 99%  Weight: (!) 319 lb 12.8 oz (145.1 kg)  Height: 5\' 11"  (1.803 m)   Physical Exam Constitutional:      Appearance: He is obese.     Comments: On 3L [place]  Cardiovascular:     Rate and Rhythm: Normal rate. Rhythm irregular.     Pulses: Normal pulses.     Heart sounds: Normal heart sounds.  Pulmonary:     Effort: Pulmonary effort is normal.     Breath sounds: Normal breath sounds.  Abdominal:     General: Abdomen is flat. Bowel sounds are normal.     Palpations:  Abdomen is soft.  Musculoskeletal:        General: Swelling (1+ BL pitting edema to knees) present.  Skin:    Capillary Refill: Capillary refill takes less than 2 seconds.  Neurological:     General: No focal deficit present.     Mental Status: He is alert and oriented to person, place, and time.  Psychiatric:        Mood and Affect: Mood normal.        Behavior: Behavior normal.      Assessment & Plan:   See Encounters Tab for problem based charting.  Patient discussed with Dr. Dareen Piano

## 2020-02-15 LAB — BMP8+ANION GAP
Anion Gap: 13 mmol/L (ref 10.0–18.0)
BUN/Creatinine Ratio: 26 — ABNORMAL HIGH (ref 9–20)
BUN: 18 mg/dL (ref 6–24)
CO2: 35 mmol/L — ABNORMAL HIGH (ref 20–29)
Calcium: 9.1 mg/dL (ref 8.7–10.2)
Chloride: 89 mmol/L — ABNORMAL LOW (ref 96–106)
Creatinine, Ser: 0.7 mg/dL — ABNORMAL LOW (ref 0.76–1.27)
GFR calc Af Amer: 120 mL/min/{1.73_m2} (ref >59)
GFR calc non Af Amer: 104 mL/min/{1.73_m2} (ref >59)
Glucose: 99 mg/dL (ref 65–99)
Potassium: 3.7 mmol/L (ref 3.5–5.2)
Sodium: 137 mmol/L (ref 134–144)

## 2020-02-16 NOTE — Progress Notes (Signed)
Internal Medicine Clinic Attending ° °Case discussed with Dr. Krienke  At the time of the visit.  We reviewed the resident’s history and exam and pertinent patient test results.  I agree with the assessment, diagnosis, and plan of care documented in the resident’s note.  °

## 2020-02-18 ENCOUNTER — Encounter: Payer: Self-pay | Admitting: Internal Medicine

## 2020-02-18 ENCOUNTER — Other Ambulatory Visit: Payer: Self-pay

## 2020-02-18 ENCOUNTER — Ambulatory Visit (INDEPENDENT_AMBULATORY_CARE_PROVIDER_SITE_OTHER): Payer: Medicaid Other | Admitting: Internal Medicine

## 2020-02-18 VITALS — BP 120/74 | HR 98 | Ht 71.0 in | Wt 315.4 lb

## 2020-02-18 DIAGNOSIS — I5043 Acute on chronic combined systolic (congestive) and diastolic (congestive) heart failure: Secondary | ICD-10-CM | POA: Diagnosis not present

## 2020-02-18 NOTE — Patient Instructions (Signed)
Medication Instructions:  No changes *If you need a refill on your cardiac medications before your next appointment, please call your pharmacy*   Lab Work: none If you have labs (blood work) drawn today and your tests are completely normal, you will receive your results only by: Marland Kitchen MyChart Message (if you have MyChart) OR . A paper copy in the mail If you have any lab test that is abnormal or we need to change your treatment, we will call you to review the results.   Testing/Procedures: none   Follow-Up: At [facility], you and your health needs are our priority.  As part of our continuing mission to provide you with exceptional heart care, we have created designated Provider Care Teams.  These Care Teams include your primary Cardiologist (physician) and Advanced Practice Providers (APPs -  Physician Assistants and Nurse Practitioners) who all work together to provide you with the care you need, when you need it.  We recommend signing up for the patient portal called "MyChart".  Sign up information is provided on this After Visit Summary.  MyChart is used to connect with patients for Virtual Visits (Telemedicine).  Patients are able to view lab/test results, encounter notes, upcoming appointments, etc.  Non-urgent messages can be sent to your provider as well.   To learn more about what you can do with MyChart, go to [email or web address].    Your next appointment:   End of Feb 2022  The format for your next appointment:   In Person  Provider:   You will see one of the following Advanced Practice Providers on your designated Care Team:    Richardson Dopp, PA-C  Robbie Lis, [place]   Other Instructions

## 2020-02-18 NOTE — Progress Notes (Signed)
Cardiology Office Note   Date:  02/18/2020   ID:  Frank Morales, DOB 02/27/1961, MRN [id number]  PCP:  [name], MD  Cardiologist:   Dorris Carnes, MD   Pt presents for f/u of diastolic CHF    History of Present Illness: Frank Morales is a 59 y.o. male with a history of diastolic CHF, OSA, persistant atrial fibrillation/flutter, COPD (on chronic O2) with continued tobacco abuse, HTN and substance abuse  In June 2017 he was admitted for CHF and  atrial flutter.   He underwent  Cardiac cath in June 2021 which showed no significant CAD   Echo showed LVEF was 50 to 55%     He was placed on Xarelto and underwent DCCV in July 2017  Echo in April 2021 LVEF 55 to 60%  He has had several admits for CHF, last in September 2021   I saw him in 2019   He was last seen in cardiology clinic on Dec 22 2019 by B Bhagat.  At that time volume status was not bad  Since seen he says his breathing hsa been stable   He denies CP  Ankles are relatively stable The pt says he is still smoking about 1 pack per week      Current Meds  Medication Sig  . albuterol (VENTOLIN HFA) 108 (90 Base) MCG/ACT inhaler Inhale 2 puffs into the lungs every 6 (six) hours as needed for wheezing or shortness of breath.  Marland Kitchen azelastine (ASTELIN) 0.1 % nasal spray INSTILL 2 SPRAYS INTO BOTH NOSTRILS TWICE DAILY (Patient taking differently: Place 2 sprays into both nostrils 2 (two) times daily. )  . dapagliflozin propanediol (FARXIGA) 10 MG TABS tablet Take 1 tablet (10 mg total) by mouth daily before breakfast.  . diltiazem (CARDIZEM CD) 240 MG 24 hr capsule Take 2 capsules (480 mg total) by mouth daily.  . fluticasone (FLONASE) 50 MCG/ACT nasal spray Place 1 spray into both nostrils daily.  . furosemide (LASIX) 80 MG tablet Take 1 tablet (80 mg total) by mouth 2 (two) times daily.  Marland Kitchen loratadine (CLARITIN) 10 MG tablet TAKE 1 TABLET(10 MG) BY MOUTH DAILY (Patient taking differently: Take 10 mg by mouth daily. )  .  metolazone (ZAROXOLYN) 2.5 MG tablet Take 1 tablet (2.5 mg total) by mouth 3 (three) times a week. Monday, Wednesday, Friday  . potassium chloride SA (KLOR-CON) 20 MEQ tablet Take 2 tablets (40 mEq total) by mouth 2 (two) times daily.  . rivaroxaban (XARELTO) 20 MG TABS tablet Take 1 tablet (20 mg total) by mouth daily with supper.     Allergies:   Lactose intolerance (gi)   Past Medical History:  Diagnosis Date  . Acute exacerbation of CHF (congestive heart failure) ([place]) 12/15/2019  . Acute heart failure ([place]) 12/16/2019  . Atrial fibrillation and flutter ([place]) 08/17/2015   A. S/p failed DCCV // b. Severe BAE on echo >> rate control strategy (has seen AF clinic) // c. Xarelto for anticoag (CHADS2-VASc=2 / CHF, HTN)  . Chronic diastolic CHF (congestive heart failure) ([place]) 08/30/2015   A. Echo 6/17: Apical HK, moderate focal basal and mild concentric LVH, EF 50-55, diffuse HK, trivial MR, severe BAE, mild TR, PASP 37  . Dependence on continuous supplemental oxygen    3L  . Hepatitis C, chronic ([place]) 08/19/2015  . History of cardiac catheterization    a. LHC 6/17: LAD irregs, o/w no CAD  . Hypertension   . Hyposmia 12/22/2017  .  OSA (obstructive sleep apnea) 11/21/2015   No Cpap  . Pleural effusion on right 03/14/2018  . Sleep apnea   . Tobacco abuse     Past Surgical History:  Procedure Laterality Date  . CARDIAC CATHETERIZATION N/A 08/21/2015   Procedure: Right/Left Heart Cath and Coronary Angiography;  Surgeon: Leonie Man, MD;  Location: [facility];  Service: Cardiovascular;  Laterality: N/A;  . CARDIOVERSION N/A 09/22/2015   Procedure: CARDIOVERSION;  Surgeon: Josue Hector, MD;  Location: [facility] ENDOSCOPY;  Service: Cardiovascular;  Laterality: N/A;  . COLONOSCOPY N/A 07/19/2016   Procedure: COLONOSCOPY;  Surgeon: Doran Stabler, MD;  Location: [facility] ENDOSCOPY;  Service: Gastroenterology;  Laterality: N/A;  . ESOPHAGOGASTRODUODENOSCOPY N/A 07/19/2016   Procedure:  ESOPHAGOGASTRODUODENOSCOPY (EGD);  Surgeon: Doran Stabler, MD;  Location: [facility];  Service: Gastroenterology;  Laterality: N/A;  . IR THORACENTESIS ASP PLEURAL SPACE W/IMG GUIDE  07/09/2019  . NO PAST SURGERIES    . SINUS ENDO WITH FUSION N/A 10/02/2018   Procedure: SINUS ENDO WITH FUSION;  Surgeon: Melissa Montane, MD;  Location: [facility];  Service: ENT;  Laterality: N/A;     Social History:  The patient  reports that he has been smoking cigarettes. He has been smoking about 0.20 packs per day. He has never used smokeless tobacco. He reports current alcohol use of about 5.0 standard drinks of alcohol per week. He reports that he does not use drugs.   Family History:  The patient's family history includes Diabetes in his mother; Hypertension in his maternal grandfather and mother; Pneumonia in his father.    ROS:  Please see the history of present illness. All other systems are reviewed and  Negative to the above problem except as noted.    PHYSICAL EXAM: VS:  BP 120/74   Pulse 98   Ht 5\' 11"  (1.803 m)   Wt (!) 315 lb 6.4 oz (143.1 kg)   SpO2 92%   BMI 43.99 kg/m   GEN:  Morbidly obese 59 yo  in no acute distress  Examined in chair  HEENT: normal  Neck: no obvious JVD Cardiac: RRR; no murmurs;  1+ LE  edema (soft) Respiratory:  Decreased airflow  Otherwise clear to auscultation bilaterally, normal work of breathing GI: soft, nontender  Obese MS: no deformity Moving all extremities   Skin: warm and dry, no rash  EKG:  EKG is not ordered today.   Lipid Panel    Component Value Date/Time   CHOL 151 12/16/2019 0807   TRIG 77 12/16/2019 0807   HDL 39 (L) 12/16/2019 0807   CHOLHDL 3.9 12/16/2019 0807   VLDL 15 12/16/2019 0807   LDLCALC 97 12/16/2019 0807      Wt Readings from Last 3 Encounters:  02/18/20 (!) 315 lb 6.4 oz (143.1 kg)  02/14/20 (!) 319 lb 12.8 oz (145.1 kg)  01/05/20 (!) 322 lb 9.6 oz (146.3 kg)      ASSESSMENT AND PLAN:  1  Chronic diastolic CHF    VOlume status is not bad.   I encouraged him to weigh daily.  Watch salt  2  COPD   Remains on chronic 3L O2  Continues to smoke some  Counselled on cessation  3  Atrial fibrillation/ flutter  Remains on anticoagulation  4  HTN  BP is OK  Continue to follow   Plan for f/u in Feb 2022   Current medicines are reviewed at length with the patient today.  The patient  does not have concerns regarding medicines.  Signed, Dorris Carnes, MD  02/18/2020 11:22 AM    [facility] [place], [place], [place]  [address] Phone: [phone]; Fax: [phone]

## 2020-03-08 DIAGNOSIS — I509 Heart failure, unspecified: Secondary | ICD-10-CM | POA: Diagnosis not present

## 2020-03-08 DIAGNOSIS — I5032 Chronic diastolic (congestive) heart failure: Secondary | ICD-10-CM | POA: Diagnosis not present

## 2020-04-08 DIAGNOSIS — I5032 Chronic diastolic (congestive) heart failure: Secondary | ICD-10-CM | POA: Diagnosis not present

## 2020-04-08 DIAGNOSIS — I509 Heart failure, unspecified: Secondary | ICD-10-CM | POA: Diagnosis not present

## 2020-04-09 ENCOUNTER — Other Ambulatory Visit: Payer: Self-pay | Admitting: Internal Medicine

## 2020-04-09 DIAGNOSIS — I5033 Acute on chronic diastolic (congestive) heart failure: Secondary | ICD-10-CM

## 2020-04-09 DIAGNOSIS — I4892 Unspecified atrial flutter: Secondary | ICD-10-CM

## 2020-04-09 DIAGNOSIS — I4891 Unspecified atrial fibrillation: Secondary | ICD-10-CM

## 2020-04-19 ENCOUNTER — Other Ambulatory Visit: Payer: Self-pay | Admitting: *Deleted

## 2020-04-20 ENCOUNTER — Other Ambulatory Visit: Payer: Self-pay | Admitting: *Deleted

## 2020-04-20 ENCOUNTER — Other Ambulatory Visit: Payer: Self-pay

## 2020-04-20 ENCOUNTER — Telehealth: Payer: Self-pay | Admitting: Internal Medicine

## 2020-04-20 DIAGNOSIS — Z599 Problem related to housing and economic circumstances, unspecified: Secondary | ICD-10-CM

## 2020-04-20 MED ORDER — POTASSIUM CHLORIDE CRYS ER 20 MEQ PO TBCR
40.0000 meq | EXTENDED_RELEASE_TABLET | Freq: Two times a day (BID) | ORAL | 1 refills | Status: DC
Start: 2020-04-20 — End: 2020-05-23

## 2020-04-20 NOTE — Telephone Encounter (Signed)
I reached out to Frank Morales to get him scheduled for a phone visit with the Managed Medicaid Team Pharmacist. I left my name and number for him to call me back. I will reach out again in the next 7-14 days if I have not heard back from him.

## 2020-04-20 NOTE — Patient Outreach (Cosign Needed)
Medicaid Managed Care   Nurse Care Manager Note  04/20/2020 Name:  DAKSH STOCKLEY MRN:  [id number] DOB:  28-Jan-1961  GINO SANTORA is an 60 y.o. year old male who is a primary patient of Sherry Ruffing, Adela Lank, MD.  The [facility] Managed Care Coordination team was consulted for assistance with:    CHF HTN  Mr. Kammer was given information about Medicaid Managed Care Coordination team services today. Carver Fila Si agreed to services and verbal consent obtained.  Engaged with patient by telephone for initial visit in response to provider referral for case management and/or care coordination services.   Assessments/Interventions:  Review of past medical history, allergies, medications, health status, including review of consultants reports, laboratory and other test data, was performed as part of comprehensive evaluation and provision of chronic care management services.  SDOH (Social Determinants of Health) assessments and interventions performed:   Care Plan  Allergies  Allergen Reactions  . Lactose Intolerance (Gi) Nausea And Vomiting    Medications Reviewed Today    Reviewed by Melissa Montane, RN (Registered Nurse) on 04/20/20 at [phone]  Med List Status: <None>  Medication Order Taking? Sig Documenting Provider Last Dose Status Informant  albuterol (VENTOLIN HFA) 108 (90 Base) MCG/ACT inhaler [id number] Yes Inhale 2 puffs into the lungs every 6 (six) hours as needed for wheezing or shortness of breath. Marianna Payment, MD Taking Active   azelastine (ASTELIN) 0.1 % nasal spray [id number] Yes INSTILL 2 SPRAYS INTO BOTH NOSTRILS TWICE DAILY  Patient taking differently: Place 2 sprays into both nostrils 2 (two) times daily.   Sid Falcon, MD Taking Active Self           Med Note Tamala Julian, JEFFREY W   Thu Oct 21, 2019  5:37 AM)    dapagliflozin propanediol (FARXIGA) 10 MG TABS tablet [id number] Yes Take 1 tablet (10 mg total) by mouth daily before breakfast. Marianna Payment, MD Taking  Active   diltiazem (CARDIZEM CD) 240 MG 24 hr capsule [id number] Yes Take 2 capsules (480 mg total) by mouth daily. [name], MD Taking Active Self  fluticasone [facility]) 50 MCG/ACT nasal spray [id number] Yes Place 1 spray into both nostrils daily. Sid Falcon, MD Taking Active Self           Med Note Tamala Julian, JEFFREY W   Thu Oct 21, 2019  5:37 AM)    furosemide (LASIX) 80 MG tablet [id number] Yes Take 1 tablet (80 mg total) by mouth 2 (two) times daily. [name], MD Taking Active Self  loratadine (CLARITIN) 10 MG tablet [id number] No TAKE 1 TABLET(10 MG) BY MOUTH DAILY  Patient not taking: Reported on 04/20/2020   [name], MD Not Taking Active Self  metolazone (ZAROXOLYN) 2.5 MG tablet [id number] Yes Take 1 tablet (2.5 mg total) by mouth 3 (three) times a week. Monday, Wednesday, Friday [name], MD Taking Active   potassium chloride SA (KLOR-CON) 20 MEQ tablet [id number] No Take 2 tablets (40 mEq total) by mouth 2 (two) times daily.  Patient not taking: Reported on 04/20/2020   Lucious Groves, DO Not Taking Active            Med Note (Briarrose Shor A   Thu Apr 20, 2020  9:20 AM) Ran out, refill requested  XARELTO 20 MG TABS tablet [id number] Yes TAKE 1 TABLET(20 MG) BY MOUTH DAILY WITH SUPPER Marianna Payment, MD Taking Active  Patient Active Problem List   Diagnosis Date Noted  . Left cervical radiculopathy 10/31/2019  . Healthcare maintenance 07/20/2019  . Permanent atrial fibrillation   . Chronic dental pain 08/19/2016  . Grade I internal hemorrhoids   . 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 OSA (obstructive sleep apnea) 11/21/2015  . Chronic diastolic CHF (congestive heart failure) ([place]) 08/30/2015  . Hepatitis C, chronic ([place]) 08/19/2015  . Morbid obesity ([place]) 08/17/2015  . Hypertension 08/17/2015  . Tobacco use disorder 08/17/2015    Conditions to be addressed/monitored per PCP order:  CHF and HTN  Care Plan : Heart Failure (Adult)   Updates made by Melissa Montane, RN since 04/20/2020 12:00 AM    Problem: Symptom Exacerbation (Heart Failure)     Long-Range Goal: Symptom Exacerbation Prevented or Minimized   Start Date: 04/20/2020  Expected End Date: 08/18/2020  This Visit's Progress: On track  Priority: High  Note:   Current Barriers:  Marland Kitchen Knowledge deficit related to basic heart failure pathophysiology and self care management-Mr Bartmess is managing his CHF and HTN by taking prescribed medications, cutting out salt, eating more fruits and vegetables and working on smoking cessation. He is down to 1/2 pack per day from 1 pack. His weight has improved from 375 to 310 pounds. He is on 3L of oxygen, which he wears most of the time. He weighs and checks his BP daily. BP today was 123/84. He is not sleeping well at night and has to sit in a recliner to sleep. He has had a sleep study in the past, CPAP was recommended. Mr Idrovo declines to use CPAP.  Marland Kitchen Financial strain . Housing barriers-Currently lives with his uncle. Patient would like a place of his own Case Manager Clinical Goal(s):  Marland Kitchen Over the next 60 days, patient will work with [facility] addressing any barriers to managing his health . Over the next 60 days, patient will take all Heart Failure mediations as prescribed . Over the next 60 days, patient will weigh daily and record (notifying MD of 3 lb weight gain over night or 5 lb in a week) . Over the next 30 days, patient will work with [facility] Pharmacist for medication management . Over the next 30 days, patient will work with [place] to address housing barriers Interventions:  . Basic overview and discussion of pathophysiology of Heart Failure reviewed  . Provided verbal education on low sodium diet . Provided written education on low sodium diet . Discussed importance of daily weight and advised patient to weigh and record daily . Reviewed role of diuretics in prevention of fluid overload and management of heart  failure . Referral to MM Pharmacist . Referral to MM Care Guide Patient Goals/Self-Care Activities . Over the next 60 days, patient will:   . - call office if I gain more than 2 pounds in one day or 5 pounds in one week . - keep legs up while sitting . - track weight in diary . - use salt in moderation . - watch for swelling in feet, ankles and legs every day . - weigh myself daily  . - begin a heart failure diary . - bring diary to all appointments . - eat more whole grains, fruits and vegetables, lean meats and healthy fats . - know when to call the doctor . - track symptoms and what helps feel better or worse . - dress right for the weather, hot or cold  . - continue to work on smoking cessation,  call 1-800-QUIT-NOW for assistance . - call to schedule follow up appointment with your PCP  Follow Up Plan: Telephone follow up appointment with care management team member scheduled for:06/19/20 @ 9am      Follow Up:  Patient agrees to Care Plan and Follow-up.  Plan: The Managed Medicaid care management team will reach out to the patient again over the next 60 days.  Date/time of next scheduled RN care management/care coordination outreach: 06/19/20 @ [place] RN, [place] RN Care Coordinator

## 2020-04-20 NOTE — Patient Instructions (Signed)
Visit Information  Mr. Birden was given information about Medicaid Managed Care team care coordination services as a part of their [place] Medicaid benefit. Brailen Braxton Guard verbally consented to engagement with the [facility] Managed Care team.   For questions related to your [facility], please call: [phone] or visit the homepage here: [email or web address]  If you would like to schedule transportation through your [facility], please call the following number at least 2 days in advance of your appointment: [phone]  Mr. Sassin - following are the goals we discussed in your visit today:  Goals Addressed            This Visit's Progress   . Track and Manage Fluids and Swelling-Heart Failure       Timeframe:  Long-Range Goal Priority:  High Start Date:  04/20/20                           Expected End Date:  08/18/20                   Follow Up Date 06/19/20   - call office if I gain more than 2 pounds in one day or 5 pounds in one week - keep legs up while sitting - track weight in diary - use salt in moderation - watch for swelling in feet, ankles and legs every day - weigh myself daily    Why is this important?    It is important to check your weight daily and watch how much salt and liquids you have.   It will help you to manage your heart failure.      . Track and Manage Symptoms-Heart Failure       Timeframe:  Long-Range Goal Priority:  High Start Date:  04/20/20                           Expected End Date: 6/322                      Follow Up Date 06/19/20    - begin a heart failure diary - bring diary to all appointments - eat more whole grains, fruits and vegetables, lean meats and healthy fats - know when to call the doctor - track symptoms and what helps feel better or worse - dress right for the weather, hot or cold  - continue to  work on smoking cessation, call 1-800-QUIT-NOW for assistance - call to schedule follow up appointment with your PCP   Why is this important?    You will be able to handle your symptoms better if you keep track of them.   Making some simple changes to your lifestyle will help.   Eating healthy is one thing you can do to take good care of yourself.          Please see education materials related to heart healthy diet provided as print materials.     Heart-Healthy Eating Plan Heart-healthy meal planning includes:  Eating less unhealthy fats.  Eating more healthy fats.  Making other changes in your diet. Talk with your doctor or a diet specialist (dietitian) to create an eating plan that is right for you.  What are tips for following this plan? Cooking  Avoid frying your food. Try to bake, boil, grill, or broil it instead. You can also reduce  fat by:  Removing the skin from poultry.  Removing all visible fats from meats.  Steaming vegetables in water or broth.  Meal planning   At meals, divide your plate into four equal parts: ? Fill one-half of your plate with vegetables and green salads. ? Fill one-fourth of your plate with whole grains. ? Fill one-fourth of your plate with lean protein foods.  Eat 4-5 servings of vegetables per day. A serving of vegetables is: ? 1 cup of raw or cooked vegetables. ? 2 cups of raw leafy greens.  Eat 4-5 servings of fruit per day. A serving of fruit is: ? 1 medium whole fruit. ?  cup of dried fruit. ?  cup of fresh, frozen, or canned fruit. ?  cup of 100% fruit juice.  Eat more foods that have soluble fiber. These are apples, broccoli, carrots, beans, peas, and barley. Try to get 20-30 g of fiber per day.  Eat 4-5 servings of nuts, legumes, and seeds per week: ? 1 serving of dried beans or legumes equals  cup after being cooked. ? 1 serving of nuts is  cup. ? 1 serving of seeds equals 1 tablespoon.    General  information  Eat more home-cooked food. Eat less restaurant, buffet, and fast food.  Limit or avoid alcohol.  Limit foods that are high in starch and sugar.  Avoid fried foods.  Lose weight if you are overweight.  Keep track of how much salt (sodium) you eat. This is important if you have high blood pressure. Ask your doctor to tell you more about this.  Try to add vegetarian meals each week.  Fats  Choose healthy fats. These include olive oil and canola oil, flaxseeds, walnuts, almonds, and seeds.  Eat more omega-3 fats. These include salmon, mackerel, sardines, tuna, flaxseed oil, and ground flaxseeds. Try to eat fish at least 2 times each week.  Check food labels. Avoid foods with trans fats or high amounts of saturated fat.  Limit saturated fats. ? These are often found in animal products, such as meats, butter, and cream. ? These are also found in plant foods, such as palm oil, palm kernel oil, and coconut oil.  Avoid foods with partially hydrogenated oils in them. These have trans fats. Examples are stick margarine, some tub margarines, cookies, crackers, and other baked goods.  What foods can I eat? Fruits All fresh, canned (in natural juice), or frozen fruits.  Vegetables Fresh or frozen vegetables (raw, steamed, roasted, or grilled). Green salads.  Grains Most grains. Choose whole wheat and whole grains most of the time. Rice and pasta, including brown rice and pastas made with whole wheat.  Meats and other proteins Lean, well-trimmed beef, veal, pork, and lamb. Chicken and [place] without skin. All fish and shellfish. Wild duck, rabbit, pheasant, and venison. Egg whites or low-cholesterol egg substitutes. Dried beans, peas, lentils, and tofu. Seeds and most nuts.  Dairy Low-fat or nonfat cheeses, including ricotta and mozzarella. Skim or 1% milk that is liquid, powdered, or evaporated. Buttermilk that is made with low-fat milk. Nonfat or low-fat yogurt.  Fats  and oils Non-hydrogenated (trans-free) margarines. Vegetable oils, including soybean, sesame, sunflower, olive, peanut, safflower, corn, canola, and cottonseed. Salad dressings or mayonnaise made with a vegetable oil.  Beverages Mineral water. Coffee and tea. Diet carbonated beverages.  Sweets and desserts Sherbet, gelatin, and fruit ice. Small amounts of dark chocolate. Limit all sweets and desserts.  Seasonings and condiments All seasonings and condiments. The items  listed above may not be a complete list of foods and drinks you can eat. Contact a dietitian for more options.  What foods should I avoid? Fruits Canned fruit in heavy syrup. Fruit in cream or butter sauce. Fried fruit. Limit coconut.  Vegetables Vegetables cooked in cheese, cream, or butter sauce. Fried vegetables.  Grains Breads that are made with saturated or trans fats, oils, or whole milk. Croissants. Sweet rolls. Donuts. High-fat crackers, such as cheese crackers.  Meats and other proteins Fatty meats, such as hot dogs, ribs, sausage, bacon, rib-eye roast or steak. High-fat deli meats, such as salami and bologna. Caviar. Domestic duck and goose. Organ meats, such as liver.  Dairy Cream, sour cream, cream cheese, and creamed cottage cheese. Whole-milk cheeses. Whole or 2% milk that is liquid, evaporated, or condensed. Whole buttermilk. Cream sauce or high-fat cheese sauce. Yogurt that is made from whole milk. Fats and oils Meat fat, or shortening. Cocoa butter, hydrogenated oils, palm oil, coconut oil, palm kernel oil. Solid fats and shortenings, including bacon fat, salt pork, lard, and butter. Nondairy cream substitutes. Salad dressings with cheese or sour cream.  Beverages Regular sodas and juice drinks with added sugar.  Sweets and desserts Frosting. Pudding. Cookies. Cakes. Pies. Milk chocolate or white chocolate. Buttered syrups. Full-fat ice cream or ice cream drinks. The items listed above may not be a  complete list of foods and drinks to avoid. Contact a dietitian for more information.  Summary  Heart-healthy meal planning includes eating less unhealthy fats, eating more healthy fats, and making other changes in your diet.  Eat a balanced diet. This includes fruits and vegetables, low-fat or nonfat dairy, lean protein, nuts and legumes, whole grains, and heart-healthy oils and fats. This information is not intended to replace advice given to you by your health care provider. Make sure you discuss any questions you have with your health care provider. Document Revised: 05/08/2017 Document Reviewed: 04/11/2017 Elsevier Patient Education  2021 [employer].   The patient verbalized understanding of instructions provided today and agreed to receive a mailed copy of patient instruction and/or educational materials.  Telephone follow up appointment with Managed Medicaid care management team member scheduled for:06/19/20 @ 9am  Melissa Montane, RN  Following is a copy of your plan of care:  Patient Care Plan: Heart Failure (Adult)    Problem Identified: Symptom Exacerbation (Heart Failure)     Long-Range Goal: Symptom Exacerbation Prevented or Minimized   Start Date: 04/20/2020  Expected End Date: 08/18/2020  This Visit's Progress: On track  Priority: High  Note:   Current Barriers:  Marland Kitchen Knowledge deficit related to basic heart failure pathophysiology and self care management-Mr Thul is managing his CHF and HTN by taking prescribed medications, cutting out salt, eating more fruits and vegetables and working on smoking cessation. He is down to 1/2 pack per day from 1 pack. His weight has improved from 375 to 310 pounds. He is on 3L of oxygen, which he wears most of the time. He weighs and checks his BP daily. BP today was 123/84. He is not sleeping well at night and has to sit in a recliner to sleep. He has had a sleep study in the past, CPAP was recommended. Mr Gotz declines to use CPAP.   Marland Kitchen Financial strain . Housing barriers-Currently lives with his uncle. Patient would like a place of his own Case Manager Clinical Goal(s):  Marland Kitchen Over the next 60 days, patient will work with [facility] addressing any  barriers to managing his health . Over the next 60 days, patient will take all Heart Failure mediations as prescribed . Over the next 60 days, patient will weigh daily and record (notifying MD of 3 lb weight gain over night or 5 lb in a week) . Over the next 30 days, patient will work with [facility] Pharmacist for medication management . Over the next 30 days, patient will work with [place] to address housing barriers Interventions:  . Basic overview and discussion of pathophysiology of Heart Failure reviewed  . Provided verbal education on low sodium diet . Provided written education on low sodium diet . Discussed importance of daily weight and advised patient to weigh and record daily . Reviewed role of diuretics in prevention of fluid overload and management of heart failure . Referral to MM Pharmacist . Referral to MM Care Guide Patient Goals/Self-Care Activities . Over the next 60 days, patient will:   . - call office if I gain more than 2 pounds in one day or 5 pounds in one week . - keep legs up while sitting . - track weight in diary . - use salt in moderation . - watch for swelling in feet, ankles and legs every day . - weigh myself daily  . - begin a heart failure diary . - bring diary to all appointments . - eat more whole grains, fruits and vegetables, lean meats and healthy fats . - know when to call the doctor . - track symptoms and what helps feel better or worse . - dress right for the weather, hot or cold  . - continue to work on smoking cessation, call 1-800-QUIT-NOW for assistance . - call to schedule follow up appointment with your PCP  Follow Up Plan: Telephone follow up appointment with care management team member scheduled for:06/19/20 @ 9am

## 2020-04-25 ENCOUNTER — Telehealth: Payer: Self-pay

## 2020-04-25 NOTE — Telephone Encounter (Signed)
   Telephone encounter was:  Successful.  04/25/2020 Name: Frank Morales MRN: [id number] DOB: 18-Sep-1960  Frank Morales is a 60 y.o. year old male who is a primary care patient of Sherry Ruffing, Adela Lank, MD . The community resource team was consulted for assistance with housing resources  Care guide performed the following interventions: Patient provided with information about care guide support team and interviewed to confirm resource needs Investigation of community resources performed Emailed letter with housing resources to [employer] to mail to patient. He does not have email or acces to the internet..  Follow Up Plan:  Care guide will follow up with patient by phone over the next 7 days  Frank Morales, AAS Paralegal, [place] . Embedded Care Coordination [facility]  Care Management  [address], [place] [address] ??[email or web address]  ?? [phone]   [email or web address]

## 2020-05-01 ENCOUNTER — Other Ambulatory Visit: Payer: Self-pay

## 2020-05-01 NOTE — Patient Instructions (Signed)
Visit Information  Mr. Lindaman was given information about Medicaid Managed Care team care coordination services as a part of their [place] Medicaid benefit. Emonte Dieujuste Henle verbally consented to engagement with the [facility] Managed Care team.   For questions related to your [facility], please call: [phone] or visit the homepage here: [email or web address]  If you would like to schedule transportation through your [facility], please call the following number at least 2 days in advance of your appointment: [phone]  Mr. Mahaffy - following are the goals we discussed in your visit today:  Goals Addressed            This Visit's Progress   . Manage My Medicine       Timeframe:  Short-Term Goal Priority:  High Start Date:                Immediately             Expected End Date:       Never                Follow Up Date 05/29/20    - call for medicine refill 2 or 3 days before it runs out    Why is this important?   . These steps will help you keep on track with your medicines.   Notes: Patient states has trouble getting refills       Please see education materials related to HTN provided as print materials.   Patient verbalizes understanding of instructions provided today.   The Managed Medicaid care management team will reach out to the patient again over the next 30 days.   Lane Hacker, [facility]  Following is a copy of your plan of care:  Patient Care Plan: Heart Failure (Adult)    Problem Identified: Symptom Exacerbation (Heart Failure)     Long-Range Goal: Symptom Exacerbation Prevented or Minimized   Start Date: 04/20/2020  Expected End Date: 08/18/2020  This Visit's Progress: On track  Priority: High  Note:   Current Barriers:  Marland Kitchen Knowledge deficit related to basic heart failure pathophysiology and self care management-Mr Knauff is  managing his CHF and HTN by taking prescribed medications, cutting out salt, eating more fruits and vegetables and working on smoking cessation. He is down to 1/2 pack per day from 1 pack. His weight has improved from 375 to 310 pounds. He is on 3L of oxygen, which he wears most of the time. He weighs and checks his BP daily. BP today was 123/84. He is not sleeping well at night and has to sit in a recliner to sleep. He has had a sleep study in the past, CPAP was recommended. Mr Vetrano declines to use CPAP.  Marland Kitchen Financial strain . Housing barriers-Currently lives with his uncle. Patient would like a place of his own Case Manager Clinical Goal(s):  Marland Kitchen Over the next 60 days, patient will work with [facility] addressing any barriers to managing his health . Over the next 60 days, patient will take all Heart Failure mediations as prescribed . Over the next 60 days, patient will weigh daily and record (notifying MD of 3 lb weight gain over night or 5 lb in a week) . Over the next 30 days, patient will work with MM Pharmacist for medication management . Over the next 30 days, patient will work with [place] to address housing barriers Interventions:  . Basic overview and discussion  of pathophysiology of Heart Failure reviewed  . Provided verbal education on low sodium diet . Provided written education on low sodium diet . Discussed importance of daily weight and advised patient to weigh and record daily . Reviewed role of diuretics in prevention of fluid overload and management of heart failure . Referral to MM Pharmacist . Referral to MM Care Guide Patient Goals/Self-Care Activities . Over the next 60 days, patient will:   . - call office if I gain more than 2 pounds in one day or 5 pounds in one week . - keep legs up while sitting . - track weight in diary . - use salt in moderation . - watch for swelling in feet, ankles and legs every day . - weigh myself daily  . - begin a heart failure diary . -  bring diary to all appointments . - eat more whole grains, fruits and vegetables, lean meats and healthy fats . - know when to call the doctor . - track symptoms and what helps feel better or worse . - dress right for the weather, hot or cold  . - continue to work on smoking cessation, call 1-800-QUIT-NOW for assistance . - call to schedule follow up appointment with your PCP  Follow Up Plan: Telephone follow up appointment with care management team member scheduled for:06/19/20 @ 9am    Patient Care Plan: Medication Management    Problem Identified: Health Promotion or Disease Self-Management (General Plan of Care)     Goal: Medication Management   Note:   Current Barriers:  . Unable to independently afford treatment regimen . Does not maintain contact with provider office . Does not contact provider office for questions/concerns .   Pharmacist Clinical Goal(s):  Marland Kitchen Over the next 30 days, patient will achieve adherence to monitoring guidelines and medication adherence to achieve therapeutic efficacy . contact provider office for questions/concerns as evidenced notation of same in electronic health record through collaboration with PharmD and provider.  .   Interventions: . Inter-disciplinary care team collaboration (see longitudinal plan of care) . Comprehensive medication review performed; medication list updated in electronic medical record  @RXCPDIABETES @ @RXCPHYPERTENSION @  Patient Goals/Self-Care Activities . Over the next 30 days, patient will:  - take medications as prescribed collaborate with provider on medication access solutions  Follow Up Plan: The care management team will reach out to the patient again over the next 30 days.     Task: Mutually Develop and [name] Achievement of Patient Goals   Note:   Care Management Activities:    - verbalization of feelings encouraged    Notes:

## 2020-05-01 NOTE — Patient Outreach (Signed)
Medicaid Managed Care    Pharmacy Note  05/01/2020 Name: Frank Morales MRN: [id number] DOB: November 26, 1960  FED CECI is a 60 y.o. year old male who is a primary care patient of Sherry Ruffing, Adela Lank, MD. The [facility] Managed Care Coordination team was consulted for assistance with disease management and care coordination needs.    Engaged with patient Engaged with patient by telephone for initial visit in response to referral for case management and/or care coordination services.  Frank Morales was given information about Managed Medicaid Care Coordination team services today. Frank Morales agreed to services and verbal consent obtained.   Objective:  Lab Results  Component Value Date   CREATININE 0.70 (L) 02/14/2020   CREATININE 0.62 (L) 12/22/2019   CREATININE 0.66 12/17/2019    Lab Results  Component Value Date   HGBA1C 6.0 (H) 12/15/2019       Component Value Date/Time   CHOL 151 12/16/2019 0807   TRIG 77 12/16/2019 0807   HDL 39 (L) 12/16/2019 0807   CHOLHDL 3.9 12/16/2019 0807   VLDL 15 12/16/2019 0807   LDLCALC 97 12/16/2019 0807    Other: (TSH, CBC, Vit D, etc.)  Clinical ASCVD: Yes  The 10-year ASCVD risk score [name], et al., 2013) is: 18.2%   Values used to calculate the score:     Age: 89 years     Sex: Male     Is Non-Hispanic African American: Yes     Diabetic: No     Tobacco smoker: Yes     Systolic Blood Pressure: 149 mmHg     Is BP treated: Yes     HDL Cholesterol: 39 mg/dL     Total Cholesterol: 151 mg/dL    Other: (CHADS2VASc if Afib, PHQ9 if depression, MMRC or CAT for COPD, ACT, DEXA)  BP Readings from Last 3 Encounters:  02/18/20 120/74  02/14/20 127/72  01/05/20 105/72    Assessment/Interventions: Review of patient past medical history, allergies, medications, health status, including review of consultants reports, laboratory and other test data, was performed as part of comprehensive evaluation and provision of chronic  care management services.   Smoking  PPD  Cardio Home Readings: 126/85, 123/84, 131/89, 122/87 Weigt: 326/322/327/330/336/327/327/320 Diltiazem 240mg  (Needs refills) Furosemide 80mg  BID Metolazone KCl  Xarelto Plan: At goal,  patient stable/ symptoms controlled    Pre-DM Wilder Glade Plan: At goal,  patient stable/ symptoms controlled   Meds: Patient states he wishes his medications would be 90 days on the 2nd of the month and delivered Plan: Mentioned [facility] but patient would like to think about it. [facility] does not provide these services and he states he has trouble getting refills from them (Always runs out and they have to fax the provider) but not interested in [facility] at this time  SDOH (Social Determinants of Health) assessments and interventions performed:    Care Plan  Allergies  Allergen Reactions  . Lactose Intolerance (Gi) Nausea And Vomiting    Medications Reviewed Today    Reviewed by Melissa Montane, RN (Registered Nurse) on 04/20/20 at [phone]  Med List Status: <None>  Medication Order Taking? Sig Documenting Provider Last Dose Status Informant  albuterol (VENTOLIN HFA) 108 (90 Base) MCG/ACT inhaler [id number] Yes Inhale 2 puffs into the lungs every 6 (six) hours as needed for wheezing or shortness of breath. Marianna Payment, MD Taking Active   azelastine (ASTELIN) 0.1 % nasal spray [id number] Yes INSTILL 2 SPRAYS INTO BOTH NOSTRILS TWICE DAILY  Patient taking differently: Place 2 sprays into both nostrils 2 (two) times daily.   Sid Falcon, MD Taking Active Self           Med Note Tamala Julian, JEFFREY W   Thu Oct 21, 2019  5:37 AM)    dapagliflozin propanediol (FARXIGA) 10 MG TABS tablet [id number] Yes Take 1 tablet (10 mg total) by mouth daily before breakfast. Marianna Payment, MD Taking Active   diltiazem (CARDIZEM CD) 240 MG 24 hr capsule [id number] Yes Take 2 capsules (480 mg total) by mouth daily. [name], MD Taking Active Self  fluticasone [facility])  50 MCG/ACT nasal spray [id number] Yes Place 1 spray into both nostrils daily. Sid Falcon, MD Taking Active Self           Med Note Tamala Julian, JEFFREY W   Thu Oct 21, 2019  5:37 AM)    furosemide (LASIX) 80 MG tablet [id number] Yes Take 1 tablet (80 mg total) by mouth 2 (two) times daily. [name], MD Taking Active Self  loratadine (CLARITIN) 10 MG tablet [id number] No TAKE 1 TABLET(10 MG) BY MOUTH DAILY  Patient not taking: Reported on 04/20/2020   [name], MD Not Taking Active Self  metolazone (ZAROXOLYN) 2.5 MG tablet [id number] Yes Take 1 tablet (2.5 mg total) by mouth 3 (three) times a week. Monday, Wednesday, Friday [name], MD Taking Active   potassium chloride SA (KLOR-CON) 20 MEQ tablet [id number] No Take 2 tablets (40 mEq total) by mouth 2 (two) times daily.  Patient not taking: Reported on 04/20/2020   Lucious Groves, DO Not Taking Active            Med Note (ROBB, MELANIE A   Thu Apr 20, 2020  9:20 AM) Ran out, refill requested  XARELTO 20 MG TABS tablet [id number] Yes TAKE 1 TABLET(20 MG) BY MOUTH DAILY WITH SUPPER Marianna Payment, MD Taking Active           Patient Active Problem List   Diagnosis Date Noted  . Left cervical radiculopathy 10/31/2019  . Healthcare maintenance 07/20/2019  . Permanent atrial fibrillation   . Chronic dental pain 08/19/2016  . Grade I internal hemorrhoids   . 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 OSA (obstructive sleep apnea) 11/21/2015  . Chronic diastolic CHF (congestive heart failure) ([place]) 08/30/2015  . Hepatitis C, chronic ([place]) 08/19/2015  . Morbid obesity ([place]) 08/17/2015  . Hypertension 08/17/2015  . Tobacco use disorder 08/17/2015    Conditions to be addressed/monitored: HTN and DM  Care Plan : Medication Management  Updates made by Lane Hacker, [place] since 05/01/2020 12:00 AM    Problem: Health Promotion or Disease Self-Management (General Plan of Care)     Goal: Medication Management   Note:   Current  Barriers:  . Unable to independently afford treatment regimen . Does not maintain contact with provider office . Does not contact provider office for questions/concerns .   Pharmacist Clinical Goal(s):  Marland Kitchen Over the next 30 days, patient will achieve adherence to monitoring guidelines and medication adherence to achieve therapeutic efficacy . contact provider office for questions/concerns as evidenced notation of same in electronic health record through collaboration with PharmD and provider.  .   Interventions: . Inter-disciplinary care team collaboration (see longitudinal plan of care) . Comprehensive medication review performed; medication list updated in electronic medical record  @RXCPDIABETES @ @RXCPHYPERTENSION @  Patient Goals/Self-Care Activities . Over the next 30 days, patient will:  - take medications as  prescribed collaborate with provider on medication access solutions  Follow Up Plan: The care management team will reach out to the patient again over the next 30 days.     Task: Mutually Develop and [name] Achievement of Patient Goals   Note:   Care Management Activities:    - verbalization of feelings encouraged    Notes:       Medication Assistance: None required. Patient affirms current coverage meets needs.   Follow up: Agree   Plan: The care management team will reach out to the patient again over the next 30 days.   [place] Constable, Pharm.D., Managed Medicaid Pharmacist - [phone]

## 2020-05-02 ENCOUNTER — Telehealth: Payer: Self-pay

## 2020-05-02 NOTE — Telephone Encounter (Signed)
   Telephone encounter was:  Successful.  05/02/2020 Name: Frank Morales MRN: [id number] DOB: 1960-12-08  SAJAD GLANDER is a 60 y.o. year old male who is a primary care patient of Sherry Ruffing, Adela Lank, MD . The community resource team was consulted for assistance with housing  Care guide performed the following interventions: Follow up call placed to the patient to discuss status of referral Patient received mailed housing information and has no other needs at this time..  Follow Up Plan:  No further follow up planned at this time. The patient has been provided with needed resources.  Tosha Belgarde, AAS Paralegal, [place] . Embedded Care Coordination [facility]  Care Management  [address], [place] [address] ??[email or web address]  ?? [phone]   [email or web address]

## 2020-05-09 ENCOUNTER — Other Ambulatory Visit: Payer: Self-pay | Admitting: Internal Medicine

## 2020-05-09 DIAGNOSIS — I509 Heart failure, unspecified: Secondary | ICD-10-CM | POA: Diagnosis not present

## 2020-05-09 DIAGNOSIS — I5032 Chronic diastolic (congestive) heart failure: Secondary | ICD-10-CM

## 2020-05-12 ENCOUNTER — Ambulatory Visit: Payer: Medicaid Other | Admitting: Physician Assistant

## 2020-05-16 ENCOUNTER — Telehealth: Payer: Self-pay

## 2020-05-16 DIAGNOSIS — I5032 Chronic diastolic (congestive) heart failure: Secondary | ICD-10-CM

## 2020-05-16 DIAGNOSIS — M5412 Radiculopathy, cervical region: Secondary | ICD-10-CM | POA: Diagnosis not present

## 2020-05-16 MED ORDER — DAPAGLIFLOZIN PROPANEDIOL 10 MG PO TABS: ORAL_TABLET | ORAL | 2 refills | Status: DC

## 2020-05-16 NOTE — Telephone Encounter (Signed)
Pls contact pharmacy

## 2020-05-16 NOTE — Telephone Encounter (Signed)
Spoke with the patient.  Appt sch with Dr. Sharon Seller on 05/23/2020 @ 10:45 AM.

## 2020-05-16 NOTE — Telephone Encounter (Signed)
RTC to pharmacy.  Wilder Glade was sent on 05/09/20 by Dr. Allyson Sabal and RX will not go through d/t medicaid #.  Requesting another MD please resend the RX. Thanks, SChaplin, RN,BSN

## 2020-05-23 ENCOUNTER — Ambulatory Visit (INDEPENDENT_AMBULATORY_CARE_PROVIDER_SITE_OTHER): Payer: Medicaid Other | Admitting: Internal Medicine

## 2020-05-23 ENCOUNTER — Encounter: Payer: Self-pay | Admitting: Internal Medicine

## 2020-05-23 VITALS — BP 129/67 | HR 98 | Temp 98.4°F | Wt 309.2 lb

## 2020-05-23 DIAGNOSIS — I4821 Permanent atrial fibrillation: Secondary | ICD-10-CM | POA: Diagnosis not present

## 2020-05-23 DIAGNOSIS — I10 Essential (primary) hypertension: Secondary | ICD-10-CM

## 2020-05-23 DIAGNOSIS — R0981 Nasal congestion: Secondary | ICD-10-CM | POA: Diagnosis not present

## 2020-05-23 DIAGNOSIS — R7303 Prediabetes: Secondary | ICD-10-CM | POA: Diagnosis not present

## 2020-05-23 DIAGNOSIS — I5032 Chronic diastolic (congestive) heart failure: Secondary | ICD-10-CM | POA: Diagnosis not present

## 2020-05-23 DIAGNOSIS — J309 Allergic rhinitis, unspecified: Secondary | ICD-10-CM | POA: Insufficient documentation

## 2020-05-23 DIAGNOSIS — F17200 Nicotine dependence, unspecified, uncomplicated: Secondary | ICD-10-CM

## 2020-05-23 DIAGNOSIS — R062 Wheezing: Secondary | ICD-10-CM

## 2020-05-23 DIAGNOSIS — I4892 Unspecified atrial flutter: Secondary | ICD-10-CM

## 2020-05-23 DIAGNOSIS — I5033 Acute on chronic diastolic (congestive) heart failure: Secondary | ICD-10-CM | POA: Diagnosis not present

## 2020-05-23 DIAGNOSIS — I4891 Unspecified atrial fibrillation: Secondary | ICD-10-CM

## 2020-05-23 MED ORDER — POTASSIUM CHLORIDE CRYS ER 20 MEQ PO TBCR: 40.0000 meq | EXTENDED_RELEASE_TABLET | Freq: Two times a day (BID) | ORAL | 1 refills | Status: DC

## 2020-05-23 MED ORDER — DILTIAZEM HCL ER COATED BEADS 240 MG PO CP24: 480.0000 mg | ORAL_CAPSULE | Freq: Every day | ORAL | 1 refills | Status: DC

## 2020-05-23 MED ORDER — ALBUTEROL SULFATE HFA 108 (90 BASE) MCG/ACT IN AERS: 2.0000 | INHALATION_SPRAY | Freq: Four times a day (QID) | RESPIRATORY_TRACT | 2 refills | Status: AC | PRN

## 2020-05-23 MED ORDER — METOLAZONE 2.5 MG PO TABS: 2.5000 mg | ORAL_TABLET | ORAL | 1 refills | Status: DC

## 2020-05-23 MED ORDER — FUROSEMIDE 80 MG PO TABS: 80.0000 mg | ORAL_TABLET | Freq: Two times a day (BID) | ORAL | 1 refills | Status: DC

## 2020-05-23 MED ORDER — LORATADINE 10 MG PO TABS: ORAL_TABLET | ORAL | 3 refills | Status: DC

## 2020-05-23 MED ORDER — DAPAGLIFLOZIN PROPANEDIOL 10 MG PO TABS: ORAL_TABLET | ORAL | 2 refills | Status: DC

## 2020-05-23 MED ORDER — RIVAROXABAN 20 MG PO TABS: ORAL_TABLET | ORAL | 1 refills | Status: DC

## 2020-05-23 MED ORDER — FLUTICASONE PROPIONATE 50 MCG/ACT NA SUSP: 1.0000 | Freq: Every day | NASAL | 2 refills | Status: DC

## 2020-05-23 NOTE — Patient Instructions (Signed)
Thank you for allowing [place] to provide your care today. Today we discussed your hypertension, allergies, and tobacco use  I have ordered the following labs for you:  Basic metabolic panel, magnesium, and hemoglobin a1c   I will call if any are abnormal.     Please follow-up in six months.     Please call the internal medicine center clinic if you have any questions or concerns, we may be able to help and keep you from a long and expensive emergency room wait. Our clinic and after hours phone number is [phone], the best time to call is Monday through Friday 9 am to 4 pm but there is always someone available 24/7 if you have an emergency. If you need medication refills please notify your pharmacy one week in advance and they will send [place] a request.

## 2020-05-23 NOTE — Assessment & Plan Note (Signed)
Counseled on smoking cessation today. See "nasal congestion."

## 2020-05-23 NOTE — Assessment & Plan Note (Signed)
Last a1c 6 11/2019. Recheck a1c today.

## 2020-05-23 NOTE — Assessment & Plan Note (Signed)
BP Readings from Last 3 Encounters:  05/23/20 129/67  02/18/20 120/74  02/14/20 127/72   Blood pressure and chronic heart failure controlled. Medications include farxiga 10 mg, lasix 80 mg bid, metolazone 2.5 mg 3 times per week, Klor-con 40 mEq bid, and diltiazem 240 mg q24hr. He follows with cardiology.  He has baseline shortness of breath on his 3L supplemental oxygen. No LE swelling and he states he is at his dry weight. On chart review he is at his lowest weight at 309 lbs.    - refilled current medications. - check BMP, Mg

## 2020-05-23 NOTE — Assessment & Plan Note (Signed)
Chronic atrial fibrillation. No shortness of breath or feelings of palpitations. Rate controlled on dilt 240 mg xr  - refill xarelto  - refill dilt

## 2020-05-23 NOTE — Progress Notes (Signed)
   CC: hypertension  HPI:  Frank Morales is a 60 y.o. with PMH as below.   Please see A&P for assessment of the patient's acute and chronic medical conditions.   Past Medical History:  Diagnosis Date  . Acute exacerbation of CHF (congestive heart failure) ([place]) 12/15/2019  . Acute heart failure ([place]) 12/16/2019  . Atrial fibrillation and flutter ([place]) 08/17/2015   A. S/p failed DCCV // b. Severe BAE on echo >> rate control strategy (has seen AF clinic) // c. Xarelto for anticoag (CHADS2-VASc=2 / CHF, HTN)  . Chronic diastolic CHF (congestive heart failure) ([place]) 08/30/2015   A. Echo 6/17: Apical HK, moderate focal basal and mild concentric LVH, EF 50-55, diffuse HK, trivial MR, severe BAE, mild TR, PASP 37  . Dependence on continuous supplemental oxygen    3L  . Hepatitis C, chronic ([place]) 08/19/2015  . History of cardiac catheterization    a. LHC 6/17: LAD irregs, o/w no CAD  . Hypertension   . Hyposmia 12/22/2017  . OSA (obstructive sleep apnea) 11/21/2015   No Cpap  . Pleural effusion on right 03/14/2018  . Sleep apnea   . Tobacco abuse    Review of Systems:   Review of Systems  Constitutional: Negative for chills and fever.  HENT: Positive for congestion. Negative for ear pain, sinus pain and sore throat.   Respiratory: Positive for shortness of breath. Negative for cough and wheezing.        Baseline SOB  Cardiovascular: Negative for chest pain, palpitations and leg swelling.  Gastrointestinal: Negative for abdominal pain, constipation, diarrhea and nausea.  Neurological: Negative for dizziness, tingling, tremors, sensory change and weakness.   Physical Exam: Constitution: NAD, appears stated age, obese HENT: /AT Cardio: irregular rate & rhythm, no m/r/g, no LE edema  Respiratory: CTA, no w/r/r Abdominal: NTTP, soft, non-distended MSK: moving all extremities Neuro: normal affect, a&ox3 Skin: c/d/i    Vitals:   05/23/20 1054  BP: 129/67  Pulse: 98  Temp: 98.4  F (36.9 C)  TempSrc: Oral  SpO2: 100%  Weight: (!) 309 lb 3.2 oz (140.3 kg)    Assessment & Plan:   See Encounters Tab for problem based charting.  Patient discussed with Dr. Philipp Ovens

## 2020-05-23 NOTE — Assessment & Plan Note (Signed)
He has chronic nasal congestion for which he is seeing ENT as this sometimes interferes with his supplemental oxygen delivery. He continues to smoke. He has tried to quit and used gum and patches. We discussed possibility of medication today, but he states he is not quite ready to quit. Explained that tobacco use will worsen his symptoms of shortness of breath and congestion as well.   - cont. Tobacco counseling at f/u - f/u with ENT - refilled claritin and flonase

## 2020-05-24 LAB — BMP8+ANION GAP
Anion Gap: 20 mmol/L — ABNORMAL HIGH (ref 10.0–18.0)
BUN/Creatinine Ratio: 17 (ref 9–20)
BUN: 20 mg/dL (ref 6–24)
CO2: 30 mmol/L — ABNORMAL HIGH (ref 20–29)
Calcium: 9.4 mg/dL (ref 8.7–10.2)
Chloride: 84 mmol/L — ABNORMAL LOW (ref 96–106)
Creatinine, Ser: 1.16 mg/dL (ref 0.76–1.27)
Glucose: 103 mg/dL — ABNORMAL HIGH (ref 65–99)
Potassium: 3.5 mmol/L (ref 3.5–5.2)
Sodium: 134 mmol/L (ref 134–144)
eGFR: 73 mL/min/{1.73_m2} (ref >59)

## 2020-05-24 LAB — HEMOGLOBIN A1C
Est. average glucose Bld gHb Est-mCnc: 128 mg/dL
Hgb A1c MFr Bld: 6.1 % — ABNORMAL HIGH (ref 4.8–5.6)

## 2020-05-24 LAB — MAGNESIUM: Magnesium: 2.4 mg/dL — ABNORMAL HIGH (ref 1.6–2.3)

## 2020-05-25 NOTE — Progress Notes (Signed)
Internal Medicine Clinic Attending  Case discussed with Dr. Seawell  At the time of the visit.  We reviewed the resident's history and exam and pertinent patient test results.  I agree with the assessment, diagnosis, and plan of care documented in the resident's note.  

## 2020-05-29 ENCOUNTER — Other Ambulatory Visit: Payer: Self-pay

## 2020-06-05 ENCOUNTER — Other Ambulatory Visit: Payer: Self-pay

## 2020-06-05 NOTE — Patient Instructions (Signed)
Visit Information  Frank Morales was given information about Medicaid Managed Care team care coordination services as a part of their [place] Medicaid benefit. Frank Morales verbally consented to engagement with the [facility] Managed Care team.   For questions related to your [facility], please call: [phone] or visit the homepage here: [email or web address]  If you would like to schedule transportation through your [facility], please call the following number at least 2 days in advance of your appointment: [phone].   Call the [place] at [phone], at any time, 24 hours a day, 7 days a week. If you are in danger or need immediate medical attention call 911.  Frank Morales - following are the goals we discussed in your visit today:  Goals Addressed   None     Please see education materials related to Dm provided as print materials.   Patient verbalizes understanding of instructions provided today.   The Managed Medicaid care management team will reach out to the patient again over the next 90 days.   Frank Morales, Pharm.D., Managed Medicaid Pharmacist [phone]   Following is a copy of your plan of care:  Patient Care Plan: Heart Failure (Adult)    Problem Identified: Symptom Exacerbation (Heart Failure)     Long-Range Goal: Symptom Exacerbation Prevented or Minimized   Start Date: 04/20/2020  Expected End Date: 08/18/2020  This Visit's Progress: On track  Priority: High  Note:   Current Barriers:  Marland Kitchen Knowledge deficit related to basic heart failure pathophysiology and self care management-Frank Morales is managing his CHF and HTN by taking prescribed medications, cutting out salt, eating more fruits and vegetables and working on smoking cessation. He is down to 1/2 pack per day from 1 pack. His weight has improved from 375  to 310 pounds. He is on 3L of oxygen, which he wears most of the time. He weighs and checks his BP daily. BP today was 123/84. He is not sleeping well at night and has to sit in a recliner to sleep. He has had a sleep study in the past, CPAP was recommended. Frank Morales declines to use CPAP.  Marland Kitchen Financial strain . Housing barriers-Currently lives with his uncle. Patient would like a place of his own Case Manager Clinical Goal(s):  Marland Kitchen Over the next 60 days, patient will work with Frank Morales addressing any barriers to managing his health . Over the next 60 days, patient will take all Heart Failure mediations as prescribed . Over the next 60 days, patient will weigh daily and record (notifying MD of 3 lb weight gain over night or 5 lb in a week) . Over the next 30 days, patient will work with MM Pharmacist for medication management . Over the next 30 days, patient will work with Frank Morales to address housing barriers Interventions:  . Basic overview and discussion of pathophysiology of Heart Failure reviewed  . Provided verbal education on low sodium diet . Provided written education on low sodium diet . Discussed importance of daily weight and advised patient to weigh and record daily . Reviewed role of diuretics in prevention of fluid overload and management of heart failure . Referral to MM Pharmacist . Referral to MM Care Guide Patient Goals/Self-Care Activities . Over the next 60 days, patient will:   . - call office if I gain more than 2 pounds in one day or 5 pounds in one week . - keep  legs up while sitting . - track weight in diary . - use salt in moderation . - watch for swelling in feet, ankles and legs every day . - weigh myself daily  . - begin a heart failure diary . - bring diary to all appointments . - eat more whole grains, fruits and vegetables, lean meats and healthy fats . - know when to call the doctor . - track symptoms and what helps feel better or worse . - dress right for  the weather, hot or cold  . - continue to work on smoking cessation, call 1-800-QUIT-NOW for assistance . - call to schedule follow up appointment with your PCP  Follow Up Plan: Telephone follow up appointment with care management team member scheduled for:06/19/20 @ 9am    Patient Care Plan: Medication Management    Problem Identified: Health Promotion or Disease Self-Management (General Plan of Care)     Goal: Medication Management   Note:   Current Barriers:  . Unable to independently afford treatment regimen . Does not maintain contact with provider office . Does not contact provider office for questions/concerns .   Pharmacist Clinical Goal(s):  Marland Kitchen Over the next 30 days, patient will achieve adherence to monitoring guidelines and medication adherence to achieve therapeutic efficacy . contact provider office for questions/concerns as evidenced notation of same in electronic health record through collaboration with PharmD and provider.  .   Interventions: . Inter-disciplinary care team collaboration (see longitudinal plan of care) . Comprehensive medication review performed; medication list updated in electronic medical record  @RXCPDIABETES @ @RXCPHYPERTENSION @  Patient Goals/Self-Care Activities . Over the next 30 days, patient will:  - take medications as prescribed collaborate with provider on medication access solutions  Follow Up Plan: The care management team will reach out to the patient again over the next 30 days.     Task: Mutually Develop and Frank Morales Achievement of Patient Goals   Note:   Care Management Activities:    - verbalization of feelings encouraged    Notes:

## 2020-06-05 NOTE — Patient Outreach (Signed)
Medicaid Managed Care    Pharmacy Note  06/05/2020 Name: Frank Morales MRN: [id number] DOB: [birth year]  Frank Morales is a 60 y.o. year old male who is a primary care patient of Sherry Ruffing, Adela Lank, MD. The [facility] Managed Care Coordination team was consulted for assistance with disease management and care coordination needs.    Engaged with patient Engaged with patient by telephone for initial visit in response to referral for case management and/or care coordination services.  Frank Morales was given information about Managed Medicaid Care Coordination team services today. Frank Morales agreed to services and verbal consent obtained.   Objective:  Lab Results  Component Value Date   CREATININE 1.16 05/23/2020   CREATININE 0.70 (L) 02/14/2020   CREATININE 0.62 (L) 12/22/2019    Lab Results  Component Value Date   HGBA1C 6.1 (H) 05/23/2020       Component Value Date/Time   CHOL 151 12/16/2019 0807   TRIG 77 12/16/2019 0807   HDL 39 (L) 12/16/2019 0807   CHOLHDL 3.9 12/16/2019 0807   VLDL 15 12/16/2019 0807   LDLCALC 97 12/16/2019 0807    Other: (TSH, CBC, Vit D, etc.)  Clinical ASCVD: Yes  The 10-year ASCVD risk score Frank Bussing DC Jr., et al., 2013) is: 20.6%   Values used to calculate the score:     Age: 48 years     Sex: Male     Is Non-Hispanic African American: Yes     Diabetic: No     Tobacco smoker: Yes     Systolic Blood Pressure: 244 mmHg     Is BP treated: Yes     HDL Cholesterol: 39 mg/dL     Total Cholesterol: 151 mg/dL    Other: (CHADS2VASc if Afib, PHQ9 if depression, MMRC or CAT for COPD, ACT, DEXA)  BP Readings from Last 3 Encounters:  05/23/20 129/67  02/18/20 120/74  02/14/20 127/72    Assessment/Interventions: Review of patient past medical history, allergies, medications, health status, including review of consultants reports, laboratory and other test data, was performed as part of comprehensive evaluation and provision of chronic  care management services.   Smoking  PPD -March 2022: Not ready to quit. Plan: Will be available whenever he's ready  Cardio Home Readings: 126/85, 123/84, 131/89, 122/87 Weigt: 326/322/327/330/336/327/327/320 Diltiazem 240mg  (Needs refills) Furosemide 80mg  BID Metolazone KCl  Xarelto Plan: At goal,  patient stable/ symptoms controlled    Pre-DM Lab Results  Component Value Date   HGBA1C 6.1 (H) 05/23/2020   HGBA1C 6.0 (H) 12/15/2019   HGBA1C 6.0 (A) 10/29/2019   Lab Results  Component Value Date   LDLCALC 97 12/16/2019   CREATININE 1.16 05/23/2020    Lab Results  Component Value Date   NA 134 05/23/2020   K 3.5 05/23/2020   CREATININE 1.16 05/23/2020   GFRNONAA 104 02/14/2020   GFRAA 120 02/14/2020   GLUCOSE 103 (H) 05/23/2020    Farxiga Plan: At goal,  patient stable/ symptoms controlled  Lipids -Not on statin Plan: Will ask Cardio about it   Meds: Patient states he wishes his medications would be 90 days on the 2nd of the month and delivered Plan: Mentioned [facility] but patient would like to think about it. [facility] does not provide these services and he states he has trouble getting refills from them (Always runs out and they have to fax the provider) but not interested in [facility] at this time  SDOH (Social Determinants of Health) assessments and interventions  performed:    Care Plan  Allergies  Allergen Reactions  . Lactose Intolerance (Gi) Nausea And Vomiting    Medications Reviewed Today    Reviewed by Marty Heck, DO (Resident) on 05/23/20 at 1213  Med List Status: <None>  Medication Order Taking? Sig Documenting Provider Last Dose Status Informant  albuterol (VENTOLIN HFA) 108 (90 Base) MCG/ACT inhaler [id number]  Inhale 2 puffs into the lungs every 6 (six) hours as needed for wheezing or shortness of breath. Seawell, Jaimie A, DO  Active   azelastine (ASTELIN) 0.1 % nasal spray [id number] No INSTILL 2 SPRAYS INTO BOTH NOSTRILS TWICE  DAILY  Patient taking differently: Place 2 sprays into both nostrils 2 (two) times daily.   Sid Falcon, MD Taking Active Self           Med Note Tamala Julian, JEFFREY W   Thu Oct 21, 2019  5:37 AM)    dapagliflozin propanediol (FARXIGA) 10 MG TABS tablet [id number]  TAKE 1 TABLET(10 MG) BY MOUTH DAILY BEFORE BREAKFAST Seawell, Jaimie A, DO  Active   diltiazem (CARDIZEM CD) 240 MG 24 hr capsule [id number]  Take 2 capsules (480 mg total) by mouth daily. Seawell, Jaimie A, DO  Active   fluticasone (FLONASE) 50 MCG/ACT nasal spray [id number]  Place 1 spray into both nostrils daily. Seawell, Jaimie A, DO  Active   furosemide (LASIX) 80 MG tablet [id number]  Take 1 tablet (80 mg total) by mouth 2 (two) times daily. Seawell, Jaimie A, DO  Active   loratadine (CLARITIN) 10 MG tablet [id number]  TAKE 1 TABLET(10 MG) BY MOUTH DAILY Seawell, Jaimie A, DO  Active   metolazone (ZAROXOLYN) 2.5 MG tablet [id number]  Take 1 tablet (2.5 mg total) by mouth 3 (three) times a week. Monday, Wednesday, Friday Seawell, Jaimie A, DO  Active   potassium chloride SA (KLOR-CON) 20 MEQ tablet [id number]  Take 2 tablets (40 mEq total) by mouth 2 (two) times daily. Seawell, Jaimie A, DO  Active   rivaroxaban (XARELTO) 20 MG TABS tablet [id number]  TAKE 1 TABLET(20 MG) BY MOUTH DAILY WITH SUPPER Seawell, Jaimie A, DO  Active           Patient Active Problem List   Diagnosis Date Noted  . Nasal congestion 05/23/2020  . Prediabetes 05/23/2020  . Left cervical radiculopathy 10/31/2019  . Healthcare maintenance 07/20/2019  . Permanent atrial fibrillation   . Chronic dental pain 08/19/2016  . Grade I internal hemorrhoids   . 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 OSA (obstructive sleep apnea) 11/21/2015  . Chronic diastolic CHF (congestive heart failure) ([place]) 08/30/2015  . Hepatitis C, chronic ([place]) 08/19/2015  . Morbid obesity ([place]) 08/17/2015  . Hypertension 08/17/2015  . Tobacco use disorder 08/17/2015    Conditions to be  addressed/monitored: HTN and DM  Care Plan : Medication Management  Updates made by Lane Hacker, [place] since 05/01/2020 12:00 AM    Problem: Health Promotion or Disease Self-Management (General Plan of Care)     Goal: Medication Management   Note:   Current Barriers:  . Unable to independently afford treatment regimen . Does not maintain contact with provider office . Does not contact provider office for questions/concerns .   Pharmacist Clinical Goal(s):  Marland Kitchen Over the next 30 days, patient will achieve adherence to monitoring guidelines and medication adherence to achieve therapeutic efficacy . contact provider office for questions/concerns as evidenced notation of same in electronic health record through collaboration with PharmD and provider.  Marland Kitchen  Interventions: . Inter-disciplinary care team collaboration (see longitudinal plan of care) . Comprehensive medication review performed; medication list updated in electronic medical record  @RXCPDIABETES @ @RXCPHYPERTENSION @  Patient Goals/Self-Care Activities . Over the next 30 days, patient will:  - take medications as prescribed collaborate with provider on medication access solutions  Follow Up Plan: The care management team will reach out to the patient again over the next 30 days.     Task: Mutually Develop and [name] Achievement of Patient Goals   Note:   Care Management Activities:    - verbalization of feelings encouraged    Notes:       Medication Assistance: None required. Patient affirms current coverage meets needs.   Follow up: Agree   Plan: The care management team will reach out to the patient again over the next 30 days.   [place] Constable, Pharm.D., Managed Medicaid Pharmacist - [phone]

## 2020-06-06 DIAGNOSIS — I509 Heart failure, unspecified: Secondary | ICD-10-CM | POA: Diagnosis not present

## 2020-06-06 DIAGNOSIS — I5032 Chronic diastolic (congestive) heart failure: Secondary | ICD-10-CM | POA: Diagnosis not present

## 2020-06-19 ENCOUNTER — Other Ambulatory Visit: Payer: Self-pay | Admitting: *Deleted

## 2020-06-19 NOTE — Patient Instructions (Signed)
Visit Information  [name]  - as a part of your Medicaid benefit, you are eligible for care management and care coordination services at no cost or copay. I was unable to reach you by phone today but would be happy to help you with your health related needs. Please feel free to call me @ [phone].   A member of the Managed Medicaid care management team will reach out to you again over the next 7-14 days.   Lurena Joiner RN, BSN [place]  [employer] manager

## 2020-06-19 NOTE — Patient Outreach (Signed)
Care Coordination  06/19/2020  Frank Morales 09-06-1960 [id number]    Medicaid Managed Care   Unsuccessful Outreach Note  06/19/2020 Name: Frank Morales MRN: [id number] DOB: [birth year]  Referred by: [name], MD Reason for referral : High Risk Managed Medicaid (Unsuccessful RNCM follow up outreach)   An unsuccessful telephone outreach was attempted today. The patient was referred to the case management team for assistance with care management and care coordination.   Follow Up Plan: A HIPAA compliant phone message was left for the patient providing contact information and requesting a return call.  The care management team will reach out to the patient again over the next 7-14 days.   Lurena Joiner RN, BSN [place]  [employer] manager

## 2020-06-21 ENCOUNTER — Ambulatory Visit: Payer: Medicaid Other | Admitting: Physician Assistant

## 2020-06-21 NOTE — Progress Notes (Deleted)
Cardiology Office Note:    Date:  06/21/2020   ID:  Frank Morales, DOB 1961-01-11, MRN [id number]  PCP:  [name], MD  [facility] HeartCare Cardiologist:  Dorris Carnes, MD  [facility] HeartCare Electrophysiologist:  None   Chief Complaint: 3 months follow up   History of Present Illness:    Frank Morales is a 60 y.o. male with a hx of persistent atrial fibrillation/flutter, chronic diastolic heart failure, obstructive sleep apnea, COPD on oxygen chronically, ongoing tobacco smoking, hypertension and substance abuse seen for close follow-up.  Admitted June 2017 for CHF and atrial flutter.  Cardiac catheterization showed no significant CAD.  Echo with LV function of 50 to 55%.  Placed on Xarelto and underwent cardioversion July 2017 but reverted back.  Last echocardiogram April 2021 showed LV function of 55 to 60%.  Past Medical History:  Diagnosis Date  . Acute exacerbation of CHF (congestive heart failure) ([place]) 12/15/2019  . Acute heart failure ([place]) 12/16/2019  . Atrial fibrillation and flutter ([place]) 08/17/2015   A. S/p failed DCCV // b. Severe BAE on echo >> rate control strategy (has seen AF clinic) // c. Xarelto for anticoag (CHADS2-VASc=2 / CHF, HTN)  . Chronic diastolic CHF (congestive heart failure) ([place]) 08/30/2015   A. Echo 6/17: Apical HK, moderate focal basal and mild concentric LVH, EF 50-55, diffuse HK, trivial MR, severe BAE, mild TR, PASP 37  . Dependence on continuous supplemental oxygen    3L  . Hepatitis C, chronic ([place]) 08/19/2015  . History of cardiac catheterization    a. LHC 6/17: LAD irregs, o/w no CAD  . Hypertension   . Hyposmia 12/22/2017  . OSA (obstructive sleep apnea) 11/21/2015   No Cpap  . Pleural effusion on right 03/14/2018  . Sleep apnea   . Tobacco abuse     Past Surgical History:  Procedure Laterality Date  . CARDIAC CATHETERIZATION N/A 08/21/2015   Procedure: Right/Left Heart Cath and Coronary Angiography;  Surgeon: Leonie Man, MD;   Location: [facility] CV LAB;  Service: Cardiovascular;  Laterality: N/A;  . CARDIOVERSION N/A 09/22/2015   Procedure: CARDIOVERSION;  Surgeon: Josue Hector, MD;  Location: [facility] ENDOSCOPY;  Service: Cardiovascular;  Laterality: N/A;  . COLONOSCOPY N/A 07/19/2016   Procedure: COLONOSCOPY;  Surgeon: Doran Stabler, MD;  Location: [facility] ENDOSCOPY;  Service: Gastroenterology;  Laterality: N/A;  . ESOPHAGOGASTRODUODENOSCOPY N/A 07/19/2016   Procedure: ESOPHAGOGASTRODUODENOSCOPY (EGD);  Surgeon: Doran Stabler, MD;  Location: [facility] ENDOSCOPY;  Service: Gastroenterology;  Laterality: N/A;  . IR THORACENTESIS ASP PLEURAL SPACE W/IMG GUIDE  07/09/2019  . NO PAST SURGERIES    . SINUS ENDO WITH FUSION N/A 10/02/2018   Procedure: SINUS ENDO WITH FUSION;  Surgeon: Melissa Montane, MD;  Location: [place];  Service: ENT;  Laterality: N/A;    Current Medications: No outpatient medications have been marked as taking for the 06/21/20 encounter (Appointment) with Leanor Kail, [place].     Allergies:   Lactose intolerance (gi)   Social History   Socioeconomic History  . Marital status: Single    Spouse name: Not on file  . Number of children: Not on file  . Years of education: Not on file  . Highest education level: Not on file  Occupational History  . Occupation: unemployed    Comment: Previous Training and development officer   Tobacco Use  . Smoking status: Current Every Day Smoker    Packs/day: 0.20    Types: Cigarettes  . Smokeless tobacco: Never  Used  . Tobacco comment: 3-4cigs/day  Vaping Use  . Vaping Use: Never used  Substance and Sexual Activity  . Alcohol use: Yes    Alcohol/week: 5.0 standard drinks    Types: 5 Standard drinks or equivalent per week    Comment: 2 times a week beer  . Drug use: No  . Sexual activity: Not on file  Other Topics Concern  . Not on file  Social History Narrative  . Not on file   Social Determinants of Health   Financial Resource Strain: Not on file  Food Insecurity: Not on file   Transportation Needs: Not on file  Physical Activity: Not on file  Stress: Not on file  Social Connections: Not on file     Family History: The patient's family history includes Diabetes in his mother; Hypertension in his maternal grandfather and mother; Pneumonia in his father. There is no history of Colon cancer.  ***  ROS:   Please see the history of present illness.    All other systems reviewed and are negative. ***  EKGs/Labs/Other Studies Reviewed:    The following studies were reviewed today:  Echo 06/2019 1. Left ventricular ejection fraction, by estimation, is 55 to 60%. The  left ventricle has normal function. The left ventricle has no regional  wall motion abnormalities. There is mild concentric left ventricular  hypertrophy. Left ventricular diastolic  function could not be evaluated.  2. Right ventricular systolic function was not well visualized. The right  ventricular size is not well visualized.  3. The mitral valve is normal in structure. Trivial mitral valve  regurgitation. No evidence of mitral stenosis.  4. The aortic valve is grossly normal. Aortic valve regurgitation is not  visualized. No aortic stenosis is present.  5. Aortic dilatation noted. There is mild dilatation of the ascending  aorta measuring 39 mm.  6. The inferior vena cava is dilated in size with >50% respiratory  variability, suggesting right atrial pressure of 8 mmHg.   Conclusion(s)/Recommendation(s): Technically difficult images, even with  use of echo contrast. Grossly normal LVEF without clear focal wall motion  abnormalities, though reduced sensitivity on this study. RV not well  visualize/pressures not able to be  assessed. There is a very small posterior pericardial effusion, but IVC  collapses, suggesting there is no echo evidence of tamponade.   Right/Left Heart Cath and Coronary Angiography 08/2015    Conclusion   Angiographically minimal coronary artery disease.  Large draping vessels. Several very tortuous  Mildly elevated secondary pulmonary hypertension   Mildly elevated signal April medication for elevated LVEDP. Suggest more diastolic dysfunction   EKG:  EKG is *** ordered today.  The ekg ordered today demonstrates ***  Recent Labs: 12/15/2019: B Natriuretic Peptide 120.4 12/17/2019: ALT 17; Hemoglobin 12.5; Platelets 201 05/23/2020: BUN 20; Creatinine, Ser 1.16; Magnesium 2.4; Potassium 3.5; Sodium 134  Recent Lipid Panel    Component Value Date/Time   CHOL 151 12/16/2019 0807   TRIG 77 12/16/2019 0807   HDL 39 (L) 12/16/2019 0807   CHOLHDL 3.9 12/16/2019 0807   VLDL 15 12/16/2019 0807   LDLCALC 97 12/16/2019 0807     Risk Assessment/Calculations:   {Does this patient have ATRIAL FIBRILLATION?:[phone]}   Physical Exam:    VS:  There were no vitals taken for this visit.    Wt Readings from Last 3 Encounters:  05/23/20 (!) 309 lb 3.2 oz (140.3 kg)  02/18/20 (!) 315 lb 6.4 oz (143.1 kg)  02/14/20 (!) 319  lb 12.8 oz (145.1 kg)     GEN: *** Well nourished, well developed in no acute distress HEENT: Normal NECK: No JVD; No carotid bruits LYMPHATICS: No lymphadenopathy CARDIAC: ***RRR, no murmurs, rubs, gallops RESPIRATORY:  Clear to auscultation without rales, wheezing or rhonchi  ABDOMEN: Soft, non-tender, non-distended MUSCULOSKELETAL:  No edema; No deformity  SKIN: Warm and dry NEUROLOGIC:  Alert and oriented x 3 PSYCHIATRIC:  Normal affect   ASSESSMENT AND PLAN:    1. ***  2. ***  Medication Adjustments/Labs and Tests Ordered: Current medicines are reviewed at length with the patient today.  Concerns regarding medicines are outlined above.  No orders of the defined types were placed in this encounter.  No orders of the defined types were placed in this encounter.   There are no Patient Instructions on file for this visit.   Jarrett Soho, PA  06/21/2020 8:22 AM    [facility]  HeartCare

## 2020-06-22 DIAGNOSIS — M5412 Radiculopathy, cervical region: Secondary | ICD-10-CM | POA: Diagnosis not present

## 2020-06-22 DIAGNOSIS — M4802 Spinal stenosis, cervical region: Secondary | ICD-10-CM | POA: Diagnosis not present

## 2020-07-07 DIAGNOSIS — I5032 Chronic diastolic (congestive) heart failure: Secondary | ICD-10-CM | POA: Diagnosis not present

## 2020-07-07 DIAGNOSIS — I509 Heart failure, unspecified: Secondary | ICD-10-CM | POA: Diagnosis not present

## 2020-08-06 DIAGNOSIS — I509 Heart failure, unspecified: Secondary | ICD-10-CM | POA: Diagnosis not present

## 2020-08-06 DIAGNOSIS — I5032 Chronic diastolic (congestive) heart failure: Secondary | ICD-10-CM | POA: Diagnosis not present

## 2020-08-12 ENCOUNTER — Encounter: Payer: Self-pay | Admitting: *Deleted

## 2020-08-17 ENCOUNTER — Other Ambulatory Visit: Payer: Self-pay | Admitting: Internal Medicine

## 2020-08-17 NOTE — Telephone Encounter (Signed)
Need refill on potassium chloride SA (KLOR-CON) 20 MEQ tablet ;pt contact [phone]  [facility] [phone] - [place], [place] - [address] AT [place]

## 2020-09-06 DIAGNOSIS — I5032 Chronic diastolic (congestive) heart failure: Secondary | ICD-10-CM | POA: Diagnosis not present

## 2020-09-06 DIAGNOSIS — I509 Heart failure, unspecified: Secondary | ICD-10-CM | POA: Diagnosis not present

## 2020-10-02 ENCOUNTER — Other Ambulatory Visit: Payer: Self-pay

## 2020-10-02 NOTE — Patient Instructions (Signed)
Visit Information  Frank Morales was given information about Medicaid Managed Care team care coordination services as a part of their [place] Medicaid benefit. Vimal Derego Englert verbally consented to engagement with the [facility] Managed Care team.   For questions related to your [facility], please call: [phone] or visit the homepage here: [email or web address]  If you would like to schedule transportation through your [facility], please call the following number at least 2 days in advance of your appointment: [phone].   Call the [facility] at [phone], at any time, 24 hours a day, 7 days a week. If you are in danger or need immediate medical attention call 911.  If you would like help to quit smoking, call 1-800-QUIT-NOW (913)573-1821) OR Espaol: 1-855-Djelo-Ya (9-509-326-7124) o para ms informacin haga clic aqu or Text READY to 200-400 to register via text  Frank. Splawn - following are the goals we discussed in your visit today:   Goals Addressed   None     Please see education materials related to DM provided as print materials.   Patient verbalizes understanding of instructions provided today.   The Managed Medicaid care management team will reach out to the patient again over the next 120 days.   [place] Constable, Pharm.D., Managed Medicaid Pharmacist [phone]   Following is a copy of your plan of care:  Patient Care Plan: Heart Failure (Adult)     Problem Identified: Symptom Exacerbation (Heart Failure)      Long-Range Goal: Symptom Exacerbation Prevented or Minimized   Start Date: 04/20/2020  Expected End Date: 08/18/2020  This Visit's Progress: On track  Priority: High  Note:   Current Barriers:  Knowledge deficit related to basic heart failure pathophysiology and self care management-Frank Morales is managing  his CHF and HTN by taking prescribed medications, cutting out salt, eating more fruits and vegetables and working on smoking cessation. He is down to 1/2 pack per day from 1 pack. His weight has improved from 375 to 310 pounds. He is on 3L of oxygen, which he wears most of the time. He weighs and checks his BP daily. BP today was 123/84. He is not sleeping well at night and has to sit in a recliner to sleep. He has had a sleep study in the past, CPAP was recommended. Frank Faivre declines to use CPAP.  Financial strain Housing barriers-Currently lives with his uncle. Patient would like a place of his own Case Manager Clinical Goal(s):  Over the next 60 days, patient will work with [facility] addressing any barriers to managing his health Over the next 60 days, patient will take all Heart Failure mediations as prescribed Over the next 60 days, patient will weigh daily and record (notifying MD of 3 lb weight gain over night or 5 lb in a week) Over the next 30 days, patient will work with [facility] Pharmacist for medication management Over the next 30 days, patient will work with [place] to address housing barriers Interventions:  Basic overview and discussion of pathophysiology of Heart Failure reviewed  Provided verbal education on low sodium diet Provided written education on low sodium diet Discussed importance of daily weight and advised patient to weigh and record daily Reviewed role of diuretics in prevention of fluid overload and management of heart failure Referral to [facility] Pharmacist Referral to [place] Patient Goals/Self-Care Activities Over the next 60 days, patient will:   - call office if  I gain more than 2 pounds in one day or 5 pounds in one week - keep legs up while sitting - track weight in diary - use salt in moderation - watch for swelling in feet, ankles and legs every day - weigh myself daily  - begin a heart failure diary - bring diary to all appointments - eat more whole  grains, fruits and vegetables, lean meats and healthy fats - know when to call the doctor - track symptoms and what helps feel better or worse - dress right for the weather, hot or cold  - continue to work on smoking cessation, call 1-800-QUIT-NOW for assistance - call to schedule follow up appointment with your PCP  Follow Up Plan: Telephone follow up appointment with care management team member scheduled for:06/19/20 @ 9am     Patient Care Plan: Medication Management     Problem Identified: Health Promotion or Disease Self-Management (General Plan of Care)      Goal: Medication Management   Note:   Current Barriers:  Unable to independently afford treatment regimen Does not maintain contact with provider office Does not contact provider office for questions/concerns   Pharmacist Clinical Goal(s):  Over the next 30 days, patient will achieve adherence to monitoring guidelines and medication adherence to achieve therapeutic efficacy contact provider office for questions/concerns as evidenced notation of same in electronic health record through collaboration with PharmD and provider.    Interventions: Inter-disciplinary care team collaboration (see longitudinal plan of care) Comprehensive medication review performed; medication list updated in electronic medical record  @RXCPDIABETES @ @RXCPHYPERTENSION @  Patient Goals/Self-Care Activities Over the next 30 days, patient will:  - take medications as prescribed collaborate with provider on medication access solutions  Follow Up Plan: The care management team will reach out to the patient again over the next 30 days.      Task: Mutually Develop and [name] Achievement of Patient Goals   Note:   Care Management Activities:    - verbalization of feelings encouraged    Notes:

## 2020-10-02 NOTE — Patient Outreach (Signed)
Medicaid Managed Care    Pharmacy Note  10/02/2020 Name: Frank Morales MRN: [id number] DOB: 04-16-1960  Frank Morales is a 60 y.o. year old male who is a primary care patient of Frank Morales, Frank Lank, MD. The [facility] Managed Care Coordination team was consulted for assistance with disease management and care coordination needs.    Engaged with patient Engaged with patient by telephone for initial visit in response to referral for case management and/or care coordination services.  Frank Morales was given information about Managed Medicaid Care Coordination team services today. Frank Morales agreed to services and verbal consent obtained.   Objective:  Lab Results  Component Value Date   CREATININE 1.16 05/23/2020   CREATININE 0.70 (L) 02/14/2020   CREATININE 0.62 (L) 12/22/2019    Lab Results  Component Value Date   HGBA1C 6.1 (H) 05/23/2020       Component Value Date/Time   CHOL 151 12/16/2019 0807   TRIG 77 12/16/2019 0807   HDL 39 (L) 12/16/2019 0807   CHOLHDL 3.9 12/16/2019 0807   VLDL 15 12/16/2019 0807   LDLCALC 97 12/16/2019 0807    Other: (TSH, CBC, Vit D, etc.)  Clinical ASCVD: Yes  The 10-year ASCVD risk score Frank Morales., et al., 2013) is: 20.9%   Values used to calculate the score:     Age: 42 years     Sex: Male     Is Non-Hispanic African American: Yes     Diabetic: No     Tobacco smoker: Yes     Systolic Blood Pressure: 631 mmHg     Is BP treated: Yes     HDL Cholesterol: 39 mg/dL     Total Cholesterol: 151 mg/dL    Other: (CHADS2VASc if Afib, PHQ9 if depression, MMRC or CAT for COPD, ACT, DEXA)  BP Readings from Last 3 Encounters:  05/23/20 129/67  02/18/20 120/74  02/14/20 127/72    Assessment/Interventions: Review of patient past medical history, allergies, medications, health status, including review of consultants reports, laboratory and other test data, was performed as part of comprehensive evaluation and provision of chronic  care management services.   Smoking  PPD -March 2022: Not ready to quit. Plan: Will be available whenever he's ready  Cardio Home Readings: 126/85, 123/84, 131/89, 122/87 Weigt: 326/322/327/330/336/327/327/320 Diltiazem 240mg  (Needs refills) Furosemide 80mg  BID Metolazone KCl  Xarelto Plan: At goal,  patient stable/ symptoms controlled    Pre-DM Lab Results  Component Value Date   HGBA1C 6.1 (H) 05/23/2020   HGBA1C 6.0 (H) 12/15/2019   HGBA1C 6.0 (A) 10/29/2019   Lab Results  Component Value Date   LDLCALC 97 12/16/2019   CREATININE 1.16 05/23/2020    Lab Results  Component Value Date   NA 134 05/23/2020   K 3.5 05/23/2020   CREATININE 1.16 05/23/2020   GFRNONAA 104 02/14/2020   GFRAA 120 02/14/2020   GLUCOSE 103 (H) 05/23/2020    Farxiga July 2022: Patient has been without [place] for 2 weeks. I asked him if I could help and he denied service. Said he'd like to do things on his on.  Lipids -Not on statin Plan: Will ask Cardio about it   Meds: Patient states he wishes his medications would be 90 days on the 2nd of the month and delivered April 2022 Plan: Mentioned [facility] but patient would like to think about it. [facility] does not provide these services and he states he has trouble getting refills from them (Always runs out  and they have to fax the provider) but not interested in [facility] at this time July 2022: Patient without Wilder Glade for 2 weeks but states he would not like my help just yet  SDOH (Social Determinants of Health) assessments and interventions performed:    Care Plan  Allergies  Allergen Reactions   Lactose Intolerance (Gi) Nausea And Vomiting    Medications Reviewed Today     Reviewed by Frank Heck, DO (Resident) on 05/23/20 at 1213  Med List Status: <None>   Medication Order Taking? Sig Documenting Provider Last Dose Status Informant  albuterol (VENTOLIN HFA) 108 (90 Base) MCG/ACT inhaler [id number]  Inhale 2 puffs into  the lungs every 6 (six) hours as needed for wheezing or shortness of breath. Morales, Frank A, DO  Active   azelastine (ASTELIN) 0.1 % nasal spray [id number] No INSTILL 2 SPRAYS INTO BOTH NOSTRILS TWICE DAILY  Patient taking differently: Place 2 sprays into both nostrils 2 (two) times daily.   Frank Falcon, MD Taking Active Self           Med Note Tamala Morales, Frank W   Thu Oct 21, 2019  5:37 AM)    dapagliflozin propanediol (FARXIGA) 10 MG TABS tablet [id number]  TAKE 1 TABLET(10 MG) BY MOUTH DAILY BEFORE BREAKFAST Morales, Frank A, DO  Active   diltiazem (CARDIZEM CD) 240 MG 24 hr capsule [id number]  Take 2 capsules (480 mg total) by mouth daily. Morales, Frank A, DO  Active   fluticasone (FLONASE) 50 MCG/ACT nasal spray [id number]  Place 1 spray into both nostrils daily. Morales, Frank A, DO  Active   furosemide (LASIX) 80 MG tablet [id number]  Take 1 tablet (80 mg total) by mouth 2 (two) times daily. Morales, Frank A, DO  Active   loratadine (CLARITIN) 10 MG tablet [id number]  TAKE 1 TABLET(10 MG) BY MOUTH DAILY Morales, Frank A, DO  Active   metolazone (ZAROXOLYN) 2.5 MG tablet [id number]  Take 1 tablet (2.5 mg total) by mouth 3 (three) times a week. Monday, Wednesday, Friday Morales, Frank A, DO  Active   potassium chloride SA (KLOR-CON) 20 MEQ tablet [id number]  Take 2 tablets (40 mEq total) by mouth 2 (two) times daily. Morales, Frank A, DO  Active   rivaroxaban (XARELTO) 20 MG TABS tablet [id number]  TAKE 1 TABLET(20 MG) BY MOUTH DAILY WITH SUPPER Morales, Frank A, DO  Active             Patient Active Problem List   Diagnosis Date Noted   Nasal congestion 05/23/2020   Prediabetes 05/23/2020   Left cervical radiculopathy 10/31/2019   Healthcare maintenance 07/20/2019   Permanent atrial fibrillation    Chronic dental pain 08/19/2016   Grade I internal hemorrhoids    Hepatic fibrosis 02/29/2016   OSA (obstructive sleep apnea) 11/21/2015   Chronic diastolic CHF (congestive heart  failure) ([place]) 08/30/2015   Hepatitis C, chronic ([place]) 08/19/2015   Morbid obesity ([place]) 08/17/2015   Hypertension 08/17/2015   Tobacco use disorder 08/17/2015    Conditions to be addressed/monitored: HTN and DM  Care Plan : Medication Management  Updates made by Lane Hacker, [place] since 05/01/2020 12:00 AM     Problem: Health Promotion or Disease Self-Management (General Plan of Care)      Goal: Medication Management   Note:   Current Barriers:  Unable to independently afford treatment regimen Does not maintain contact with provider office Does not contact provider office for questions/concerns   Pharmacist Clinical Goal(s):  Over the next 30 days, patient will achieve adherence to monitoring guidelines and medication adherence to achieve therapeutic efficacy contact provider office for questions/concerns as evidenced notation of same in electronic health record through collaboration with PharmD and provider.    Interventions: Inter-disciplinary care team collaboration (see longitudinal plan of care) Comprehensive medication review performed; medication list updated in electronic medical record  @RXCPDIABETES @ @RXCPHYPERTENSION @  Patient Goals/Self-Care Activities Over the next 30 days, patient will:  - take medications as prescribed collaborate with provider on medication access solutions  Follow Up Plan: The care management team will reach out to the patient again over the next 30 days.      Task: Mutually Develop and [name] Achievement of Patient Goals   Note:   Care Management Activities:    - verbalization of feelings encouraged    Notes:       Medication Assistance: None required. Patient affirms current coverage meets needs.   Follow up: Agree   Plan: The care management team will reach out to the patient again over the next 30 days.   [place] Constable, Pharm.D., Managed Medicaid Pharmacist - [phone]

## 2020-10-06 DIAGNOSIS — I5032 Chronic diastolic (congestive) heart failure: Secondary | ICD-10-CM | POA: Diagnosis not present

## 2020-10-06 DIAGNOSIS — I509 Heart failure, unspecified: Secondary | ICD-10-CM | POA: Diagnosis not present

## 2020-10-10 DIAGNOSIS — M4802 Spinal stenosis, cervical region: Secondary | ICD-10-CM | POA: Diagnosis not present

## 2020-10-10 DIAGNOSIS — M5412 Radiculopathy, cervical region: Secondary | ICD-10-CM | POA: Diagnosis not present

## 2020-10-17 ENCOUNTER — Encounter: Payer: Medicaid Other | Admitting: Internal Medicine

## 2020-10-25 ENCOUNTER — Ambulatory Visit (INDEPENDENT_AMBULATORY_CARE_PROVIDER_SITE_OTHER): Payer: Medicaid Other | Admitting: Student

## 2020-10-25 ENCOUNTER — Encounter: Payer: Self-pay | Admitting: Student

## 2020-10-25 DIAGNOSIS — I4891 Unspecified atrial fibrillation: Secondary | ICD-10-CM

## 2020-10-25 DIAGNOSIS — I5033 Acute on chronic diastolic (congestive) heart failure: Secondary | ICD-10-CM | POA: Diagnosis not present

## 2020-10-25 DIAGNOSIS — I4821 Permanent atrial fibrillation: Secondary | ICD-10-CM

## 2020-10-25 DIAGNOSIS — F17200 Nicotine dependence, unspecified, uncomplicated: Secondary | ICD-10-CM

## 2020-10-25 DIAGNOSIS — I10 Essential (primary) hypertension: Secondary | ICD-10-CM | POA: Diagnosis not present

## 2020-10-25 DIAGNOSIS — Z0000 Encounter for general adult medical examination without abnormal findings: Secondary | ICD-10-CM | POA: Diagnosis present

## 2020-10-25 DIAGNOSIS — I5032 Chronic diastolic (congestive) heart failure: Secondary | ICD-10-CM | POA: Diagnosis not present

## 2020-10-25 DIAGNOSIS — R0981 Nasal congestion: Secondary | ICD-10-CM | POA: Diagnosis not present

## 2020-10-25 DIAGNOSIS — I4892 Unspecified atrial flutter: Secondary | ICD-10-CM | POA: Diagnosis not present

## 2020-10-25 MED ORDER — RIVAROXABAN 20 MG PO TABS: ORAL_TABLET | ORAL | 2 refills | Status: DC

## 2020-10-25 MED ORDER — ZOSTER VAC RECOMB ADJUVANTED 50 MCG/0.5ML IM SUSR: 0.5000 mL | Freq: Once | INTRAMUSCULAR | 0 refills | Status: AC

## 2020-10-25 MED ORDER — FUROSEMIDE 80 MG PO TABS: 80.0000 mg | ORAL_TABLET | Freq: Two times a day (BID) | ORAL | 3 refills | Status: DC

## 2020-10-25 MED ORDER — POTASSIUM CHLORIDE CRYS ER 20 MEQ PO TBCR: EXTENDED_RELEASE_TABLET | ORAL | 1 refills | Status: DC

## 2020-10-25 MED ORDER — DILTIAZEM HCL ER COATED BEADS 240 MG PO CP24: 480.0000 mg | ORAL_CAPSULE | Freq: Every day | ORAL | 3 refills | Status: DC

## 2020-10-25 MED ORDER — METOLAZONE 2.5 MG PO TABS: 2.5000 mg | ORAL_TABLET | ORAL | 1 refills | Status: DC

## 2020-10-25 MED ORDER — DAPAGLIFLOZIN PROPANEDIOL 10 MG PO TABS: ORAL_TABLET | ORAL | 3 refills | Status: DC

## 2020-10-25 MED ORDER — AZELASTINE HCL 0.1 % NA SOLN: NASAL | 1 refills | Status: DC

## 2020-10-25 NOTE — Assessment & Plan Note (Signed)
Assessment: BP of 124/76.  Blood pressure well controlled.  Continue medications of Farxiga 10 mg, Lasix 80 mg twice daily, metolazone 2-1/2 mg 3 times daily and work on 40 mEq twice daily as well as diltiazem  480 mg every 24 hours.  Plan: -Continue medications as per above

## 2020-10-25 NOTE — Assessment & Plan Note (Signed)
Assessment: Denies heart failure exacerbation recently.  He is uncertain of his dry weight.  He has been adherent to his medication regimen of Lasix, metolazone, Klor-Con, Farxiga.  Patient denies needing to increase his oxygen supplementation.  Patient will continue to follow with cardiology  Plan: -Continue Lasix 80 mg twice daily, metolazone 2.5 mg 3 times daily weekly, Farxiga 10 mg. -Continue to follow with cardiology

## 2020-10-25 NOTE — Assessment & Plan Note (Signed)
Patient states he will think about getting the COVID booster.  Discussed risk and benefits with him.  We also discussed receiving the Shingrix vaccine, patient was given a handwritten prescription to take to his pharmacy.

## 2020-10-25 NOTE — Progress Notes (Signed)
CC: Hypertension, heart failure, tobacco use  HPI:  Frank Morales is a 60 y.o. male with a past medical history stated below and presents today for follow-up of his chronic medical conditions. Please see problem based assessment and plan for additional details.  Past Medical History:  Diagnosis Date   Acute exacerbation of CHF (congestive heart failure) ([place]) 12/15/2019   Acute heart failure ([place]) 12/16/2019   Atrial fibrillation and flutter ([place]) 08/17/2015   A. S/p failed DCCV // b. Severe BAE on echo >> rate control strategy (has seen AF clinic) // c. Xarelto for anticoag (CHADS2-VASc=2 / CHF, HTN)   Chronic diastolic CHF (congestive heart failure) ([place]) 08/30/2015   A. Echo 6/17: Apical HK, moderate focal basal and mild concentric LVH, EF 50-55, diffuse HK, trivial MR, severe BAE, mild TR, PASP 37   Dependence on continuous supplemental oxygen    3L   Hepatitis C, chronic (HCC) 08/19/2015   History of cardiac catheterization    a. LHC 6/17: LAD irregs, o/w no CAD   Hypertension    Hyposmia 12/22/2017   OSA (obstructive sleep apnea) 11/21/2015   No Cpap   Pleural effusion on right 03/14/2018   Sleep apnea    Tobacco abuse     Current Outpatient Medications on File Prior to Visit  Medication Sig Dispense Refill   albuterol (VENTOLIN HFA) 108 (90 Base) MCG/ACT inhaler Inhale 2 puffs into the lungs every 6 (six) hours as needed for wheezing or shortness of breath. 8 g 2   azelastine (ASTELIN) 0.1 % nasal spray INSTILL 2 SPRAYS INTO BOTH NOSTRILS TWICE DAILY (Patient taking differently: Place 2 sprays into both nostrils 2 (two) times daily.) 90 mL 1   dapagliflozin propanediol (FARXIGA) 10 MG TABS tablet TAKE 1 TABLET(10 MG) BY MOUTH DAILY BEFORE BREAKFAST 30 tablet 2   diltiazem (CARDIZEM CD) 240 MG 24 hr capsule Take 2 capsules (480 mg total) by mouth daily. 180 capsule 1   fluticasone (FLONASE) 50 MCG/ACT nasal spray Place 1 spray into both nostrils daily. 16 g 2   furosemide  (LASIX) 80 MG tablet Take 1 tablet (80 mg total) by mouth 2 (two) times daily. 180 tablet 1   loratadine (CLARITIN) 10 MG tablet TAKE 1 TABLET(10 MG) BY MOUTH DAILY 90 tablet 3   metolazone (ZAROXOLYN) 2.5 MG tablet Take 1 tablet (2.5 mg total) by mouth 3 (three) times a week. Monday, Wednesday, Friday 36 tablet 1   potassium chloride SA (KLOR-CON) 20 MEQ tablet TAKE 2 TABLETS(40 MEQ) BY MOUTH TWICE DAILY 120 tablet 1   rivaroxaban (XARELTO) 20 MG TABS tablet TAKE 1 TABLET(20 MG) BY MOUTH DAILY WITH SUPPER 90 tablet 1   No current facility-administered medications on file prior to visit.    Family History  Problem Relation Age of Onset   Hypertension Mother    Diabetes Mother    Pneumonia Father    Hypertension Maternal Grandfather    Colon cancer Neg Hx     Social History   Socioeconomic History   Marital status: Single    Spouse name: Not on file   Number of children: Not on file   Years of education: Not on file   Highest education level: Not on file  Occupational History   Occupation: unemployed    Comment: Previous cook   Tobacco Use   Smoking status: Every Day    Packs/day: 0.20    Types: Cigarettes   Smokeless tobacco: Never   Tobacco comments:  3-4cigs/day  Vaping Use   Vaping Use: Never used  Substance and Sexual Activity   Alcohol use: Yes    Alcohol/week: 5.0 standard drinks    Types: 5 Standard drinks or equivalent per week    Comment: 2 times a week beer   Drug use: No   Sexual activity: Not on file  Other Topics Concern   Not on file  Social History Narrative   Not on file   Social Determinants of Health   Financial Resource Strain: Not on file  Food Insecurity: Not on file  Transportation Needs: Not on file  Physical Activity: Not on file  Stress: Not on file  Social Connections: Not on file  Intimate Partner Violence: Not on file    Review of Systems: ROS negative except for what is noted on the assessment and plan.  There were no  vitals filed for this visit.   Physical Exam: Constitutional: Well-appearing, no acute distress HENT: normocephalic atraumatic Eyes: conjunctiva non-erythematous Neck: supple Cardiovascular: Irregularly irregular rhythm.  No murmurs gallops rubs.  1+ lower extremity edema Pulmonary/Chest: normal work of breathing on supplemental oxygen, lungs clear to auscultation bilaterally Abdominal: soft, non-tender, non-distended MSK: normal bulk and tone Neurological: alert & oriented x 3 Skin: warm and dry Psych: Normal mood and thought process   Assessment & Plan:   See Encounters Tab for problem based charting.  Patient discussed with Dr. Donnita Falls, D.O. [facility], PGY-2 Pager: [phone], Phone: [phone] Date 10/25/2020 Time 1:43 PM

## 2020-10-25 NOTE — Assessment & Plan Note (Signed)
Assessment: Discussed smoking cessation.  Patient denies needing assistance with quitting at this time but will reach out as needed.  Discussed with him that he should not be smoking around a oxygen tank if he is using 1 at home.  Patient acknowledges understanding.  Plan: -Continue tobacco cessation

## 2020-10-25 NOTE — Patient Instructions (Signed)
Thank you, Mr.Preston for allowing [place] to provide your care today. Today we discussed   High blood pressure Please continue to check your blood pressures at home.  If you notice that they are elevated above 130/80 frequently, please call our clinic.  Congestive heart failure Please continue to take your heart failure medications.  Please call your cardiologist and schedule appointment to follow-up with them soon.  Atrial fibrillation Please continue take your medications for your atrial fibrillation.  If you notice that your heart is racing faster than usual please check your heart rate.  If it is above 100 please call our clinic or if you feel like you are lightheaded dizzy or short of breath please go to the closest emergency department.  Tobacco use Please continue to work on cutting down your cigarette use.  Please do not smoke while you wear your oxygen.  This puts you at high risk for blowing up your oxygen tank.  We are here for you if you need additional services to help in quitting.  Prediabetes Please continue to work on your diet to manage her weight.  You are doing a great job thus far.  We will check your A1c in 1 year.  This is the number we checked to assess for diabetes.  Healthcare maintenance I recommend getting the COVID booster as well as the Shingrix shot.  I have ordered the following labs for you:  Lab Orders  No laboratory test(s) ordered today     Referrals ordered today:   Referral Orders  No referral(s) requested today     I have ordered the following medication/changed the following medications:   Stop the following medications: Medications Discontinued During This Encounter  Medication Reason   azelastine (ASTELIN) 0.1 % nasal spray Reorder   dapagliflozin propanediol (FARXIGA) 10 MG TABS tablet Reorder   diltiazem (CARDIZEM CD) 240 MG 24 hr capsule Reorder   furosemide (LASIX) 80 MG tablet Reorder   rivaroxaban (XARELTO) 20 MG TABS  tablet Reorder   metolazone (ZAROXOLYN) 2.5 MG tablet Reorder   potassium chloride SA (KLOR-CON) 20 MEQ tablet Reorder     Start the following medications: Meds ordered this encounter  Medications   dapagliflozin propanediol (FARXIGA) 10 MG TABS tablet    Sig: TAKE 1 TABLET(10 MG) BY MOUTH DAILY BEFORE BREAKFAST    Dispense:  90 tablet    Refill:  3   azelastine (ASTELIN) 0.1 % nasal spray    Sig: INSTILL 2 SPRAYS INTO BOTH NOSTRILS TWICE DAILY    Dispense:  90 mL    Refill:  1   potassium chloride SA (KLOR-CON) 20 MEQ tablet    Sig: TAKE 2 TABLETS(40 MEQ) BY MOUTH TWICE DAILY    Dispense:  120 tablet    Refill:  1   diltiazem (CARDIZEM CD) 240 MG 24 hr capsule    Sig: Take 2 capsules (480 mg total) by mouth daily.    Dispense:  180 capsule    Refill:  3   furosemide (LASIX) 80 MG tablet    Sig: Take 1 tablet (80 mg total) by mouth 2 (two) times daily.    Dispense:  180 tablet    Refill:  3   metolazone (ZAROXOLYN) 2.5 MG tablet    Sig: Take 1 tablet (2.5 mg total) by mouth 3 (three) times a week. Monday, Wednesday, Friday    Dispense:  36 tablet    Refill:  1    **Patient requests 90 days supply**  rivaroxaban (XARELTO) 20 MG TABS tablet    Sig: TAKE 1 TABLET(20 MG) BY MOUTH DAILY WITH SUPPER    Dispense:  90 tablet    Refill:  2     Follow up: 8-12 months follow up chronic medical conditions.    Should you have any questions or concerns please call the internal medicine clinic at [phone].     Sanjuana Letters, D.O. [place]

## 2020-10-25 NOTE — Assessment & Plan Note (Addendum)
Assessment: Patient has been adherent to his diltiazem and Xarelto.  Denies any racing of his heart, chest pain, shortness of breath.  Regular rate and rhythm on my exam today.  Continue regimen as per above  Plan: -Continue diltiazem 40 mg daily, Xarelto 20 mg daily

## 2020-10-31 NOTE — Progress Notes (Signed)
Internal Medicine Clinic Attending  Case discussed with Dr. Katsadouros  At the time of the visit.  We reviewed the resident's history and exam and pertinent patient test results.  I agree with the assessment, diagnosis, and plan of care documented in the resident's note.  

## 2020-11-06 DIAGNOSIS — I509 Heart failure, unspecified: Secondary | ICD-10-CM | POA: Diagnosis not present

## 2020-11-06 DIAGNOSIS — I5032 Chronic diastolic (congestive) heart failure: Secondary | ICD-10-CM | POA: Diagnosis not present

## 2020-11-07 DIAGNOSIS — M5412 Radiculopathy, cervical region: Secondary | ICD-10-CM | POA: Diagnosis not present

## 2020-12-07 DIAGNOSIS — I509 Heart failure, unspecified: Secondary | ICD-10-CM | POA: Diagnosis not present

## 2020-12-07 DIAGNOSIS — I5032 Chronic diastolic (congestive) heart failure: Secondary | ICD-10-CM | POA: Diagnosis not present

## 2020-12-19 DIAGNOSIS — Z6841 Body Mass Index (BMI) 40.0 and over, adult: Secondary | ICD-10-CM | POA: Diagnosis not present

## 2020-12-19 DIAGNOSIS — M4802 Spinal stenosis, cervical region: Secondary | ICD-10-CM | POA: Diagnosis not present

## 2020-12-19 DIAGNOSIS — M5412 Radiculopathy, cervical region: Secondary | ICD-10-CM | POA: Diagnosis not present

## 2020-12-26 ENCOUNTER — Other Ambulatory Visit: Payer: Self-pay | Admitting: Student

## 2020-12-26 NOTE — Telephone Encounter (Signed)
potassium chloride SA (KLOR-CON) 20 MEQ tablet, REFILL REQUEST @ [facility] [phone] - [place], [place] - [address] AT [place].

## 2021-01-06 DIAGNOSIS — I5032 Chronic diastolic (congestive) heart failure: Secondary | ICD-10-CM | POA: Diagnosis not present

## 2021-01-06 DIAGNOSIS — I509 Heart failure, unspecified: Secondary | ICD-10-CM | POA: Diagnosis not present

## 2021-01-10 ENCOUNTER — Encounter: Payer: Self-pay | Admitting: Internal Medicine

## 2021-01-10 ENCOUNTER — Ambulatory Visit (INDEPENDENT_AMBULATORY_CARE_PROVIDER_SITE_OTHER): Payer: Medicaid Other | Admitting: Internal Medicine

## 2021-01-10 VITALS — BP 128/85 | HR 76 | Wt 332.4 lb

## 2021-01-10 DIAGNOSIS — R1013 Epigastric pain: Secondary | ICD-10-CM

## 2021-01-10 DIAGNOSIS — F17200 Nicotine dependence, unspecified, uncomplicated: Secondary | ICD-10-CM

## 2021-01-10 DIAGNOSIS — R0981 Nasal congestion: Secondary | ICD-10-CM | POA: Diagnosis not present

## 2021-01-10 DIAGNOSIS — R319 Hematuria, unspecified: Secondary | ICD-10-CM | POA: Diagnosis not present

## 2021-01-10 HISTORY — DX: Hematuria, unspecified: R31.9

## 2021-01-10 LAB — POCT URINALYSIS DIPSTICK
Bilirubin, UA: NEGATIVE
Glucose, UA: POSITIVE — AB
Ketones, UA: NEGATIVE
Nitrite, UA: NEGATIVE
Protein, UA: POSITIVE — AB
Spec Grav, UA: 1.015 (ref 1.010–1.025)
Urobilinogen, UA: 2 E.U./dL — AB
pH, UA: 7 (ref 5.0–8.0)

## 2021-01-10 MED ORDER — SIMETHICONE 80 MG PO CHEW: 80.0000 mg | CHEWABLE_TABLET | Freq: Four times a day (QID) | ORAL | 2 refills | Status: DC | PRN

## 2021-01-10 MED ORDER — PANTOPRAZOLE SODIUM 20 MG PO TBEC: 20.0000 mg | DELAYED_RELEASE_TABLET | Freq: Every day | ORAL | 6 refills | Status: DC

## 2021-01-10 NOTE — Assessment & Plan Note (Addendum)
Patient coming in with concerns of peeing blood for one week. Says that he is not certain it is blood but that his urine is darker than normal. Still having a normal amount of urine output, and sometimes his urine does not look as dark as others. He is not having any increased pain with urination, increased pain with defecation, no increased frequency, and is not sexually active and has not been for 9 months.  - f/u ucx - f/u urinalysis - consider urology follow up if sourcce of blood is not identified  Addendum: UCx growing proteus mirabilus, will order one week of antibiotics  - Discussed this over the phone with patient, agreed to medication and will come to appointment for f/u -recheck UA/UCx

## 2021-01-10 NOTE — Assessment & Plan Note (Signed)
Patient continues to smoke but says that he smokes a lot less than he used to. Discussed that it is good that he cut down but there is no level of safe smoking. -continue cessation counseling

## 2021-01-10 NOTE — Patient Instructions (Addendum)
Frank Morales  It was a pleasure seeing you in the clinic today.   We talked about your urine, smoking, stomach pain, and placed an ENT referral for you.   Stomach pain We will have you start a medicine called protonix once a day and you can take a medicine called Gas-x up to 4 times daily as needed for bloating. We will see how you feel in a month.  2. Blood in urine We will test your urine for bacteria today. We will call you if we need to start any antibiotics.  Please call our clinic at [phone] if you have any questions or concerns. The best time to call is Monday-Friday from 9am-4pm, but there is someone available 24/7 at the same number. If you need medication refills, please notify your pharmacy one week in advance and they will send [place] a request.   Thank you for letting us take part in your care. We look forward to seeing you next time!

## 2021-01-10 NOTE — Assessment & Plan Note (Addendum)
Patient complaining of abdominal pain, bloating, and dyspepsia that sometimes occurs after eating. Patient has learned to avoid tomato sauces but eats a lot of chocolate, peppermint, and drinks coffee. Discussed that patient's symptoms may be related to acid reflux, as he endorses sour taste in his mouth after burping. Will try to empirically treat with PPI and assess for improvement in symtpoms. - start protonix 20 daily - Gas-X PRN

## 2021-01-10 NOTE — Progress Notes (Signed)
   CC: Blood in urine  HPI:  [name] is a 60 y.o. PMH noted below, who presents to the [facility] with complaints of blood in urine. To see the management of his acute and chronic conditions, please refer to the A&P note under the encounters tab.   Past Medical History:  Diagnosis Date   Acute exacerbation of CHF (congestive heart failure) ([place]) 12/15/2019   Acute heart failure ([place]) 12/16/2019   Atrial fibrillation and flutter ([place]) 08/17/2015   A. S/p failed DCCV // b. Severe BAE on echo >> rate control strategy (has seen AF clinic) // c. Xarelto for anticoag (CHADS2-VASc=2 / CHF, HTN)   Chronic diastolic CHF (congestive heart failure) ([place]) 08/30/2015   A. Echo 6/17: Apical HK, moderate focal basal and mild concentric LVH, EF 50-55, diffuse HK, trivial MR, severe BAE, mild TR, PASP 37   Dependence on continuous supplemental oxygen    3L   Hepatitis C, chronic (HCC) 08/19/2015   History of cardiac catheterization    a. LHC 6/17: LAD irregs, o/w no CAD   Hypertension    Hyposmia 12/22/2017   OSA (obstructive sleep apnea) 11/21/2015   No Cpap   Pleural effusion on right 03/14/2018   Sleep apnea    Tobacco abuse    Review of Systems:  negative for dysuria, discharge, painful defecation, sexual activity, nausea, or vomiting. Positive for dyspepsia, flatulence, cramping  Physical Exam: Gen: Chronically ill appearing obese male  in NAD HEENT: normocephalic atraumatic, MMM, neck supple CV: RRR, no m/r/g, pitting edema up to the mid thigh Resp: diminished breath sounds 2/2 body habitus, normal WOB on 3 L [place] GI: soft, nontender, no CVA or suprapubic tenderness MSK: moves all extremities without difficulty, ambulating with a cane Skin:warm and dry Neuro:alert answering questions appropriately Psych: normal affect   Assessment & Plan:   See Encounters Tab for problem based charting.  Patient seen with Dr. Jimmye Norman

## 2021-01-11 LAB — URINALYSIS, ROUTINE W REFLEX MICROSCOPIC
Bilirubin, UA: NEGATIVE
Ketones, UA: NEGATIVE
Nitrite, UA: NEGATIVE
Specific Gravity, UA: 1.017 (ref 1.005–1.030)
Urobilinogen, Ur: 1 mg/dL (ref 0.2–1.0)
pH, UA: >=9 — AB (ref 5.0–7.5)

## 2021-01-11 LAB — MICROSCOPIC EXAMINATION: Casts: NONE SEEN /lpf

## 2021-01-16 DIAGNOSIS — H5213 Myopia, bilateral: Secondary | ICD-10-CM | POA: Diagnosis not present

## 2021-01-16 MED ORDER — AMOXICILLIN-POT CLAVULANATE 875-125 MG PO TABS: 1.0000 | ORAL_TABLET | Freq: Two times a day (BID) | ORAL | 0 refills | Status: AC

## 2021-01-16 NOTE — Addendum Note (Signed)
Addended by: Inda Coke on: 01/16/2021 05:03 PM   Modules accepted: Orders

## 2021-01-17 ENCOUNTER — Telehealth: Payer: Self-pay

## 2021-01-17 LAB — URINE CULTURE

## 2021-01-17 NOTE — Telephone Encounter (Signed)
Requesting test results, please call pt back.  

## 2021-01-17 NOTE — Telephone Encounter (Signed)
Sorry, patient is a red team patient.  See urine culture SChaplin, RN,BSN

## 2021-01-17 NOTE — Telephone Encounter (Signed)
Patient saw Dr. Ileene Musa on 10/26, blue team patient. Will forward blue team. Please see urine culture. SChaplin, RN,BSN

## 2021-01-29 ENCOUNTER — Ambulatory Visit: Payer: Self-pay

## 2021-01-31 NOTE — Progress Notes (Signed)
Internal Medicine Clinic Attending  I saw and evaluated the patient.  I personally confirmed the key portions of the history and exam documented by Dr. DeMaio   and I reviewed pertinent patient test results.  The assessment, diagnosis, and plan were formulated together and I agree with the documentation in the resident's note.  

## 2021-02-06 DIAGNOSIS — I5032 Chronic diastolic (congestive) heart failure: Secondary | ICD-10-CM | POA: Diagnosis not present

## 2021-02-06 DIAGNOSIS — I509 Heart failure, unspecified: Secondary | ICD-10-CM | POA: Diagnosis not present

## 2021-02-06 NOTE — Progress Notes (Signed)
Subjective:  CC: follow-up on hematuria  HPI:  Frank Morales is a 60 y.o. male with a past medical history stated below and presents today for follow-up on hematuria. Please see problem based assessment and plan for additional details.  Past Medical History:  Diagnosis Date   Acute exacerbation of CHF (congestive heart failure) ([place]) 12/15/2019   Acute heart failure ([place]) 12/16/2019   Atrial fibrillation and flutter ([place]) 08/17/2015   A. S/p failed DCCV // b. Severe BAE on echo >> rate control strategy (has seen AF clinic) // c. Xarelto for anticoag (CHADS2-VASc=2 / CHF, HTN)   Chronic diastolic CHF (congestive heart failure) ([place]) 08/30/2015   A. Echo 6/17: Apical HK, moderate focal basal and mild concentric LVH, EF 50-55, diffuse HK, trivial MR, severe BAE, mild TR, PASP 37   Dependence on continuous supplemental oxygen    3L   Hepatitis C, chronic (HCC) 08/19/2015   History of cardiac catheterization    a. LHC 6/17: LAD irregs, o/w no CAD   Hypertension    Hyposmia 12/22/2017   OSA (obstructive sleep apnea) 11/21/2015   No Cpap   Pleural effusion on right 03/14/2018   Sleep apnea    Tobacco abuse     Current Outpatient Medications on File Prior to Visit  Medication Sig Dispense Refill   albuterol (VENTOLIN HFA) 108 (90 Base) MCG/ACT inhaler Inhale 2 puffs into the lungs every 6 (six) hours as needed for wheezing or shortness of breath. 8 g 2   azelastine (ASTELIN) 0.1 % nasal spray INSTILL 2 SPRAYS INTO BOTH NOSTRILS TWICE DAILY 90 mL 1   dapagliflozin propanediol (FARXIGA) 10 MG TABS tablet TAKE 1 TABLET(10 MG) BY MOUTH DAILY BEFORE BREAKFAST 90 tablet 3   diltiazem (CARDIZEM CD) 240 MG 24 hr capsule Take 2 capsules (480 mg total) by mouth daily. 180 capsule 3   fluticasone (FLONASE) 50 MCG/ACT nasal spray Place 1 spray into both nostrils daily. 16 g 2   furosemide (LASIX) 80 MG tablet Take 1 tablet (80 mg total) by mouth 2 (two) times daily. 180 tablet 3   loratadine  (CLARITIN) 10 MG tablet TAKE 1 TABLET(10 MG) BY MOUTH DAILY 90 tablet 3   pantoprazole (PROTONIX) 20 MG tablet Take 1 tablet (20 mg total) by mouth daily. 30 tablet 6   rivaroxaban (XARELTO) 20 MG TABS tablet TAKE 1 TABLET(20 MG) BY MOUTH DAILY WITH SUPPER 90 tablet 2   simethicone (GAS-X) 80 MG chewable tablet Chew 1 tablet (80 mg total) by mouth 4 (four) times daily as needed for flatulence. 100 tablet 2   No current facility-administered medications on file prior to visit.    Family History  Problem Relation Age of Onset   Hypertension Mother    Diabetes Mother    Pneumonia Father    Hypertension Maternal Grandfather    Colon cancer Neg Hx     Social History   Socioeconomic History   Marital status: Single    Spouse name: Not on file   Number of children: Not on file   Years of education: Not on file   Highest education level: Not on file  Occupational History   Occupation: unemployed    Comment: Previous cook   Tobacco Use   Smoking status: Every Day    Packs/day: 0.50    Types: Cigarettes   Smokeless tobacco: Never   Tobacco comments:    5 per day   Vaping Use   Vaping Use: Never used  Substance and Sexual Activity   Alcohol use: Yes    Alcohol/week: 5.0 standard drinks    Types: 5 Standard drinks or equivalent per week    Comment: 2 times a week beer   Drug use: No   Sexual activity: Not on file  Other Topics Concern   Not on file  Social History Narrative   Not on file   Social Determinants of Health   Financial Resource Strain: Not on file  Food Insecurity: Not on file  Transportation Needs: Not on file  Physical Activity: Not on file  Stress: Not on file  Social Connections: Not on file  Intimate Partner Violence: Not on file    Review of Systems: ROS negative except for what is noted on the assessment and plan.  Objective:   Vitals:   02/07/21 1043  BP: 114/68  Pulse: (!) 107  SpO2: 98%  Weight: (!) 311 lb 4.8 oz (141.2 kg)  Height: 5'  11" (1.803 m)    Physical Exam: Gen: A&O x3 and in no apparent distress, well appearing and nourished. HEENT:    Head - normocephalic, atraumatic.    Eye - visual acuity grossly intact, conjunctiva clear, sclera non-icteric, EOM intact.    Mouth - No obvious caries or periodontal disease. Neck: no masses or nodules, AROM intact. CV: RRR, no murmurs, S1/S2 presents  Resp: Clear to auscultation bilaterally, normal pulmonary effort Abd: BS (+) x4, soft, non-tender abdomen, without hepatosplenomegaly or masses MSK: Grossly normal AROM and strength x4 extremities. Skin: good skin turgor, no rashes, unusual bruising, or prominent lesions.  Neuro: No focal deficits, grossly normal sensation and coordination.  Psych: Oriented x3 and responding appropriately. Intact memory, normal mood, judgement, affect, and insight.    Assessment & Plan:  See Encounters Tab for problem based charting.  Patient seen with Dr. Jay Schlichter Manjit Bufano, D.O. [facility]  PGY-1 Pager: [phone]  Phone: [phone] Date 02/07/2021  Time 1:01 PM

## 2021-02-07 ENCOUNTER — Ambulatory Visit (INDEPENDENT_AMBULATORY_CARE_PROVIDER_SITE_OTHER): Payer: Medicaid Other | Admitting: Internal Medicine

## 2021-02-07 ENCOUNTER — Other Ambulatory Visit: Payer: Self-pay

## 2021-02-07 ENCOUNTER — Encounter: Payer: Self-pay | Admitting: Internal Medicine

## 2021-02-07 VITALS — BP 114/68 | HR 107 | Ht 71.0 in | Wt 311.3 lb

## 2021-02-07 DIAGNOSIS — F17200 Nicotine dependence, unspecified, uncomplicated: Secondary | ICD-10-CM

## 2021-02-07 DIAGNOSIS — R319 Hematuria, unspecified: Secondary | ICD-10-CM

## 2021-02-07 DIAGNOSIS — J449 Chronic obstructive pulmonary disease, unspecified: Secondary | ICD-10-CM | POA: Insufficient documentation

## 2021-02-07 DIAGNOSIS — I5032 Chronic diastolic (congestive) heart failure: Secondary | ICD-10-CM

## 2021-02-07 LAB — POCT URINALYSIS DIPSTICK
Bilirubin, UA: NEGATIVE
Glucose, UA: POSITIVE — AB
Ketones, UA: NEGATIVE
Nitrite, UA: NEGATIVE
Protein, UA: NEGATIVE
Spec Grav, UA: 1.015 (ref 1.010–1.025)
Urobilinogen, UA: 1 E.U./dL
pH, UA: 6.5 (ref 5.0–8.0)

## 2021-02-07 MED ORDER — POTASSIUM CHLORIDE CRYS ER 20 MEQ PO TBCR: EXTENDED_RELEASE_TABLET | ORAL | 1 refills | Status: DC

## 2021-02-07 MED ORDER — METOLAZONE 2.5 MG PO TABS: 2.5000 mg | ORAL_TABLET | ORAL | 1 refills | Status: DC

## 2021-02-07 NOTE — Assessment & Plan Note (Signed)
Patient presents with continued symptoms of pain blood.  This started at the end of October.  He was evaluated in clinic on November 4 and urine analysis consistent with hematuria.  Urine culture grew Proteus mirabilis with positive leukocytes, white blood cell, and negative for nitrites.  Patient was treated with 7 days of Augmentin.  He states that he thinks that the color of urine improved for a few days, but his urine is currently dark again. He denies dysuria, fever, constipation, flank pain and change in urinary frequency.  Plan: -referral to urology for further evaluation in patient with asymptomatic hematuria and history of smoking increasing risk of bladder cancer.

## 2021-02-07 NOTE — Patient Instructions (Addendum)
Frank Morales, it was a pleasure seeing you today!  Today we discussed: Blood in urine You still had blood in your urine today.  I have referred you to urology for further work-up of why this could be happening.  I would like yo follow-up with you in 6 weeks for this.  COPD I have ordered a test called Pulmonary Function Test.  This test evaluates your lungs and will help [place] understand if you have any structural changes in your lungs that cause you to need oxygen.  Heart Failure I refilled metolazone and potassium.  I have ordered the following labs today:  Lab Orders         Culture, Urine         POCT Urinalysis Dipstick (76160)      Referrals ordered today:   Referral Orders         Ambulatory referral to Urology      I have ordered the following medication/changed the following medications:   Stop the following medications: Medications Discontinued During This Encounter  Medication Reason   metolazone (ZAROXOLYN) 2.5 MG tablet Reorder   potassium chloride SA (KLOR-CON) 20 MEQ tablet Reorder     Start the following medications: Meds ordered this encounter  Medications   metolazone (ZAROXOLYN) 2.5 MG tablet    Sig: Take 1 tablet (2.5 mg total) by mouth 3 (three) times a week. Monday, Wednesday, Friday    Dispense:  36 tablet    Refill:  1    **Patient requests 90 days supply**   potassium chloride SA (KLOR-CON) 20 MEQ tablet    Sig: Take 2 tablets by mouth twice daily    Dispense:  120 tablet    Refill:  1     Follow-up:  6 weeks for hematuria    Please make sure to arrive 15 minutes prior to your next appointment. If you arrive late, you may be asked to reschedule.   We look forward to seeing you next time. Please call our clinic at [phone] if you have any questions or concerns. The best time to call is Monday-Friday from 9am-4pm, but there is someone available 24/7. If after hours or the weekend, call the main hospital number and ask for the Internal  Medicine Resident On-Call. If you need medication refills, please notify your pharmacy one week in advance and they will send [place] a request.  Thank you for letting us take part in your care. Wishing you the best!  Thank you, Dr. [facility]

## 2021-02-07 NOTE — Progress Notes (Signed)
Internal Medicine Clinic Attending  I saw and evaluated the patient.  I personally confirmed the key portions of the history and exam documented by Dr. Masters and I reviewed pertinent patient test results.  The assessment, diagnosis, and plan were formulated together and I agree with the documentation in the resident's note.  

## 2021-02-07 NOTE — Assessment & Plan Note (Signed)
Assessment: Patient states that he has been adherent with his medications recently.  His weight in August was at 326 pounds and today's weight is at 311 pounds.  Medications: K 40 meq BID, Farxiga 10 mg, metalazone 2.5 three times weekly, Diltiazem 480 mg qd, Furosemide 80 mg BID  Plan: - Continue medications - Refilled Metalazone 2.5 mg taken MWF -Refilled Potassium 40 meq BID -Follow-up with cardiology

## 2021-02-07 NOTE — Assessment & Plan Note (Signed)
Continues to smoke but he states that he only smokes about a pack each week.  He is continuing to try to decrease the amount that he is smoking, but finds it difficult during social settings especially around the holidays.  He is not currently using anything to try to help him quit.

## 2021-02-07 NOTE — Assessment & Plan Note (Addendum)
Patient presents with COPD.  He has never had a pulmonary function test.  He currently uses 3.5 L of oxygen at home and states that this is for his chronic heart failure.  Patient states that he short with breath withwalking short distances and he has a cough that is occasionally productive.  He has been told that he has COPD in the past.  Not having to use albuterol inhaler in several weeks.     Physical exam reveals lungs clear to auscultation bilaterally, normal pulmonary effort. Oxygen saturation at 98% on 3.5 L of supplemental oxygen.  Plan: -PFT   ADDENDUM 02/22/21: PFT completed consistent with COPD.  Sent in [place]. Asked patient to use Spiriva once daily and continue using albuterol as needed.

## 2021-02-13 ENCOUNTER — Telehealth: Payer: Self-pay | Admitting: *Deleted

## 2021-02-13 NOTE — Telephone Encounter (Signed)
Call to patient.  Message left on patient's phone with appointment for PFT's.  PFT's scheduled for   02/21/2021 at 9 AM at [facility]  Patient to go to Admitting be 8:45 AM.  Patient will need Covid Screening Test on 02/19/2021 between 8 AM 3 PM  .  [address].  Location behind [employer].  Go to back of [address] to [facility] located in the back.  There is a Tent there and someone will come out to Patient to do test.  Message was left for patient to call the Clinics.  Sander Nephew, RN 02/13/2021 8:45 AM.

## 2021-02-15 LAB — URINE CULTURE

## 2021-02-15 NOTE — Telephone Encounter (Signed)
Refills on both meds sent 11/23. Call placed to [place] at [employer]. Insurance will not pay for refill on Potassium till 12/7 as patient  p/u 120 tabs on 11/17. Patient p/u 84 day supply of metolazone on 11/23. This should last him till Feb 2023. Call placed to patient. Reviewed med directions from 11/23 OV with patient :  Metalazone 2.5 mg taken MWF Potassium 40 meq BID  He now understands he has enough medication. Nothing further needed.

## 2021-02-15 NOTE — Telephone Encounter (Signed)
RTC from patient regarding appointment for PFT's .  Details for the appointment was given along with getting Covid test instructions and location.  Frank Morales mentioned that he was unable to get his Potassium and Zaroxolyn from pharmacy. Patient stated was taking Potassium every other day and fluid pills 3 times daily and needed to get his Potassium.  Is currently almost out of Potassium.  Call to patien's pharmacy Potassium will be paid for by his insurance on 02/21/2021 .  If patient gets before will cost him $87.  Message sent to Triage to call patient about his Potassium needs and dosing.  Patient has a new # [id number]. Sander Nephew, RN 02/15/2021 4:00 PM

## 2021-02-19 ENCOUNTER — Other Ambulatory Visit: Payer: Self-pay | Admitting: Student

## 2021-02-19 DIAGNOSIS — H524 Presbyopia: Secondary | ICD-10-CM | POA: Diagnosis not present

## 2021-02-19 DIAGNOSIS — H52223 Regular astigmatism, bilateral: Secondary | ICD-10-CM | POA: Diagnosis not present

## 2021-02-20 LAB — SARS CORONAVIRUS 2 (TAT 6-24 HRS): SARS Coronavirus 2: NEGATIVE

## 2021-02-21 ENCOUNTER — Ambulatory Visit (HOSPITAL_COMMUNITY)
Admission: RE | Admit: 2021-02-21 | Discharge: 2021-02-21 | Disposition: A | Discharge status: Home / Self Care | Payer: Medicaid Other | Source: Ambulatory Visit | Attending: Internal Medicine | Admitting: Internal Medicine

## 2021-02-21 DIAGNOSIS — J449 Chronic obstructive pulmonary disease, unspecified: Secondary | ICD-10-CM

## 2021-02-21 LAB — PULMONARY FUNCTION TEST
DL/VA % pred: 100 %
DL/VA: 4.23 ml/min/mmHg/L
DLCO unc % pred: 67 %
DLCO unc: 19.24 ml/min/mmHg
FEF 25-75 Post: 0.95 L/sec
FEF 25-75 Pre: 0.72 L/sec
FEF2575-%Change-Post: 32 %
FEF2575-%Pred-Post: 31 %
FEF2575-%Pred-Pre: 23 %
FEV1-%Change-Post: 19 %
FEV1-%Pred-Post: 54 %
FEV1-%Pred-Pre: 45 %
FEV1-Post: 1.79 L
FEV1-Pre: 1.5 L
FEV1FVC-%Change-Post: 25 %
FEV1FVC-%Pred-Pre: 69 %
FEV6-%Change-Post: 0 %
FEV6-%Pred-Post: 64 %
FEV6-%Pred-Pre: 64 %
FEV6-Post: 2.59 L
FEV6-Pre: 2.62 L
FEV6FVC-%Change-Post: 4 %
FEV6FVC-%Pred-Post: 101 %
FEV6FVC-%Pred-Pre: 97 %
FVC-%Change-Post: -4 %
FVC-%Pred-Post: 62 %
FVC-%Pred-Pre: 66 %
FVC-Post: 2.64 L
FVC-Pre: 2.77 L
Post FEV1/FVC ratio: 68 %
Post FEV6/FVC ratio: 98 %
Pre FEV1/FVC ratio: 54 %
Pre FEV6/FVC Ratio: 94 %
RV % pred: 146 %
RV: 3.33 L
TLC % pred: 85 %
TLC: 6.17 L

## 2021-02-21 MED ORDER — ALBUTEROL SULFATE (2.5 MG/3ML) 0.083% IN NEBU
2.5000 mg | INHALATION_SOLUTION | Freq: Once | RESPIRATORY_TRACT | Status: AC
Start: 2021-02-21 — End: 2021-02-21
  Administered 2021-02-21: 2.5 mg via RESPIRATORY_TRACT

## 2021-02-22 ENCOUNTER — Telehealth: Payer: Self-pay | Admitting: Internal Medicine

## 2021-02-22 DIAGNOSIS — J439 Emphysema, unspecified: Secondary | ICD-10-CM

## 2021-02-22 DIAGNOSIS — J449 Chronic obstructive pulmonary disease, unspecified: Secondary | ICD-10-CM

## 2021-02-22 MED ORDER — SPIRIVA HANDIHALER 18 MCG IN CAPS: 18.0000 ug | ORAL_CAPSULE | Freq: Every day | RESPIRATORY_TRACT | 2 refills | Status: DC

## 2021-02-22 NOTE — Telephone Encounter (Signed)
Attempted to call and review results with patient. Was not able to reach patient at that time.

## 2021-02-22 NOTE — Telephone Encounter (Signed)
Called and spoke with patient. Reviewed results of PFT consistent with COPD.  He is still currently smoking about .5 ppd, but trying to decrease amount.  He uses oxygen at 3.5 L currently. Sent in Spiriva inhaler and reviewed with patient that he should use once daily.

## 2021-02-27 DIAGNOSIS — M5412 Radiculopathy, cervical region: Secondary | ICD-10-CM | POA: Diagnosis not present

## 2021-03-08 DIAGNOSIS — I509 Heart failure, unspecified: Secondary | ICD-10-CM | POA: Diagnosis not present

## 2021-03-08 DIAGNOSIS — I5032 Chronic diastolic (congestive) heart failure: Secondary | ICD-10-CM | POA: Diagnosis not present

## 2021-03-13 ENCOUNTER — Other Ambulatory Visit: Payer: Self-pay

## 2021-03-13 ENCOUNTER — Ambulatory Visit (INDEPENDENT_AMBULATORY_CARE_PROVIDER_SITE_OTHER): Payer: Medicaid Other | Admitting: Internal Medicine

## 2021-03-13 ENCOUNTER — Encounter: Payer: Self-pay | Admitting: Internal Medicine

## 2021-03-13 DIAGNOSIS — J449 Chronic obstructive pulmonary disease, unspecified: Secondary | ICD-10-CM | POA: Diagnosis not present

## 2021-03-13 NOTE — Patient Instructions (Signed)
To Mr. Coin,   It was a pleasure seeing you today! Today we discussed your COPD medication. I am glad that you are coming down on your home oxygen. Please continue to use your new inhaler (Spiriva) daily as your controller medication and your albuterol (Ventolin) as needed for increased shortness of breath and wheezing. Please follow up with your primary care physician in 3-4 weeks to see how you are tolerating your Spiriva.  Have a happy New Year! Maudie Mercury, MD

## 2021-03-13 NOTE — Assessment & Plan Note (Addendum)
Patient with recently PFT diagnosed COPD presents to the clinic today for follow-up.  He is currently on 3 L down from 3.5 L at home.  He states that starting Spiriva he is only had to increase his oxygen 3.5 L if he is traveling long distances. He is also noticed a decrease use of his albuterol treatments. He states that he is taking his Spiriva as indicated, and is not experiencing any side effects.  We further discussed the importance of smoking cessation in the setting of COPD.  He states that he is currently weaning down and is smoking approximately half a pack per week.  He has tried nicotine gum, patches in the past with little effect. He states that he is going to continue weaning down on his cigarette usage.  He denies any increase in cough, sputum production, or increase shortness of breath today.  On physical examination his lungs are clear to auscultation bilaterally.  Given that he has a decreased home O2 requirement, he is doing quite well on his current regimen. - Continue Spiriva 18 MCG daily - Continue albuterol as needed - Follow-up with PCP approximately 3 weeks

## 2021-03-13 NOTE — Progress Notes (Signed)
° °  CC: COPD  HPI:  Frank Morales is a 60 y.o. person, with a PMH noted below, who presents to the clinic COPD on home 3 L. To see the management of their acute and chronic conditions, please see the A&P note under the Encounters tab.   Past Medical History:  Diagnosis Date   Acute exacerbation of CHF (congestive heart failure) ([place]) 12/15/2019   Acute heart failure ([place]) 12/16/2019   Atrial fibrillation and flutter ([place]) 08/17/2015   A. S/p failed DCCV // b. Severe BAE on echo >> rate control strategy (has seen AF clinic) // c. Xarelto for anticoag (CHADS2-VASc=2 / CHF, HTN)   Chronic diastolic CHF (congestive heart failure) ([place]) 08/30/2015   A. Echo 6/17: Apical HK, moderate focal basal and mild concentric LVH, EF 50-55, diffuse HK, trivial MR, severe BAE, mild TR, PASP 37   Dependence on continuous supplemental oxygen    3L   Hepatitis C, chronic (HCC) 08/19/2015   History of cardiac catheterization    a. LHC 6/17: LAD irregs, o/w no CAD   Hypertension    Hyposmia 12/22/2017   OSA (obstructive sleep apnea) 11/21/2015   No Cpap   Pleural effusion on right 03/14/2018   Sleep apnea    Tobacco abuse    Review of Systems:   Review of Systems  Constitutional:  Negative for chills, fever and weight loss.  Respiratory:  Negative for cough, hemoptysis, sputum production, shortness of breath and wheezing.   Cardiovascular:  Negative for chest pain, palpitations and orthopnea.  Gastrointestinal:  Negative for abdominal pain, diarrhea, nausea and vomiting.    Physical Exam:  Vitals:   03/13/21 1111  BP: 108/69  Pulse: 81  Temp: 97.9 F (36.6 C)  TempSrc: Oral  SpO2: 100%  Weight: (!) 322 lb 14.4 oz (146.5 kg)  Height: 5\' 11"  (1.803 m)   Physical Exam Constitutional:      Appearance: He is obese.     Comments: Obese male, sitting comfortably in wheelchair, able to converse in complete sentences.   HENT:     Head: Normocephalic and atraumatic.  Cardiovascular:     Rate and  Rhythm: Normal rate and regular rhythm.     Pulses: Normal pulses.     Heart sounds: Normal heart sounds. No murmur heard.   No friction rub. No gallop.  Pulmonary:     Effort: Pulmonary effort is normal.     Breath sounds: Normal breath sounds. No stridor. No wheezing, rhonchi or rales.     Comments: Saturating 100% on 3 L of oxygen, nonlabored breathing Abdominal:     General: Bowel sounds are normal. There is no distension.     Tenderness: There is no abdominal tenderness.  Neurological:     Mental Status: He is alert and oriented to person, place, and time.  Psychiatric:        Mood and Affect: Mood normal.        Behavior: Behavior normal.     Assessment & Plan:   See Encounters Tab for problem based charting.  Patient discussed with Dr. Jimmye Norman

## 2021-03-13 NOTE — Progress Notes (Signed)
Internal Medicine Clinic Attending ? ?Case discussed with Dr. Winters  At the time of the visit.  We reviewed the resident?s history and exam and pertinent patient test results.  I agree with the assessment, diagnosis, and plan of care documented in the resident?s note.  ?

## 2021-03-27 ENCOUNTER — Encounter: Payer: Medicaid Other | Admitting: Internal Medicine

## 2021-04-03 ENCOUNTER — Encounter: Payer: Self-pay | Admitting: Internal Medicine

## 2021-04-04 ENCOUNTER — Other Ambulatory Visit: Payer: Self-pay

## 2021-04-04 ENCOUNTER — Ambulatory Visit (INDEPENDENT_AMBULATORY_CARE_PROVIDER_SITE_OTHER): Payer: Medicaid Other | Admitting: Internal Medicine

## 2021-04-04 ENCOUNTER — Encounter: Payer: Self-pay | Admitting: Internal Medicine

## 2021-04-04 VITALS — BP 110/62 | HR 111 | Temp 98.2°F | Wt 334.2 lb

## 2021-04-04 DIAGNOSIS — I10 Essential (primary) hypertension: Secondary | ICD-10-CM

## 2021-04-04 DIAGNOSIS — R7303 Prediabetes: Secondary | ICD-10-CM | POA: Diagnosis not present

## 2021-04-04 DIAGNOSIS — F17200 Nicotine dependence, unspecified, uncomplicated: Secondary | ICD-10-CM | POA: Diagnosis not present

## 2021-04-04 DIAGNOSIS — J449 Chronic obstructive pulmonary disease, unspecified: Secondary | ICD-10-CM | POA: Diagnosis present

## 2021-04-04 DIAGNOSIS — R319 Hematuria, unspecified: Secondary | ICD-10-CM | POA: Diagnosis not present

## 2021-04-04 LAB — POCT GLYCOSYLATED HEMOGLOBIN (HGB A1C): Hemoglobin A1C: 5.7 % — AB (ref 4.0–5.6)

## 2021-04-04 LAB — GLUCOSE, CAPILLARY: Glucose-Capillary: 107 mg/dL — ABNORMAL HIGH (ref 70–99)

## 2021-04-04 MED ORDER — UMECLIDINIUM-VILANTEROL 62.5-25 MCG/ACT IN AEPB
1.0000 | INHALATION_SPRAY | Freq: Every day | RESPIRATORY_TRACT | 2 refills | Status: DC
  Filled 2021-04-04: qty 60, 60d supply, fill #0

## 2021-04-04 MED ORDER — UMECLIDINIUM-VILANTEROL 62.5-25 MCG/ACT IN AEPB: 1.0000 | INHALATION_SPRAY | Freq: Every day | RESPIRATORY_TRACT | 2 refills | Status: DC

## 2021-04-04 NOTE — Progress Notes (Signed)
CC: Follow-up for COPD, hematuria, prediabetes  HPI:  [name] is a 61 y.o. male with a past medical history stated below and presents today for follow-up for COPD, hematuria, prediabetes. Please see problem based assessment and plan for additional details.  Past Medical History:  Diagnosis Date   Acute exacerbation of CHF (congestive heart failure) ([place]) 12/15/2019   Acute heart failure ([place]) 12/16/2019   Atrial fibrillation and flutter ([place]) 08/17/2015   A. S/p failed DCCV // b. Severe BAE on echo >> rate control strategy (has seen AF clinic) // c. Xarelto for anticoag (CHADS2-VASc=2 / CHF, HTN)   Chronic diastolic CHF (congestive heart failure) ([place]) 08/30/2015   A. Echo 6/17: Apical HK, moderate focal basal and mild concentric LVH, EF 50-55, diffuse HK, trivial MR, severe BAE, mild TR, PASP 37   Dependence on continuous supplemental oxygen    3L   Hepatitis C, chronic (HCC) 08/19/2015   History of cardiac catheterization    a. LHC 6/17: LAD irregs, o/w no CAD   Hypertension    Hyposmia 12/22/2017   OSA (obstructive sleep apnea) 11/21/2015   No Cpap   Pleural effusion on right 03/14/2018   Sleep apnea    Tobacco abuse     Current Outpatient Medications on File Prior to Visit  Medication Sig Dispense Refill   albuterol (VENTOLIN HFA) 108 (90 Base) MCG/ACT inhaler Inhale 2 puffs into the lungs every 6 (six) hours as needed for wheezing or shortness of breath. 8 g 2   azelastine (ASTELIN) 0.1 % nasal spray INSTILL 2 SPRAYS INTO BOTH NOSTRILS TWICE DAILY 90 mL 1   dapagliflozin propanediol (FARXIGA) 10 MG TABS tablet TAKE 1 TABLET(10 MG) BY MOUTH DAILY BEFORE BREAKFAST 90 tablet 3   diltiazem (CARDIZEM CD) 240 MG 24 hr capsule Take 2 capsules (480 mg total) by mouth daily. 180 capsule 3   fluticasone (FLONASE) 50 MCG/ACT nasal spray Place 1 spray into both nostrils daily. 16 g 2   furosemide (LASIX) 80 MG tablet Take 1 tablet (80 mg total) by mouth 2 (two) times daily. 180  tablet 3   loratadine (CLARITIN) 10 MG tablet TAKE 1 TABLET(10 MG) BY MOUTH DAILY 90 tablet 3   metolazone (ZAROXOLYN) 2.5 MG tablet Take 1 tablet (2.5 mg total) by mouth 3 (three) times a week. Monday, Wednesday, Friday 36 tablet 1   pantoprazole (PROTONIX) 20 MG tablet Take 1 tablet (20 mg total) by mouth daily. 30 tablet 6   potassium chloride SA (KLOR-CON) 20 MEQ tablet Take 2 tablets by mouth twice daily 120 tablet 1   rivaroxaban (XARELTO) 20 MG TABS tablet TAKE 1 TABLET(20 MG) BY MOUTH DAILY WITH SUPPER 90 tablet 2   simethicone (GAS-X) 80 MG chewable tablet Chew 1 tablet (80 mg total) by mouth 4 (four) times daily as needed for flatulence. 100 tablet 2   No current facility-administered medications on file prior to visit.    Family History  Problem Relation Age of Onset   Hypertension Mother    Diabetes Mother    Pneumonia Father    Hypertension Maternal Grandfather    Colon cancer Neg Hx     Social History: currently smokes a couple cigarettes per week, usually on the weekends with friends.  Reports recent weight gain over the holidays, ~15 pounds.  Review of Systems: ROS negative except for what is noted on the assessment and plan.  Vitals:   04/04/21 1023  BP: 110/62  Pulse: (!) 111  Temp: 98.2  F (36.8 C)  TempSrc: Oral  SpO2: 99%  Weight: (!) 334 lb 3.2 oz (151.6 kg)     Physical Exam: General: Well appearing obese African-American male, NAD HENT: normocephalic, atraumatic EYES: conjunctiva non-erythematous, no scleral icterus CV: regular rate, irregularly irregular rhythm, no murmurs, rubs, gallops.  1+ pitting edema bilateral lower extremities.  Wearing compression socks today. Pulmonary: normal work of breathing on 3 LNC, difficult to auscultate due to body habitus, intermittent wheezing, did not appreciate crackles today Abdominal: Large pannus, soft, non-tender to palpation, normal BS Skin: Warm and dry, no rashes or lesions Neurological: MS: awake,  alert and oriented x3, normal speech and fund of knowledge Motor: moves all extremities antigravity Psych: normal affect    Assessment & Plan:   See Encounters Tab for problem based charting.  Patient discussed with Dr. Jilda Panda, M.D. [facility], PGY-1 Pager: [phone] Date 04/04/2021 Time 11:21 AM

## 2021-04-04 NOTE — Patient Instructions (Addendum)
Thank you, Mr.[place] for allowing [place] to provide your care today. Today we discussed:  COPD: We will switch your inhaler from the Spiriva to Anoro Ellipta (combo med).  This new inhaler may do a better job of helping with her symptoms since there are 2 medications acting together.  Continue to use the lowest amount of oxygen that you need while at home and to be careful when lighting cigarettes and your oxygen as this can be very very dangerous.  I have sent a referral for you to start doing pulmonary rehab, which is an exercise program that can help with your breathing and overall quality of life.  If the co-pay is too much for the Anoro Ellipta inhaler, give our office a call and let us know that you want the prescription switched to the [facility] on [address] where we can provide cheaper co-pays.  Please remember to get your COVID booster at [facility].  I ordered a hemoglobin A1c checked today to see how you are doing with your blood sugars.  I have ordered the following labs for you:  Lab Orders         POC Hbg A1C      My Chart Access: [email or web address]  Please follow-up in 1 month to check in on how you are doing with your COPD, pulmonary rehab, new inhaler, see if you are able to be seen by urology.  Please make sure to arrive 15 minutes prior to your next appointment. If you arrive late, you may be asked to reschedule.    We look forward to seeing you next time. Please call our clinic at [phone] if you have any questions or concerns. The best time to call is Monday-Friday from 9am-4pm, but there is someone available 24/7. If after hours or the weekend, call the main hospital number and ask for the Internal Medicine Resident On-Call. If you need medication refills, please notify your pharmacy one week in advance and they will send [place] a request.   Thank you for letting us take part in your care. Wishing  you the best!  Wayland Denis, MD 04/04/2021, 11:24 AM IM Resident, PGY-1

## 2021-04-04 NOTE — Assessment & Plan Note (Signed)
BP today 110/62.  Currently on Farxiga 10 mg, Lasix 80 mg twice daily, metolazone 2.5 mg 3 times a week for chronic heart failure.  Also takes diltiazem 40 mg daily for persistent A. fib.  Not on any additional antihypertensives.  Patient endorses adherence to medications and is able to correctly report dosing of each.  Blood pressure is currently well controlled on current regimen.  Plan: -Continue current antihypertensive regimen

## 2021-04-04 NOTE — Assessment & Plan Note (Signed)
Patient presents for COPD follow-up.  Is on Spiriva once daily and albuterol as needed.  Patient reports adherence to Spiriva and using albuterol 2 times per week.  Has not had any COPD exacerbations requiring hospitalization.  Does have symptoms with activity such as walking to [facility] appointment today.  Has SOB on exertion with cough.  Denies chest pain on exertion.  Currently on 3L nasal cannula at home.  Reports usually on 3L though occasionally is able to go down to 2L when he is relaxing at home.  Discussed importance to use lowest amount of oxygen that he is comfortable on.  Patient continues to smoke a few cigarettes throughout the week.  Discussed dangers of lighting a cigarette in the presence of oxygen.  On assessment, patient is still having shortness of breath and cough with daily activity more consistent with Gold group B COPD.  Will switch LAMA to LAMA + LABA combination inhaler.  Patient may also benefit from formal pulmonary rehab exercise training.   If Anoro Ellipta is too expensive a co-pay at [facility] on [address], can switch to community [facility] center on [address] for potentially lower cost co-pay.  Plan: -Discontinue Spiriva (LAMA) -Start Anora Ellipta (LAMA plus LABA combo) -Ambulatory referral to pulmonary rehab -Follow-up in 1 month -Encouraged COVID vaccination at local pharmacy

## 2021-04-04 NOTE — Assessment & Plan Note (Signed)
Patient reports currently smoking a few cigarettes a week.  Discussed importance of quitting smoking.  Patient currently not interested in using an RT products, reports these have not worked in the past.  Also not interested in any medications that can help with quitting today.  Patient prefers to continue to wean down his cigarette use on his own.

## 2021-04-04 NOTE — Assessment & Plan Note (Addendum)
Patient was previously seen in [facility] for hematuria.  Reports he is still having hematuria with deep red urine 1 day and clear urine in the next.  Patient denies fever, flank pain, dysuria. Discussed differential includes nonobstructive kidney stone causing irritation in the setting of chronic anticoagulation for A. fib, versus bladder cancer given history of tobacco use.  Patient is waiting for packet from urology in the mail with appointment time and date.    Nursing staff was kind enough to check in with urology office, patient had missed appointment on the 13th.  New appointment made for February 9th. Address provided.  Plan: -Follow-up with patient about urology visit in 1 month

## 2021-04-04 NOTE — Assessment & Plan Note (Addendum)
Patient's BMI 46.6.  Current weight is 334 pounds.  He reports ~15 pound weight gain over the holidays.  Volume status (has hx congestive heart failure) has stayed stable.  Patient reports eating many sweet foods over the holidays, is on fluid restriction, is on diuretics.  Last hemoglobin A1c 6.1% in March 2022. Patient reports motivated to make lifestyle changes such as eating less baked goods, exercising more once the weather gets nice.  Will refer to pulmonary rehab for exercise which may help with both weight loss and COPD symptoms.  Plan: -Hemoglobin A1c checked today -Ambulatory referral to pulmonary rehab -Consider referral to [facility] nutrition for weight loss in the future  ADDENDUM: Hgb A1c improved to 5.7%

## 2021-04-05 NOTE — Progress Notes (Signed)
Internal Medicine Clinic Attending ? ?Case discussed with Dr. Zinoviev  At the time of the visit.  We reviewed the resident?s history and exam and pertinent patient test results.  I agree with the assessment, diagnosis, and plan of care documented in the resident?s note.  ?

## 2021-04-08 DIAGNOSIS — I5032 Chronic diastolic (congestive) heart failure: Secondary | ICD-10-CM | POA: Diagnosis not present

## 2021-04-08 DIAGNOSIS — I509 Heart failure, unspecified: Secondary | ICD-10-CM | POA: Diagnosis not present

## 2021-04-18 DIAGNOSIS — J3489 Other specified disorders of nose and nasal sinuses: Secondary | ICD-10-CM | POA: Diagnosis not present

## 2021-04-27 DIAGNOSIS — R8279 Other abnormal findings on microbiological examination of urine: Secondary | ICD-10-CM | POA: Diagnosis not present

## 2021-04-27 DIAGNOSIS — R310 Gross hematuria: Secondary | ICD-10-CM | POA: Diagnosis not present

## 2021-05-04 ENCOUNTER — Encounter: Payer: Medicaid Other | Admitting: Internal Medicine

## 2021-05-07 ENCOUNTER — Other Ambulatory Visit: Payer: Self-pay

## 2021-05-07 DIAGNOSIS — I5032 Chronic diastolic (congestive) heart failure: Secondary | ICD-10-CM

## 2021-05-07 MED ORDER — POTASSIUM CHLORIDE CRYS ER 20 MEQ PO TBCR: EXTENDED_RELEASE_TABLET | ORAL | 1 refills | Status: DC

## 2021-05-07 NOTE — Telephone Encounter (Signed)
potassium chloride SA (KLOR-CON) 20 MEQ tablet, refill request @ [facility] [phone] - [place], [address] AT [address].

## 2021-05-09 DIAGNOSIS — I5032 Chronic diastolic (congestive) heart failure: Secondary | ICD-10-CM | POA: Diagnosis not present

## 2021-05-09 DIAGNOSIS — I509 Heart failure, unspecified: Secondary | ICD-10-CM | POA: Diagnosis not present

## 2021-05-15 ENCOUNTER — Ambulatory Visit (INDEPENDENT_AMBULATORY_CARE_PROVIDER_SITE_OTHER): Payer: Medicaid Other | Admitting: Internal Medicine

## 2021-05-15 ENCOUNTER — Encounter: Payer: Self-pay | Admitting: Internal Medicine

## 2021-05-15 DIAGNOSIS — F17210 Nicotine dependence, cigarettes, uncomplicated: Secondary | ICD-10-CM | POA: Diagnosis not present

## 2021-05-15 DIAGNOSIS — J449 Chronic obstructive pulmonary disease, unspecified: Secondary | ICD-10-CM

## 2021-05-15 DIAGNOSIS — Z0000 Encounter for general adult medical examination without abnormal findings: Secondary | ICD-10-CM

## 2021-05-15 DIAGNOSIS — I110 Hypertensive heart disease with heart failure: Secondary | ICD-10-CM | POA: Diagnosis not present

## 2021-05-15 DIAGNOSIS — R0981 Nasal congestion: Secondary | ICD-10-CM | POA: Diagnosis not present

## 2021-05-15 DIAGNOSIS — R319 Hematuria, unspecified: Secondary | ICD-10-CM

## 2021-05-15 DIAGNOSIS — I5032 Chronic diastolic (congestive) heart failure: Secondary | ICD-10-CM | POA: Diagnosis not present

## 2021-05-15 DIAGNOSIS — F17200 Nicotine dependence, unspecified, uncomplicated: Secondary | ICD-10-CM

## 2021-05-15 DIAGNOSIS — I10 Essential (primary) hypertension: Secondary | ICD-10-CM

## 2021-05-15 NOTE — Assessment & Plan Note (Signed)
Patient has history of sinus congestion.  He reports personal history of allergies.  Has also had sinus dryness likely from home oxygen.  Recently saw ENT 2/01, whom he had seen previously for mucocele, for dry sinuses and ENT recommended saline irrigation and saline nasal spray.    Today patient is endorsing postnasal drip causing cough.  Discussed use of Flonase or oral daily antihistamine use such as Allegra, Zyrtec, or Claritin to dry up postnasal drip.  Counseled that this would worsen sinus dryness caused by home O2, and patient would need to balance pros and cons of managing dryness while also managing postnasal drip.

## 2021-05-15 NOTE — Assessment & Plan Note (Signed)
BP today 132/68.  Currently on medications for heart failure: Farxiga 10 mg, Lasix 80 mg twice daily, metolazone 2.5 mg 3 times a week.  Also on diltiazem 40 mg daily for A-fib.  Patient endorses adherence to medications.  Blood pressure currently well controlled on this regimen.  Plan: -Continue above regimen

## 2021-05-15 NOTE — Assessment & Plan Note (Signed)
Patient has had 14 pound weight gain in the last 2-3 months.  Reports has not had worsening lower extremity swelling and denies orthopnea.  Has shortness of breath on exertion which is baseline for him with his COPD.  Euvolemic on exam with trace lower extremity edema bilaterally and no rales on auscultation.  Endorses adherence to Farxiga 10 mg daily, furosemide 80 mg twice daily, metolazone 2.5 mg 3 times a week.  Patient suspects weight gain is likely secondary to increased food intake, reports eating more unhealthy foods at family gatherings and cookouts.  On assessment, patient's chronic diastolic CHF stable on current regimen.  Euvolemic on exam.    Has been over a year since patient has followed up with cardiology.  Last no showed an appointment April 2022.  Will provide patient with phone number for cardiology office to set up appointment.  Plan: -Continue Farxiga 10 mg daily -Continue furosemide 80 mg twice daily -Continue metolazone 2.5 mg 3 times weekly -Make appointment with cardiology for follow-up

## 2021-05-15 NOTE — Patient Instructions (Addendum)
Thank you, Mr.[place] for allowing [place] to provide your care today. Today we discussed blood in the urine and postnasal drip.  -I am glad to hear that you are able to get established with urology.  Please attend your appointment tomorrow and Friday with the urology team.  -For your postnasal drip, you can try Flonase nasal spray or use Zyrtec or Allegra or Claritin daily to help dry up the nasal secretions that are running down the back your throat.  You can pick up both Flonase or Zyrtec/Allegra/Claritin at your local pharmacy.  Since these dry out the nose and you already have some sinus dryness we would need to see which you prefer and what works for you to reduce the postnasal drip but also keep your sinuses moist and comfortable.  -Please make an appointment with your cardiologist, the phone number is: [phone]  -You are due to get a COVID booster and your second shingles shot which can be obtained at your local [facility] or other pharmacy.  My Chart Access: [email or web address]  Please follow-up in 5 months for routine health visit, please call our office if something arises and you need to be seen earlier.  We can always move up your appointment.  Please make sure to arrive 15 minutes prior to your next appointment. If you arrive late, you may be asked to reschedule.    We look forward to seeing you next time. Please call our clinic at [phone] if you have any questions or concerns. The best time to call is Monday-Friday from 9am-4pm, but there is someone available 24/7. If after hours or the weekend, call the main hospital number and ask for the Internal Medicine Resident On-Call. If you need medication refills, please notify your pharmacy one week in advance and they will send [place] a request.   Thank you for letting us take part in your care. Wishing you the best!  Wayland Denis, MD 05/15/2021, 9:37 AM IM Resident, PGY-1

## 2021-05-15 NOTE — Progress Notes (Signed)
CC: Routine health visit  HPI:  FrankFrank Morales is a 61 y.o. male with a past medical history stated below and presents today for routine health follow-up. Please see problem based assessment and plan for additional details.  Past Medical History:  Diagnosis Date   Acute exacerbation of CHF (congestive heart failure) ([place]) 12/15/2019   Acute heart failure ([place]) 12/16/2019   Atrial fibrillation and flutter ([place]) 08/17/2015   A. S/p failed DCCV // b. Severe BAE on echo >> rate control strategy (has seen AF clinic) // c. Xarelto for anticoag (CHADS2-VASc=2 / CHF, HTN)   Chronic diastolic CHF (congestive heart failure) ([place]) 08/30/2015   A. Echo 6/17: Apical HK, moderate focal basal and mild concentric LVH, EF 50-55, diffuse HK, trivial MR, severe BAE, mild TR, PASP 37   Dependence on continuous supplemental oxygen    3L   Hepatitis C, chronic (HCC) 08/19/2015   History of cardiac catheterization    a. LHC 6/17: LAD irregs, o/w no CAD   Hypertension    Hyposmia 12/22/2017   OSA (obstructive sleep apnea) 11/21/2015   No Cpap   Pleural effusion on right 03/14/2018   Sleep apnea    Tobacco abuse     Current Outpatient Medications on File Prior to Visit  Medication Sig Dispense Refill   albuterol (VENTOLIN HFA) 108 (90 Base) MCG/ACT inhaler Inhale 2 puffs into the lungs every 6 (six) hours as needed for wheezing or shortness of breath. 8 g 2   azelastine (ASTELIN) 0.1 % nasal spray INSTILL 2 SPRAYS INTO BOTH NOSTRILS TWICE DAILY 90 mL 1   dapagliflozin propanediol (FARXIGA) 10 MG TABS tablet TAKE 1 TABLET(10 MG) BY MOUTH DAILY BEFORE BREAKFAST 90 tablet 3   diltiazem (CARDIZEM CD) 240 MG 24 hr capsule Take 2 capsules (480 mg total) by mouth daily. 180 capsule 3   fluticasone (FLONASE) 50 MCG/ACT nasal spray Place 1 spray into both nostrils daily. 16 g 2   furosemide (LASIX) 80 MG tablet Take 1 tablet (80 mg total) by mouth 2 (two) times daily. 180 tablet 3   loratadine (CLARITIN) 10 MG  tablet TAKE 1 TABLET(10 MG) BY MOUTH DAILY 90 tablet 3   metolazone (ZAROXOLYN) 2.5 MG tablet Take 1 tablet (2.5 mg total) by mouth 3 (three) times a week. Monday, Wednesday, Friday 36 tablet 1   pantoprazole (PROTONIX) 20 MG tablet Take 1 tablet (20 mg total) by mouth daily. 30 tablet 6   potassium chloride SA (KLOR-CON M) 20 MEQ tablet Take 2 tablets by mouth twice daily 120 tablet 1   rivaroxaban (XARELTO) 20 MG TABS tablet TAKE 1 TABLET(20 MG) BY MOUTH DAILY WITH SUPPER 90 tablet 2   simethicone (GAS-X) 80 MG chewable tablet Chew 1 tablet (80 mg total) by mouth 4 (four) times daily as needed for flatulence. 100 tablet 2   umeclidinium-vilanterol (ANORO ELLIPTA) 62.5-25 MCG/ACT AEPB Inhale 1 puff into the lungs daily. 60 each 2   No current facility-administered medications on file prior to visit.    Family History  Problem Relation Age of Onset   Hypertension Mother    Diabetes Mother    Pneumonia Father    Hypertension Maternal Grandfather    Colon cancer Neg Hx     Review of Systems: ROS negative except for what is noted on the assessment and plan.  Vitals:   05/15/21 0858  BP: 132/68  Pulse: 78  Temp: 97.7 F (36.5 C)  TempSrc: Oral  SpO2: 99%  Weight: Marland Kitchen)  336 lb (152.4 kg)  Height: 5\' 11"  (1.803 m)   Physical Exam: General: Well appearing obese African-American male, NAD HENT: normocephalic, atraumatic, external ears and nose appear normal EYES: conjunctiva non-erythematous, no scleral icterus CV: regular rate, normal rhythm, no murmurs, rubs, gallops.  Trace lower extremity edema bilaterally. Pulmonary: normal work of breathing on 3L [place], lungs clear to auscultation, specifically no rales or wheezes Abdominal: Obese, non-distended, soft, non-tender to palpation, normal BS Skin: Warm and dry, no rashes or lesions Neurological: MS: awake, alert and oriented x3, normal speech and fund of knowledge Motor: moves all extremities antigravity Psych: normal  affect    Assessment & Plan:   See Encounters Tab for problem based charting.  Patient discussed with Dr.  Solon Morales, M.D. [facility], PGY-1 Pager: [phone] Date 05/15/2021 Time 9:37 AM

## 2021-05-15 NOTE — Assessment & Plan Note (Signed)
Discussed obtaining COVID booster and second shingles vaccine at local pharmacy.  Patient reports he is motivated to do so.

## 2021-05-15 NOTE — Assessment & Plan Note (Addendum)
Patient currently smoking 1 or 2 cigarettes a few times a week.  Has been attempting to discontinue cigarette use on his own without an RT or medications.  Discussed potential benefit of using nicotine gum for cravings when he is in a social situation and wants to smoke.  Discussed detrimental effects of cigarette use on sinus problems and COPD.   Patient has had about 42 years of cigarette use.  In his youth he would smoke less than 1/2 pack/day.  In his adulthood he has smoked far less than this, about 1/5 or one fourth of a pack a day.  Based on this he does not meet 20-pack-year history and therefore does not qualify for yearly lung cancer screening.  Encouraged patient to continue to attempt to discontinue cigarette use.

## 2021-05-15 NOTE — Assessment & Plan Note (Addendum)
Patient presents for follow-up of hematuria.  Was previously referred to urology.  Patient reports was able to see urology at the beginning of February.  Unfortunately these records are not available for our review, however patient reports urine samples were obtained at this OV and he was started on 1 week course of antibiotics.  Patient does not think antibiotic helped.  Patient has had intermittent gross hematuria prior to antibiotic course, recently has not had any gross hematuria since taking antibiotics.  Has denied and continues to deny dysuria, urinary frequency, urinary urge. Patient reports he has a follow-up appointment with urology tomorrow for additional lab testing.  Has another appointment on Friday with the urology MD to discuss potential need for a procedure.  We will continue to follow-up on urology's recommendations.  It may be beneficial to obtain records from urology visits.

## 2021-05-15 NOTE — Assessment & Plan Note (Signed)
Patient reports doing well on Anoro Ellipta which was started last OV. Endorses daily adherence. Reports mild improvement in breathing symptoms.  Still on 2 to 3 L oxygen at home.  Still having shortness of breath on exertion.  Has not started with pulmonary rehab yet.  Has not yet gotten his COVID booster.  On assessment, chronic COPD stable on 2-3 L [place] at home.  Continue to encourage smoking cessation, exercise, caution about lighting cigarettes near oxygen.  Plan: -Continue Anoro Ellipta -Continue albuterol as needed -Follow-up on pulmonary rehab training at next OV -Follow-up on COVID booster administration at next OV

## 2021-05-16 DIAGNOSIS — R310 Gross hematuria: Secondary | ICD-10-CM | POA: Diagnosis not present

## 2021-05-17 NOTE — Progress Notes (Signed)
Internal Medicine Clinic Attending ? ?Case discussed with Dr. Zinoviev  At the time of the visit.  We reviewed the resident?s history and exam and pertinent patient test results.  I agree with the assessment, diagnosis, and plan of care documented in the resident?s note.  ?

## 2021-05-28 DIAGNOSIS — R310 Gross hematuria: Secondary | ICD-10-CM | POA: Diagnosis not present

## 2021-06-06 DIAGNOSIS — I5032 Chronic diastolic (congestive) heart failure: Secondary | ICD-10-CM | POA: Diagnosis not present

## 2021-06-06 DIAGNOSIS — I509 Heart failure, unspecified: Secondary | ICD-10-CM | POA: Diagnosis not present

## 2021-07-04 ENCOUNTER — Other Ambulatory Visit: Payer: Self-pay

## 2021-07-04 DIAGNOSIS — I5032 Chronic diastolic (congestive) heart failure: Secondary | ICD-10-CM

## 2021-07-05 ENCOUNTER — Other Ambulatory Visit: Payer: Self-pay | Admitting: *Deleted

## 2021-07-05 DIAGNOSIS — I5032 Chronic diastolic (congestive) heart failure: Secondary | ICD-10-CM

## 2021-07-05 MED ORDER — POTASSIUM CHLORIDE CRYS ER 20 MEQ PO TBCR: EXTENDED_RELEASE_TABLET | ORAL | 1 refills | Status: DC

## 2021-07-05 MED ORDER — DAPAGLIFLOZIN PROPANEDIOL 10 MG PO TABS: ORAL_TABLET | ORAL | 3 refills | Status: DC

## 2021-07-07 DIAGNOSIS — I509 Heart failure, unspecified: Secondary | ICD-10-CM | POA: Diagnosis not present

## 2021-07-07 DIAGNOSIS — I5032 Chronic diastolic (congestive) heart failure: Secondary | ICD-10-CM | POA: Diagnosis not present

## 2021-07-16 ENCOUNTER — Other Ambulatory Visit: Payer: Self-pay | Admitting: Student

## 2021-07-16 DIAGNOSIS — R0981 Nasal congestion: Secondary | ICD-10-CM

## 2021-08-06 DIAGNOSIS — I5032 Chronic diastolic (congestive) heart failure: Secondary | ICD-10-CM | POA: Diagnosis not present

## 2021-08-06 DIAGNOSIS — I509 Heart failure, unspecified: Secondary | ICD-10-CM | POA: Diagnosis not present

## 2021-08-07 ENCOUNTER — Ambulatory Visit (INDEPENDENT_AMBULATORY_CARE_PROVIDER_SITE_OTHER): Payer: Medicaid Other | Admitting: Internal Medicine

## 2021-08-07 ENCOUNTER — Encounter: Payer: Self-pay | Admitting: Internal Medicine

## 2021-08-07 VITALS — BP 112/70 | HR 73 | Ht 71.0 in | Wt 307.8 lb

## 2021-08-07 DIAGNOSIS — I4821 Permanent atrial fibrillation: Secondary | ICD-10-CM

## 2021-08-07 DIAGNOSIS — I10 Essential (primary) hypertension: Secondary | ICD-10-CM

## 2021-08-07 DIAGNOSIS — Z79899 Other long term (current) drug therapy: Secondary | ICD-10-CM | POA: Diagnosis not present

## 2021-08-07 DIAGNOSIS — I5032 Chronic diastolic (congestive) heart failure: Secondary | ICD-10-CM | POA: Diagnosis not present

## 2021-08-07 DIAGNOSIS — I5043 Acute on chronic combined systolic (congestive) and diastolic (congestive) heart failure: Secondary | ICD-10-CM | POA: Diagnosis not present

## 2021-08-07 NOTE — Progress Notes (Signed)
Cardiology Office Note   Date:  08/07/2021   ID:  Areeb, Corron Oct 13, 1960, MRN [id number]  PCP:  Wayland Denis, MD  Cardiologist:   Dorris Carnes, MD   Pt presents for f/u of diastolic CHF    History of Present Illness: Frank Morales is a 61 y.o. male with a history of diastolic CHF, OSA, persistant atrial fibrillation/flutter, COPD (on chronic O2) with continued tobacco abuse, HTN and substance abuse  In June 2017 he was admitted for CHF and  atrial flutter.   He underwent  Cardiac cath in June 2021 which showed no significant CAD   Echo showed LVEF was 50 to 55%     He was placed on Xarelto and underwent DCCV in July 2017  Echo in April 2021 LVEF 55 to 60%  He has had several admits for CHF, last in September 2021   I last saw the pt in clinic in Dec 2021  Since seen he says he cut back on his metolazone    He did not take it regularly   Fluid built up    He resumed and fluid came down   Ankles better  Breathing better     He deniesCP           Current Meds  Medication Sig   albuterol (VENTOLIN HFA) 108 (90 Base) MCG/ACT inhaler Inhale 2 puffs into the lungs every 6 (six) hours as needed for wheezing or shortness of breath.   azelastine (ASTELIN) 0.1 % nasal spray USE 2 SPRAYS IN EACH NOSTRIL TWICE DAILY   dapagliflozin propanediol (FARXIGA) 10 MG TABS tablet TAKE 1 TABLET(10 MG) BY MOUTH DAILY BEFORE BREAKFAST   diltiazem (CARDIZEM CD) 240 MG 24 hr capsule Take 2 capsules (480 mg total) by mouth daily.   fluticasone (FLONASE) 50 MCG/ACT nasal spray Place 1 spray into both nostrils daily.   furosemide (LASIX) 80 MG tablet Take 1 tablet (80 mg total) by mouth 2 (two) times daily.   loratadine (CLARITIN) 10 MG tablet TAKE 1 TABLET(10 MG) BY MOUTH DAILY   metolazone (ZAROXOLYN) 2.5 MG tablet Take 1 tablet (2.5 mg total) by mouth 3 (three) times a week. Monday, Wednesday, Friday   pantoprazole (PROTONIX) 20 MG tablet Take 1 tablet (20 mg total) by mouth daily.    potassium chloride SA (KLOR-CON M) 20 MEQ tablet Take 2 tablets by mouth twice daily   rivaroxaban (XARELTO) 20 MG TABS tablet TAKE 1 TABLET(20 MG) BY MOUTH DAILY WITH SUPPER   [DISCONTINUED] cetirizine (ZYRTEC) 10 MG tablet Take 1 tablet by mouth daily in the afternoon.     Allergies:   Lactose intolerance (gi)   Past Medical History:  Diagnosis Date   Acute exacerbation of CHF (congestive heart failure) ([place]) 12/15/2019   Acute heart failure ([place]) 12/16/2019   Atrial fibrillation and flutter ([place]) 08/17/2015   A. S/p failed DCCV // b. Severe BAE on echo >> rate control strategy (has seen AF clinic) // c. Xarelto for anticoag (CHADS2-VASc=2 / CHF, HTN)   Chronic diastolic CHF (congestive heart failure) ([place]) 08/30/2015   A. Echo 6/17: Apical HK, moderate focal basal and mild concentric LVH, EF 50-55, diffuse HK, trivial MR, severe BAE, mild TR, PASP 37   Dependence on continuous supplemental oxygen    3L   Hepatitis C, chronic (HCC) 08/19/2015   History of cardiac catheterization    a. LHC 6/17: LAD irregs, o/w no CAD   Hypertension    Hyposmia 12/22/2017  OSA (obstructive sleep apnea) 11/21/2015   No Cpap   Pleural effusion on right 03/14/2018   Sleep apnea    Tobacco abuse     Past Surgical History:  Procedure Laterality Date   CARDIAC CATHETERIZATION N/A 08/21/2015   Procedure: Right/Left Heart Cath and Coronary Angiography;  Surgeon: Leonie Man, MD;  Location: [facility];  Service: Cardiovascular;  Laterality: N/A;   CARDIOVERSION N/A 09/22/2015   Procedure: CARDIOVERSION;  Surgeon: Josue Hector, MD;  Location: [facility] END[facility]COPY;  Service: Cardiovascular;  Laterality: N/A;   COLONOSCOPY N/A 07/19/2016   Procedure: COLONOSCOPY;  Surgeon: Doran Stabler, MD;  Location: [facility]OSCOPY;  Service: Gastroenterology;  Laterality: N/A;   ESOPHAGOGASTRODUODENOSCOPY N/A 07/19/2016   Procedure: ESOPHAGOGASTRODUODENOSCOPY (EGD);  Surgeon: Doran Stabler, MD;  Location: [facility];   Service: Gastroenterology;  Laterality: N/A;   IR THORACENTESIS ASP PLEURAL SPACE W/IMG GUIDE  07/09/2019   NO PAST SURGERIES     SINUS ENDO WITH FUSION N/A 10/02/2018   Procedure: SINUS ENDO WITH FUSION;  Surgeon: Melissa Montane, MD;  Location: [facility];  Service: ENT;  Laterality: N/A;     Social History:  The patient  reports that he has been smoking cigarettes. He has been smoking an average of .5 packs per day. He has never used smokeless tobacco. He reports current alcohol use of about 5.0 standard drinks per week. He reports that he does not use drugs.   Family History:  The patient's family history includes Diabetes in his mother; Hypertension in his maternal grandfather and mother; Pneumonia in his father.    ROS:  Please see the history of present illness. All other systems are reviewed and  Negative to the above problem except as noted.    PHYSICAL EXAM: VS:  BP 112/70   Pulse 73   Ht '5\' 11"'$  (1.803 m)   Wt (!) 307 lb 12.8 oz (139.6 kg)   SpO2 98% Comment: 3 Liters of oxygen  BMI 42.93 kg/m   GEN:  Morbidly obese 61 yo  in no acute distress  Examined in chair  HEENT: normal  Neck: no obvious JVD Cardiac: Irreg irreg  ; no murmurs; Tr  LE  edema (soft) Respiratory: Clear to auscultation bilaterally, normal work of breathing GI: soft, nontender  Obese MS: no deformity Moving all extremities   Skin: warm and dry, no rash  EKG:  EKG is ordered today.  Atrial fibrillation   73 bpm   RBBB   Lipid Panel    Component Value Date/Time   CHOL 151 12/16/2019 0807   TRIG 77 12/16/2019 0807   HDL 39 (L) 12/16/2019 0807   CHOLHDL 3.9 12/16/2019 0807   VLDL 15 12/16/2019 0807   LDLCALC 97 12/16/2019 0807      Wt Readings from Last 3 Encounters:  08/07/21 (!) 307 lb 12.8 oz (139.6 kg)  05/15/21 (!) 336 lb (152.4 kg)  04/04/21 (!) 334 lb 3.2 oz (151.6 kg)      ASSESSMENT AND PLAN:  1  Chronic diastolic CHF   VOlume status looks OK    I would keep on curren regien    Will  check labs     2  COPD   Remains on chronic 3L O2  Follow    3  Atrial fibrillation/ flutter  Remains on anticoagulation  Check [facility] and BMET   4  HTN  BP is well controlled.  Follow   5  Hematuria   Will  get lab   I did not get UA     ChecK  CBC, BMET, TSH, lipid, BNP     Plan for f/u in Feb 2022   Current medicines are reviewed at length with the patient today.  The patient does not have concerns regarding medicines.  Signed, Dorris Carnes, MD  08/07/2021 [facility] [address], [place], [place]  [address] Phone: [phone]; Fax: [phone]

## 2021-08-07 NOTE — Patient Instructions (Signed)
Medication Instructions:   *If you need a refill on your cardiac medications before your next appointment, please call your pharmacy*   Lab Work: CBC, BMET, PRO BNP, TSH, LIPID If you have labs (blood work) drawn today and your tests are completely normal, you will receive your results only by: [place] (if you have MyChart) OR A paper copy in the mail If you have any lab test that is abnormal or we need to change your treatment, we will call you to review the results.   Testing/Procedures:    Follow-Up: At [facility], you and your health needs are our priority.  As part of our continuing mission to provide you with exceptional heart care, we have created designated Provider Care Teams.  These Care Teams include your primary Cardiologist (physician) and Advanced Practice Providers (APPs -  Physician Assistants and Nurse Practitioners) who all work together to provide you with the care you need, when you need it.  We recommend signing up for the patient portal called "MyChart".  Sign up information is provided on this After Visit Summary.  MyChart is used to connect with patients for Virtual Visits (Telemedicine).  Patients are able to view lab/test results, encounter notes, upcoming appointments, etc.  Non-urgent messages can be sent to your provider as well.   To learn more about what you can do with MyChart, go to [email or web address].     Important Information About Sugar

## 2021-08-08 ENCOUNTER — Telehealth: Payer: Self-pay | Admitting: *Deleted

## 2021-08-08 ENCOUNTER — Telehealth: Payer: Self-pay | Admitting: Internal Medicine

## 2021-08-08 LAB — BASIC METABOLIC PANEL
BUN/Creatinine Ratio: 21 — ABNORMAL HIGH (ref 9–20)
BUN: 16 mg/dL (ref 6–24)
CO2: 35 mmol/L — ABNORMAL HIGH (ref 20–29)
Calcium: 9.4 mg/dL (ref 8.7–10.2)
Chloride: 82 mmol/L — ABNORMAL LOW (ref 96–106)
Creatinine, Ser: 0.78 mg/dL (ref 0.76–1.27)
Glucose: 128 mg/dL — ABNORMAL HIGH (ref 70–99)
Potassium: 2.8 mmol/L — CL (ref 3.5–5.2)
Sodium: 132 mmol/L — ABNORMAL LOW (ref 134–144)
eGFR: 103 mL/min/{1.73_m2} (ref >59)

## 2021-08-08 LAB — LIPID PANEL
Chol/HDL Ratio: 4.8 ratio (ref 0.0–5.0)
Cholesterol, Total: 176 mg/dL (ref 100–199)
HDL: 37 mg/dL — ABNORMAL LOW (ref >39)
LDL Chol Calc (NIH): 101 mg/dL — ABNORMAL HIGH (ref 0–99)
Triglycerides: 219 mg/dL — ABNORMAL HIGH (ref 0–149)
VLDL Cholesterol Cal: 38 mg/dL (ref 5–40)

## 2021-08-08 LAB — CBC
Hematocrit: 36.4 % — ABNORMAL LOW (ref 37.5–51.0)
Hemoglobin: 12.7 g/dL — ABNORMAL LOW (ref 13.0–17.7)
MCH: 29.7 pg (ref 26.6–33.0)
MCHC: 34.9 g/dL (ref 31.5–35.7)
MCV: 85 fL (ref 79–97)
Platelets: 219 10*3/uL (ref 150–450)
RBC: 4.27 x10E6/uL (ref 4.14–5.80)
RDW: 17 % — ABNORMAL HIGH (ref 11.6–15.4)
WBC: 6.6 10*3/uL (ref 3.4–10.8)

## 2021-08-08 LAB — PRO B NATRIURETIC PEPTIDE: NT-Pro BNP: 432 pg/mL — ABNORMAL HIGH (ref 0–210)

## 2021-08-08 LAB — TSH: TSH: 2.72 u[IU]/mL (ref 0.450–4.500)

## 2021-08-08 NOTE — Telephone Encounter (Signed)
See other phone note from today.

## 2021-08-08 NOTE — Telephone Encounter (Signed)
-----   Message from Fay Records, MD sent at 08/08/2021 10:12 AM EDT ----- I saw pt yesterday in clinic    He is back on metolazone 3x per week Is he taking potassium and how much   Please confirm  His K is low   2.8   Need to verify meds   CBC is OK Thyroid function is normal     Lipds are fair    Will address later after electrolytes addressed FLuid is up a little

## 2021-08-08 NOTE — Telephone Encounter (Signed)
Lab result comments attached to this open refill request during documentation.  Will start new phone note for lab result documentation

## 2021-08-08 NOTE — Telephone Encounter (Signed)
Returned a call from [employer]. Reports patient's potassium is 2.33mol/L. Called the patient twice without response, and unable to leave a voice message as his phone did not set up this function.

## 2021-08-08 NOTE — Telephone Encounter (Signed)
Fay Records, MD  08/08/2021 10:13 AM EDT     Accompanying to last note Pt will need follow up BMET by Friday  (stat)  Again, need to learn what he is taking

## 2021-08-08 NOTE — Telephone Encounter (Signed)
See other phone note. I placed call to patient at number provided.  Ph: [phone] Left message to call office

## 2021-08-08 NOTE — Telephone Encounter (Signed)
See other phone note from today. I placed call to patient at number provided   Ph: [phone] Left message to call office

## 2021-08-08 NOTE — Telephone Encounter (Signed)
Patient returning call for results. Said to call this new number for now since his other is not connected currently. Ph: [phone]

## 2021-08-14 NOTE — Telephone Encounter (Addendum)
Attempted to call the pt but unable to reach him on his cell... left a message on the number that he left for [place] to call.... will keep trying the pt.   [phone]

## 2021-08-15 ENCOUNTER — Telehealth: Payer: Self-pay

## 2021-08-15 DIAGNOSIS — I5043 Acute on chronic combined systolic (congestive) and diastolic (congestive) heart failure: Secondary | ICD-10-CM

## 2021-08-15 NOTE — Telephone Encounter (Signed)
-----   Message from Fay Records, MD sent at 08/08/2021 10:12 AM EDT ----- I saw pt yesterday in clinic    He is back on metolazone 3x per week Is he taking potassium and how much   Please confirm  His K is low   2.8   Need to verify meds   CBC is OK Thyroid function is normal     Lipds are fair    Will address later after electrolytes addressed FLuid is up a little

## 2021-08-15 NOTE — Telephone Encounter (Signed)
Reached out to patient who confirms that he is taking Potassium 71mq twice daily everyday. He states he takes these at the same time each day that he takes his Furosemide '80mg'$ . Pt takes Metolazone three times weekly, in addition to the Furosemide, and still only takes the 4 total tablets per day. Will send clarification to Dr RHarrington Challengerper request.

## 2021-08-15 NOTE — Telephone Encounter (Signed)
Pt low K addressed by triage nurse... see other phone encounter from 08/15/21... will close this encounter.

## 2021-08-16 MED ORDER — POTASSIUM CHLORIDE CRYS ER 20 MEQ PO TBCR: 40.0000 meq | EXTENDED_RELEASE_TABLET | Freq: Two times a day (BID) | ORAL | 3 refills | Status: DC

## 2021-08-16 NOTE — Addendum Note (Signed)
Addended by: Precious Gilding on: 08/16/2021 05:01 PM   Modules accepted: Orders

## 2021-08-16 NOTE — Telephone Encounter (Signed)
Called pt advised of MD recommendation:  "I would recomm 2 tabs of KCL in morning and 2 at night Keep on other meds      Get BMET and Mg in  7 to 10 days    may need to back off on one of fluid pills a bit". Pt reports he was not taking potassium on days takes metolazone because will run out before the end of the month.  Advised pt that I will send in a new script so that will not run out.  Pt is agreeable to recommendation will have f/u lab work on 08/23/21.  Will send to Dr. Harrington Challenger to see if additional changes need to be made as pt was ordered to take X2 potassium chloride 20 mEq PO BID previously and recent MD recommendation is the same.

## 2021-08-16 NOTE — Telephone Encounter (Signed)
I would recomm 2 tabs of KCL in morning and 2 at night Keep on other meds      Get BMET and Mg in  7 to 10 days    may need to back off on one of fluid pills a bit

## 2021-08-23 ENCOUNTER — Other Ambulatory Visit: Payer: Medicaid Other

## 2021-08-23 DIAGNOSIS — I5043 Acute on chronic combined systolic (congestive) and diastolic (congestive) heart failure: Secondary | ICD-10-CM

## 2021-08-23 LAB — BASIC METABOLIC PANEL
BUN/Creatinine Ratio: 15 (ref 9–20)
BUN: 10 mg/dL (ref 6–24)
CO2: 35 mmol/L — ABNORMAL HIGH (ref 20–29)
Calcium: 9.2 mg/dL (ref 8.7–10.2)
Chloride: 86 mmol/L — ABNORMAL LOW (ref 96–106)
Creatinine, Ser: 0.65 mg/dL — ABNORMAL LOW (ref 0.76–1.27)
Glucose: 141 mg/dL — ABNORMAL HIGH (ref 70–99)
Potassium: 3.1 mmol/L — ABNORMAL LOW (ref 3.5–5.2)
Sodium: 134 mmol/L (ref 134–144)
eGFR: 109 mL/min/{1.73_m2} (ref >59)

## 2021-08-23 LAB — MAGNESIUM: Magnesium: 2 mg/dL (ref 1.6–2.3)

## 2021-08-24 ENCOUNTER — Telehealth: Payer: Self-pay

## 2021-08-24 DIAGNOSIS — I5043 Acute on chronic combined systolic (congestive) and diastolic (congestive) heart failure: Secondary | ICD-10-CM

## 2021-08-24 DIAGNOSIS — I10 Essential (primary) hypertension: Secondary | ICD-10-CM

## 2021-08-24 DIAGNOSIS — Z79899 Other long term (current) drug therapy: Secondary | ICD-10-CM

## 2021-08-24 DIAGNOSIS — I5032 Chronic diastolic (congestive) heart failure: Secondary | ICD-10-CM

## 2021-08-24 DIAGNOSIS — E876 Hypokalemia: Secondary | ICD-10-CM

## 2021-08-24 NOTE — Telephone Encounter (Signed)
Pts K today 3.1.Marland KitchenMarland KitchenMarland Kitchen after talking with the DOD... pt to take KCL 40 meq tid for 3 days then recheck in a week.  Left message for the pt tp call back.   Will continue to try him prior to the office closing.

## 2021-08-30 NOTE — Telephone Encounter (Signed)
After calling the pt several times from my home this past weekend and leaving messages and twice no answer... I got in touch with the pt today and he verbalized understanding to take the added K for the next three days and will try to return next Wed 09/05/21 for repeat BMET... he promises to let me know if he has transportation issues and cannot make it.... I told him to come anytime that he can get here and we will take care of his labs even if not on Wed and he agrees.

## 2021-08-31 ENCOUNTER — Other Ambulatory Visit: Payer: Self-pay | Admitting: Internal Medicine

## 2021-08-31 NOTE — Telephone Encounter (Signed)
Refill Request   pantoprazole (PROTONIX) 20 MG tablet  [id number] - [place], [place] AT [address]

## 2021-09-04 ENCOUNTER — Other Ambulatory Visit: Payer: Medicaid Other

## 2021-09-04 DIAGNOSIS — I5032 Chronic diastolic (congestive) heart failure: Secondary | ICD-10-CM | POA: Diagnosis not present

## 2021-09-04 DIAGNOSIS — Z79899 Other long term (current) drug therapy: Secondary | ICD-10-CM | POA: Diagnosis not present

## 2021-09-04 DIAGNOSIS — I5043 Acute on chronic combined systolic (congestive) and diastolic (congestive) heart failure: Secondary | ICD-10-CM | POA: Diagnosis not present

## 2021-09-04 DIAGNOSIS — E876 Hypokalemia: Secondary | ICD-10-CM

## 2021-09-04 DIAGNOSIS — I10 Essential (primary) hypertension: Secondary | ICD-10-CM

## 2021-09-04 MED ORDER — PANTOPRAZOLE SODIUM 20 MG PO TBEC: 20.0000 mg | DELAYED_RELEASE_TABLET | Freq: Every day | ORAL | 3 refills | Status: DC

## 2021-09-05 ENCOUNTER — Telehealth: Payer: Self-pay | Admitting: Internal Medicine

## 2021-09-05 ENCOUNTER — Other Ambulatory Visit: Payer: Medicaid Other

## 2021-09-05 ENCOUNTER — Telehealth: Payer: Self-pay

## 2021-09-05 DIAGNOSIS — E876 Hypokalemia: Secondary | ICD-10-CM

## 2021-09-05 DIAGNOSIS — Z79899 Other long term (current) drug therapy: Secondary | ICD-10-CM

## 2021-09-05 DIAGNOSIS — I5032 Chronic diastolic (congestive) heart failure: Secondary | ICD-10-CM

## 2021-09-05 LAB — BASIC METABOLIC PANEL
BUN/Creatinine Ratio: 19 (ref 9–20)
BUN: 13 mg/dL (ref 6–24)
CO2: 38 mmol/L — ABNORMAL HIGH (ref 20–29)
Calcium: 9.3 mg/dL (ref 8.7–10.2)
Chloride: 79 mmol/L — ABNORMAL LOW (ref 96–106)
Creatinine, Ser: 0.7 mg/dL — ABNORMAL LOW (ref 0.76–1.27)
Glucose: 95 mg/dL (ref 70–99)
Potassium: 2.5 mmol/L — CL (ref 3.5–5.2)
Sodium: 129 mmol/L — ABNORMAL LOW (ref 134–144)
eGFR: 106 mL/min/{1.73_m2} (ref >59)

## 2021-09-05 NOTE — Telephone Encounter (Signed)
-----   Message from Fay Records, MD sent at 09/05/2021  9:02 AM EDT ----- Confirm that he is taking meds as directed (lasix, metalozone, potassium) Stop metolazone    keep on lasix and KCL Take 2 additional KCL today and again tomorrow   (40 KCL 3x daily for 2 days)  REpeat BMET on MONday with BNP and MG

## 2021-09-05 NOTE — Telephone Encounter (Signed)
Paged by [facility] that K was 2.5 on my recent check. Called Frank Morales and let a voicemail to NOT take metolazone or lasix this AM. Also asked him to again take 40 mEq Kcl first thing this AM. Advised that someone from Dr. Harrington Challenger' team would be in touch this AM with further instructions.

## 2021-09-06 DIAGNOSIS — I509 Heart failure, unspecified: Secondary | ICD-10-CM | POA: Diagnosis not present

## 2021-09-06 DIAGNOSIS — I5032 Chronic diastolic (congestive) heart failure: Secondary | ICD-10-CM | POA: Diagnosis not present

## 2021-09-06 NOTE — Telephone Encounter (Signed)
Triage has addressed with the pt.. will close this encounter.

## 2021-09-09 ENCOUNTER — Encounter: Payer: Self-pay | Admitting: *Deleted

## 2021-09-09 ENCOUNTER — Emergency Department (HOSPITAL_COMMUNITY)
Admission: EM | Admit: 2021-09-09 | Discharge: 2021-09-09 | Disposition: A | Discharge status: Home / Self Care | Payer: Medicaid Other | Attending: Emergency Medicine | Admitting: Emergency Medicine

## 2021-09-09 ENCOUNTER — Other Ambulatory Visit: Payer: Self-pay

## 2021-09-09 ENCOUNTER — Emergency Department (HOSPITAL_COMMUNITY): Payer: Medicaid Other

## 2021-09-09 ENCOUNTER — Encounter (HOSPITAL_COMMUNITY): Payer: Self-pay | Admitting: Emergency Medicine

## 2021-09-09 DIAGNOSIS — R58 Hemorrhage, not elsewhere classified: Secondary | ICD-10-CM | POA: Diagnosis not present

## 2021-09-09 DIAGNOSIS — R0602 Shortness of breath: Secondary | ICD-10-CM | POA: Diagnosis not present

## 2021-09-09 DIAGNOSIS — R630 Anorexia: Secondary | ICD-10-CM | POA: Diagnosis not present

## 2021-09-09 DIAGNOSIS — I4891 Unspecified atrial fibrillation: Secondary | ICD-10-CM | POA: Insufficient documentation

## 2021-09-09 DIAGNOSIS — D649 Anemia, unspecified: Secondary | ICD-10-CM | POA: Diagnosis not present

## 2021-09-09 DIAGNOSIS — Z7901 Long term (current) use of anticoagulants: Secondary | ICD-10-CM | POA: Insufficient documentation

## 2021-09-09 DIAGNOSIS — I509 Heart failure, unspecified: Secondary | ICD-10-CM | POA: Diagnosis not present

## 2021-09-09 DIAGNOSIS — I110 Hypertensive heart disease with heart failure: Secondary | ICD-10-CM | POA: Diagnosis not present

## 2021-09-09 DIAGNOSIS — E876 Hypokalemia: Secondary | ICD-10-CM | POA: Insufficient documentation

## 2021-09-09 DIAGNOSIS — K625 Hemorrhage of anus and rectum: Secondary | ICD-10-CM | POA: Diagnosis not present

## 2021-09-09 DIAGNOSIS — R197 Diarrhea, unspecified: Secondary | ICD-10-CM | POA: Diagnosis not present

## 2021-09-09 LAB — CBC
HCT: 34.8 % — ABNORMAL LOW (ref 39.0–52.0)
Hemoglobin: 11.7 g/dL — ABNORMAL LOW (ref 13.0–17.0)
MCH: 29.2 pg (ref 26.0–34.0)
MCHC: 33.6 g/dL (ref 30.0–36.0)
MCV: 86.8 fL (ref 80.0–100.0)
Platelets: 173 10*3/uL (ref 150–400)
RBC: 4.01 MIL/uL — ABNORMAL LOW (ref 4.22–5.81)
RDW: 17.8 % — ABNORMAL HIGH (ref 11.5–15.5)
WBC: 8.2 10*3/uL (ref 4.0–10.5)
nRBC: 0 % (ref 0.0–0.2)

## 2021-09-09 LAB — COMPREHENSIVE METABOLIC PANEL
ALT: 38 U/L (ref 0–44)
AST: 68 U/L — ABNORMAL HIGH (ref 15–41)
Albumin: 3.2 g/dL — ABNORMAL LOW (ref 3.5–5.0)
Alkaline Phosphatase: 40 U/L (ref 38–126)
Anion gap: 10 (ref 5–15)
BUN: 15 mg/dL (ref 6–20)
CO2: 34 mmol/L — ABNORMAL HIGH (ref 22–32)
Calcium: 8.7 mg/dL — ABNORMAL LOW (ref 8.9–10.3)
Chloride: 89 mmol/L — ABNORMAL LOW (ref 98–111)
Creatinine, Ser: 0.79 mg/dL (ref 0.61–1.24)
GFR, Estimated: >60 mL/min (ref >60)
Glucose, Bld: 134 mg/dL — ABNORMAL HIGH (ref 70–99)
Potassium: 3.2 mmol/L — ABNORMAL LOW (ref 3.5–5.1)
Sodium: 133 mmol/L — ABNORMAL LOW (ref 135–145)
Total Bilirubin: 0.9 mg/dL (ref 0.3–1.2)
Total Protein: 7.7 g/dL (ref 6.5–8.1)

## 2021-09-09 LAB — BRAIN NATRIURETIC PEPTIDE: B Natriuretic Peptide: 171.8 pg/mL — ABNORMAL HIGH (ref 0.0–100.0)

## 2021-09-09 LAB — TYPE AND SCREEN
ABO/RH(D): O POS
Antibody Screen: NEGATIVE

## 2021-09-09 LAB — POC OCCULT BLOOD, ED: Fecal Occult Bld: NEGATIVE

## 2021-09-09 LAB — TROPONIN I (HIGH SENSITIVITY): Troponin I (High Sensitivity): 16 ng/L (ref <18)

## 2021-09-09 MED ORDER — POTASSIUM CHLORIDE CRYS ER 20 MEQ PO TBCR
40.0000 meq | EXTENDED_RELEASE_TABLET | Freq: Once | ORAL | Status: AC
  Administered 2021-09-09: 40 meq via ORAL
  Filled 2021-09-09: qty 2

## 2021-09-09 NOTE — ED Triage Notes (Signed)
Per EMS, patient from home, c/o one episode of bloody diarrhea at 0500 today. No history of same. Reports decreased appetite for the last few days. Denies abdominal pain, N/V. He does take xarelto for afib and is on 3L [place] at baseline for same.  BP 116/64 HR 96 RR 20 96% 3L

## 2021-09-09 NOTE — ED Provider Notes (Signed)
[facility]ENC[facility]PT Provider Note   CSN: [id number] Arrival date & time: 09/09/21  1416     History  Chief Complaint  Patient presents with   Rectal Bleeding    Frank Morales is a 61 y.o. male.   Rectal Bleeding   61 year old male presenting to the emergency department with a single episode of bloody diarrhea this morning.  He denies any history of the same.  He states that he has had some decreased appetite for the past few days but denies any abdominal pain, nausea and vomiting.  He is tolerating oral intake.  He has a history of atrial fibrillation and is on Xarelto.  He is at his baseline 3 L O2 via nasal cannula.  He denies any chest pain or shortness of breath.  He has had no further episodes of hematochezia.  Home Medications Prior to Admission medications   Medication Sig Start Date End Date Taking? Authorizing Provider  albuterol (VENTOLIN HFA) 108 (90 Base) MCG/ACT inhaler Inhale 2 puffs into the lungs every 6 (six) hours as needed for wheezing or shortness of breath. 05/23/20  Yes Seawell, Jaimie A, DO  azelastine (ASTELIN) 0.1 % nasal spray USE 2 SPRAYS IN EACH NOSTRIL TWICE DAILY 07/17/21  Yes Ellison Carwin, MD  dapagliflozin propanediol (FARXIGA) 10 MG TABS tablet TAKE 1 TABLET(10 MG) BY MOUTH DAILY BEFORE BREAKFAST 07/05/21  Yes Ellison Carwin, MD  diltiazem (CARDIZEM CD) 240 MG 24 hr capsule Take 2 capsules (480 mg total) by mouth daily. 10/25/20  Yes Katsadouros, Vasilios, MD  furosemide (LASIX) 80 MG tablet Take 1 tablet (80 mg total) by mouth 2 (two) times daily. 10/25/20  Yes Katsadouros, Vasilios, MD  loratadine (CLARITIN) 10 MG tablet TAKE 1 TABLET(10 MG) BY MOUTH DAILY 05/23/20  Yes Seawell, Jaimie A, DO  pantoprazole (PROTONIX) 20 MG tablet Take 1 tablet (20 mg total) by mouth daily. 09/04/21 08/30/22 Yes Ellison Carwin, MD  potassium chloride SA (KLOR-CON M) 20 MEQ tablet Take 2 tablets (40 mEq total) by mouth 2 (two) times daily. 08/16/21   Yes Pricilla Riffle, MD  rivaroxaban (XARELTO) 20 MG TABS tablet TAKE 1 TABLET(20 MG) BY MOUTH DAILY WITH SUPPER 10/25/20  Yes Katsadouros, Vasilios, MD  fluticasone (FLONASE) 50 MCG/ACT nasal spray Place 1 spray into both nostrils daily. Patient not taking: Reported on 09/09/2021 05/23/20   Guinevere Scarlet A, DO  simethicone (GAS-X) 80 MG chewable tablet Chew 1 tablet (80 mg total) by mouth 4 (four) times daily as needed for flatulence. Patient not taking: Reported on 08/07/2021 01/10/21 01/10/22  Ilene Qua, MD  umeclidinium-vilanterol (ANORO ELLIPTA) 62.5-25 MCG/ACT AEPB Inhale 1 puff into the lungs daily. Patient not taking: Reported on 08/07/2021 04/04/21   Ellison Carwin, MD      Allergies    Lactose intolerance (gi)    Review of Systems   Review of Systems  Gastrointestinal:  Positive for blood in stool, diarrhea and hematochezia.  All other systems reviewed and are negative.   Physical Exam Updated Vital Signs BP 117/72   Pulse 99   Temp 98.7 F (37.1 C) (Oral)   Resp (!) 23   Ht 5\' 11"  (1.803 m)   Wt (!) 140.6 kg   SpO2 98%   BMI 43.24 kg/m  Physical Exam Vitals and nursing note reviewed. Exam conducted with a chaperone present.  Constitutional:      General: He is not in acute distress.    Appearance: He is well-developed.  HENT:  Head: Normocephalic and atraumatic.  Eyes:     Conjunctiva/sclera: Conjunctivae normal.  Cardiovascular:     Rate and Rhythm: Normal rate. Rhythm irregular.  Pulmonary:     Effort: Pulmonary effort is normal. No respiratory distress.     Breath sounds: Normal breath sounds.  Abdominal:     Palpations: Abdomen is soft.     Tenderness: There is no abdominal tenderness.  Genitourinary:    Comments: No melena or hematochezia noted, fecal occult negative Musculoskeletal:        General: No swelling.     Cervical back: Neck supple.  Skin:    General: Skin is warm and dry.     Capillary Refill: Capillary refill takes less than 2  seconds.  Neurological:     Mental Status: He is alert.  Psychiatric:        Mood and Affect: Mood normal.     ED Results / Procedures / Treatments   Labs (all labs ordered are listed, but only abnormal results are displayed) Labs Reviewed  COMPREHENSIVE METABOLIC PANEL - Abnormal; Notable for the following components:      Result Value   Sodium 133 (*)    Potassium 3.2 (*)    Chloride 89 (*)    CO2 34 (*)    Glucose, Bld 134 (*)    Calcium 8.7 (*)    Albumin 3.2 (*)    AST 68 (*)    All other components within normal limits  CBC - Abnormal; Notable for the following components:   RBC 4.01 (*)    Hemoglobin 11.7 (*)    HCT 34.8 (*)    RDW 17.8 (*)    All other components within normal limits  BRAIN NATRIURETIC PEPTIDE - Abnormal; Notable for the following components:   B Natriuretic Peptide 171.8 (*)    All other components within normal limits  POC OCCULT BLOOD, ED  TYPE AND SCREEN  TROPONIN I (HIGH SENSITIVITY)    EKG None  Radiology DG Chest Portable 1 View  Result Date: 09/09/2021 CLINICAL DATA:  Shortness of breath, CHF EXAM: PORTABLE CHEST 1 VIEW COMPARISON:  12/15/2019 FINDINGS: Cardiomegaly. Both lungs are clear. The visualized skeletal structures are unremarkable. IMPRESSION: Cardiomegaly without acute abnormality of the lungs in AP portable projection. Electronically Signed   By: Jearld Lesch M.D.   On: 09/09/2021 17:01    Procedures Procedures    Medications Ordered in ED Medications  potassium chloride SA (KLOR-CON M) CR tablet 40 mEq (40 mEq Oral Given 09/09/21 1752)    ED Course/ Medical Decision Making/ A&P Clinical Course as of 09/11/21 1228  Sun Sep 09, 2021  1544 Potassium(!): 3.2 [JL]    Clinical Course User Index [JL] Ernie Avena, MD                           Medical Decision Making Amount and/or Complexity of Data Reviewed Labs: ordered. Decision-making details documented in ED Course. Radiology: ordered.  Risk Prescription  drug management.   61 year old male presenting to the emergency department with a single episode of bloody diarrhea this morning.  He denies any history of the same.  He states that he has had some decreased appetite for the past few days but denies any abdominal pain, nausea and vomiting.  He is tolerating oral intake.  He has a history of atrial fibrillation and is on Xarelto.  He is at his baseline 3 L O2 via nasal cannula.  He denies any chest pain or shortness of breath.  He has had no further episodes of hematochezia.  On arrival, the patient was vitally stable.  He was found to be in atrial fibrillation without RVR on cardiac telemetry.Marland Kitchen  Physical exam significant for a rectal exam with no melena or hematochezia noted.  Lungs were clear to auscultation bilaterally.  The patient's abdomen was soft, nontender, nondistended.  The patient was fecal occult negative.  Screening work-up significant for mild hypokalemia to 3.2 for which the patient was replenished orally with potassium.  His CBC was without a leukocytosis, anemia noted to 11.7, close to the patient's baseline of 12.  Cardiac enzymes were normal and the BNP was nonspecifically elevated to 172.  A chest x-ray was performed and revealed cardiomegaly without acute abnormality in the lungs.  The patient denies any chest pain or shortness of breath.  He remains at his baseline O2 via nasal cannula.  He has no signs of an active GI bleed at this time.  His abdomen is soft, nontender, nondistended and his rectal exam is reassuring.  No indication for hospitalization or further intervention at this time.  Return precautions were provided.   Final Clinical Impression(s) / ED Diagnoses Final diagnoses:  Rectal bleeding  Atrial fibrillation, unspecified type [facility])    Rx / DC Orders ED Discharge Orders     None         Ernie Avena, MD 09/11/21 1228

## 2021-09-10 ENCOUNTER — Other Ambulatory Visit: Payer: Medicaid Other

## 2021-09-26 ENCOUNTER — Telehealth: Payer: Self-pay | Admitting: Student

## 2021-09-26 NOTE — Telephone Encounter (Signed)
Pt states his pharmacy told him he will need to call his PCP because they can not fill this medication.  Please call the patient back  potassium chloride SA (KLOR-CON M) 20 MEQ tablet   Pharmacy  [facility], [address]

## 2021-09-26 NOTE — Telephone Encounter (Signed)
Rx for potassium was sent by [facility] on 08/16/21. Call placed to patient to contact their office. No answer and no VM set up.

## 2021-09-27 MED ORDER — POTASSIUM CHLORIDE CRYS ER 20 MEQ PO TBCR
40.0000 meq | EXTENDED_RELEASE_TABLET | Freq: Two times a day (BID) | ORAL | 1 refills | Status: DC
Start: 2021-09-27 — End: 2021-10-26

## 2021-09-27 NOTE — Telephone Encounter (Signed)
Requesting refill on potassium chloride SA (KLOR-CON M) 20 MEQ tablet, pt did contact the pharmacy. The pharmacy requesting the office to send in new Rx for this medication. Please call pt back.

## 2021-09-27 NOTE — Telephone Encounter (Signed)
RTC to patient .   No answer.  Unable to leave a message to return call.

## 2021-10-06 DIAGNOSIS — I509 Heart failure, unspecified: Secondary | ICD-10-CM | POA: Diagnosis not present

## 2021-10-06 DIAGNOSIS — I5032 Chronic diastolic (congestive) heart failure: Secondary | ICD-10-CM | POA: Diagnosis not present

## 2021-10-12 ENCOUNTER — Encounter: Payer: Medicaid Other | Admitting: Student

## 2021-10-22 ENCOUNTER — Other Ambulatory Visit: Payer: Self-pay | Admitting: Student

## 2021-11-07 ENCOUNTER — Other Ambulatory Visit: Payer: Self-pay | Admitting: Student

## 2021-11-07 DIAGNOSIS — I4891 Unspecified atrial fibrillation: Secondary | ICD-10-CM

## 2021-11-07 DIAGNOSIS — I5033 Acute on chronic diastolic (congestive) heart failure: Secondary | ICD-10-CM

## 2021-11-13 ENCOUNTER — Other Ambulatory Visit: Payer: Self-pay | Admitting: Urology

## 2021-11-13 ENCOUNTER — Ambulatory Visit
Admission: RE | Admit: 2021-11-13 | Discharge: 2021-11-13 | Disposition: A | Discharge status: Home / Self Care | Payer: Medicaid Other | Source: Ambulatory Visit | Attending: Urology | Admitting: Urology

## 2021-11-13 ENCOUNTER — Encounter (HOSPITAL_COMMUNITY): Payer: Self-pay | Admitting: Emergency Medicine

## 2021-11-13 DIAGNOSIS — N200 Calculus of kidney: Secondary | ICD-10-CM

## 2021-11-21 ENCOUNTER — Other Ambulatory Visit: Payer: Self-pay | Admitting: Student

## 2021-11-22 ENCOUNTER — Other Ambulatory Visit: Payer: Self-pay

## 2021-11-22 MED ORDER — POTASSIUM CHLORIDE CRYS ER 20 MEQ PO TBCR: EXTENDED_RELEASE_TABLET | ORAL | 0 refills | Status: DC

## 2021-11-22 MED ORDER — DILTIAZEM HCL ER COATED BEADS 240 MG PO CP24: ORAL_CAPSULE | ORAL | 3 refills | Status: DC

## 2021-11-22 NOTE — Telephone Encounter (Signed)
Patient last seen 05/15/21 by Dr.Zinoviev patient was instructed to return in 5 months patient is scheduled for a return visit 12/04/21 with Dr.White

## 2021-12-04 ENCOUNTER — Other Ambulatory Visit: Payer: Self-pay

## 2021-12-04 ENCOUNTER — Ambulatory Visit (INDEPENDENT_AMBULATORY_CARE_PROVIDER_SITE_OTHER): Payer: Medicaid Other

## 2021-12-04 VITALS — BP 124/66 | HR 95 | Temp 97.9°F | Ht 71.0 in | Wt 314.5 lb

## 2021-12-04 DIAGNOSIS — R7303 Prediabetes: Secondary | ICD-10-CM

## 2021-12-04 DIAGNOSIS — J449 Chronic obstructive pulmonary disease, unspecified: Secondary | ICD-10-CM | POA: Diagnosis not present

## 2021-12-04 DIAGNOSIS — I5032 Chronic diastolic (congestive) heart failure: Secondary | ICD-10-CM | POA: Diagnosis not present

## 2021-12-04 DIAGNOSIS — I4821 Permanent atrial fibrillation: Secondary | ICD-10-CM

## 2021-12-04 DIAGNOSIS — R1013 Epigastric pain: Secondary | ICD-10-CM | POA: Diagnosis not present

## 2021-12-04 DIAGNOSIS — R0981 Nasal congestion: Secondary | ICD-10-CM

## 2021-12-04 DIAGNOSIS — F17210 Nicotine dependence, cigarettes, uncomplicated: Secondary | ICD-10-CM

## 2021-12-04 LAB — GLUCOSE, CAPILLARY: Glucose-Capillary: 96 mg/dL (ref 70–99)

## 2021-12-04 LAB — POCT GLYCOSYLATED HEMOGLOBIN (HGB A1C): Hemoglobin A1C: 5.6 % (ref 4.0–5.6)

## 2021-12-04 MED ORDER — PANTOPRAZOLE SODIUM 20 MG PO TBEC: 20.0000 mg | DELAYED_RELEASE_TABLET | Freq: Every day | ORAL | 3 refills | Status: DC

## 2021-12-04 MED ORDER — UMECLIDINIUM-VILANTEROL 62.5-25 MCG/ACT IN AEPB: 1.0000 | INHALATION_SPRAY | Freq: Every day | RESPIRATORY_TRACT | 2 refills | Status: DC

## 2021-12-04 MED ORDER — DAPAGLIFLOZIN PROPANEDIOL 10 MG PO TABS: ORAL_TABLET | ORAL | 3 refills | Status: DC

## 2021-12-04 NOTE — Assessment & Plan Note (Signed)
Patient rate controlled with diltiazem.  On Xarelto.  Denies symptoms including palpitations or chest pain.  Regular rate and regular rhythm on exam today.  -Continue on Xarelto and diltiazem

## 2021-12-04 NOTE — Assessment & Plan Note (Addendum)
Patient continues on 3 L of oxygen at home and denies any worsening of his SOB on exertion.  He has still not started pulmonary rehab or gotten his COVID booster.  He has reduced his tobacco to 1 pack a week.  He states he has not been taking his Anoro Ellipta due to an issue with his pharmacy.  He endorses his albuterol inhaler once or twice a week.  Lungs with mild wheezing and rhonchi in lower lung fields.  -Refill Anoro Ellipta -Albuterol as needed -Recommended COVID booster at pharmacy

## 2021-12-04 NOTE — Assessment & Plan Note (Addendum)
Patient presents today for follow-up of his diastolic heart failure.  He is clinically stable and continues on 3 L by nasal cannula at home.  He denies any worsening of his lower extremity edema or orthopnea.  He states he has to sleep on his side or on his stomach at night but this is not a recent change.  He continues to endorse shortness of breath on exertion but has concomitant COPD.  Lung exam with end expiratory wheezes and faint rhonchi from mid to base bilaterally.  Mild edema bilaterally consistent with baseline.  Endorses Farxiga 10 mg daily, furosemide 80 mg twice daily, metolazone 2.5 mg 3 times a week.  Patient's weight is down 20 pounds since last visit.  Endorses avoidance of salt.  Followed up with cardiology a few months ago.  -Continue medication regimen -Continue follow-up with cardiology -Repeat BMP  Addendum: Patient on potassium 68mq daily. Called patient and he will come in for BMP, BNP, Mg in 2 weeks.

## 2021-12-04 NOTE — Patient Instructions (Addendum)
Frank Morales, it was a pleasure seeing you today!  Today we discussed: Heart failure: Continue with your medications as prescribed. COPD: Start taking your Anora Ellipta again. Continue with your albuterol inhaler as needed. Allergies: Try taking Claritin once a day and start back your Flonase. We will see you back in 3 months and can discuss symptoms if they persist.  I have ordered the following labs today:  Lab Orders         BMP8+Anion Gap         POC Hbg A1C       Tests ordered today:  none  Referrals ordered today:   Referral Orders  No referral(s) requested today     I have ordered the following medication/changed the following medications:   Stop the following medications: Medications Discontinued During This Encounter  Medication Reason   umeclidinium-vilanterol (ANORO ELLIPTA) 62.5-25 MCG/ACT AEPB Reorder   dapagliflozin propanediol (FARXIGA) 10 MG TABS tablet Reorder   pantoprazole (PROTONIX) 20 MG tablet Reorder     Start the following medications: Meds ordered this encounter  Medications   dapagliflozin propanediol (FARXIGA) 10 MG TABS tablet    Sig: TAKE 1 TABLET(10 MG) BY MOUTH DAILY BEFORE BREAKFAST    Dispense:  90 tablet    Refill:  3   pantoprazole (PROTONIX) 20 MG tablet    Sig: Take 1 tablet (20 mg total) by mouth daily.    Dispense:  90 tablet    Refill:  3   umeclidinium-vilanterol (ANORO ELLIPTA) 62.5-25 MCG/ACT AEPB    Sig: Inhale 1 puff into the lungs daily.    Dispense:  60 each    Refill:  2     Follow-up: 3 months   Please make sure to arrive 15 minutes prior to your next appointment. If you arrive late, you may be asked to reschedule.   We look forward to seeing you next time. Please call our clinic at [phone] if you have any questions or concerns. The best time to call is Monday-Friday from 9am-4pm, but there is someone available 24/7. If after hours or the weekend, call the main hospital number and ask for the  Internal Medicine Resident On-Call. If you need medication refills, please notify your pharmacy one week in advance and they will send [place] a request.  Thank you for letting us take part in your care. Wishing you the best!  Thank you, Linward Natal, MD

## 2021-12-04 NOTE — Assessment & Plan Note (Signed)
Patient continues with congestion mainly in nasal passages.  Reports chronic history of allergies.  He denies any signs of frontal or maxillary sinusitis.  He had tried Flonase for short while.  Has already seen ENT who recommended saline irrigation and saline nasal spray.  -Continue azelastine -Continue Flonase -Start Claritin

## 2021-12-04 NOTE — Progress Notes (Addendum)
CC: COPD and CHF f/u  HPI:  Frank Morales is a 61 y.o. with past medical history as below who presents for follow up of his heart failure and COPD.  Past Medical History:  Diagnosis Date   Acute exacerbation of CHF (congestive heart failure) ([place]) 12/15/2019   Acute heart failure ([place]) 12/16/2019   Atrial fibrillation and flutter ([place]) 08/17/2015   A. S/p failed DCCV // b. Severe BAE on echo >> rate control strategy (has seen AF clinic) // c. Xarelto for anticoag (CHADS2-VASc=2 / CHF, HTN)   Chronic diastolic CHF (congestive heart failure) ([place]) 08/30/2015   A. Echo 6/17: Apical HK, moderate focal basal and mild concentric LVH, EF 50-55, diffuse HK, trivial MR, severe BAE, mild TR, PASP 37   Dependence on continuous supplemental oxygen    3L   Hepatitis C, chronic (HCC) 08/19/2015   History of cardiac catheterization    a. LHC 6/17: LAD irregs, o/w no CAD   Hypertension    Hyposmia 12/22/2017   OSA (obstructive sleep apnea) 11/21/2015   No Cpap   Pleural effusion on right 03/14/2018   Sleep apnea    Tobacco abuse    Review of Systems: See detailed assessment and plan for pertinent ROS.  Physical Exam:  Vitals:   12/04/21 1020  BP: 124/66  Pulse: 95  Temp: 97.9 F (36.6 C)  TempSrc: Oral  SpO2: 99%  Weight: (!) 314 lb 8 oz (142.7 kg)  Height: '5\' 11"'$  (1.803 m)   Physical Exam Constitutional:      General: He is not in acute distress.    Appearance: He is obese.     Comments: 3 L by nasal cannula  HENT:     Head: Normocephalic and atraumatic.  Eyes:     Extraocular Movements: Extraocular movements intact.  Cardiovascular:     Rate and Rhythm: Normal rate. Rhythm irregular.  Pulmonary:     Breath sounds: Wheezing and rhonchi present.  Musculoskeletal:     Left lower leg: Edema present.  Skin:    General: Skin is warm and dry.  Neurological:     Mental Status: He is alert and oriented to person, place, and time.  Psychiatric:        Mood and Affect: Mood  normal.      Assessment & Plan:   See Encounters Tab for problem based charting. Chronic diastolic CHF (congestive heart failure) [facility]) Patient presents today for follow-up of his diastolic heart failure.  He is clinically stable and continues on 3 L by nasal cannula at home.  He denies any worsening of his lower extremity edema or orthopnea.  He states he has to sleep on his side or on his stomach at night but this is not a recent change.  He continues to endorse shortness of breath on exertion but has concomitant COPD.  Lung exam with end expiratory wheezes and faint rhonchi from mid to base bilaterally.  Mild edema bilaterally consistent with baseline.  Endorses Farxiga 10 mg daily, furosemide 80 mg twice daily, metolazone 2.5 mg 3 times a week.  Patient's weight is down 20 pounds since last visit.  Endorses avoidance of salt.  Followed up with cardiology a few months ago.  -Continue medication regimen -Continue follow-up with cardiology -Repeat BMP  Addendum: Patient on potassium 10mq daily. Called patient and he will come in for BMP, BNP, Mg in 2 weeks.  Permanent atrial fibrillation Patient rate controlled with diltiazem.  On Xarelto.  Denies symptoms  including palpitations or chest pain.  Regular rate and regular rhythm on exam today.  -Continue on Xarelto and diltiazem  COPD (chronic obstructive pulmonary disease) ([place]) Patient continues on 3 L of oxygen at home and denies any worsening of his SOB on exertion.  He has still not started pulmonary rehab or gotten his COVID booster.  He has reduced his tobacco to 1 pack a week.  He states he has not been taking his Anoro Ellipta due to an issue with his pharmacy.  He endorses his albuterol inhaler once or twice a week.  Lungs with mild wheezing and rhonchi in lower lung fields.  -Refill Anoro Ellipta -Albuterol as needed -Recommended COVID booster at pharmacy  Nasal congestion Patient continues with congestion mainly in nasal  passages.  Reports chronic history of allergies.  He denies any signs of frontal or maxillary sinusitis.  He had tried Flonase for short while.  Has already seen ENT who recommended saline irrigation and saline nasal spray.  -Continue azelastine -Continue Flonase -Start Claritin   Patient seen with Dr. Philipp Ovens

## 2021-12-05 LAB — BMP8+ANION GAP
Anion Gap: 15 mmol/L (ref 10.0–18.0)
BUN/Creatinine Ratio: 17 (ref 10–24)
BUN: 19 mg/dL (ref 8–27)
CO2: 34 mmol/L — ABNORMAL HIGH (ref 20–29)
Calcium: 8.9 mg/dL (ref 8.6–10.2)
Chloride: 87 mmol/L — ABNORMAL LOW (ref 96–106)
Creatinine, Ser: 1.15 mg/dL (ref 0.76–1.27)
Glucose: 89 mg/dL (ref 70–99)
Potassium: 3.2 mmol/L — ABNORMAL LOW (ref 3.5–5.2)
Sodium: 136 mmol/L (ref 134–144)
eGFR: 73 mL/min/{1.73_m2} (ref >59)

## 2021-12-06 NOTE — Addendum Note (Signed)
Addended by: Linward Natal on: 12/06/2021 03:42 PM   Modules accepted: Orders

## 2021-12-06 NOTE — Progress Notes (Signed)
Internal Medicine Clinic Attending  I saw and evaluated the patient.  I personally confirmed the key portions of the history and exam documented by Dr. Dema Severin and I reviewed pertinent patient test results.  The assessment, diagnosis, and plan were formulated together and I agree with the documentation in the resident's note.   Potassium has been persistently low now for multiple checks. Recommend increasing daily potassium supplement to 40 mEq daily and recheck at follow up.

## 2021-12-07 DIAGNOSIS — I509 Heart failure, unspecified: Secondary | ICD-10-CM | POA: Diagnosis not present

## 2021-12-07 DIAGNOSIS — I5032 Chronic diastolic (congestive) heart failure: Secondary | ICD-10-CM | POA: Diagnosis not present

## 2021-12-20 ENCOUNTER — Other Ambulatory Visit: Payer: Self-pay | Admitting: Student

## 2021-12-20 ENCOUNTER — Other Ambulatory Visit: Payer: Medicaid Other

## 2021-12-20 DIAGNOSIS — I5032 Chronic diastolic (congestive) heart failure: Secondary | ICD-10-CM

## 2021-12-20 LAB — BRAIN NATRIURETIC PEPTIDE: B Natriuretic Peptide: 175 pg/mL — ABNORMAL HIGH (ref 0.0–100.0)

## 2021-12-21 LAB — BMP8+ANION GAP
Anion Gap: 15 mmol/L (ref 10.0–18.0)
BUN/Creatinine Ratio: 15 (ref 10–24)
BUN: 12 mg/dL (ref 8–27)
CO2: 32 mmol/L — ABNORMAL HIGH (ref 20–29)
Calcium: 9 mg/dL (ref 8.6–10.2)
Chloride: 92 mmol/L — ABNORMAL LOW (ref 96–106)
Creatinine, Ser: 0.82 mg/dL (ref 0.76–1.27)
Glucose: 109 mg/dL — ABNORMAL HIGH (ref 70–99)
Potassium: 3.5 mmol/L (ref 3.5–5.2)
Sodium: 139 mmol/L (ref 134–144)
eGFR: 101 mL/min/{1.73_m2} (ref >59)

## 2021-12-21 LAB — MAGNESIUM: Magnesium: 2.2 mg/dL (ref 1.6–2.3)

## 2022-01-06 DIAGNOSIS — I509 Heart failure, unspecified: Secondary | ICD-10-CM | POA: Diagnosis not present

## 2022-01-06 DIAGNOSIS — I5032 Chronic diastolic (congestive) heart failure: Secondary | ICD-10-CM | POA: Diagnosis not present

## 2022-01-15 ENCOUNTER — Other Ambulatory Visit: Payer: Self-pay | Admitting: *Deleted

## 2022-01-15 NOTE — Telephone Encounter (Signed)
Call from pt who stated the pharmacy only gave him #60 tabs which only last 15 days. Labs were done 12/20/21 - K+ was 3.5.

## 2022-01-17 MED ORDER — POTASSIUM CHLORIDE CRYS ER 20 MEQ PO TBCR
40.0000 meq | EXTENDED_RELEASE_TABLET | Freq: Two times a day (BID) | ORAL | 3 refills | Status: DC
Start: 2022-01-17 — End: 2022-07-26

## 2022-01-17 NOTE — Telephone Encounter (Signed)
Left message of K+ refill on pt's answering service.

## 2022-02-06 DIAGNOSIS — I5032 Chronic diastolic (congestive) heart failure: Secondary | ICD-10-CM | POA: Diagnosis not present

## 2022-02-06 DIAGNOSIS — I509 Heart failure, unspecified: Secondary | ICD-10-CM | POA: Diagnosis not present

## 2022-03-08 DIAGNOSIS — I509 Heart failure, unspecified: Secondary | ICD-10-CM | POA: Diagnosis not present

## 2022-03-08 DIAGNOSIS — I5032 Chronic diastolic (congestive) heart failure: Secondary | ICD-10-CM | POA: Diagnosis not present

## 2022-04-08 DIAGNOSIS — I5032 Chronic diastolic (congestive) heart failure: Secondary | ICD-10-CM | POA: Diagnosis not present

## 2022-04-08 DIAGNOSIS — I509 Heart failure, unspecified: Secondary | ICD-10-CM | POA: Diagnosis not present

## 2022-04-16 ENCOUNTER — Encounter: Payer: Self-pay | Admitting: Student

## 2022-04-16 ENCOUNTER — Other Ambulatory Visit: Payer: Self-pay

## 2022-04-16 ENCOUNTER — Ambulatory Visit (INDEPENDENT_AMBULATORY_CARE_PROVIDER_SITE_OTHER): Payer: Medicaid Other | Admitting: Student

## 2022-04-16 VITALS — BP 108/56 | HR 95 | Temp 98.4°F | Ht 71.0 in | Wt 327.8 lb

## 2022-04-16 DIAGNOSIS — R1013 Epigastric pain: Secondary | ICD-10-CM

## 2022-04-16 DIAGNOSIS — R0981 Nasal congestion: Secondary | ICD-10-CM

## 2022-04-16 MED ORDER — FLUTICASONE PROPIONATE 50 MCG/ACT NA SUSP: 1.0000 | Freq: Every day | NASAL | 2 refills | Status: DC

## 2022-04-16 MED ORDER — SIMETHICONE 80 MG PO CHEW: 80.0000 mg | CHEWABLE_TABLET | Freq: Four times a day (QID) | ORAL | 2 refills | Status: DC | PRN

## 2022-04-16 MED ORDER — LORATADINE 10 MG PO TABS: ORAL_TABLET | ORAL | 3 refills | Status: DC

## 2022-04-16 MED ORDER — AZELASTINE HCL 0.1 % NA SOLN: NASAL | 1 refills | Status: DC

## 2022-04-16 NOTE — Patient Instructions (Addendum)
Thank you, Mr.[place] for allowing [place] to provide your care today. Today we discussed your nasal congestion and occasional refill.  I have sent refills of your Flonase to your pharmacy. Continue using the saline rinses and humidifier at home.   I have ordered the following medication/changed the following medications:  Refilled Flonase 1 spray into both nostrils daily  My Chart Access: [email or web address]  Please follow-up in 3 months  Please make sure to arrive 15 minutes prior to your next appointment. If you arrive late, you may be asked to reschedule.    We look forward to seeing you next time. Please call our clinic at [phone] if you have any questions or concerns. The best time to call is Monday-Friday from 9am-4pm, but there is someone available 24/7. If after hours or the weekend, call the main hospital number and ask for the Internal Medicine Resident On-Call. If you need medication refills, please notify your pharmacy one week in advance and they will send [place] a request.   Thank you for letting us take part in your care. Wishing you the best!  Lacinda Axon, MD 04/16/2022, 11:52 AM IM Resident, PGY-3 [facility] 41:10

## 2022-04-16 NOTE — Assessment & Plan Note (Addendum)
Patient presents today for evaluation of nasal congestion.  Patient reports that he has been try saline nasal spray as well as saline irrigation but continues to have nasal congestion. States he brings up mucus occasionally but mostly feels congested in the nose. He denies any runny nose fever, chills, cough or nosebleeds.  States he thought that his Flonase was discontinued during his last office visit.  On exam, patient has dry and erythematous nasal mucosal but otherwise patent nares.  There is no sinus tenderness on exam.  Plan: -Refill Flonase 1 spray into each nostrils daily -Refill azelastine 0.1% nasal spray -Refill Claritin 10 mg daily -Advised continue saline nasal rinse and continue using humidifier -Follow-up in 3 months for routine follow-up

## 2022-04-16 NOTE — Progress Notes (Signed)
   CC: Nasal congestion, medication refill  HPI:  Frank Morales is a 62 y.o. male with PMH as below who presents to clinic for evaluation of nasal congestion for medication refills. Please see problem based charting for evaluation, assessment and plan.  Past Medical History:  Diagnosis Date   Acute exacerbation of CHF (congestive heart failure) ([place]) 12/15/2019   Acute heart failure ([place]) 12/16/2019   Atrial fibrillation and flutter ([place]) 08/17/2015   A. S/p failed DCCV // b. Severe BAE on echo >> rate control strategy (has seen AF clinic) // c. Xarelto for anticoag (CHADS2-VASc=2 / CHF, HTN)   Chronic diastolic CHF (congestive heart failure) ([place]) 08/30/2015   A. Echo 6/17: Apical HK, moderate focal basal and mild concentric LVH, EF 50-55, diffuse HK, trivial MR, severe BAE, mild TR, PASP 37   Dependence on continuous supplemental oxygen    3L   Hepatitis C, chronic (HCC) 08/19/2015   History of cardiac catheterization    a. LHC 6/17: LAD irregs, o/w no CAD   Hypertension    Hyposmia 12/22/2017   OSA (obstructive sleep apnea) 11/21/2015   No Cpap   Pleural effusion on right 03/14/2018   Sleep apnea    Tobacco abuse     Review of Systems:  Constitutional: Negative for fever, or chills HEENT: Positive for nasal congestion. Negative for runny nose Respiratory: Positive for chronic shortness of breath and dyspnea on exertion Cardiac: Negative for chest pain or palpitations Neuro: Negative for headache, dizziness or weakness  Physical Exam: General: Pleasant, morbidly obese man.  No acute distress. HEENT: Bilateral nares dry with erythema of the nasal mucosa but patent. No tenderness to palpation of the sinuses. Cardiac: Mild tachycardia. Irregularly irregular rhythm.  No murmurs, rubs or gallops. Trace BLE edema. Respiratory: On 3 L [place] [address]. Decreased air movement throughout. No wheezing or crackles. Abdominal: Abdominal obesity.  Nontender.  Normal BS. Skin: Warm, dry and intact  without rashes or lesions Extremities: Radial pulses 2+ and symmetric. Neuro: A&O x 3. Moves all extremities Psych: Appropriate mood and affect.  Vitals:   04/16/22 1115  BP: (!) 108/56  Pulse: 95  Temp: 98.4 F (36.9 C)  TempSrc: Oral  SpO2: 96%  Weight: (!) 327 lb 12.8 oz (148.7 kg)  Height: '5\' 11"'$  (1.803 m)    Assessment & Plan:   Nasal congestion Patient presents today for evaluation of nasal congestion.  Patient reports that he has been try saline nasal spray as well as saline irrigation but continues to have nasal congestion. States he brings up mucus occasionally but mostly feels congested in the nose. He denies any runny nose fever, chills, cough or nosebleeds.  States he thought that his Flonase was discontinued during his last office visit.  On exam, patient has dry and erythematous nasal mucosal but otherwise patent nares.  There is no sinus tenderness on exam.  Plan: -Refill Flonase 1 spray into each nostrils daily -Refill azelastine 0.1% nasal spray -Refill Claritin 10 mg daily -Advised continue saline nasal rinse and continue using humidifier -Follow-up in 3 months for routine follow-up   See Encounters Tab for problem based charting.  Patient discussed with Dr.  Dani Gobble, MD, MPH

## 2022-04-16 NOTE — Progress Notes (Signed)
Internal Medicine Clinic Attending  Case discussed with Dr. A[place]ons[place]  At the time of the visit.  We reviewed the resident's history and exam and pertinent patient test results.  I agree with the assessment, diagnosis, and plan of care documented in the resident's note.  

## 2022-05-06 ENCOUNTER — Other Ambulatory Visit: Payer: Self-pay

## 2022-05-06 DIAGNOSIS — J449 Chronic obstructive pulmonary disease, unspecified: Secondary | ICD-10-CM

## 2022-05-09 DIAGNOSIS — I5032 Chronic diastolic (congestive) heart failure: Secondary | ICD-10-CM | POA: Diagnosis not present

## 2022-05-09 DIAGNOSIS — I509 Heart failure, unspecified: Secondary | ICD-10-CM | POA: Diagnosis not present

## 2022-06-07 DIAGNOSIS — I509 Heart failure, unspecified: Secondary | ICD-10-CM | POA: Diagnosis not present

## 2022-06-07 DIAGNOSIS — I5032 Chronic diastolic (congestive) heart failure: Secondary | ICD-10-CM | POA: Diagnosis not present

## 2022-07-08 DIAGNOSIS — I509 Heart failure, unspecified: Secondary | ICD-10-CM | POA: Diagnosis not present

## 2022-07-08 DIAGNOSIS — I5032 Chronic diastolic (congestive) heart failure: Secondary | ICD-10-CM | POA: Diagnosis not present

## 2022-07-16 ENCOUNTER — Ambulatory Visit (INDEPENDENT_AMBULATORY_CARE_PROVIDER_SITE_OTHER): Payer: Medicaid Other | Admitting: Student

## 2022-07-16 ENCOUNTER — Encounter: Payer: Self-pay | Admitting: Student

## 2022-07-16 VITALS — BP 125/68 | HR 96 | Temp 98.2°F | Ht 71.0 in | Wt 333.2 lb

## 2022-07-16 DIAGNOSIS — J441 Chronic obstructive pulmonary disease with (acute) exacerbation: Secondary | ICD-10-CM

## 2022-07-16 DIAGNOSIS — F17210 Nicotine dependence, cigarettes, uncomplicated: Secondary | ICD-10-CM

## 2022-07-16 DIAGNOSIS — D649 Anemia, unspecified: Secondary | ICD-10-CM | POA: Diagnosis not present

## 2022-07-16 DIAGNOSIS — I4821 Permanent atrial fibrillation: Secondary | ICD-10-CM

## 2022-07-16 DIAGNOSIS — I5032 Chronic diastolic (congestive) heart failure: Secondary | ICD-10-CM

## 2022-07-16 DIAGNOSIS — F17200 Nicotine dependence, unspecified, uncomplicated: Secondary | ICD-10-CM

## 2022-07-16 DIAGNOSIS — J301 Allergic rhinitis due to pollen: Secondary | ICD-10-CM | POA: Diagnosis not present

## 2022-07-16 MED ORDER — PREDNISONE 20 MG PO TABS
40.0000 mg | ORAL_TABLET | Freq: Every day | ORAL | 0 refills | Status: DC
Start: 2022-07-16 — End: 2022-10-02

## 2022-07-16 MED ORDER — FLUTICASONE PROPIONATE HFA 110 MCG/ACT IN AERO
1.0000 | INHALATION_SPRAY | Freq: Every day | RESPIRATORY_TRACT | 12 refills | Status: DC
Start: 2022-07-16 — End: 2022-08-28

## 2022-07-16 MED ORDER — METOLAZONE 2.5 MG PO TABS: 2.5000 mg | ORAL_TABLET | ORAL | 0 refills | Status: DC

## 2022-07-16 NOTE — Patient Instructions (Addendum)
Frank Morales,  It was a pleasure seeing you in the clinic today.   For your breathing Continue using the anoro ellipta. I have prescribed an inhaler called Flovent to help with your breathing. Use this once daily. I have also prescribed oral steroid tablets for you. Please take 2 tablets with breakfast every morning for 5 days. I have placed a referral to allergy specialist. They will call you to schedule this appointment. For your heart Please continue using the lasix as you have been. I am checking a lab to check your kidney function and electrolytes. If this is normal, I will likely have you take an extra dose of your metolazone.  Please call Dr. Tenny Craw' Office (heart doctor) to schedule an appointment as soon as possible. [facility] [phone] We are checking some labs today. I will call you with the results.  Please call our clinic at [phone] if you have any questions or concerns. The best time to call is Monday-Friday from 9am-4pm, but there is someone available 24/7 at the same number. If you need medication refills, please notify your pharmacy one week in advance and they will send [place] a request.   Thank you for letting us take part in your care. We look forward to seeing you next time!

## 2022-07-16 NOTE — Progress Notes (Signed)
Attestation for Student Documentation:  I personally was present and performed or re-performed the history, physical exam and medical decision-making activities of this service and have verified that the service and findings are accurately documented in the student's note.  Frank Morales is a 62 year old male living with COPD, allergic rhinitis, chronic diastolic HF, permanent Afib, and tobacco use disorder. He is presenting today with subacute dyspnea, worsening cough and sputum production, and increased leg swelling. He does have diminished air entry bilaterally without wheezing along with bibasilar crackles and 2+ pitting edema in bilateral LE. History and exam consistent with COPD exacerbation, possibly with contribution from underlying allergic rhinitis, and HFpEF exacerbation.  COPD exacerbation - PFTs appear consistent with asthma given reversibility with bronchodilator therapy. He possibly has an asthma variant and/or asthma-COPD overlap. Continue anoro ellipta Add flovent daily Albuterol prn 5-day course of prednisone 40mg  daily Allergic rhinitis - likely contributing to above. F/u CBC with diff to assess for eosinophilia Continue flonase, azelastine, claritin, saline nasal rinse, humidifier Referral to allergy/immunology for further evaluation (skin testing, consideration of biologic therapy) HFpEF exacerbation - currently taking lasix 80mg  BID along with metolazone once weekly (states he decreased frequency on his own about 2-3 months ago). Continue farxiga Continue lasix 80mg  BID (may need to consider transitioning to torsemide for increased bioavailability - will await cardiology visit to decide next step) Metolazone 2.5mg  weekly (Wednesdays) - refilled today F/u BMP to evaluate kidney function and electrolytes If BMP stable, will advise patient to add extra dose of metolazone once a week (change to Wed and Sat dosing) until more euvolemic Provided contact information for cardiology  for follow up Will arrange for close f/u in 1 week to ensure he is diuresing well and improving from a respiratory standpoint.   Merrilyn Puma, MD 07/16/2022, 3:36 PM

## 2022-07-16 NOTE — Progress Notes (Signed)
Subjective:   Patient ID: Frank Morales male   DOB: December 20, 1960 62 y.o.   MRN: [id number]  HPI: [name] is a 62 y.o. male with a PMHx as listed below who presents for follow up. Please see problem based assessment and plan for further work up and management.   Patient Active Problem List   Diagnosis Date Noted   COPD (chronic obstructive pulmonary disease) (HCC) 02/07/2021   Hematuria 01/10/2021   Dyspepsia 01/10/2021   Allergic rhinitis 05/23/2020   Prediabetes 05/23/2020   Left cervical radiculopathy 10/31/2019   Healthcare maintenance 07/20/2019   Permanent atrial fibrillation    Chronic dental pain 08/19/2016   Grade I internal hemorrhoids    Hepatic fibrosis 02/29/2016   OSA (obstructive sleep apnea) 11/21/2015   Chronic diastolic CHF (congestive heart failure) (HCC) 08/30/2015   Hepatitis C, chronic (HCC) 08/19/2015   Morbid obesity (HCC) 08/17/2015   Hypertension 08/17/2015   Tobacco use disorder 08/17/2015     Current Outpatient Medications  Medication Sig Dispense Refill   fluticasone (FLOVENT HFA) 110 MCG/ACT inhaler Inhale 1 puff into the lungs daily. 1 each 12   predniSONE (DELTASONE) 20 MG tablet Take 2 tablets (40 mg total) by mouth daily with breakfast. 10 tablet 0   albuterol (VENTOLIN HFA) 108 (90 Base) MCG/ACT inhaler Inhale 2 puffs into the lungs every 6 (six) hours as needed for wheezing or shortness of breath. 8 g 2   ANORO ELLIPTA 62.5-25 MCG/ACT AEPB INHALE 1 PUFF INTO THE LUNGS DAILY 60 each 2   azelastine (ASTELIN) 0.1 % nasal spray USE 2 SPRAYS IN EACH NOSTRIL TWICE DAILY 90 mL 1   dapagliflozin propanediol (FARXIGA) 10 MG TABS tablet TAKE 1 TABLET(10 MG) BY MOUTH DAILY BEFORE BREAKFAST 90 tablet 3   diltiazem (CARDIZEM CD) 240 MG 24 hr capsule TAKE 2 CAPSULES(480 MG) BY MOUTH DAILY 180 capsule 3   fluticasone (FLONASE) 50 MCG/ACT nasal spray Place 1 spray into both nostrils daily. 16 g 2   furosemide (LASIX) 80 MG tablet TAKE 1  TABLET(80 MG) BY MOUTH TWICE DAILY 180 tablet 3   loratadine (CLARITIN) 10 MG tablet TAKE 1 TABLET(10 MG) BY MOUTH DAILY 90 tablet 3   pantoprazole (PROTONIX) 20 MG tablet Take 1 tablet (20 mg total) by mouth daily. 90 tablet 3   potassium chloride SA (KLOR-CON M) 20 MEQ tablet Take 2 tablets (40 mEq total) by mouth 2 (two) times daily. TAKE 2 TABLETS(40 MEQ) BY MOUTH TWICE DAILY 120 tablet 3   simethicone (GAS-X) 80 MG chewable tablet Chew 1 tablet (80 mg total) by mouth 4 (four) times daily as needed for flatulence. 100 tablet 2   XARELTO 20 MG TABS tablet TAKE 1 TABLET(20 MG) BY MOUTH DAILY WITH SUPPER 90 tablet 2   No current facility-administered medications for this visit.     Review of Systems: Pertinent ROS listed in the A&P. Otherwise negative.   Objective:   Physical Exam: Vitals:   07/16/22 1035  BP: 125/68  Pulse: 96  Temp: 98.2 F (36.8 C)  TempSrc: Oral  SpO2: 98%  Weight: (!) 333 lb 3.2 oz (151.1 kg)  Height: 5\' 11"  (1.803 m)   Physical Exam Constitutional:      Appearance: Normal appearance.  HENT:     Head: Normocephalic and atraumatic.  Cardiovascular:     Comments: Irregularly irregular rhythm  Pulmonary:     Breath sounds: No wheezing.     Comments: On 3L O2.  Decreased inspiratory air movement. Crackles at the base of the lungs bilaterally.  Musculoskeletal:     Comments: 2+ pitting edema of the bilateral LE  Skin:    General: Skin is warm and dry.  Neurological:     General: No focal deficit present.     Mental Status: He is alert and oriented to person, place, and time.  Psychiatric:        Mood and Affect: Mood normal.        Behavior: Behavior normal.      Assessment & Plan:   COPD (chronic obstructive pulmonary disease) (HCC) Patient reports several months of worsening SOB as well as intermittent cough productive of green sputum. He is currently taking Ellipta daily as well as as needed albuterol inhaler. Reports that he requires use of  his inhaler 2-3 times a week. He has not had much improvement with his medications. He denies any recent fever, chills, rhinorrhea, illness, or known sick contacts. Exam reveals decrease inspiratory air movement as well as crackles at the bases of the lungs bilaterally. Suspect COPD exacerbation in the setting of seasonal allergies. Will add Flovent and 5 day course of prednisone.   > Anoro Ellipa 62.5-25 daily  > Prednisone 40 mg daily x5 days > Albuterol inhaler PRN   Allergic rhinitis Pt has a history of allergic rhinitis and reports several months of nasal congestion now with intermittent cough productive of green sputum. He does get short of breath with the cough. He denies any associated rhinorrhea, fever, chills, epistaxis, recent illness, or sick contacts. States symptoms seem to get worse in the Spring. He has been using Flonase, azelastine nasal spray,  Claritin, saline nasal rinse, and humidifier with no relief. States he was seen by ENT in the past, but has had no relief. Given chronic nature of his symptoms with no relief on multi-drug regimen, would recommend her follow up with allergist. Additionally, will add Flovent and 5 day course of predisone.   > Flonase 1 puff daily > Azelastine 2 sprays x2 daily  > Claritin 10 mg daily > Saline nasal rinse > Humidifier  > Begin Flovent x1 daily > Begin prednisone 40 mg daily x5 days > Allergist follow up  Chronic diastolic CHF (congestive heart failure) (HCC) Pt has history of CHF. States he has had increasing shortness of breath and swelling of his bilateral LE over the last several months. He is only able to walk from the front of his house to the back before becoming dyspneic. States this is not new for him. He does have orthopnea and sleeps on his side, which is also not new for him. Current medications include Farxiga 10 mg, furosemide 80 mg BID, and metolazone 2.5 mg x3 weekly. He has had a 19 pound weight gain in 6 months. Exam  reveals crackles at the base of the lungs bilaterally as well as 2+ pitting edema of the bilateral LE. Presentation is concerning for CHF exacerbation. His last BMP was normal 6 months ago. Will repeat BMP today and if normal, will increase dose of metolazone. Additionally, will encourage pt to follow up with his cardiologist (Dr. Tenny Craw).   > Farxiga 10 mg > Furosemide 80 mg BID > Metolazone x3 weekly (if  BMP is normal, will add extra dose) > BMP   Permanent atrial fibrillation Pt has history of permanent A.fib. He denies any palpitations, chest pain, or lightheadedness. Currently on Xarelto 20 mg and diltiazem 480 mg daily. Hear rate is 96  today with irregularly irregular rhythm. Will continue with current treatment regimen.  > Diltiazem 480 mg daily > Xarelto 20 mg daily   Tobacco use disorder Pt with history of tobacco use disorder. States he currently smokes 4-5 cigarettes a day with increase on the weekend. He has been using NRT (gum and patches), with mild reduction in his cravings. He does not wish to try medications at this time. Will continue to attempt cessation counseling.   > Nicoderm > Nicorette gum > Continued attempts at cessation counseling

## 2022-07-16 NOTE — Assessment & Plan Note (Signed)
Pt has a history of allergic rhinitis and reports several months of nasal congestion now with intermittent cough productive of green sputum. He does get short of breath with the cough. He denies any associated rhinorrhea, fever, chills, epistaxis, recent illness, or sick contacts. States symptoms seem to get worse in the Spring. He has been using Flonase, azelastine nasal spray,  Claritin, saline nasal rinse, and humidifier with no relief. States he was seen by ENT in the past, but has had no relief. Given chronic nature of his symptoms with no relief on multi-drug regimen, would recommend her follow up with allergist. Additionally, will add Flovent and 5 day course of predisone.   > Flonase 1 puff daily > Azelastine 2 sprays x2 daily  > Claritin 10 mg daily > Saline nasal rinse > Humidifier  > Begin Flovent x1 daily > Begin prednisone 40 mg daily x5 days > Allergist follow up

## 2022-07-16 NOTE — Assessment & Plan Note (Signed)
Pt has history of permanent A.fib. He denies any palpitations, chest pain, or lightheadedness. Currently on Xarelto 20 mg and diltiazem 480 mg daily. Hear rate is 96 today with irregularly irregular rhythm. Will continue with current treatment regimen.  > Diltiazem 480 mg daily > Xarelto 20 mg daily

## 2022-07-16 NOTE — Assessment & Plan Note (Signed)
Pt with history of tobacco use disorder. States he currently smokes 4-5 cigarettes a day with increase on the weekend. He has been using NRT (gum and patches), with mild reduction in his cravings. He does not wish to try medications at this time. Will continue to attempt cessation counseling.   > Nicoderm > Nicorette gum > Continued attempts at cessation counseling

## 2022-07-16 NOTE — Assessment & Plan Note (Signed)
Pt has history of CHF. States he has had increasing shortness of breath and swelling of his bilateral LE over the last several months. He is only able to walk from the front of his house to the back before becoming dyspneic. States this is not new for him. He does have orthopnea and sleeps on his side, which is also not new for him. Current medications include Farxiga 10 mg, furosemide 80 mg BID, and metolazone 2.5 mg x3 weekly. He has had a 19 pound weight gain in 6 months. Exam reveals crackles at the base of the lungs bilaterally as well as 2+ pitting edema of the bilateral LE. Presentation is concerning for CHF exacerbation. His last BMP was normal 6 months ago. Will repeat BMP today and if normal, will increase dose of metolazone. Additionally, will encourage pt to follow up with his cardiologist (Dr. Tenny Craw).   > Farxiga 10 mg > Furosemide 80 mg BID > Metolazone x3 weekly (if  BMP is normal, will add extra dose) > BMP

## 2022-07-16 NOTE — Assessment & Plan Note (Addendum)
Patient reports several months of worsening SOB as well as intermittent cough productive of green sputum. He is currently taking Ellipta daily as well as as needed albuterol inhaler. Reports that he requires use of his inhaler 2-3 times a week. He has not had much improvement with his medications. He denies any recent fever, chills, rhinorrhea, illness, or known sick contacts. Exam reveals decrease inspiratory air movement as well as crackles at the bases of the lungs bilaterally. Suspect COPD exacerbation in the setting of seasonal allergies. Will add Flovent and 5 day course of prednisone.   > Anoro Ellipa 62.5-25 daily  > Prednisone 40 mg daily x5 days > Albuterol inhaler PRN

## 2022-07-17 DIAGNOSIS — D649 Anemia, unspecified: Secondary | ICD-10-CM | POA: Insufficient documentation

## 2022-07-17 LAB — CBC WITH DIFFERENTIAL/PLATELET
Basophils Absolute: 0 10*3/uL (ref 0.0–0.2)
Basos: 1 %
EOS (ABSOLUTE): 0.2 10*3/uL (ref 0.0–0.4)
Eos: 3 %
Hematocrit: 32.1 % — ABNORMAL LOW (ref 37.5–51.0)
Hemoglobin: 9.7 g/dL — ABNORMAL LOW (ref 13.0–17.7)
Immature Grans (Abs): 0 10*3/uL (ref 0.0–0.1)
Immature Granulocytes: 0 %
Lymphocytes Absolute: 1.1 10*3/uL (ref 0.7–3.1)
Lymphs: 18 %
MCH: 24.7 pg — ABNORMAL LOW (ref 26.6–33.0)
MCHC: 30.2 g/dL — ABNORMAL LOW (ref 31.5–35.7)
MCV: 82 fL (ref 79–97)
Monocytes Absolute: 0.8 10*3/uL (ref 0.1–0.9)
Monocytes: 13 %
Neutrophils Absolute: 3.9 10*3/uL (ref 1.4–7.0)
Neutrophils: 65 %
Platelets: 297 10*3/uL (ref 150–450)
RBC: 3.93 x10E6/uL — ABNORMAL LOW (ref 4.14–5.80)
RDW: 20.2 % — ABNORMAL HIGH (ref 11.6–15.4)
WBC: 6 10*3/uL (ref 3.4–10.8)

## 2022-07-17 LAB — BMP8+ANION GAP
Anion Gap: 14 mmol/L (ref 10.0–18.0)
BUN/Creatinine Ratio: 19 (ref 10–24)
BUN: 14 mg/dL (ref 8–27)
CO2: 29 mmol/L (ref 20–29)
Calcium: 8.8 mg/dL (ref 8.6–10.2)
Chloride: 95 mmol/L — ABNORMAL LOW (ref 96–106)
Creatinine, Ser: 0.74 mg/dL — ABNORMAL LOW (ref 0.76–1.27)
Glucose: 94 mg/dL (ref 70–99)
Potassium: 4.3 mmol/L (ref 3.5–5.2)
Sodium: 138 mmol/L (ref 134–144)
eGFR: 104 mL/min/{1.73_m2} (ref >59)

## 2022-07-17 NOTE — Assessment & Plan Note (Addendum)
Patient with history of normocytic anemia, baseline 11.7-12.7. CBC yesterday showing Hgb 9.7 (MCV 82, elevated RDW).  This is likely chronic downtrend but will confirm with patient once able to reach him to assess if he is having any overt bleeding. He is on xarelto. If not acute, likely from anemia of chronic disease versus nutritional deficiency. He does have a follow up visit in 1 week so recommend repeating CBC, iron studies, folate, B12 level at that time.   Plan: -will attempt to reach out again to assess for symptoms of acute bleed (given on xarelto, will need to r/o GIB) -has f/u in 1 week -repeat CBC -check iron studies, folate, B12  ADDENDUM:  Spoke with patient via phone call. He states that he had a few days about 2 weeks ago where he noted darker colored stools after eating a spicy meal. This has since resolved and his stools have been normal for the past week and a half. Denies any N/V, hematochezia, hematemesis, abdominal pain, diarrhea. Have discussed with him to hold xarelto should he note recurrence of dark stools. Recommend repeat CBC at follow up visit next week. Consider GI referral for endoscopy should he have a continued downtrend at next visit.

## 2022-07-17 NOTE — Progress Notes (Signed)
Patient called.  Patient aware. He states that he had a few days about 2 weeks ago where he noted darker colored stools after eating a spicy meal. This has since resolved and his stools have been normal for the past week and a half. Have discussed with him to hold xarelto should he note recurrence of dark stools. Recommend repeat CBC at follow up visit next week. Consider GI referral for endoscopy should he have a continued downtrend at next visit.  Also advised him to take an extra dose of metolazone on Saturday in addition to his regular weekly Wednesday dose to help facilitate diuresis.

## 2022-07-17 NOTE — Progress Notes (Addendum)
Patient called.  Left message for patient to call back. CBC with worsening anemia, likely a chronic downtrend but will confirm with patient once able to reach him to assess if he is having any overt bleeding. He is on xarelto. If not acute, likely from anemia of chronic disease versus nutritional deficiency. He does have a follow up visit in 1 week so recommend repeating CBC, iron studies, folate, B12 at that time.   CMP with stable kidney function and electrolytes. Will recommend an extra dose of metolazone once I am able to reach him.

## 2022-07-17 NOTE — Progress Notes (Signed)
Internal Medicine Clinic Attending  Case discussed with Dr. Austin Miles  At the time of the visit.  We reviewed the resident's history and exam and pertinent patient test results.  I agree with the assessment, diagnosis, and plan of care documented in the resident's note.     At follow up visit, please perform thorough volume exam, check BMP. If overloaded, can consider swap to torsemide and/or carefully increasing metolazone dose - he developed hypokalemia on 3x weekly dosing previously.

## 2022-07-25 ENCOUNTER — Ambulatory Visit (INDEPENDENT_AMBULATORY_CARE_PROVIDER_SITE_OTHER): Payer: Medicaid Other | Admitting: Student

## 2022-07-25 ENCOUNTER — Encounter: Payer: Self-pay | Admitting: Student

## 2022-07-25 ENCOUNTER — Other Ambulatory Visit: Payer: Self-pay

## 2022-07-25 VITALS — BP 107/79 | HR 94 | Temp 98.1°F | Resp 24 | Ht 71.0 in | Wt 312.0 lb

## 2022-07-25 DIAGNOSIS — E876 Hypokalemia: Secondary | ICD-10-CM | POA: Diagnosis not present

## 2022-07-25 DIAGNOSIS — I5032 Chronic diastolic (congestive) heart failure: Secondary | ICD-10-CM | POA: Diagnosis not present

## 2022-07-25 DIAGNOSIS — K921 Melena: Secondary | ICD-10-CM

## 2022-07-25 DIAGNOSIS — D649 Anemia, unspecified: Secondary | ICD-10-CM | POA: Diagnosis not present

## 2022-07-25 NOTE — Assessment & Plan Note (Signed)
Most recent CBC on 5/1 showed a drop in hemoglobin from 11.7 to 9.7.  Patient reports 1 episode of dark-colored stool with hematuria about 3 weeks ago.  He has not noted any other episodes since then.  He denies any hematochezia or heartburn symptoms.  Patient was asked to hold Xarelto but he has been taking it.  He denies any other bleeding symptoms.  Patient had a colonoscopy and EGD in 2018 that only showed polyps but otherwise unremarkable.  He was asked to have a repeat colonoscopy in 3 years but could not tolerate anesthesia due to his breathing issue.  Patient just saw a urologist 6 months ago for his hematuria.  He reportedly had a cystoscopy there.  He was told that his hematuria was due to kidney stones but they could not proceed with lithotripsy due to his breathing issue again.  He could have a slow bleed from GI or GU source.  Will recheck CBC today and advised patient to hold Xarelto until the result comes back.  Also check for iron storage and B12 as well.  -Placed a new referral to GI for repeat colonoscopy.  His breathing has improved some he may tolerate anesthesia now. -Patient will bring paperwork from his urologist to his next visit in 2 weeks.

## 2022-07-25 NOTE — Assessment & Plan Note (Addendum)
Patient is here for 1 week follow-up for heart failure exacerbation.  His weight was 333 pounds at last visit.  Patient was instructed to continue Farxiga, Lasix 80 mg twice daily and increase metolazone 2.5 mg from once to twice weekly.  Patient reports good urine output with his new dose of metolazone.  He said that his breathing has improved significantly.  Patient states that he was on metolazone in the past but was discontinued due to low potassium.  Regarding the cause of his exacerbation, patient thinks that he is eating out grilled food with his friends more since the weather is getting warmer.  He denies any new chest pain.  His physical exam is reassuring today.  He only has trace edema of bilateral lower extremity without any lung crackles.  There was no JVD.  His weight is down to 312 pounds today.  -Will continue Lasix 80 mg twice daily and metolazone 2.5 mg twice weekly for now to maintain euvolemia -Recheck BMP for kidney function and potassium today -Advised patient on reduced salt intake -Obtain a new echocardiogram to look for any change in EF. -Follow-up in 2 weeks for BMP and symptoms reassessment  Addendum Recheck BMP showed stable kidney function. K is low at 3.1. Since patient is doing better on his salt intake, will resume previous Metolazone dose 25 mg once weekly to avoid worsening hypokalemia. Advised patient to take 1 extra dose of his Kcl 40 mEq tomorrow and resume his BID dosing. Patient will return to clinic next week for BMP and Mag recheck.

## 2022-07-25 NOTE — Patient Instructions (Addendum)
[name],  It was nice seeing you in the clinic today.  I am glad you are doing better.  For your heart failure, please continue Lasix 80 mg twice daily with metolazone twice weekly.  I will recheck your kidney function and potassium today.  I will also obtain an ultrasound of your heart to look for the pumping action.  Please try to cut back on salty food as much as you can.  2.  I will also recheck your blood count today.  Please hold your blood thinner until I call you.  3.  Please trying to obtain the name of the urologist that you went to.  4.  I will place a referral for you to come back to the stomach doctor for repeat colonoscopy  Please return in 2 weeks for follow-up  Dr. Cyndie Chime

## 2022-07-25 NOTE — Progress Notes (Addendum)
CC: 1 week follow-up for heart failure exacerbation  HPI:  [name] is a 62 y.o. living with hypertension, HFpEF, COPD on 3 L, A-fib on Xarelto, who presented here for 1 week follow-up for his heart failure exacerbation.  Please see problem based charting for detail  Past Medical History:  Diagnosis Date   Acute exacerbation of CHF (congestive heart failure) (HCC) 12/15/2019   Acute heart failure (HCC) 12/16/2019   Atrial fibrillation and flutter (HCC) 08/17/2015   A. S/p failed DCCV // b. Severe BAE on echo >> rate control strategy (has seen AF clinic) // c. Xarelto for anticoag (CHADS2-VASc=2 / CHF, HTN)   Chronic diastolic CHF (congestive heart failure) (HCC) 08/30/2015   A. Echo 6/17: Apical HK, moderate focal basal and mild concentric LVH, EF 50-55, diffuse HK, trivial MR, severe BAE, mild TR, PASP 37   Dependence on continuous supplemental oxygen    3L   Hepatitis C, chronic (HCC) 08/19/2015   History of cardiac catheterization    a. LHC 6/17: LAD irregs, o/w no CAD   Hypertension    Hyposmia 12/22/2017   OSA (obstructive sleep apnea) 11/21/2015   No Cpap   Pleural effusion on right 03/14/2018   Sleep apnea    Tobacco abuse    Review of Systems:  per HPI  Physical Exam:  Vitals:   07/25/22 0937  BP: 107/79  Pulse: 94  Resp: (!) 24  Temp: 98.1 F (36.7 C)  TempSrc: Oral  SpO2: 100%  Weight: (!) 312 lb (141.5 kg)  Height: 5\' 11"  (1.803 m)   Physical Exam Constitutional:      General: He is not in acute distress.    Appearance: He is not ill-appearing.  HENT:     Nose:     Comments: Nasal cannula in place.  3 L of oxygen. Eyes:     General:        Right eye: No discharge.        Left eye: No discharge.     Conjunctiva/sclera: Conjunctivae normal.  Cardiovascular:     Rate and Rhythm: Normal rate and regular rhythm.     Comments: Trace LE edema.  No JVD Pulmonary:     Effort: Pulmonary effort is normal.     Breath sounds: Normal breath sounds.      Comments: Normal work of breathing.  Poor airway movement in lung bases but no crackles. Musculoskeletal:        General: Normal range of motion.     Cervical back: Normal range of motion.  Skin:    General: Skin is warm.  Neurological:     Mental Status: He is alert. Mental status is at baseline.  Psychiatric:        Mood and Affect: Mood normal.      Assessment & Plan:   See Encounters Tab for problem based charting.  Chronic diastolic CHF (congestive heart failure) (HCC) Patient is here for 1 week follow-up for heart failure exacerbation.  His weight was 333 pounds at last visit.  Patient was instructed to continue Farxiga, Lasix 80 mg twice daily and increase metolazone 2.5 mg from once to twice weekly.  Patient reports good urine output with his new dose of metolazone.  He said that his breathing has improved significantly.  Patient states that he was on metolazone in the past but was discontinued due to low potassium.  Regarding the cause of his exacerbation, patient thinks that he is eating out grilled food  with his friends more since the weather is getting warmer.  He denies any new chest pain.  His physical exam is reassuring today.  He only has trace edema of bilateral lower extremity without any lung crackles.  There was no JVD.  His weight is down to 312 pounds today.  -Will continue Lasix 80 mg twice daily and metolazone 2.5 mg twice weekly for now to maintain euvolemia -Recheck BMP for kidney function and potassium today -Advised patient on reduced salt intake -Obtain a new echocardiogram to look for any change in EF. -Follow-up in 2 weeks for BMP and symptoms reassessment  Addendum Recheck BMP showed stable kidney function. K is low at 3.1. Since patient is doing better on his salt intake, will resume previous Metolazone dose 25 mg once weekly to avoid worsening hypokalemia. Advised patient to take 1 extra dose of his Kcl 40 mEq tomorrow and resume his BID dosing.  Patient will return to clinic next week for BMP and Mag recheck.   Normocytic anemia Most recent CBC on 5/1 showed a drop in hemoglobin from 11.7 to 9.7.  Patient reports 1 episode of dark-colored stool with hematuria about 3 weeks ago.  He has not noted any other episodes since then.  He denies any hematochezia or heartburn symptoms.  Patient was asked to hold Xarelto but he has been taking it.  He denies any other bleeding symptoms.  Patient had a colonoscopy and EGD in 2018 that only showed polyps but otherwise unremarkable.  He was asked to have a repeat colonoscopy in 3 years but could not tolerate anesthesia due to his breathing issue.  Patient just saw a urologist 6 months ago for his hematuria.  He reportedly had a cystoscopy there.  He was told that his hematuria was due to kidney stones but they could not proceed with lithotripsy due to his breathing issue again.  He could have a slow bleed from GI or GU source.  Will recheck CBC today and advised patient to hold Xarelto until the result comes back.  Also check for iron storage and B12 as well.  -Placed a new referral to GI for repeat colonoscopy.  His breathing has improved some he may tolerate anesthesia now. -Patient will bring paperwork from his urologist to his next visit in 2 weeks.   Patient discussed with Dr.  Sol Blazing

## 2022-07-26 LAB — VITAMIN B12: Vitamin B-12: 601 pg/mL (ref 232–1245)

## 2022-07-26 LAB — CBC
Hematocrit: 35.7 % — ABNORMAL LOW (ref 37.5–51.0)
Hemoglobin: 11.1 g/dL — ABNORMAL LOW (ref 13.0–17.7)
MCH: 24.6 pg — ABNORMAL LOW (ref 26.6–33.0)
MCHC: 31.1 g/dL — ABNORMAL LOW (ref 31.5–35.7)
MCV: 79 fL (ref 79–97)
Platelets: 307 10*3/uL (ref 150–450)
RBC: 4.52 x10E6/uL (ref 4.14–5.80)
RDW: 20.6 % — ABNORMAL HIGH (ref 11.6–15.4)
WBC: 7 10*3/uL (ref 3.4–10.8)

## 2022-07-26 LAB — BMP8+ANION GAP
Anion Gap: 16 mmol/L (ref 10.0–18.0)
BUN/Creatinine Ratio: 20 (ref 10–24)
BUN: 16 mg/dL (ref 8–27)
CO2: 33 mmol/L — ABNORMAL HIGH (ref 20–29)
Calcium: 9.2 mg/dL (ref 8.6–10.2)
Chloride: 84 mmol/L — ABNORMAL LOW (ref 96–106)
Creatinine, Ser: 0.81 mg/dL (ref 0.76–1.27)
Glucose: 98 mg/dL (ref 70–99)
Potassium: 3.1 mmol/L — ABNORMAL LOW (ref 3.5–5.2)
Sodium: 133 mmol/L — ABNORMAL LOW (ref 134–144)
eGFR: 101 mL/min/{1.73_m2} (ref >59)

## 2022-07-26 LAB — IRON,TIBC AND FERRITIN PANEL
Ferritin: 28 ng/mL — ABNORMAL LOW (ref 30–400)
Iron Saturation: 15 % (ref 15–55)
Iron: 67 ug/dL (ref 38–169)
Total Iron Binding Capacity: 438 ug/dL (ref 250–450)
UIBC: 371 ug/dL — ABNORMAL HIGH (ref 111–343)

## 2022-07-26 LAB — RETICULOCYTES: Retic Ct Pct: 1.9 % (ref 0.6–2.6)

## 2022-07-26 MED ORDER — POTASSIUM CHLORIDE CRYS ER 20 MEQ PO TBCR: 40.0000 meq | EXTENDED_RELEASE_TABLET | Freq: Three times a day (TID) | ORAL | 2 refills | Status: DC

## 2022-07-26 MED ORDER — POTASSIUM CHLORIDE CRYS ER 20 MEQ PO TBCR: 40.0000 meq | EXTENDED_RELEASE_TABLET | Freq: Two times a day (BID) | ORAL | 2 refills | Status: DC

## 2022-07-26 NOTE — Addendum Note (Signed)
Addended byDoran Stabler on: 07/26/2022 10:56 AM   Modules accepted: Orders

## 2022-07-26 NOTE — Addendum Note (Signed)
Addended byDoran Stabler on: 07/26/2022 03:54 PM   Modules accepted: Orders

## 2022-07-29 NOTE — Progress Notes (Signed)
Internal Medicine Clinic Attending  Case discussed with Dr. Nguyen  At the time of the visit.  We reviewed the resident's history and exam and pertinent patient test results.  I agree with the assessment, diagnosis, and plan of care documented in the resident's note. 

## 2022-08-01 ENCOUNTER — Telehealth: Payer: Self-pay

## 2022-08-01 ENCOUNTER — Ambulatory Visit (INDEPENDENT_AMBULATORY_CARE_PROVIDER_SITE_OTHER): Payer: Medicaid Other | Admitting: Student

## 2022-08-01 ENCOUNTER — Encounter: Payer: Self-pay | Admitting: Student

## 2022-08-01 VITALS — BP 117/60 | HR 92 | Wt 316.8 lb

## 2022-08-01 DIAGNOSIS — D649 Anemia, unspecified: Secondary | ICD-10-CM

## 2022-08-01 DIAGNOSIS — I5032 Chronic diastolic (congestive) heart failure: Secondary | ICD-10-CM | POA: Diagnosis not present

## 2022-08-01 DIAGNOSIS — I77810 Thoracic aortic ectasia: Secondary | ICD-10-CM

## 2022-08-01 MED ORDER — FERROUS SULFATE 325 (65 FE) MG PO TBEC
325.0000 mg | DELAYED_RELEASE_TABLET | ORAL | 3 refills | Status: DC
Start: 2022-08-01 — End: 2022-08-28

## 2022-08-01 NOTE — Assessment & Plan Note (Addendum)
Patient is here for HFpEF exacerbation follow-up.  He reports doing well without shortness of breath or increased lower extremity edema.  Patient is taking Lasix 80 mg twice daily, metolazone 2.5 mg weekly and potassium supplement 40 mEq twice daily.  He does take an extra dose of potassium on the day of metolazone.  He states that he has been cutting back on salt intake.  Patient appears euvolemic on exam today.  His weight trended up slightly from 312 to 316 lbs. He has no LE edema or JVD.  No crackles on lungs auscultation.  I think we reach a good regimen for his diuretics.  Reducing salt intake plays a big role in controlling his heart failure.  -Continue Lasix 80 mg twice daily, metolazone 2.5 mg weekly and potassium 40 mEq BID -Repeat BMP today -Encourage patient on low-salt intake -Follow-up with cardiology in [facility] in about 4 weeks -Pending echocardiogram

## 2022-08-01 NOTE — Progress Notes (Signed)
CC: F/u on HFpEF  HPI:  [name] is a 62 y.o. living with HFpEF, COPD, chronic hypoxic respiratory failure on 3 L, A-fib on Eliquis, who presents to the clinic for HFpEF exacerbation follow-up.  Please see problem based charting for detail  Past Medical History:  Diagnosis Date   Acute exacerbation of CHF (congestive heart failure) (HCC) 12/15/2019   Acute heart failure (HCC) 12/16/2019   Atrial fibrillation and flutter (HCC) 08/17/2015   A. S/p failed DCCV // b. Severe BAE on echo >> rate control strategy (has seen AF clinic) // c. Xarelto for anticoag (CHADS2-VASc=2 / CHF, HTN)   Chronic diastolic CHF (congestive heart failure) (HCC) 08/30/2015   A. Echo 6/17: Apical HK, moderate focal basal and mild concentric LVH, EF 50-55, diffuse HK, trivial MR, severe BAE, mild TR, PASP 37   Dependence on continuous supplemental oxygen    3L   Hepatitis C, chronic (HCC) 08/19/2015   History of cardiac catheterization    a. LHC 6/17: LAD irregs, o/w no CAD   Hypertension    Hyposmia 12/22/2017   OSA (obstructive sleep apnea) 11/21/2015   No Cpap   Pleural effusion on right 03/14/2018   Sleep apnea    Tobacco abuse    Review of Systems:  per HPI  Physical Exam:  Vitals:   08/01/22 0856  BP: 117/60  Pulse: 92  SpO2: 97%  Weight: (!) 316 lb 12.8 oz (143.7 kg)   Physical Exam Constitutional:      General: He is not in acute distress.    Appearance: He is not ill-appearing.  Eyes:     General:        Right eye: No discharge.        Left eye: No discharge.     Conjunctiva/sclera: Conjunctivae normal.  Cardiovascular:     Rate and Rhythm: Normal rate. Rhythm irregular.     Comments: No LE edema.  No JVD.  Compression stocking in place. Pulmonary:     Effort: Pulmonary effort is normal. No respiratory distress.     Breath sounds: Normal breath sounds. No wheezing.     Comments: Diminished breath sounds at bilateral lung bases with no crackles or wheezing Skin:    General:  Skin is warm.  Neurological:     Mental Status: He is alert. Mental status is at baseline.  Psychiatric:        Mood and Affect: Mood normal.      Assessment & Plan:   See Encounters Tab for problem based charting.  Chronic diastolic CHF (congestive heart failure) (HCC) Patient is here for HFpEF exacerbation follow-up.  He reports doing well without shortness of breath or increased lower extremity edema.  Patient is taking Lasix 80 mg twice daily, metolazone 2.5 mg weekly and potassium supplement 40 mEq twice daily.  He does take an extra dose of potassium on the day of metolazone.  He states that he has been cutting back on salt intake.  Patient appears euvolemic on exam today.  His weight trended up slightly from 312 to 316 lbs. He has no LE edema or JVD.  No crackles on lungs auscultation.  I think we reach a good regimen for his diuretics.  Reducing salt intake plays a big role in controlling his heart failure.  -Continue Lasix 80 mg twice daily, metolazone 2.5 mg weekly and potassium 40 mEq BID -Repeat BMP today -Encourage patient on low-salt intake -Follow-up with cardiology in [facility] in about 4 weeks -  Pending echocardiogram  Normocytic anemia Repeat CBC showed improved hemoglobin of 11.1 with a mild iron deficiency (ferritin 28) likely from chronic blood loss.  -Resume Xarelto -Start ferrous sulfate 325 mg every other day -Pending follow-up with GI for repeat colonoscopy -Obtain records from his urologist.  [facility] will contact [facility] for follow-up appointment   Patient discussed with Dr.  Sol Blazing

## 2022-08-01 NOTE — Telephone Encounter (Signed)
Returned pt call - stated his insurance will not pay for his medication. Stated he was seen this am.  He does not know the name of med. I asked pt to look at the AVS. The med is Ferrous sulfate - informed pt this can be purchased OTC.  I called [facility] who stated it is OTC and not covered by his  insurance. The cost will be around $12.00. I called pt back. Stated he's on a fixed income. Might not be able to buy it until the end of the month. Then he stated the pharmacy told him to try the [employer].

## 2022-08-01 NOTE — Patient Instructions (Signed)
Frank Morales,  It was nice seeing you in the clinic today.  I am glad you are doing well.  Please continue the current dose of diuretics: Lasix 80 mg twice daily, metolazone 25 mg weekly and potassium 40 mEq twice daily.  I am glad you are cutting back on the salt intake.  Please follow-up with a cardiologist in 4-week as scheduled.  Please contact your urologist for a follow-up appointment.  I started an iron supplementation because your iron is low, likely from chronic bleeding.  Please follow-up in 4 weeks, sooner if needed  Dr. Cyndie Chime

## 2022-08-01 NOTE — Telephone Encounter (Signed)
Requesting to speak with a nurse about medication. States insurance will not covered for the medication. Pt do not know the name of the medicine. Please call pt back.

## 2022-08-01 NOTE — Assessment & Plan Note (Signed)
Repeat CBC showed improved hemoglobin of 11.1 with a mild iron deficiency (ferritin 28) likely from chronic blood loss.  -Resume Xarelto -Start ferrous sulfate 325 mg every other day -Pending follow-up with GI for repeat colonoscopy -Obtain records from his urologist.  Patient will contact [facility] for follow-up appointment

## 2022-08-02 LAB — BMP8+ANION GAP
Anion Gap: 16 mmol/L (ref 10.0–18.0)
BUN/Creatinine Ratio: 16 (ref 10–24)
BUN: 14 mg/dL (ref 8–27)
CO2: 30 mmol/L — ABNORMAL HIGH (ref 20–29)
Calcium: 8.9 mg/dL (ref 8.6–10.2)
Chloride: 92 mmol/L — ABNORMAL LOW (ref 96–106)
Creatinine, Ser: 0.88 mg/dL (ref 0.76–1.27)
Glucose: 87 mg/dL (ref 70–99)
Potassium: 3.7 mmol/L (ref 3.5–5.2)
Sodium: 138 mmol/L (ref 134–144)
eGFR: 98 mL/min/{1.73_m2} (ref >59)

## 2022-08-06 ENCOUNTER — Encounter: Payer: Self-pay | Admitting: Gastroenterology

## 2022-08-06 NOTE — Progress Notes (Signed)
Internal Medicine Clinic Attending  Case discussed with Dr. Nguyen  At the time of the visit.  We reviewed the resident's history and exam and pertinent patient test results.  I agree with the assessment, diagnosis, and plan of care documented in the resident's note. 

## 2022-08-06 NOTE — Addendum Note (Signed)
Addended by: Dickie La on: 08/06/2022 02:11 PM   Modules accepted: Level of Service

## 2022-08-07 DIAGNOSIS — I5032 Chronic diastolic (congestive) heart failure: Secondary | ICD-10-CM | POA: Diagnosis not present

## 2022-08-07 DIAGNOSIS — I509 Heart failure, unspecified: Secondary | ICD-10-CM | POA: Diagnosis not present

## 2022-08-08 ENCOUNTER — Ambulatory Visit (HOSPITAL_COMMUNITY)
Admission: RE | Admit: 2022-08-08 | Discharge: 2022-08-08 | Disposition: A | Discharge status: Home / Self Care | Payer: Medicaid Other | Source: Ambulatory Visit | Attending: Internal Medicine | Admitting: Internal Medicine

## 2022-08-08 ENCOUNTER — Encounter: Payer: Medicaid Other | Admitting: Student

## 2022-08-08 DIAGNOSIS — F17200 Nicotine dependence, unspecified, uncomplicated: Secondary | ICD-10-CM | POA: Insufficient documentation

## 2022-08-08 DIAGNOSIS — I5032 Chronic diastolic (congestive) heart failure: Secondary | ICD-10-CM | POA: Insufficient documentation

## 2022-08-08 DIAGNOSIS — I110 Hypertensive heart disease with heart failure: Secondary | ICD-10-CM | POA: Diagnosis not present

## 2022-08-08 DIAGNOSIS — G4730 Sleep apnea, unspecified: Secondary | ICD-10-CM | POA: Insufficient documentation

## 2022-08-08 DIAGNOSIS — I4891 Unspecified atrial fibrillation: Secondary | ICD-10-CM | POA: Diagnosis not present

## 2022-08-08 LAB — ECHOCARDIOGRAM COMPLETE
AR max vel: 3.72 cm2
AV Area VTI: 3.94 cm2
AV Area mean vel: 3.78 cm2
AV Mean grad: 4.3 mmHg
AV Peak grad: 8.2 mmHg
Ao pk vel: 1.43 m/s
Area-P 1/2: 5.21 cm2
Calc EF: 61.9 %
MV VTI: 5.52 cm2
S' Lateral: 3.9 cm
Single Plane A2C EF: 35.2 %
Single Plane A4C EF: 77.9 %

## 2022-08-09 ENCOUNTER — Telehealth: Payer: Self-pay | Admitting: Student

## 2022-08-09 DIAGNOSIS — I77810 Thoracic aortic ectasia: Secondary | ICD-10-CM | POA: Insufficient documentation

## 2022-08-09 NOTE — Assessment & Plan Note (Signed)
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  Will need annual echocardiogram to monitor.

## 2022-08-09 NOTE — Telephone Encounter (Signed)
Pt rtn a call  back about his Echo Test Results on yesterday.

## 2022-08-17 ENCOUNTER — Other Ambulatory Visit: Payer: Self-pay | Admitting: Internal Medicine

## 2022-08-17 DIAGNOSIS — I4891 Unspecified atrial fibrillation: Secondary | ICD-10-CM

## 2022-08-17 DIAGNOSIS — I5033 Acute on chronic diastolic (congestive) heart failure: Secondary | ICD-10-CM

## 2022-08-19 NOTE — Telephone Encounter (Signed)
Next appt scheduled 6/12 with PCP.   

## 2022-08-20 ENCOUNTER — Other Ambulatory Visit: Payer: Self-pay

## 2022-08-20 DIAGNOSIS — R0981 Nasal congestion: Secondary | ICD-10-CM

## 2022-08-20 DIAGNOSIS — R1013 Epigastric pain: Secondary | ICD-10-CM

## 2022-08-20 DIAGNOSIS — I5032 Chronic diastolic (congestive) heart failure: Secondary | ICD-10-CM

## 2022-08-20 MED ORDER — PANTOPRAZOLE SODIUM 20 MG PO TBEC
20.0000 mg | DELAYED_RELEASE_TABLET | Freq: Every day | ORAL | 3 refills | Status: DC
Start: 2022-08-20 — End: 2022-10-25

## 2022-08-20 MED ORDER — METOLAZONE 2.5 MG PO TABS: 2.5000 mg | ORAL_TABLET | ORAL | 0 refills | Status: DC

## 2022-08-20 MED ORDER — DAPAGLIFLOZIN PROPANEDIOL 10 MG PO TABS
ORAL_TABLET | ORAL | 3 refills | Status: DC
Start: 2022-08-20 — End: 2023-10-13

## 2022-08-20 MED ORDER — LORATADINE 10 MG PO TABS
ORAL_TABLET | ORAL | 3 refills | Status: DC
Start: 2022-08-20 — End: 2022-09-13

## 2022-08-28 ENCOUNTER — Other Ambulatory Visit: Payer: Self-pay

## 2022-08-28 ENCOUNTER — Encounter: Payer: Self-pay | Admitting: Student

## 2022-08-28 ENCOUNTER — Ambulatory Visit (INDEPENDENT_AMBULATORY_CARE_PROVIDER_SITE_OTHER): Payer: Medicaid Other | Admitting: Student

## 2022-08-28 VITALS — BP 101/61 | HR 98 | Temp 98.0°F | Ht 71.0 in | Wt 317.2 lb

## 2022-08-28 DIAGNOSIS — F17210 Nicotine dependence, cigarettes, uncomplicated: Secondary | ICD-10-CM | POA: Diagnosis not present

## 2022-08-28 DIAGNOSIS — D649 Anemia, unspecified: Secondary | ICD-10-CM | POA: Diagnosis not present

## 2022-08-28 DIAGNOSIS — I5032 Chronic diastolic (congestive) heart failure: Secondary | ICD-10-CM | POA: Diagnosis present

## 2022-08-28 DIAGNOSIS — G4733 Obstructive sleep apnea (adult) (pediatric): Secondary | ICD-10-CM | POA: Diagnosis not present

## 2022-08-28 MED ORDER — FERROUS SULFATE 325 (65 FE) MG PO TABS: 325.0000 mg | ORAL_TABLET | ORAL | 3 refills | Status: DC

## 2022-08-28 NOTE — Assessment & Plan Note (Addendum)
Patient was able to receive iron supplements, states due to insurance.  Unsure as to what this happened, regardless we will refill.  Last iron studies showed hemoglobin of 11.1, and mild iron deficiency with ferritin of 28.  He has follow-up with GI on August 28 where he will most likely undergo a colonoscopy.  He denies any signs of bleeding including bloody stools or hematuria.  Plan: - Will resend in ferrous sulfate 325 Monday Wednesday Friday regimen

## 2022-08-28 NOTE — Progress Notes (Signed)
CC: Heart failure follow-up  HPI:  [name] is a 62 y.o. male living with a history stated below and presents today for heart failure follow-up. Please see problem based assessment and plan for additional details.  Past Medical History:  Diagnosis Date   Acute exacerbation of CHF (congestive heart failure) (HCC) 12/15/2019   Acute heart failure (HCC) 12/16/2019   Atrial fibrillation and flutter (HCC) 08/17/2015   A. S/p failed DCCV // b. Severe BAE on echo >> rate control strategy (has seen AF clinic) // c. Xarelto for anticoag (CHADS2-VASc=2 / CHF, HTN)   Chronic diastolic CHF (congestive heart failure) (HCC) 08/30/2015   A. Echo 6/17: Apical HK, moderate focal basal and mild concentric LVH, EF 50-55, diffuse HK, trivial MR, severe BAE, mild TR, PASP 37   Dependence on continuous supplemental oxygen    3L   Hematuria 01/10/2021   Hepatic fibrosis 02/29/2016   F3/4 on elastography.    Hepatitis C, chronic (HCC) 08/19/2015   Hepatitis C, chronic (HCC) 08/19/2015   Treated in 2017 with SVR   History of cardiac catheterization    a. LHC 6/17: LAD irregs, o/w no CAD   Hypertension    Hyposmia 12/22/2017   OSA (obstructive sleep apnea) 11/21/2015   No Cpap   Pleural effusion on right 03/14/2018   Sleep apnea    Tobacco abuse     Current Outpatient Medications on File Prior to Visit  Medication Sig Dispense Refill   albuterol (VENTOLIN HFA) 108 (90 Base) MCG/ACT inhaler Inhale 2 puffs into the lungs every 6 (six) hours as needed for wheezing or shortness of breath. 8 g 2   ANORO ELLIPTA 62.5-25 MCG/ACT AEPB INHALE 1 PUFF INTO THE LUNGS DAILY 60 each 2   azelastine (ASTELIN) 0.1 % nasal spray USE 2 SPRAYS IN EACH NOSTRIL TWICE DAILY 90 mL 1   dapagliflozin propanediol (FARXIGA) 10 MG TABS tablet TAKE 1 TABLET(10 MG) BY MOUTH DAILY BEFORE BREAKFAST 90 tablet 3   diltiazem (CARDIZEM CD) 240 MG 24 hr capsule TAKE 2 CAPSULES(480 MG) BY MOUTH DAILY 180 capsule 3    fluticasone (FLONASE) 50 MCG/ACT nasal spray Place 1 spray into both nostrils daily. 16 g 2   furosemide (LASIX) 80 MG tablet TAKE 1 TABLET(80 MG) BY MOUTH TWICE DAILY 180 tablet 3   loratadine (CLARITIN) 10 MG tablet TAKE 1 TABLET(10 MG) BY MOUTH DAILY 90 tablet 3   metolazone (ZAROXOLYN) 2.5 MG tablet Take 1 tablet (2.5 mg total) by mouth once a week. Take every Wednesday. 30 tablet 0   pantoprazole (PROTONIX) 20 MG tablet Take 1 tablet (20 mg total) by mouth daily. 90 tablet 3   potassium chloride SA (KLOR-CON M) 20 MEQ tablet Take 2 tablets (40 mEq total) by mouth 2 (two) times daily. 120 tablet 2   predniSONE (DELTASONE) 20 MG tablet Take 2 tablets (40 mg total) by mouth daily with breakfast. 10 tablet 0   XARELTO 20 MG TABS tablet TAKE 1 TABLET(20 MG) BY MOUTH DAILY WITH SUPPER 90 tablet 2   No current facility-administered medications on file prior to visit.    Family History  Problem Relation Age of Onset   Hypertension Mother    Diabetes Mother    Pneumonia Father    Hypertension Maternal Grandfather    Colon cancer Neg Hx     Social History   Socioeconomic History   Marital status: Single    Spouse name: Not on file   Number of children:  Not on file   Years of education: Not on file   Highest education level: Not on file  Occupational History   Occupation: unemployed    Comment: Previous cook   Tobacco Use   Smoking status: Every Day    Packs/day: .5    Types: Cigarettes   Smokeless tobacco: Never   Tobacco comments:    5 per day  Vaping Use   Vaping Use: Never used  Substance and Sexual Activity   Alcohol use: Yes    Alcohol/week: 5.0 standard drinks of alcohol    Types: 5 Standard drinks or equivalent per week    Comment: 2 times a week beer   Drug use: No   Sexual activity: Not on file  Other Topics Concern   Not on file  Social History Narrative   ** Merged History Encounter **       Social Determinants of Health   Financial Resource Strain: Not  on file  Food Insecurity: No Food Insecurity (04/16/2022)   Hunger Vital Sign    Worried About Running Out of Food in the Last Year: Never true    Ran Out of Food in the Last Year: Never true  Transportation Needs: Unmet Transportation Needs (04/16/2022)   PRAPARE - Administrator, Civil Service (Medical): Yes    Lack of Transportation (Non-Medical): Yes  Physical Activity: Not on file  Stress: Not on file  Social Connections: Socially Isolated (04/16/2022)   Social Connection and Isolation Panel [NHANES]    Frequency of Communication with Friends and Family: More than three times a week    Frequency of Social Gatherings with Friends and Family: Twice a week    Attends Religious Services: Never    Database administrator or Organizations: No    Attends Banker Meetings: Never    Marital Status: Never married  Intimate Partner Violence: Not At Risk (04/16/2022)   Humiliation, Afraid, Rape, and Kick questionnaire    Fear of Current or Ex-Partner: No    Emotionally Abused: No    Physically Abused: No    Sexually Abused: No    Review of Systems: ROS negative except for what is noted on the assessment and plan.  Vitals:   08/28/22 0955  BP: 101/61  Pulse: 98  Temp: 98 F (36.7 C)  TempSrc: Oral  SpO2: 100%  Weight: (!) 317 lb 3.2 oz (143.9 kg)  Height: 5\' 11"  (1.803 m)    Physical Exam: Constitutional: Appears older than stated age male, in no acute distress  HENT: normocephalic atraumatic, mucous membranes moist, on home 3 L nasal cannula Neck: supple, no JVD appreciated Cardiovascular: regular rate and irregular rhythm, no m/r/g, no lower extremity edema seen Pulmonary/Chest: normal work of breathing on room air, lungs clear to auscultation bilaterally Abdominal: soft, non-tender, non-distended    Assessment & Plan:   Chronic diastolic CHF (congestive heart failure) (HCC) Patient presents for follow-up for heart failure with preserved ejection  fraction.  Recent echo showed an EF of 55 to 60% with left ventricular hypertrophy, moderately dilated left atrium, and aortic dilatation of 43 mm.  His current regimen is Lasix 80 mg twice a day, metolazone 1.5 mg weekly, Farxiga 10 mg.  He states he is doing well with these medications, and is compliant.  He denies any signs of volume overload.  His weight has increased just slightly from 316 to 317 on today's encounter.  Mobility exam: Did not appreciate any lower extremity edema,  abnormal heart sounds, or crackles/rales in lung auscultation.  He denies any chest pain, shortness of breath.  He has follow-up with cardiology tomorrow.  Plan: - Continue Lasix 80 mg twice a day - Continue metolazone weekly - Continue Farxiga 10 mg  Normocytic anemia Patient was able to receive iron supplements, states due to insurance.  Unsure as to what this happened, regardless we will refill.  Last iron studies showed hemoglobin of 11.1, and mild iron deficiency with ferritin of 28.  He has follow-up with GI on August 28 where he will most likely undergo a colonoscopy.  He denies any signs of bleeding including bloody stools or hematuria.  Plan: - Will resend in ferrous sulfate 325 Monday Wednesday Friday regimen  OSA (obstructive sleep apnea) Patient with history of obstructive sleep apnea, STOP-BANG score around 5, and has undergone a sleep study in 2017, which and recommended CPAP use.  He was unable to get it at that time.  He does still endorse symptoms of waking up feeling hypoxic, and daytime somnolence.  CPAP machine would help patient greatly in terms of getting proper rest, as well as taking strain off of his heart at night.  Will place DME order for CPAP machine.  Plan: - CPAP order placed  Patient discussed with Dr. Rockey Situ, M.D. [facility], PGY-1 Pager: [phone] Date 08/28/2022 Time 11:19 AM

## 2022-08-28 NOTE — Assessment & Plan Note (Signed)
Patient with history of obstructive sleep apnea, STOP-BANG score around 5, and has undergone a sleep study in 2017, which and recommended CPAP use.  He was unable to get it at that time.  He does still endorse symptoms of waking up feeling hypoxic, and daytime somnolence.  CPAP machine would help patient greatly in terms of getting proper rest, as well as taking strain off of his heart at night.  Will place DME order for CPAP machine.  Plan: - CPAP order placed

## 2022-08-28 NOTE — Assessment & Plan Note (Signed)
Patient presents for follow-up for heart failure with preserved ejection fraction.  Recent echo showed an EF of 55 to 60% with left ventricular hypertrophy, moderately dilated left atrium, and aortic dilatation of 43 mm.  His current regimen is Lasix 80 mg twice a day, metolazone 1.5 mg weekly, Farxiga 10 mg.  He states he is doing well with these medications, and is compliant.  He denies any signs of volume overload.  His weight has increased just slightly from 316 to 317 on today's encounter.  Mobility exam: Did not appreciate any lower extremity edema, abnormal heart sounds, or crackles/rales in lung auscultation.  He denies any chest pain, shortness of breath.  He has follow-up with cardiology tomorrow.  Plan: - Continue Lasix 80 mg twice a day - Continue metolazone weekly - Continue Farxiga 10 mg

## 2022-08-28 NOTE — Patient Instructions (Signed)
Thank you so much for coming to the clinic today!   I have ordered a CPAP machine for your use at home, they should be in contact with you soon. I have also sent in some iron pills. Please follow up with your heart doctor tomorrow. Glad to hear you're doing well!  If you have any questions please feel free to the call the clinic at anytime at [phone]. It was a pleasure seeing you!  Best, Dr. Thomasene Ripple

## 2022-08-29 ENCOUNTER — Ambulatory Visit: Payer: Medicaid Other | Admitting: Nurse Practitioner

## 2022-08-29 NOTE — Progress Notes (Deleted)
Office Visit    Patient Name: Frank Morales Date of Encounter: 08/29/2022  Primary Care Provider:  Olegario Messier, MD Primary Cardiologist:  Dietrich Pates, MD  Chief Complaint    62 year old male with a history of persistent atrial fibrillation/atrial flutter, chronic diastolic heart failure, hypertension, COPD on chronic home O2, OSA, and substance use who presents for follow-up related to atrial fibrillation and heart failure.  Past Medical History    Past Medical History:  Diagnosis Date   Acute exacerbation of CHF (congestive heart failure) (HCC) 12/15/2019   Acute heart failure (HCC) 12/16/2019   Atrial fibrillation and flutter (HCC) 08/17/2015   A. S/p failed DCCV // b. Severe BAE on echo >> rate control strategy (has seen AF clinic) // c. Xarelto for anticoag (CHADS2-VASc=2 / CHF, HTN)   Chronic diastolic CHF (congestive heart failure) (HCC) 08/30/2015   A. Echo 6/17: Apical HK, moderate focal basal and mild concentric LVH, EF 50-55, diffuse HK, trivial MR, severe BAE, mild TR, PASP 37   Dependence on continuous supplemental oxygen    3L   Hematuria 01/10/2021   Hepatic fibrosis 02/29/2016   F3/4 on elastography.    Hepatitis C, chronic (HCC) 08/19/2015   Hepatitis C, chronic (HCC) 08/19/2015   Treated in 2017 with SVR   History of cardiac catheterization    a. LHC 6/17: LAD irregs, o/w no CAD   Hypertension    Hyposmia 12/22/2017   OSA (obstructive sleep apnea) 11/21/2015   No Cpap   Pleural effusion on right 03/14/2018   Sleep apnea    Tobacco abuse    Past Surgical History:  Procedure Laterality Date   CARDIAC CATHETERIZATION N/A 08/21/2015   Procedure: Right/Left Heart Cath and Coronary Angiography;  Surgeon: Marykay Lex, MD;  Location: [facility] INVASIVE CV LAB;  Service: Cardiovascular;  Laterality: N/A;   CARDIOVERSION N/A 09/22/2015   Procedure: CARDIOVERSION;  Surgeon: Wendall Stade, MD;  Location: [facility] ENDOSCOPY;  Service: Cardiovascular;  Laterality: N/A;    COLONOSCOPY N/A 07/19/2016   Procedure: COLONOSCOPY;  Surgeon: Sherrilyn Rist, MD;  Location: [facility] ENDOSCOPY;  Service: Gastroenterology;  Laterality: N/A;   ESOPHAGOGASTRODUODENOSCOPY N/A 07/19/2016   Procedure: ESOPHAGOGASTRODUODENOSCOPY (EGD);  Surgeon: Sherrilyn Rist, MD;  Location: [facility] ENDOSCOPY;  Service: Gastroenterology;  Laterality: N/A;   IR THORACENTESIS ASP PLEURAL SPACE W/IMG GUIDE  07/09/2019   NO PAST SURGERIES     SINUS ENDO WITH FUSION N/A 10/02/2018   Procedure: SINUS ENDO WITH FUSION;  Surgeon: Suzanna Obey, MD;  Location: [facility] OR;  Service: ENT;  Laterality: N/A;    Allergies  Allergies  Allergen Reactions   Lactose Intolerance (Gi) Nausea And Vomiting     Labs/Other Studies Reviewed    The following studies were reviewed today:  Cardiac Studies & Procedures   CARDIAC CATHETERIZATION  CARDIAC CATHETERIZATION 08/21/2015  Narrative Images from the original result were not included.  Angiographically minimal coronary artery disease. Large draping vessels. Several very tortuous  Mildly elevated secondary pulmonary hypertension  Mildly elevated signal April medication for elevated LVEDP. Suggest more diastolic dysfunction  Plan:  [Narrative TRUNCATED]  Findings Coronary Findings Diagnostic  Dominance: Right  Left Main . Vessel is large. Vessel is angiographically normal.  Left Anterior Descending  Lateral First Diagonal Branch The vessel is moderate in size and exhibits minimal luminal irregularities.  First Septal Branch The vessel is moderate in size.  Second Diagonal Branch The vessel is small in size.  Second Septal Branch The vessel  is small in size.  Third Septal Branch The vessel is small in size.  Ramus Intermedius . Vessel is large.  Lateral Ramus Intermedius The vessel is small in size.  Left Circumflex  First Obtuse Marginal Branch The vessel is moderate in size and is angiographically normal.  Second Obtuse Marginal  Branch The vessel is angiographically normal.  Right Coronary Artery . Vessel is large.  Acute Marginal Branch The vessel is moderate in size and is angiographically normal. The vessel is tortuous.  Second Right Posterolateral Branch The vessel is small in size.  Intervention  No interventions have been documented.     ECHOCARDIOGRAM  ECHOCARDIOGRAM COMPLETE 08/08/2022  Narrative ECHOCARDIOGRAM REPORT    Patient Name:   Frank Morales Date of Exam: 08/08/2022 Medical Rec #:  [id number]         Height:       71.0 in Accession #:    [id number]        Weight:       316.8 lb Date of Birth:  December 08, 1960         BSA:          2.564 m Patient Age:    61 years          BP:           117/60 mmHg Patient Gender: M                 HR:           93 bpm. Exam Location:  Outpatient  Procedure: 2D Echo, Color Doppler and Cardiac Doppler  Indications:    I50.32 Diastolic Heart Failure  History:        Patient has prior history of Echocardiogram examinations. Arrythmias:Atrial Fibrillation; Risk Factors:Current Smoker, Sleep Apnea, Hypertension and Morbid Obesity.  Sonographer:    L. Thornton-Maynard Referring Phys: [id number] GRACE LAU   Sonographer Comments: Image acquisition challenging due to patient body habitus. IMPRESSIONS   1. Left ventricular ejection fraction, by estimation, is 55 to 60%. The left ventricle has normal function. The left ventricle has no regional wall motion abnormalities. There is moderate concentric left ventricular hypertrophy. Left ventricular diastolic function could not be evaluated. 2. Right ventricular systolic function is normal. The right ventricular size is normal. There is mildly elevated pulmonary artery systolic pressure. 3. Left atrial size was moderately dilated. 4. Right atrial size was mildly dilated. 5. The mitral valve is normal in structure. Trivial mitral valve regurgitation. No evidence of mitral stenosis. 6. The aortic valve is  grossly normal. Aortic valve regurgitation is not visualized. No aortic stenosis is present. 7. Aortic dilatation noted. There is mild dilatation of the ascending aorta, measuring 43 mm. 8. The inferior vena cava is normal in size with greater than 50% respiratory variability, suggesting right atrial pressure of 3 mmHg.  Comparison(s): No significant change from prior study.  FINDINGS Left Ventricle: Left ventricular ejection fraction, by estimation, is 55 to 60%. The left ventricle has normal function. The left ventricle has no regional wall motion abnormalities. The left ventricular internal cavity size was normal in size. There is moderate concentric left ventricular hypertrophy. Left ventricular diastolic function could not be evaluated due to atrial fibrillation. Left ventricular diastolic function could not be evaluated.  Right Ventricle: The right ventricular size is normal. Right vetricular wall thickness was not well visualized. Right ventricular systolic function is normal. There is mildly elevated pulmonary artery systolic pressure. The tricuspid regurgitant velocity is 2.88 m/s, and  with an assumed right atrial pressure of 3 mmHg, the estimated right ventricular systolic pressure is 36.2 mmHg.  Left Atrium: Left atrial size was moderately dilated.  Right Atrium: Right atrial size was mildly dilated.  Pericardium: There is no evidence of pericardial effusion. Presence of epicardial fat layer.  Mitral Valve: The mitral valve is normal in structure. Mild mitral annular calcification. Trivial mitral valve regurgitation. No evidence of mitral valve stenosis. MV peak gradient, 2.8 mmHg. The mean mitral valve gradient is 2.0 mmHg.  Tricuspid Valve: The tricuspid valve is not well visualized. Tricuspid valve regurgitation is trivial. No evidence of tricuspid stenosis.  Aortic Valve: The aortic valve is grossly normal. Aortic valve regurgitation is not visualized. No aortic stenosis is  present. Aortic valve mean gradient measures 4.3 mmHg. Aortic valve peak gradient measures 8.2 mmHg. Aortic valve area, by VTI measures 3.94 cm.  Pulmonic Valve: The pulmonic valve was not well visualized. Pulmonic valve regurgitation is not visualized.  Aorta: Aortic dilatation noted. There is mild dilatation of the ascending aorta, measuring 43 mm.  Venous: The inferior vena cava is normal in size with greater than 50% respiratory variability, suggesting right atrial pressure of 3 mmHg.  IAS/Shunts: The atrial septum is grossly normal.   LEFT VENTRICLE PLAX 2D LVIDd:         5.60 cm      Diastology LVIDs:         3.90 cm      LV e' medial:    9.68 cm/s LV PW:         1.50 cm      LV E/e' medial:  9.4 LV IVS:        1.60 cm      LV e' lateral:   8.92 cm/s LVOT diam:     2.80 cm      LV E/e' lateral: 10.2 LV SV:         85 LV SV Index:   33 LVOT Area:     6.16 cm  LV Volumes (MOD) LV vol d, MOD A2C: 72.5 ml LV vol d, MOD A4C: 126.0 ml LV vol s, MOD A2C: 47.0 ml LV vol s, MOD A4C: 27.9 ml LV SV MOD A2C:     25.5 ml LV SV MOD A4C:     126.0 ml LV SV MOD BP:      61.4 ml  RIGHT VENTRICLE             IVC RV S prime:     14.00 cm/s  IVC diam: 1.30 cm  LEFT ATRIUM              Index LA diam:        5.00 cm  1.95 cm/m LA Vol (A2C):   95.2 ml  37.13 ml/m LA Vol (A4C):   114.0 ml 44.47 ml/m LA Biplane Vol: 106.0 ml 41.35 ml/m AORTIC VALVE                    PULMONIC VALVE AV Area (Vmax):    3.72 cm     PV Vmax:       0.92 m/s AV Area (Vmean):   3.78 cm     PV Peak grad:  3.4 mmHg AV Area (VTI):     3.94 cm AV Vmax:           143.33 cm/s AV Vmean:          95.500 cm/s AV VTI:  0.215 m AV Peak Grad:      8.2 mmHg AV Mean Grad:      4.3 mmHg LVOT Vmax:         86.60 cm/s LVOT Vmean:        58.650 cm/s LVOT VTI:          0.138 m LVOT/AV VTI ratio: 0.64  AORTA Ao Root diam: 3.70 cm Ao Asc diam:  4.30 cm  MITRAL VALVE               TRICUSPID VALVE MV  Area (PHT): 5.21 cm    TR Peak grad:   33.2 mmHg MV Area VTI:   5.52 cm    TR Vmax:        288.00 cm/s MV Peak grad:  2.8 mmHg MV Mean grad:  2.0 mmHg    SHUNTS MV Vmax:       0.83 m/s    Systemic VTI:  0.14 m MV Vmean:      60.2 cm/s   Systemic Diam: 2.80 cm MV Decel Time: 146 msec MV E velocity: 91.40 cm/s  Jodelle Red MD Electronically signed by Jodelle Red MD Signature Date/Time: 08/08/2022/4:03:41 PM    Final            Recent Labs: 09/09/2021: ALT 38 12/20/2021: B Natriuretic Peptide 175.0; Magnesium 2.2 07/25/2022: Hemoglobin 11.1; Platelets 307 08/01/2022: BUN 14; Creatinine, Ser 0.88; Potassium 3.7; Sodium 138  Recent Lipid Panel    Component Value Date/Time   CHOL 176 08/07/2021 1647   TRIG 219 (H) 08/07/2021 1647   HDL 37 (L) 08/07/2021 1647   CHOLHDL 4.8 08/07/2021 1647   CHOLHDL 3.9 12/16/2019 0807   VLDL 15 12/16/2019 0807   LDLCALC 101 (H) 08/07/2021 1647    History of Present Illness    62 year old male with the above past medical history including persistent atrial fibrillation/atrial flutter, chronic diastolic heart failure, hypertension, COPD on chronic home O2, OSA, and substance use.  He was hospitalized in June 2017 in the setting of atrial flutter, acute diastolic heart failure.  He was started on Xarelto and underwent DCCV in July 2017.  He has had several hospitalizations for acute on chronic diastolic heart failure.  Cardiac catheterization in June 2021 showed no evidence of CAD.  Echocardiogram at that time showed EF 50 to 55%.  He was last seen in the office on 08/07/2021 and was stable overall from a cardiac standpoint.  His fluid volume status was stable on metolazone 3 times weekly.  His potassium was low at 2.8 and this was supplemented.  Metolazone was decreased to once weekly dosing.  He presents today for follow-up.  Since his last visit  Chronic diastolic heart failure: Persistent atrial fibrillation/atrial  flutter: Hypertension: COPD: OSA: Disposition:  Home Medications    Current Outpatient Medications  Medication Sig Dispense Refill   albuterol (VENTOLIN HFA) 108 (90 Base) MCG/ACT inhaler Inhale 2 puffs into the lungs every 6 (six) hours as needed for wheezing or shortness of breath. 8 g 2   ANORO ELLIPTA 62.5-25 MCG/ACT AEPB INHALE 1 PUFF INTO THE LUNGS DAILY 60 each 2   azelastine (ASTELIN) 0.1 % nasal spray USE 2 SPRAYS IN EACH NOSTRIL TWICE DAILY 90 mL 1   dapagliflozin propanediol (FARXIGA) 10 MG TABS tablet TAKE 1 TABLET(10 MG) BY MOUTH DAILY BEFORE BREAKFAST 90 tablet 3   diltiazem (CARDIZEM CD) 240 MG 24 hr capsule TAKE 2 CAPSULES(480 MG) BY MOUTH DAILY 180 capsule 3   ferrous  sulfate 325 (65 FE) MG tablet Take 1 tablet (325 mg total) by mouth every Monday, Wednesday, and Friday. 30 tablet 3   fluticasone (FLONASE) 50 MCG/ACT nasal spray Place 1 spray into both nostrils daily. 16 g 2   furosemide (LASIX) 80 MG tablet TAKE 1 TABLET(80 MG) BY MOUTH TWICE DAILY 180 tablet 3   loratadine (CLARITIN) 10 MG tablet TAKE 1 TABLET(10 MG) BY MOUTH DAILY 90 tablet 3   metolazone (ZAROXOLYN) 2.5 MG tablet Take 1 tablet (2.5 mg total) by mouth once a week. Take every Wednesday. 30 tablet 0   pantoprazole (PROTONIX) 20 MG tablet Take 1 tablet (20 mg total) by mouth daily. 90 tablet 3   potassium chloride SA (KLOR-CON M) 20 MEQ tablet Take 2 tablets (40 mEq total) by mouth 2 (two) times daily. 120 tablet 2   predniSONE (DELTASONE) 20 MG tablet Take 2 tablets (40 mg total) by mouth daily with breakfast. 10 tablet 0   XARELTO 20 MG TABS tablet TAKE 1 TABLET(20 MG) BY MOUTH DAILY WITH SUPPER 90 tablet 2   No current facility-administered medications for this visit.     Review of Systems    ***.  All other systems reviewed and are otherwise negative except as noted above.    Physical Exam    VS:  There were no vitals taken for this visit. , BMI There is no height or weight on file to calculate  BMI.     GEN: Well nourished, well developed, in no acute distress. HEENT: normal. Neck: Supple, no JVD, carotid bruits, or masses. Cardiac: RRR, no murmurs, rubs, or gallops. No clubbing, cyanosis, edema.  Radials/DP/PT 2+ and equal bilaterally.  Respiratory:  Respirations regular and unlabored, clear to auscultation bilaterally. GI: Soft, nontender, nondistended, BS + x 4. MS: no deformity or atrophy. Skin: warm and dry, no rash. Neuro:  Strength and sensation are intact. Psych: Normal affect.  Accessory Clinical Findings    ECG personally reviewed by me today - *** - no acute changes.   Lab Results  Component Value Date   WBC 7.0 07/25/2022   HGB 11.1 (L) 07/25/2022   HCT 35.7 (L) 07/25/2022   MCV 79 07/25/2022   PLT 307 07/25/2022   Lab Results  Component Value Date   CREATININE 0.88 08/01/2022   BUN 14 08/01/2022   NA 138 08/01/2022   K 3.7 08/01/2022   CL 92 (L) 08/01/2022   CO2 30 (H) 08/01/2022   Lab Results  Component Value Date   ALT 38 09/09/2021   AST 68 (H) 09/09/2021   ALKPHOS 40 09/09/2021   BILITOT 0.9 09/09/2021   Lab Results  Component Value Date   CHOL 176 08/07/2021   HDL 37 (L) 08/07/2021   LDLCALC 101 (H) 08/07/2021   TRIG 219 (H) 08/07/2021   CHOLHDL 4.8 08/07/2021    Lab Results  Component Value Date   HGBA1C 5.6 12/04/2021    Assessment & Plan    1.  ***      Joylene Grapes, NP 08/29/2022, 6:12 AM

## 2022-09-06 NOTE — Progress Notes (Signed)
Internal Medicine Clinic Attending  Case discussed with Dr. Nooruddin  At the time of the visit.  We reviewed the resident's history and exam and pertinent patient test results.  I agree with the assessment, diagnosis, and plan of care documented in the resident's note.  

## 2022-09-07 DIAGNOSIS — I509 Heart failure, unspecified: Secondary | ICD-10-CM | POA: Diagnosis not present

## 2022-09-07 DIAGNOSIS — I5032 Chronic diastolic (congestive) heart failure: Secondary | ICD-10-CM | POA: Diagnosis not present

## 2022-09-13 ENCOUNTER — Ambulatory Visit (INDEPENDENT_AMBULATORY_CARE_PROVIDER_SITE_OTHER): Payer: Medicaid Other | Admitting: Allergy

## 2022-09-13 ENCOUNTER — Encounter: Payer: Self-pay | Admitting: Allergy

## 2022-09-13 ENCOUNTER — Other Ambulatory Visit: Payer: Self-pay

## 2022-09-13 VITALS — BP 110/70 | HR 84 | Temp 98.0°F | Ht 71.0 in | Wt 316.5 lb

## 2022-09-13 DIAGNOSIS — T781XXD Other adverse food reactions, not elsewhere classified, subsequent encounter: Secondary | ICD-10-CM

## 2022-09-13 DIAGNOSIS — J310 Chronic rhinitis: Secondary | ICD-10-CM | POA: Diagnosis not present

## 2022-09-13 MED ORDER — LEVOCETIRIZINE DIHYDROCHLORIDE 5 MG PO TABS
5.0000 mg | ORAL_TABLET | Freq: Every evening | ORAL | 4 refills | Status: DC
Start: 2022-09-13 — End: 2022-12-12

## 2022-09-13 MED ORDER — TRIAMCINOLONE ACETONIDE 55 MCG/ACT NA AERO: INHALATION_SPRAY | NASAL | 4 refills | Status: DC

## 2022-09-13 NOTE — Progress Notes (Signed)
New Patient Note  RE: Frank Morales MRN: [id number] DOB: [birth year] Date of Office Visit: 09/13/2022  Primary care provider: Olegario Messier, MD  Chief Complaint: allergies  History of present illness: Frank Morales is a 62 y.o. male presenting today for evaluation of allergic rhinitis.   He reports symptoms of chronic congestion (he states he has to sleep upright as he can't breathe well through the nose), throat clearing, occasional sneezing.  Does not have itchy/watery eyes often. Not prone to sinus infections.  He has used flonase 1 spray twice a day for past year and not working well.   He is using as well Azelastine 2 sprays daily.  He takes Claritin daily and has been using for more than a year without much benefit. He does have a device to perform nasal steam treatments but only has used it like once or twice; he got it in the winter.  He avoids dairy products as it 'messes me up' and he gets sick and develops wheezing.  No history of eczema.   He has history of COPD as well as heart failure (diastolic CHF).  He follows with cardiology.  He is oxygen dependent ~2-3L at this time.   Review of systems: Review of Systems  Constitutional: Negative.   HENT:  Positive for congestion.   Eyes: Negative.   Respiratory: Negative.    Cardiovascular: Negative.   Musculoskeletal: Negative.   Skin: Negative.   Allergic/Immunologic: Negative.   Neurological: Negative.     All other systems negative unless noted above in HPI  Past medical history: Past Medical History:  Diagnosis Date   Acute exacerbation of CHF (congestive heart failure) (HCC) 12/15/2019   Acute heart failure (HCC) 12/16/2019   Angio-edema    Atrial fibrillation and flutter (HCC) 08/17/2015   A. S/p failed DCCV // b. Severe BAE on echo >> rate control strategy (has seen AF clinic) // c. Xarelto for anticoag (CHADS2-VASc=2 / CHF, HTN)   Chronic diastolic CHF (congestive heart failure) (HCC) 08/30/2015    A. Echo 6/17: Apical HK, moderate focal basal and mild concentric LVH, EF 50-55, diffuse HK, trivial MR, severe BAE, mild TR, PASP 37   Dependence on continuous supplemental oxygen    3L   Hematuria 01/10/2021   Hepatic fibrosis 02/29/2016   F3/4 on elastography.    Hepatitis C, chronic (HCC) 08/19/2015   Hepatitis C, chronic (HCC) 08/19/2015   Treated in 2017 with SVR   History of cardiac catheterization    a. LHC 6/17: LAD irregs, o/w no CAD   Hypertension    Hyposmia 12/22/2017   OSA (obstructive sleep apnea) 11/21/2015   No Cpap   Pleural effusion on right 03/14/2018   Sleep apnea    Tobacco abuse     Past surgical history: Past Surgical History:  Procedure Laterality Date   CARDIAC CATHETERIZATION N/A 08/21/2015   Procedure: Right/Left Heart Cath and Coronary Angiography;  Surgeon: Marykay Lex, MD;  Location: [facility] INVASIVE CV LAB;  Service: Cardiovascular;  Laterality: N/A;   CARDIOVERSION N/A 09/22/2015   Procedure: CARDIOVERSION;  Surgeon: Wendall Stade, MD;  Location: [facility] ENDOSCOPY;  Service: Cardiovascular;  Laterality: N/A;   COLONOSCOPY N/A 07/19/2016   Procedure: COLONOSCOPY;  Surgeon: Sherrilyn Rist, MD;  Location: [facility] ENDOSCOPY;  Service: Gastroenterology;  Laterality: N/A;   ESOPHAGOGASTRODUODENOSCOPY N/A 07/19/2016   Procedure: ESOPHAGOGASTRODUODENOSCOPY (EGD);  Surgeon: Sherrilyn Rist, MD;  Location: [facility] ENDOSCOPY;  Service: Gastroenterology;  Laterality: N/A;  IR THORACENTESIS ASP PLEURAL SPACE W/IMG GUIDE  07/09/2019   NO PAST SURGERIES     SINUS ENDO WITH FUSION N/A 10/02/2018   Procedure: SINUS ENDO WITH FUSION;  Surgeon: Suzanna Obey, MD;  Location: [facility] OR;  Service: ENT;  Laterality: N/A;    Family history:  Family History  Problem Relation Age of Onset   Asthma Mother    Allergic rhinitis Mother    Hypertension Mother    Diabetes Mother    Pneumonia Father    Hypertension Maternal Grandfather    Colon cancer Neg Hx     Social history: Lives in  a home with carpeting with electric heating and central cooling.  No pets in the home.  No concern for roaches in the home.  Concern for mildew in the home.  He does not work.   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 8 g 2   ANORO ELLIPTA 62.5-25 MCG/ACT AEPB INHALE 1 PUFF INTO THE LUNGS DAILY 60 each 2   azelastine (ASTELIN) 0.1 % nasal spray USE 2 SPRAYS IN EACH NOSTRIL TWICE DAILY 90 mL 1   dapagliflozin propanediol (FARXIGA) 10 MG TABS tablet TAKE 1 TABLET(10 MG) BY MOUTH DAILY BEFORE BREAKFAST 90 tablet 3   diltiazem (CARDIZEM CD) 240 MG 24 hr capsule TAKE 2 CAPSULES(480 MG) BY MOUTH DAILY 180 capsule 3   ferrous sulfate 325 (65 FE) MG tablet Take 1 tablet (325 mg total) by mouth every Monday, Wednesday, and Friday. 30 tablet 3   fluticasone (FLONASE) 50 MCG/ACT nasal spray Place 1 spray into both nostrils daily. 16 g 2   furosemide (LASIX) 80 MG tablet TAKE 1 TABLET(80 MG) BY MOUTH TWICE DAILY 180 tablet 3   loratadine (CLARITIN) 10 MG tablet TAKE 1 TABLET(10 MG) BY MOUTH DAILY 90 tablet 3   metolazone (ZAROXOLYN) 2.5 MG tablet Take 1 tablet (2.5 mg total) by mouth once a week. Take every Wednesday. 30 tablet 0   pantoprazole (PROTONIX) 20 MG tablet Take 1 tablet (20 mg total) by mouth daily. 90 tablet 3   potassium chloride SA (KLOR-CON M) 20 MEQ tablet Take 2 tablets (40 mEq total) by mouth 2 (two) times daily. 120 tablet 2   predniSONE (DELTASONE) 20 MG tablet Take 2 tablets (40 mg total) by mouth daily with breakfast. 10 tablet 0    XARELTO 20 MG TABS tablet TAKE 1 TABLET(20 MG) BY MOUTH DAILY WITH SUPPER 90 tablet 2   No current facility-administered medications for this visit.    Known medication allergies: Allergies  Allergen Reactions   Lactose Intolerance (Gi) Nausea And Vomiting     Physical examination: Blood pressure 110/70, pulse 84, temperature 98 F (36.7 C), height 5\' 11"  (1.803 m), weight (!) 316 lb 8 oz (143.6 kg), SpO2 95 %.  General: Alert, interactive, in no acute distress, wearing nasal cannula. HEENT: PERRLA, TMs pearly gray, turbinates moderately edematous without discharge, post-pharynx unremarkable. Neck: Supple without lymphadenopathy. Lungs: Clear to auscultation  without wheezing, rhonchi or rales. {no increased work of breathing. CV: Normal S1, S2 without murmurs. Abdomen: Nondistended, nontender. Skin: Warm and dry, without lesions or rashes. Extremities:  No clubbing, cyanosis or edema. Neuro:   Grossly intact.  Diagnositics/Labs: None today   Assessment and plan:   Chronic rhinitis - will obtain environmental allergy panel via bloodwork - stop Claritin as does not seem effective - start Xyzal 5mg  daily.  This is an antihistamine to replace Claritin - stop Flonase and Azelastine as not very effective - start Nasacort 2 sprays each nostril daily for 1-2 weeks at a time before stopping once nasal congestion improves for maximum benefit - perform nasal steam treatments at night and then follow-up with nasal spray.   Adverse food reaction - continue to avoid dairy in the diet - will obtain milk IgE to see if you have a food allergy vs intolerance If testing is positive then would recommend you have access to an epinephrine device in case of allergic reaction.    Allergy: food allergy is when you have eaten a food, developed an allergic reaction after eating the food and have IgE to the food (positive food testing either by skin testing or blood testing).  Food allergy could lead  to life threatening symptoms  Sensitivity: occurs when you have IgE to a food (positive food testing either by skin testing or blood testing) but is a food you eat without any issues.  This is not an allergy and we recommend keeping the food in the diet  Intolerance: this is when you have negative testing by either skin testing or blood testing thus not allergic but the food causes symptoms (like belly pain, bloating, diarrhea etc) with ingestion.  These foods should be avoided to prevent symptoms.    Follow-up in 3-4 months or sooner if needed   I appreciate the opportunity to take part in Frank Morales's care. Please do not hesitate to contact me with questions.  Sincerely,   Margo Aye, MD Allergy/Immunology [facility]

## 2022-09-13 NOTE — Patient Instructions (Addendum)
Chronic rhinitis - will obtain environmental allergy panel via bloodwork - stop Claritin as does not seem effective - start Xyzal 5mg  daily.  This is an antihistamine to replace Claritin - stop Flonase and Azelastine as not very effective - start Nasacort 2 sprays each nostril daily for 1-2 weeks at a time before stopping once nasal congestion improves for maximum benefit - perform nasal steam treatments at night and then follow-up with nasal spray.   Adverse food reaction - continue to avoid dairy in the diet - will obtain milk IgE to see if you have a food allergy vs intolerance If testing is positive then would recommend you have access to an epinephrine device in case of allergic reaction.    Allergy: food allergy is when you have eaten a food, developed an allergic reaction after eating the food and have IgE to the food (positive food testing either by skin testing or blood testing).  Food allergy could lead to life threatening symptoms  Sensitivity: occurs when you have IgE to a food (positive food testing either by skin testing or blood testing) but is a food you eat without any issues.  This is not an allergy and we recommend keeping the food in the diet  Intolerance: this is when you have negative testing by either skin testing or blood testing thus not allergic but the food causes symptoms (like belly pain, bloating, diarrhea etc) with ingestion.  These foods should be avoided to prevent symptoms.     Follow-up in 3-4 months or sooner if needed

## 2022-09-18 LAB — ALLERGENS W/TOTAL IGE AREA 2

## 2022-09-18 LAB — ALLERGEN MILK: Milk IgE: <0.1 kU/L

## 2022-09-19 LAB — ALLERGENS W/TOTAL IGE AREA 2
Aspergillus Fumigatus IgE: <0.1 kU/L
Bermuda Grass IgE: 0.3 kU/L — AB
Cedar, Mountain IgE: 0.31 kU/L — AB
Cladosporium Herbarum IgE: <0.1 kU/L
Cottonwood IgE: 0.28 kU/L — AB
D Farinae IgE: 0.18 kU/L — AB
Dog Dander IgE: <0.1 kU/L
IgE (Immunoglobulin E), Serum: 740 IU/mL — ABNORMAL HIGH (ref 6–495)
Johnson Grass IgE: 0.27 kU/L — AB
Maple/Box Elder IgE: 0.27 kU/L — AB
Mouse Urine IgE: <0.1 kU/L
Oak, White IgE: 0.24 kU/L — AB
Pecan, Hickory IgE: 0.28 kU/L — AB
Penicillium Chrysogen IgE: <0.1 kU/L
Pigweed, Rough IgE: 0.24 kU/L — AB
Ragweed, Short IgE: 0.29 kU/L — AB
Timothy Grass IgE: 0.25 kU/L — AB

## 2022-09-23 NOTE — Progress Notes (Deleted)
Cardiology Clinic Note   Patient Name: Frank Morales Date of Encounter: 09/23/2022  Primary Care Provider:  Olegario Messier, MD Primary Cardiologist:  Dietrich Pates, MD  Patient Profile    62 year old male with a history of diastolic CHF, OSA, persistant atrial fibrillation/flutter, COPD (on chronic O2) with continued tobacco abuse, HTN and substance abuse He underwent Cardiac cath in June 2021 which showed no significant CAD Echo showed LVEF was 50 to 55% He was placed on Xarelto and underwent DCCV in July 2017 Echo in April 2021 LVEF 55 to 60% He has had several admits for CHF, last in September 2021   Past Medical History    Past Medical History:  Diagnosis Date   Acute exacerbation of CHF (congestive heart failure) (HCC) 12/15/2019   Acute heart failure (HCC) 12/16/2019   Angio-edema    Atrial fibrillation and flutter (HCC) 08/17/2015   A. S/p failed DCCV // b. Severe BAE on echo >> rate control strategy (has seen AF clinic) // c. Xarelto for anticoag (CHADS2-VASc=2 / CHF, HTN)   Chronic diastolic CHF (congestive heart failure) (HCC) 08/30/2015   A. Echo 6/17: Apical HK, moderate focal basal and mild concentric LVH, EF 50-55, diffuse HK, trivial MR, severe BAE, mild TR, PASP 37   Dependence on continuous supplemental oxygen    3L   Hematuria 01/10/2021   Hepatic fibrosis 02/29/2016   F3/4 on elastography.    Hepatitis C, chronic (HCC) 08/19/2015   Hepatitis C, chronic (HCC) 08/19/2015   Treated in 2017 with SVR   History of cardiac catheterization    a. LHC 6/17: LAD irregs, o/w no CAD   Hypertension    Hyposmia 12/22/2017   OSA (obstructive sleep apnea) 11/21/2015   No Cpap   Pleural effusion on right 03/14/2018   Sleep apnea    Tobacco abuse    Past Surgical History:  Procedure Laterality Date   CARDIAC CATHETERIZATION N/A 08/21/2015   Procedure: Right/Left Heart Cath and Coronary Angiography;  Surgeon: Marykay Lex, MD;  Location: [facility] INVASIVE CV LAB;  Service:  Cardiovascular;  Laterality: N/A;   CARDIOVERSION N/A 09/22/2015   Procedure: CARDIOVERSION;  Surgeon: Wendall Stade, MD;  Location: [facility] ENDOSCOPY;  Service: Cardiovascular;  Laterality: N/A;   COLONOSCOPY N/A 07/19/2016   Procedure: COLONOSCOPY;  Surgeon: Sherrilyn Rist, MD;  Location: [facility] ENDOSCOPY;  Service: Gastroenterology;  Laterality: N/A;   ESOPHAGOGASTRODUODENOSCOPY N/A 07/19/2016   Procedure: ESOPHAGOGASTRODUODENOSCOPY (EGD);  Surgeon: Sherrilyn Rist, MD;  Location: [facility] ENDOSCOPY;  Service: Gastroenterology;  Laterality: N/A;   IR THORACENTESIS ASP PLEURAL SPACE W/IMG GUIDE  07/09/2019   NO PAST SURGERIES     SINUS ENDO WITH FUSION N/A 10/02/2018   Procedure: SINUS ENDO WITH FUSION;  Surgeon: Suzanna Obey, MD;  Location: [facility] OR;  Service: ENT;  Laterality: N/A;    Allergies  Allergies  Allergen Reactions   Lactose Intolerance (Gi) Nausea And Vomiting    History of Present Illness    ***  Home Medications    Current Outpatient Medications  Medication Sig Dispense Refill   albuterol (VENTOLIN HFA) 108 (90 Base) MCG/ACT inhaler Inhale 2 puffs into the lungs every 6 (six) hours as needed for wheezing or shortness of breath. 8 g 2   ANORO ELLIPTA 62.5-25 MCG/ACT AEPB INHALE 1 PUFF INTO THE LUNGS DAILY 60 each 2   azelastine (ASTELIN) 0.1 % nasal spray USE 2 SPRAYS IN EACH NOSTRIL TWICE DAILY 90 mL 1   dapagliflozin propanediol (  FARXIGA) 10 MG TABS tablet TAKE 1 TABLET(10 MG) BY MOUTH DAILY BEFORE BREAKFAST 90 tablet 3   diltiazem (CARDIZEM CD) 240 MG 24 hr capsule TAKE 2 CAPSULES(480 MG) BY MOUTH DAILY 180 capsule 3   ferrous sulfate 325 (65 FE) MG tablet Take 1 tablet (325 mg total) by mouth every Monday, Wednesday, and Friday. 30 tablet 3   furosemide (LASIX) 80 MG tablet TAKE 1 TABLET(80 MG) BY MOUTH TWICE DAILY 180 tablet 3   levocetirizine (XYZAL) 5 MG tablet Take 1 tablet (5 mg total) by mouth every evening. 30 tablet 4   metolazone (ZAROXOLYN) 2.5 MG tablet Take 1 tablet  (2.5 mg total) by mouth once a week. Take every Wednesday. 30 tablet 0   pantoprazole (PROTONIX) 20 MG tablet Take 1 tablet (20 mg total) by mouth daily. 90 tablet 3   potassium chloride SA (KLOR-CON M) 20 MEQ tablet Take 2 tablets (40 mEq total) by mouth 2 (two) times daily. 120 tablet 2   predniSONE (DELTASONE) 20 MG tablet Take 2 tablets (40 mg total) by mouth daily with breakfast. 10 tablet 0   triamcinolone (NASACORT) 55 MCG/ACT AERO nasal inhaler 2 sprays each nostril daily for 1-2 weeks at a time before stopping once nasal congestion improves for best benefit. 16.9 mL 4   XARELTO 20 MG TABS tablet TAKE 1 TABLET(20 MG) BY MOUTH DAILY WITH SUPPER 90 tablet 2   No current facility-administered medications for this visit.     Family History    Family History  Problem Relation Age of Onset   Asthma Mother    Allergic rhinitis Mother    Hypertension Mother    Diabetes Mother    Pneumonia Father    Hypertension Maternal Grandfather    Colon cancer Neg Hx    He indicated that his mother is deceased. He indicated that his father is deceased. He indicated that his maternal grandmother is deceased. He indicated that his maternal grandfather is deceased. He indicated that his paternal grandmother is deceased. He indicated that his paternal grandfather is deceased. He indicated that the status of his neg hx is unknown.  Social History    Social History   Socioeconomic History   Marital status: Single    Spouse name: Not on file   Number of children: Not on file   Years of education: Not on file   Highest education level: Not on file  Occupational History   Occupation: unemployed    Comment: Previous cook   Tobacco Use   Smoking status: Every Day    Packs/day: .5    Types: Cigarettes    Passive exposure: Current   Smokeless tobacco: Never   Tobacco comments:    5 per day  Vaping Use   Vaping Use: Never used  Substance and Sexual Activity   Alcohol use: Yes    Alcohol/week:  5.0 standard drinks of alcohol    Types: 5 Standard drinks or equivalent per week    Comment: 2 times a week beer   Drug use: No   Sexual activity: Not on file  Other Topics Concern   Not on file  Social History Narrative   ** Merged History Encounter **       Social Determinants of Health   Financial Resource Strain: Not on file  Food Insecurity: No Food Insecurity (04/16/2022)   Hunger Vital Sign    Worried About Running Out of Food in the Last Year: Never true    Ran Out of  Food in the Last Year: Never true  Transportation Needs: Unmet Transportation Needs (04/16/2022)   PRAPARE - Administrator, Civil Service (Medical): Yes    Lack of Transportation (Non-Medical): Yes  Physical Activity: Not on file  Stress: Not on file  Social Connections: Socially Isolated (04/16/2022)   Social Connection and Isolation Panel [NHANES]    Frequency of Communication with Friends and Family: More than three times a week    Frequency of Social Gatherings with Friends and Family: Twice a week    Attends Religious Services: Never    Database administrator or Organizations: No    Attends Banker Meetings: Never    Marital Status: Never married  Intimate Partner Violence: Not At Risk (04/16/2022)   Humiliation, Afraid, Rape, and Kick questionnaire    Fear of Current or Ex-Partner: No    Emotionally Abused: No    Physically Abused: No    Sexually Abused: No     Review of Systems    General:  No chills, fever, night sweats or weight changes.  Cardiovascular:  No chest pain, dyspnea on exertion, edema, orthopnea, palpitations, paroxysmal nocturnal dyspnea. Dermatological: No rash, lesions/masses Respiratory: No cough, dyspnea Urologic: No hematuria, dysuria Abdominal:   No nausea, vomiting, diarrhea, bright red blood per rectum, melena, or hematemesis Neurologic:  No visual changes, wkns, changes in mental status. All other systems reviewed and are otherwise negative  except as noted above.       Physical Exam    VS:  There were no vitals taken for this visit. , BMI There is no height or weight on file to calculate BMI.     GEN: Well nourished, well developed, in no acute distress. HEENT: normal. Neck: Supple, no JVD, carotid bruits, or masses. Cardiac: RRR, no murmurs, rubs, or gallops. No clubbing, cyanosis, edema.  Radials/DP/PT 2+ and equal bilaterally.  Respiratory:  Respirations regular and unlabored, clear to auscultation bilaterally. GI: Soft, nontender, nondistended, BS + x 4. MS: no deformity or atrophy. Skin: warm and dry, no rash. Neuro:  Strength and sensation are intact. Psych: Normal affect.      Lab Results  Component Value Date   WBC 7.0 07/25/2022   HGB 11.1 (L) 07/25/2022   HCT 35.7 (L) 07/25/2022   MCV 79 07/25/2022   PLT 307 07/25/2022   Lab Results  Component Value Date   CREATININE 0.88 08/01/2022   BUN 14 08/01/2022   NA 138 08/01/2022   K 3.7 08/01/2022   CL 92 (L) 08/01/2022   CO2 30 (H) 08/01/2022   Lab Results  Component Value Date   ALT 38 09/09/2021   AST 68 (H) 09/09/2021   ALKPHOS 40 09/09/2021   BILITOT 0.9 09/09/2021   Lab Results  Component Value Date   CHOL 176 08/07/2021   HDL 37 (L) 08/07/2021   LDLCALC 101 (H) 08/07/2021   TRIG 219 (H) 08/07/2021   CHOLHDL 4.8 08/07/2021    Lab Results  Component Value Date   HGBA1C 5.6 12/04/2021     Review of Prior Studies      Assessment & Plan   1.  ***     {Are you ordering a CV Procedure (e.g. stress test, cath, DCCV, TEE, etc)?   Press F2        :[id number]}   Signed, Bettey Mare. Liborio Nixon, ANP, AACC   09/23/2022 8:00 AM      Office [phone] Fax [phone]  Notice: This dictation  was prepared with Dragon dictation along with smaller phrase technology. Any transcriptional errors that result from this process are unintentional and may not be corrected upon review.

## 2022-09-27 ENCOUNTER — Ambulatory Visit: Payer: Medicaid Other | Admitting: Adult Health

## 2022-10-01 ENCOUNTER — Encounter (HOSPITAL_COMMUNITY): Payer: Self-pay | Admitting: Emergency Medicine

## 2022-10-01 ENCOUNTER — Inpatient Hospital Stay (HOSPITAL_COMMUNITY): Payer: Medicaid Other

## 2022-10-01 ENCOUNTER — Inpatient Hospital Stay (HOSPITAL_COMMUNITY)
Admission: EM | Admit: 2022-10-01 | Discharge: 2022-10-06 | DRG: 853 | Disposition: A | Discharge status: Home Health | Payer: Medicaid Other | Attending: Internal Medicine | Admitting: Internal Medicine

## 2022-10-01 ENCOUNTER — Emergency Department (HOSPITAL_COMMUNITY): Payer: Medicaid Other

## 2022-10-01 ENCOUNTER — Encounter (HOSPITAL_COMMUNITY)
Admission: EM | Disposition: A | Discharge status: Home Health | Payer: Self-pay | Source: Home / Self Care | Attending: Internal Medicine

## 2022-10-01 DIAGNOSIS — M351 Other overlap syndromes: Secondary | ICD-10-CM | POA: Diagnosis present

## 2022-10-01 DIAGNOSIS — E876 Hypokalemia: Secondary | ICD-10-CM | POA: Diagnosis not present

## 2022-10-01 DIAGNOSIS — R0602 Shortness of breath: Secondary | ICD-10-CM | POA: Diagnosis not present

## 2022-10-01 DIAGNOSIS — I4892 Unspecified atrial flutter: Secondary | ICD-10-CM | POA: Diagnosis present

## 2022-10-01 DIAGNOSIS — F17210 Nicotine dependence, cigarettes, uncomplicated: Secondary | ICD-10-CM

## 2022-10-01 DIAGNOSIS — L304 Erythema intertrigo: Secondary | ICD-10-CM | POA: Diagnosis not present

## 2022-10-01 DIAGNOSIS — I4821 Permanent atrial fibrillation: Secondary | ICD-10-CM | POA: Diagnosis not present

## 2022-10-01 DIAGNOSIS — I110 Hypertensive heart disease with heart failure: Secondary | ICD-10-CM | POA: Diagnosis present

## 2022-10-01 DIAGNOSIS — G9341 Metabolic encephalopathy: Secondary | ICD-10-CM | POA: Diagnosis not present

## 2022-10-01 DIAGNOSIS — E785 Hyperlipidemia, unspecified: Secondary | ICD-10-CM | POA: Diagnosis not present

## 2022-10-01 DIAGNOSIS — E871 Hypo-osmolality and hyponatremia: Secondary | ICD-10-CM | POA: Diagnosis present

## 2022-10-01 DIAGNOSIS — K7400 Hepatic fibrosis, unspecified: Secondary | ICD-10-CM | POA: Diagnosis not present

## 2022-10-01 DIAGNOSIS — N132 Hydronephrosis with renal and ureteral calculous obstruction: Secondary | ICD-10-CM | POA: Diagnosis not present

## 2022-10-01 DIAGNOSIS — J449 Chronic obstructive pulmonary disease, unspecified: Secondary | ICD-10-CM | POA: Diagnosis not present

## 2022-10-01 DIAGNOSIS — I5032 Chronic diastolic (congestive) heart failure: Secondary | ICD-10-CM

## 2022-10-01 DIAGNOSIS — Z6841 Body Mass Index (BMI) 40.0 and over, adult: Secondary | ICD-10-CM

## 2022-10-01 DIAGNOSIS — R7303 Prediabetes: Secondary | ICD-10-CM | POA: Diagnosis present

## 2022-10-01 DIAGNOSIS — J9621 Acute and chronic respiratory failure with hypoxia: Secondary | ICD-10-CM | POA: Diagnosis not present

## 2022-10-01 DIAGNOSIS — J440 Chronic obstructive pulmonary disease with acute lower respiratory infection: Secondary | ICD-10-CM | POA: Diagnosis not present

## 2022-10-01 DIAGNOSIS — Z9911 Dependence on respirator [ventilator] status: Secondary | ICD-10-CM | POA: Diagnosis not present

## 2022-10-01 DIAGNOSIS — Z7952 Long term (current) use of systemic steroids: Secondary | ICD-10-CM

## 2022-10-01 DIAGNOSIS — K219 Gastro-esophageal reflux disease without esophagitis: Secondary | ICD-10-CM | POA: Diagnosis present

## 2022-10-01 DIAGNOSIS — N12 Tubulo-interstitial nephritis, not specified as acute or chronic: Secondary | ICD-10-CM

## 2022-10-01 DIAGNOSIS — R0689 Other abnormalities of breathing: Secondary | ICD-10-CM | POA: Diagnosis not present

## 2022-10-01 DIAGNOSIS — Z79899 Other long term (current) drug therapy: Secondary | ICD-10-CM

## 2022-10-01 DIAGNOSIS — E739 Lactose intolerance, unspecified: Secondary | ICD-10-CM | POA: Diagnosis present

## 2022-10-01 DIAGNOSIS — A419 Sepsis, unspecified organism: Principal | ICD-10-CM | POA: Diagnosis present

## 2022-10-01 DIAGNOSIS — E662 Morbid (severe) obesity with alveolar hypoventilation: Secondary | ICD-10-CM | POA: Diagnosis not present

## 2022-10-01 DIAGNOSIS — R5383 Other fatigue: Secondary | ICD-10-CM | POA: Diagnosis not present

## 2022-10-01 DIAGNOSIS — J9611 Chronic respiratory failure with hypoxia: Secondary | ICD-10-CM | POA: Insufficient documentation

## 2022-10-01 DIAGNOSIS — Z7901 Long term (current) use of anticoagulants: Secondary | ICD-10-CM | POA: Diagnosis not present

## 2022-10-01 DIAGNOSIS — E8729 Other acidosis: Secondary | ICD-10-CM | POA: Diagnosis not present

## 2022-10-01 DIAGNOSIS — N1330 Unspecified hydronephrosis: Secondary | ICD-10-CM | POA: Diagnosis not present

## 2022-10-01 DIAGNOSIS — I517 Cardiomegaly: Secondary | ICD-10-CM | POA: Diagnosis not present

## 2022-10-01 DIAGNOSIS — N139 Obstructive and reflux uropathy, unspecified: Secondary | ICD-10-CM

## 2022-10-01 DIAGNOSIS — N136 Pyonephrosis: Secondary | ICD-10-CM | POA: Diagnosis present

## 2022-10-01 DIAGNOSIS — I509 Heart failure, unspecified: Secondary | ICD-10-CM | POA: Diagnosis not present

## 2022-10-01 DIAGNOSIS — N179 Acute kidney failure, unspecified: Secondary | ICD-10-CM | POA: Diagnosis present

## 2022-10-01 DIAGNOSIS — N201 Calculus of ureter: Secondary | ICD-10-CM | POA: Diagnosis not present

## 2022-10-01 DIAGNOSIS — Z8249 Family history of ischemic heart disease and other diseases of the circulatory system: Secondary | ICD-10-CM

## 2022-10-01 DIAGNOSIS — R0902 Hypoxemia: Secondary | ICD-10-CM | POA: Diagnosis not present

## 2022-10-01 DIAGNOSIS — B182 Chronic viral hepatitis C: Secondary | ICD-10-CM | POA: Diagnosis present

## 2022-10-01 DIAGNOSIS — D638 Anemia in other chronic diseases classified elsewhere: Secondary | ICD-10-CM | POA: Diagnosis present

## 2022-10-01 DIAGNOSIS — K439 Ventral hernia without obstruction or gangrene: Secondary | ICD-10-CM | POA: Diagnosis not present

## 2022-10-01 DIAGNOSIS — R140 Abdominal distension (gaseous): Secondary | ICD-10-CM | POA: Diagnosis not present

## 2022-10-01 DIAGNOSIS — J189 Pneumonia, unspecified organism: Secondary | ICD-10-CM | POA: Diagnosis not present

## 2022-10-01 DIAGNOSIS — Z833 Family history of diabetes mellitus: Secondary | ICD-10-CM

## 2022-10-01 DIAGNOSIS — N211 Calculus in urethra: Secondary | ICD-10-CM

## 2022-10-01 DIAGNOSIS — J90 Pleural effusion, not elsewhere classified: Secondary | ICD-10-CM | POA: Diagnosis not present

## 2022-10-01 DIAGNOSIS — J309 Allergic rhinitis, unspecified: Secondary | ICD-10-CM | POA: Diagnosis present

## 2022-10-01 DIAGNOSIS — Z825 Family history of asthma and other chronic lower respiratory diseases: Secondary | ICD-10-CM

## 2022-10-01 DIAGNOSIS — J168 Pneumonia due to other specified infectious organisms: Secondary | ICD-10-CM | POA: Diagnosis not present

## 2022-10-01 DIAGNOSIS — R6521 Severe sepsis with septic shock: Secondary | ICD-10-CM | POA: Diagnosis not present

## 2022-10-01 DIAGNOSIS — R791 Abnormal coagulation profile: Secondary | ICD-10-CM | POA: Diagnosis present

## 2022-10-01 DIAGNOSIS — Z4682 Encounter for fitting and adjustment of non-vascular catheter: Secondary | ICD-10-CM | POA: Diagnosis not present

## 2022-10-01 DIAGNOSIS — Z1152 Encounter for screening for COVID-19: Secondary | ICD-10-CM | POA: Diagnosis not present

## 2022-10-01 DIAGNOSIS — R531 Weakness: Secondary | ICD-10-CM | POA: Diagnosis not present

## 2022-10-01 DIAGNOSIS — R739 Hyperglycemia, unspecified: Secondary | ICD-10-CM | POA: Diagnosis not present

## 2022-10-01 DIAGNOSIS — Z960 Presence of urogenital implants: Secondary | ICD-10-CM | POA: Diagnosis not present

## 2022-10-01 DIAGNOSIS — J9601 Acute respiratory failure with hypoxia: Secondary | ICD-10-CM | POA: Diagnosis not present

## 2022-10-01 DIAGNOSIS — R001 Bradycardia, unspecified: Secondary | ICD-10-CM | POA: Diagnosis not present

## 2022-10-01 DIAGNOSIS — Z9981 Dependence on supplemental oxygen: Secondary | ICD-10-CM

## 2022-10-01 DIAGNOSIS — G4733 Obstructive sleep apnea (adult) (pediatric): Secondary | ICD-10-CM | POA: Diagnosis present

## 2022-10-01 DIAGNOSIS — I4891 Unspecified atrial fibrillation: Secondary | ICD-10-CM | POA: Diagnosis not present

## 2022-10-01 DIAGNOSIS — R590 Localized enlarged lymph nodes: Secondary | ICD-10-CM | POA: Diagnosis not present

## 2022-10-01 DIAGNOSIS — R918 Other nonspecific abnormal finding of lung field: Secondary | ICD-10-CM | POA: Diagnosis not present

## 2022-10-01 DIAGNOSIS — N390 Urinary tract infection, site not specified: Secondary | ICD-10-CM | POA: Diagnosis not present

## 2022-10-01 HISTORY — PX: CYSTOSCOPY WITH URETEROSCOPY AND STENT PLACEMENT: SHX6377

## 2022-10-01 LAB — CBC WITH DIFFERENTIAL/PLATELET
Abs Immature Granulocytes: 0.09 10*3/uL — ABNORMAL HIGH (ref 0.00–0.07)
Basophils Absolute: 0 10*3/uL (ref 0.0–0.1)
Basophils Relative: 0 %
Eosinophils Absolute: 0 10*3/uL (ref 0.0–0.5)
Eosinophils Relative: 0 %
HCT: 32.1 % — ABNORMAL LOW (ref 39.0–52.0)
Hemoglobin: 10.3 g/dL — ABNORMAL LOW (ref 13.0–17.0)
Immature Granulocytes: 1 %
Lymphocytes Relative: 7 %
Lymphs Abs: 0.8 10*3/uL (ref 0.7–4.0)
MCH: 26.4 pg (ref 26.0–34.0)
MCHC: 32.1 g/dL (ref 30.0–36.0)
MCV: 82.3 fL (ref 80.0–100.0)
Monocytes Absolute: 1.7 10*3/uL — ABNORMAL HIGH (ref 0.1–1.0)
Monocytes Relative: 14 %
Neutro Abs: 9.2 10*3/uL — ABNORMAL HIGH (ref 1.7–7.7)
Neutrophils Relative %: 78 %
Platelets: 212 10*3/uL (ref 150–400)
RBC: 3.9 MIL/uL — ABNORMAL LOW (ref 4.22–5.81)
RDW: 22.2 % — ABNORMAL HIGH (ref 11.5–15.5)
Smear Review: NORMAL
WBC: 11.8 10*3/uL — ABNORMAL HIGH (ref 4.0–10.5)
nRBC: 0 % (ref 0.0–0.2)

## 2022-10-01 LAB — POCT I-STAT 7, (LYTES, BLD GAS, ICA,H+H)
Acid-Base Excess: 1 mmol/L (ref 0.0–2.0)
Bicarbonate: 28.1 mmol/L — ABNORMAL HIGH (ref 20.0–28.0)
Calcium, Ion: 1.09 mmol/L — ABNORMAL LOW (ref 1.15–1.40)
HCT: 34 % — ABNORMAL LOW (ref 39.0–52.0)
Hemoglobin: 11.6 g/dL — ABNORMAL LOW (ref 13.0–17.0)
O2 Saturation: 100 %
Potassium: 2.8 mmol/L — ABNORMAL LOW (ref 3.5–5.1)
Sodium: 135 mmol/L (ref 135–145)
TCO2: 30 mmol/L (ref 22–32)
pCO2 arterial: 54.3 mmHg — ABNORMAL HIGH (ref 32–48)
pH, Arterial: 7.323 — ABNORMAL LOW (ref 7.35–7.45)
pO2, Arterial: 483 mmHg — ABNORMAL HIGH (ref 83–108)

## 2022-10-01 LAB — PROTIME-INR
INR: 2.6 — ABNORMAL HIGH (ref 0.8–1.2)
Prothrombin Time: 28.4 seconds — ABNORMAL HIGH (ref 11.4–15.2)

## 2022-10-01 LAB — URINALYSIS, W/ REFLEX TO CULTURE (INFECTION SUSPECTED)
Bilirubin Urine: NEGATIVE
Glucose, UA: 150 mg/dL — AB
Ketones, ur: NEGATIVE mg/dL
Nitrite: NEGATIVE
Protein, ur: 100 mg/dL — AB
Specific Gravity, Urine: 1.016 (ref 1.005–1.030)
pH: 7 (ref 5.0–8.0)

## 2022-10-01 LAB — PROCALCITONIN: Procalcitonin: 3.9 ng/mL

## 2022-10-01 LAB — BASIC METABOLIC PANEL
Anion gap: 12 (ref 5–15)
BUN: 27 mg/dL — ABNORMAL HIGH (ref 6–20)
CO2: 29 mmol/L (ref 22–32)
Calcium: 8.4 mg/dL — ABNORMAL LOW (ref 8.9–10.3)
Chloride: 91 mmol/L — ABNORMAL LOW (ref 98–111)
Creatinine, Ser: 1.97 mg/dL — ABNORMAL HIGH (ref 0.61–1.24)
GFR, Estimated: 38 mL/min — ABNORMAL LOW (ref >60)
Glucose, Bld: 128 mg/dL — ABNORMAL HIGH (ref 70–99)
Potassium: 3.2 mmol/L — ABNORMAL LOW (ref 3.5–5.1)
Sodium: 132 mmol/L — ABNORMAL LOW (ref 135–145)

## 2022-10-01 LAB — HEPATIC FUNCTION PANEL
ALT: 23 U/L (ref 0–44)
AST: 58 U/L — ABNORMAL HIGH (ref 15–41)
Albumin: 3 g/dL — ABNORMAL LOW (ref 3.5–5.0)
Alkaline Phosphatase: 47 U/L (ref 38–126)
Bilirubin, Direct: 0.4 mg/dL — ABNORMAL HIGH (ref 0.0–0.2)
Indirect Bilirubin: 1 mg/dL — ABNORMAL HIGH (ref 0.3–0.9)
Total Bilirubin: 1.4 mg/dL — ABNORMAL HIGH (ref 0.3–1.2)
Total Protein: 7.7 g/dL (ref 6.5–8.1)

## 2022-10-01 LAB — PHOSPHORUS: Phosphorus: 4.1 mg/dL (ref 2.5–4.6)

## 2022-10-01 LAB — RESP PANEL BY RT-PCR (RSV, FLU A&B, COVID)  RVPGX2
Influenza A by PCR: NEGATIVE
Influenza B by PCR: NEGATIVE
Resp Syncytial Virus by PCR: NEGATIVE
SARS Coronavirus 2 by RT PCR: NEGATIVE

## 2022-10-01 LAB — GLUCOSE, CAPILLARY
Glucose-Capillary: 153 mg/dL — ABNORMAL HIGH (ref 70–99)
Glucose-Capillary: 168 mg/dL — ABNORMAL HIGH (ref 70–99)
Glucose-Capillary: 172 mg/dL — ABNORMAL HIGH (ref 70–99)
Glucose-Capillary: 180 mg/dL — ABNORMAL HIGH (ref 70–99)

## 2022-10-01 LAB — LACTIC ACID, PLASMA
Lactic Acid, Venous: 2.5 mmol/L (ref 0.5–1.9)
Lactic Acid, Venous: 1 mmol/L (ref 0.5–1.9)

## 2022-10-01 LAB — BRAIN NATRIURETIC PEPTIDE: B Natriuretic Peptide: 275.3 pg/mL — ABNORMAL HIGH (ref 0.0–100.0)

## 2022-10-01 LAB — TROPONIN I (HIGH SENSITIVITY)
Troponin I (High Sensitivity): 37 ng/L — ABNORMAL HIGH (ref <18)
Troponin I (High Sensitivity): 24 ng/L — ABNORMAL HIGH (ref <18)

## 2022-10-01 LAB — HIV ANTIBODY (ROUTINE TESTING W REFLEX): HIV Screen 4th Generation wRfx: NONREACTIVE

## 2022-10-01 LAB — OSMOLALITY: Osmolality: 295 mOsm/kg (ref 275–295)

## 2022-10-01 LAB — MAGNESIUM: Magnesium: 2.4 mg/dL (ref 1.7–2.4)

## 2022-10-01 SURGERY — CYSTOURETEROSCOPY, WITH STENT INSERTION
Anesthesia: General | Site: Ureter | Laterality: Right

## 2022-10-01 MED ORDER — PIPERACILLIN-TAZOBACTAM 3.375 G IVPB
3.3750 g | Freq: Three times a day (TID) | INTRAVENOUS | Status: DC
  Administered 2022-10-01 – 2022-10-04 (×9): 3.375 g via INTRAVENOUS
  Filled 2022-10-01 (×9): qty 50

## 2022-10-01 MED ORDER — PROPOFOL 10 MG/ML IV BOLUS
INTRAVENOUS | Status: DC | PRN
  Administered 2022-10-01: 130 mg via INTRAVENOUS

## 2022-10-01 MED ORDER — SUCCINYLCHOLINE CHLORIDE 200 MG/10ML IV SOSY
PREFILLED_SYRINGE | INTRAVENOUS | Status: DC | PRN
  Administered 2022-10-01: 140 mg via INTRAVENOUS

## 2022-10-01 MED ORDER — DOCUSATE SODIUM 50 MG/5ML PO LIQD
100.0000 mg | Freq: Two times a day (BID) | ORAL | Status: DC
  Administered 2022-10-01 – 2022-10-02 (×2): 100 mg
  Filled 2022-10-01 (×3): qty 10

## 2022-10-01 MED ORDER — ORAL CARE MOUTH RINSE
15.0000 mL | OROMUCOSAL | Status: DC
  Administered 2022-10-01 (×3): 15 mL via OROMUCOSAL

## 2022-10-01 MED ORDER — SODIUM CHLORIDE 0.9 % IV SOLN: INTRAVENOUS | Status: DC

## 2022-10-01 MED ORDER — 0.9 % SODIUM CHLORIDE (POUR BTL) OPTIME
TOPICAL | Status: DC | PRN
  Administered 2022-10-01: 1000 mL

## 2022-10-01 MED ORDER — NOREPINEPHRINE 4 MG/250ML-% IV SOLN
INTRAVENOUS | Status: AC
  Filled 2022-10-01: qty 250

## 2022-10-01 MED ORDER — SODIUM CHLORIDE 0.9 % IV SOLN: INTRAVENOUS | Status: DC | PRN

## 2022-10-01 MED ORDER — DEXMEDETOMIDINE HCL IN NACL 400 MCG/100ML IV SOLN
0.0000 ug/kg/h | INTRAVENOUS | Status: DC
  Administered 2022-10-01: 0.8 ug/kg/h via INTRAVENOUS
  Administered 2022-10-01: 0.4 ug/kg/h via INTRAVENOUS
  Administered 2022-10-01: 1.2 ug/kg/h via INTRAVENOUS
  Administered 2022-10-02: 0.4 ug/kg/h via INTRAVENOUS
  Filled 2022-10-01 (×3): qty 100

## 2022-10-01 MED ORDER — METHYLPREDNISOLONE SODIUM SUCC 125 MG IJ SOLR
125.0000 mg | Freq: Once | INTRAMUSCULAR | Status: AC
  Administered 2022-10-01: 125 mg via INTRAVENOUS
  Filled 2022-10-01: qty 2

## 2022-10-01 MED ORDER — POLYETHYLENE GLYCOL 3350 17 G PO PACK
17.0000 g | PACK | Freq: Every day | ORAL | Status: DC
  Administered 2022-10-01 – 2022-10-02 (×2): 17 g
  Filled 2022-10-01 (×3): qty 1

## 2022-10-01 MED ORDER — ORAL CARE MOUTH RINSE: 15.0000 mL | OROMUCOSAL | Status: DC | PRN

## 2022-10-01 MED ORDER — ONDANSETRON HCL 4 MG/2ML IJ SOLN: 4.0000 mg | Freq: Four times a day (QID) | INTRAMUSCULAR | Status: DC | PRN

## 2022-10-01 MED ORDER — SODIUM CHLORIDE 0.9 % IV BOLUS (SEPSIS)
1000.0000 mL | Freq: Once | INTRAVENOUS | Status: AC
  Administered 2022-10-01: 1000 mL via INTRAVENOUS

## 2022-10-01 MED ORDER — UMECLIDINIUM-VILANTEROL 62.5-25 MCG/ACT IN AEPB: 1.0000 | INHALATION_SPRAY | Freq: Every day | RESPIRATORY_TRACT | Status: DC

## 2022-10-01 MED ORDER — FENTANYL 2500MCG IN NS 250ML (10MCG/ML) PREMIX INFUSION
50.0000 ug/h | INTRAVENOUS | Status: DC
  Administered 2022-10-01: 50 ug/h via INTRAVENOUS
  Filled 2022-10-01: qty 250

## 2022-10-01 MED ORDER — UMECLIDINIUM-VILANTEROL 62.5-25 MCG/ACT IN AEPB
1.0000 | INHALATION_SPRAY | Freq: Every day | RESPIRATORY_TRACT | Status: DC
  Filled 2022-10-01: qty 14

## 2022-10-01 MED ORDER — WATER FOR IRRIGATION, STERILE IR SOLN
Status: DC | PRN
  Administered 2022-10-01: 1000 mL

## 2022-10-01 MED ORDER — LIDOCAINE 2% (20 MG/ML) 5 ML SYRINGE
INTRAMUSCULAR | Status: DC | PRN
  Administered 2022-10-01: 100 mg via INTRAVENOUS

## 2022-10-01 MED ORDER — CHLORHEXIDINE GLUCONATE CLOTH 2 % EX PADS
6.0000 | MEDICATED_PAD | Freq: Every day | CUTANEOUS | Status: DC
  Administered 2022-10-02 – 2022-10-06 (×5): 6 via TOPICAL

## 2022-10-01 MED ORDER — FENTANYL BOLUS VIA INFUSION
50.0000 ug | INTRAVENOUS | Status: DC | PRN
  Administered 2022-10-02 (×2): 50 ug via INTRAVENOUS
  Administered 2022-10-02: 100 ug via INTRAVENOUS

## 2022-10-01 MED ORDER — ORAL CARE MOUTH RINSE
15.0000 mL | OROMUCOSAL | Status: DC
  Administered 2022-10-02 (×7): 15 mL via OROMUCOSAL

## 2022-10-01 MED ORDER — POLYETHYLENE GLYCOL 3350 17 G PO PACK: 17.0000 g | PACK | Freq: Every day | ORAL | Status: DC | PRN

## 2022-10-01 MED ORDER — MIDAZOLAM HCL 2 MG/2ML IJ SOLN
INTRAMUSCULAR | Status: AC
  Filled 2022-10-01: qty 2

## 2022-10-01 MED ORDER — ALBUTEROL SULFATE (2.5 MG/3ML) 0.083% IN NEBU: 3.0000 mL | INHALATION_SOLUTION | Freq: Four times a day (QID) | RESPIRATORY_TRACT | Status: DC | PRN

## 2022-10-01 MED ORDER — POTASSIUM CHLORIDE 10 MEQ/100ML IV SOLN
10.0000 meq | INTRAVENOUS | Status: AC
  Administered 2022-10-01 (×4): 10 meq via INTRAVENOUS
  Filled 2022-10-01 (×4): qty 100

## 2022-10-01 MED ORDER — FENTANYL CITRATE PF 50 MCG/ML IJ SOSY
50.0000 ug | PREFILLED_SYRINGE | INTRAMUSCULAR | Status: DC | PRN
  Administered 2022-10-01 (×2): 200 ug via INTRAVENOUS
  Filled 2022-10-01 (×2): qty 4

## 2022-10-01 MED ORDER — PROPOFOL 10 MG/ML IV BOLUS
INTRAVENOUS | Status: AC
  Filled 2022-10-01: qty 20

## 2022-10-01 MED ORDER — ROCURONIUM BROMIDE 10 MG/ML (PF) SYRINGE
PREFILLED_SYRINGE | INTRAVENOUS | Status: DC | PRN
  Administered 2022-10-01 (×2): 50 mg via INTRAVENOUS

## 2022-10-01 MED ORDER — ONDANSETRON HCL 4 MG/2ML IJ SOLN
INTRAMUSCULAR | Status: DC | PRN
  Administered 2022-10-01: 4 mg via INTRAVENOUS

## 2022-10-01 MED ORDER — PANTOPRAZOLE SODIUM 40 MG IV SOLR
40.0000 mg | INTRAVENOUS | Status: DC
  Administered 2022-10-01 – 2022-10-02 (×2): 40 mg via INTRAVENOUS
  Filled 2022-10-01 (×2): qty 10

## 2022-10-01 MED ORDER — PROPOFOL 500 MG/50ML IV EMUL
INTRAVENOUS | Status: DC | PRN
  Administered 2022-10-01: 50 ug/kg/min via INTRAVENOUS

## 2022-10-01 MED ORDER — FENTANYL CITRATE PF 50 MCG/ML IJ SOSY
50.0000 ug | PREFILLED_SYRINGE | Freq: Once | INTRAMUSCULAR | Status: AC
  Administered 2022-10-01: 50 ug via INTRAVENOUS

## 2022-10-01 MED ORDER — METRONIDAZOLE 500 MG/100ML IV SOLN
500.0000 mg | Freq: Once | INTRAVENOUS | Status: AC
  Administered 2022-10-01: 500 mg via INTRAVENOUS
  Filled 2022-10-01: qty 100

## 2022-10-01 MED ORDER — IPRATROPIUM-ALBUTEROL 0.5-2.5 (3) MG/3ML IN SOLN
3.0000 mL | Freq: Once | RESPIRATORY_TRACT | Status: AC
  Administered 2022-10-01: 3 mL via RESPIRATORY_TRACT
  Filled 2022-10-01: qty 3

## 2022-10-01 MED ORDER — PHENYLEPHRINE HCL-NACL 20-0.9 MG/250ML-% IV SOLN
INTRAVENOUS | Status: AC
  Filled 2022-10-01: qty 250

## 2022-10-01 MED ORDER — VANCOMYCIN HCL 1500 MG/300ML IV SOLN
1500.0000 mg | INTRAVENOUS | Status: DC
  Filled 2022-10-01: qty 300

## 2022-10-01 MED ORDER — FENTANYL CITRATE PF 50 MCG/ML IJ SOSY: 50.0000 ug | PREFILLED_SYRINGE | INTRAMUSCULAR | Status: DC | PRN

## 2022-10-01 MED ORDER — DOCUSATE SODIUM 100 MG PO CAPS: 100.0000 mg | ORAL_CAPSULE | Freq: Two times a day (BID) | ORAL | Status: DC | PRN

## 2022-10-01 MED ORDER — FENTANYL CITRATE (PF) 250 MCG/5ML IJ SOLN
INTRAMUSCULAR | Status: DC | PRN
  Administered 2022-10-01 (×2): 50 ug via INTRAVENOUS

## 2022-10-01 MED ORDER — VANCOMYCIN HCL 2000 MG/400ML IV SOLN
2000.0000 mg | Freq: Once | INTRAVENOUS | Status: AC
  Administered 2022-10-01: 2000 mg via INTRAVENOUS
  Filled 2022-10-01: qty 400

## 2022-10-01 MED ORDER — FENTANYL CITRATE (PF) 250 MCG/5ML IJ SOLN
INTRAMUSCULAR | Status: AC
  Filled 2022-10-01: qty 5

## 2022-10-01 MED ORDER — LACTATED RINGERS IV SOLN: INTRAVENOUS | Status: DC

## 2022-10-01 MED ORDER — PHENYLEPHRINE HCL-NACL 20-0.9 MG/250ML-% IV SOLN
INTRAVENOUS | Status: DC | PRN
  Administered 2022-10-01: 40 ug/min via INTRAVENOUS

## 2022-10-01 MED ORDER — ACETAMINOPHEN 325 MG PO TABS
650.0000 mg | ORAL_TABLET | Freq: Once | ORAL | Status: AC
  Administered 2022-10-01: 650 mg via ORAL
  Filled 2022-10-01: qty 2

## 2022-10-01 MED ORDER — SODIUM CHLORIDE 0.9 % IV SOLN
2.0000 g | Freq: Once | INTRAVENOUS | Status: AC
  Administered 2022-10-01: 2 g via INTRAVENOUS
  Filled 2022-10-01: qty 12.5

## 2022-10-01 MED ORDER — DEXMEDETOMIDINE HCL IN NACL 400 MCG/100ML IV SOLN
INTRAVENOUS | Status: AC
  Filled 2022-10-01: qty 100

## 2022-10-01 MED ORDER — IOHEXOL 300 MG/ML  SOLN
INTRAMUSCULAR | Status: DC | PRN
  Administered 2022-10-01: 50 mL

## 2022-10-01 MED ORDER — NOREPINEPHRINE 4 MG/250ML-% IV SOLN
0.0000 ug/min | INTRAVENOUS | Status: DC
  Administered 2022-10-01: 2 ug/min via INTRAVENOUS
  Filled 2022-10-01 (×2): qty 250

## 2022-10-01 MED ORDER — VANCOMYCIN HCL IN DEXTROSE 1-5 GM/200ML-% IV SOLN: 1000.0000 mg | Freq: Once | INTRAVENOUS | Status: DC

## 2022-10-01 SURGICAL SUPPLY — 23 items
BAG DRN RND TRDRP ANRFLXCHMBR (UROLOGICAL SUPPLIES) ×1
BAG URINE DRAIN 2000ML AR STRL (UROLOGICAL SUPPLIES) ×1 IMPLANT
BAG URO CATCHER STRL LF (MISCELLANEOUS) ×1 IMPLANT
CATH COUDE FOLEY 2W 5CC 20FR (CATHETERS) IMPLANT
CATH FOLEY 2WAY SLVR 5CC 16FR (CATHETERS) IMPLANT
CATH URETL OPEN END 6FR 70 (CATHETERS) ×1 IMPLANT
GLOVE BIO SURGEON STRL SZ7.5 (GLOVE) ×1 IMPLANT
GOWN STRL REUS W/ TWL LRG LVL3 (GOWN DISPOSABLE) ×1 IMPLANT
GOWN STRL REUS W/ TWL XL LVL3 (GOWN DISPOSABLE) ×1 IMPLANT
GOWN STRL REUS W/TWL LRG LVL3 (GOWN DISPOSABLE) ×2
GOWN STRL REUS W/TWL XL LVL3 (GOWN DISPOSABLE) ×1
GUIDEWIRE ANG ZIPWIRE 038X150 (WIRE) IMPLANT
GUIDEWIRE STR DUAL SENSOR (WIRE) ×1 IMPLANT
KIT TURNOVER KIT B (KITS) ×1 IMPLANT
MANIFOLD NEPTUNE II (INSTRUMENTS) ×1 IMPLANT
NS IRRIG 1000ML POUR BTL (IV SOLUTION) ×1 IMPLANT
PACK CYSTO (CUSTOM PROCEDURE TRAY) ×1 IMPLANT
STENT URET 6FRX24 CONTOUR (STENTS) IMPLANT
STENT URET 6FRX26 CONTOUR (STENTS) IMPLANT
SYPHON OMNI JUG (MISCELLANEOUS) ×1 IMPLANT
TOWEL GREEN STERILE FF (TOWEL DISPOSABLE) ×1 IMPLANT
TUBE CONNECTING 12X1/4 (SUCTIONS) IMPLANT
WATER STERILE IRR 3000ML UROMA (IV SOLUTION) ×1 IMPLANT

## 2022-10-01 NOTE — H&P (Signed)
NAME:  Frank Morales, MRN:  [id number], DOB:  December 16, 1960, LOS: 0 ADMISSION DATE:  10/01/2022, CONSULTATION DATE:  10/01/2022 REFERRING MD:  Durwin Nora, MD, CHIEF COMPLAINT:  AMS   History of Present Illness:  Frank Morales is a 62 year old male with PMH notable for CHF (EF 55-60%), Chronic Hep C, OSA, Tobacco abuse, HTN, Afib/Flutter (Xarelto). Pt presented to [facility] ED today 10/01/2022 with generalized weakness and SOB. Stated that his weakness has been ongoing for the past several days and he has recently become too weak to ambulate. Notably pt has history of COPD and CHD and wears 3L [place] at baseline. Pt endorsed dysuria and lower abdominal pain on arrival to ED. On interview and examination pt obtunded, awakening eyes only to firm sternal rub and noxious stimuli.  CT chest abdomen pelvis without contrast was performed in the emergency department which showed marked right hydronephrosis, multiple bilateral renal stones, and and right perinephric stranding which may be due to high-grade obstruction or superimposed pyelonephritis.  ED providers had reached out to urology service who plan to take patient emergently to the operating room for likely stent placement.  Patient is in septic shock secondary to above-PCCM was consulted for admission and patient will be admitted to ICU service further workup and evaluation.   Pertinent  Medical History  CHF (EF 55 to 60%), chronic hep C, OSA, tobacco abuse, hypertension, A-fib/flutter (Xarelto), COPD wears 3 L nasal cannula at baseline.   Significant Hospital Events: Including procedures, antibiotic start and stop dates in addition to other pertinent events   7/16-patient arrived to emergency department in septic shock with obstructive right kidney stone with hydronephrosis and right perinephric stranding-emergently will be taken to the OR  Interim History / Subjective:    Objective   Blood pressure 127/74, pulse 95, temperature (!) 100.9 F (38.3  C), temperature source Oral, resp. rate 17, height 5\' 11"  (1.803 m), weight (!) 142.9 kg, SpO2 100%.        Intake/Output Summary (Last 24 hours) at 10/01/2022 1657 Last data filed at 10/01/2022 1426 Gross per 24 hour  Intake 1596.12 ml  Output --  Net 1596.12 ml   Filed Weights   10/01/22 1030 10/01/22 1224  Weight: (!) 145.2 kg (!) 142.9 kg    Examination: Physical Exam Cardiovascular:     Rate and Rhythm: Normal rate and regular rhythm.  Pulmonary:     Breath sounds: Examination of the right-upper field reveals rhonchi. Examination of the left-upper field reveals rhonchi. Examination of the right-middle field reveals rhonchi. Examination of the left-middle field reveals rhonchi. Examination of the right-lower field reveals rhonchi. Examination of the left-lower field reveals rhonchi. Rhonchi present.     Comments: On 3L [place] Abdominal:     General: Bowel sounds are normal.     Palpations: Abdomen is soft.  Skin:    Capillary Refill: Capillary refill takes less than 2 seconds.  Neurological:     Comments: Obtunded, awakens eyes to firm sternal rub Does not follow commands or withdraw to noxious stimuli   Resolved Hospital Problem list     Assessment & Plan:  Septic shock secondary to Pyelonephritis vs CAP vs ? Right hydronephrosis with obstructing renal calculi 1.4cm at right ureteropelvic junction  Acute kidney injury secondary to above Septic criteria include temperature 102.9 F pulse 113 tachypnea 26 blood pressure 84/36 lactic acid 2.5 with source as shown above.  Serum creatinine baseline appears to be 0.8-1.0 came to the ED with  serum creatinine 1.97. -Emergency department providers had reached out to urology providers who plan to take patient to the OR emergently for stent placement -Blood and urine cultures sent- follow culture data -Procal pending -Cefepime, Flagyl, vancomycin given in the emergency department- proceed with IV vancomycin and zosyn abx- pharmacy to  manage -Levophed for goal MAP 65 or greater     Encephalopathic Secondary to Sepsis -Treat underlining cause -Delirium prevention measures - Avoid sedative medications     Community Acquired Pneumonia ? COPD- with Chronic Hypoxic Respiratory Failure CT scan showed bilateral lower lung patchy infiltrates and small right pleural effusion. Pt wears 3L [place] at baseline. Lungs rhonchi BL on auscultation -utilizing 3L [place] with adequate oxygen saturations -Continue home Anoro Ellipta and Albuterol  -supplemental oxygen as needed to maintain SPO2 goal 92% or greater -Incentive spirometer and pulm toileting     Lactic acidosis- resolved Lactic acid 2.5, two L IVF given in the emergency department and recheck ordered and pending.  Lactic acidosis and second septic shock and obstructive kidney stone     Chronic Diastolic HF Echocardiogram 08/08/2022 showed EF 55-60%  -restart home medications when appropriate- Cardizem, lasix, metolazone     Hypokalemia -replete and continue to monitor with AM labs     Hyponatremia Serum sodium 132 likely secondary to hypervolemia in setting of hydronephrosis -Serum osmo pending -continue to trend     Afib/Flutter Chronically Anticoagulated Coagulapathy  Hr controlled on interview 80-95. -Holding home Xarelto given OR today, restart when clinically appropriate  -Tele monitoring  Best Practice (right click and "Reselect all SmartList Selections" daily)   Diet/type: NPO DVT prophylaxis: SCD GI prophylaxis: N/A Lines: N/A Foley:  N/A Code Status:  full code Last date of multidisciplinary goals of care discussion [10/01/22 ]  Labs   CBC: Recent Labs  Lab 10/01/22 1041  WBC 11.8*  NEUTROABS 9.2*  HGB 10.3*  HCT 32.1*  MCV 82.3  PLT 212    Basic Metabolic Panel: Recent Labs  Lab 10/01/22 1041  NA 132*  K 3.2*  CL 91*  CO2 29  GLUCOSE 128*  BUN 27*  CREATININE 1.97*  CALCIUM 8.4*   GFR: Estimated Creatinine Clearance: 57.7  mL/min (A) (by C-G formula based on SCr of 1.97 mg/dL (H)). Recent Labs  Lab 10/01/22 1034 10/01/22 1041 10/01/22 1234  WBC  --  11.8*  --   LATICACIDVEN 2.5*  --  1.0    Liver Function Tests: Recent Labs  Lab 10/01/22 1041  AST 58*  ALT 23  ALKPHOS 47  BILITOT 1.4*  PROT 7.7  ALBUMIN 3.0*   No results for input(s): "LIPASE", "AMYLASE" in the last 168 hours. No results for input(s): "AMMONIA" in the last 168 hours.  ABG    Component Value Date/Time   PHART 7.294 (L) 07/03/2019 0248   PCO2ART 83.1 (HH) 07/03/2019 0248   PO2ART 77.0 (L) 07/03/2019 0248   HCO3 40.3 (H) 07/03/2019 0248   TCO2 43 (H) 07/03/2019 0248   O2SAT 92.0 07/03/2019 0248     Coagulation Profile: Recent Labs  Lab 10/01/22 1044  INR 2.6*    Cardiac Enzymes: No results for input(s): "CKTOTAL", "CKMB", "CKMBINDEX", "TROPONINI" in the last 168 hours.  HbA1C: Hemoglobin A1C  Date/Time Value Ref Range Status  12/04/2021 11:52 AM 5.6 4.0 - 5.6 % Final  04/04/2021 11:50 AM 5.7 (A) 4.0 - 5.6 % Final   Hgb A1c MFr Bld  Date/Time Value Ref Range Status  05/23/2020 11:23 AM 6.1 (H) 4.8 -  5.6 % Final    Comment:             Prediabetes: 5.7 - 6.4          Diabetes: >6.4          Glycemic control for adults with diabetes: <7.0   12/15/2019 07:08 PM 6.0 (H) 4.8 - 5.6 % Final    Comment:    (NOTE) Pre diabetes:          5.7%-6.4%  Diabetes:              >6.4%  Glycemic control for   <7.0% adults with diabetes     CBG: Recent Labs  Lab 10/01/22 1615  GLUCAP 153*    Review of Systems:   (-) except as listed in HPI and POC.   Past Medical History:  He,  has a past medical history of Acute exacerbation of CHF (congestive heart failure) (HCC) (12/15/2019), Acute heart failure (HCC) (12/16/2019), Angio-edema, Atrial fibrillation and flutter (HCC) (08/17/2015), Chronic diastolic CHF (congestive heart failure) (HCC) (08/30/2015), Dependence on continuous supplemental oxygen, Hematuria  (01/10/2021), Hepatic fibrosis (02/29/2016), Hepatitis C, chronic (HCC) (08/19/2015), Hepatitis C, chronic (HCC) (08/19/2015), History of cardiac catheterization, Hypertension, Hyposmia (12/22/2017), OSA (obstructive sleep apnea) (11/21/2015), Pleural effusion on right (03/14/2018), Sleep apnea, and Tobacco abuse.   Surgical History:   Past Surgical History:  Procedure Laterality Date   CARDIAC CATHETERIZATION N/A 08/21/2015   Procedure: Right/Left Heart Cath and Coronary Angiography;  Surgeon: Marykay Lex, MD;  Location: [facility] INVASIVE CV LAB;  Service: Cardiovascular;  Laterality: N/A;   CARDIOVERSION N/A 09/22/2015   Procedure: CARDIOVERSION;  Surgeon: Wendall Stade, MD;  Location: [facility] ENDOSCOPY;  Service: Cardiovascular;  Laterality: N/A;   COLONOSCOPY N/A 07/19/2016   Procedure: COLONOSCOPY;  Surgeon: Sherrilyn Rist, MD;  Location: [facility] ENDOSCOPY;  Service: Gastroenterology;  Laterality: N/A;   ESOPHAGOGASTRODUODENOSCOPY N/A 07/19/2016   Procedure: ESOPHAGOGASTRODUODENOSCOPY (EGD);  Surgeon: Sherrilyn Rist, MD;  Location: [facility] ENDOSCOPY;  Service: Gastroenterology;  Laterality: N/A;   IR THORACENTESIS ASP PLEURAL SPACE W/IMG GUIDE  07/09/2019   NO PAST SURGERIES     SINUS ENDO WITH FUSION N/A 10/02/2018   Procedure: SINUS ENDO WITH FUSION;  Surgeon: Suzanna Obey, MD;  Location: [facility] OR;  Service: ENT;  Laterality: N/A;     Social History:   reports that he has been smoking cigarettes. He has been exposed to tobacco smoke. He has never used smokeless tobacco. He reports current alcohol use of about 5.0 standard drinks of alcohol per week. He reports that he does not use drugs.   Family History:  His family history includes Allergic rhinitis in his mother; Asthma in his mother; Diabetes in his mother; Hypertension in his maternal grandfather and mother; Pneumonia in his father. There is no history of Colon cancer.   Allergies Allergies  Allergen Reactions   Lactose Intolerance (Gi) Nausea And  Vomiting     Home Medications  Prior to Admission medications   Medication Sig Start Date End Date Taking? Authorizing Provider  albuterol (VENTOLIN HFA) 108 (90 Base) MCG/ACT inhaler Inhale 2 puffs into the lungs every 6 (six) hours as needed for wheezing or shortness of breath. 05/23/20   Seawell, Jaimie A, DO  ANORO ELLIPTA 62.5-25 MCG/ACT AEPB INHALE 1 PUFF INTO THE LUNGS DAILY 05/07/22   Nooruddin, Jason Fila, MD  azelastine (ASTELIN) 0.1 % nasal spray USE 2 SPRAYS IN EACH NOSTRIL TWICE DAILY 04/16/22   Steffanie Rainwater, MD  dapagliflozin propanediol (FARXIGA) 10 MG TABS tablet TAKE 1 TABLET(10 MG) BY MOUTH DAILY BEFORE BREAKFAST 08/20/22   Nooruddin, Jason Fila, MD  diltiazem (CARDIZEM CD) 240 MG 24 hr capsule TAKE 2 CAPSULES(480 MG) BY MOUTH DAILY 11/22/21   Evlyn Kanner, MD  ferrous sulfate 325 (65 FE) MG tablet Take 1 tablet (325 mg total) by mouth every Monday, Wednesday, and Friday. 08/28/22 08/28/23  Nooruddin, Jason Fila, MD  furosemide (LASIX) 80 MG tablet TAKE 1 TABLET(80 MG) BY MOUTH TWICE DAILY 12/20/21   Rocky Morel, DO  levocetirizine (XYZAL) 5 MG tablet Take 1 tablet (5 mg total) by mouth every evening. 09/13/22   Padgett, Pilar Grammes, MD  metolazone (ZAROXOLYN) 2.5 MG tablet Take 1 tablet (2.5 mg total) by mouth once a week. Take every Wednesday. 08/20/22   Nooruddin, Jason Fila, MD  pantoprazole (PROTONIX) 20 MG tablet Take 1 tablet (20 mg total) by mouth daily. 08/20/22 08/15/23  Nooruddin, Jason Fila, MD  potassium chloride SA (KLOR-CON M) 20 MEQ tablet Take 2 tablets (40 mEq total) by mouth 2 (two) times daily. 07/26/22 10/24/22  Doran Stabler, DO  predniSONE (DELTASONE) 20 MG tablet Take 2 tablets (40 mg total) by mouth daily with breakfast. 07/16/22   Merrilyn Puma, MD  triamcinolone (NASACORT) 55 MCG/ACT AERO nasal inhaler 2 sprays each nostril daily for 1-2 weeks at a time before stopping once nasal congestion improves for best benefit. 09/13/22   Padgett, Pilar Grammes, MD  XARELTO 20 MG TABS tablet  TAKE 1 TABLET(20 MG) BY MOUTH DAILY WITH SUPPER 08/19/22   Nooruddin, Jason Fila, MD     Critical care time: 74 minutes    Janeece Riggers, AGACNP-BC [facility] For pager details, please see AMION or use EPIC chat After 1900, please call [facility] for cross coverage needs 10/01/2022 4:57 PM

## 2022-10-01 NOTE — Sepsis Progress Note (Signed)
Elink will follow per sepsis protocol  

## 2022-10-01 NOTE — Progress Notes (Addendum)
eLink Physician-Brief Progress Note Patient Name: TRELYN VANDERLINDE DOB: 11/11/1960 MRN: [id number]   Date of Service  10/01/2022  HPI/Events of Note  Notified that patient is on max dose of precedex gtt and remains intubated.   Pt is s/p cystoscopy with right retrograde pyelogram, right ureteral stent placement.  eICU Interventions  Add fentanyl gtt.      Intervention Category Intermediate Interventions: Other:  Larinda Buttery 10/01/2022, 9:01 PM  11:40 PM Notified that ETT appears to be about 5.3cm above the carina.  On personal review, it is around where the aortic knob is.    Plan> Would hold off on advancement.    5:35 AM Notified of bradycardia which on EKG shows atrial fibrillation in slow ventricular response.  Rate is in thes 50s-70s.  BP 96/67, MAP 77.  Precedex gtt has been off for 2 hours.   Plan> Discontinue precedex.  Continue on fentanyl.  Continue to monitor unless HR <45 or patient becomes hypotensive and/or symptomatic.

## 2022-10-01 NOTE — Consult Note (Signed)
H&P Physician requesting consult: Levon Hedger, MD  Chief Complaint: Right ureteral stone, sepsis  History of Present Illness: 62 year old male presented with shortness of breath and weakness since yesterday.  History of COPD on 3 L of oxygen at home.  Has been weak.  Was febrile and hypotensive.  Started on Levophed.  Takes Xarelto at home.  Does not know last dosage.  CT scan was performed that revealed a right ureteropelvic junction calculus with hydronephrosis.  Multiple bilateral stones.  Past Medical History:  Diagnosis Date   Acute exacerbation of CHF (congestive heart failure) (HCC) 12/15/2019   Acute heart failure (HCC) 12/16/2019   Angio-edema    Atrial fibrillation and flutter (HCC) 08/17/2015   A. S/p failed DCCV // b. Severe BAE on echo >> rate control strategy (has seen AF clinic) // c. Xarelto for anticoag (CHADS2-VASc=2 / CHF, HTN)   Chronic diastolic CHF (congestive heart failure) (HCC) 08/30/2015   A. Echo 6/17: Apical HK, moderate focal basal and mild concentric LVH, EF 50-55, diffuse HK, trivial MR, severe BAE, mild TR, PASP 37   Dependence on continuous supplemental oxygen    3L   Hematuria 01/10/2021   Hepatic fibrosis 02/29/2016   F3/4 on elastography.    Hepatitis C, chronic (HCC) 08/19/2015   Hepatitis C, chronic (HCC) 08/19/2015   Treated in 2017 with SVR   History of cardiac catheterization    a. LHC 6/17: LAD irregs, o/w no CAD   Hypertension    Hyposmia 12/22/2017   OSA (obstructive sleep apnea) 11/21/2015   No Cpap   Pleural effusion on right 03/14/2018   Sleep apnea    Tobacco abuse    Past Surgical History:  Procedure Laterality Date   CARDIAC CATHETERIZATION N/A 08/21/2015   Procedure: Right/Left Heart Cath and Coronary Angiography;  Surgeon: Marykay Lex, MD;  Location: [facility] INVASIVE CV LAB;  Service: Cardiovascular;  Laterality: N/A;   CARDIOVERSION N/A 09/22/2015   Procedure: CARDIOVERSION;  Surgeon: Wendall Stade, MD;  Location: [facility] ENDOSCOPY;   Service: Cardiovascular;  Laterality: N/A;   COLONOSCOPY N/A 07/19/2016   Procedure: COLONOSCOPY;  Surgeon: Sherrilyn Rist, MD;  Location: [facility] ENDOSCOPY;  Service: Gastroenterology;  Laterality: N/A;   ESOPHAGOGASTRODUODENOSCOPY N/A 07/19/2016   Procedure: ESOPHAGOGASTRODUODENOSCOPY (EGD);  Surgeon: Sherrilyn Rist, MD;  Location: [facility] ENDOSCOPY;  Service: Gastroenterology;  Laterality: N/A;   IR THORACENTESIS ASP PLEURAL SPACE W/IMG GUIDE  07/09/2019   NO PAST SURGERIES     SINUS ENDO WITH FUSION N/A 10/02/2018   Procedure: SINUS ENDO WITH FUSION;  Surgeon: Suzanna Obey, MD;  Location: [facility] OR;  Service: ENT;  Laterality: N/A;    Home Medications:  Medications Prior to Admission  Medication Sig Dispense Refill Last Dose   albuterol (VENTOLIN HFA) 108 (90 Base) MCG/ACT inhaler Inhale 2 puffs into the lungs every 6 (six) hours as needed for wheezing or shortness of breath. 8 g 2    ANORO ELLIPTA 62.5-25 MCG/ACT AEPB INHALE 1 PUFF INTO THE LUNGS DAILY 60 each 2    azelastine (ASTELIN) 0.1 % nasal spray USE 2 SPRAYS IN EACH NOSTRIL TWICE DAILY 90 mL 1    dapagliflozin propanediol (FARXIGA) 10 MG TABS tablet TAKE 1 TABLET(10 MG) BY MOUTH DAILY BEFORE BREAKFAST 90 tablet 3    diltiazem (CARDIZEM CD) 240 MG 24 hr capsule TAKE 2 CAPSULES(480 MG) BY MOUTH DAILY 180 capsule 3    ferrous sulfate 325 (65 FE) MG tablet Take 1 tablet (325 mg total) by  mouth every Monday, Wednesday, and Friday. 30 tablet 3    furosemide (LASIX) 80 MG tablet TAKE 1 TABLET(80 MG) BY MOUTH TWICE DAILY 180 tablet 3    levocetirizine (XYZAL) 5 MG tablet Take 1 tablet (5 mg total) by mouth every evening. 30 tablet 4    metolazone (ZAROXOLYN) 2.5 MG tablet Take 1 tablet (2.5 mg total) by mouth once a week. Take every Wednesday. 30 tablet 0    pantoprazole (PROTONIX) 20 MG tablet Take 1 tablet (20 mg total) by mouth daily. 90 tablet 3    potassium chloride SA (KLOR-CON M) 20 MEQ tablet Take 2 tablets (40 mEq total) by mouth 2 (two)  times daily. 120 tablet 2    predniSONE (DELTASONE) 20 MG tablet Take 2 tablets (40 mg total) by mouth daily with breakfast. 10 tablet 0    triamcinolone (NASACORT) 55 MCG/ACT AERO nasal inhaler 2 sprays each nostril daily for 1-2 weeks at a time before stopping once nasal congestion improves for best benefit. 16.9 mL 4    XARELTO 20 MG TABS tablet TAKE 1 TABLET(20 MG) BY MOUTH DAILY WITH SUPPER 90 tablet 2    Allergies:  Allergies  Allergen Reactions   Lactose Intolerance (Gi) Nausea And Vomiting    Family History  Problem Relation Age of Onset   Asthma Mother    Allergic rhinitis Mother    Hypertension Mother    Diabetes Mother    Pneumonia Father    Hypertension Maternal Grandfather    Colon cancer Neg Hx    Social History:  reports that he has been smoking cigarettes. He has been exposed to tobacco smoke. He has never used smokeless tobacco. He reports current alcohol use of about 5.0 standard drinks of alcohol per week. He reports that he does not use drugs.  ROS: A complete review of systems was performed.  All systems are negative except for pertinent findings as noted. ROS   Physical Exam:  Vital signs in last 24 hours: Temp:  [100.9 F (38.3 C)-102.9 F (39.4 C)] 100.9 F (38.3 C) (07/16 1224) Pulse Rate:  [90-113] 95 (07/16 1500) Resp:  [17-26] 17 (07/16 1500) BP: (84-127)/(36-83) 127/74 (07/16 1500) SpO2:  [92 %-100 %] 100 % (07/16 1500) Weight:  [142.9 kg-145.2 kg] 142.9 kg (07/16 1224) General: Patient is in acute distress, less responsive, sweaty HEENT: Normocephalic, atraumatic Neck: No JVD or lymphadenopathy Cardiovascular: Regular rate and rhythm Lungs: Regular rate and effort Abdomen: Soft, nontender, morbidly obese Back: No CVA tenderness Extremities: No edema Neurologic: Confused but oriented to person, place, time Laboratory Data:  Results for orders placed or performed during the hospital encounter of 10/01/22 (from the past 24 hour(s))  Lactic  acid, plasma     Status: Abnormal   Collection Time: 10/01/22 10:34 AM  Result Value Ref Range   Lactic Acid, Venous 2.5 (HH) 0.5 - 1.9 mmol/L  CBC with Differential     Status: Abnormal   Collection Time: 10/01/22 10:41 AM  Result Value Ref Range   WBC 11.8 (H) 4.0 - 10.5 K/uL   RBC 3.90 (L) 4.22 - 5.81 MIL/uL   Hemoglobin 10.3 (L) 13.0 - 17.0 g/dL   HCT 16.1 (L) 09.6 - 04.5 %   MCV 82.3 80.0 - 100.0 fL   MCH 26.4 26.0 - 34.0 pg   MCHC 32.1 30.0 - 36.0 g/dL   RDW 40.9 (H) 81.1 - 91.4 %   Platelets 212 150 - 400 K/uL   nRBC 0.0 0.0 - 0.2 %  Neutrophils Relative % 78 %   Neutro Abs 9.2 (H) 1.7 - 7.7 K/uL   Lymphocytes Relative 7 %   Lymphs Abs 0.8 0.7 - 4.0 K/uL   Monocytes Relative 14 %   Monocytes Absolute 1.7 (H) 0.1 - 1.0 K/uL   Eosinophils Relative 0 %   Eosinophils Absolute 0.0 0.0 - 0.5 K/uL   Basophils Relative 0 %   Basophils Absolute 0.0 0.0 - 0.1 K/uL   WBC Morphology MORPHOLOGY UNREMARKABLE    RBC Morphology See Note    Smear Review Normal platelet morphology    Immature Granulocytes 1 %   Abs Immature Granulocytes 0.09 (H) 0.00 - 0.07 K/uL   Polychromasia PRESENT    Ovalocytes PRESENT   Basic metabolic panel     Status: Abnormal   Collection Time: 10/01/22 10:41 AM  Result Value Ref Range   Sodium 132 (L) 135 - 145 mmol/L   Potassium 3.2 (L) 3.5 - 5.1 mmol/L   Chloride 91 (L) 98 - 111 mmol/L   CO2 29 22 - 32 mmol/L   Glucose, Bld 128 (H) 70 - 99 mg/dL   BUN 27 (H) 6 - 20 mg/dL   Creatinine, Ser 4.09 (H) 0.61 - 1.24 mg/dL   Calcium 8.4 (L) 8.9 - 10.3 mg/dL   GFR, Estimated 38 (L) >60 mL/min   Anion gap 12 5 - 15  Brain natriuretic peptide     Status: Abnormal   Collection Time: 10/01/22 10:41 AM  Result Value Ref Range   B Natriuretic Peptide 275.3 (H) 0.0 - 100.0 pg/mL  Hepatic function panel     Status: Abnormal   Collection Time: 10/01/22 10:41 AM  Result Value Ref Range   Total Protein 7.7 6.5 - 8.1 g/dL   Albumin 3.0 (L) 3.5 - 5.0 g/dL   AST 58  (H) 15 - 41 U/L   ALT 23 0 - 44 U/L   Alkaline Phosphatase 47 38 - 126 U/L   Total Bilirubin 1.4 (H) 0.3 - 1.2 mg/dL   Bilirubin, Direct 0.4 (H) 0.0 - 0.2 mg/dL   Indirect Bilirubin 1.0 (H) 0.3 - 0.9 mg/dL  Resp panel by RT-PCR (RSV, Flu A&B, Covid) Anterior Nasal Swab     Status: None   Collection Time: 10/01/22 10:44 AM   Specimen: Anterior Nasal Swab  Result Value Ref Range   SARS Coronavirus 2 by RT PCR NEGATIVE NEGATIVE   Influenza A by PCR NEGATIVE NEGATIVE   Influenza B by PCR NEGATIVE NEGATIVE   Resp Syncytial Virus by PCR NEGATIVE NEGATIVE  Protime-INR     Status: Abnormal   Collection Time: 10/01/22 10:44 AM  Result Value Ref Range   Prothrombin Time 28.4 (H) 11.4 - 15.2 seconds   INR 2.6 (H) 0.8 - 1.2  Lactic acid, plasma     Status: None   Collection Time: 10/01/22 12:34 PM  Result Value Ref Range   Lactic Acid, Venous 1.0 0.5 - 1.9 mmol/L  Urinalysis, w/ Reflex to Culture (Infection Suspected) -Urine, Clean Catch     Status: Abnormal   Collection Time: 10/01/22  1:13 PM  Result Value Ref Range   Specimen Source URINE, CLEAN CATCH    Color, Urine YELLOW YELLOW   APPearance HAZY (A) CLEAR   Specific Gravity, Urine 1.016 1.005 - 1.030   pH 7.0 5.0 - 8.0   Glucose, UA 150 (A) NEGATIVE mg/dL   Hgb urine dipstick MODERATE (A) NEGATIVE   Bilirubin Urine NEGATIVE NEGATIVE   Ketones, ur NEGATIVE  NEGATIVE mg/dL   Protein, ur 161 (A) NEGATIVE mg/dL   Nitrite NEGATIVE NEGATIVE   Leukocytes,Ua SMALL (A) NEGATIVE   RBC / HPF 6-10 0 - 5 RBC/hpf   WBC, UA 21-50 0 - 5 WBC/hpf   Bacteria, UA FEW (A) NONE SEEN   Squamous Epithelial / HPF 0-5 0 - 5 /HPF   Mucus PRESENT   Troponin I (High Sensitivity)     Status: Abnormal   Collection Time: 10/01/22  2:09 PM  Result Value Ref Range   Troponin I (High Sensitivity) 37 (H) <18 ng/L  Glucose, capillary     Status: Abnormal   Collection Time: 10/01/22  4:15 PM  Result Value Ref Range   Glucose-Capillary 153 (H) 70 - 99 mg/dL    Recent Results (from the past 240 hour(s))  Resp panel by RT-PCR (RSV, Flu A&B, Covid) Anterior Nasal Swab     Status: None   Collection Time: 10/01/22 10:44 AM   Specimen: Anterior Nasal Swab  Result Value Ref Range Status   SARS Coronavirus 2 by RT PCR NEGATIVE NEGATIVE Final   Influenza A by PCR NEGATIVE NEGATIVE Final   Influenza B by PCR NEGATIVE NEGATIVE Final    Comment: (NOTE) The Xpert Xpress SARS-CoV-2/FLU/RSV plus assay is intended as an aid in the diagnosis of influenza from Nasopharyngeal swab specimens and should not be used as a sole basis for treatment. Nasal washings and aspirates are unacceptable for Xpert Xpress SARS-CoV-2/FLU/RSV testing.  Fact Sheet for Patients: BloggerCourse.com  Fact Sheet for Healthcare Providers: SeriousBroker.it  This test is not yet approved or cleared by the [place] FDA and has been authorized for detection and/or diagnosis of SARS-CoV-2 by FDA under an Emergency Use Authorization (EUA). This EUA will remain in effect (meaning this test can be used) for the duration of the COVID-19 declaration under Section 564(b)(1) of the Act, 21 U.S.C. section 360bbb-3(b)(1), unless the authorization is terminated or revoked.     Resp Syncytial Virus by PCR NEGATIVE NEGATIVE Final    Comment: (NOTE) Fact Sheet for Patients: BloggerCourse.com  Fact Sheet for Healthcare Providers: SeriousBroker.it  This test is not yet approved or cleared by the [place] FDA and has been authorized for detection and/or diagnosis of SARS-CoV-2 by FDA under an Emergency Use Authorization (EUA). This EUA will remain in effect (meaning this test can be used) for the duration of the COVID-19 declaration under Section 564(b)(1) of the Act, 21 U.S.C. section 360bbb-3(b)(1), unless the authorization is terminated or revoked.  Performed at [facility], [address], [place], [place] [address]    Creatinine: Recent Labs    10/01/22 1041  CREATININE 1.97*    Impression/Assessment:  Right ureteral calculus Right ureteral obstruction secondary to calculus Sepsis  Plan:  Will proceed urgently to the operating room for cystoscopy with right ureteral stent placement.  Risk benefits discussed.  I did discuss the risk of death with stone and sepsis.  He is on Xarelto so risk of bleeding with nephrostomy tube placement.  Therefore we will proceed with stent.  Ray Church, III 10/01/2022, 4:19 PM

## 2022-10-01 NOTE — Progress Notes (Signed)
ED Pharmacy Antibiotic Sign Off An antibiotic consult was received from an ED provider for sepsis per pharmacy dosing for vancomycin and cefepime. A chart review was completed to assess appropriateness.   The following one time order(s) were placed:  Cefepime 2g IV x 1 Vancomycin 2000 mg IV x 1  Further antibiotic and/or antibiotic pharmacy consults should be ordered by the admitting provider if indicated.   Thank you for allowing pharmacy to be a part of this patient's care.   Daylene Posey, [facility]  Clinical Pharmacist 10/01/22 10:52 AM

## 2022-10-01 NOTE — Progress Notes (Signed)
Pharmacy Antibiotic Note  Frank Morales is a 62 y.o. male admitted on 10/01/2022 with obstructing right UPJ stone and PNA.  Pharmacy has been consulted for vancomycin and piperacillin/tazobactam dosing.  Patient received vancomycin in ED.  7/16 Vancomycin 1500mg  Q 24 hr Scr used: 1.97 mg/dL Weight: 016.0 kg Vd coeff: 0.5 L/kg Est AUC: 530  Plan: Vancomycin 1500mg  Q24 hr Piperacillin/tazobactam 3.375g Q8h EI Monitor cultures, clinical status, renal function, vancomycin level Narrow abx as able and f/u duration    Height: 5\' 11"  (180.3 cm) Weight: (!) 142.9 kg (315 lb) IBW/kg (Calculated) : 75.3  Temp (24hrs), Avg:101.9 F (38.8 C), Min:100.9 F (38.3 C), Max:102.9 F (39.4 C)  Recent Labs  Lab 10/01/22 1034 10/01/22 1041 10/01/22 1234  WBC  --  11.8*  --   CREATININE  --  1.97*  --   LATICACIDVEN 2.5*  --  1.0    Estimated Creatinine Clearance: 57.7 mL/min (A) (by C-G formula based on SCr of 1.97 mg/dL (H)).    Allergies  Allergen Reactions   Lactose Intolerance (Gi) Nausea And Vomiting    Antimicrobials this admission: Cefe 7/16 MTZ 7/16 Vanc 7/16 >>  Piptazo 7/16 >>   Dose adjustments this admission: N/a  Microbiology results: 7/16 BCx:  7/16 UCx:   7/16 MRSA PCR:  7/16 Resp cx:  7/16: body fl cx:   Thank you for allowing pharmacy to be a part of this patient's care.  Alphia Moh, PharmD, BCPS, BCCP Clinical Pharmacist  Please check AMION for all [facility] Pharmacy phone numbers After 10:00 PM, call Main Pharmacy [phone]

## 2022-10-01 NOTE — Anesthesia Preprocedure Evaluation (Signed)
Anesthesia Evaluation  Patient identified by MRN, date of birth, ID band Patient awake    Reviewed: Allergy & Precautions, NPO status , Patient's Chart, lab work & pertinent test results  History of Anesthesia Complications Negative for: history of anesthetic complications  Airway Mallampati: III  TM Distance: >3 FB Neck ROM: Full    Dental  (+) Dental Advisory Given, Poor Dentition   Pulmonary sleep apnea , Current Smoker   Pulmonary exam normal        Cardiovascular hypertension, Pt. on medications +CHF  Normal cardiovascular exam+ dysrhythmias Atrial Fibrillation  Rhythm:Irregular  Study Conclusions  - Left ventricle: The cavity size was normal. Wall thickness was   increased in a pattern of mild LVH. Systolic function was normal.   The estimated ejection fraction was in the range of 55% to 60%.   Wall motion was normal; there were no regional wall motion   abnormalities. - Ventricular septum: Septal motion showed abnormal function and   dyssynergy. The contour showed diastolic flattening. - Mitral valve: Calcified annulus. - Left atrium: The atrium was mildly dilated. - Right ventricle: The cavity size was mildly dilated. Wall   thickness was normal. Systolic function was moderately reduced. - Right atrium: The atrium was severely dilated. - Tricuspid valve: There was mild regurgitation.    Neuro/Psych negative neurological ROS  negative psych ROS   GI/Hepatic negative GI ROS,,,(+) Hepatitis -, C  Endo/Other    Morbid obesity  Renal/GU negative Renal ROS     Musculoskeletal negative musculoskeletal ROS (+)    Abdominal   Peds  Hematology negative hematology ROS (+)   Anesthesia Other Findings Urosepsis  Reproductive/Obstetrics                             Anesthesia Physical Anesthesia Plan  ASA: 4 and emergent  Anesthesia Plan: General   Post-op Pain Management:     Induction: Intravenous  PONV Risk Score and Plan: 1 and Ondansetron and Treatment may vary due to age or medical condition  Airway Management Planned: Oral ETT  Additional Equipment:   Intra-op Plan:   Post-operative Plan: Post-operative intubation/ventilation  Informed Consent: I have reviewed the patients History and Physical, chart, labs and discussed the procedure including the risks, benefits and alternatives for the proposed anesthesia with the patient or authorized representative who has indicated his/her understanding and acceptance.     Dental advisory given  Plan Discussed with: CRNA and Anesthesiologist  Anesthesia Plan Comments:         Anesthesia Quick Evaluation

## 2022-10-01 NOTE — Transfer of Care (Signed)
Immediate Anesthesia Transfer of Care Note  Patient: Frank Morales  Procedure(s) Performed: CYSTOSCOPY WITH URETEROSCOPY AND STENT PLACEMENT (Right: Ureter)  Patient Location: ICU  Anesthesia Type:General  Level of Consciousness: sedated  Airway & Oxygen Therapy: Patient placed on Ventilator (see vital sign flow sheet for setting)  Post-op Assessment: Report given to RN and Post -op Vital signs reviewed and stable  Post vital signs: stable  Last Vitals:  Vitals Value Taken Time  BP 121/84 10/01/22 1800  Temp    Pulse 82 10/01/22 1800  Resp 16 10/01/22 1800  SpO2 100 % 10/01/22 1800  Vitals shown include unfiled device data.  Last Pain:  Vitals:   10/01/22 1224  TempSrc: Oral  PainSc:          Complications: No notable events documented.

## 2022-10-01 NOTE — Anesthesia Procedure Notes (Signed)
Procedure Name: Intubation Date/Time: 10/01/2022 4:40 PM  Performed by: Kayleen Memos, CRNAPre-anesthesia Checklist: Patient identified, Emergency Drugs available, Suction available, Patient being monitored and Timeout performed Patient Re-evaluated:Patient Re-evaluated prior to induction Oxygen Delivery Method: Circle system utilized Preoxygenation: Pre-oxygenation with 100% oxygen Induction Type: IV induction and Rapid sequence Laryngoscope Size: Glidescope and 4 Grade View: Grade I Tube size: 8.0 mm Number of attempts: 1 Airway Equipment and Method: Rigid stylet and Video-laryngoscopy Placement Confirmation: ETT inserted through vocal cords under direct vision, positive ETCO2 and breath sounds checked- equal and bilateral Secured at: 27 cm Tube secured with: Tape Dental Injury: Teeth and Oropharynx as per pre-operative assessment

## 2022-10-01 NOTE — Progress Notes (Deleted)
NAME:  Frank Morales, MRN:  [id number], DOB:  [birth year], LOS: 0 ADMISSION DATE:  10/01/2022, CONSULTATION DATE:  10/01/22 REFERRING MD:  Durwin Nora, MD, CHIEF COMPLAINT:  AMS   History of Present Illness:  Frank Morales is a 62 year old male with PMH notable for CHF (EF 55-60%), Chronic Hep C, OSA, Tobacco abuse, HTN, Afib/Flutter (Xarelto). Pt presented to [facility] ED today 10/01/2022 with generalized weakness and SOB. Stated that his weakness has been ongoing for the past several days and he has recently become too weak to ambulate. Notably pt has history of COPD and CHD and wears 3L [place] at baseline. Pt endorsed dysuria and lower abdominal pain on arrival to ED. On interview and examination pt obtunded, awakening eyes only to firm sternal rub and noxious stimuli.  CT chest abdomen pelvis without contrast was performed in the emergency department which showed marked right hydronephrosis, multiple bilateral renal stones, and and right perinephric stranding which may be due to high-grade obstruction or superimposed pyelonephritis.  ED providers had reached out to urology service who plan to take patient emergently to the operating room for likely stent placement.  Patient is in septic shock secondary to above-PCCM was consulted for admission and patient will be admitted to ICU service further workup and evaluation.  Pertinent  Medical History  CHF (EF 55 to 60%), chronic hep C, OSA, tobacco abuse, hypertension, A-fib/flutter (Xarelto), COPD wears 3 L nasal cannula at baseline.  Significant Hospital Events: Including procedures, antibiotic start and stop dates in addition to other pertinent events   7/16-patient arrived to emergency department in septic shock with obstructive right kidney stone with hydronephrosis and right perinephric stranding-emergently will be taken to the OR  Interim History / Subjective:    Objective   Blood pressure 127/74, pulse 95, temperature (!) 100.9 F (38.3 C),  temperature source Oral, resp. rate 17, height 5\' 11"  (1.803 m), weight (!) 142.9 kg, SpO2 100%.        Intake/Output Summary (Last 24 hours) at 10/01/2022 1603 Last data filed at 10/01/2022 1426 Gross per 24 hour  Intake 1596.12 ml  Output --  Net 1596.12 ml   Filed Weights   10/01/22 1030 10/01/22 1224  Weight: (!) 145.2 kg (!) 142.9 kg    Examination: Physical Exam Cardiovascular:     Rate and Rhythm: Normal rate and regular rhythm.  Pulmonary:     Breath sounds: Examination of the right-upper field reveals rhonchi. Examination of the left-upper field reveals rhonchi. Examination of the right-middle field reveals rhonchi. Examination of the left-middle field reveals rhonchi. Examination of the right-lower field reveals rhonchi. Examination of the left-lower field reveals rhonchi. Rhonchi present.     Comments: On 3L [place] Abdominal:     General: Bowel sounds are normal.     Palpations: Abdomen is soft.  Skin:    Capillary Refill: Capillary refill takes less than 2 seconds.  Neurological:     Comments: Obtunded, awakens eyes to firm sternal rub Does not follow commands or withdraw to noxious stimuli      Resolved Hospital Problem list     Assessment & Plan:  Septic shock secondary to Pyelonephritis vs CAP vs ? Right hydronephrosis with obstructing renal calculi 1.4cm at right ureteropelvic junction  Acute kidney injury secondary to above Septic criteria include temperature 102.9 F pulse 113 tachypnea 26 blood pressure 84/36 lactic acid 2.5 with source as shown above.  Serum creatinine baseline appears to be 0.8-1.0 came to the ED  with serum creatinine 1.97. -Emergency department providers had reached out to urology providers who plan to take patient to the OR emergently for stent placement -Blood and urine cultures sent- follow culture data -Procal pending -Cefepime, Flagyl, vancomycin given in the emergency department- proceed with IV vancomycin and zosyn abx- pharmacy to  manage -Levophed for goal MAP 65 or greater   Encephalopathic Secondary to Sepsis -Treat underlining cause -Delirium prevention measures - Avoid sedative medications   Community Acquired Pneumonia ? COPD- with Chronic Hypoxic Respiratory Failure CT scan showed bilateral lower lung patchy infiltrates and small right pleural effusion. Pt wears 3L [place] at baseline. Lungs rhonchi BL on auscultation -utilizing 3L [place] with adequate oxygen saturations -Continue home Anoro Ellipta and Albuterol  -supplemental oxygen as needed to maintain SPO2 goal 92% or greater -Incentive spirometer and pulm toileting   Lactic acidosis- resolved Lactic acid 2.5, two L IVF given in the emergency department and recheck ordered and pending.  Lactic acidosis and second septic shock and obstructive kidney stone   Chronic Diastolic HF Echocardiogram 08/08/2022 showed EF 55-60%  -restart home medications when appropriate- Cardizem, lasix, metolazone   Hypokalemia -replete and continue to monitor with AM labs   Hyponatremia Serum sodium 132 likely secondary to hypervolemia in setting of hydronephrosis -Serum osmo pending -continue to trend   Afib/Flutter Chronically Anticoagulated Coagulapathy  Hr controlled on interview 80-95. -Holding home Xarelto given OR today, restart when clinically appropriate  -Tele monitoring   Best Practice (right click and "Reselect all SmartList Selections" daily)   Diet/type: NPO DVT prophylaxis: SCD GI prophylaxis: N/A Lines: N/A Foley:  N/A Code Status:  full code Last date of multidisciplinary goals of care discussion [10/01/22 ]  Labs   CBC: Recent Labs  Lab 10/01/22 1041  WBC 11.8*  NEUTROABS 9.2*  HGB 10.3*  HCT 32.1*  MCV 82.3  PLT 212    Basic Metabolic Panel: Recent Labs  Lab 10/01/22 1041  NA 132*  K 3.2*  CL 91*  CO2 29  GLUCOSE 128*  BUN 27*  CREATININE 1.97*  CALCIUM 8.4*   GFR: Estimated Creatinine Clearance: 57.7 mL/min (A)  (by C-G formula based on SCr of 1.97 mg/dL (H)). Recent Labs  Lab 10/01/22 1034 10/01/22 1041 10/01/22 1234  WBC  --  11.8*  --   LATICACIDVEN 2.5*  --  1.0    Liver Function Tests: Recent Labs  Lab 10/01/22 1041  AST 58*  ALT 23  ALKPHOS 47  BILITOT 1.4*  PROT 7.7  ALBUMIN 3.0*   No results for input(s): "LIPASE", "AMYLASE" in the last 168 hours. No results for input(s): "AMMONIA" in the last 168 hours.  ABG    Component Value Date/Time   PHART 7.294 (L) 07/03/2019 0248   PCO2ART 83.1 (HH) 07/03/2019 0248   PO2ART 77.0 (L) 07/03/2019 0248   HCO3 40.3 (H) 07/03/2019 0248   TCO2 43 (H) 07/03/2019 0248   O2SAT 92.0 07/03/2019 0248     Coagulation Profile: Recent Labs  Lab 10/01/22 1044  INR 2.6*    Cardiac Enzymes: No results for input(s): "CKTOTAL", "CKMB", "CKMBINDEX", "TROPONINI" in the last 168 hours.  HbA1C: Hemoglobin A1C  Date/Time Value Ref Range Status  12/04/2021 11:52 AM 5.6 4.0 - 5.6 % Final  04/04/2021 11:50 AM 5.7 (A) 4.0 - 5.6 % Final   Hgb A1c MFr Bld  Date/Time Value Ref Range Status  05/23/2020 11:23 AM 6.1 (H) 4.8 - 5.6 % Final    Comment:  Prediabetes: 5.7 - 6.4          Diabetes: >6.4          Glycemic control for adults with diabetes: <7.0   12/15/2019 07:08 PM 6.0 (H) 4.8 - 5.6 % Final    Comment:    (NOTE) Pre diabetes:          5.7%-6.4%  Diabetes:              >6.4%  Glycemic control for   <7.0% adults with diabetes     CBG: No results for input(s): "GLUCAP" in the last 168 hours.  Review of Systems:   Negative except as listed in HPI and POC.  Past Medical History:  He,  has a past medical history of Acute exacerbation of CHF (congestive heart failure) (HCC) (12/15/2019), Acute heart failure (HCC) (12/16/2019), Angio-edema, Atrial fibrillation and flutter (HCC) (08/17/2015), Chronic diastolic CHF (congestive heart failure) (HCC) (08/30/2015), Dependence on continuous supplemental oxygen, Hematuria  (01/10/2021), Hepatic fibrosis (02/29/2016), Hepatitis C, chronic (HCC) (08/19/2015), Hepatitis C, chronic (HCC) (08/19/2015), History of cardiac catheterization, Hypertension, Hyposmia (12/22/2017), OSA (obstructive sleep apnea) (11/21/2015), Pleural effusion on right (03/14/2018), Sleep apnea, and Tobacco abuse.   Surgical History:   Past Surgical History:  Procedure Laterality Date   CARDIAC CATHETERIZATION N/A 08/21/2015   Procedure: Right/Left Heart Cath and Coronary Angiography;  Surgeon: Marykay Lex, MD;  Location: [facility] INVASIVE CV LAB;  Service: Cardiovascular;  Laterality: N/A;   CARDIOVERSION N/A 09/22/2015   Procedure: CARDIOVERSION;  Surgeon: Wendall Stade, MD;  Location: [facility] ENDOSCOPY;  Service: Cardiovascular;  Laterality: N/A;   COLONOSCOPY N/A 07/19/2016   Procedure: COLONOSCOPY;  Surgeon: Sherrilyn Rist, MD;  Location: [facility] ENDOSCOPY;  Service: Gastroenterology;  Laterality: N/A;   ESOPHAGOGASTRODUODENOSCOPY N/A 07/19/2016   Procedure: ESOPHAGOGASTRODUODENOSCOPY (EGD);  Surgeon: Sherrilyn Rist, MD;  Location: [facility] ENDOSCOPY;  Service: Gastroenterology;  Laterality: N/A;   IR THORACENTESIS ASP PLEURAL SPACE W/IMG GUIDE  07/09/2019   NO PAST SURGERIES     SINUS ENDO WITH FUSION N/A 10/02/2018   Procedure: SINUS ENDO WITH FUSION;  Surgeon: Suzanna Obey, MD;  Location: [facility] OR;  Service: ENT;  Laterality: N/A;     Social History:   reports that he has been smoking cigarettes. He has been exposed to tobacco smoke. He has never used smokeless tobacco. He reports current alcohol use of about 5.0 standard drinks of alcohol per week. He reports that he does not use drugs.   Family History:  His family history includes Allergic rhinitis in his mother; Asthma in his mother; Diabetes in his mother; Hypertension in his maternal grandfather and mother; Pneumonia in his father. There is no history of Colon cancer.   Allergies Allergies  Allergen Reactions   Lactose Intolerance (Gi) Nausea And  Vomiting     Home Medications  Prior to Admission medications   Medication Sig Start Date End Date Taking? Authorizing Provider  albuterol (VENTOLIN HFA) 108 (90 Base) MCG/ACT inhaler Inhale 2 puffs into the lungs every 6 (six) hours as needed for wheezing or shortness of breath. 05/23/20   Seawell, Jaimie A, DO  ANORO ELLIPTA 62.5-25 MCG/ACT AEPB INHALE 1 PUFF INTO THE LUNGS DAILY 05/07/22   Nooruddin, Jason Fila, MD  azelastine (ASTELIN) 0.1 % nasal spray USE 2 SPRAYS IN EACH NOSTRIL TWICE DAILY 04/16/22   Steffanie Rainwater, MD  dapagliflozin propanediol (FARXIGA) 10 MG TABS tablet TAKE 1 TABLET(10 MG) BY MOUTH DAILY BEFORE BREAKFAST 08/20/22   Nooruddin, [place],  MD  diltiazem (CARDIZEM CD) 240 MG 24 hr capsule TAKE 2 CAPSULES(480 MG) BY MOUTH DAILY 11/22/21   Evlyn Kanner, MD  ferrous sulfate 325 (65 FE) MG tablet Take 1 tablet (325 mg total) by mouth every Monday, Wednesday, and Friday. 08/28/22 08/28/23  Nooruddin, Jason Fila, MD  furosemide (LASIX) 80 MG tablet TAKE 1 TABLET(80 MG) BY MOUTH TWICE DAILY 12/20/21   Rocky Morel, DO  levocetirizine (XYZAL) 5 MG tablet Take 1 tablet (5 mg total) by mouth every evening. 09/13/22   Padgett, Pilar Grammes, MD  metolazone (ZAROXOLYN) 2.5 MG tablet Take 1 tablet (2.5 mg total) by mouth once a week. Take every Wednesday. 08/20/22   Nooruddin, Jason Fila, MD  pantoprazole (PROTONIX) 20 MG tablet Take 1 tablet (20 mg total) by mouth daily. 08/20/22 08/15/23  Nooruddin, Jason Fila, MD  potassium chloride SA (KLOR-CON M) 20 MEQ tablet Take 2 tablets (40 mEq total) by mouth 2 (two) times daily. 07/26/22 10/24/22  Doran Stabler, DO  predniSONE (DELTASONE) 20 MG tablet Take 2 tablets (40 mg total) by mouth daily with breakfast. 07/16/22   Merrilyn Puma, MD  triamcinolone (NASACORT) 55 MCG/ACT AERO nasal inhaler 2 sprays each nostril daily for 1-2 weeks at a time before stopping once nasal congestion improves for best benefit. 09/13/22   Padgett, Pilar Grammes, MD  XARELTO 20 MG TABS tablet  TAKE 1 TABLET(20 MG) BY MOUTH DAILY WITH SUPPER 08/19/22   Nooruddin, Jason Fila, MD     Critical care time: 74 minutes    Janeece Riggers, AGACNP-BC [facility] For pager details, please see AMION or use EPIC chat After 1900, please call [facility] for cross coverage needs 10/01/2022 4:03 PM

## 2022-10-01 NOTE — Op Note (Signed)
Operative Note  Preoperative diagnosis:  1.  Right ureteral calculus 2.  Sepsis secondary to UTI  Postoperative diagnosis: 1.  Same  Procedure(s): 1.  Cystoscopy with right retrograde pyelogram, right ureteral stent placement  Surgeon: Modena Slater, MD  Assistants: None  Anesthesia: General  Complications: None immediate  EBL: Minimal  Specimens: 1.  Right renal pelvis culture  Drains/Catheters: 1.  6 x 26 double-J ureteral stent 2.  20 French coud catheter  Intraoperative findings: 1.  Normal anterior urethra 2.  High riding bladder neck with moderately obstructing prostate 3.  Bladder mucosa without any tumors or masses 4.  Right retrograde pyelogram revealed a filling defect at the level of the renal pelvis with upstream hydronephrosis.  After relief of obstruction there was brisk efflux of large amount of purulent fluid  Indication: 62 year old male presented with sepsis found to have a right ureteral calculus.  He presents for the previously mentioned operation.  Description of procedure:  The patient was identified and consent was obtained.  The patient was taken to the operating room and placed in the supine position.  The patient was placed under general anesthesia.  Perioperative antibiotics were administered.  The patient was placed in dorsal lithotomy.  Patient was prepped and draped in a standard sterile fashion and a timeout was performed.  A 21 French rigid cystoscope was advanced into the urethra and into the bladder.  Complete cystoscopy was performed with findings noted above.  The right ureter was cannulated with a open-ended ureteral catheter and a retrograde pyelogram was performed with findings noted above.  I passed a sensor wire up the ureter and passed the stone.  I passed the open-ended ureteral catheter over the wire and passed the stone and remove the wire.  I shot a retrograde pyelogram.  It was evident that the open-ended ureteral catheter was not in  the kidney indicating the ureter was back walled.  Therefore I withdrew the open-ended ureteral catheter distal to the stone.  I used a Glidewire and was able to navigate this past the stone and into the kidney.  The wire was clearly in the upper pole.  I passed the open-ended ureteral catheter over the wire and this passed easily into the kidney.  I withdrew the wire.  I obtained fluid for culture.  I shot a retrograde pyelogram and confirmed that the open-ended ureteral catheter was in the kidney.  I reintroduced a sensor wire through the open-ended ureteral catheter and into the kidney and then withdrew the open-ended ureteral catheter.  I then placed a 6 x 26 double-J ureteral stent in a standard fashion followed by removal of the wire.  Fluoroscopy confirmed proximal placement with a good coil in the upper pole of the kidney and direct visualization confirmed a good coil in the bladder.  There continues to be brisk efflux of large amount of purulent fluid.  I withdrew the scope and placed a 20 [place] coud catheter.  This concluded the operation.  Patient tolerated the procedure well was guarded postoperatively.  He remained intubated and transferred to the ICU.  Plan: Patient will go under ICU care.  Urology will be available as necessary and will follow.

## 2022-10-01 NOTE — ED Triage Notes (Signed)
Per [facility] pt coming from home- family called ems stating patient c/o shortness of breath and weakness since yesterday. Hx of COPD on 3L [place] at home. Patient soiled in urine to point dripping on floor. Patient states he has been too weak to make it to the bathroom.

## 2022-10-01 NOTE — ED Notes (Signed)
Critical lactic 2.5 provider  informed

## 2022-10-01 NOTE — Discharge Instructions (Signed)

## 2022-10-01 NOTE — Progress Notes (Signed)
Pt arrived back to ICU post OR cystoscopy with right retrograde pyelogram and right ureteral stent.   Pt remains intubated post procedure on propofol, phenylephrine and IVF with 278ml/hr NS. PAD with propofol and fentanyl ordered overnight with RASS goal 0. On PRVC 18 RR, 600 TV, 5 PEEP with FIO2 100%. ABG pending  Acute on Chronic Respiratory Failure -continue Anoro Ellipta and Albuterol  -returned from OR intubated -ABX as below -CXR and ABG pending   Septic Shock secondary to Pyelonephritis In operating room patient was found to have pus in kidney and fluid was sent for culture.  -Continue IV abx therapy with Vanc/zosyn  -Foley cath in place with adequate exam.  -Wean pressors MAP goal greater than 65 -IVF    Metabolic Encephalopathy  -wean sedation for RASS goal 0  Additional Critical Care Time: 15 minutes  Janeece Riggers, AGACNP-BC [facility] For pager details, please see AMION or use EPIC chat After 1900, please call [facility] for cross coverage needs 10/01/2022 6:38 PM

## 2022-10-01 NOTE — ED Provider Notes (Signed)
[facility] AT [facility] [place] Provider Note   CSN: [id number] Arrival date & time: 10/01/22  1023     History  Chief Complaint  Patient presents with   Weakness   Shortness of Breath    Frank Morales is a 62 y.o. male.   Weakness Associated symptoms: abdominal pain, cough, dysuria and shortness of breath   Shortness of Breath Associated symptoms: abdominal pain and cough   Patient presents for generalized weakness.  He states that this has been ongoing for the past several days.  He has been too weak to walk.  He has history of COPD and CHF.  He is on 3 L of oxygen at baseline.  He states that his shortness of breath has worsened.  He has had a cough which he states is chronic.  He has had dysuria and lower abdominal pain.  He denies any other current areas of pain.  He denies any vomiting or diarrhea.     Home Medications Prior to Admission medications   Medication Sig Start Date End Date Taking? Authorizing Provider  albuterol (VENTOLIN HFA) 108 (90 Base) MCG/ACT inhaler Inhale 2 puffs into the lungs every 6 (six) hours as needed for wheezing or shortness of breath. 05/23/20   Seawell, Jaimie A, DO  ANORO ELLIPTA 62.5-25 MCG/ACT AEPB INHALE 1 PUFF INTO THE LUNGS DAILY 05/07/22   Nooruddin, Jason Fila, MD  azelastine (ASTELIN) 0.1 % nasal spray USE 2 SPRAYS IN EACH NOSTRIL TWICE DAILY 04/16/22   Steffanie Rainwater, MD  dapagliflozin propanediol (FARXIGA) 10 MG TABS tablet TAKE 1 TABLET(10 MG) BY MOUTH DAILY BEFORE BREAKFAST 08/20/22   Nooruddin, Jason Fila, MD  diltiazem (CARDIZEM CD) 240 MG 24 hr capsule TAKE 2 CAPSULES(480 MG) BY MOUTH DAILY 11/22/21   Evlyn Kanner, MD  ferrous sulfate 325 (65 FE) MG tablet Take 1 tablet (325 mg total) by mouth every Monday, Wednesday, and Friday. 08/28/22 08/28/23  Nooruddin, Jason Fila, MD  furosemide (LASIX) 80 MG tablet TAKE 1 TABLET(80 MG) BY MOUTH TWICE DAILY 12/20/21   Rocky Morel, DO  levocetirizine (XYZAL) 5 MG tablet Take  1 tablet (5 mg total) by mouth every evening. 09/13/22   Padgett, Pilar Grammes, MD  metolazone (ZAROXOLYN) 2.5 MG tablet Take 1 tablet (2.5 mg total) by mouth once a week. Take every Wednesday. 08/20/22   Nooruddin, Jason Fila, MD  pantoprazole (PROTONIX) 20 MG tablet Take 1 tablet (20 mg total) by mouth daily. 08/20/22 08/15/23  Nooruddin, Jason Fila, MD  potassium chloride SA (KLOR-CON M) 20 MEQ tablet Take 2 tablets (40 mEq total) by mouth 2 (two) times daily. 07/26/22 10/24/22  Doran Stabler, DO  predniSONE (DELTASONE) 20 MG tablet Take 2 tablets (40 mg total) by mouth daily with breakfast. 07/16/22   Merrilyn Puma, MD  triamcinolone (NASACORT) 55 MCG/ACT AERO nasal inhaler 2 sprays each nostril daily for 1-2 weeks at a time before stopping once nasal congestion improves for best benefit. 09/13/22   Padgett, Pilar Grammes, MD  XARELTO 20 MG TABS tablet TAKE 1 TABLET(20 MG) BY MOUTH DAILY WITH SUPPER 08/19/22   Nooruddin, Jason Fila, MD      Allergies    Lactose intolerance (gi)    Review of Systems   Review of Systems  Constitutional:  Positive for activity change, appetite change and fatigue.  Respiratory:  Positive for cough and shortness of breath.   Gastrointestinal:  Positive for abdominal pain.  Genitourinary:  Positive for dysuria.  Neurological:  Positive for weakness (Generalized).  All  other systems reviewed and are negative.   Physical Exam Updated Vital Signs BP 127/74   Pulse 95   Temp (!) 100.9 F (38.3 C) (Oral)   Resp 17   Ht 5\' 11"  (1.803 m)   Wt (!) 142.9 kg   SpO2 100%   BMI 43.93 kg/m  Physical Exam Vitals and nursing note reviewed.  Constitutional:      General: He is not in acute distress.    Appearance: He is well-developed. He is obese. He is ill-appearing. He is not toxic-appearing or diaphoretic.  HENT:     Head: Normocephalic and atraumatic.     Mouth/Throat:     Mouth: Mucous membranes are moist.  Eyes:     Conjunctiva/sclera: Conjunctivae normal.  Cardiovascular:      Rate and Rhythm: Tachycardia present. Rhythm irregular.     Heart sounds: No murmur heard. Pulmonary:     Effort: Pulmonary effort is normal. Tachypnea and prolonged expiration present. No accessory muscle usage or respiratory distress.     Breath sounds: Rhonchi present.  Chest:     Chest wall: No tenderness.  Abdominal:     Palpations: Abdomen is soft.     Tenderness: There is abdominal tenderness.  Musculoskeletal:        General: No swelling.     Cervical back: Normal range of motion and neck supple.     Right lower leg: Edema present.     Left lower leg: Edema present.  Skin:    General: Skin is warm and dry.     Capillary Refill: Capillary refill takes less than 2 seconds.     Coloration: Skin is not cyanotic or pale.  Neurological:     General: No focal deficit present.     Mental Status: He is alert and oriented to person, place, and time.  Psychiatric:        Mood and Affect: Mood normal.        Behavior: Behavior normal.     ED Results / Procedures / Treatments   Labs (all labs ordered are listed, but only abnormal results are displayed) Labs Reviewed  CBC WITH DIFFERENTIAL/PLATELET - Abnormal; Notable for the following components:      Result Value   WBC 11.8 (*)    RBC 3.90 (*)    Hemoglobin 10.3 (*)    HCT 32.1 (*)    RDW 22.2 (*)    Neutro Abs 9.2 (*)    Monocytes Absolute 1.7 (*)    Abs Immature Granulocytes 0.09 (*)    All other components within normal limits  BASIC METABOLIC PANEL - Abnormal; Notable for the following components:   Sodium 132 (*)    Potassium 3.2 (*)    Chloride 91 (*)    Glucose, Bld 128 (*)    BUN 27 (*)    Creatinine, Ser 1.97 (*)    Calcium 8.4 (*)    GFR, Estimated 38 (*)    All other components within normal limits  BRAIN NATRIURETIC PEPTIDE - Abnormal; Notable for the following components:   B Natriuretic Peptide 275.3 (*)    All other components within normal limits  LACTIC ACID, PLASMA - Abnormal; Notable for the  following components:   Lactic Acid, Venous 2.5 (*)    All other components within normal limits  PROTIME-INR - Abnormal; Notable for the following components:   Prothrombin Time 28.4 (*)    INR 2.6 (*)    All other components within normal limits  URINALYSIS,  W/ REFLEX TO CULTURE (INFECTION SUSPECTED) - Abnormal; Notable for the following components:   APPearance HAZY (*)    Glucose, UA 150 (*)    Hgb urine dipstick MODERATE (*)    Protein, ur 100 (*)    Leukocytes,Ua SMALL (*)    Bacteria, UA FEW (*)    All other components within normal limits  HEPATIC FUNCTION PANEL - Abnormal; Notable for the following components:   Albumin 3.0 (*)    AST 58 (*)    Total Bilirubin 1.4 (*)    Bilirubin, Direct 0.4 (*)    Indirect Bilirubin 1.0 (*)    All other components within normal limits  GLUCOSE, CAPILLARY - Abnormal; Notable for the following components:   Glucose-Capillary 153 (*)    All other components within normal limits  TROPONIN I (HIGH SENSITIVITY) - Abnormal; Notable for the following components:   Troponin I (High Sensitivity) 37 (*)    All other components within normal limits  RESP PANEL BY RT-PCR (RSV, FLU A&B, COVID)  RVPGX2  CULTURE, BLOOD (ROUTINE X 2)  CULTURE, BLOOD (ROUTINE X 2)  URINE CULTURE  LACTIC ACID, PLASMA  HIV ANTIBODY (ROUTINE TESTING W REFLEX)  PROCALCITONIN  OSMOLALITY  TROPONIN I (HIGH SENSITIVITY)    EKG EKG Interpretation Date/Time:  Tuesday October 01 2022 10:57:46 EDT Ventricular Rate:  121 PR Interval:    QRS Duration:  128 QT Interval:  338 QTC Calculation: 480 R Axis:   113  Text Interpretation: Atrial fibrillation IVCD, consider atypical RBBB Confirmed by Gloris Manchester ([id number]) on 10/01/2022 11:14:46 AM  Radiology CT CHEST ABDOMEN PELVIS WO CONTRAST  Result Date: 10/01/2022 CLINICAL DATA:  Sepsis, shortness of breath EXAM: CT CHEST, ABDOMEN AND PELVIS WITHOUT CONTRAST TECHNIQUE: Multidetector CT imaging of the chest, abdomen and pelvis was  performed following the standard protocol without IV contrast. RADIATION DOSE REDUCTION: This exam was performed according to the departmental dose-optimization program which includes automated exposure control, adjustment of the mA and/or kV according to patient size and/or use of iterative reconstruction technique. COMPARISON:  05/16/2021 FINDINGS: CT CHEST FINDINGS Cardiovascular: Heart is enlarged in size. There are scattered coronary artery calcifications. There is no evidence of pericardial effusion. Mediastinum/Nodes: There are few slightly enlarged lymph nodes in mediastinum. Lungs/Pleura: There are patchy infiltrates in both lower lung fields, more so on the right side suggesting atelectasis/pneumonia. There is interval worsening of this finding. There is 3.2 cm lobular density in the lateral aspect of right lower lung field. Small right pleural effusion is seen. Rest of the lung fields show no focal infiltrates. There is no pneumothorax.There are multiple blebs and bullae in the upper lung fields, more so on the right side. Musculoskeletal: No acute findings are seen. CT ABDOMEN PELVIS FINDINGS Hepatobiliary: No focal abnormalities are seen in liver. There is no dilation of bile ducts. Gallbladder is unremarkable. Pancreas: No focal abnormalities are seen. Spleen: Unremarkable. Adrenals/Urinary Tract: Adrenals are unremarkable. There is interval appearance of marked right hydronephrosis. There is 1.4 cm calculus at the right ureteropelvic junction. There are multiple other bilateral renal stones, more so in the right kidney. Largest other renal stones measures 3.1 cm in maximum diameter in the midportion of right kidney. There is significant right perinephric stranding. There are no loculated fluid collections. Urinary bladder is unremarkable. Stomach/Bowel: Stomach is unremarkable. Small bowel loops are not dilated. Appendix is difficult to visualize right perinephric stranding is extending to the right  iliac fossa there is no significant wall thickening in colon.  Vascular/Lymphatic: Arterial calcifications are seen in the aorta and its major branches. Reproductive: There are calcifications in prostate. Other: There is no ascites or pneumoperitoneum. Small umbilical hernia containing fat is seen. The small ventral hernia containing short segment of a small bowel loop superior to the umbilicus. Musculoskeletal: Degenerative changes are noted in lumbar spine. There is minimal anterolisthesis at the L4-L5 level. There is spinal stenosis at L4-L5 level. There is encroachment of neural foramina at multiple levels in lumbar spine. IMPRESSION: There is marked right hydronephrosis caused by 1.4 cm calculus at the right ureteropelvic junction. Multiple bilateral renal stones. There is marked right perinephric stranding which may be due to high-grade obstruction at the right ureteropelvic junction or superimposed pyelonephritis. There is no loculated perinephric fluid collection. There are patchy infiltrates in both lower lung fields, more so on the right side suggesting atelectasis/pneumonia. Small right pleural effusion. COPD with multiple blebs and bullae in the apices, more so on the right side. There is no evidence of intestinal obstruction or pneumoperitoneum. Aortic arteriosclerosis. Lumbar spondylosis. There is small ventral hernia containing short segment of small bowel loop in upper abdomen slightly superior to the umbilicus without evidence of obstruction or incarceration. Imaging findings of high-grade obstruction at the right ureteropelvic junction and possible acute pyelonephritis in right kidney were relayed to patient's provider Dr. Durwin Nora by telephone call. Electronically Signed   By: Ernie Avena M.D.   On: 10/01/2022 15:22   DG Chest Portable 1 View  Result Date: 10/01/2022 CLINICAL DATA:  Difficulty breathing EXAM: PORTABLE CHEST 1 VIEW COMPARISON:  Previous studies including the examination  done earlier today FINDINGS: Transverse diameter of heart is increased. Central pulmonary vessels are prominent. Small bilateral pleural effusions are seen, more so on the right side. No significant interval changes are noted. IMPRESSION: Cardiomegaly. Central pulmonary vessels are prominent suggesting CHF. Small bilateral pleural effusions. Electronically Signed   By: Ernie Avena M.D.   On: 10/01/2022 14:56   DG Chest Portable 1 View  Result Date: 10/01/2022 CLINICAL DATA:  Shortness of breath EXAM: PORTABLE CHEST 1 VIEW COMPARISON:  09/09/2021 FINDINGS: Transverse diameter of heart is increased. Central pulmonary vessels are prominent. There are no signs of alveolar pulmonary edema. There is no focal pulmonary consolidation. Small bilateral pleural effusions are seen. There is no pneumothorax. IMPRESSION: Cardiomegaly. Possibility of pericardial effusion is not excluded. Central pulmonary vessels are prominent suggesting mild CHF. There are no signs of alveolar pulmonary edema or focal pulmonary consolidation. Small bilateral pleural effusions. Electronically Signed   By: Ernie Avena M.D.   On: 10/01/2022 12:26    Procedures Procedures    Medications Ordered in ED Medications  0.9 %  sodium chloride infusion ( Intravenous Continued from Pre-op 10/01/22 1635)  norepinephrine (LEVOPHED) 4mg  in (0.016 mg/mL) premix infusion (2 mcg/min Intravenous Continued from Pre-op 10/01/22 1635)  docusate sodium (COLACE) capsule 100 mg ( Oral MAR Hold 10/01/22 1610)  polyethylene glycol (MIRALAX / GLYCOLAX) packet 17 g ( Oral MAR Hold 10/01/22 1610)  ondansetron (ZOFRAN) injection 4 mg ( Intravenous MAR Hold 10/01/22 1610)  albuterol (PROVENTIL) (2.5 MG/3ML) 0.083% nebulizer solution 3 mL ( Inhalation MAR Hold 10/01/22 1610)  umeclidinium-vilanterol (ANORO ELLIPTA) 62.5-25 MCG/ACT 1 puff ( Inhalation Automatically Held 10/10/22 1000)  0.9 % irrigation (POUR BTL) (1,000 mLs Irrigation Given  10/01/22 1630)  iohexol (OMNIPAQUE) 300 MG/ML solution (10 mLs  Given 10/01/22 1635)  acetaminophen (TYLENOL) tablet 650 mg (650 mg Oral Given 10/01/22 1055)  sodium chloride 0.9 %  bolus 1,000 mL (0 mLs Intravenous Stopped 10/01/22 1156)  ceFEPIme (MAXIPIME) 2 g in sodium chloride 0.9 % 100 mL IVPB (0 g Intravenous Stopped 10/01/22 1147)  metroNIDAZOLE (FLAGYL) IVPB 500 mg (0 mg Intravenous Stopped 10/01/22 1207)  vancomycin (VANCOREADY) IVPB 2000 mg/400 mL (0 mg Intravenous Stopped 10/01/22 1426)  methylPREDNISolone sodium succinate (SOLU-MEDROL) 125 mg/2 mL injection 125 mg (125 mg Intravenous Given 10/01/22 1113)  ipratropium-albuterol (DUONEB) 0.5-2.5 (3) MG/3ML nebulizer solution 3 mL (3 mLs Nebulization Given 10/01/22 1118)  sodium chloride 0.9 % bolus 1,000 mL (0 mLs Intravenous Stopped 10/01/22 1405)    ED Course/ Medical Decision Making/ A&P                             Medical Decision Making Amount and/or Complexity of Data Reviewed Labs: ordered. Radiology: ordered. ECG/medicine tests: ordered.  Risk OTC drugs. Prescription drug management. Decision regarding hospitalization.   This patient presents to the ED for concern of generalized weakness, this involves an extensive number of treatment options, and is a complaint that carries with it a high risk of complications and morbidity.  The differential diagnosis includes infection, dehydration, deconditioning, metabolic arrangement, anemia   Co morbidities that complicate the patient evaluation  HTN, CHF, atrial fibrillation, COPD, anemia, obesity, prediabetes   Additional history obtained:  Additional history obtained from EMS External records from outside source obtained and reviewed including EMR   Lab Tests:  I Ordered, and personally interpreted labs.  The pertinent results include: Leukocytosis and lactic acidosis are present consistent with sepsis.  Urinalysis shows evidence of infection.  AKI is present.  Anemia is  baseline.   Imaging Studies ordered:  I ordered imaging studies including chest x-ray, CT of chest, abdomen, pelvis I independently visualized and interpreted imaging which showed 1.4 cm obstructive stone in the right UPJ with large right hydronephrosis; right perinephric stranding suggestive of pyelonephritis; bibasilar patchy infiltrates suggestive of pneumonia I agree with the radiologist interpretation   Cardiac Monitoring: / EKG:  The patient was maintained on a cardiac monitor.  I personally viewed and interpreted the cardiac monitored which showed an underlying rhythm of: Atrial fibrillation   Consultations Obtained:  I requested consultation with the neurologist, Dr. Alvester Morin,  and discussed lab and imaging findings as well as pertinent plan - they recommend: Will evaluate for possible ureteral stenting I requested consultation with the intensivist,  and discussed lab and imaging findings as well as pertinent plan - they recommend: Admission to ICU I requested consultation with the interventional radiologist,  and discussed lab and imaging findings as well as pertinent plan - they recommend: Given his elevated INR and unclear last dose of Xarelto, patient is a poor candidate for PCN tube placement.  Recommend urologic procedure or medical management for stabilization prior to reconsideration of PCN tube.   Problem List / ED Course / Critical interventions / Medication management  Patient presents for shortness of breath, dysuria, abdominal pain, generalized weakness, and fatigue over the past several days.  On arrival, patient is found to be febrile, tachycardic, and tachypneic.  He does meet SIRS criteria.  Septic workup and broad-spectrum antibiotics were initiated.  1 L bolus of IVF was ordered initially given his history of CHF and current shortness of breath.  On exam, patient has tachypnea and pursed lip breathing with prolonged expiration.  On lung auscultation, patient has diffuse  rhonchi.  COPD treatment was ordered.  He has a large  ventral hernia which he states is chronic.  He does have lower abdominal tenderness and does endorse recent dysuria.  Tylenol was given for antipyresis.  Lab work shows leukocytosis and lactic acidosis consistent with sepsis.  Per chart review, last echocardiogram was 2 months ago.  At the time, he had preserved LVEF with moderate concentric LVH.  He had moderate to mild atrial dilation.  Additional IV fluids were ordered.  Despite IV fluids, patient had downtrending blood pressures while in the ED.  He became hypotensive and Levophed was initiated.  He had some frothy sputum which raises concern for iatrogenic pulmonary edema.  Repeat chest x-ray does not show any evidence of this.  He remained on nasal cannula.  He also had worsening encephalopathy and persistent diaphoresis.  Patient remained normotensive on 2 mcg/min of Levophed.  CT imaging showed obstructive uropathy with large right hydronephrosis and perinephric stranding.  There are also findings of likely basilar pneumonia.  I spoke with urologist on-call, Dr. Alvester Morin, for consideration of stenting to relieve septic stone.  Dr. Alvester Morin to see in consult.  I also spoke with interventional radiology for consideration of PCN tube placement.  Currently, patient is not a good candidate for this given his elevated INR and use of Xarelto, although last dose of Xarelto was unclear.  They recommended medical management.  They will consider PCN tube placement if his INR gets below 1.5.  Patient was admitted to ICU for further management. I ordered medication including IV fluids and broad-spectrum antibiotics for sepsis; Tylenol for antipyresis; Solu-Medrol and DuoNeb for COPD; Levophed for hypotension/septic shock Reevaluation of the patient after these medicines showed that the patient stayed the same I have reviewed the patients home medicines and have made adjustments as needed   Social Determinants of  Health:  Has PCP   CRITICAL CARE Performed by: Gloris Manchester   Total critical care time: 45 minutes  Critical care time was exclusive of separately billable procedures and treating other patients.  Critical care was necessary to treat or prevent imminent or life-threatening deterioration.  Critical care was time spent personally by me on the following activities: development of treatment plan with patient and/or surrogate as well as nursing, discussions with consultants, evaluation of patient's response to treatment, examination of patient, obtaining history from patient or surrogate, ordering and performing treatments and interventions, ordering and review of laboratory studies, ordering and review of radiographic studies, pulse oximetry and re-evaluation of patient's condition.         Final Clinical Impression(s) / ED Diagnoses Final diagnoses:  Septic shock (HCC)  Obstructive uropathy  Pneumonia of both lower lobes due to infectious organism  AKI (acute kidney injury) [facility])    Rx / DC Orders ED Discharge Orders     None         Gloris Manchester, MD 10/01/22 1645

## 2022-10-02 ENCOUNTER — Encounter (HOSPITAL_COMMUNITY): Payer: Self-pay | Admitting: Urology

## 2022-10-02 DIAGNOSIS — Z1152 Encounter for screening for COVID-19: Secondary | ICD-10-CM | POA: Diagnosis not present

## 2022-10-02 DIAGNOSIS — J440 Chronic obstructive pulmonary disease with acute lower respiratory infection: Secondary | ICD-10-CM | POA: Diagnosis not present

## 2022-10-02 DIAGNOSIS — N12 Tubulo-interstitial nephritis, not specified as acute or chronic: Secondary | ICD-10-CM | POA: Diagnosis not present

## 2022-10-02 DIAGNOSIS — Z9911 Dependence on respirator [ventilator] status: Secondary | ICD-10-CM | POA: Diagnosis not present

## 2022-10-02 DIAGNOSIS — E8729 Other acidosis: Secondary | ICD-10-CM | POA: Diagnosis not present

## 2022-10-02 DIAGNOSIS — D638 Anemia in other chronic diseases classified elsewhere: Secondary | ICD-10-CM | POA: Diagnosis not present

## 2022-10-02 DIAGNOSIS — I4821 Permanent atrial fibrillation: Secondary | ICD-10-CM | POA: Diagnosis not present

## 2022-10-02 DIAGNOSIS — E662 Morbid (severe) obesity with alveolar hypoventilation: Secondary | ICD-10-CM | POA: Diagnosis not present

## 2022-10-02 DIAGNOSIS — R6521 Severe sepsis with septic shock: Secondary | ICD-10-CM | POA: Diagnosis not present

## 2022-10-02 DIAGNOSIS — Z7901 Long term (current) use of anticoagulants: Secondary | ICD-10-CM | POA: Diagnosis not present

## 2022-10-02 DIAGNOSIS — J189 Pneumonia, unspecified organism: Secondary | ICD-10-CM | POA: Diagnosis not present

## 2022-10-02 DIAGNOSIS — J9601 Acute respiratory failure with hypoxia: Secondary | ICD-10-CM | POA: Diagnosis not present

## 2022-10-02 DIAGNOSIS — E871 Hypo-osmolality and hyponatremia: Secondary | ICD-10-CM | POA: Diagnosis not present

## 2022-10-02 DIAGNOSIS — J9621 Acute and chronic respiratory failure with hypoxia: Secondary | ICD-10-CM | POA: Diagnosis not present

## 2022-10-02 DIAGNOSIS — G9341 Metabolic encephalopathy: Secondary | ICD-10-CM | POA: Diagnosis not present

## 2022-10-02 DIAGNOSIS — N132 Hydronephrosis with renal and ureteral calculous obstruction: Secondary | ICD-10-CM | POA: Diagnosis not present

## 2022-10-02 DIAGNOSIS — A419 Sepsis, unspecified organism: Secondary | ICD-10-CM | POA: Diagnosis not present

## 2022-10-02 LAB — CBC
HCT: 31.9 % — ABNORMAL LOW (ref 39.0–52.0)
Hemoglobin: 10.2 g/dL — ABNORMAL LOW (ref 13.0–17.0)
MCH: 26 pg (ref 26.0–34.0)
MCHC: 32 g/dL (ref 30.0–36.0)
MCV: 81.4 fL (ref 80.0–100.0)
Platelets: 172 10*3/uL (ref 150–400)
RBC: 3.92 MIL/uL — ABNORMAL LOW (ref 4.22–5.81)
RDW: 22.4 % — ABNORMAL HIGH (ref 11.5–15.5)
WBC: 10.6 10*3/uL — ABNORMAL HIGH (ref 4.0–10.5)
nRBC: 0 % (ref 0.0–0.2)

## 2022-10-02 LAB — GLUCOSE, CAPILLARY
Glucose-Capillary: 153 mg/dL — ABNORMAL HIGH (ref 70–99)
Glucose-Capillary: 137 mg/dL — ABNORMAL HIGH (ref 70–99)
Glucose-Capillary: 127 mg/dL — ABNORMAL HIGH (ref 70–99)
Glucose-Capillary: 113 mg/dL — ABNORMAL HIGH (ref 70–99)
Glucose-Capillary: 92 mg/dL (ref 70–99)
Glucose-Capillary: 92 mg/dL (ref 70–99)

## 2022-10-02 LAB — BASIC METABOLIC PANEL
Anion gap: 12 (ref 5–15)
BUN: 27 mg/dL — ABNORMAL HIGH (ref 6–20)
CO2: 25 mmol/L (ref 22–32)
Calcium: 8.1 mg/dL — ABNORMAL LOW (ref 8.9–10.3)
Chloride: 98 mmol/L (ref 98–111)
Creatinine, Ser: 1.29 mg/dL — ABNORMAL HIGH (ref 0.61–1.24)
GFR, Estimated: >60 mL/min (ref >60)
Glucose, Bld: 192 mg/dL — ABNORMAL HIGH (ref 70–99)
Potassium: 3.5 mmol/L (ref 3.5–5.1)
Sodium: 135 mmol/L (ref 135–145)

## 2022-10-02 LAB — PHOSPHORUS: Phosphorus: 3.8 mg/dL (ref 2.5–4.6)

## 2022-10-02 LAB — PROTIME-INR
INR: 2.1 — ABNORMAL HIGH (ref 0.8–1.2)
Prothrombin Time: 24.1 seconds — ABNORMAL HIGH (ref 11.4–15.2)

## 2022-10-02 LAB — URINE CULTURE: Culture: <10000 — AB

## 2022-10-02 LAB — MAGNESIUM: Magnesium: 2.6 mg/dL — ABNORMAL HIGH (ref 1.7–2.4)

## 2022-10-02 LAB — HEPARIN LEVEL (UNFRACTIONATED): Heparin Unfractionated: 0.85 IU/mL — ABNORMAL HIGH (ref 0.30–0.70)

## 2022-10-02 LAB — APTT: aPTT: 32 seconds (ref 24–36)

## 2022-10-02 MED ORDER — ARFORMOTEROL TARTRATE 15 MCG/2ML IN NEBU
15.0000 ug | INHALATION_SOLUTION | Freq: Two times a day (BID) | RESPIRATORY_TRACT | Status: DC
  Administered 2022-10-02 – 2022-10-06 (×8): 15 ug via RESPIRATORY_TRACT
  Filled 2022-10-02 (×9): qty 2

## 2022-10-02 MED ORDER — HEPARIN (PORCINE) 25000 UT/250ML-% IV SOLN
2100.0000 [IU]/h | INTRAVENOUS | Status: DC
  Administered 2022-10-02: 1500 [IU]/h via INTRAVENOUS
  Administered 2022-10-03: 2100 [IU]/h via INTRAVENOUS
  Filled 2022-10-02 (×2): qty 250

## 2022-10-02 MED ORDER — REVEFENACIN 175 MCG/3ML IN SOLN
175.0000 ug | Freq: Every day | RESPIRATORY_TRACT | Status: DC
  Administered 2022-10-02 – 2022-10-06 (×4): 175 ug via RESPIRATORY_TRACT
  Filled 2022-10-02 (×4): qty 3

## 2022-10-02 MED ORDER — SODIUM CHLORIDE 0.9 % IV SOLN: 250.0000 mL | INTRAVENOUS | Status: DC

## 2022-10-02 MED ORDER — ATROPINE SULFATE 1 MG/10ML IJ SOSY
1.0000 mg | PREFILLED_SYRINGE | INTRAMUSCULAR | Status: DC | PRN
  Filled 2022-10-02: qty 10

## 2022-10-02 MED ORDER — FERROUS SULFATE 325 (65 FE) MG PO TABS
325.0000 mg | ORAL_TABLET | ORAL | Status: DC
  Administered 2022-10-02 – 2022-10-06 (×3): 325 mg via ORAL
  Filled 2022-10-02 (×3): qty 1

## 2022-10-02 MED ORDER — ORAL CARE MOUTH RINSE: 15.0000 mL | OROMUCOSAL | Status: DC | PRN

## 2022-10-02 MED ORDER — POTASSIUM CHLORIDE 20 MEQ PO PACK
40.0000 meq | PACK | Freq: Once | ORAL | Status: AC
  Administered 2022-10-02: 40 meq
  Filled 2022-10-02: qty 2

## 2022-10-02 MED ORDER — VANCOMYCIN HCL IN DEXTROSE 1-5 GM/200ML-% IV SOLN
1000.0000 mg | Freq: Two times a day (BID) | INTRAVENOUS | Status: DC
  Administered 2022-10-02 – 2022-10-03 (×3): 1000 mg via INTRAVENOUS
  Filled 2022-10-02 (×3): qty 200

## 2022-10-02 MED ORDER — INSULIN ASPART 100 UNIT/ML IJ SOLN
1.0000 [IU] | INTRAMUSCULAR | Status: DC
  Administered 2022-10-02: 1 [IU] via SUBCUTANEOUS

## 2022-10-02 MED ORDER — NOREPINEPHRINE 4 MG/250ML-% IV SOLN: 2.0000 ug/min | INTRAVENOUS | Status: DC

## 2022-10-02 NOTE — Plan of Care (Signed)

## 2022-10-02 NOTE — Progress Notes (Signed)
Aggie Cosier RN states [facility] PIV no longer needed.Levo stopped.

## 2022-10-02 NOTE — Progress Notes (Addendum)
NAME:  Frank Morales, MRN:  [id number], DOB:  1960/04/13, LOS: 1 ADMISSION DATE:  10/01/2022, CONSULTATION DATE:  10/01/2022 REFERRING MD:  Durwin Nora, MD, CHIEF COMPLAINT:  AMS   History of Present Illness:  Frank Morales is a 62 year old male with PMH notable for CHF (EF 55-60%), COPD (baseline 3 L [place]), Chronic Hep C, OSA, Tobacco abuse, HTN, Afib/Flutter (Xarelto). Pt presented to [facility] ED generalized weakness, SOB, and dysuria. On interview and examination pt obtunded, awakening eyes only to firm sternal rub and noxious stimuli.  CT chest abdomen pelvis without contrast in the ED showed marked right hydronephrosis, multiple bilateral renal stones, and and right perinephric stranding which may be due to high-grade obstruction or superimposed pyelonephritis.  ED providers had reached out to urology service who plan to take patient emergently to the operating room for likely stent placement.  Patient was in septic shock secondary to above and was not responsive to fluid resuscitation -PCCM was consulted for admission .   Pertinent  Medical History  CHF (EF 55 to 60%), chronic hep C, OSA, tobacco abuse, hypertension, A-fib/flutter (Xarelto), COPD wears 3 L nasal cannula at baseline.   Significant Hospital Events: Including procedures, antibiotic start and stop dates in addition to other pertinent events   7/16-patient arrived to emergency department in septic shock with obstructive right kidney stone with hydronephrosis and right perinephric stranding, intubated for OR, cystoscopy with right retrograde pyelogram and right ureteral stent placement, started on Vancomycin and Zosyn  7/17 Off of levophed and plan to extubate   Interim History / Subjective:  Patient remained intubated after leaving the OR. He was able to be taken off of levophed and precedex overnight, but remains on fentanyl.   Objective   Blood pressure 91/65, pulse (!) 137, temperature 98.5 F (36.9 C), temperature source  Oral, resp. rate (!) 22, height 5\' 11"  (1.803 m), weight (!) 139 kg, SpO2 100%.    Vent Mode: PRVC FiO2 (%):  [40 %-100 %] 40 % Set Rate:  [18 bmp-22 bmp] 22 bmp Vt Set:  [600 mL] 600 mL PEEP:  [5 cmH20] 5 cmH20 Plateau Pressure:  [17 cmH20] 17 cmH20   Intake/Output Summary (Last 24 hours) at 10/02/2022 0756 Last data filed at 10/02/2022 0600 Gross per 24 hour  Intake 3945.07 ml  Output 2025 ml  Net 1920.07 ml   Filed Weights   10/01/22 1030 10/01/22 1224 10/02/22 0301  Weight: (!) 145.2 kg (!) 142.9 kg (!) 139 kg    Examination: Physical Exam Cardiovascular:     Rate and Rhythm: Normal rate and regular rhythm. Mild non pitting edema in BLE.  Pulmonary:     Breath sounds: diminished on the R lower lung field otherwise mild rhonci dispersed. On PRVC.  Abdominal:     General: Bowel sounds are normal.     Palpations: Abdomen is soft though mildly distended, non tender to palpation.  Skin:    Capillary Refill: Capillary refill takes less than 2 seconds.  Neurological:     Comments: Awake though slightly drowsy, responds to questions.   Resolved Hospital Problem list   Right hydronephrosis with obstructing renal calculi 1.4cm at right ureteropelvic junction s/p cytoscopy and ureteral stent placement   Lactic Acidosis  Hyponatremia  Hypokalemia  Assessment & Plan:  Septic shock secondary to Pyelonephritis  Acute kidney injury secondary to above Now s/p ureteral stent placement. Culture from purulent fluid collection in OR pending. Now off of levophed.  - Continue  IV vancomycin and zosyn - pharmacy to manage  -Blood and urine cultures sent- follow culture data - Monitor BP    Encephalopathic Secondary to Sepsis -Treat underlining cause -Delirium prevention measures - Avoid sedative medications   R Lung opacity  COPD- with Chronic Hypoxic Respiratory Failure CT scan 3.2 cm opacity in right lower lung. Concern for possible mass (risk factor with tobacco use), compared to  pneumonia. Pt wears 3L [place] at baseline.  - Continues to be on ventilator, but plan to extubate today.  -Continue home Anoro Ellipta and Albuterol  -pulm toileting    Chronic Diastolic HF Echocardiogram 08/08/2022 showed EF 55-60%  -restart home medications when appropriate- Cardizem, lasix, metolazone   Afib/Flutter Chronically Anticoagulated Coagulapathy  Hr controlled in low 60s. Remains in atrial fibrillation.  -Hold home xarelto, starting heparin gtt  -Tele monitoring  Prediabetes  A1c 10 months ago 5.6. Patient was on farxiga. Holding in the setting of UTI and pyelonephritis.  - Shared decision making to restart farxiga once patient has improved, with risk of UTI; however, UTI in this case more likely due to obstructing stone than farxiga.  - SSI   Anemia  Hgb 10.2. Most likely anemia of chronic disease secondary to CHF. However, could also have some small acute blood loss exacerbating Hgb level after OR yesterday.  - Continue to monitor CBC  - Restart home po iron   Best Practice (right click and "Reselect all SmartList Selections" daily)   Diet/type: NPO DVT prophylaxis: SCD GI prophylaxis: N/A Lines: N/A Foley:  N/A Code Status:  full code Last date of multidisciplinary goals of care discussion [10/02/22]  Labs   CBC: Recent Labs  Lab 10/01/22 1041 10/01/22 1838 10/02/22 0146  WBC 11.8*  --  10.6*  NEUTROABS 9.2*  --   --   HGB 10.3* 11.6* 10.2*  HCT 32.1* 34.0* 31.9*  MCV 82.3  --  81.4  PLT 212  --  172    Basic Metabolic Panel: Recent Labs  Lab 10/01/22 1041 10/01/22 1811 10/01/22 1838 10/02/22 0146  NA 132*  --  135 135  K 3.2*  --  2.8* 3.5  CL 91*  --   --  98  CO2 29  --   --  25  GLUCOSE 128*  --   --  192*  BUN 27*  --   --  27*  CREATININE 1.97*  --   --  1.29*  CALCIUM 8.4*  --   --  8.1*  MG  --  2.4  --  2.6*  PHOS  --  4.1  --  3.8   GFR: Estimated Creatinine Clearance: 86.8 mL/min (A) (by C-G formula based on SCr of 1.29  mg/dL (H)). Recent Labs  Lab 10/01/22 1034 10/01/22 1041 10/01/22 1234 10/01/22 1811 10/02/22 0146  PROCALCITON  --   --   --  3.90  --   WBC  --  11.8*  --   --  10.6*  LATICACIDVEN 2.5*  --  1.0  --   --     Liver Function Tests: Recent Labs  Lab 10/01/22 1041  AST 58*  ALT 23  ALKPHOS 47  BILITOT 1.4*  PROT 7.7  ALBUMIN 3.0*   No results for input(s): "LIPASE", "AMYLASE" in the last 168 hours. No results for input(s): "AMMONIA" in the last 168 hours.  ABG    Component Value Date/Time   PHART 7.323 (L) 10/01/2022 1838   PCO2ART 54.3 (H) 10/01/2022 1838  PO2ART 483 (H) 10/01/2022 1838   HCO3 28.1 (H) 10/01/2022 1838   TCO2 30 10/01/2022 1838   O2SAT 100 10/01/2022 1838     Coagulation Profile: Recent Labs  Lab 10/01/22 1044 10/02/22 0146  INR 2.6* 2.1*    Cardiac Enzymes: No results for input(s): "CKTOTAL", "CKMB", "CKMBINDEX", "TROPONINI" in the last 168 hours.  HbA1C: Hemoglobin A1C  Date/Time Value Ref Range Status  12/04/2021 11:52 AM 5.6 4.0 - 5.6 % Final  04/04/2021 11:50 AM 5.7 (A) 4.0 - 5.6 % Final   Hgb A1c MFr Bld  Date/Time Value Ref Range Status  05/23/2020 11:23 AM 6.1 (H) 4.8 - 5.6 % Final    Comment:             Prediabetes: 5.7 - 6.4          Diabetes: >6.4          Glycemic control for adults with diabetes: <7.0   12/15/2019 07:08 PM 6.0 (H) 4.8 - 5.6 % Final    Comment:    (NOTE) Pre diabetes:          5.7%-6.4%  Diabetes:              >6.4%  Glycemic control for   <7.0% adults with diabetes     CBG: Recent Labs  Lab 10/01/22 1805 10/01/22 1946 10/01/22 2356 10/02/22 0352 10/02/22 0728  GLUCAP 172* 168* 180* 153* 137*    Review of Systems:   (-) except as listed in HPI and POC.   Past Medical History:  He,  has a past medical history of Acute exacerbation of CHF (congestive heart failure) (HCC) (12/15/2019), Acute heart failure (HCC) (12/16/2019), Angio-edema, Atrial fibrillation and flutter (HCC)  (08/17/2015), Chronic diastolic CHF (congestive heart failure) (HCC) (08/30/2015), Dependence on continuous supplemental oxygen, Hematuria (01/10/2021), Hepatic fibrosis (02/29/2016), Hepatitis C, chronic (HCC) (08/19/2015), Hepatitis C, chronic (HCC) (08/19/2015), History of cardiac catheterization, Hypertension, Hyposmia (12/22/2017), OSA (obstructive sleep apnea) (11/21/2015), Pleural effusion on right (03/14/2018), Sleep apnea, and Tobacco abuse.   Surgical History:   Past Surgical History:  Procedure Laterality Date   CARDIAC CATHETERIZATION N/A 08/21/2015   Procedure: Right/Left Heart Cath and Coronary Angiography;  Surgeon: Marykay Lex, MD;  Location: [facility] INVASIVE CV LAB;  Service: Cardiovascular;  Laterality: N/A;   CARDIOVERSION N/A 09/22/2015   Procedure: CARDIOVERSION;  Surgeon: Wendall Stade, MD;  Location: [facility] ENDOSCOPY;  Service: Cardiovascular;  Laterality: N/A;   COLONOSCOPY N/A 07/19/2016   Procedure: COLONOSCOPY;  Surgeon: Sherrilyn Rist, MD;  Location: [facility] ENDOSCOPY;  Service: Gastroenterology;  Laterality: N/A;   ESOPHAGOGASTRODUODENOSCOPY N/A 07/19/2016   Procedure: ESOPHAGOGASTRODUODENOSCOPY (EGD);  Surgeon: Sherrilyn Rist, MD;  Location: [facility] ENDOSCOPY;  Service: Gastroenterology;  Laterality: N/A;   IR THORACENTESIS ASP PLEURAL SPACE W/IMG GUIDE  07/09/2019   NO PAST SURGERIES     SINUS ENDO WITH FUSION N/A 10/02/2018   Procedure: SINUS ENDO WITH FUSION;  Surgeon: Suzanna Obey, MD;  Location: [facility] OR;  Service: ENT;  Laterality: N/A;     Social History:   reports that he has been smoking cigarettes. He has been exposed to tobacco smoke. He has never used smokeless tobacco. He reports current alcohol use of about 5.0 standard drinks of alcohol per week. He reports that he does not use drugs.   Family History:  His family history includes Allergic rhinitis in his mother; Asthma in his mother; Diabetes in his mother; Hypertension in his maternal grandfather and mother;  Pneumonia  in his father. There is no history of Colon cancer.   Allergies Allergies  Allergen Reactions   Lactose Intolerance (Gi) Nausea And Vomiting     Home Medications  Prior to Admission medications   Medication Sig Start Date End Date Taking? Authorizing Provider  albuterol (VENTOLIN HFA) 108 (90 Base) MCG/ACT inhaler Inhale 2 puffs into the lungs every 6 (six) hours as needed for wheezing or shortness of breath. 05/23/20   Seawell, Jaimie A, DO  ANORO ELLIPTA 62.5-25 MCG/ACT AEPB INHALE 1 PUFF INTO THE LUNGS DAILY 05/07/22   Nooruddin, Jason Fila, MD  azelastine (ASTELIN) 0.1 % nasal spray USE 2 SPRAYS IN EACH NOSTRIL TWICE DAILY 04/16/22   Steffanie Rainwater, MD  dapagliflozin propanediol (FARXIGA) 10 MG TABS tablet TAKE 1 TABLET(10 MG) BY MOUTH DAILY BEFORE BREAKFAST 08/20/22   Nooruddin, Jason Fila, MD  diltiazem (CARDIZEM CD) 240 MG 24 hr capsule TAKE 2 CAPSULES(480 MG) BY MOUTH DAILY 11/22/21   Evlyn Kanner, MD  ferrous sulfate 325 (65 FE) MG tablet Take 1 tablet (325 mg total) by mouth every Monday, Wednesday, and Friday. 08/28/22 08/28/23  Nooruddin, Jason Fila, MD  furosemide (LASIX) 80 MG tablet TAKE 1 TABLET(80 MG) BY MOUTH TWICE DAILY 12/20/21   Rocky Morel, DO  levocetirizine (XYZAL) 5 MG tablet Take 1 tablet (5 mg total) by mouth every evening. 09/13/22   Padgett, Pilar Grammes, MD  metolazone (ZAROXOLYN) 2.5 MG tablet Take 1 tablet (2.5 mg total) by mouth once a week. Take every Wednesday. 08/20/22   Nooruddin, Jason Fila, MD  pantoprazole (PROTONIX) 20 MG tablet Take 1 tablet (20 mg total) by mouth daily. 08/20/22 08/15/23  Nooruddin, Jason Fila, MD  potassium chloride SA (KLOR-CON M) 20 MEQ tablet Take 2 tablets (40 mEq total) by mouth 2 (two) times daily. 07/26/22 10/24/22  Doran Stabler, DO  predniSONE (DELTASONE) 20 MG tablet Take 2 tablets (40 mg total) by mouth daily with breakfast. 07/16/22   Merrilyn Puma, MD  triamcinolone (NASACORT) 55 MCG/ACT AERO nasal inhaler 2 sprays each nostril daily for 1-2  weeks at a time before stopping once nasal congestion improves for best benefit. 09/13/22   Marcelyn Bruins, MD  XARELTO 20 MG TABS tablet TAKE 1 TABLET(20 MG) BY MOUTH DAILY WITH SUPPER 08/19/22   Nooruddin, Jason Fila, MD     Lockie Mola  PGY-2 [place] [facility]

## 2022-10-02 NOTE — Anesthesia Postprocedure Evaluation (Signed)
Anesthesia Post Note  Patient: Frank Morales  Procedure(s) Performed: CYSTOSCOPY WITH URETEROSCOPY AND STENT PLACEMENT (Right: Ureter)     Patient location during evaluation: ICU Anesthesia Type: General Level of consciousness: patient remains intubated per anesthesia plan Pain management: pain level controlled Vital Signs Assessment: post-procedure vital signs reviewed and stable Respiratory status: patient on ventilator - see flowsheet for VS Cardiovascular status: blood pressure returned to baseline and stable Postop Assessment: no apparent nausea or vomiting Anesthetic complications: no   No notable events documented.  Last Vitals:  Vitals:   10/02/22 0723 10/02/22 0800  BP:  96/68  Pulse:  (!) 149  Resp:  (!) 22  Temp: 36.9 C   SpO2:  100%    Last Pain:  Vitals:   10/02/22 0723  TempSrc: Oral  PainSc:                  Lowella Curb

## 2022-10-02 NOTE — Evaluation (Signed)
Physical Therapy Evaluation Patient Details Name: Frank Morales MRN: [id number] DOB: 24-Jul-1960 Today's Date: 10/02/2022  History of Present Illness  62 y.o. male presents to [facility] hospital on 10/01/2022 with weakness, SOB, and dysuria. CT demonstrates R hydronephrosis, multiple bilateral ureteral stones, pyelonephritis. Pt underwent R ureteral stent placement on 7/16 remaining intubated post-procedure. Pt extubated 7/17.Marland Kitchen PMH includes CHF, hep C, OSA, HTN, afib, COPD.  Clinical Impression  Pt presents to PT with deficits in functional mobility, gait, balance, power, endurance. Pt is limited by increased work of breathing and fatigue when mobilizing, tiring quickly with short bout of ambulation. PT notes increased work of breathing with bed mobility as well, and provides frequent cues for pursed lip breathing. Pt will benefit from frequent mobilization in an effort to improve activity tolerance and pulmonary function. PT anticipates the pt will progress well if mobilized frequently.        Assistance Recommended at Discharge PRN  If plan is discharge home, recommend the following:  Can travel by private vehicle  A little help with bathing/dressing/bathroom;Assistance with cooking/housework;Assist for transportation;Help with stairs or ramp for entrance        Equipment Recommendations Rolling walker (2 wheels)  Recommendations for Other Services       Functional Status Assessment Patient has had a recent decline in their functional status and demonstrates the ability to make significant improvements in function in a reasonable and predictable amount of time.     Precautions / Restrictions Precautions Precautions: Fall Precaution Comments: monitor sats Restrictions Weight Bearing Restrictions: No      Mobility  Bed Mobility Overal bed mobility: Needs Assistance Bed Mobility: Supine to Sit, Sit to Supine     Supine to sit: Supervision, HOB elevated Sit to supine: Min guard    General bed mobility comments: increased time, use of railing    Transfers Overall transfer level: Needs assistance Equipment used: Rolling walker (2 wheels) Transfers: Sit to/from Stand Sit to Stand: Min guard                Ambulation/Gait Ambulation/Gait assistance: Min guard Gait Distance (Feet): 30 Feet Assistive device: Rolling walker (2 wheels) Gait Pattern/deviations: Step-through pattern Gait velocity: reduced Gait velocity interpretation: <1.8 ft/sec, indicate of risk for recurrent falls   General Gait Details: slowed step-through gait, one standing rest break, increased trunk flexion with fatigue  Stairs            Wheelchair Mobility     Tilt Bed    Modified Rankin (Stroke Patients Only)       Balance Overall balance assessment: Needs assistance Sitting-balance support: No upper extremity supported, Feet supported Sitting balance-Leahy Scale: Good     Standing balance support: Single extremity supported, Bilateral upper extremity supported, Reliant on assistive device for balance Standing balance-Leahy Scale: Poor                               Pertinent Vitals/Pain Pain Assessment Pain Assessment: No/denies pain    Home Living Family/patient expects to be discharged to:: Private residence Living Arrangements: Other relatives (cousins) Available Help at Discharge: Family;Available 24 hours/day Type of Home: House Home Access: Level entry       Home Layout: One level Home Equipment: Cane - single point      Prior Function Prior Level of Function : Independent/Modified Independent             Mobility Comments: pt reports PRN  use of SPC when feeling more tired or weak, otherwise ambulates without DME ADLs Comments: independent with ADLs     Hand Dominance        Extremity/Trunk Assessment   Upper Extremity Assessment Upper Extremity Assessment: Generalized weakness    Lower Extremity Assessment Lower  Extremity Assessment: Generalized weakness    Cervical / Trunk Assessment Cervical / Trunk Assessment: Normal  Communication   Communication: No difficulties  Cognition Arousal/Alertness: Awake/alert Behavior During Therapy: WFL for tasks assessed/performed Overall Cognitive Status: Within Functional Limits for tasks assessed                                          General Comments General comments (skin integrity, edema, etc.): pt on 3L [place] upon PT arrival with sats in mid 90s. When mobilizing pt demonstrates increased work of breathing and reports dyspnea. Pleth with a poor reading, due to uncertainty on sats PT increases supplemental oxygen to 5L. Pt recovers within ~2 minutes when not gripping walker and performing pursed lip breathing. Pt weaned back to 3L [place] when resting at end of session.    Exercises     Assessment/Plan    PT Assessment Patient needs continued PT services  PT Problem List Decreased strength;Decreased activity tolerance;Decreased balance;Decreased mobility;Decreased knowledge of use of DME;Cardiopulmonary status limiting activity       PT Treatment Interventions DME instruction;Gait training;Functional mobility training;Therapeutic activities;Therapeutic exercise;Balance training;Patient/family education    PT Goals (Current goals can be found in the Care Plan section)  Acute Rehab PT Goals Patient Stated Goal: to return to baseline PT Goal Formulation: With patient Time For Goal Achievement: 10/16/22 Potential to Achieve Goals: Good    Frequency Min 3X/week     Co-evaluation               AM-PAC PT "6 Clicks" Mobility  Outcome Measure Help needed turning from your back to your side while in a flat bed without using bedrails?: A Little Help needed moving from lying on your back to sitting on the side of a flat bed without using bedrails?: A Little Help needed moving to and from a bed to a chair (including a wheelchair)?: A  Little Help needed standing up from a chair using your arms (e.g., wheelchair or bedside chair)?: A Little Help needed to walk in hospital room?: A Little Help needed climbing 3-5 steps with a railing? : Total 6 Click Score: 16    End of Session Equipment Utilized During Treatment: Oxygen Activity Tolerance: Patient limited by fatigue Patient left: in bed;with call bell/phone within reach;with bed alarm set Nurse Communication: Mobility status PT Visit Diagnosis: Other abnormalities of gait and mobility (R26.89);Muscle weakness (generalized) (M62.81)    Time: 1308-6578 PT Time Calculation (min) (ACUTE ONLY): 30 min   Charges:   PT Evaluation $PT Eval Moderate Complexity: 1 Mod   PT General Charges $$ ACUTE PT VISIT: 1 Visit         Arlyss Gandy, PT, DPT Acute Rehabilitation Office [phone]   Arlyss Gandy 10/02/2022, 4:32 PM

## 2022-10-02 NOTE — Progress Notes (Addendum)
ANTICOAGULATION CONSULT NOTE - Initial Consult  Pharmacy Consult for heparin  Indication: atrial fibrillation  Allergies  Allergen Reactions   Lactose Intolerance (Gi) Nausea And Vomiting    Patient Measurements: Height: 5\' 11"  (180.3 cm) Weight: (!) 139 kg (306 lb 7 oz) IBW/kg (Calculated) : 75.3 Heparin Dosing Weight: 108.8kg   Vital Signs: Temp: 98.5 F (36.9 C) (07/17 0723) Temp Source: Oral (07/17 0723) BP: 96/68 (07/17 0800) Pulse Rate: 149 (07/17 0800)  Labs: Recent Labs    10/01/22 1041 10/01/22 1044 10/01/22 1409 10/01/22 1811 10/01/22 1838 10/02/22 0146  HGB 10.3*  --   --   --  11.6* 10.2*  HCT 32.1*  --   --   --  34.0* 31.9*  PLT 212  --   --   --   --  172  LABPROT  --  28.4*  --   --   --  24.1*  INR  --  2.6*  --   --   --  2.1*  CREATININE 1.97*  --   --   --   --  1.29*  TROPONINIHS  --   --  37* 24*  --   --     Estimated Creatinine Clearance: 86.8 mL/min (A) (by C-G formula based on SCr of 1.29 mg/dL (H)).   Medical History: Past Medical History:  Diagnosis Date   Acute exacerbation of CHF (congestive heart failure) (HCC) 12/15/2019   Acute heart failure (HCC) 12/16/2019   Angio-edema    Atrial fibrillation and flutter (HCC) 08/17/2015   A. S/p failed DCCV // b. Severe BAE on echo >> rate control strategy (has seen AF clinic) // c. Xarelto for anticoag (CHADS2-VASc=2 / CHF, HTN)   Chronic diastolic CHF (congestive heart failure) (HCC) 08/30/2015   A. Echo 6/17: Apical HK, moderate focal basal and mild concentric LVH, EF 50-55, diffuse HK, trivial MR, severe BAE, mild TR, PASP 37   Dependence on continuous supplemental oxygen    3L   Hematuria 01/10/2021   Hepatic fibrosis 02/29/2016   F3/4 on elastography.    Hepatitis C, chronic (HCC) 08/19/2015   Hepatitis C, chronic (HCC) 08/19/2015   Treated in 2017 with SVR   History of cardiac catheterization    a. LHC 6/17: LAD irregs, o/w no CAD   Hypertension    Hyposmia 12/22/2017   OSA  (obstructive sleep apnea) 11/21/2015   No Cpap   Pleural effusion on right 03/14/2018   Sleep apnea    Tobacco abuse     Assessment: Patient admitted s/p urethral stent placement. On Xarelto PTA for Afib, unknown last dose however hospitalized for ~ 24 hours. HgB stable and PLTs 172. Pharmacy consulted to dose heparin.   Note elevated INR of 2.1, expected from DOAC intake. No other reason for INR elevation.   Goal of Therapy:  Heparin level 0.3-0.7 units/ml Monitor platelets by anticoagulation protocol: Yes   Plan:  No bolus w recent procedure and DOAC intake.  Start heparin infusion at 1500 units/hr Check anti-Xa level in 6 hours and daily while on heparin, use aPTT for now with anticipated recent DOAC intake.  Continue to monitor H&H and platelets  Estill Batten, PharmD, BCCCP  10/02/2022,9:57 AM

## 2022-10-02 NOTE — Progress Notes (Signed)
Elink made aware of patient having pauses and in slow Afib.Will continue to follow patient's care plan.

## 2022-10-02 NOTE — TOC CM/SW Note (Addendum)
Transition of Care [facility]) - Inpatient Brief Assessment   Patient Details  Name: Frank Morales MRN: [id number] Date of Birth: 1960-04-27  Transition of Care [facility]) CM/SW Contact:    Tom-Johnson, Hershal Coria, RN Phone Number: 10/02/2022, 1:08 PM   Clinical Narrative:  Patient presented to the ED with SOB, Cough, Weakness and Lower Abdominal Pain. Found to have Sepsis 2/2 UTI/Pyelonephritis s/p Stenting with  placement of a 20 French Caude Foley Catheter. Urology following. Patient was intubated, extubated yesterday. Has Hx of COPD, on 3.L O2. Patient is on 2L O2 chronically at home.   Patient from home with cousin, Onalee Hua. States he has a daughter but not involved in each others lives. Currently on disability. Used a scooter to appointments but scooter needs repair at this time, will schedule with Medicaid transportation for upcoming appointments. Has a cane, shower seat at home. Home O2 from [employer].  PCP is Nooruddin, Jason Fila, MD with [facility] and uses [facility] on [address]   Home health recommended by PT, patient has no preference. CM called in referral to [facility] and Tresa Endo voiced acceptance, info on AVS.    Patient requests transportation at discharge.     CM continues to follow for needs as patient progresses with care towards discharge.          Transition of Care Asessment: Insurance and Status: Insurance coverage has been reviewed Patient has primary care physician: Yes Home environment has been reviewed: Yes Prior level of function:: Independent Prior/Current Home Services: No current home services Social Determinants of Health Reivew: SDOH reviewed needs interventions Readmission risk has been reviewed: Yes Transition of care needs: transition of care needs identified, TOC will continue to follow

## 2022-10-02 NOTE — Progress Notes (Signed)
Doing well post extubation, passed bedside swallow study. Starting regular diet. Watch in ICU overnight given baseline COPD and chronic respiratory failure.   Steffanie Dunn, DO 10/02/22 1:20 PM [facility]  For contact information, see Amion. If no response to pager, please call PCCM consult pager. After hours, 7PM- 7AM, please call Elink.

## 2022-10-02 NOTE — Progress Notes (Signed)
Pharmacy Antibiotic Note  Frank Morales is a 62 y.o. male admitted on 10/01/2022 with obstructing right UPJ stone and PNA.  Pharmacy has been consulted for vancomycin and piperacillin/tazobactam dosing. AKI improved overnight, AUC now subtherapeutic at 370.   Patient received vancomycin in ED.  7/16 Vancomycin 2000mg  x1  Scr used: 1.29 mg/dL Weight: 161 kg Vd coeff: 0.5 L/kg Est AUC: 494  Plan: Vancomycin 1000 Q12 hr, eAUC: 494  Piperacillin/tazobactam 3.375g Q8h EI Monitor cultures, clinical status, renal function, vancomycin level Narrow abx as able and f/u duration    Height: 5\' 11"  (180.3 cm) Weight: (!) 139 kg (306 lb 7 oz) IBW/kg (Calculated) : 75.3  Temp (24hrs), Avg:99.1 F (37.3 C), Min:97.5 F (36.4 C), Max:102.9 F (39.4 C)  Recent Labs  Lab 10/01/22 1034 10/01/22 1041 10/01/22 1234 10/02/22 0146  WBC  --  11.8*  --  10.6*  CREATININE  --  1.97*  --  1.29*  LATICACIDVEN 2.5*  --  1.0  --     Estimated Creatinine Clearance: 86.8 mL/min (A) (by C-G formula based on SCr of 1.29 mg/dL (H)).    Allergies  Allergen Reactions   Lactose Intolerance (Gi) Nausea And Vomiting    Antimicrobials this admission: Cefe 7/16 MTZ 7/16 Vanc 7/16 >>  Piptazo 7/16 >>   Dose adjustments this admission: N/a  Microbiology results: 7/16 BCx:  7/16 UCx:   7/16 MRSA PCR:  7/16 Resp cx:  7/16: body fl cx:   Thank you for allowing pharmacy to be a part of this patient's care.  Estill Batten, PharmD, BCCCP  Clinical Pharmacist  Please check AMION for all [facility] phone numbers After 10:00 PM, call Main Pharmacy [phone]

## 2022-10-02 NOTE — Progress Notes (Signed)
1 Day Post-Op Subjective: 7/17: Patient was alert and responding correctly while ventilated.  Spontaneous breathing trial this morning.  Objective: Vital signs in last 24 hours: Temp:  [97.5 F (36.4 C)-100.9 F (38.3 C)] 97.8 F (36.6 C) (07/17 1140) Pulse Rate:  [56-157] 149 (07/17 0800) Resp:  [16-26] 22 (07/17 0800) BP: (84-129)/(36-93) 96/68 (07/17 0800) SpO2:  [97 %-100 %] 98 % (07/17 1122) FiO2 (%):  [40 %-100 %] 40 % (07/17 1112) Weight:  [139 kg-142.9 kg] 139 kg (07/17 0301)  Intake/Output from previous day: 07/16 0701 - 07/17 0700 In: 3945.1 [I.V.:1785; NG/GT:60; IV Piggyback:2100.1] Out: 2025 [Urine:2025]  Intake/Output this shift: Total I/O In: 256.3 [I.V.:256.3] Out: -   Physical Exam:  General: Alert and oriented CV: No cyanosis Lungs: equal chest rise Abdomen: Soft, NTND, no rebound or guarding Gu: Foley in place draining clear yellow urine  Lab Results: Recent Labs    10/01/22 1041 10/01/22 1838 10/02/22 0146  HGB 10.3* 11.6* 10.2*  HCT 32.1* 34.0* 31.9*   BMET Recent Labs    10/01/22 1041 10/01/22 1838 10/02/22 0146  NA 132* 135 135  K 3.2* 2.8* 3.5  CL 91*  --  98  CO2 29  --  25  GLUCOSE 128*  --  192*  BUN 27*  --  27*  CREATININE 1.97*  --  1.29*  CALCIUM 8.4*  --  8.1*     Studies/Results: Portable Chest x-ray  Result Date: 10/01/2022 CLINICAL DATA:  Endotracheal tube placement EXAM: PORTABLE CHEST 1 VIEW COMPARISON:  10/01/2022 FINDINGS: Endotracheal tube has been placed with tip measuring 5.3 cm above the carina. Shallow inspiration. Cardiac enlargement. Mild perihilar infiltration. Small bilateral pleural effusions. No pneumothorax. IMPRESSION: Endotracheal tube tip placed with tip measuring 5.3 cm above the carina. Persistent cardiac enlargement, perihilar infiltration, and bilateral pleural effusions. Electronically Signed   By: Burman Nieves M.D.   On: 10/01/2022 22:14   DG C-Arm 1-60 Min  Result Date:  10/01/2022 CLINICAL DATA:  Cystoscopy with ureteroscopy and stent placement. EXAM: DG C-ARM 1-60 MIN FLUOROSCOPY: Fluoroscopy Time:  14 seconds Radiation Exposure Index (if provided by the fluoroscopic device): 74.54 mGy Number of Acquired Spot Images: 9 COMPARISON:  10/01/2022. FINDINGS: Severe hydronephrosis is present on the right with interval placement of right ureteral stent. Please see operative report for additional information. IMPRESSION: Intraoperative utilization of fluoroscopy. Electronically Signed   By: Thornell Sartorius M.D.   On: 10/01/2022 21:11   DG Abd Portable 1V  Result Date: 10/01/2022 CLINICAL DATA:  OG tube placement EXAM: PORTABLE ABDOMEN - 1 VIEW COMPARISON:  CT chest/abdomen/pelvis 1 day prior FINDINGS: The enteric catheter tip is in the stomach. There is a paucity of bowel gas in the imaged abdomen without evidence of mechanical obstruction. A right ureteral stent is in place. IMPRESSION: Enteric catheter tip in the stomach. Electronically Signed   By: Lesia Hausen M.D.   On: 10/01/2022 19:16   CT CHEST ABDOMEN PELVIS WO CONTRAST  Result Date: 10/01/2022 CLINICAL DATA:  Sepsis, shortness of breath EXAM: CT CHEST, ABDOMEN AND PELVIS WITHOUT CONTRAST TECHNIQUE: Multidetector CT imaging of the chest, abdomen and pelvis was performed following the standard protocol without IV contrast. RADIATION DOSE REDUCTION: This exam was performed according to the departmental dose-optimization program which includes automated exposure control, adjustment of the mA and/or kV according to patient size and/or use of iterative reconstruction technique. COMPARISON:  05/16/2021 FINDINGS: CT CHEST FINDINGS Cardiovascular: Heart is enlarged in size. There are scattered coronary  artery calcifications. There is no evidence of pericardial effusion. Mediastinum/Nodes: There are few slightly enlarged lymph nodes in mediastinum. Lungs/Pleura: There are patchy infiltrates in both lower lung fields, more so on the  right side suggesting atelectasis/pneumonia. There is interval worsening of this finding. There is 3.2 cm lobular density in the lateral aspect of right lower lung field. Small right pleural effusion is seen. Rest of the lung fields show no focal infiltrates. There is no pneumothorax.There are multiple blebs and bullae in the upper lung fields, more so on the right side. Musculoskeletal: No acute findings are seen. CT ABDOMEN PELVIS FINDINGS Hepatobiliary: No focal abnormalities are seen in liver. There is no dilation of bile ducts. Gallbladder is unremarkable. Pancreas: No focal abnormalities are seen. Spleen: Unremarkable. Adrenals/Urinary Tract: Adrenals are unremarkable. There is interval appearance of marked right hydronephrosis. There is 1.4 cm calculus at the right ureteropelvic junction. There are multiple other bilateral renal stones, more so in the right kidney. Largest other renal stones measures 3.1 cm in maximum diameter in the midportion of right kidney. There is significant right perinephric stranding. There are no loculated fluid collections. Urinary bladder is unremarkable. Stomach/Bowel: Stomach is unremarkable. Small bowel loops are not dilated. Appendix is difficult to visualize right perinephric stranding is extending to the right iliac fossa there is no significant wall thickening in colon. Vascular/Lymphatic: Arterial calcifications are seen in the aorta and its major branches. Reproductive: There are calcifications in prostate. Other: There is no ascites or pneumoperitoneum. Small umbilical hernia containing fat is seen. The small ventral hernia containing short segment of a small bowel loop superior to the umbilicus. Musculoskeletal: Degenerative changes are noted in lumbar spine. There is minimal anterolisthesis at the L4-L5 level. There is spinal stenosis at L4-L5 level. There is encroachment of neural foramina at multiple levels in lumbar spine. IMPRESSION: There is marked right  hydronephrosis caused by 1.4 cm calculus at the right ureteropelvic junction. Multiple bilateral renal stones. There is marked right perinephric stranding which may be due to high-grade obstruction at the right ureteropelvic junction or superimposed pyelonephritis. There is no loculated perinephric fluid collection. There are patchy infiltrates in both lower lung fields, more so on the right side suggesting atelectasis/pneumonia. Small right pleural effusion. COPD with multiple blebs and bullae in the apices, more so on the right side. There is no evidence of intestinal obstruction or pneumoperitoneum. Aortic arteriosclerosis. Lumbar spondylosis. There is small ventral hernia containing short segment of small bowel loop in upper abdomen slightly superior to the umbilicus without evidence of obstruction or incarceration. Imaging findings of high-grade obstruction at the right ureteropelvic junction and possible acute pyelonephritis in right kidney were relayed to patient's provider Dr. Durwin Nora by telephone call. Electronically Signed   By: Ernie Avena M.D.   On: 10/01/2022 15:22   DG Chest Portable 1 View  Result Date: 10/01/2022 CLINICAL DATA:  Difficulty breathing EXAM: PORTABLE CHEST 1 VIEW COMPARISON:  Previous studies including the examination done earlier today FINDINGS: Transverse diameter of heart is increased. Central pulmonary vessels are prominent. Small bilateral pleural effusions are seen, more so on the right side. No significant interval changes are noted. IMPRESSION: Cardiomegaly. Central pulmonary vessels are prominent suggesting CHF. Small bilateral pleural effusions. Electronically Signed   By: Ernie Avena M.D.   On: 10/01/2022 14:56   DG Chest Portable 1 View  Result Date: 10/01/2022 CLINICAL DATA:  Shortness of breath EXAM: PORTABLE CHEST 1 VIEW COMPARISON:  09/09/2021 FINDINGS: Transverse diameter of heart is increased.  Central pulmonary vessels are prominent. There are no  signs of alveolar pulmonary edema. There is no focal pulmonary consolidation. Small bilateral pleural effusions are seen. There is no pneumothorax. IMPRESSION: Cardiomegaly. Possibility of pericardial effusion is not excluded. Central pulmonary vessels are prominent suggesting mild CHF. There are no signs of alveolar pulmonary edema or focal pulmonary consolidation. Small bilateral pleural effusions. Electronically Signed   By: Ernie Avena M.D.   On: 10/01/2022 12:26    Assessment/Plan: # Obstructing right ureteral stone # Sepsis #AKI S/p right ureteral stent placement with Dr. Alvester Morin on 10/01/2022. Patient was weaned from vasopressors and will hopefully be extubated later today.  I explained the case and plan and he nodded appropriately.  Will need to schedule definitive stone management on an outpatient basis. Interval improvement in Scr.  UC pending.  Tailor ABX accordingly.   LOS: 1 day   Elmon Kirschner, NP [facility] Pager: [phone]  10/02/2022, 12:11 PM

## 2022-10-02 NOTE — Procedures (Signed)
Extubation Procedure Note  Patient Details:   Name: Frank Morales DOB: [birth year] MRN: [id number]   Airway Documentation:    Vent end date: 10/02/22 Vent end time: 1122   Evaluation  O2 sats: stable throughout Complications: No apparent complications Patient did tolerate procedure well. Bilateral Breath Sounds: Diminished, Rhonchi   Yes  Pt extubated to 4L [place] with RT X 2 and RN at bedside. Positive cuff leak noted and vitals stable. Pt is tolerating well. RT will monitor.   Karie Chimera 10/02/2022, 11:31 AM

## 2022-10-02 NOTE — Progress Notes (Signed)
ANTICOAGULATION CONSULT NOTE - Initial Consult  Pharmacy Consult for heparin  Indication: atrial fibrillation  Allergies  Allergen Reactions   Lactose Intolerance (Gi) Nausea And Vomiting    Patient Measurements: Height: 5\' 11"  (180.3 cm) Weight: (!) 139 kg (306 lb 7 oz) IBW/kg (Calculated) : 75.3 Heparin Dosing Weight: 108.8kg   Vital Signs: Temp: 97.7 F (36.5 C) (07/17 1500) Temp Source: Oral (07/17 1500) BP: 108/61 (07/17 1800) Pulse Rate: 170 (07/17 1800)  Labs: Recent Labs    10/01/22 1041 10/01/22 1044 10/01/22 1409 10/01/22 1811 10/01/22 1838 10/02/22 0146 10/02/22 1744  HGB 10.3*  --   --   --  11.6* 10.2*  --   HCT 32.1*  --   --   --  34.0* 31.9*  --   PLT 212  --   --   --   --  172  --   APTT  --   --   --   --   --   --  32  LABPROT  --  28.4*  --   --   --  24.1*  --   INR  --  2.6*  --   --   --  2.1*  --   HEPARINUNFRC  --   --   --   --   --   --  0.85*  CREATININE 1.97*  --   --   --   --  1.29*  --   TROPONINIHS  --   --  37* 24*  --   --   --     Estimated Creatinine Clearance: 86.8 mL/min (A) (by C-G formula based on SCr of 1.29 mg/dL (H)).   Medical History: Past Medical History:  Diagnosis Date   Acute exacerbation of CHF (congestive heart failure) (HCC) 12/15/2019   Acute heart failure (HCC) 12/16/2019   Angio-edema    Atrial fibrillation and flutter (HCC) 08/17/2015   A. S/p failed DCCV // b. Severe BAE on echo >> rate control strategy (has seen AF clinic) // c. Xarelto for anticoag (CHADS2-VASc=2 / CHF, HTN)   Chronic diastolic CHF (congestive heart failure) (HCC) 08/30/2015   A. Echo 6/17: Apical HK, moderate focal basal and mild concentric LVH, EF 50-55, diffuse HK, trivial MR, severe BAE, mild TR, PASP 37   Dependence on continuous supplemental oxygen    3L   Hematuria 01/10/2021   Hepatic fibrosis 02/29/2016   F3/4 on elastography.    Hepatitis C, chronic (HCC) 08/19/2015   Hepatitis C, chronic (HCC) 08/19/2015   Treated  in 2017 with SVR   History of cardiac catheterization    a. LHC 6/17: LAD irregs, o/w no CAD   Hypertension    Hyposmia 12/22/2017   OSA (obstructive sleep apnea) 11/21/2015   No Cpap   Pleural effusion on right 03/14/2018   Sleep apnea    Tobacco abuse     Assessment: Patient admitted s/p urethral stent placement. On Xarelto PTA for Afib, unknown last dose however hospitalized for ~ 24 hours. HgB stable and PLTs 172. Pharmacy consulted to dose heparin.   Note elevated INR of 2.1, expected from DOAC intake. No other reason for INR elevation.   PTT came back at 32 and HL at 0.85 this PM. Not correlating yet. No issue with drip per Rn. We will increase rate and check level in AM.   Goal of Therapy:  Heparin level 0.3-0.7 units/ml Monitor platelets by anticoagulation protocol: Yes   Plan:  Increase heparin  infusion to 1800 units/hr Check anti-Xa level in AM and daily while on heparin, use aPTT for now with anticipated recent DOAC intake.  Continue to monitor H&H and platelets  Ulyses Southward, PharmD, BCIDP, AAHIVP, CPP Infectious Disease Pharmacist 10/02/2022 7:30 PM

## 2022-10-02 NOTE — Progress Notes (Signed)
[facility] ADULT ICU REPLACEMENT PROTOCOL   The patient does apply for the [facility] Adult ICU Electrolyte Replacment Protocol based on the criteria listed below:   1.Exclusion criteria: TCTS, ECMO, Dialysis, and Myasthenia Gravis patients 2. Is GFR >/= 30 ml/min? Yes.    Patient's GFR today is >60 3. Is SCr </= 2? Yes.   Patient's SCr is 1.29 mg/dL 4. Did SCr increase >/= 0.5 in 24 hours? No. 5.Pt's weight >40kg  Yes.   6. Abnormal electrolyte(s): potassium 3.5  7. Electrolytes replaced per protocol 8.  Call MD STAT for K+ </= 2.5, Phos </= 1, or Mag </= 1 Physician:  protocol  Melvern Banker 10/02/2022 3:18 AM

## 2022-10-02 NOTE — Progress Notes (Signed)
 Pt placed on PS/CPAP 5/5 on 40% and is tolerating well. RT will monitor.

## 2022-10-02 NOTE — Progress Notes (Signed)
NAME:  Frank Morales, MRN:  [id number], DOB:  02/12/1961, LOS: 1 ADMISSION DATE:  10/01/2022, CONSULTATION DATE:  10/01/2022 REFERRING MD:  Durwin Nora, MD, CHIEF COMPLAINT:  AMS   History of Present Illness:  Frank Morales is a 62 year old male with PMH notable for CHF (EF 55-60%), Chronic Hep C, OSA, Tobacco abuse, HTN, Afib/Flutter (Xarelto). Pt presented to [facility] ED today 10/01/2022 with generalized weakness and SOB. Stated that his weakness has been ongoing for the past several days and he has recently become too weak to ambulate. Notably pt has history of COPD and CHD and wears 3L [place] at baseline. Pt endorsed dysuria and lower abdominal pain on arrival to ED. On interview and examination pt obtunded, awakening eyes only to firm sternal rub and noxious stimuli.  CT chest abdomen pelvis without contrast was performed in the emergency department which showed marked right hydronephrosis, multiple bilateral renal stones, and and right perinephric stranding which may be due to high-grade obstruction or superimposed pyelonephritis.  ED providers had reached out to urology service who plan to take patient emergently to the operating room for likely stent placement.  Patient is in septic shock secondary to above-PCCM was consulted for admission and patient will be admitted to ICU service further workup and evaluation.   Pertinent  Medical History  CHF (EF 55 to 60%), chronic hep C, OSA, tobacco abuse, hypertension, A-fib/flutter (Xarelto), COPD wears 3 L nasal cannula at baseline.   Significant Hospital Events: Including procedures, antibiotic start and stop dates in addition to other pertinent events   7/16-patient arrived to emergency department in septic shock with obstructive right kidney stone with hydronephrosis and right perinephric stranding-emergently will be taken to the OR  Interim History / Subjective:  Overnight no acute events. This morning he complains of ETT-related discomfort but  denies abdominal or back pain.   Objective   Blood pressure 96/68, pulse (!) 149, temperature 98.5 F (36.9 C), temperature source Oral, resp. rate (!) 22, height 5\' 11"  (1.803 m), weight (!) 139 kg, SpO2 100%.    Vent Mode: PRVC FiO2 (%):  [40 %-100 %] 40 % Set Rate:  [18 bmp-22 bmp] 22 bmp Vt Set:  [600 mL] 600 mL PEEP:  [5 cmH20] 5 cmH20 Plateau Pressure:  [17 cmH20] 17 cmH20   Intake/Output Summary (Last 24 hours) at 10/02/2022 0819 Last data filed at 10/02/2022 0600 Gross per 24 hour  Intake 3945.07 ml  Output 2025 ml  Net 1920.07 ml   Filed Weights   10/01/22 1030 10/01/22 1224 10/02/22 0301  Weight: (!) 145.2 kg (!) 142.9 kg (!) 139 kg    Examination: Physical Exam Criticaly ill appearing man lying in bed intubated, not sedated /AT, eyes anicteric, ETT in place Breathing comfortably on MV, mild expiratory abdominal accessory muscle use. Breathing mechanics improved on SBT. S1S2, bradycardic, irreg rhythm Abd obese, soft, NT Mild pedal edema, no leg edema. Skin warm, dry no diffuse rashes RASS -1, able to lift head off the bed and answer y/n questions.   BUN 27 Cr 1.29 WBC 10.6 H/H 10.2/31.9 Platelets 172 INR 2.1  Urine culture> pending Blood culture> pending CT personally reviewed> RLL 3.2cm opacity in RLL concerning for mass; RLL opacity in posterior RLL    Resolved Hospital Problem list    Lactic acidosis Hyponatremia  Assessment & Plan:  Septic shock secondary to Pyelonephritis. RLL infiltrates concerning for possible pneumonia, but clinically seems less likely. Right hydronephrosis with obstructing renal calculi 1.4cm  at right ureteropelvic junction ; s/p cystoscopy with ureteroscopy and stenting Acute kidney injury secondary to sepsis -Appreciate urology's management, will continue care with him as an outpatient - Continue broad-spectrum antibiotics.  Follow cultures and de-escalate as able - Norepinephrine as required to maintain MAP greater  than 65. -may need to consider stopping farxiga if he has had recurrent UTIs   Acute metabolic encephalopathy secondary to sepsis-improving -Delirium precautions - PAD protocol - Treat underlying sepsis   Acute on chronic respiratory failure with hypoxia RLL opacities COPD- with 2L HOT -Needs outpatient follow-up CT in 1 month to ensure that right lateral lower lobe opacity has resolved.  This is concerning for a malignancy if it is not resolving. -Continue antibiotics, which would cover pneumonia, although clinically this seems less likely - Bronchodilators-Yupelri and Brovana -hold additional IVF -LTVV - VAP prevention protocol - PAD protocol for sedation - Daily SAT and SBT.  Still failing due to bradypnea.  Waiting for him to wake up more before hopefully extubating later today.   Chronic HFpEF, EF 55-60%  Chronic atrial fibrillation and flutter on Xarelto prior to admission -Resume PTA Lasix, metolazone, Cardizem as able -restart home medications when appropriate- Cardizem, lasix, metolazone -Continue to hold Xarelto; start heparin infusion   Hypokalemia -replete and continue to monitor with AM labs   Hyperbilirubinemia, most likely due to sepsis -recheck tomorrow   Afib & Flutter, chronically anticoagulated on xarleto Coagulapathy  -Holding home Xarelto given OR today, restart when clinically appropriate  -Tele monitoring  RLL lung opacity-- 3.2 cm -needs follow up CT in 1 month- if persistent needs biopsy  IDA -resume PTA iron supplements if we can confirm this was a PTA med he was taking -serial CBCs  Pre-DM, hyperglycemia -hold PTA farxiga -SSI  -goal BG 140-180  Allergic rhinitis -can resume flonase and antihistamine is needed  GERD  -con't PTA PPI  Best Practice (right click and "Reselect all SmartList Selections" daily)   Diet/type: NPO DVT prophylaxis: systemic heparin GI prophylaxis: PPI Lines: N/A Foley:  Yes, and it is still needed Code  Status:  full code Last date of multidisciplinary goals of care discussion [10/02/22 ]  Labs   CBC: Recent Labs  Lab 10/01/22 1041 10/01/22 1838 10/02/22 0146  WBC 11.8*  --  10.6*  NEUTROABS 9.2*  --   --   HGB 10.3* 11.6* 10.2*  HCT 32.1* 34.0* 31.9*  MCV 82.3  --  81.4  PLT 212  --  172    Basic Metabolic Panel: Recent Labs  Lab 10/01/22 1041 10/01/22 1811 10/01/22 1838 10/02/22 0146  NA 132*  --  135 135  K 3.2*  --  2.8* 3.5  CL 91*  --   --  98  CO2 29  --   --  25  GLUCOSE 128*  --   --  192*  BUN 27*  --   --  27*  CREATININE 1.97*  --   --  1.29*  CALCIUM 8.4*  --   --  8.1*  MG  --  2.4  --  2.6*  PHOS  --  4.1  --  3.8   GFR: Estimated Creatinine Clearance: 86.8 mL/min (A) (by C-G formula based on SCr of 1.29 mg/dL (H)). Recent Labs  Lab 10/01/22 1034 10/01/22 1041 10/01/22 1234 10/01/22 1811 10/02/22 0146  PROCALCITON  --   --   --  3.90  --   WBC  --  11.8*  --   --  10.6*  LATICACIDVEN 2.5*  --  1.0  --   --     Critical care time:     This patient is critically ill with multiple organ system failure which requires frequent high complexity decision making, assessment, support, evaluation, and titration of therapies. This was completed through the application of advanced monitoring technologies and extensive interpretation of multiple databases. During this encounter critical care time was devoted to patient care services described in this note for 40 minutes.  Steffanie Dunn, DO 10/02/22 10:04 AM [facility]  For contact information, see Amion. If no response to pager, please call PCCM consult pager. After hours, 7PM- 7AM, please call Elink.

## 2022-10-03 DIAGNOSIS — R6521 Severe sepsis with septic shock: Secondary | ICD-10-CM | POA: Diagnosis not present

## 2022-10-03 DIAGNOSIS — A419 Sepsis, unspecified organism: Secondary | ICD-10-CM | POA: Diagnosis not present

## 2022-10-03 LAB — CBC
HCT: 33.7 % — ABNORMAL LOW (ref 39.0–52.0)
Hemoglobin: 10.6 g/dL — ABNORMAL LOW (ref 13.0–17.0)
MCH: 25.7 pg — ABNORMAL LOW (ref 26.0–34.0)
MCHC: 31.5 g/dL (ref 30.0–36.0)
MCV: 81.8 fL (ref 80.0–100.0)
Platelets: 175 10*3/uL (ref 150–400)
RBC: 4.12 MIL/uL — ABNORMAL LOW (ref 4.22–5.81)
RDW: 22.7 % — ABNORMAL HIGH (ref 11.5–15.5)
WBC: 8.8 10*3/uL (ref 4.0–10.5)
nRBC: 0 % (ref 0.0–0.2)

## 2022-10-03 LAB — BASIC METABOLIC PANEL
Anion gap: 10 (ref 5–15)
BUN: 28 mg/dL — ABNORMAL HIGH (ref 6–20)
CO2: 28 mmol/L (ref 22–32)
Calcium: 8.1 mg/dL — ABNORMAL LOW (ref 8.9–10.3)
Chloride: 99 mmol/L (ref 98–111)
Creatinine, Ser: 1.08 mg/dL (ref 0.61–1.24)
GFR, Estimated: >60 mL/min (ref >60)
Glucose, Bld: 107 mg/dL — ABNORMAL HIGH (ref 70–99)
Potassium: 3.5 mmol/L (ref 3.5–5.1)
Sodium: 137 mmol/L (ref 135–145)

## 2022-10-03 LAB — MRSA NEXT GEN BY PCR, NASAL: MRSA by PCR Next Gen: NOT DETECTED

## 2022-10-03 LAB — GLUCOSE, CAPILLARY
Glucose-Capillary: 94 mg/dL (ref 70–99)
Glucose-Capillary: 88 mg/dL (ref 70–99)

## 2022-10-03 LAB — HEPARIN LEVEL (UNFRACTIONATED): Heparin Unfractionated: 0.77 IU/mL — ABNORMAL HIGH (ref 0.30–0.70)

## 2022-10-03 LAB — APTT: aPTT: 35 seconds (ref 24–36)

## 2022-10-03 MED ORDER — DOCUSATE SODIUM 100 MG PO CAPS
100.0000 mg | ORAL_CAPSULE | Freq: Two times a day (BID) | ORAL | Status: DC
  Administered 2022-10-05 – 2022-10-06 (×3): 100 mg via ORAL
  Filled 2022-10-03 (×6): qty 1

## 2022-10-03 MED ORDER — DILTIAZEM HCL 30 MG PO TABS
240.0000 mg | ORAL_TABLET | Freq: Every day | ORAL | Status: DC
  Administered 2022-10-03: 240 mg via ORAL
  Filled 2022-10-03: qty 4
  Filled 2022-10-03: qty 8

## 2022-10-03 MED ORDER — PANTOPRAZOLE SODIUM 40 MG PO TBEC
40.0000 mg | DELAYED_RELEASE_TABLET | Freq: Every day | ORAL | Status: DC
  Administered 2022-10-03 – 2022-10-06 (×4): 40 mg via ORAL
  Filled 2022-10-03 (×4): qty 1

## 2022-10-03 MED ORDER — POTASSIUM CHLORIDE CRYS ER 20 MEQ PO TBCR
40.0000 meq | EXTENDED_RELEASE_TABLET | Freq: Once | ORAL | Status: AC
  Administered 2022-10-03: 40 meq via ORAL
  Filled 2022-10-03: qty 2

## 2022-10-03 MED ORDER — OXYCODONE HCL 5 MG PO TABS
5.0000 mg | ORAL_TABLET | Freq: Once | ORAL | Status: AC
  Administered 2022-10-03: 5 mg via ORAL
  Filled 2022-10-03: qty 1

## 2022-10-03 MED ORDER — POLYETHYLENE GLYCOL 3350 17 G PO PACK: 17.0000 g | PACK | Freq: Every day | ORAL | Status: DC

## 2022-10-03 MED ORDER — RIVAROXABAN 20 MG PO TABS
20.0000 mg | ORAL_TABLET | Freq: Every day | ORAL | Status: DC
  Administered 2022-10-03 – 2022-10-05 (×3): 20 mg via ORAL
  Filled 2022-10-03 (×3): qty 1

## 2022-10-03 MED ORDER — OXYCODONE HCL 5 MG PO TABS: 5.0000 mg | ORAL_TABLET | Freq: Four times a day (QID) | ORAL | Status: DC | PRN

## 2022-10-03 NOTE — Progress Notes (Signed)
NAME:  Frank Morales, MRN:  [id number], DOB:  10/03/1960, LOS: 2 ADMISSION DATE:  10/01/2022, CONSULTATION DATE:  10/01/2022 REFERRING MD:  Durwin Nora, MD, CHIEF COMPLAINT:  AMS   History of Present Illness:  Frank Morales is a 62 year old male with PMH notable for CHF (EF 55-60%), Chronic Hep C, OSA, Tobacco abuse, HTN, Afib/Flutter (Xarelto). Pt presented to [facility] ED today 10/01/2022 with generalized weakness and SOB. Stated that his weakness has been ongoing for the past several days and he has recently become too weak to ambulate. Notably pt has history of COPD and CHD and wears 3L [place] at baseline. Pt endorsed dysuria and lower abdominal pain on arrival to ED. On interview and examination pt obtunded, awakening eyes only to firm sternal rub and noxious stimuli.  CT chest abdomen pelvis without contrast was performed in the emergency department which showed marked right hydronephrosis, multiple bilateral renal stones, and and right perinephric stranding which may be due to high-grade obstruction or superimposed pyelonephritis.  ED providers had reached out to urology service who plan to take patient emergently to the operating room for likely stent placement.  Patient is in septic shock secondary to above-PCCM was consulted for admission and patient will be admitted to ICU service further workup and evaluation.   Pertinent  Medical History  CHF (EF 55 to 60%), chronic hep C, OSA, tobacco abuse, hypertension, A-fib/flutter (Xarelto), COPD wears 3 L nasal cannula at baseline.   Significant Hospital Events: Including procedures, antibiotic start and stop dates in addition to other pertinent events   7/16-patient arrived to emergency department in septic shock with obstructive right kidney stone with hydronephrosis and right perinephric stranding, intubated for OR, cystoscopy with right retrograde pyelogram and right ureteral stent placement, started on Vancomycin and Zosyn  7/17- Off of pressors  and extubated   Interim History / Subjective:  Overnight no acute events. This morning reports penile and right sided abdominal pain. Additionally reports mild dyspnea. Patient stable to transfer to the floor.   Objective   Blood pressure (!) 126/59, pulse (!) 109, temperature 98.8 F (37.1 C), temperature source Oral, resp. rate (!) 27, height 5\' 11"  (1.803 m), weight (!) 141.3 kg, SpO2 98%.      Intake/Output Summary (Last 24 hours) at 10/03/2022 1010 Last data filed at 10/03/2022 0600 Gross per 24 hour  Intake 1481.1 ml  Output 3025 ml  Net -1543.9 ml   Filed Weights   10/01/22 1224 10/02/22 0301 10/03/22 0500  Weight: (!) 142.9 kg (!) 139 kg (!) 141.3 kg    Examination: Physical Exam Ill appearing, sitting in bed  [place]/AT, eyes anicteric, nasal cannula in place  Breathing comfortably on 4L [place] , mild diffuse expiratory wheeze bilaterally  S1S2, regular rate, irreg rhythm Abd obese, soft, tender to palpation on right side, no guarding, + BS    Mild pedal edema, no leg edema. Skin warm, dry no diffuse rashes RASS 0   BUN 28 Cr 1.08 WBC 8.8 H/H 10.6/33.7 Platelets 175  Urine culture- < 10k bacteria  Blood culture- NG 24 hours  OR culture - pending  CT personally reviewed> RLL 3.2cm opacity in RLL concerning for mass; RLL opacity in posterior RLL    Resolved Hospital Problem list    Lactic acidosis Hyponatremia AKI  Assessment & Plan:  Septic shock secondary to Pyelonephritis. RLL infiltrates concerning for possible pneumonia, but clinically seems less likely. Right hydronephrosis with obstructing renal calculi 1.4cm at right ureteropelvic junction ;  s/p cystoscopy with ureteroscopy and stenting Cr (baseline ~ 0.8) has improved.  Appreciate urology's management, will continue care with him as an outpatient - Continue broad-spectrum antibiotics.  Follow cultures and de-escalate as able -may need to consider stopping farxiga if he has had recurrent UTIs - Oxycodone  5 mg q 8 hours prn for pain    Acute metabolic encephalopathy secondary to sepsis-improving - Delirium precautions - PAD protocol - Treat underlying sepsis   Acute on chronic respiratory failure with hypoxia RLL opacities COPD- with 2L HOT -Needs outpatient follow-up CT in 1 month to ensure that right lateral lower lobe opacity has resolved.  This is concerning for a malignancy if it is not resolving. -Continue antibiotics, which would cover pneumonia, although clinically this seems less likely - Bronchodilators-Yupelri and Brovana -hold additional IVF -LTVV - VAP prevention protocol - PAD protocol for sedation   Chronic HFpEF, EF 55-60%  Chronic atrial fibrillation and flutter on Xarelto prior to admission -Resume PTA Lasix, metolazone  as able -restart home medications when appropriate- lasix, metolazone - Stop heparin gtt and transition to home xarelto as patient transfers off the ICU    Hypokalemia -replete and continue to monitor with AM labs   Afib & Flutter, chronically anticoagulated on xarleto Coagulapathy  Tachycardia in 100-110  - Transitioning back to home xarelto  - Restarting home half dose of cardizem  -Tele monitoring  RLL lung opacity-- 3.2 cm -needs follow up CT in 1 month- if persistent needs biopsy  IDA - Continue PTA iron supplements if we can confirm this was a PTA med he was taking -serial CBCs  Pre-DM, hyperglycemia -hold PTA farxiga -SSI  -goal BG 140-180  Allergic rhinitis -can resume flonase and antihistamine is needed  GERD  -con't PTA PPI  Best Practice (right click and "Reselect all SmartList Selections" daily)   Diet/type: Regular consistency (see orders) DVT prophylaxis: systemic heparin GI prophylaxis: PPI Lines: N/A Foley:  Yes, and it is still needed per urology  Code Status:  full code Last date of multidisciplinary goals of care discussion [10/03/22 ]  Labs   CBC: Recent Labs  Lab 10/01/22 1041 10/01/22 1838  10/02/22 0146 10/02/22 2351  WBC 11.8*  --  10.6* 8.8  NEUTROABS 9.2*  --   --   --   HGB 10.3* 11.6* 10.2* 10.6*  HCT 32.1* 34.0* 31.9* 33.7*  MCV 82.3  --  81.4 81.8  PLT 212  --  172 175    Basic Metabolic Panel: Recent Labs  Lab 10/01/22 1041 10/01/22 1811 10/01/22 1838 10/02/22 0146 10/02/22 2351  NA 132*  --  135 135 137  K 3.2*  --  2.8* 3.5 3.5  CL 91*  --   --  98 99  CO2 29  --   --  25 28  GLUCOSE 128*  --   --  192* 107*  BUN 27*  --   --  27* 28*  CREATININE 1.97*  --   --  1.29* 1.08  CALCIUM 8.4*  --   --  8.1* 8.1*  MG  --  2.4  --  2.6*  --   PHOS  --  4.1  --  3.8  --    GFR: Estimated Creatinine Clearance: 104.6 mL/min (by C-G formula based on SCr of 1.08 mg/dL). Recent Labs  Lab 10/01/22 1034 10/01/22 1041 10/01/22 1234 10/01/22 1811 10/02/22 0146 10/02/22 2351  PROCALCITON  --   --   --  3.90  --   --  WBC  --  11.8*  --   --  10.6* 8.8  LATICACIDVEN 2.5*  --  1.0  --   --   --    Lockie Mola, MD  PGY-2 [facility]

## 2022-10-03 NOTE — Progress Notes (Signed)
Patient discussed with Internal Medicine team. IM team to take over patient care at 7 AM on 7/19.   Lockie Mola, MD  PGY-2 [facility]

## 2022-10-03 NOTE — Progress Notes (Signed)
[facility] ADULT ICU REPLACEMENT PROTOCOL   The patient does apply for the [facility] Adult ICU Electrolyte Replacment Protocol based on the criteria listed below:   1.Exclusion criteria: TCTS, ECMO, Dialysis, and Myasthenia Gravis patients 2. Is GFR >/= 30 ml/min? Yes.    Patient's GFR today is >60 3. Is SCr </= 2? Yes.   Patient's SCr is 1.08 mg/dL 4. Did SCr increase >/= 0.5 in 24 hours? No. 5.Pt's weight >40kg  Yes.   6. Abnormal electrolyte(s): potassium 3.5  7. Electrolytes replaced per protocol 8.  Call MD STAT for K+ </= 2.5, Phos </= 1, or Mag </= 1 Physician:  protocol  Melvern Banker 10/03/2022 4:29 AM

## 2022-10-03 NOTE — Progress Notes (Signed)
Received page from ICU - patient is ready to be transferred to the Internal Medicine Teaching Service. [facility] resident will see on 10/04/2022 and assume care.  Morene Crocker, MD [facility] Internal Medicine Program - PGY-2 10/03/2022, 2:09 PM Pager# [phone]   Please contact the on call pager after 5 pm and on weekends at [phone].

## 2022-10-03 NOTE — Progress Notes (Incomplete)
Attending note: I have seen and examined the patient. History, labs and imaging reviewed.  Admit for septic shock secondary to pyelonephritis s/p stenting. Off pressors, extubated yesterday.   Blood pressure (!) 126/59, pulse (!) 109, temperature 98.8 F (37.1 C), temperature source Oral, resp. rate (!) 27, height 5\' 11"  (1.803 m), weight (!) 141.3 kg, SpO2 98%. Gen:      No acute distress HEENT:  EOMI, sclera anicteric Neck:     No masses; no thyromegaly Lungs:    Clear to auscultation bilaterally; normal respiratory effort*** CV:         Regular rate and rhythm; no murmurs Abd:      + bowel sounds; soft, non-tender; no palpable masses, no distension Ext:    No edema; adequate peripheral perfusion Skin:      Warm and dry; no rash Neuro: alert and oriented x 3 Psych: normal mood and affect   Labs/Imaging personally reviewed, significant for   Assessment/plan: Sepsis Check MRSA swab and dc Vanco if negative, continue zosyn Follow final cultures Continue oxy for pain.   Afib On heparin drip. Switch back to xarelto. Start home cardizem.    The patient is critically ill with multiple organ systems failure and requires high complexity decision making for assessment and support, frequent evaluation and titration of therapies, application of advanced monitoring technologies and extensive interpretation of multiple databases.  Critical care time - 35 mins. This represents my time independent of the NPs time taking care of the pt.  Chilton Greathouse MD [facility] 10/03/2022, 9:51 AM

## 2022-10-03 NOTE — Evaluation (Signed)
Occupational Therapy Evaluation Patient Details Name: Frank Morales MRN: [id number] DOB: [birth year] Today's Date: 10/03/2022   History of Present Illness 62 y.o. male presents to [facility] hospital on 10/01/2022 with weakness, SOB, and dysuria. CT demonstrates R hydronephrosis, multiple bilateral ureteral stones, pyelonephritis. Pt underwent R ureteral stent placement on 7/16 remaining intubated post-procedure. Pt extubated 7/17.Marland Kitchen PMH includes CHF, hep C, OSA, HTN, afib, COPD.   Clinical Impression   PTA, pt lived with his cousins and was mod I for ADL and some light IADL/home making. Upon eval, pt with dizziness and has been in chair most of day; offered to get in bed and pt refused. Pt with stable vitals throughout session and RN ensuring pt is being closely monitored. Pt performing UB Adl with set-up and LB Adl with min giard-min A. Pt able to transfer to [facility] and perform toileting despite dizziness. Presenting with dizziness, generalized weakness, and decreased balance. Will continue to follow; recommending HHOT based on eval findings; may not need follow up pending progress.      Recommendations for follow up therapy are one component of a multi-disciplinary discharge planning process, led by the attending physician.  Recommendations may be updated based on patient status, additional functional criteria and insurance authorization.   Assistance Recommended at Discharge Intermittent Supervision/Assistance  Patient can return home with the following A little help with walking and/or transfers;A little help with bathing/dressing/bathroom;Assistance with cooking/housework;Assist for transportation;Help with stairs or ramp for entrance    Functional Status Assessment  Patient has had a recent decline in their functional status and demonstrates the ability to make significant improvements in function in a reasonable and predictable amount of time.  Equipment Recommendations  Tub/shower seat     Recommendations for Other Services       Precautions / Restrictions Precautions Precautions: Fall Precaution Comments: monitor sats Restrictions Weight Bearing Restrictions: No      Mobility Bed Mobility               General bed mobility comments: In chair on arrival and departure    Transfers Overall transfer level: Needs assistance Equipment used: Rolling walker (2 wheels) Transfers: Sit to/from Stand Sit to Stand: Min guard           General transfer comment: steady rise to standing with RW support, cues for hand placement      Balance Overall balance assessment: Needs assistance Sitting-balance support: No upper extremity supported, Feet supported Sitting balance-Leahy Scale: Good     Standing balance support: Single extremity supported, Bilateral upper extremity supported, Reliant on assistive device for balance Standing balance-Leahy Scale: Poor                             ADL either performed or assessed with clinical judgement   ADL Overall ADL's : Needs assistance/impaired Eating/Feeding: Modified independent;Sitting   Grooming: Set up;Sitting   Upper Body Bathing: Set up;Sitting   Lower Body Bathing: Minimal assistance;Sit to/from stand   Upper Body Dressing : Set up;Sitting   Lower Body Dressing: Minimal assistance;Sit to/from stand   Toilet Transfer: Min Acupuncturist Details (indicate cue type and reason): for safety Toileting- Clothing Manipulation and Hygiene: Supervision/safety;Sitting/lateral lean       Functional mobility during ADLs: Min guard;Rolling walker (2 wheels) General ADL Comments: SPT only due to pt with dizziness. Likely medication related. No significant change in BP or symptoms when transitioned from sitting to standing  Vision Baseline Vision/History: 0 No visual deficits Ability to See in Adequate Light: 0 Adequate Patient Visual Report: No change from  baseline Vision Assessment?: No apparent visual deficits     Perception Perception Perception Tested?: No   Praxis Praxis Praxis tested?: Not tested    Pertinent Vitals/Pain Pain Assessment Pain Assessment: No/denies pain     Hand Dominance Right   Extremity/Trunk Assessment Upper Extremity Assessment Upper Extremity Assessment: Generalized weakness   Lower Extremity Assessment Lower Extremity Assessment: Generalized weakness   Cervical / Trunk Assessment Cervical / Trunk Assessment: Normal   Communication Communication Communication: No difficulties   Cognition Arousal/Alertness: Awake/alert Behavior During Therapy: WFL for tasks assessed/performed Overall Cognitive Status: Within Functional Limits for tasks assessed                                 General Comments: Pt able to perform simple money management, delayed memory recall, following all commands, and awareness that it is not safe to mobilize independently when dizzy     General Comments  VSS on 3L [place]. BP 130s/80s on all vital checks with positional changes    Exercises     Shoulder Instructions      Home Living Family/patient expects to be discharged to:: Private residence Living Arrangements: Other relatives (2 cousins) Available Help at Discharge: Family;Available 24 hours/day Type of Home: House Home Access: Level entry     Home Layout: One level     Bathroom Shower/Tub: Chief Strategy Officer: Standard     Home Equipment: Cane - single point          Prior Functioning/Environment Prior Level of Function : Independent/Modified Independent             Mobility Comments: pt reports PRN use of SPC when feeling more tired or weak, otherwise ambulates without DME ADLs Comments: independent with ADLs; performs light cleaning within the home        OT Problem List: Decreased strength;Decreased activity tolerance;Impaired balance (sitting and/or standing)       OT Treatment/Interventions: Self-care/ADL training;Therapeutic exercise;DME and/or AE instruction;Patient/family education;Balance training;Therapeutic activities    OT Goals(Current goals can be found in the care plan section) Acute Rehab OT Goals Patient Stated Goal: get better OT Goal Formulation: With patient Time For Goal Achievement: 10/17/22 Potential to Achieve Goals: Good  OT Frequency: Min 2X/week    Co-evaluation              AM-PAC OT "6 Clicks" Daily Activity     Outcome Measure Help from another person eating meals?: None Help from another person taking care of personal grooming?: A Little Help from another person toileting, which includes using toliet, bedpan, or urinal?: A Little Help from another person bathing (including washing, rinsing, drying)?: A Little Help from another person to put on and taking off regular upper body clothing?: A Little Help from another person to put on and taking off regular lower body clothing?: A Little 6 Click Score: 19   End of Session Equipment Utilized During Treatment: Gait belt;Rolling walker (2 wheels) Nurse Communication: Mobility status;Other (comment) (RN in and out of room throughout session as pt with dizziness)  Activity Tolerance: Patient tolerated treatment well Patient left: in chair;with call bell/phone within reach  OT Visit Diagnosis: Unsteadiness on feet (R26.81);Muscle weakness (generalized) (M62.81)                Time: 9563-8756  OT Time Calculation (min): 37 min Charges:  OT General Charges $OT Visit: 1 Visit OT Evaluation $OT Eval Moderate Complexity: 1 Mod OT Treatments $Self Care/Home Management : 8-22 mins  Tyler Deis, OTR/L [facility] Acute Rehabilitation Office: [phone]   Myrla Halsted 10/03/2022, 5:12 PM

## 2022-10-03 NOTE — Progress Notes (Signed)
ANTICOAGULATION CONSULT NOTE - Follow Up  Pharmacy Consult for heparin  Indication: atrial fibrillation  Allergies  Allergen Reactions   Lactose Intolerance (Gi) Nausea And Vomiting    Patient Measurements: Height: 5\' 11"  (180.3 cm) Weight: (!) 139 kg (306 lb 7 oz) IBW/kg (Calculated) : 75.3 Heparin Dosing Weight: 108.8kg   Vital Signs: Temp: 99.3 F (37.4 C) (07/17 2323) Temp Source: Oral (07/17 2323) BP: 101/58 (07/18 0000) Pulse Rate: 100 (07/18 0000)  Labs: Recent Labs    10/01/22 1041 10/01/22 1044 10/01/22 1409 10/01/22 1811 10/01/22 1838 10/02/22 0146 10/02/22 1744 10/02/22 2351  HGB 10.3*  --   --   --  11.6* 10.2*  --  10.6*  HCT 32.1*  --   --   --  34.0* 31.9*  --  33.7*  PLT 212  --   --   --   --  172  --  175  APTT  --   --   --   --   --   --  32 35  LABPROT  --  28.4*  --   --   --  24.1*  --   --   INR  --  2.6*  --   --   --  2.1*  --   --   HEPARINUNFRC  --   --   --   --   --   --  0.85* 0.77*  CREATININE 1.97*  --   --   --   --  1.29*  --  1.08  TROPONINIHS  --   --  37* 24*  --   --   --   --     Estimated Creatinine Clearance: 103.7 mL/min (by C-G formula based on SCr of 1.08 mg/dL).   Medical History: Past Medical History:  Diagnosis Date   Acute exacerbation of CHF (congestive heart failure) (HCC) 12/15/2019   Acute heart failure (HCC) 12/16/2019   Angio-edema    Atrial fibrillation and flutter (HCC) 08/17/2015   A. S/p failed DCCV // b. Severe BAE on echo >> rate control strategy (has seen AF clinic) // c. Xarelto for anticoag (CHADS2-VASc=2 / CHF, HTN)   Chronic diastolic CHF (congestive heart failure) (HCC) 08/30/2015   A. Echo 6/17: Apical HK, moderate focal basal and mild concentric LVH, EF 50-55, diffuse HK, trivial MR, severe BAE, mild TR, PASP 37   Dependence on continuous supplemental oxygen    3L   Hematuria 01/10/2021   Hepatic fibrosis 02/29/2016   F3/4 on elastography.    Hepatitis C, chronic (HCC) 08/19/2015    Hepatitis C, chronic (HCC) 08/19/2015   Treated in 2017 with SVR   History of cardiac catheterization    a. LHC 6/17: LAD irregs, o/w no CAD   Hypertension    Hyposmia 12/22/2017   OSA (obstructive sleep apnea) 11/21/2015   No Cpap   Pleural effusion on right 03/14/2018   Sleep apnea    Tobacco abuse     Assessment: Patient admitted s/p urethral stent placement. On Xarelto PTA for Afib, unknown last dose however hospitalized for ~ 24 hours. HgB stable and PLTs 172. Pharmacy consulted to dose heparin.   Note elevated INR of 2.1, expected from DOAC intake. No other reason for INR elevation.   aPTT came back at 35 and HL at 0.77 this AM. Not correlating yet. No issue with drip documented. We will increase rate and check level in 8 hours. CBC stable  Goal of Therapy:  Heparin level 0.3-0.7 units/ml Monitor platelets by anticoagulation protocol: Yes   Plan:  Increase heparin infusion to 2100 units/hr Check anti-Xa level in AM and daily while on heparin, use aPTT for now with anticipated recent DOAC intake.  Continue to monitor H&H and platelets  Arabella Merles, PharmD. Clinical Pharmacist 10/03/2022 12:45 AM

## 2022-10-03 NOTE — Progress Notes (Signed)
Physical Therapy Treatment Patient Details Name: Frank Morales MRN: [id number] DOB: [birth year] Today's Date: 10/03/2022   History of Present Illness 62 y.o. male presents to [facility] hospital on 10/01/2022 with weakness, SOB, and dysuria. CT demonstrates R hydronephrosis, multiple bilateral ureteral stones, pyelonephritis. Pt underwent R ureteral stent placement on 7/16 remaining intubated post-procedure. Pt extubated 7/17.Marland Kitchen PMH includes CHF, hep C, OSA, HTN, afib, COPD.    PT Comments  Pt greeted up in chair on arrival and agreeable to session with encouragement. Pt with increasing c/o dizziness with increased mobility with BP dropping (see general comments, RN aware) and pt unable to maintain standing due to symptoms. BP impoving with seated exercise however further gait deferred due to continued c/o dizziness. Pt with steady power up to stand needing min guard for safety and cues for optimal hand placement. Current plan remains appropriate to address deficits and maximize functional independence and decrease caregiver burden. Pt continues to benefit from skilled PT services to progress toward functional mobility goals.      Assistance Recommended at Discharge PRN  If plan is discharge home, recommend the following:  Can travel by private vehicle    A little help with bathing/dressing/bathroom;Assistance with cooking/housework;Assist for transportation;Help with stairs or ramp for entrance      Equipment Recommendations  Rolling walker (2 wheels)    Recommendations for Other Services       Precautions / Restrictions Precautions Precautions: Fall Precaution Comments: monitor sats Restrictions Weight Bearing Restrictions: No     Mobility  Bed Mobility Overal bed mobility: Needs Assistance             General bed mobility comments: pt up in chair on arrival    Transfers Overall transfer level: Needs assistance Equipment used: Rolling walker (2 wheels) Transfers: Sit  to/from Stand Sit to Stand: Min guard           General transfer comment: steady rise to standing with RW support, cues for hand placement    Ambulation/Gait               General Gait Details: pt unable to progress this session due to dizziness and BP drop   Stairs             Wheelchair Mobility     Tilt Bed    Modified Rankin (Stroke Patients Only)       Balance Overall balance assessment: Needs assistance Sitting-balance support: No upper extremity supported, Feet supported Sitting balance-Leahy Scale: Good     Standing balance support: Single extremity supported, Bilateral upper extremity supported, Reliant on assistive device for balance Standing balance-Leahy Scale: Poor                              Cognition Arousal/Alertness: Awake/alert Behavior During Therapy: WFL for tasks assessed/performed Overall Cognitive Status: Within Functional Limits for tasks assessed                                          Exercises      General Comments General comments (skin integrity, edema, etc.): VSS on 3L [place], pt with c/o dizziness throughout session, BP pre session 109/57, sitting after brief stand as pt unable to maintain standing for standing BP due to symptoms 88/58, post seated marching/arm pumps 126/75      Pertinent Vitals/Pain Pain Assessment  Pain Assessment: No/denies pain    Home Living                          Prior Function            PT Goals (current goals can now be found in the care plan section) Acute Rehab PT Goals Patient Stated Goal: to return to baseline PT Goal Formulation: With patient Time For Goal Achievement: 10/16/22 Progress towards PT goals: Not progressing toward goals - comment (dizziness)    Frequency    Min 3X/week      PT Plan Current plan remains appropriate    Co-evaluation              AM-PAC PT "6 Clicks" Mobility   Outcome Measure  Help needed  turning from your back to your side while in a flat bed without using bedrails?: A Little Help needed moving from lying on your back to sitting on the side of a flat bed without using bedrails?: A Little Help needed moving to and from a bed to a chair (including a wheelchair)?: A Little Help needed standing up from a chair using your arms (e.g., wheelchair or bedside chair)?: A Little Help needed to walk in hospital room?: A Little Help needed climbing 3-5 steps with a railing? : Total 6 Click Score: 16    End of Session Equipment Utilized During Treatment: Oxygen Activity Tolerance: Other (comment) (limited by dizziness) Patient left: with call bell/phone within reach;in chair Nurse Communication: Mobility status;Other (comment) (BP) PT Visit Diagnosis: Other abnormalities of gait and mobility (R26.89);Muscle weakness (generalized) (M62.81)     Time: 1449-1510 PT Time Calculation (min) (ACUTE ONLY): 21 min  Charges:    $Therapeutic Activity: 8-22 mins PT General Charges $$ ACUTE PT VISIT: 1 Visit                     Valinda Fedie R. PTA Acute Rehabilitation Services Office: [phone]   Catalina Antigua 10/03/2022, 4:20 PM

## 2022-10-04 DIAGNOSIS — J449 Chronic obstructive pulmonary disease, unspecified: Secondary | ICD-10-CM

## 2022-10-04 DIAGNOSIS — L304 Erythema intertrigo: Secondary | ICD-10-CM | POA: Diagnosis not present

## 2022-10-04 DIAGNOSIS — R6521 Severe sepsis with septic shock: Secondary | ICD-10-CM | POA: Diagnosis not present

## 2022-10-04 DIAGNOSIS — A419 Sepsis, unspecified organism: Secondary | ICD-10-CM | POA: Diagnosis not present

## 2022-10-04 DIAGNOSIS — J9621 Acute and chronic respiratory failure with hypoxia: Secondary | ICD-10-CM | POA: Diagnosis not present

## 2022-10-04 DIAGNOSIS — N12 Tubulo-interstitial nephritis, not specified as acute or chronic: Secondary | ICD-10-CM | POA: Diagnosis not present

## 2022-10-04 LAB — DIFFERENTIAL
Abs Immature Granulocytes: 0.11 10*3/uL — ABNORMAL HIGH (ref 0.00–0.07)
Basophils Absolute: 0 10*3/uL (ref 0.0–0.1)
Basophils Relative: 1 %
Eosinophils Absolute: 0.1 10*3/uL (ref 0.0–0.5)
Eosinophils Relative: 1 %
Immature Granulocytes: 2 %
Lymphocytes Relative: 16 %
Lymphs Abs: 1 10*3/uL (ref 0.7–4.0)
Monocytes Absolute: 1.3 10*3/uL — ABNORMAL HIGH (ref 0.1–1.0)
Monocytes Relative: 19 %
Neutro Abs: 4 10*3/uL (ref 1.7–7.7)
Neutrophils Relative %: 61 %

## 2022-10-04 LAB — CBC
HCT: 30.5 % — ABNORMAL LOW (ref 39.0–52.0)
HCT: 32.1 % — ABNORMAL LOW (ref 39.0–52.0)
Hemoglobin: 9.3 g/dL — ABNORMAL LOW (ref 13.0–17.0)
Hemoglobin: 9.9 g/dL — ABNORMAL LOW (ref 13.0–17.0)
MCH: 24.6 pg — ABNORMAL LOW (ref 26.0–34.0)
MCH: 25.3 pg — ABNORMAL LOW (ref 26.0–34.0)
MCHC: 30.5 g/dL (ref 30.0–36.0)
MCHC: 30.8 g/dL (ref 30.0–36.0)
MCV: 80.7 fL (ref 80.0–100.0)
MCV: 82.1 fL (ref 80.0–100.0)
Platelets: 180 10*3/uL (ref 150–400)
Platelets: 187 10*3/uL (ref 150–400)
RBC: 3.78 MIL/uL — ABNORMAL LOW (ref 4.22–5.81)
RBC: 3.91 MIL/uL — ABNORMAL LOW (ref 4.22–5.81)
RDW: 22.4 % — ABNORMAL HIGH (ref 11.5–15.5)
RDW: 22.6 % — ABNORMAL HIGH (ref 11.5–15.5)
WBC: 5.6 10*3/uL (ref 4.0–10.5)
WBC: 6.5 10*3/uL (ref 4.0–10.5)
nRBC: 0 % (ref 0.0–0.2)
nRBC: 0 % (ref 0.0–0.2)

## 2022-10-04 LAB — BASIC METABOLIC PANEL
Anion gap: 8 (ref 5–15)
BUN: 13 mg/dL (ref 6–20)
CO2: 30 mmol/L (ref 22–32)
Calcium: 8.2 mg/dL — ABNORMAL LOW (ref 8.9–10.3)
Chloride: 98 mmol/L (ref 98–111)
Creatinine, Ser: 0.86 mg/dL (ref 0.61–1.24)
GFR, Estimated: >60 mL/min (ref >60)
Glucose, Bld: 93 mg/dL (ref 70–99)
Potassium: 3.2 mmol/L — ABNORMAL LOW (ref 3.5–5.1)
Sodium: 136 mmol/L (ref 135–145)

## 2022-10-04 LAB — HEMOGLOBIN A1C
Hgb A1c MFr Bld: 6.3 % — ABNORMAL HIGH (ref 4.8–5.6)
Mean Plasma Glucose: 134 mg/dL

## 2022-10-04 LAB — CULTURE, BLOOD (ROUTINE X 2): Culture: NO GROWTH

## 2022-10-04 LAB — AEROBIC/ANAEROBIC CULTURE W GRAM STAIN (SURGICAL/DEEP WOUND)

## 2022-10-04 MED ORDER — NYSTATIN 100000 UNIT/GM EX POWD
Freq: Three times a day (TID) | CUTANEOUS | Status: DC
  Filled 2022-10-04: qty 15

## 2022-10-04 MED ORDER — SODIUM CHLORIDE 0.9 % IV SOLN
3.0000 g | Freq: Four times a day (QID) | INTRAVENOUS | Status: DC
  Administered 2022-10-04 – 2022-10-05 (×2): 3 g via INTRAVENOUS
  Filled 2022-10-04 (×2): qty 8

## 2022-10-04 NOTE — Plan of Care (Signed)

## 2022-10-04 NOTE — Progress Notes (Signed)
Internal Medicine Teaching Service Attending Note Date: 10/04/2022  Patient name: Frank Morales  Medical record number: [id number]  Date of birth: April 28, 1960   I have seen and evaluated Frank Morales and discussed their care with the Residency Team.  Discussed with Dr Welton Flakes.  62 yo M with hx of afib, COPD (on home O2 3.5L), previously treated Hep C (2006) (fibrosis score 3), adm to ICU on 7-16 after found to have an obstructing stone and pyelo/sepsis. He underwent cystoscopy and R ureteral stent on 7-16.  His UCx grew- P mirabilis (pan-sens). BCx are ngtd. He has been treated with zosyn.   Today he feels well, he denies issues with passing his bowels or urine. He has his baseline level of abd distension. He also has his baseline cough, denies SOB. He has walked with OT  Physical Exam: Blood pressure 93/66, pulse 84, temperature 98.1 F (36.7 C), temperature source Oral, resp. rate 20, height 5\' 11"  (1.803 m), weight (!) 139.4 kg, SpO2 99%. Throat: normal findings: oropharynx pink & moist without lesions or evidence of thrush Resp: clear to auscultation bilaterally Cardio: regular rate and rhythm GI: normal findings: bowel sounds normal and soft, non-tender Extremities: edema non-pitting.   Lab results: Results for orders placed or performed during the hospital encounter of 10/01/22 (from the past 24 hour(s))  CBC     Status: Abnormal   Collection Time: 10/04/22 12:28 AM  Result Value Ref Range   WBC 5.6 4.0 - 10.5 K/uL   RBC 3.78 (L) 4.22 - 5.81 MIL/uL   Hemoglobin 9.3 (L) 13.0 - 17.0 g/dL   HCT 78.2 (L) 95.6 - 21.3 %   MCV 80.7 80.0 - 100.0 fL   MCH 24.6 (L) 26.0 - 34.0 pg   MCHC 30.5 30.0 - 36.0 g/dL   RDW 08.6 (H) 57.8 - 46.9 %   Platelets 187 150 - 400 K/uL   nRBC 0.0 0.0 - 0.2 %    Imaging results:  No results found.  Assessment and Plan: I agree with the formulated Assessment and Plan with the following changes:  COPD Urosepsis, Pyelo Afib  Will continue his  anbx at least 7 days Would change his anbx to unsayn His COPD appears to be back to baseline He needs a repeat liver u/s due to his previous fibrosis score.    **Disclaimer: This note may have been dictated with voice recognition software. Similar sounding words can inadvertently be transcribed and this note may contain transcription errors which may not have been corrected upon publication of note.**

## 2022-10-04 NOTE — Progress Notes (Signed)
Pharmacy Antibiotic Note  Frank Morales is a 62 y.o. male admitted on 10/01/2022 with sepsis/pyelo. S/p R ureteral stent 7/16. Urine cx with Proteus mirabilis (pan sensitive). Narrowing antibiotics to Unasyn for at least 7 days. Today is Day #4 of total antibiotics.  Plan: Unasyn 3gm IV q6h Will f/u length of therapy, renal function, and pt's clinical condition   Height: 5\' 11"  (180.3 cm) Weight: (!) 139.4 kg (307 lb 5.1 oz) IBW/kg (Calculated) : 75.3  Temp (24hrs), Avg:99.1 F (37.3 C), Min:98.1 F (36.7 C), Max:99.7 F (37.6 C)  Recent Labs  Lab 10/01/22 1034 10/01/22 1041 10/01/22 1234 10/02/22 0146 10/02/22 2351 10/04/22 0028  WBC  --  11.8*  --  10.6* 8.8 5.6  CREATININE  --  1.97*  --  1.29* 1.08  --   LATICACIDVEN 2.5*  --  1.0  --   --   --     Estimated Creatinine Clearance: 103.8 mL/min (by C-G formula based on SCr of 1.08 mg/dL).    Allergies  Allergen Reactions   Lactose Intolerance (Gi) Nausea And Vomiting    Antimicrobials this admission: Cefe 7/16 MTZ 7/16 Vanc 7/16 >> 7/18 Piptazo 7/16 >> 7/19 Unasyn 7/19 >>  Microbiology results: 7/16 BCx: ngtd 7/16 UCx:  proteus mirabilis (pan sens) 7/16 MRSA PCR: negative  Thank you for allowing pharmacy to be a part of this patient's care.  Christoper Fabian, PharmD, BCPS Please see amion for complete clinical pharmacist phone list 10/04/2022 4:51 PM  Please check AMION for all [facility] Pharmacy phone numbers After 10:00 PM, call Main Pharmacy [phone]

## 2022-10-04 NOTE — Progress Notes (Signed)
   3 Days Post-Op Subjective: 7/19: Patient ambulating in room. Pulled out hand IV. Helped him with bleeding and reviewed his case. NAEON.   Objective: Vital signs in last 24 hours: Temp:  [98.1 F (36.7 C)-99.7 F (37.6 C)] 98.1 F (36.7 C) (07/19 0800) Pulse Rate:  [52-103] 84 (07/19 1023) Resp:  [15-26] 20 (07/19 1023) BP: (88-151)/(50-84) 93/66 (07/19 1023) SpO2:  [95 %-100 %] 100 % (07/19 0814) Weight:  [139.4 kg] 139.4 kg (07/19 0500)  Intake/Output from previous day: 07/18 0701 - 07/19 0700 In: 566.6 [I.V.:105; IV Piggyback:221.6] Out: 2450 [Urine:2450]  Intake/Output this shift: Total I/O In: 328.3 [P.O.:240; IV Piggyback:88.3] Out: 560 [Urine:560]  Physical Exam:  General: Alert and oriented CV: No cyanosis Lungs: equal chest rise Abdomen: Soft, NTND, no rebound or guarding Gu: Foley in place draining clear yellow urine  Lab Results: Recent Labs    10/02/22 0146 10/02/22 2351 10/04/22 0028  HGB 10.2* 10.6* 9.3*  HCT 31.9* 33.7* 30.5*   BMET Recent Labs    10/02/22 0146 10/02/22 2351  NA 135 137  K 3.5 3.5  CL 98 99  CO2 25 28  GLUCOSE 192* 107*  BUN 27* 28*  CREATININE 1.29* 1.08  CALCIUM 8.1* 8.1*     Studies/Results: No results found.  Assessment/Plan: # Obstructing right ureteral stone # Sepsis #AKI S/p right ureteral stent placement with Dr. Alvester Morin on 10/01/2022. Extubated and hopefully leaving ICU. Voiding without issue. Clear yellow urine Scr normalized 7/17 Few colonies of Protues intra-op UC. Doesn't appear to be a significant colonization. Tailor ABX accordingly. Definitive stone mgmt on an outpt basis OK to discharge at the discretion of primary team. Urology will sign off at this time.    LOS: 3 days   Elmon Kirschner, NP [facility] Pager: [phone]  10/04/2022, 11:35 AM

## 2022-10-04 NOTE — Progress Notes (Addendum)
Interval Hx: Pt is a 62 year old man with past medical hx of COPD on 3 L [place], HTN, HLD, HFpEF, Afib/flutter on AC, Hep C, OSA,  and Tobacco use disorder who presented to ED on 07/16 with acute worsening dyspnea and weakness. He also had compliant of dysuria. Imaging showed pyelonephritis. Pt was admitted to the ICU given his lactic acid was elevated and he was hypotensive. CXR showed pulmonary consolidation and empiric PNA treatment was started. In the ICU pt required vent support and pressor support. Pt was taken to OR by urology for right ureteral stent placement. Pt has shown improvement and has been extubated and does not require pressor support.   Subjective:  Overnight: NAEON  States doing well. Had pain in the abdomen but that has improved significantly.   Objective:  Vital signs in last 24 hours: Vitals:   10/03/22 2051 10/04/22 0003 10/04/22 0500 10/04/22 0600  BP:  (!) 110/57  124/69  Pulse: 81 (!) 103  88  Resp: 16 16  16   Temp:  99.6 F (37.6 C)  99.3 F (37.4 C)  TempSrc:  Oral  Oral  SpO2: 95% 100%  100%  Weight:   (!) 139.4 kg   Height:       Supplemental O2: Nasal Cannula Last BM Date : 10/03/22 SpO2: 100 % O2 Flow Rate (L/min): 4 L/min FiO2 (%): 36 % Filed Weights   10/02/22 0301 10/03/22 0500 10/04/22 0500  Weight: (!) 139 kg (!) 141.3 kg (!) 139.4 kg    Intake/Output Summary (Last 24 hours) at 10/04/2022 0736 Last data filed at 10/04/2022 0600 Gross per 24 hour  Intake 566.56 ml  Output 2450 ml  Net -1883.44 ml   Net IO Since Admission: -1,501.01 mL [10/04/22 0736] Physical Exam General: NAD, laying in bed comfortably HENT: NCAT Lungs: CTAB Cardiovascular: IRIR Abdomen: protuberant, No TTP of the abdomen, normal bowel sounds MSK: No asymmetry, good strength in all extremities Skin: Intertrigo in groin L>R, stasis dermatitis changes on BLE.  Neuro: alert and oriented x4 Psych: normal mood and normal affect  Diagnostics CBC with no leukocytosis,  anemia with hgb around baseline 9.3. BMP ordered but last one from 07/17 with normal renal function.   Assessment/Plan: Frank Morales is a 63 y.o. with PMH of COPD, CHF, HTN presented with dysuria and admit for septic shock secondary to pyelonephritis.   Principal Problem:   Septic shock (HCC) Active Problems:   Sepsis due to urinary tract infection (HCC)   Pneumonia of both lower lobes due to infectious organism   AKI (acute kidney injury) (HCC)   Obstructive uropathy  Septic Shock 2/2 to pyelonephritis 2/2 obstructing renal calculi Septic shock resolved. Pyelonephritis resolving. On Zosyn day 4. Cultures show growth of Proteus Mirabilis. Sensitives pending. Urine culture shows <10k growth but was collected after antibiotics were started. I believe can transition to oral antibiotic starting tomorrow to complete 10-14 day course for complicated pyelonephritis given his good response and stent placement. Can tailor the antibiotic to sensitives once available.  -Continue IV abx today, switch to oral tomorrow.  Acute on Chronic Hypoxic Respiratory Failure COPD Resolved. Pt is back on his home oxygen requirement of 3.5 L. PCCM recommends 1 month follow up CT to ensure opacity has resolved. For now continue home inhalers.   Intertrigo: Nystatin powder ordered.   Chronic Problems: OSA: Has DME orders to get CPAP OP. Will order nightly CPAP.  IDA: On oral iron. Will resume. GI follow up  coming up and pt is due for colonoscopy.  CHF: Home regimen of lasix 80 mg BID, metolazone 1.5 mg weekly, and farxiga 10 mg daily. Given overall euvolemic exam, will hold lasix as pt's blood pressure this am are 93/66 but when able will resume lasix, farxiga.  PAF: resume xarelto, hold CCB given low blood pressures.   Diet: Heart Healthy IVF: Na VTE: xarelto Code: Full PT/OT recs: pending Prior to Admission Living Arrangement: Home  Anticipated Discharge Location: Home Barriers to Discharge: Medical  Stability Dispo: Anticipated discharge in approximately 2 day(s).   Gwenevere Abbot, MD Eligha Bridegroom. [facility] Internal Medicine Residency, PGY-3  Pager: [phone] After 5 pm and on weekends: Please call the on-call pager

## 2022-10-04 NOTE — Progress Notes (Signed)
   10/04/22 2045  BiPAP/CPAP/SIPAP  Reason BIPAP/CPAP not in use Non-compliant  BiPAP/CPAP /SiPAP Vitals  Pulse Rate (!) 102  Resp 18  SpO2 98 %  Bilateral Breath Sounds Diminished  MEWS Score/Color  MEWS Score 1  MEWS Score Color Green   Patient refused use of CPAP for the evening.

## 2022-10-04 NOTE — Progress Notes (Signed)
   3 Days Post-Op Subjective: 7/17: Patient was alert and responding correctly while ventilated.  Spontaneous breathing trial this morning.  Objective: Vital signs in last 24 hours: Temp:  [98.1 F (36.7 C)-99.7 F (37.6 C)] 98.1 F (36.7 C) (07/19 0800) Pulse Rate:  [52-103] 84 (07/19 1023) Resp:  [15-26] 20 (07/19 1023) BP: (88-151)/(50-84) 93/66 (07/19 1023) SpO2:  [95 %-100 %] 100 % (07/19 0814) Weight:  [139.4 kg] 139.4 kg (07/19 0500)  Intake/Output from previous day: 07/18 0701 - 07/19 0700 In: 566.6 [I.V.:105; IV Piggyback:221.6] Out: 2450 [Urine:2450]  Intake/Output this shift: Total I/O In: 328.3 [P.O.:240; IV Piggyback:88.3] Out: 560 [Urine:560]  Physical Exam:  General: Alert and oriented CV: No cyanosis Lungs: equal chest rise Abdomen: Soft, NTND, no rebound or guarding Gu: Foley in place draining clear yellow urine  Lab Results: Recent Labs    10/02/22 0146 10/02/22 2351 10/04/22 0028  HGB 10.2* 10.6* 9.3*  HCT 31.9* 33.7* 30.5*   BMET Recent Labs    10/02/22 0146 10/02/22 2351  NA 135 137  K 3.5 3.5  CL 98 99  CO2 25 28  GLUCOSE 192* 107*  BUN 27* 28*  CREATININE 1.29* 1.08  CALCIUM 8.1* 8.1*     Studies/Results: No results found.  Assessment/Plan: # Obstructing right ureteral stone # Sepsis #AKI S/p right ureteral stent placement with Dr. Alvester Morin on 10/01/2022. Extubated and hopefully leaving ICU. TOV today Scr normalized 7/17 Few colonies of Protues intra-op UC. Doesn't appear to be a significant colonization. Tailor ABX accordingly. Definitive stone mgmt on an outpt basis   LOS: 3 days   Elmon Kirschner, NP [facility] Pager: [phone]  10/04/2022, 11:32 AM

## 2022-10-04 NOTE — Progress Notes (Signed)
Occupational Therapy Treatment Patient Details Name: Frank Morales MRN: [id number] DOB: 04-01-1960 Today's Date: 10/04/2022   History of present illness 62 y.o. male presents to [facility] hospital on 10/01/2022 with weakness, SOB, and dysuria. CT demonstrates R hydronephrosis, multiple bilateral ureteral stones, pyelonephritis. Pt underwent R ureteral stent placement on 7/16 remaining intubated post-procedure. Pt extubated 7/17.Marland Kitchen PMH includes CHF, hep C, OSA, HTN, afib, COPD.   OT comments  Pt with good progress toward established OT goals. Pt performing ambulation to sink and 3 grooming tasks with use of energy conservation techniques (1.5/3 performed in sitting). Pt with decreased activity tolerance as indicated by need for performing tasks seated as well as DOE after functional mobility back to EOB. Pt SpO2 in 90s, but requirig ~1  min to recover DOE. Pt continues to benefit from skilled OT services and continuing to recommend HHOT; hopeful to progress to no follow up.   Recommendations for follow up therapy are one component of a multi-disciplinary discharge planning process, led by the attending physician.  Recommendations may be updated based on patient status, additional functional criteria and insurance authorization.    Assistance Recommended at Discharge Intermittent Supervision/Assistance  Patient can return home with the following  A little help with walking and/or transfers;A little help with bathing/dressing/bathroom;Assistance with cooking/housework;Assist for transportation;Help with stairs or ramp for entrance   Equipment Recommendations  Tub/shower seat    Recommendations for Other Services      Precautions / Restrictions Precautions Precautions: Fall Precaution Comments: monitor sats Restrictions Weight Bearing Restrictions: No       Mobility Bed Mobility               General bed mobility comments: EOB on arrival and departure    Transfers Overall transfer  level: Needs assistance Equipment used: Rolling walker (2 wheels), None Transfers: Sit to/from Stand Sit to Stand: Supervision           General transfer comment: supervision for rise     Balance Overall balance assessment: Needs assistance Sitting-balance support: No upper extremity supported, Feet supported Sitting balance-Leahy Scale: Good     Standing balance support: Single extremity supported, Bilateral upper extremity supported, Reliant on assistive device for balance Standing balance-Leahy Scale: Fair Standing balance comment: Able to walk for short distances without support                           ADL either performed or assessed with clinical judgement   ADL Overall ADL's : Needs assistance/impaired     Grooming: Wash/dry hands;Wash/dry face;Sitting;Standing;Set up;Supervision/safety;Brushing hair Grooming Details (indicate cue type and reason): shaving face in sitting with set-up A. Washing face in standing with supervision. Performing hair care in both sitting and standing.             Lower Body Dressing: Set up;Sitting/lateral leans Lower Body Dressing Details (indicate cue type and reason): donning socks             Functional mobility during ADLs: Min guard      Extremity/Trunk Assessment Upper Extremity Assessment Upper Extremity Assessment: Generalized weakness   Lower Extremity Assessment Lower Extremity Assessment: Defer to PT evaluation        Vision   Additional Comments: Pt squinting to read his blood pressure on dynamap at EOB   Perception     Praxis      Cognition Arousal/Alertness: Awake/alert Behavior During Therapy: WFL for tasks assessed/performed Overall Cognitive Status: Within Functional Limits  for tasks assessed                                 General Comments: Recalling therapist from day prior and fair insight to determine when rest breaks are needed to maintain SpO2        Exercises       Shoulder Instructions       General Comments VSS on 3L [place] BP 105/61 (76) at start of session and 110/67 (81) at end of session    Pertinent Vitals/ Pain       Pain Assessment Pain Assessment: No/denies pain  Home Living                                          Prior Functioning/Environment              Frequency  Min 2X/week        Progress Toward Goals  OT Goals(current goals can now be found in the care plan section)  Progress towards OT goals: Progressing toward goals  Acute Rehab OT Goals Patient Stated Goal: get better OT Goal Formulation: With patient Time For Goal Achievement: 10/17/22 Potential to Achieve Goals: Good ADL Goals Pt Will Perform Grooming: with modified independence;standing Pt Will Perform Lower Body Dressing: with modified independence;sit to/from stand Pt Will Transfer to Toilet: with supervision;ambulating Pt Will Perform Tub/Shower Transfer: Tub transfer;with supervision;ambulating Additional ADL Goal #1: Pt will perform 3 consecutive ADL in standing to optimize activity tolerance.  Plan Discharge plan remains appropriate;Frequency remains appropriate    Co-evaluation                 AM-PAC OT "6 Clicks" Daily Activity     Outcome Measure   Help from another person eating meals?: None Help from another person taking care of personal grooming?: A Little Help from another person toileting, which includes using toliet, bedpan, or urinal?: A Little Help from another person bathing (including washing, rinsing, drying)?: A Little Help from another person to put on and taking off regular upper body clothing?: A Little Help from another person to put on and taking off regular lower body clothing?: A Little 6 Click Score: 19    End of Session Equipment Utilized During Treatment: Oxygen (3.5L via Dawn)  OT Visit Diagnosis: Unsteadiness on feet (R26.81);Muscle weakness (generalized) (M62.81)   Activity  Tolerance Patient tolerated treatment well   Patient Left Other (comment) (EOB with RN aware as pt EOB on arrival as well)   Nurse Communication Mobility status        Time: 1027-2536 OT Time Calculation (min): 22 min  Charges: OT General Charges $OT Visit: 1 Visit OT Treatments $Self Care/Home Management : 8-22 mins  Tyler Deis, OTR/L [facility] Acute Rehabilitation Office: [phone]   Myrla Halsted 10/04/2022, 4:05 PM

## 2022-10-05 LAB — RENAL FUNCTION PANEL
Albumin: 2.2 g/dL — ABNORMAL LOW (ref 3.5–5.0)
Anion gap: 9 (ref 5–15)
BUN: 9 mg/dL (ref 6–20)
CO2: 33 mmol/L — ABNORMAL HIGH (ref 22–32)
Calcium: 8.2 mg/dL — ABNORMAL LOW (ref 8.9–10.3)
Chloride: 92 mmol/L — ABNORMAL LOW (ref 98–111)
Creatinine, Ser: 0.71 mg/dL (ref 0.61–1.24)
GFR, Estimated: >60 mL/min (ref >60)
Glucose, Bld: 115 mg/dL — ABNORMAL HIGH (ref 70–99)
Phosphorus: 3.3 mg/dL (ref 2.5–4.6)
Potassium: 3 mmol/L — ABNORMAL LOW (ref 3.5–5.1)
Sodium: 134 mmol/L — ABNORMAL LOW (ref 135–145)

## 2022-10-05 LAB — CBC
HCT: 30 % — ABNORMAL LOW (ref 39.0–52.0)
Hemoglobin: 9.3 g/dL — ABNORMAL LOW (ref 13.0–17.0)
MCH: 25.3 pg — ABNORMAL LOW (ref 26.0–34.0)
MCHC: 31 g/dL (ref 30.0–36.0)
MCV: 81.7 fL (ref 80.0–100.0)
Platelets: 178 10*3/uL (ref 150–400)
RBC: 3.67 MIL/uL — ABNORMAL LOW (ref 4.22–5.81)
RDW: 22.7 % — ABNORMAL HIGH (ref 11.5–15.5)
WBC: 5.1 10*3/uL (ref 4.0–10.5)
nRBC: 0 % (ref 0.0–0.2)

## 2022-10-05 LAB — TECHNOLOGIST SMEAR REVIEW

## 2022-10-05 LAB — CULTURE, BLOOD (ROUTINE X 2): Culture: NO GROWTH

## 2022-10-05 LAB — IMMATURE PLATELET FRACTION: Immature Platelet Fraction: 6.6 % (ref 1.2–8.6)

## 2022-10-05 MED ORDER — AMOXICILLIN-POT CLAVULANATE 875-125 MG PO TABS
1.0000 | ORAL_TABLET | Freq: Two times a day (BID) | ORAL | Status: DC
  Administered 2022-10-05 – 2022-10-06 (×3): 1 via ORAL
  Filled 2022-10-05 (×4): qty 1

## 2022-10-05 MED ORDER — POTASSIUM CHLORIDE CRYS ER 20 MEQ PO TBCR
40.0000 meq | EXTENDED_RELEASE_TABLET | Freq: Two times a day (BID) | ORAL | Status: AC
  Administered 2022-10-05 (×2): 40 meq via ORAL
  Filled 2022-10-05 (×2): qty 2

## 2022-10-05 NOTE — Progress Notes (Signed)
Physical Therapy Treatment Patient Details Name: Frank Morales MRN: [id number] DOB: October 18, 1960 Today's Date: 10/05/2022   History of Present Illness 62 y.o. male presents to [facility] hospital on 10/01/2022 with weakness, SOB, and dysuria. CT demonstrates R hydronephrosis, multiple bilateral ureteral stones, pyelonephritis. Pt underwent R ureteral stent placement on 7/16 remaining intubated post-procedure. Pt extubated 7/17.Marland Kitchen PMH includes CHF, hep C, OSA, HTN, afib, COPD.    PT Comments  Pt greeted up in chair on arrival and agreeable to session with good progress towards acute goals. Pt able to progress gait in hall this session >200' with RW for support and min guard assist for safety. Pt benefiting from cues for upright posture and RW management as pt with tendency to flex trunk and step outside RW when turning. BP soft during session, however pt without c/o dizziness/lightheadedness during session. SpO2 >97% throughout session on 3LO2, 2/4 DOE noted at end of gait once seated EOB, resolving with rest. Continued education on energy conservation techniques, self pacing and RW use with pt verbalizing understanding. Current plan remains appropriate to address deficits and maximize functional independence.  Pt continues to benefit from skilled PT services to progress toward functional mobility goals.      Assistance Recommended at Discharge PRN  If plan is discharge home, recommend the following:  Can travel by private vehicle    A little help with bathing/dressing/bathroom;Assistance with cooking/housework;Assist for transportation;Help with stairs or ramp for entrance      Equipment Recommendations  Rolling walker (2 wheels)    Recommendations for Other Services       Precautions / Restrictions Precautions Precautions: Fall Precaution Comments: monitor sats Restrictions Weight Bearing Restrictions: Yes     Mobility  Bed Mobility Overal bed mobility: Needs Assistance              General bed mobility comments: pt up in chair on arrival, seated EOB at end of session    Transfers Overall transfer level: Needs assistance Equipment used: Rolling walker (2 wheels), None Transfers: Sit to/from Stand Sit to Stand: Supervision           General transfer comment: supervision for safety, steady rise to RW    Ambulation/Gait Ambulation/Gait assistance: Min guard, +2 safety/equipment (+2 for chair follow for safety but not needed) Gait Distance (Feet): 240 Feet (x1 standing rest) Assistive device: Rolling walker (2 wheels) Gait Pattern/deviations: Step-through pattern Gait velocity: reduced     General Gait Details: steady gait with RW support, cues for upright posture and forward gaze, klight cues for proximity to RW and keep feet inside RW as LLE outside RW when turning 180 degrees to return to room, x1 standing rest, noted DOE at end fo gait trial SpO2 98%   Stairs             Wheelchair Mobility     Tilt Bed    Modified Rankin (Stroke Patients Only)       Balance Overall balance assessment: Needs assistance Sitting-balance support: No upper extremity supported, Feet supported Sitting balance-Leahy Scale: Good     Standing balance support: Single extremity supported, Bilateral upper extremity supported, Reliant on assistive device for balance Standing balance-Leahy Scale: Fair Standing balance comment: BUE during dynamic tasks                            Cognition Arousal/Alertness: Awake/alert Behavior During Therapy: WFL for tasks assessed/performed Overall Cognitive Status: Within Functional Limits for tasks assessed  General Comments: pleasant and particapatory throughout session        Exercises      General Comments General comments (skin integrity, edema, etc.): VSS on 3L O2, no c/o dizziness/lightheadedness, 2/4DOE noted at end of gait once seated EOB, resolving with  rest      Pertinent Vitals/Pain Pain Assessment Pain Assessment: No/denies pain    Home Living                          Prior Function            PT Goals (current goals can now be found in the care plan section) Acute Rehab PT Goals Patient Stated Goal: to go home PT Goal Formulation: With patient Time For Goal Achievement: 10/16/22 Progress towards PT goals: Progressing toward goals    Frequency    Min 3X/week      PT Plan Current plan remains appropriate    Co-evaluation              AM-PAC PT "6 Clicks" Mobility   Outcome Measure  Help needed turning from your back to your side while in a flat bed without using bedrails?: A Little Help needed moving from lying on your back to sitting on the side of a flat bed without using bedrails?: A Little Help needed moving to and from a bed to a chair (including a wheelchair)?: A Little Help needed standing up from a chair using your arms (e.g., wheelchair or bedside chair)?: A Little Help needed to walk in hospital room?: A Little Help needed climbing 3-5 steps with a railing? : A Lot 6 Click Score: 17    End of Session Equipment Utilized During Treatment: Oxygen Activity Tolerance: Patient tolerated treatment well Patient left: with call bell/phone within reach;in bed;Other (comment) (seated EOB) Nurse Communication: Mobility status;Other (comment) (pt IV out, pt need for transport at d/c) PT Visit Diagnosis: Other abnormalities of gait and mobility (R26.89);Muscle weakness (generalized) (M62.81)     Time: 1607-3710 PT Time Calculation (min) (ACUTE ONLY): 19 min  Charges:    $Gait Training: 8-22 mins PT General Charges $$ ACUTE PT VISIT: 1 Visit                     Vivan Vanderveer R. PTA Acute Rehabilitation Services Office: [phone]   Catalina Antigua 10/05/2022, 2:54 PM

## 2022-10-05 NOTE — Plan of Care (Signed)

## 2022-10-05 NOTE — Progress Notes (Addendum)
Summary  Frank Morales is a 62 y.o. 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 Admit for septic shock secondary to pyelonephritis.   Imaging showed pyelonephritis. Pt was admitted to the ICU given his lactic acid was elevated and he was hypotensive. CXR showed pulmonary consolidation and empiric PNA treatment was started. In the ICU pt required vent support and pressor support. Pt was taken to OR by urology for right ureteral stent placement. Pt has shown improvement and has been extubated and does not require pressor support.    Subjective:  Today the patient reports feeling much better. The patient states that his breathing is back to his baseline. The patient denies any abdominal pain, shortness of breath, pain with urination, chills, fevers, body aches.  Patient noted that he feels like he has been on so many medications during his hospitalization that he hasn't been very aware of what is going on. He reported that he took less medicine yesterday and feels much more like himself today. The patient stated that his appetite has returned and he is eager to eat. The patient reported that his legs feel great and that he has much less swelling from his HF than he usually does.   Objective:  Vital signs in last 24 hours: Vitals:   10/04/22 1958 10/04/22 2045 10/05/22 0436 10/05/22 0500  BP: 133/82  117/83   Pulse: (!) 104 (!) 102 96   Resp: 18 18 18    Temp: 98.2 F (36.8 C)  98.2 F (36.8 C)   TempSrc: Oral  Oral   SpO2: 100% 98% 100%   Weight:    (!) 140.2 kg  Height:       Weight change: 0.8 kg  Intake/Output Summary (Last 24 hours) at 10/05/2022 0559 Last data filed at 10/05/2022 0440 Gross per 24 hour  Intake 1063.1 ml  Output 3760 ml  Net -2696.9 ml      Latest Ref Rng & Units 10/05/2022    1:22 AM 10/04/2022   11:19 PM 10/04/2022   12:28 AM  CBC  WBC 4.0 - 10.5 K/uL 5.1  6.5  5.6   Hemoglobin 13.0 -  17.0 g/dL 9.3  9.9  9.3   Hematocrit 39.0 - 52.0 % 30.0  32.1  30.5   Platelets 150 - 400 K/uL 178  180  187       Latest Ref Rng & Units 10/04/2022    6:02 PM 10/02/2022   11:51 PM 10/02/2022    1:46 AM  CMP  Glucose 70 - 99 mg/dL 93  161  096   BUN 6 - 20 mg/dL 13  28  27    Creatinine 0.61 - 1.24 mg/dL 0.45  4.09  8.11   Sodium 135 - 145 mmol/L 136  137  135   Potassium 3.5 - 5.1 mmol/L 3.2  3.5  3.5   Chloride 98 - 111 mmol/L 98  99  98   CO2 22 - 32 mmol/L 30  28  25    Calcium 8.9 - 10.3 mg/dL 8.2  8.1  8.1      Proteus mirabilis      MIC    AMPICILLIN <=2 SENSITIVE Sensitive    AMPICILLIN/SULBACTAM <=2 SENSITIVE Sensitive    CEFEPIME <=0.12 SENS... Sensitive    CEFTAZIDIME <=1 SENSITIVE Sensitive    CEFTRIAXONE <=0.25 SENS... Sensitive    CIPROFLOXACIN <=0.25 SENS... Sensitive    GENTAMICIN <=1 SENSITIVE  Sensitive    IMIPENEM 4 SENSITIVE Sensitive    PIP/TAZO <=4 SENSITIVE Sensitive    TRIMETH/SULFA <=20 SENSIT... Sensitive     Physical Exam Constitutional:      General: He is awake.     Appearance: Normal appearance. He is well-developed. He is obese.  Cardiovascular:     Rate and Rhythm: Normal rate.     Heart sounds: Normal heart sounds.  Pulmonary:     Effort: Pulmonary effort is normal.     Breath sounds: Normal breath sounds.  Abdominal:     General: Bowel sounds are normal.     Palpations: Abdomen is soft.  Musculoskeletal:     Right lower leg: No edema.     Left lower leg: No edema.  Skin:    General: Skin is warm and dry.  Neurological:     Mental Status: He is alert.      Assessment/Plan:  Principal Problem:   Septic shock (HCC) Active Problems:   Sepsis due to urinary tract infection (HCC)   Acute on chronic hypoxic respiratory failure   COPD   Obstructive uropathy  Septic Shock 2/2 to pyelonephritis 2/2 obstructing renal calculi Septic shock resolved. Pyelonephritis resolving. On IV Unasyn (day 5 of total antibiotics). Cultures show  growth of pan sensitive Proteus Mirabilis. Urine culture shows <10k growth but was collected after antibiotics were started. Patient reports that he feels much better. Physical exam was reassuring. Will proceed with switch to oral antibiotics and monitor for 24 hours. If he does well on antibiotics may discharge tomorrow. -ordered Augmentin 8725-125mg  BID for 7 days   Acute on Chronic Hypoxic Respiratory Failure, resolved COPD Pt is back on his home oxygen requirement of 3.5 L.  -PCCM recommends 1 month follow up CT to ensure opacity has resolved.  -For now continue home inhalers.   Hypertension Patients blood pressures have been low. Cardizem has been held until blood pressures rise.   Intertrigo: Nystatin powder ordered.   Hypokalemia Patients K was 3.2 yesterday afternoon. Awaiting RFP.   - BID of PO K chlor   Chronic Problems: OSA: Has DME orders to get CPAP OP. Will order nightly CPAP.  IDA: On oral iron. Will resume. GI follow up coming up and pt is due for colonoscopy.  CHF: Home regimen of lasix 80 mg BID, metolazone 1.5 mg weekly, and farxiga 10 mg daily. Lasix and Marcelline Deist held due to low blood pressures and euvolemic status. Patients legs appeared well today. Continue to hold. PAF: resume xarelto, hold CCB given low blood pressures.  History of Hep C: Liver US follow up   Diet: Heart Healthy IVF: Na VTE: xarelto Code: Full PT/OT recs: pending Prior to Admission Living Arrangement: Home  Anticipated Discharge Location: Home Barriers to Discharge: Medical Stability Dispo: Anticipated discharge in approximately 2 day(s).   LOS: 4 days   Crosby Oyster, Medical Student 10/05/2022, 5:59 AM

## 2022-10-05 NOTE — Progress Notes (Signed)
Patient refused CPAP. States he wears O2 all the time only.

## 2022-10-06 DIAGNOSIS — A419 Sepsis, unspecified organism: Secondary | ICD-10-CM | POA: Diagnosis not present

## 2022-10-06 DIAGNOSIS — R6521 Severe sepsis with septic shock: Secondary | ICD-10-CM | POA: Diagnosis not present

## 2022-10-06 DIAGNOSIS — N390 Urinary tract infection, site not specified: Secondary | ICD-10-CM

## 2022-10-06 DIAGNOSIS — J9611 Chronic respiratory failure with hypoxia: Secondary | ICD-10-CM | POA: Insufficient documentation

## 2022-10-06 DIAGNOSIS — N139 Obstructive and reflux uropathy, unspecified: Secondary | ICD-10-CM | POA: Diagnosis not present

## 2022-10-06 LAB — CBC
HCT: 29.8 % — ABNORMAL LOW (ref 39.0–52.0)
Hemoglobin: 9 g/dL — ABNORMAL LOW (ref 13.0–17.0)
MCH: 24.8 pg — ABNORMAL LOW (ref 26.0–34.0)
MCHC: 30.2 g/dL (ref 30.0–36.0)
MCV: 82.1 fL (ref 80.0–100.0)
Platelets: 235 10*3/uL (ref 150–400)
RBC: 3.63 MIL/uL — ABNORMAL LOW (ref 4.22–5.81)
RDW: 22.6 % — ABNORMAL HIGH (ref 11.5–15.5)
WBC: 5.7 10*3/uL (ref 4.0–10.5)
nRBC: 0 % (ref 0.0–0.2)

## 2022-10-06 LAB — RENAL FUNCTION PANEL
Albumin: 2.2 g/dL — ABNORMAL LOW (ref 3.5–5.0)
Anion gap: 8 (ref 5–15)
BUN: 8 mg/dL (ref 6–20)
CO2: 32 mmol/L (ref 22–32)
Calcium: 8.2 mg/dL — ABNORMAL LOW (ref 8.9–10.3)
Chloride: 94 mmol/L — ABNORMAL LOW (ref 98–111)
Creatinine, Ser: 0.67 mg/dL (ref 0.61–1.24)
GFR, Estimated: >60 mL/min (ref >60)
Glucose, Bld: 120 mg/dL — ABNORMAL HIGH (ref 70–99)
Phosphorus: 3 mg/dL (ref 2.5–4.6)
Potassium: 3.3 mmol/L — ABNORMAL LOW (ref 3.5–5.1)
Sodium: 134 mmol/L — ABNORMAL LOW (ref 135–145)

## 2022-10-06 LAB — AEROBIC/ANAEROBIC CULTURE W GRAM STAIN (SURGICAL/DEEP WOUND)

## 2022-10-06 LAB — CULTURE, BLOOD (ROUTINE X 2): Special Requests: ADEQUATE

## 2022-10-06 LAB — MAGNESIUM: Magnesium: 1.8 mg/dL (ref 1.7–2.4)

## 2022-10-06 MED ORDER — AMOXICILLIN-POT CLAVULANATE 875-125 MG PO TABS: 1.0000 | ORAL_TABLET | Freq: Two times a day (BID) | ORAL | 0 refills | Status: AC

## 2022-10-06 NOTE — Hospital Course (Addendum)
Mr. Niranjan, Rufener were admitted to the hospital because you had a severe infection of your kidneys after a 1/4 cm stone was found in your kidneys that caused your blood pressure to be low. Because of this, you were admitted to the critical care unit where they gave you medications to keep your blood pressure elevated and were intubated. You were also treated with antibiotics and a prodcedure by the kidney doctors, called Urologists. They placed a stent in your ureter to help you drain your urine, especially with the stone in your right kidney.   Now you are stable to discharged home. You will need a 5-day course of antibiotics called Augmentin - every 12 hours starting tonight.   You will need treatment of that stone in your right kidney. The urologists would like to see your in their office in the next couple of weeks to follow up with your symptoms after hospital discharge.    Make an appointment by calling them at: [phone]. Their offices are located at:  [address] [place], [place], [place] [address]  Unfortunately we cannot schedule an appointment prior to discharging you from the hospital. Please call [phone] to make an appointment at the [facility] in the next 1-2 weeks. Please call them as well should you have any return of fevers, need more supplemental oxygen, or the abdominal pain or discomfort with urination returns.   Take care of yourself. Thank you for allowing [place] to care for you,  Morene Crocker, MD

## 2022-10-06 NOTE — TOC Transition Note (Signed)
Transition of Care [facility]) - CM/SW Discharge Note   Patient Details  Name: Frank Morales MRN: [id number] Date of Birth: May 15, 1960  [facility]) CM/SW Contact:  Ronny Bacon, RN Phone Number: 10/06/2022, 10:03 AM   Clinical Narrative:  Provider reached out to inform that patient will be discharged home today. Spoke with patient by phone, patient confirms that he has a RW and shower chair at home. Patient request oxygen tank and transportation for home at discharge. Spoke with [employer] liaison who will have delivery driver bring oxygen tank to patient bedside. Cab voucher given to floor RN for patient use at discharge. Confirmed with Laurelyn Sickle- [facility] that patient is accepted for [facility]. Medicaid transportation information added to AVS.    Final next level of care: Home w Home Health Services Barriers to Discharge: No Barriers Identified   Patient Goals and CMS Choice      Discharge Placement                         Discharge Plan and Services Additional resources added to the After Visit Summary for                  DME Arranged: Oxygen DME Agency: Patsy Lager Date DME Agency Contacted: 10/06/22 Time DME Agency Contacted: [phone] Representative spoke with at DME Agency: on call line            Social Determinants of Health (SDOH) Interventions SDOH Screenings   Food Insecurity: No Food Insecurity (04/16/2022)  Housing: Low Risk  (04/16/2022)  Transportation Needs: Unmet Transportation Needs (04/16/2022)  Utilities: Not At Risk (04/16/2022)  Depression (PHQ2-9): Low Risk  (08/28/2022)  Social Connections: Socially Isolated (04/16/2022)  Tobacco Use: High Risk (10/01/2022)     Readmission Risk Interventions    10/02/2022    1:06 PM  Readmission Risk Prevention Plan  Transportation Screening Complete  PCP or Specialist Appt within 5-7 Days Complete  Home Care Screening Complete  Medication Review (RN CM) Referral to Pharmacy

## 2022-10-06 NOTE — Plan of Care (Signed)
  Problem: Education: Goal: Knowledge of General Education information will improve Description: Including pain rating scale, medication(s)/side effects and non-pharmacologic comfort measures Outcome: Adequate for Discharge   Problem: Health Behavior/Discharge Planning: Goal: Ability to manage health-related needs will improve Outcome: Adequate for Discharge   Problem: Clinical Measurements: Goal: Ability to maintain clinical measurements within normal limits will improve Outcome: Adequate for Discharge Goal: Will remain free from infection Outcome: Adequate for Discharge Goal: Diagnostic test results will improve Outcome: Adequate for Discharge Goal: Respiratory complications will improve Outcome: Adequate for Discharge Goal: Cardiovascular complication will be avoided Outcome: Adequate for Discharge   Problem: Activity: Goal: Risk for activity intolerance will decrease Outcome: Adequate for Discharge   Problem: Nutrition: Goal: Adequate nutrition will be maintained Outcome: Adequate for Discharge   Problem: Coping: Goal: Level of anxiety will decrease Outcome: Adequate for Discharge   Problem: Elimination: Goal: Will not experience complications related to bowel motility Outcome: Adequate for Discharge Goal: Will not experience complications related to urinary retention Outcome: Adequate for Discharge   Problem: Pain Managment: Goal: General experience of comfort will improve Outcome: Adequate for Discharge   Problem: Safety: Goal: Ability to remain free from injury will improve Outcome: Adequate for Discharge   Problem: Skin Integrity: Goal: Risk for impaired skin integrity will decrease Outcome: Adequate for Discharge   Problem: Activity: Goal: Ability to tolerate increased activity will improve Outcome: Adequate for Discharge   Problem: Respiratory: Goal: Ability to maintain a clear airway and adequate ventilation will improve Outcome: Adequate for  Discharge   Problem: Role Relationship: Goal: Method of communication will improve Outcome: Adequate for Discharge   Problem: Education: Goal: Ability to describe self-care measures that may prevent or decrease complications (Diabetes Survival Skills Education) will improve Outcome: Adequate for Discharge Goal: Individualized Educational Video(s) Outcome: Adequate for Discharge   Problem: Coping: Goal: Ability to adjust to condition or change in health will improve Outcome: Adequate for Discharge   Problem: Fluid Volume: Goal: Ability to maintain a balanced intake and output will improve Outcome: Adequate for Discharge   Problem: Health Behavior/Discharge Planning: Goal: Ability to identify and utilize available resources and services will improve Outcome: Adequate for Discharge Goal: Ability to manage health-related needs will improve Outcome: Adequate for Discharge   Problem: Metabolic: Goal: Ability to maintain appropriate glucose levels will improve Outcome: Adequate for Discharge   Problem: Nutritional: Goal: Maintenance of adequate nutrition will improve Outcome: Adequate for Discharge Goal: Progress toward achieving an optimal weight will improve Outcome: Adequate for Discharge   Problem: Skin Integrity: Goal: Risk for impaired skin integrity will decrease Outcome: Adequate for Discharge   Problem: Tissue Perfusion: Goal: Adequacy of tissue perfusion will improve Outcome: Adequate for Discharge

## 2022-10-06 NOTE — Discharge Summary (Addendum)
Name: Frank Morales MRN: [id number] DOB: Oct 31, 1960 62 y.o. PCP: Olegario Messier, MD  Date of Admission: 10/01/2022 10:23 AM Date of Discharge: 10/06/2022 11:24 AM Attending Physician: Dr. Criselda Peaches  Discharge Diagnosis: Principal Problem:   Septic shock [name] Active Problems:   Morbid obesity (HCC)   OSA (obstructive sleep apnea)   Permanent atrial fibrillation   COPD (chronic obstructive pulmonary disease) (HCC)   Sepsis due to urinary tract infection (HCC)   Pneumonia of both lower lobes due to infectious organism   AKI (acute kidney injury) (HCC)   Obstructive uropathy   Pyelonephritis   Chronic hypoxic respiratory failure (HCC)    Discharge Medications: Allergies as of 10/06/2022       Reactions   Lactose Intolerance (gi) Nausea And Vomiting        Medication List     TAKE these medications    albuterol 108 (90 Base) MCG/ACT inhaler Commonly known as: VENTOLIN HFA Inhale 2 puffs into the lungs every 6 (six) hours as needed for wheezing or shortness of breath.   amoxicillin-clavulanate 875-125 MG tablet Commonly known as: AUGMENTIN Take 1 tablet by mouth every 12 (twelve) hours for 5 days.   Anoro Ellipta 62.5-25 MCG/ACT Aepb Generic drug: umeclidinium-vilanterol INHALE 1 PUFF INTO THE LUNGS DAILY   azelastine 0.1 % nasal spray Commonly known as: ASTELIN USE 2 SPRAYS IN EACH NOSTRIL TWICE DAILY   dapagliflozin propanediol 10 MG Tabs tablet Commonly known as: Farxiga TAKE 1 TABLET(10 MG) BY MOUTH DAILY BEFORE BREAKFAST   diltiazem 240 MG 24 hr capsule Commonly known as: CARDIZEM CD TAKE 2 CAPSULES(480 MG) BY MOUTH DAILY   ferrous sulfate 325 (65 FE) MG tablet Take 1 tablet (325 mg total) by mouth every Monday, Wednesday, and Friday.   fluticasone 110 MCG/ACT inhaler Commonly known as: FLOVENT HFA Inhale 1 puff into the lungs daily.   furosemide 80 MG tablet Commonly known as: LASIX TAKE 1 TABLET(80 MG) BY MOUTH TWICE DAILY   levocetirizine  5 MG tablet Commonly known as: XYZAL Take 1 tablet (5 mg total) by mouth every evening.   metolazone 2.5 MG tablet Commonly known as: ZAROXOLYN Take 1 tablet (2.5 mg total) by mouth once a week. Take every Wednesday. What changed:  when to take this additional instructions   pantoprazole 20 MG tablet Commonly known as: Protonix Take 1 tablet (20 mg total) by mouth daily.   potassium chloride SA 20 MEQ tablet Commonly known as: KLOR-CON M Take 2 tablets (40 mEq total) by mouth 2 (two) times daily.   Xarelto 20 MG Tabs tablet Generic drug: rivaroxaban TAKE 1 TABLET(20 MG) BY MOUTH DAILY WITH SUPPER        Disposition and follow-up:   Frank Morales was discharged from [facility] in [place] condition.  At the hospital follow up visit please address:  1.  Follow-up:  *Sepsis from a urinary source 2/2 UPJ stone s/p stent placement in R ureter -Ensure completion of antibiotic therapy -Monitor to stability of symptoms -Ensure follow up with urology inpatient  *COPD *Acute hypoxic respiratory failure -Multiple blebs seen on CT chest; recommend follow up in 1 month  *HFrEF -Patient instructed to resume diuretics on Tuesday or earlier if evidence of volume overload  *pAF -Continued on AC  2.  Labs / imaging needed at time of follow-up: BMP  3.  Pending labs/ test needing follow-up: none  4.  Medication Changes    ADDED  -Augmentin BID for 5 days  MODIFIED  - Restart diuretics on Tuesday 10/08/2022  Follow-up Appointments:  Follow-up Information     [facility].   Specialty: Home Health Services Why: Someone will call you to schedule first home visit. If you have not received a call after two days of discharging home, call their number listed. If no one comes to assess, call Case Manager at [phone]. Contact information: [address] [place] [address] [phone]         [facility]. Schedule an appointment as soon as possible for a visit today.   Contact information: [address] [place] [address] [place] [address] [phone]                Hospital Course by problem list:  Septic shock Obstructing R UPJ nephrolithiasis Pyelonephritis Presented to the hospital with generalized weakness on 7/16, found to be in septic shock in the ED, requiring ICU admission. Evaluation revealed an obstructing 1.4cm UPJ stone and RLL PNA. Patient was started on broad spectrum antibiotics, started on vasopressor support, and placed on mechanical ventilation. Patient underwent stent placement by urology. He was transferred to the IMTS on 7/19 once off of vasopressor support and at baseline 3.5L of supplemental O2. Cultures show growth of Proteus Mirabilis, susceptible to augmentin therapy. Patient was discharged on this therapy to complete a 10 day course of antibiotics. He will require close urology follow up for renal lith monitoring and surgical planning. Precautionary symptoms to seek medical attention were reviewed.  COPD OSA/OHS overlap syndrome Acute on Chronic Hypoxic respiratory failure RLL pneumonia S/p treatment. By day of discharge, patient was on his baseline supplemental oxygen requirement of 3.5 L. CT chest revealed multiple blebs and bullae in the apices, more so on the right side.  Chronic medical conditions OSA: Has DME orders to get CPAP OP IDA: On oral iron. Will resume. GI follow up coming up and pt is due for colonoscopy.  CHF: Resume [place]. Holding Lasix 80 mg BID, metolazone 1.5 mg weekly starting on Tuesday given that his blood pressures have been low normal and patient is not volume overloaded on discharge exam.e lasix, farxiga.  PAF: Resumed therapy at discharge  Discharge Subjective: Patient feeling better, able to resume his usual level of activity. Denies chest pain, shortness of breath, pain with urination, abdominal or flank  pain. He also denies blood in urine.   Discharge Exam:   Blood pressure (!) 87/63, pulse 82, temperature 98.6 F (37 C), temperature source Oral, resp. rate 18, height 5\' 11"  (1.803 m), weight (!) 140 kg, SpO2 100%.  Constitutional: Elderly HENT: normocephalic atraumatic, mucous membranes moist Cardiovascular: regular rate and rhythm, no m/r/g Pulmonary/Chest: normal work of breathing on room air, lungs clear to auscultation bilaterally. Abdominal: soft, non-tender, non-distended.  Neurological: alert & oriented x 3 MSK: no gross abnormalities. No pitting edema Skin: warm and dry Psych: Normal mood and affect  Pertinent Labs, Studies, and Procedures:     Latest Ref Rng & Units 10/06/2022    7:25 AM 10/05/2022    1:22 AM 10/04/2022   11:19 PM  CBC  WBC 4.0 - 10.5 K/uL 5.7  5.1  6.5   Hemoglobin 13.0 - 17.0 g/dL 9.0  9.3  9.9   Hematocrit 39.0 - 52.0 % 29.8  30.0  32.1   Platelets 150 - 400 K/uL 235  178  180        Latest Ref Rng & Units 10/06/2022  7:25 AM 10/05/2022    7:43 AM 10/04/2022    6:02 PM  CMP  Glucose 70 - 99 mg/dL 161  096  93   BUN 6 - 20 mg/dL 8  9  13    Creatinine 0.61 - 1.24 mg/dL 0.45  4.09  8.11   Sodium 135 - 145 mmol/L 134  134  136   Potassium 3.5 - 5.1 mmol/L 3.3  3.0  3.2   Chloride 98 - 111 mmol/L 94  92  98   CO2 22 - 32 mmol/L 32  33  30   Calcium 8.9 - 10.3 mg/dL 8.2  8.2  8.2     Portable Chest x-ray  Result Date: 10/01/2022 CLINICAL DATA:  Endotracheal tube placement EXAM: PORTABLE CHEST 1 VIEW COMPARISON:  10/01/2022 FINDINGS: Endotracheal tube has been placed with tip measuring 5.3 cm above the carina. Shallow inspiration. Cardiac enlargement. Mild perihilar infiltration. Small bilateral pleural effusions. No pneumothorax. IMPRESSION: Endotracheal tube tip placed with tip measuring 5.3 cm above the carina. Persistent cardiac enlargement, perihilar infiltration, and bilateral pleural effusions. Electronically Signed   By: Burman Nieves M.D.    On: 10/01/2022 22:14   DG C-Arm 1-60 Min  Result Date: 10/01/2022 CLINICAL DATA:  Cystoscopy with ureteroscopy and stent placement. EXAM: DG C-ARM 1-60 MIN FLUOROSCOPY: Fluoroscopy Time:  14 seconds Radiation Exposure Index (if provided by the fluoroscopic device): 74.54 mGy Number of Acquired Spot Images: 9 COMPARISON:  10/01/2022. FINDINGS: Severe hydronephrosis is present on the right with interval placement of right ureteral stent. Please see operative report for additional information. IMPRESSION: Intraoperative utilization of fluoroscopy. Electronically Signed   By: Thornell Sartorius M.D.   On: 10/01/2022 21:11   DG Abd Portable 1V  Result Date: 10/01/2022 CLINICAL DATA:  OG tube placement EXAM: PORTABLE ABDOMEN - 1 VIEW COMPARISON:  CT chest/abdomen/pelvis 1 day prior FINDINGS: The enteric catheter tip is in the stomach. There is a paucity of bowel gas in the imaged abdomen without evidence of mechanical obstruction. A right ureteral stent is in place. IMPRESSION: Enteric catheter tip in the stomach. Electronically Signed   By: Lesia Hausen M.D.   On: 10/01/2022 19:16   CT CHEST ABDOMEN PELVIS WO CONTRAST  Result Date: 10/01/2022 CLINICAL DATA:  Sepsis, shortness of breath EXAM: CT CHEST, ABDOMEN AND PELVIS WITHOUT CONTRAST TECHNIQUE: Multidetector CT imaging of the chest, abdomen and pelvis was performed following the standard protocol without IV contrast. RADIATION DOSE REDUCTION: This exam was performed according to the departmental dose-optimization program which includes automated exposure control, adjustment of the mA and/or kV according to patient size and/or use of iterative reconstruction technique. COMPARISON:  05/16/2021 FINDINGS: CT CHEST FINDINGS Cardiovascular: Heart is enlarged in size. There are scattered coronary artery calcifications. There is no evidence of pericardial effusion. Mediastinum/Nodes: There are few slightly enlarged lymph nodes in mediastinum. Lungs/Pleura: There are  patchy infiltrates in both lower lung fields, more so on the right side suggesting atelectasis/pneumonia. There is interval worsening of this finding. There is 3.2 cm lobular density in the lateral aspect of right lower lung field. Small right pleural effusion is seen. Rest of the lung fields show no focal infiltrates. There is no pneumothorax.There are multiple blebs and bullae in the upper lung fields, more so on the right side. Musculoskeletal: No acute findings are seen. CT ABDOMEN PELVIS FINDINGS Hepatobiliary: No focal abnormalities are seen in liver. There is no dilation of bile ducts. Gallbladder is unremarkable. Pancreas: No focal abnormalities are seen. Spleen:  Unremarkable. Adrenals/Urinary Tract: Adrenals are unremarkable. There is interval appearance of marked right hydronephrosis. There is 1.4 cm calculus at the right ureteropelvic junction. There are multiple other bilateral renal stones, more so in the right kidney. Largest other renal stones measures 3.1 cm in maximum diameter in the midportion of right kidney. There is significant right perinephric stranding. There are no loculated fluid collections. Urinary bladder is unremarkable. Stomach/Bowel: Stomach is unremarkable. Small bowel loops are not dilated. Appendix is difficult to visualize right perinephric stranding is extending to the right iliac fossa there is no significant wall thickening in colon. Vascular/Lymphatic: Arterial calcifications are seen in the aorta and its major branches. Reproductive: There are calcifications in prostate. Other: There is no ascites or pneumoperitoneum. Small umbilical hernia containing fat is seen. The small ventral hernia containing short segment of a small bowel loop superior to the umbilicus. Musculoskeletal: Degenerative changes are noted in lumbar spine. There is minimal anterolisthesis at the L4-L5 level. There is spinal stenosis at L4-L5 level. There is encroachment of neural foramina at multiple levels  in lumbar spine. IMPRESSION: There is marked right hydronephrosis caused by 1.4 cm calculus at the right ureteropelvic junction. Multiple bilateral renal stones. There is marked right perinephric stranding which may be due to high-grade obstruction at the right ureteropelvic junction or superimposed pyelonephritis. There is no loculated perinephric fluid collection. There are patchy infiltrates in both lower lung fields, more so on the right side suggesting atelectasis/pneumonia. Small right pleural effusion. COPD with multiple blebs and bullae in the apices, more so on the right side. There is no evidence of intestinal obstruction or pneumoperitoneum. Aortic arteriosclerosis. Lumbar spondylosis. There is small ventral hernia containing short segment of small bowel loop in upper abdomen slightly superior to the umbilicus without evidence of obstruction or incarceration. Imaging findings of high-grade obstruction at the right ureteropelvic junction and possible acute pyelonephritis in right kidney were relayed to patient's provider Dr. Durwin Nora by telephone call. Electronically Signed   By: Ernie Avena M.D.   On: 10/01/2022 15:22   DG Chest Portable 1 View  Result Date: 10/01/2022 CLINICAL DATA:  Difficulty breathing EXAM: PORTABLE CHEST 1 VIEW COMPARISON:  Previous studies including the examination done earlier today FINDINGS: Transverse diameter of heart is increased. Central pulmonary vessels are prominent. Small bilateral pleural effusions are seen, more so on the right side. No significant interval changes are noted. IMPRESSION: Cardiomegaly. Central pulmonary vessels are prominent suggesting CHF. Small bilateral pleural effusions. Electronically Signed   By: Ernie Avena M.D.   On: 10/01/2022 14:56   DG Chest Portable 1 View  Result Date: 10/01/2022 CLINICAL DATA:  Shortness of breath EXAM: PORTABLE CHEST 1 VIEW COMPARISON:  09/09/2021 FINDINGS: Transverse diameter of heart is increased.  Central pulmonary vessels are prominent. There are no signs of alveolar pulmonary edema. There is no focal pulmonary consolidation. Small bilateral pleural effusions are seen. There is no pneumothorax. IMPRESSION: Cardiomegaly. Possibility of pericardial effusion is not excluded. Central pulmonary vessels are prominent suggesting mild CHF. There are no signs of alveolar pulmonary edema or focal pulmonary consolidation. Small bilateral pleural effusions. Electronically Signed   By: Ernie Avena M.D.   On: 10/01/2022 12:26     Discharge Instructions: Discharge Instructions     Call MD for:  difficulty breathing, headache or visual disturbances   Complete by: As directed    Call MD for:  extreme fatigue   Complete by: As directed    Call MD for:  persistant dizziness or  light-headedness   Complete by: As directed    Call MD for:  persistant nausea and vomiting   Complete by: As directed    Call MD for:  redness, tenderness, or signs of infection (pain, swelling, redness, odor or green/yellow discharge around incision site)   Complete by: As directed    Call MD for:  severe uncontrolled pain   Complete by: As directed    Call MD for:  temperature >100.4   Complete by: As directed    Discharge instructions   Complete by: As directed    Frank Morales, Frank Morales were admitted to the hospital because you had a severe infection of your kidneys after a 1/4 cm stone was found in your kidneys that caused your blood pressure to be low. Because of this, you were admitted to the critical care unit where they gave you medications to keep your blood pressure elevated and were intubated. You were also treated with antibiotics and a prodcedure by the kidney doctors, called Urologists. They placed a stent in your ureter to help you drain your urine, especially with the stone in your right kidney.   Now you are stable to discharged home. You will need a 5-day course of antibiotics called Augmentin - every 12  hours starting tonight.   You will need treatment of that stone in your right kidney. The urologists would like to see your in their office in the next couple of weeks to follow up with your symptoms after hospital discharge.    Make an appointment by calling them at: [phone]. Their offices are located at:  [address] [place], [place], [place] [address]  Unfortunately we cannot schedule an appointment prior to discharging you from the hospital. Please call [phone] to make an appointment at the Internal Medicine Center in the next 1-2 weeks. Please call them as well should you have any return of fevers, need more supplemental oxygen, or the abdominal pain or discomfort with urination returns.   Take care of yourself. Thank you for allowing [place] to care for you,  Morene Crocker, MD   Increase activity slowly   Complete by: As directed        Signed: Morene Crocker, MD [facility] - PGY1 Pager: [phone] 10/06/2022, 11:24 AM    Please contact the on call pager after 5 pm and on weekends at [phone].

## 2022-10-06 NOTE — Assessment & Plan Note (Signed)
Baseline 3.5 L supplemental O2

## 2022-10-07 DIAGNOSIS — I5032 Chronic diastolic (congestive) heart failure: Secondary | ICD-10-CM | POA: Diagnosis not present

## 2022-10-07 DIAGNOSIS — I509 Heart failure, unspecified: Secondary | ICD-10-CM | POA: Diagnosis not present

## 2022-10-09 ENCOUNTER — Telehealth: Payer: Self-pay

## 2022-10-09 NOTE — Telephone Encounter (Signed)
[facility] requesting VO for PT/OT and skilled nursing. Please call back @ [phone].

## 2022-10-09 NOTE — Telephone Encounter (Signed)
Return call to [place] with [facility]. Stated pt was recently discharged from the hospital; requesting verbal orders for "PT/OT, skilled nursing and medical social worker to aid w/transportation". VO given - sending to blue team for approval or denial. Thanks

## 2022-10-24 NOTE — Progress Notes (Signed)
Established Patient Office Visit  Subjective   Patient ID: Frank Morales, male    DOB: Jun 26, 1960  Age: 62 y.o. MRN: [id number]  Chief Complaint  Patient presents with   Hospitalization Follow-up    Septic shock Obstructing R UPJ nephrolithiasis Pyelonephritis     Frank Morales is a 62 year old male who presents for a hospital follow up for septic shock secondary to an obstructing 1.4 cm UPJ stone and right lower lung pneumonia.  He was admitted to the ICU and required mechanical ventilation along with vasopressors.  Urology placed a stent and he was started on a 10-day course of Augmentin therapy after cultures of the right renal pelvis grew Proteus Mirabilis.  Today, he reports dysuria, hematuria, urinary frequency.  He denied fever chills, nausea, and vomiting. He stated that overall he does not feel much better since hospitalization.  He did complete the 10-day course of Augmentin.  Patient reported that he has a procedure today at 76 with urology to remove the stone and possibly remove the stent.  He has yet to have a urology outpatient follow-up appointment per patient.   Patient reported that his breathing is back to baseline on 3.5 L of supplemental oxygen.    Past Medical History:  Diagnosis Date   Acute exacerbation of CHF (congestive heart failure) (HCC) 12/15/2019   Acute heart failure (HCC) 12/16/2019   Angio-edema    Atrial fibrillation and flutter (HCC) 08/17/2015   A. S/p failed DCCV // b. Severe BAE on echo >> rate control strategy (has seen AF clinic) // c. Xarelto for anticoag (CHADS2-VASc=2 / CHF, HTN)   Chronic diastolic CHF (congestive heart failure) (HCC) 08/30/2015   A. Echo 6/17: Apical HK, moderate focal basal and mild concentric LVH, EF 50-55, diffuse HK, trivial MR, severe BAE, mild TR, PASP 37   Dependence on continuous supplemental oxygen    3L   Hematuria 01/10/2021   Hepatic fibrosis 02/29/2016   F3/4 on elastography.    Hepatitis C,  chronic (HCC) 08/19/2015   Hepatitis C, chronic (HCC) 08/19/2015   Treated in 2017 with SVR   History of cardiac catheterization    a. LHC 6/17: LAD irregs, o/w no CAD   Hypertension    Hyposmia 12/22/2017   OSA (obstructive sleep apnea) 11/21/2015   No Cpap   Pleural effusion on right 03/14/2018   Sleep apnea    Tobacco abuse       Objective:     BP (!) 100/55 (BP Location: Right Arm, Patient Position: Sitting, Cuff Size: Normal)   Pulse 89   Temp 98.1 F (36.7 C) (Oral)   Wt (!) 301 lb 11.2 oz (136.9 kg)   SpO2 100% Comment: ON 3l  BMI 42.08 kg/m  BP Readings from Last 3 Encounters:  10/25/22 (!) 100/55  10/06/22 (!) 87/63  09/13/22 110/70      Physical Exam Constitutional:      Appearance: He is obese.  Cardiovascular:     Rate and Rhythm: Normal rate. Rhythm irregularly irregular.     Heart sounds: No murmur heard. Abdominal:     General: Abdomen is protuberant. Bowel sounds are normal.     Palpations: Abdomen is soft.     Tenderness: There is abdominal tenderness (right flank tenderness). There is no right CVA tenderness or left CVA tenderness.  Skin:    General: Skin is warm and dry.  Neurological:     Mental Status: He is alert and oriented to person,  place, and time.  Psychiatric:        Mood and Affect: Mood normal.      No results found for any visits on 10/25/22.  Last CBC Lab Results  Component Value Date   WBC 5.7 10/06/2022   HGB 9.0 (L) 10/06/2022   HCT 29.8 (L) 10/06/2022   MCV 82.1 10/06/2022   MCH 24.8 (L) 10/06/2022   RDW 22.6 (H) 10/06/2022   PLT 235 10/06/2022   Last metabolic panel Lab Results  Component Value Date   GLUCOSE 120 (H) 10/06/2022   NA 134 (L) 10/06/2022   K 3.3 (L) 10/06/2022   CL 94 (L) 10/06/2022   CO2 32 10/06/2022   BUN 8 10/06/2022   CREATININE 0.67 10/06/2022   GFRNONAA >60 10/06/2022   CALCIUM 8.2 (L) 10/06/2022   PHOS 3.0 10/06/2022   PROT 7.7 10/01/2022   ALBUMIN 2.2 (L) 10/06/2022   BILITOT  1.4 (H) 10/01/2022   ALKPHOS 47 10/01/2022   AST 58 (H) 10/01/2022   ALT 23 10/01/2022   ANIONGAP 8 10/06/2022      The 10-year ASCVD risk score (Arnett DK, et al., 2019) is: 15.1%    Assessment & Plan:   Problem List Items Addressed This Visit       Cardiovascular and Mediastinum   Chronic diastolic CHF (congestive heart failure) (HCC) (Chronic)    During hospitalization the patient was hypotensive and the diuretics were held upon discharge for 2 days.  The patient was instructed to begin the diuretics on July 23 or earlier if he became fluid overloaded.  Exam today his lungs are clear to auscultate bilaterally, heart is irregular irregular rhythm with a normal rate.  His weight during hospitalization was 308 and today his weight is 301.  He reports compliance with medications.  Patient has a cardiology appointment coming up next week. PLAN: -Continue Farxiga and Lasix -Continue cardiology follow-up        Respiratory   COPD (chronic obstructive pulmonary disease) (HCC) (Chronic)    Patient required mechanical ventilation during hospitalization for a acute on chronic respiratory failure.  He is currently back on the baseline 3.5 L of oxygen.  Patient is still smoking. PLAN: -Continue Flovent, albuterol, Anoro Ellipta -Patient declines smoking cessation therapies or resources today        Genitourinary   Obstructive uropathy - Primary    Patient was admitted to the hospital with sepsis secondary to a urinary source secondary to a lithiasis in the UPJ.  During his hospitalization urology placed a stent and cultures grew Proteus Mirabilis.  Although he still endorses symptoms of hematuria, dysuria, urinary frequency; he denies systemic symptoms such as fever chills nausea, and vomiting.  Reported finishing his course of Augmentin and that he has a procedure today with urology to remove the stone and possibly the stent. PLAN: -Patient reported stone removal today at 2:30 PM -Will  follow-up with him in 1 month and assess his symptoms of hematuria, frequency, dysuria then -Instructed to follow-up with urology outpatient after the procedure.      Relevant Orders   BMP8+Anion Gap     Other   Abnormal CT scan of lung    During hospitalization, a 3.2 cm lobular density in the lateral aspect of the right lower lung field was seen on a CT scan.  PCCM recommends 1 month follow-up to see if the density has resolved. PLAN: -Noncontrast CT of the chest ordered       Relevant Orders  CT Chest Wo Contrast   Constipation    Reported constipation since his hospitalization.  He stated that his bowel movements have been about every 3 to 4 days which is not normal for him and the stools are hard.  He has not tried senna docusate or MiraLAX yet.  Patient has a upcoming procedure with urology later this afternoon, since the patient will more than likely be receiving medications during the procedure or after the procedure such as opioids, will send stool softeners and laxatives to the pharmacy to help regulate bowel movements.Marland Kitchen PLAN -MiraLAX sent to the pharmacy -Senna docusate sent to the pharmacy       Return in about 4 weeks (around 11/22/2022) for Follow up CT results, stent/stone removal and symptoms .    Faith Rogue, DO

## 2022-10-25 ENCOUNTER — Ambulatory Visit (INDEPENDENT_AMBULATORY_CARE_PROVIDER_SITE_OTHER): Payer: Medicaid Other | Admitting: Student

## 2022-10-25 ENCOUNTER — Encounter: Payer: Self-pay | Admitting: Student

## 2022-10-25 VITALS — BP 100/55 | HR 89 | Temp 98.1°F | Wt 301.7 lb

## 2022-10-25 DIAGNOSIS — F17210 Nicotine dependence, cigarettes, uncomplicated: Secondary | ICD-10-CM

## 2022-10-25 DIAGNOSIS — R918 Other nonspecific abnormal finding of lung field: Secondary | ICD-10-CM

## 2022-10-25 DIAGNOSIS — J449 Chronic obstructive pulmonary disease, unspecified: Secondary | ICD-10-CM

## 2022-10-25 DIAGNOSIS — K5900 Constipation, unspecified: Secondary | ICD-10-CM | POA: Insufficient documentation

## 2022-10-25 DIAGNOSIS — N202 Calculus of kidney with calculus of ureter: Secondary | ICD-10-CM | POA: Diagnosis not present

## 2022-10-25 DIAGNOSIS — I5032 Chronic diastolic (congestive) heart failure: Secondary | ICD-10-CM

## 2022-10-25 DIAGNOSIS — K5909 Other constipation: Secondary | ICD-10-CM

## 2022-10-25 DIAGNOSIS — N139 Obstructive and reflux uropathy, unspecified: Secondary | ICD-10-CM | POA: Diagnosis not present

## 2022-10-25 DIAGNOSIS — R8271 Bacteriuria: Secondary | ICD-10-CM | POA: Diagnosis not present

## 2022-10-25 MED ORDER — POLYETHYLENE GLYCOL 3350 17 G PO PACK: 17.0000 g | PACK | Freq: Every day | ORAL | 0 refills | Status: DC

## 2022-10-25 MED ORDER — SENNOSIDES-DOCUSATE SODIUM 8.6-50 MG PO TABS: 1.0000 | ORAL_TABLET | Freq: Every day | ORAL | 0 refills | Status: DC

## 2022-10-25 NOTE — Assessment & Plan Note (Signed)
Patient required mechanical ventilation during hospitalization for a acute on chronic respiratory failure.  He is currently back on the baseline 3.5 L of oxygen.  Patient is still smoking. PLAN: -Continue Flovent, albuterol, Anoro Ellipta -Patient declines smoking cessation therapies or resources today

## 2022-10-25 NOTE — Assessment & Plan Note (Addendum)
During hospitalization the patient was hypotensive and the diuretics were held upon discharge for 2 days.  The patient was instructed to begin the diuretics on July 23 or earlier if he became fluid overloaded.  Exam today his lungs are clear to auscultate bilaterally, heart is irregular irregular rhythm with a normal rate.  His weight during hospitalization was 308 and today his weight is 301.  He reports compliance with medications.  Patient has a cardiology appointment coming up next week. PLAN: -Continue Farxiga and Lasix -Continue cardiology follow-up

## 2022-10-25 NOTE — Assessment & Plan Note (Signed)
During hospitalization, a 3.2 cm lobular density in the lateral aspect of the right lower lung field was seen on a CT scan.  PCCM recommends 1 month follow-up to see if the density has resolved. PLAN: -Noncontrast CT of the chest ordered

## 2022-10-25 NOTE — Assessment & Plan Note (Signed)
Patient was admitted to the hospital with sepsis secondary to a urinary source secondary to a lithiasis in the UPJ.  During his hospitalization urology placed a stent and cultures grew Proteus Mirabilis.  Although he still endorses symptoms of hematuria, dysuria, urinary frequency; he denies systemic symptoms such as fever chills nausea, and vomiting.  Reported finishing his course of Augmentin and that he has a procedure today with urology to remove the stone and possibly the stent. PLAN: -Patient reported stone removal today at 2:30 PM -Will follow-up with him in 1 month and assess his symptoms of hematuria, frequency, dysuria then -Instructed to follow-up with urology outpatient after the procedure.

## 2022-10-25 NOTE — Assessment & Plan Note (Signed)
Reported constipation since his hospitalization.  He stated that his bowel movements have been about every 3 to 4 days which is not normal for him and the stools are hard.  He has not tried senna docusate or MiraLAX yet.  Patient has a upcoming procedure with urology later this afternoon, since the patient will more than likely be receiving medications during the procedure or after the procedure such as opioids, will send stool softeners and laxatives to the pharmacy to help regulate bowel movements.Marland Kitchen PLAN -MiraLAX sent to the pharmacy -Senna docusate sent to the pharmacy

## 2022-10-28 NOTE — Progress Notes (Signed)
Attempted to call patient this morning, he did not answer home or mobile phone. Will try to call tomorrow.

## 2022-10-28 NOTE — Progress Notes (Unsigned)
Cardiology Clinic Note   Patient Name: Frank Morales Date of Encounter: 10/30/2022  Primary Care Provider:  Olegario Messier, MD Primary Cardiologist:  Frank Pates, MD  Patient Profile    62 year old male with history of diastolic CHF, persistent atrial fibrillation, despite  DCCV in 7/17 but reverted back to NSR.   hypertension, substance abuse, OSA on chronic oxygen, last seen by Dr. Dietrich Morales on 08/07/2021.    He was admitted to [facility] on 10/01/2022 through 10/06/2022 in the setting of septic shock, pyelonephritis, obstructing right UPJ nephrolithiasis, acute on chronic hypoxic respiratory failure, requiring mechanical ventilation.  He underwent stent placement by urology due to 1.4 cm UPJ stone.    Treated with broad-spectrum antibiotics.  He remained on Xarelto and diltiazem for atrial fibrillation.  Cardiology was not involved during recent admission.  Past Medical History    Past Medical History:  Diagnosis Date   Acute exacerbation of CHF (congestive heart failure) (HCC) 12/15/2019   Acute heart failure (HCC) 12/16/2019   Angio-edema    Atrial fibrillation and flutter (HCC) 08/17/2015   A. S/p failed DCCV // b. Severe BAE on echo >> rate control strategy (has seen AF clinic) // c. Xarelto for anticoag (CHADS2-VASc=2 / CHF, HTN)   Chronic diastolic CHF (congestive heart failure) (HCC) 08/30/2015   A. Echo 6/17: Apical HK, moderate focal basal and mild concentric LVH, EF 50-55, diffuse HK, trivial MR, severe BAE, mild TR, PASP 37   Dependence on continuous supplemental oxygen    3L   Hematuria 01/10/2021   Hepatic fibrosis 02/29/2016   F3/4 on elastography.    Hepatitis C, chronic (HCC) 08/19/2015   Hepatitis C, chronic (HCC) 08/19/2015   Treated in 2017 with SVR   History of cardiac catheterization    a. LHC 6/17: LAD irregs, o/w no CAD   Hypertension    Hyposmia 12/22/2017   OSA (obstructive sleep apnea) 11/21/2015   No Cpap   Pleural effusion on right  03/14/2018   Sleep apnea    Tobacco abuse    Past Surgical History:  Procedure Laterality Date   CARDIAC CATHETERIZATION N/A 08/21/2015   Procedure: Right/Left Heart Cath and Coronary Angiography;  Surgeon: Frank Lex, MD;  Location: [facility] INVASIVE CV LAB;  Service: Cardiovascular;  Laterality: N/A;   CARDIOVERSION N/A 09/22/2015   Procedure: CARDIOVERSION;  Surgeon: Frank Stade, MD;  Location: [facility] ENDOSCOPY;  Service: Cardiovascular;  Laterality: N/A;   COLONOSCOPY N/A 07/19/2016   Procedure: COLONOSCOPY;  Surgeon: Frank Rist, MD;  Location: [facility] ENDOSCOPY;  Service: Gastroenterology;  Laterality: N/A;   CYSTOSCOPY WITH URETEROSCOPY AND STENT PLACEMENT Right 10/01/2022   Procedure: CYSTOSCOPY WITH URETEROSCOPY AND STENT PLACEMENT;  Surgeon: Frank Elliot, MD;  Location: [facility] OR;  Service: Urology;  Laterality: Right;   ESOPHAGOGASTRODUODENOSCOPY N/A 07/19/2016   Procedure: ESOPHAGOGASTRODUODENOSCOPY (EGD);  Surgeon: Frank Rist, MD;  Location: [facility] ENDOSCOPY;  Service: Gastroenterology;  Laterality: N/A;   IR THORACENTESIS ASP PLEURAL SPACE W/IMG GUIDE  07/09/2019   NO PAST SURGERIES     SINUS ENDO WITH FUSION N/A 10/02/2018   Procedure: SINUS ENDO WITH FUSION;  Surgeon: Frank Obey, MD;  Location: [facility] OR;  Service: ENT;  Laterality: N/A;    Allergies  Allergies  Allergen Reactions   Lactose Intolerance (Gi) Nausea And Vomiting    History of Present Illness    [name] comes today posthospitalization follow-up after being admitted for sepsis, acute on chronic respiratory failure, right UPJ  stone status post stent, and pneumonia.  We are following him for permanent atrial fibrillation he remains on Xarelto and diltiazem.  CHA2DS2-VASc score 2.   He comes today without any cardiac complaints.  He denies chest pain, palpitations, bleeding on Xarelto.  His main complaint is flank pain, related to right kidney stone.  He continues to be on oxygen via nasal cannula for his COPD.   He is not very active currently.  He is medically compliant.  Home Medications    Current Outpatient Medications  Medication Sig Dispense Refill   albuterol (VENTOLIN HFA) 108 (90 Base) MCG/ACT inhaler Inhale 2 puffs into the lungs every 6 (six) hours as needed for wheezing or shortness of breath. 8 g 2   ANORO ELLIPTA 62.5-25 MCG/ACT AEPB INHALE 1 PUFF INTO THE LUNGS DAILY 60 each 2   azelastine (ASTELIN) 0.1 % nasal spray USE 2 SPRAYS IN EACH NOSTRIL TWICE DAILY 90 mL 1   dapagliflozin propanediol (FARXIGA) 10 MG TABS tablet TAKE 1 TABLET(10 MG) BY MOUTH DAILY BEFORE BREAKFAST 90 tablet 3   ferrous sulfate 325 (65 FE) MG tablet Take 1 tablet (325 mg total) by mouth every Monday, Wednesday, and Friday. 30 tablet 3   fluticasone (FLOVENT HFA) 110 MCG/ACT inhaler Inhale 1 puff into the lungs daily.     levocetirizine (XYZAL) 5 MG tablet Take 1 tablet (5 mg total) by mouth every evening. 30 tablet 4   polyethylene glycol (MIRALAX) 17 g packet Take 17 g by mouth daily. 14 each 0   senna-docusate (SENOKOT-S) 8.6-50 MG tablet Take 1 tablet by mouth daily. 30 tablet 0   diltiazem (CARDIZEM CD) 240 MG 24 hr capsule TAKE 2 CAPSULES(480 MG) BY MOUTH DAILY 180 capsule 3   furosemide (LASIX) 80 MG tablet Take 1 tablet (80 mg total) by mouth 2 (two) times daily. 180 tablet 3   metolazone (ZAROXOLYN) 2.5 MG tablet Take 1 tablet (2.5 mg total) by mouth once a week. Take every Wednesday. 30 tablet 1   potassium chloride SA (KLOR-CON M) 20 MEQ tablet Take 2 tablets (40 mEq total) by mouth 2 (two) times daily. 120 tablet 2   rivaroxaban (XARELTO) 20 MG TABS tablet Take 1 tablet (20 mg total) by mouth daily with supper. 90 tablet 2   No current facility-administered medications for this visit.     Family History    Family History  Problem Relation Age of Onset   Asthma Mother    Allergic rhinitis Mother    Hypertension Mother    Diabetes Mother    Pneumonia Father    Hypertension Maternal Grandfather     Colon cancer Neg Hx    He indicated that his mother is deceased. He indicated that his father is deceased. He indicated that his maternal grandmother is deceased. He indicated that his maternal grandfather is deceased. He indicated that his paternal grandmother is deceased. He indicated that his paternal grandfather is deceased. He indicated that the status of his neg hx is unknown.  Social History    Social History   Socioeconomic History   Marital status: Single    Spouse name: Not on file   Number of children: Not on file   Years of education: Not on file   Highest education level: Not on file  Occupational History   Occupation: unemployed    Comment: Previous cook   Tobacco Use   Smoking status: Every Day    Current packs/day: 0.50    Types: Cigarettes  Passive exposure: Current   Smokeless tobacco: Never   Tobacco comments:    5 per day  Vaping Use   Vaping status: Never Used  Substance and Sexual Activity   Alcohol use: Yes    Alcohol/week: 5.0 standard drinks of alcohol    Types: 5 Standard drinks or equivalent per week    Comment: 2 times a week beer   Drug use: No   Sexual activity: Not on file  Other Topics Concern   Not on file  Social History Narrative   ** Merged History Encounter **       Social Determinants of Health   Financial Resource Strain: Not on file  Food Insecurity: No Food Insecurity (04/16/2022)   Hunger Vital Sign    Worried About Running Out of Food in the Last Year: Never true    Ran Out of Food in the Last Year: Never true  Transportation Needs: Unmet Transportation Needs (04/16/2022)   PRAPARE - Administrator, Civil Service (Medical): Yes    Lack of Transportation (Non-Medical): Yes  Physical Activity: Not on file  Stress: Not on file  Social Connections: Socially Isolated (04/16/2022)   Social Connection and Isolation Panel [NHANES]    Frequency of Communication with Friends and Family: More than three times a week     Frequency of Social Gatherings with Friends and Family: Twice a week    Attends Religious Services: Never    Database administrator or Organizations: No    Attends Banker Meetings: Never    Marital Status: Never married  Intimate Partner Violence: Not At Risk (04/16/2022)   Humiliation, Afraid, Rape, and Kick questionnaire    Fear of Current or Ex-Partner: No    Emotionally Abused: No    Physically Abused: No    Sexually Abused: No     Review of Systems    General:  No chills, fever, night sweats or weight changes.  Cardiovascular:  No chest pain, dyspnea on exertion, edema, orthopnea, palpitations, paroxysmal nocturnal dyspnea. Dermatological: No rash, lesions/masses Respiratory: No cough, dyspnea Urologic: No hematuria, dysuria Abdominal:   No nausea, vomiting, diarrhea, bright red blood per rectum, melena, or hematemesis Neurologic:  No visual changes, wkns, changes in mental status. All other systems reviewed and are otherwise negative except as noted above.       Physical Exam    VS:  BP 124/86   Pulse 88   Ht 5\' 11"  (1.803 m)   Wt 289 lb 3.2 oz (131.2 kg)   SpO2 98%   BMI 40.34 kg/m  , BMI Body mass index is 40.34 kg/m.     GEN: Well nourished, well developed, in no acute distress.  Obese HEENT: normal. Neck: Supple, no JVD, carotid bruits, or masses. Cardiac: IRRR, no murmurs, rubs, or gallops. No clubbing, cyanosis, edema.  Radials/DP/PT 2+ and equal bilaterally.  Respiratory:  Respirations regular and unlabored, clear to auscultation bilaterally.  Wearing oxygen via nasal cannula. GI: Soft, nontender, distended, BS + x 4. MS: no deformity or atrophy. Skin: warm and dry, no rash. Neuro:  Strength and sensation are intact. Psych: Normal affect.      Lab Results  Component Value Date   WBC 5.7 10/06/2022   HGB 9.0 (L) 10/06/2022   HCT 29.8 (L) 10/06/2022   MCV 82.1 10/06/2022   PLT 235 10/06/2022   Lab Results  Component Value Date    CREATININE 0.66 (L) 10/25/2022   BUN 11 10/25/2022  NA 135 10/25/2022   K 4.5 10/25/2022   CL 99 10/25/2022   CO2 26 10/25/2022   Lab Results  Component Value Date   ALT 23 10/01/2022   AST 58 (H) 10/01/2022   ALKPHOS 47 10/01/2022   BILITOT 1.4 (H) 10/01/2022   Lab Results  Component Value Date   CHOL 176 08/07/2021   HDL 37 (L) 08/07/2021   LDLCALC 101 (H) 08/07/2021   TRIG 219 (H) 08/07/2021   CHOLHDL 4.8 08/07/2021    Lab Results  Component Value Date   HGBA1C 6.3 (H) 10/02/2022     Review of Prior Studies    Echocardiogram 08/08/2022 1. Left ventricular ejection fraction, by estimation, is 55 to 60%. The  left ventricle has normal function. The left ventricle has no regional  wall motion abnormalities. There is moderate concentric left ventricular  hypertrophy. Left ventricular  diastolic function could not be evaluated.   2. Right ventricular systolic function is normal. The right ventricular  size is normal. There is mildly elevated pulmonary artery systolic  pressure.   3. Left atrial size was moderately dilated.   4. Right atrial size was mildly dilated.   5. The mitral valve is normal in structure. Trivial mitral valve  regurgitation. No evidence of mitral stenosis.   6. The aortic valve is grossly normal. Aortic valve regurgitation is not  visualized. No aortic stenosis is present.   7. Aortic dilatation noted. There is mild dilatation of the ascending  aorta, measuring 43 mm.   8. The inferior vena cava is normal in size with greater than 50%  respiratory variability, suggesting right atrial pressure of 3 mmHg.   Comparison(s): No significant change from prior study.    Assessment & Plan   1.  Permanent Atrial fibrillation: Heart rate is well-controlled on diltiazem.  He continues on Xarelto without any issues of bleeding, hemoptysis, or melena.  Continue current medication regimen.  2.  Chronic diastolic CHF: No evidence of volume overload today.   He does have some abdominal distention which is likely related to O2 continuous via nasal cannula.  There is no lower extremity edema.  His weight is down approximately 11 pounds since being discharged.  He is maintaining a low-sodium diet.  Blood pressure is controlled.  Continue diuretic therapy as listed above.  3.  Hypertension: Good control of blood pressure on diltiazem only.  He is on diuretics for diastolic CHF as well.  4.  O2 dependent COPD: Remains on oxygen followed by pulmonary.  5.  Right nephrolithiasis: Status post stent placement.  To follow-up with nephrology as directed.  Due to see them next week.       Signed, Bettey Mare. Liborio Nixon, ANP, AACC   10/30/2022 8:58 AM      Office [phone] Fax [phone]  Notice: This dictation was prepared with Dragon dictation along with smaller phrase technology. Any transcriptional errors that result from this process are unintentional and may not be corrected upon review.

## 2022-10-29 ENCOUNTER — Encounter: Payer: Self-pay | Admitting: Student

## 2022-10-30 ENCOUNTER — Other Ambulatory Visit: Payer: Self-pay | Admitting: Urology

## 2022-10-30 ENCOUNTER — Ambulatory Visit: Payer: Medicaid Other | Attending: Nurse Practitioner | Admitting: Adult Health

## 2022-10-30 ENCOUNTER — Encounter: Payer: Self-pay | Admitting: Adult Health

## 2022-10-30 ENCOUNTER — Telehealth: Payer: Self-pay | Admitting: Internal Medicine

## 2022-10-30 VITALS — BP 124/86 | HR 88 | Ht 71.0 in | Wt 289.2 lb

## 2022-10-30 DIAGNOSIS — I4892 Unspecified atrial flutter: Secondary | ICD-10-CM

## 2022-10-30 DIAGNOSIS — N200 Calculus of kidney: Secondary | ICD-10-CM | POA: Diagnosis not present

## 2022-10-30 DIAGNOSIS — I5032 Chronic diastolic (congestive) heart failure: Secondary | ICD-10-CM

## 2022-10-30 DIAGNOSIS — I4891 Unspecified atrial fibrillation: Secondary | ICD-10-CM | POA: Diagnosis not present

## 2022-10-30 DIAGNOSIS — D649 Anemia, unspecified: Secondary | ICD-10-CM | POA: Diagnosis not present

## 2022-10-30 DIAGNOSIS — I10 Essential (primary) hypertension: Secondary | ICD-10-CM | POA: Diagnosis not present

## 2022-10-30 DIAGNOSIS — I4821 Permanent atrial fibrillation: Secondary | ICD-10-CM

## 2022-10-30 DIAGNOSIS — J449 Chronic obstructive pulmonary disease, unspecified: Secondary | ICD-10-CM

## 2022-10-30 DIAGNOSIS — N179 Acute kidney failure, unspecified: Secondary | ICD-10-CM

## 2022-10-30 DIAGNOSIS — E876 Hypokalemia: Secondary | ICD-10-CM

## 2022-10-30 MED ORDER — POTASSIUM CHLORIDE CRYS ER 20 MEQ PO TBCR: 40.0000 meq | EXTENDED_RELEASE_TABLET | Freq: Two times a day (BID) | ORAL | 2 refills | Status: DC

## 2022-10-30 MED ORDER — RIVAROXABAN 20 MG PO TABS
20.0000 mg | ORAL_TABLET | Freq: Every day | ORAL | 2 refills | Status: DC
Start: 2022-10-30 — End: 2024-01-27

## 2022-10-30 MED ORDER — DILTIAZEM HCL ER COATED BEADS 240 MG PO CP24: ORAL_CAPSULE | ORAL | 3 refills | Status: DC

## 2022-10-30 MED ORDER — METOLAZONE 2.5 MG PO TABS: 2.5000 mg | ORAL_TABLET | ORAL | 1 refills | Status: DC

## 2022-10-30 MED ORDER — FUROSEMIDE 80 MG PO TABS
80.0000 mg | ORAL_TABLET | Freq: Two times a day (BID) | ORAL | 3 refills | Status: DC
Start: 2022-10-30 — End: 2023-02-21

## 2022-10-30 NOTE — Telephone Encounter (Signed)
Patient with diagnosis of afib on Xarelto for anticoagulation.    Procedure: right ureteroscopy Date of procedure: 11/08/22   CHA2DS2-VASc Score = 2   This indicates a 2.2% annual risk of stroke. The patient's score is based upon: CHF History: 1 HTN History: 1 Diabetes History: 0 Stroke History: 0 Vascular Disease History: 0 Age Score: 0 Gender Score: 0      CrCl 162 ml/min  Per office protocol, patient can hold Xarelto for 2 days prior to procedure.    **This guidance is not considered finalized until pre-operative APP has relayed final recommendations.**

## 2022-10-30 NOTE — Progress Notes (Signed)
Internal Medicine Clinic Attending  I was physically present during the key portions of the resident provided service and participated in the medical decision making of patient's management care. I reviewed pertinent patient test results.  The assessment, diagnosis, and plan were formulated together and I agree with the documentation in the resident's note. Mr. Linhardt continuing to follow with urology for nephrolithiasis and ureteral stent management. Suspect these are contributing to his persistent urinary symptoms and have encouraged f/u as scheduled.  Dickie La, MD

## 2022-10-30 NOTE — Telephone Encounter (Signed)
   Pre-operative Risk Assessment    Patient Name: Frank Morales  DOB: Jul 19, 1960 MRN: [id number]      Request for Surgical Clearance    Procedure:   Right Ureteroscopy  Date of Surgery:  Clearance 11/08/22                                 Surgeon:  Dr. Alvester Morin Surgeon's Group or Practice Name:  [facility] Phone number:  [phone] Fax number:  [phone]   Type of Clearance Requested:   - Medical  - Pharmacy:  Hold would like to know if he needs to hold any medication      Type of Anesthesia:  General    Additional requests/questions:   Would like to have medical and pharmacy clearance.  Signed, Tawni Millers   10/30/2022, 1:52 PM

## 2022-10-30 NOTE — Telephone Encounter (Signed)
   Patient Name: Frank Morales  DOB: 09/23/1960 MRN: [id number]  Primary Cardiologist: Dietrich Pates, MD  Chart reviewed as part of pre-operative protocol coverage. Pre-op clearance already addressed by colleagues in earlier phone notes. To summarize recommendations:  Seen today in the office by Joni Reining, NP. Per her: He was okay from CV standpoint. Heart rate was controlled. No bleeding issues on Xarelto. He is okay to proceed from cardiac standpoint. Would recommend he see pulmonology for clearance as he has O2 dependent COPD.   Per office protocol, patient can hold Xarelto for 2 days prior to procedure.  Would restart Xarelto when medically safe to do so.    Will route this bundled recommendation to requesting provider via Epic fax function and remove from pre-op pool. Please call with questions.  Sharlene Dory, PA-C 10/30/2022, 4:19 PM

## 2022-10-30 NOTE — Patient Instructions (Signed)
Medication Instructions:  No Changes *If you need a refill on your cardiac medications before your next appointment, please call your pharmacy*   Lab Work: No Labs If you have labs (blood work) drawn today and your tests are completely normal, you will receive your results only by: MyChart Message (if you have MyChart) OR A paper copy in the mail If you have any lab test that is abnormal or we need to change your treatment, we will call you to review the results.   Testing/Procedures: No Testing   Follow-Up: At [facility], you and your health needs are our priority.  As part of our continuing mission to provide you with exceptional heart care, we have created designated Provider Care Teams.  These Care Teams include your primary Cardiologist (physician) and Advanced Practice Providers (APPs -  Physician Assistants and Nurse Practitioners) who all work together to provide you with the care you need, when you need it.  We recommend signing up for the patient portal called "MyChart".  Sign up information is provided on this After Visit Summary.  MyChart is used to connect with patients for Virtual Visits (Telemedicine).  Patients are able to view lab/test results, encounter notes, upcoming appointments, etc.  Non-urgent messages can be sent to your provider as well.   To learn more about what you can do with MyChart, go to [email or web address].    Your next appointment:   3 month(s)  Provider:   Dietrich Pates, MD

## 2022-11-01 ENCOUNTER — Encounter (HOSPITAL_COMMUNITY): Payer: Self-pay

## 2022-11-01 NOTE — Patient Instructions (Signed)
SURGICAL WAITING ROOM VISITATION  Patients having surgery or a procedure may have no more than 2 support people in the waiting area - these visitors may rotate.    Children under the age of 48 must have an adult with them who is not the patient.   If the patient needs to stay at the hospital during part of their recovery, the visitor guidelines for inpatient rooms apply. Pre-op nurse will coordinate an appropriate time for 1 support person to accompany patient in pre-op.  This support person may not rotate.    Please refer to the [facility] website for the visitor guidelines for Inpatients (after your surgery is over and you are in a regular room).       Your procedure is scheduled on: 11-08-22   Report to [facility] Main Entrance    Report to admitting at     1:00 PM   Call this number if you have problems the morning of surgery [phone]   Do not eat food or drink liquids:After Midnight.           If you have questions, please contact your surgeon's office.   FOLLOW ANY ADDITIONAL PRE OP INSTRUCTIONS YOU RECEIVED FROM YOUR SURGEON'S OFFICE!!!     Oral Hygiene is also important to reduce your risk of infection.                                    Remember - BRUSH YOUR TEETH THE MORNING OF SURGERY WITH YOUR REGULAR TOOTHPASTE  DENTURES WILL BE REMOVED PRIOR TO SURGERY PLEASE DO NOT APPLY "Poly grip" OR ADHESIVES!!!   Do NOT smoke after Midnight   Stop all vitamins and herbal supplements 7 days before surgery.   Take these medicines the morning of surgery with A SIP OF WATER: Diltiazem Okay to use inhalers and bring them with you Okay to use nasal spray Ondansetron and Phenazopyridine if needed  HOLD FARXIGA 3 DAYS PRIOR (do not take after 11-04-22)   Hold Xarelto 3 days prior to surgery (do not take after 11-05-22)                              You may not have any metal on your body including jewelry, and body piercing             Do not wear lotions,  powders, cologne, or deodorant              Men may shave face and neck.   Do not bring valuables to the hospital. [facility] IS NOT RESPONSIBLE   FOR VALUABLES.   Contacts, glasses, dentures or bridgework may not be worn into surgery.   Bring small overnight bag day of surgery.   DO NOT BRING YOUR HOME MEDICATIONS TO THE HOSPITAL. PHARMACY WILL DISPENSE MEDICATIONS LISTED ON YOUR MEDICATION LIST TO YOU DURING YOUR ADMISSION IN THE HOSPITAL!                Please read over the following fact sheets you were given: IF YOU HAVE QUESTIONS ABOUT YOUR PRE-OP INSTRUCTIONS PLEASE CALL [phone] Gwen   . If you test positive for Covid or have been in contact with anyone that has tested positive in the last 10 days please notify you surgeon.    [facility] - Preparing for Surgery Before surgery, you can play  an important role.  Because skin is not sterile, your skin needs to be as free of germs as possible.  You can reduce the number of germs on your skin by washing with CHG (chlorahexidine gluconate) soap before surgery.  CHG is an antiseptic cleaner which kills germs and bonds with the skin to continue killing germs even after washing. Please DO NOT use if you have an allergy to CHG or antibacterial soaps.  If your skin becomes reddened/irritated stop using the CHG and inform your nurse when you arrive at Short Stay. Do not shave (including legs and underarms) for at least 48 hours prior to the first CHG shower.  You may shave your face/neck. Please follow these instructions carefully:  1.  Shower with CHG Soap the night before surgery and the  morning of Surgery.  2.  If you choose to wash your hair, wash your hair first as usual with your  normal  shampoo.  3.  After you shampoo, rinse your hair and body thoroughly to remove the  shampoo.                           4.  Use CHG as you would any other liquid soap.  You can apply chg directly  to the skin and wash                       Gently  with a scrungie or clean washcloth.  5.  Apply the CHG Soap to your body ONLY FROM THE NECK DOWN.   Do not use on face/ open                           Wound or open sores. Avoid contact with eyes, ears mouth and genitals (private parts).                       Wash face,  Genitals (private parts) with your normal soap.             6.  Wash thoroughly, paying special attention to the area where your surgery  will be performed.  7.  Thoroughly rinse your body with warm water from the neck down.  8.  DO NOT shower/wash with your normal soap after using and rinsing off  the CHG Soap.                9.  Pat yourself dry with a clean towel.            10.  Wear clean pajamas.            11.  Place clean sheets on your bed the night of your first shower and do not  sleep with pets. Day of Surgery : Do not apply any lotions/deodorants the morning of surgery.  Please wear clean clothes to the hospital/surgery center.  FAILURE TO FOLLOW THESE INSTRUCTIONS MAY RESULT IN THE CANCELLATION OF YOUR SURGERY PATIENT SIGNATURE_________________________________  NURSE SIGNATURE__________________________________  ________________________________________________________________________

## 2022-11-01 NOTE — Progress Notes (Addendum)
PCP - Faith Rogue, DO lov 10-25-22 epic Cardiologist - Clearance 10-30-22 epic Jari Favre, [place]   Huston Foley, MD  PPM/ICD -  Device Orders -  Rep Notified -   Chest x-ray - 1 view 10-01-22 EKG - 10-30-22 epic Stress Test -  ECHO - 08-08-22  Cardiac Cath -   Sleep Study -  CPAP -   Fasting Blood Sugar -  Checks Blood Sugar _____ times a day  Blood Thinner Instructions:Xarelto hold 2 days Aspirin Instructions:  ERAS Protcol - PRE-SURGERY Ensure or G2-   Farxiga-  COVID vaccine -  Activity-- Anesthesia review: CHF, OSA COPD, O2 dependent Hep C 2017 treated, Afib, Cardiology recommends pulm. clearance  Patient denies shortness of breath, fever, cough and chest pain at PAT appointment   All instructions explained to the patient, with a verbal understanding of the material. Patient agrees to go over the instructions while at home for a better understanding. Patient also instructed to self quarantine after being tested for COVID-19. The opportunity to ask questions was provided.

## 2022-11-05 ENCOUNTER — Encounter (HOSPITAL_COMMUNITY): Payer: Self-pay

## 2022-11-05 NOTE — Progress Notes (Signed)
COVID Vaccine Completed:  Yes  Date of COVID positive in last 90 days:  PCP - Olegario Messier, MD Cardiologist - Dietrich Pates, MD  Cardiac clearance in Epic dated 10-30-22 by Jari Favre, PA-C  Chest x-ray - 10-01-22 Epic EKG - 10-30-22 Epic Stress Test -  ECHO - 08-08-22 Epic Cardiac Cath - 08-21-15 Epic Pacemaker/ICD device last checked: Spinal Cord Stimulator: Holter Monitor - 02-15-16 Epic  Bowel Prep -   Sleep Study - Yes, +sleep apnea CPAP -   Prediabetes Fasting Blood Sugar -  Checks Blood Sugar _____ times a day  Last dose of GLP1 agonist-  N/A GLP1 instructions:  N/A   Last dose of SGLT-2 inhibitors-  N/A SGLT-2 instructions: N/A   Blood Thinner Instructions:  Xarelto.  Hold x2 days per clearance Aspirin Instructions: Last Dose:  Activity level:  Can go up a flight of stairs and perform activities of daily living without stopping and without symptoms of chest pain or shortness of breath.  Able to exercise without symptoms  Unable to go up a flight of stairs without symptoms of     Anesthesia review:  Afib, CHF, aortic dilation, COPD, OSA, chronic Hep C  Oxygen -   Patient denies shortness of breath, fever, cough and chest pain at PAT appointment  Patient verbalized understanding of instructions that were given to them at the PAT appointment. Patient was also instructed that they will need to review over the PAT instructions again at home before surgery.

## 2022-11-06 ENCOUNTER — Encounter (HOSPITAL_COMMUNITY): Payer: Self-pay

## 2022-11-06 ENCOUNTER — Other Ambulatory Visit: Payer: Self-pay

## 2022-11-06 ENCOUNTER — Encounter (HOSPITAL_COMMUNITY)
Admission: RE | Admit: 2022-11-06 | Discharge: 2022-11-06 | Disposition: A | Discharge status: Home / Self Care | Payer: Medicaid Other | Source: Ambulatory Visit | Attending: Urology | Admitting: Urology

## 2022-11-06 VITALS — BP 107/71 | HR 74 | Temp 98.9°F | Resp 22 | Ht 71.0 in | Wt 290.0 lb

## 2022-11-06 DIAGNOSIS — I10 Essential (primary) hypertension: Secondary | ICD-10-CM | POA: Diagnosis not present

## 2022-11-06 DIAGNOSIS — Z01812 Encounter for preprocedural laboratory examination: Secondary | ICD-10-CM | POA: Insufficient documentation

## 2022-11-06 DIAGNOSIS — R7303 Prediabetes: Secondary | ICD-10-CM | POA: Insufficient documentation

## 2022-11-06 HISTORY — DX: Anemia, unspecified: D64.9

## 2022-11-06 HISTORY — DX: Dependence on supplemental oxygen: Z99.81

## 2022-11-06 HISTORY — DX: Chronic obstructive pulmonary disease, unspecified: J44.9

## 2022-11-06 HISTORY — DX: Cardiac arrhythmia, unspecified: I49.9

## 2022-11-06 HISTORY — DX: Personal history of urinary calculi: Z87.442

## 2022-11-06 HISTORY — DX: Unspecified osteoarthritis, unspecified site: M19.90

## 2022-11-06 HISTORY — DX: Prediabetes: R73.03

## 2022-11-06 HISTORY — DX: Dyspnea, unspecified: R06.00

## 2022-11-06 LAB — COMPREHENSIVE METABOLIC PANEL
ALT: 12 U/L (ref 0–44)
AST: 14 U/L — ABNORMAL LOW (ref 15–41)
Albumin: 3.1 g/dL — ABNORMAL LOW (ref 3.5–5.0)
Alkaline Phosphatase: 54 U/L (ref 38–126)
Anion gap: 8 (ref 5–15)
BUN: 14 mg/dL (ref 8–23)
CO2: 28 mmol/L (ref 22–32)
Calcium: 8.3 mg/dL — ABNORMAL LOW (ref 8.9–10.3)
Chloride: 96 mmol/L — ABNORMAL LOW (ref 98–111)
Creatinine, Ser: 0.82 mg/dL (ref 0.61–1.24)
GFR, Estimated: >60 mL/min (ref >60)
Glucose, Bld: 104 mg/dL — ABNORMAL HIGH (ref 70–99)
Potassium: 4.2 mmol/L (ref 3.5–5.1)
Sodium: 132 mmol/L — ABNORMAL LOW (ref 135–145)
Total Bilirubin: 0.3 mg/dL (ref 0.3–1.2)
Total Protein: 7.6 g/dL (ref 6.5–8.1)

## 2022-11-06 LAB — CBC
HCT: 31 % — ABNORMAL LOW (ref 39.0–52.0)
Hemoglobin: 9.6 g/dL — ABNORMAL LOW (ref 13.0–17.0)
MCH: 26.1 pg (ref 26.0–34.0)
MCHC: 31 g/dL (ref 30.0–36.0)
MCV: 84.2 fL (ref 80.0–100.0)
Platelets: 380 10*3/uL (ref 150–400)
RBC: 3.68 MIL/uL — ABNORMAL LOW (ref 4.22–5.81)
RDW: 23.1 % — ABNORMAL HIGH (ref 11.5–15.5)
WBC: 5.6 10*3/uL (ref 4.0–10.5)
nRBC: 0 % (ref 0.0–0.2)

## 2022-11-07 DIAGNOSIS — I509 Heart failure, unspecified: Secondary | ICD-10-CM | POA: Diagnosis not present

## 2022-11-07 DIAGNOSIS — I5032 Chronic diastolic (congestive) heart failure: Secondary | ICD-10-CM | POA: Diagnosis not present

## 2022-11-13 ENCOUNTER — Ambulatory Visit (INDEPENDENT_AMBULATORY_CARE_PROVIDER_SITE_OTHER): Payer: Medicaid Other | Admitting: Gastroenterology

## 2022-11-13 ENCOUNTER — Encounter: Payer: Self-pay | Admitting: Gastroenterology

## 2022-11-13 VITALS — BP 130/70 | HR 67 | Ht 71.0 in | Wt 300.0 lb

## 2022-11-13 DIAGNOSIS — D509 Iron deficiency anemia, unspecified: Secondary | ICD-10-CM | POA: Diagnosis not present

## 2022-11-13 DIAGNOSIS — Z7901 Long term (current) use of anticoagulants: Secondary | ICD-10-CM | POA: Diagnosis not present

## 2022-11-13 DIAGNOSIS — K5909 Other constipation: Secondary | ICD-10-CM

## 2022-11-13 MED ORDER — PEG 3350-KCL-NA BICARB-NACL 420 G PO SOLR: 4000.0000 mL | Freq: Once | ORAL | 0 refills | Status: AC

## 2022-11-13 NOTE — Progress Notes (Addendum)
[facility] Consult Note:  History: Frank Morales 11/13/2022  Referring provider: Olegario Messier, MD  Reason for consult/chief complaint: Melena (Pt states he is doing well today)   Subjective  HPI: 62 year old man last seen May 2018 for EGD and colonoscopy in [facility] outpatient endoscopy department.  Colonoscopy for rectal bleeding.  Internal hemorrhoids found, complete exam with redundant colon, good prep.  3 polyps from 820 mm removed, all tubular adenomas without HGD.  EGD done at the request of infectious disease because patient had been treated for hepatitis C was noted to have fibrosis on elastography with platelets low end of normal at 162K.  No varices or other stigmata of portal hypertension seen on that EGD. _________________ At this time, Frank Morales appears to been referred back to [place] for iron deficiency anemia.  Lab studies indicate this was discovered at least several months ago.  Iron studies at that time suggested iron deficiency and he was put on an iron tablet he continues to take.  It has turned his stool dark. Today, he complains of constipation and states that he has a BM once a week. He is currently taking a Stool softener  to help relief his constipation. He also reports experiencing blood in his stool a while back and denies experiencing it recently. He denies any abdominal pain, hematemesis or vomiting. He reports being anemic for sometime now and states that he is currently taking iron 325 mg supplements daily.   It was initially difficult to get a clear and consistent history on some issues, but it appears he's been on oxygen supplementation for at least a year, and then with greater frequency since his hospitalization last month for urosepsis from an obstructing ureteral stone as well as pneumonia, both of which had him on the ventilator for days.  He was scheduled for a ureteral stent replacement or removal soon, but says he got a message  that it was pushed back to September 9 to give him more time to recover from his recent illness.  Unfortunately, he is still smoking.   We reviewed his recent hospitalization episode due to a septic shock. We discussed his latest tests and colonoscopy.  Patient denies any diarrhea, nausea, blood in stool,  bloating, unintentional weight loss, reflux, dysphagia.   ROS:  Review of Systems  Constitutional:  Negative for appetite change and fever.  HENT:  Negative for trouble swallowing.   Respiratory:  Positive for shortness of breath. Negative for cough.   Cardiovascular:  Negative for chest pain.  Gastrointestinal:  Positive for constipation. Negative for abdominal distention, abdominal pain, anal bleeding, blood in stool, diarrhea, nausea, rectal pain and vomiting.  Genitourinary:  Negative for dysuria.  Musculoskeletal:  Negative for back pain.  Skin:  Negative for rash.  Neurological:  Negative for weakness.  All other systems reviewed and are negative.    Past Medical History: Past Medical History:  Diagnosis Date   Acute exacerbation of CHF (congestive heart failure) (HCC) 12/15/2019   Acute heart failure (HCC) 12/16/2019   Anemia    Angio-edema    Arthritis    Atrial fibrillation and flutter (HCC) 08/17/2015   A. S/p failed DCCV // b. Severe BAE on echo >> rate control strategy (has seen AF clinic) // c. Xarelto for anticoag (CHADS2-VASc=2 / CHF, HTN)   Chronic diastolic CHF (congestive heart failure) (HCC) 08/30/2015   A. Echo 6/17: Apical HK, moderate focal basal and mild concentric LVH, EF 50-55, diffuse HK, trivial MR,  severe BAE, mild TR, PASP 37   COPD (chronic obstructive pulmonary disease) (HCC)    Dependence on continuous supplemental oxygen    3L   Dyspnea    Dysrhythmia    Afib   Hematuria 01/10/2021   Hepatic fibrosis 02/29/2016   F3/4 on elastography.    Hepatitis C, chronic (HCC) 08/19/2015   Hepatitis C, chronic (HCC) 08/19/2015   Treated in 2017 with  SVR   History of cardiac catheterization    a. LHC 6/17: LAD irregs, o/w no CAD   History of kidney stones    Hypertension    Hyposmia 12/22/2017   OSA (obstructive sleep apnea) 11/21/2015   No Cpap   Oxygen dependent    Pleural effusion on right 03/14/2018   Pre-diabetes    Sleep apnea    Tobacco abuse      Past Surgical History: Past Surgical History:  Procedure Laterality Date   CARDIAC CATHETERIZATION N/A 08/21/2015   Procedure: Right/Left Heart Cath and Coronary Angiography;  Surgeon: Marykay Lex, MD;  Location: [facility] INVASIVE CV LAB;  Service: Cardiovascular;  Laterality: N/A;   CARDIOVERSION N/A 09/22/2015   Procedure: CARDIOVERSION;  Surgeon: Wendall Stade, MD;  Location: [facility] ENDOSCOPY;  Service: Cardiovascular;  Laterality: N/A;   COLONOSCOPY N/A 07/19/2016   Procedure: COLONOSCOPY;  Surgeon: Sherrilyn Rist, MD;  Location: [facility] ENDOSCOPY;  Service: Gastroenterology;  Laterality: N/A;   CYSTOSCOPY WITH URETEROSCOPY AND STENT PLACEMENT Right 10/01/2022   Procedure: CYSTOSCOPY WITH URETEROSCOPY AND STENT PLACEMENT;  Surgeon: Crista Elliot, MD;  Location: [facility] OR;  Service: Urology;  Laterality: Right;   ESOPHAGOGASTRODUODENOSCOPY N/A 07/19/2016   Procedure: ESOPHAGOGASTRODUODENOSCOPY (EGD);  Surgeon: Sherrilyn Rist, MD;  Location: [facility] ENDOSCOPY;  Service: Gastroenterology;  Laterality: N/A;   IR THORACENTESIS ASP PLEURAL SPACE W/IMG GUIDE  07/09/2019   SINUS ENDO WITH FUSION N/A 10/02/2018   Procedure: SINUS ENDO WITH FUSION;  Surgeon: Suzanna Obey, MD;  Location: 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    Occupation: unemployed  Tobacco Use   Smoking status: Every Day    Current packs/day: 0.50    Types: Cigarettes    Passive exposure: Current   Smokeless tobacco: Never   Tobacco comments:    5 per day  Vaping Use   Vaping status: Never Used  Substance and Sexual Activity   Alcohol use: Yes    Alcohol/week: 14.0 standard drinks of alcohol    Types: 14 Cans of beer per week    Comment: 6 pk in a week   Drug use: No   Sexual activity: Not on file  Other Topics Concern   Not on file  Social History Narrative   ** Merged History Encounter **       Social Determinants of Health   Financial Resource Strain: Not on  file  Food Insecurity: No Food Insecurity (04/16/2022)   Hunger Vital Sign    Worried About Running Out of Food in the Last Year: Never true    Ran Out of Food in the Last Year: Never true  Transportation Needs: Unmet Transportation Needs (04/16/2022)   PRAPARE - Administrator, Civil Service (Medical): Yes    Lack of Transportation (Non-Medical): Yes  Physical Activity: Not on file  Stress: Not on file  Social Connections: Socially Isolated (04/16/2022)   Social Connection and Isolation Panel [NHANES]    Frequency of Communication with Friends and Family: More than three times a week    Frequency of Social Gatherings with Friends and Family: Twice a week    Attends Religious Services: Never    Database administrator or Organizations: No    Attends Banker Meetings: Never    Marital Status: Never married    Allergies: Allergies  Allergen Reactions   Lactose Intolerance (Gi) Nausea And Vomiting    Outpatient Meds: Current Outpatient Medications  Medication Sig Dispense Refill   albuterol (VENTOLIN HFA) 108 (90 Base) MCG/ACT inhaler Inhale 2 puffs into the lungs every 6  (six) hours as needed for wheezing or shortness of breath. 8 g 2   ANORO ELLIPTA 62.5-25 MCG/ACT AEPB INHALE 1 PUFF INTO THE LUNGS DAILY 60 each 2   azelastine (ASTELIN) 0.1 % nasal spray USE 2 SPRAYS IN EACH NOSTRIL TWICE DAILY 90 mL 1   dapagliflozin propanediol (FARXIGA) 10 MG TABS tablet TAKE 1 TABLET(10 MG) BY MOUTH DAILY BEFORE BREAKFAST 90 tablet 3   diltiazem (CARDIZEM CD) 240 MG 24 hr capsule TAKE 2 CAPSULES(480 MG) BY MOUTH DAILY 180 capsule 3   ferrous sulfate 325 (65 FE) MG tablet Take 1 tablet (325 mg total) by mouth every Monday, Wednesday, and Friday. 30 tablet 3   fluticasone (FLOVENT HFA) 110 MCG/ACT inhaler Inhale 1 puff into the lungs daily.     furosemide (LASIX) 80 MG tablet Take 1 tablet (80 mg total) by mouth 2 (two) times daily. 180 tablet 3   levocetirizine (XYZAL) 5 MG tablet Take 1 tablet (5 mg total) by mouth every evening. 30 tablet 4   metolazone (ZAROXOLYN) 2.5 MG tablet Take 1 tablet (2.5 mg total) by mouth once a week. Take every Wednesday. 30 tablet 1   ondansetron (ZOFRAN-ODT) 4 MG disintegrating tablet Take 4 mg by mouth every 6 (six) hours as needed for nausea or vomiting.     phenazopyridine (PYRIDIUM) 100 MG tablet Take 100 mg by mouth 2 (two) times daily as needed for pain.     polyethylene glycol (MIRALAX) 17 g packet Take 17 g by mouth daily. 14 each 0   potassium chloride SA (KLOR-CON M) 20 MEQ tablet Take 2 tablets (40 mEq total) by mouth 2 (two) times daily. 120 tablet 2   rivaroxaban (XARELTO) 20 MG TABS tablet Take 1 tablet (20 mg total) by mouth daily with supper. 90 tablet 2   senna-docusate (SENOKOT-S) 8.6-50 MG tablet Take 1 tablet by mouth daily. 30 tablet 0   No current facility-administered medications for this visit.      ___________________________________________________________________ Objective   Exam:  Ht 5\' 11"  (1.803 m)   Wt 300 lb (136.1 kg)   BMI 41.84 kg/m  Wt Readings from Last 3 Encounters:  11/13/22 300 lb (136.1 kg)   11/06/22 290 lb (131.5 kg)  10/30/22 289 lb 3.2 oz (131.2 kg)   General: Chronically ill-appearing  man, short of breath just getting on and off the exam table. Eyes: sclera anicteric, no redness ENT: oral mucosa moist without lesions, no cervical or supraclavicular lymphadenopathy CV: RRR, no JVD, pitting edema bilaterally Resp: Globally decreased breath sounds bilaterally, dyspneic, able to speak full sentences.  No accessory muscle use. GI: soft, morbidly obese, ventral hernia, no tenderness, with active bowel sounds. No guarding or palpable organomegaly noted, though limited by body habitus Skin; warm and dry, no rash or jaundice noted Neuro: awake, alert and oriented x 3. Normal gross motor function and fluent speech   Labs:  Lab Results  Component Value Date   WBC 5.6 11/06/2022   HGB 9.6 (L) 11/06/2022   HCT 31.0 (L) 11/06/2022   MCV 84.2 11/06/2022   PLT 380 11/06/2022      Latest Ref Rng & Units 11/06/2022    9:17 AM 10/25/2022   11:06 AM 10/06/2022    7:25 AM  CMP  Glucose 70 - 99 mg/dL 536  88  644   BUN 8 - 23 mg/dL 14  11  8    Creatinine 0.61 - 1.24 mg/dL 0.34  7.42  5.95   Sodium 135 - 145 mmol/L 132  135  134   Potassium 3.5 - 5.1 mmol/L 4.2  4.5  3.3   Chloride 98 - 111 mmol/L 96  99  94   CO2 22 - 32 mmol/L 28  26  32   Calcium 8.9 - 10.3 mg/dL 8.3  8.8  8.2   Total Protein 6.5 - 8.1 g/dL 7.6     Total Bilirubin 0.3 - 1.2 mg/dL 0.3     Alkaline Phos 38 - 126 U/L 54     AST 15 - 41 U/L 14     ALT 0 - 44 U/L 12        Radiologic Studies: CT CHEST, ABDOMEN AND PELVIS WITHOUT CONTRAST 10-01-22   TECHNIQUE: Multidetector CT imaging of the chest, abdomen and pelvis was performed following the standard protocol without IV contrast.   RADIATION DOSE REDUCTION: This exam was performed according to the departmental dose-optimization program which includes automated exposure control, adjustment of the mA and/or kV according to patient size and/or use of  iterative reconstruction technique.   COMPARISON:  05/16/2021   FINDINGS: CT CHEST FINDINGS   Cardiovascular: Heart is enlarged in size. There are scattered coronary artery calcifications. There is no evidence of pericardial effusion.   Mediastinum/Nodes: There are few slightly enlarged lymph nodes in mediastinum.   Lungs/Pleura: There are patchy infiltrates in both lower lung fields, more so on the right side suggesting atelectasis/pneumonia. There is interval worsening of this finding. There is 3.2 cm lobular density in the lateral aspect of right lower lung field. Small right pleural effusion is seen. Rest of the lung fields show no focal infiltrates. There is no pneumothorax.There are multiple blebs and bullae in the upper lung fields, more so on the right side.   Musculoskeletal: No acute findings are seen.   CT ABDOMEN PELVIS FINDINGS   Hepatobiliary: No focal abnormalities are seen in liver. There is no dilation of bile ducts. Gallbladder is unremarkable.   Pancreas: No focal abnormalities are seen.   Spleen: Unremarkable.   Adrenals/Urinary Tract: Adrenals are unremarkable. There is interval appearance of marked right hydronephrosis. There is 1.4 cm calculus at the right ureteropelvic junction. There are multiple other bilateral renal stones, more so in the right kidney. Largest other renal stones measures 3.1 cm in maximum diameter  in the midportion of right kidney. There is significant right perinephric stranding. There are no loculated fluid collections. Urinary bladder is unremarkable.   Stomach/Bowel: Stomach is unremarkable. Small bowel loops are not dilated. Appendix is difficult to visualize right perinephric stranding is extending to the right iliac fossa there is no significant wall thickening in colon.   Vascular/Lymphatic: Arterial calcifications are seen in the aorta and its major branches.   Reproductive: There are calcifications in prostate.    Other: There is no ascites or pneumoperitoneum. Small umbilical hernia containing fat is seen. The small ventral hernia containing short segment of a small bowel loop superior to the umbilicus.   Musculoskeletal: Degenerative changes are noted in lumbar spine. There is minimal anterolisthesis at the L4-L5 level. There is spinal stenosis at L4-L5 level. There is encroachment of neural foramina at multiple levels in lumbar spine.   IMPRESSION: There is marked right hydronephrosis caused by 1.4 cm calculus at the right ureteropelvic junction. Multiple bilateral renal stones. There is marked right perinephric stranding which may be due to high-grade obstruction at the right ureteropelvic junction or superimposed pyelonephritis. There is no loculated perinephric fluid collection.   There are patchy infiltrates in both lower lung fields, more so on the right side suggesting atelectasis/pneumonia. Small right pleural effusion. COPD with multiple blebs and bullae in the apices, more so on the right side.   There is no evidence of intestinal obstruction or pneumoperitoneum. Aortic arteriosclerosis. Lumbar spondylosis.   There is small ventral hernia containing short segment of small bowel loop in upper abdomen slightly superior to the umbilicus without evidence of obstruction or incarceration.   Imaging findings of high-grade obstruction at the right ureteropelvic junction and possible acute pyelonephritis in right kidney were relayed to patient's provider Dr. Durwin Nora by telephone call.  Assessment: Chronic constipation  Iron deficiency anemia, unspecified iron deficiency anemia type  Long term (current) use of anticoagulants   62 year old man with multiple complex medical issues.  His health has significantly worsened since I saw him for his last endoscopic procedures, most notably COPD, volume overload and need for supplemental oxygen.  He has iron deficiency anemia, though it is  not yet clear to what extent that may be related to GI blood loss.  There is probably a significant element of chronic disease anemia.  Iron levels had not been rechecked since May, at which time B12 was normal but no folic acid level checked.  He saw medical resident clinic about 2 weeks ago and his blood counts were rechecked, but no iron, B12, folic acid or apparent repeat chest x-ray to evaluate his pneumonia.  He is in need of upper endoscopy and colonoscopy to evaluate this anemia, and in particular evaluate for any identifiable source of GI blood loss.  He is also overdue for colon polyp surveillance, having had multiple advanced adenomas as noted above. However, he needs more time to recover from this most recent severe illness, and to get through his urologic procedure.  His recent pneumonia and his chronic anemia needs some further follow-up with primary care as well before we can perform endoscopic procedures. Plan: -EGD and Colonoscopy tentatively scheduled for 01/09/2023 (next available hospital endoscopy date).  Hopefully he will have some medical improvement since then.  I will message his clinic medical resident requesting that they get a follow-up chest x-ray on him and continue to follow his CBC but also get repeat iron levels, B12 and folic acid level within the next few weeks.  If he needs intravenous iron, that should be arranged as well.  We want this patient to be in as good a condition as he can be for what will certainly be increased risk endoscopic procedures.  Procedures described along with risks and benefits in detail and he was agreeable.  The benefits and risks of the planned procedure were described in detail with the patient or (when appropriate) their health care proxy.  Risks were outlined as including, but not limited to, bleeding, infection, perforation, adverse medication reaction leading to cardiac or pulmonary decompensation, pancreatitis (if ERCP).  The limitation  of incomplete mucosal visualization was also discussed.  No guarantees or warranties were given.  He will need to hold OAC 2 days prior to his procedures.  We will communicate with his cardiologist about this, and I expect they will be agreeable since chart notes seem to indicate they are allowing it for his upcoming urologic procedure.  -Miralax daily is also recommended to help relieve his constipation.  Thank you for the courtesy of this consult.  Please call me with any questions or concerns.    Amada Jupiter, MD    [facility]   45 minutes were spent on this encounter (including chart review, history/exam, counseling/coordination of care, and documentation) > 50% of that time was spent on counseling and coordination of care.  I,Safa M Kadhim,acting as a scribe for Charlie Pitter III, MD.,have documented all relevant documentation on the behalf of Sherrilyn Rist, MD,as directed by  Sherrilyn Rist, MD while in the presence of Sherrilyn Rist, MD.   Marvis Repress III, MD, have reviewed all documentation for this visit. The documentation on 11/13/22 for the exam, diagnosis, procedures, and orders are all accurate and complete.    CC: Referring provider noted above

## 2022-11-13 NOTE — Patient Instructions (Addendum)
_______________________________________________________  If your blood pressure at your visit was 140/90 or greater, please contact your primary care physician to follow up on this.  _______________________________________________________  If you are age 61 or older, your body mass index should be between 23-30. Your Body mass index is 41.84 kg/m. If this is out of the aforementioned range listed, please consider follow up with your Primary Care Provider.  If you are age 2 or younger, your body mass index should be between 19-25. Your Body mass index is 41.84 kg/m. If this is out of the aformentioned range listed, please consider follow up with your Primary Care Provider.   ________________________________________________________  The [facility] providers would like to encourage you to use [facility] to communicate with providers for non-urgent requests or questions.  Due to long hold times on the telephone, sending your provider a message by [facility] may be a faster and more efficient way to get a response.  Please allow 48 business hours for a response.  Please remember that this is for non-urgent requests.  _______________________________________________________  Bonita Quin have been scheduled for an endoscopy and colonoscopy. Please follow the written instructions given to you at your visit today.  Please pick up your prep supplies at the pharmacy within the next 1-3 days.  If you use inhalers (even only as needed), please bring them with you on the day of your procedure.  DO NOT TAKE 7 DAYS PRIOR TO TEST- Trulicity (dulaglutide) Ozempic, Wegovy (semaglutide) Mounjaro (tirzepatide) Bydureon Bcise (exanatide extended release)  DO NOT TAKE 1 DAY PRIOR TO YOUR TEST Rybelsus (semaglutide) Adlyxin (lixisenatide) Victoza (liraglutide) Byetta (exanatide)  Due to recent changes in healthcare laws, you may see the results of your imaging and laboratory studies on MyChart before your provider has  had a chance to review them.  We understand that in some cases there may be results that are confusing or concerning to you. Not all laboratory results come back in the same time frame and the provider may be waiting for multiple results in order to interpret others.  Please give [place] 48 hours in order for your provider to thoroughly review all the results before contacting the office for clarification of your results.   It was a pleasure to see you today!  Thank you for trusting me with your gastrointestinal care!    ___________________________________________________________________________

## 2022-11-13 NOTE — Progress Notes (Deleted)
[facility] Consult Note:  History: NICOLA FEHLMAN 11/13/2022  Referring provider: Olegario Messier, MD  Reason for consult/chief complaint: No chief complaint on file.   Subjective  HPI: 62 year old man last seen May 2018 for EGD and colonoscopy in [facility] outpatient endoscopy department.  Colonoscopy for rectal bleeding.  Internal hemorrhoids found, complete exam with redundant colon, good prep.  3 polyps from 820 mm removed, all tubular adenomas without HGD.  EGD done at the request of infectious disease because patient had been treated for hepatitis C was noted to have fibrosis on elastography with platelets low end of normal at 162K.  No varices or other stigmata of portal hypertension seen on that EGD.  ***  (Late for today's appointment) ROS:  Review of Systems   Past Medical History: Past Medical History:  Diagnosis Date   Acute exacerbation of CHF (congestive heart failure) (HCC) 12/15/2019   Acute heart failure (HCC) 12/16/2019   Anemia    Angio-edema    Arthritis    Atrial fibrillation and flutter (HCC) 08/17/2015   A. S/p failed DCCV // b. Severe BAE on echo >> rate control strategy (has seen AF clinic) // c. Xarelto for anticoag (CHADS2-VASc=2 / CHF, HTN)   Chronic diastolic CHF (congestive heart failure) (HCC) 08/30/2015   A. Echo 6/17: Apical HK, moderate focal basal and mild concentric LVH, EF 50-55, diffuse HK, trivial MR, severe BAE, mild TR, PASP 37   COPD (chronic obstructive pulmonary disease) (HCC)    Dependence on continuous supplemental oxygen    3L   Dyspnea    Dysrhythmia    Afib   Hematuria 01/10/2021   Hepatic fibrosis 02/29/2016   F3/4 on elastography.    Hepatitis C, chronic (HCC) 08/19/2015   Hepatitis C, chronic (HCC) 08/19/2015   Treated in 2017 with SVR   History of cardiac catheterization    a. LHC 6/17: LAD irregs, o/w no CAD   History of kidney stones    Hypertension    Hyposmia 12/22/2017   OSA  (obstructive sleep apnea) 11/21/2015   No Cpap   Oxygen dependent    Pleural effusion on right 03/14/2018   Pre-diabetes    Sleep apnea    Tobacco abuse      Past Surgical History: Past Surgical History:  Procedure Laterality Date   CARDIAC CATHETERIZATION N/A 08/21/2015   Procedure: Right/Left Heart Cath and Coronary Angiography;  Surgeon: Marykay Lex, MD;  Location: [facility] INVASIVE CV LAB;  Service: Cardiovascular;  Laterality: N/A;   CARDIOVERSION N/A 09/22/2015   Procedure: CARDIOVERSION;  Surgeon: Wendall Stade, MD;  Location: [facility] ENDOSCOPY;  Service: Cardiovascular;  Laterality: N/A;   COLONOSCOPY N/A 07/19/2016   Procedure: COLONOSCOPY;  Surgeon: Sherrilyn Rist, MD;  Location: [facility] ENDOSCOPY;  Service: Gastroenterology;  Laterality: N/A;   CYSTOSCOPY WITH URETEROSCOPY AND STENT PLACEMENT Right 10/01/2022   Procedure: CYSTOSCOPY WITH URETEROSCOPY AND STENT PLACEMENT;  Surgeon: Crista Elliot, MD;  Location: [facility] OR;  Service: Urology;  Laterality: Right;   ESOPHAGOGASTRODUODENOSCOPY N/A 07/19/2016   Procedure: ESOPHAGOGASTRODUODENOSCOPY (EGD);  Surgeon: Sherrilyn Rist, MD;  Location: [facility] ENDOSCOPY;  Service: Gastroenterology;  Laterality: N/A;   IR THORACENTESIS ASP PLEURAL SPACE W/IMG GUIDE  07/09/2019   SINUS ENDO WITH FUSION N/A 10/02/2018   Procedure: SINUS ENDO WITH FUSION;  Surgeon: Suzanna Obey, MD;  Location: [facility] OR;  Service: ENT;  Laterality: N/A;     Family History: Family History  Problem Relation Age of  Onset   Asthma Mother    Allergic rhinitis Mother    Hypertension Mother    Diabetes Mother    Pneumonia Father    Hypertension Maternal Grandfather    Colon cancer Neg Hx     Social History: Social History   Socioeconomic History   Marital status: Single    Spouse name: Not on file   Number of children: Not on file   Years of education: Not on file   Highest education level: Not on file  Occupational History   Occupation: unemployed     Comment: Previous cook   Tobacco Use   Smoking status: Every Day    Current packs/day: 0.50    Types: Cigarettes    Passive exposure: Current   Smokeless tobacco: Never   Tobacco comments:    5 per day  Vaping Use   Vaping status: Never Used  Substance and Sexual Activity   Alcohol use: Yes    Alcohol/week: 14.0 standard drinks of alcohol    Types: 14 Cans of beer per week   Drug use: No   Sexual activity: Not on file  Other Topics Concern   Not on file  Social History Narrative   ** Merged History Encounter **       Social Determinants of Health   Financial Resource Strain: Not on file  Food Insecurity: No Food Insecurity (04/16/2022)   Hunger Vital Sign    Worried About Running Out of Food in the Last Year: Never true    Ran Out of Food in the Last Year: Never true  Transportation Needs: Unmet Transportation Needs (04/16/2022)   PRAPARE - Administrator, Civil Service (Medical): Yes    Lack of Transportation (Non-Medical): Yes  Physical Activity: Not on file  Stress: Not on file  Social Connections: Socially Isolated (04/16/2022)   Social Connection and Isolation Panel [NHANES]    Frequency of Communication with Friends and Family: More than three times a week    Frequency of Social Gatherings with Friends and Family: Twice a week    Attends Religious Services: Never    Database administrator or Organizations: No    Attends Banker Meetings: Never    Marital Status: Never married    Allergies: Allergies  Allergen Reactions   Lactose Intolerance (Gi) Nausea And Vomiting    Outpatient Meds: Current Outpatient Medications  Medication Sig Dispense Refill   albuterol (VENTOLIN HFA) 108 (90 Base) MCG/ACT inhaler Inhale 2 puffs into the lungs every 6 (six) hours as needed for wheezing or shortness of breath. 8 g 2   ANORO ELLIPTA 62.5-25 MCG/ACT AEPB INHALE 1 PUFF INTO THE LUNGS DAILY (Patient not taking: Reported on 10/31/2022) 60 each 2    azelastine (ASTELIN) 0.1 % nasal spray USE 2 SPRAYS IN EACH NOSTRIL TWICE DAILY 90 mL 1   dapagliflozin propanediol (FARXIGA) 10 MG TABS tablet TAKE 1 TABLET(10 MG) BY MOUTH DAILY BEFORE BREAKFAST 90 tablet 3   diltiazem (CARDIZEM CD) 240 MG 24 hr capsule TAKE 2 CAPSULES(480 MG) BY MOUTH DAILY 180 capsule 3   ferrous sulfate 325 (65 FE) MG tablet Take 1 tablet (325 mg total) by mouth every Monday, Wednesday, and Friday. (Patient not taking: Reported on 10/31/2022) 30 tablet 3   fluticasone (FLOVENT HFA) 110 MCG/ACT inhaler Inhale 1 puff into the lungs daily.     furosemide (LASIX) 80 MG tablet Take 1 tablet (80 mg total) by mouth 2 (two) times daily. 180  tablet 3   levocetirizine (XYZAL) 5 MG tablet Take 1 tablet (5 mg total) by mouth every evening. 30 tablet 4   metolazone (ZAROXOLYN) 2.5 MG tablet Take 1 tablet (2.5 mg total) by mouth once a week. Take every Wednesday. 30 tablet 1   ondansetron (ZOFRAN-ODT) 4 MG disintegrating tablet Take 4 mg by mouth every 6 (six) hours as needed for nausea or vomiting.     phenazopyridine (PYRIDIUM) 100 MG tablet Take 100 mg by mouth 2 (two) times daily as needed for pain.     polyethylene glycol (MIRALAX) 17 g packet Take 17 g by mouth daily. 14 each 0   potassium chloride SA (KLOR-CON M) 20 MEQ tablet Take 2 tablets (40 mEq total) by mouth 2 (two) times daily. 120 tablet 2   rivaroxaban (XARELTO) 20 MG TABS tablet Take 1 tablet (20 mg total) by mouth daily with supper. 90 tablet 2   senna-docusate (SENOKOT-S) 8.6-50 MG tablet Take 1 tablet by mouth daily. 30 tablet 0   No current facility-administered medications for this visit.      ___________________________________________________________________ Objective   Exam:  There were no vitals taken for this visit. Wt Readings from Last 3 Encounters:  11/06/22 290 lb (131.5 kg)  10/30/22 289 lb 3.2 oz (131.2 kg)  10/25/22 (!) 301 lb 11.2 oz (136.9 kg)    General: ***  Eyes: sclera anicteric, no  redness ENT: oral mucosa moist without lesions, no cervical or supraclavicular lymphadenopathy CV: ***, no JVD, no peripheral edema Resp: clear to auscultation bilaterally, normal RR and effort noted GI: soft, *** tenderness, with active bowel sounds. No guarding or palpable organomegaly noted. Skin; warm and dry, no rash or jaundice noted Neuro: awake, alert and oriented x 3. Normal gross motor function and fluent speech  Labs:  ***  Radiologic Studies:  ***  Assessment: No diagnosis found.  ***  Plan:  ***  Thank you for the courtesy of this consult.  Please call me with any questions or concerns.  Charlie Pitter III  CC: Referring provider noted above

## 2022-11-19 NOTE — Progress Notes (Addendum)
Called and LVMM for pt on 11/19/22 in regards to new date and time for surgery.  Asked for call back. PT called back on 11/19/22 and pt aware to arrive at 0730am on 11/25/22 for 0930 surgery.  PT still has copy of preop instructons and is aware to follow.  Pt voiced understanding

## 2022-11-20 ENCOUNTER — Ambulatory Visit (HOSPITAL_COMMUNITY): Payer: Medicaid Other

## 2022-11-21 ENCOUNTER — Ambulatory Visit (INDEPENDENT_AMBULATORY_CARE_PROVIDER_SITE_OTHER): Payer: Medicaid Other | Admitting: Student

## 2022-11-21 DIAGNOSIS — R918 Other nonspecific abnormal finding of lung field: Secondary | ICD-10-CM

## 2022-11-21 DIAGNOSIS — N139 Obstructive and reflux uropathy, unspecified: Secondary | ICD-10-CM | POA: Diagnosis not present

## 2022-11-21 NOTE — Assessment & Plan Note (Signed)
Patient presents via TeleMed for follow-up from his visit on October 25, 2022.  He states that his symptoms are much improved, with little to no dysuria and hematuria.  He underwent removal of stone and stent placement by urology on August 9, and has follow-up with them on September 9.  He denies any systemic symptoms of fevers, chills, worsening short of breath.  Plan: - Patient overall improving, will follow-up with urology on 11/25/2022

## 2022-11-21 NOTE — Assessment & Plan Note (Signed)
Currently scheduled for September 17 to further visualize 3.2 cm lobular density in the lateral aspect of the right lower field.  Will reach out to patient with results when available.  Currently denies any worsening dyspnea, and he is on his home oxygen of 3.5 L

## 2022-11-21 NOTE — Progress Notes (Signed)
Internal Medicine Clinic Attending  Case discussed with the resident at the time of the visit.  We reviewed the resident's history and exam and pertinent patient test results.  I agree with the assessment, diagnosis, and plan of care documented in the resident's note.  

## 2022-11-21 NOTE — Progress Notes (Signed)
   CC: Follow-up  This is a telephone encounter between [employer] and Frank Morales on 11/21/2022 for follow-up. The visit was conducted with the patient located at home and [place] Frank Morales at [facility]. The patient's identity was confirmed using their DOB and current address. The patient has consented to being evaluated through a telephone encounter and understands the associated risks (an examination cannot be done and the patient may need to come in for an appointment) / benefits (allows the patient to remain at home, decreasing exposure to coronavirus). I personally spent 8 minutes on medical discussion.   HPI:  [name] is a 62 y.o. with PMH as below.   Please see A&P for assessment of the patient's acute and chronic medical conditions.   Past Medical History:  Diagnosis Date   Acute exacerbation of CHF (congestive heart failure) (HCC) 12/15/2019   Acute heart failure (HCC) 12/16/2019   Anemia    Angio-edema    Arthritis    Atrial fibrillation and flutter (HCC) 08/17/2015   A. S/p failed DCCV // b. Severe BAE on echo >> rate control strategy (has seen AF clinic) // c. Xarelto for anticoag (CHADS2-VASc=2 / CHF, HTN)   Chronic diastolic CHF (congestive heart failure) (HCC) 08/30/2015   A. Echo 6/17: Apical HK, moderate focal basal and mild concentric LVH, EF 50-55, diffuse HK, trivial MR, severe BAE, mild TR, PASP 37   COPD (chronic obstructive pulmonary disease) (HCC)    Dependence on continuous supplemental oxygen    3L   Dyspnea    Dysrhythmia    Afib   Hematuria 01/10/2021   Hepatic fibrosis 02/29/2016   F3/4 on elastography.    Hepatitis C, chronic (HCC) 08/19/2015   Hepatitis C, chronic (HCC) 08/19/2015   Treated in 2017 with SVR   History of cardiac catheterization    a. LHC 6/17: LAD irregs, o/w no CAD   History of kidney stones    Hypertension    Hyposmia 12/22/2017   OSA (obstructive sleep apnea) 11/21/2015   No Cpap   Oxygen dependent    Pleural  effusion on right 03/14/2018   Pre-diabetes    Sleep apnea    Tobacco abuse    Review of Systems: Negative except stated in the HPI    Assessment & Plan:   Obstructive uropathy Patient presents via TeleMed for follow-up from his visit on October 25, 2022.  He states that his symptoms are much improved, with little to no dysuria and hematuria.  He underwent removal of stone and stent placement by urology on August 9, and has follow-up with them on September 9.  He denies any systemic symptoms of fevers, chills, worsening short of breath.  Plan: - Patient overall improving, will follow-up with urology on 11/25/2022  Abnormal CT scan of lung Currently scheduled for September 17 to further visualize 3.2 cm lobular density in the lateral aspect of the right lower field.  Will reach out to patient with results when available.  Currently denies any worsening dyspnea, and he is on his home oxygen of 3.5 L   Patient discussed with Dr. Lafonda Mosses  I personally spent 8 minutes with this patient on the phone.  Olegario Messier Internal Medicine Resident

## 2022-11-23 NOTE — Anesthesia Preprocedure Evaluation (Signed)
Anesthesia Evaluation  Patient identified by MRN, date of birth, ID band Patient awake    Reviewed: Allergy & Precautions, NPO status , Patient's Chart, lab work & pertinent test results  History of Anesthesia Complications Negative for: history of anesthetic complications  Airway Mallampati: III  TM Distance: >3 FB Neck ROM: Full    Dental  (+) Dental Advisory Given, Poor Dentition   Pulmonary shortness of breath and with exertion, sleep apnea (3L o2 otc) and Oxygen sleep apnea , COPD (3LPM [place]),  oxygen dependent, Current Smoker and Patient abstained from smoking.    + decreased breath sounds      Cardiovascular hypertension (137/91 preop), Pt. on medications +CHF  + dysrhythmias (xarelto LD 9/5) Atrial Fibrillation  Rhythm:Irregular Rate:Normal  Study Conclusions   - Left ventricle: The cavity size was normal. Wall thickness was   increased in a pattern of mild LVH. Systolic function was normal.   The estimated ejection fraction was in the range of 55% to 60%.   Wall motion was normal; there were no regional wall motion   abnormalities. - Ventricular septum: Septal motion showed abnormal function and   dyssynergy. The contour showed diastolic flattening. - Mitral valve: Calcified annulus. - Left atrium: The atrium was mildly dilated. - Right ventricle: The cavity size was mildly dilated. Wall   thickness was normal. Systolic function was moderately reduced. - Right atrium: The atrium was severely dilated. - Tricuspid valve: There was mild regurgitation.     Neuro/Psych negative neurological ROS  negative psych ROS   GI/Hepatic negative GI ROS,,,(+)       alcohol use, Hepatitis -, C  Endo/Other  diabetes (prediabetic)  Morbid obesityBMI 42  Renal/GU negative Renal ROS  negative genitourinary   Musculoskeletal  (+) Arthritis ,    Abdominal  (+) + obese  Peds  Hematology  (+) Blood dyscrasia, anemia On  Xarelto for Afib  Lab Results      Component                Value               Date                      WBC                      5.6                 11/06/2022                HGB                      9.6 (L)             11/06/2022                HCT                      31.0 (L)            11/06/2022                MCV                      84.2                11/06/2022  PLT                      380                 11/06/2022              Anesthesia Other Findings   Reproductive/Obstetrics negative OB ROS                              Anesthesia Physical Anesthesia Plan  ASA: 4  Anesthesia Plan: General   Post-op Pain Management:    Induction: Intravenous  PONV Risk Score and Plan: 1 and Ondansetron and Treatment may vary due to age or medical condition  Airway Management Planned: LMA  Additional Equipment: None  Intra-op Plan:   Post-operative Plan: Extubation in OR  Informed Consent: I have reviewed the patients History and Physical, chart, labs and discussed the procedure including the risks, benefits and alternatives for the proposed anesthesia with the patient or authorized representative who has indicated his/her understanding and acceptance.     Dental advisory given  Plan Discussed with: CRNA  Anesthesia Plan Comments: (Last airway note: Induction Type: IV induction and Rapid sequence Laryngoscope Size: Glidescope and 4 Grade View: Grade I Tube size: 8.0 mm Number of attempts: 1 )         Anesthesia Quick Evaluation

## 2022-11-25 ENCOUNTER — Ambulatory Visit (HOSPITAL_COMMUNITY)
Admission: RE | Admit: 2022-11-25 | Discharge: 2022-11-25 | Disposition: A | Discharge status: Home / Self Care | Payer: Medicaid Other | Attending: Urology | Admitting: Urology

## 2022-11-25 ENCOUNTER — Ambulatory Visit (HOSPITAL_COMMUNITY): Payer: Medicaid Other

## 2022-11-25 ENCOUNTER — Encounter (HOSPITAL_COMMUNITY)
Admission: RE | Disposition: A | Discharge status: Home / Self Care | Payer: Self-pay | Source: Home / Self Care | Attending: Urology

## 2022-11-25 ENCOUNTER — Ambulatory Visit (HOSPITAL_COMMUNITY): Payer: Medicaid Other | Admitting: Physician Assistant

## 2022-11-25 ENCOUNTER — Encounter (HOSPITAL_COMMUNITY): Payer: Self-pay | Admitting: Urology

## 2022-11-25 ENCOUNTER — Ambulatory Visit (HOSPITAL_BASED_OUTPATIENT_CLINIC_OR_DEPARTMENT_OTHER): Payer: Medicaid Other | Admitting: Anesthesiology

## 2022-11-25 ENCOUNTER — Other Ambulatory Visit: Payer: Self-pay

## 2022-11-25 DIAGNOSIS — I5032 Chronic diastolic (congestive) heart failure: Secondary | ICD-10-CM | POA: Diagnosis not present

## 2022-11-25 DIAGNOSIS — I509 Heart failure, unspecified: Secondary | ICD-10-CM | POA: Diagnosis not present

## 2022-11-25 DIAGNOSIS — F17210 Nicotine dependence, cigarettes, uncomplicated: Secondary | ICD-10-CM | POA: Insufficient documentation

## 2022-11-25 DIAGNOSIS — Z7902 Long term (current) use of antithrombotics/antiplatelets: Secondary | ICD-10-CM | POA: Diagnosis not present

## 2022-11-25 DIAGNOSIS — Z6841 Body Mass Index (BMI) 40.0 and over, adult: Secondary | ICD-10-CM | POA: Diagnosis not present

## 2022-11-25 DIAGNOSIS — J449 Chronic obstructive pulmonary disease, unspecified: Secondary | ICD-10-CM

## 2022-11-25 DIAGNOSIS — N201 Calculus of ureter: Secondary | ICD-10-CM | POA: Diagnosis not present

## 2022-11-25 DIAGNOSIS — I110 Hypertensive heart disease with heart failure: Secondary | ICD-10-CM

## 2022-11-25 DIAGNOSIS — D759 Disease of blood and blood-forming organs, unspecified: Secondary | ICD-10-CM | POA: Insufficient documentation

## 2022-11-25 DIAGNOSIS — R7303 Prediabetes: Secondary | ICD-10-CM

## 2022-11-25 DIAGNOSIS — N200 Calculus of kidney: Secondary | ICD-10-CM | POA: Insufficient documentation

## 2022-11-25 DIAGNOSIS — J9611 Chronic respiratory failure with hypoxia: Secondary | ICD-10-CM | POA: Diagnosis not present

## 2022-11-25 DIAGNOSIS — I4891 Unspecified atrial fibrillation: Secondary | ICD-10-CM | POA: Insufficient documentation

## 2022-11-25 DIAGNOSIS — G4730 Sleep apnea, unspecified: Secondary | ICD-10-CM | POA: Diagnosis not present

## 2022-11-25 DIAGNOSIS — Z9981 Dependence on supplemental oxygen: Secondary | ICD-10-CM | POA: Diagnosis not present

## 2022-11-25 HISTORY — PX: CYSTOSCOPY/URETEROSCOPY/HOLMIUM LASER/STENT PLACEMENT: SHX6546

## 2022-11-25 SURGERY — CYSTOSCOPY/URETEROSCOPY/HOLMIUM LASER/STENT PLACEMENT
Anesthesia: General | Laterality: Right

## 2022-11-25 MED ORDER — ORAL CARE MOUTH RINSE: 15.0000 mL | Freq: Once | OROMUCOSAL | Status: AC

## 2022-11-25 MED ORDER — FENTANYL CITRATE (PF) 100 MCG/2ML IJ SOLN
INTRAMUSCULAR | Status: AC
  Filled 2022-11-25: qty 2

## 2022-11-25 MED ORDER — HYDROCODONE-ACETAMINOPHEN 5-325 MG PO TABS: 1.0000 | ORAL_TABLET | ORAL | 0 refills | Status: DC | PRN

## 2022-11-25 MED ORDER — DEXAMETHASONE SODIUM PHOSPHATE 10 MG/ML IJ SOLN
INTRAMUSCULAR | Status: DC | PRN
Start: 2022-11-25 — End: 2022-11-25
  Administered 2022-11-25: 10 mg via INTRAVENOUS

## 2022-11-25 MED ORDER — LIDOCAINE HCL (CARDIAC) PF 100 MG/5ML IV SOSY
PREFILLED_SYRINGE | INTRAVENOUS | Status: DC | PRN
Start: 2022-11-25 — End: 2022-11-25
  Administered 2022-11-25: 100 mg via INTRATRACHEAL

## 2022-11-25 MED ORDER — OXYCODONE HCL 5 MG/5ML PO SOLN: 5.0000 mg | Freq: Once | ORAL | Status: DC | PRN

## 2022-11-25 MED ORDER — DEXAMETHASONE SODIUM PHOSPHATE 10 MG/ML IJ SOLN
INTRAMUSCULAR | Status: AC
  Filled 2022-11-25: qty 1

## 2022-11-25 MED ORDER — LIDOCAINE HCL (PF) 2 % IJ SOLN
INTRAMUSCULAR | Status: AC
  Filled 2022-11-25: qty 5

## 2022-11-25 MED ORDER — LACTATED RINGERS IV SOLN: INTRAVENOUS | Status: DC

## 2022-11-25 MED ORDER — ROCURONIUM BROMIDE 10 MG/ML (PF) SYRINGE
PREFILLED_SYRINGE | INTRAVENOUS | Status: AC
  Filled 2022-11-25: qty 10

## 2022-11-25 MED ORDER — 0.9 % SODIUM CHLORIDE (POUR BTL) OPTIME
TOPICAL | Status: DC | PRN
Start: 2022-11-25 — End: 2022-11-25
  Administered 2022-11-25: 1000 mL

## 2022-11-25 MED ORDER — FENTANYL CITRATE (PF) 100 MCG/2ML IJ SOLN
INTRAMUSCULAR | Status: DC | PRN
  Administered 2022-11-25 (×2): 50 ug via INTRAVENOUS

## 2022-11-25 MED ORDER — AMOXICILLIN-POT CLAVULANATE 875-125 MG PO TABS
1.0000 | ORAL_TABLET | Freq: Two times a day (BID) | ORAL | 0 refills | Status: DC
Start: 2022-11-25 — End: 2023-02-06

## 2022-11-25 MED ORDER — PROPOFOL 10 MG/ML IV BOLUS
INTRAVENOUS | Status: AC
  Filled 2022-11-25: qty 20

## 2022-11-25 MED ORDER — IOHEXOL 300 MG/ML  SOLN
INTRAMUSCULAR | Status: DC | PRN
  Administered 2022-11-25: 18 mL

## 2022-11-25 MED ORDER — ONDANSETRON HCL 4 MG/2ML IJ SOLN: 4.0000 mg | Freq: Once | INTRAMUSCULAR | Status: DC | PRN

## 2022-11-25 MED ORDER — CHLORHEXIDINE GLUCONATE 0.12 % MT SOLN
15.0000 mL | Freq: Once | OROMUCOSAL | Status: AC
  Administered 2022-11-25: 15 mL via OROMUCOSAL

## 2022-11-25 MED ORDER — CEFAZOLIN SODIUM-DEXTROSE 2-4 GM/100ML-% IV SOLN
2.0000 g | INTRAVENOUS | Status: AC
  Administered 2022-11-25: 2 g via INTRAVENOUS
  Filled 2022-11-25: qty 100

## 2022-11-25 MED ORDER — ACETAMINOPHEN 10 MG/ML IV SOLN: 1000.0000 mg | Freq: Once | INTRAVENOUS | Status: DC | PRN

## 2022-11-25 MED ORDER — MIDAZOLAM HCL 2 MG/2ML IJ SOLN
INTRAMUSCULAR | Status: AC
  Filled 2022-11-25: qty 2

## 2022-11-25 MED ORDER — DROPERIDOL 2.5 MG/ML IJ SOLN: 0.6250 mg | Freq: Once | INTRAMUSCULAR | Status: DC | PRN

## 2022-11-25 MED ORDER — HYDROMORPHONE HCL 1 MG/ML IJ SOLN: 0.2500 mg | INTRAMUSCULAR | Status: DC | PRN

## 2022-11-25 MED ORDER — OXYCODONE HCL 5 MG PO TABS: 5.0000 mg | ORAL_TABLET | Freq: Once | ORAL | Status: DC | PRN

## 2022-11-25 MED ORDER — SODIUM CHLORIDE 0.9 % IR SOLN
Status: DC | PRN
  Administered 2022-11-25: 3000 mL via INTRAVESICAL

## 2022-11-25 MED ORDER — PROPOFOL 10 MG/ML IV BOLUS
INTRAVENOUS | Status: DC | PRN
Start: 2022-11-25 — End: 2022-11-25
  Administered 2022-11-25: 200 mg via INTRAVENOUS

## 2022-11-25 MED ORDER — ONDANSETRON HCL 4 MG/2ML IJ SOLN
INTRAMUSCULAR | Status: AC
  Filled 2022-11-25: qty 2

## 2022-11-25 MED ORDER — MIDAZOLAM HCL 2 MG/2ML IJ SOLN
INTRAMUSCULAR | Status: DC | PRN
Start: 2022-11-25 — End: 2022-11-25
  Administered 2022-11-25: 2 mg via INTRAVENOUS

## 2022-11-25 MED ORDER — ONDANSETRON HCL 4 MG/2ML IJ SOLN
INTRAMUSCULAR | Status: DC | PRN
  Administered 2022-11-25: 4 mg via INTRAVENOUS

## 2022-11-25 SURGICAL SUPPLY — 25 items
BAG URO CATCHER STRL LF (MISCELLANEOUS) ×1 IMPLANT
BASKET LASER NITINOL 1.9FR (BASKET) IMPLANT
BASKET ZERO TIP NITINOL 2.4FR (BASKET) IMPLANT
BSKT STON RTRVL 120 1.9FR (BASKET)
BSKT STON RTRVL ZERO TP 2.4FR (BASKET)
CATH URETERAL DUAL LUMEN 10F (MISCELLANEOUS) IMPLANT
CATH URETL OPEN END 6FR 70 (CATHETERS) ×1 IMPLANT
CLOTH BEACON ORANGE TIMEOUT ST (SAFETY) ×1 IMPLANT
EXTRACTOR STONE 1.7FRX115CM (UROLOGICAL SUPPLIES) IMPLANT
GLOVE BIO SURGEON STRL SZ7.5 (GLOVE) ×1 IMPLANT
GOWN STRL REUS W/ TWL XL LVL3 (GOWN DISPOSABLE) ×1 IMPLANT
GOWN STRL REUS W/TWL XL LVL3 (GOWN DISPOSABLE) ×1
GUIDEWIRE ANG ZIPWIRE 038X150 (WIRE) IMPLANT
GUIDEWIRE STR DUAL SENSOR (WIRE) ×1 IMPLANT
KIT TURNOVER KIT A (KITS) IMPLANT
LASER FIB FLEXIVA PULSE ID 365 (Laser) IMPLANT
MANIFOLD NEPTUNE II (INSTRUMENTS) ×1 IMPLANT
PACK CYSTO (CUSTOM PROCEDURE TRAY) ×1 IMPLANT
SHEATH NAVIGATOR HD 11/13X28 (SHEATH) IMPLANT
SHEATH NAVIGATOR HD 11/13X36 (SHEATH) IMPLANT
STENT URET 6FRX26 CONTOUR (STENTS) IMPLANT
TRACTIP FLEXIVA PULS ID 200XHI (Laser) IMPLANT
TRACTIP FLEXIVA PULSE ID 200 (Laser) ×1
TUBING CONNECTING 10 (TUBING) ×1 IMPLANT
TUBING UROLOGY SET (TUBING) ×1 IMPLANT

## 2022-11-25 NOTE — Op Note (Signed)
Operative Note  Preoperative diagnosis:  1.  Right renal calculi  Postoperative diagnosis: 1.  Right renal calculi  Procedure(s): 1.  Cystoscopy with right retrograde pyelogram, right ureteroscopy with laser lithotripsy and ureteral stent exchange  Surgeon: Modena Slater, MD  Assistants: None  Anesthesia: General  Complications: None immediate  EBL: Minimal  Specimens: 1.  None   Drains/Catheters: 1.  6 x 26 double-J ureteral stent  Intraoperative findings: 1 segment of right renal pelvic stone, 1 cm upper pole calculus and 1 cm lower pole calculus.  All stones were soft and dusted to tiny fragments.  Retrograde pyelogram revealed some fullness in the kidney but no filling defect after treatment of the stone.  Indication: 62 year old male status post urgent right ureteral stent placement for a UPJ calculus and sepsis.  He presents for definitive management of his stones.  Description of procedure:  The patient was identified and consent was obtained.  The patient was taken to the operating room and placed in the supine position.  The patient was placed under general anesthesia.  Perioperative antibiotics were administered.  The patient was placed in dorsal lithotomy.  Patient was prepped and draped in a standard sterile fashion and a timeout was performed.  A 21 French rigid cystoscopy was advanced into the urethra and into the bladder.  Complete cystoscopy was performed.  There were no tumors or masses or stones.  I grasped the stent and pulled just beyond the urethral meatus.  The stent was calcified and I could not pass a wire through it so pulled out the stent.  I readvanced the scope into the bladder and advanced a sensor wire up the right ureter and into the kidney under fluoroscopic guidance followed by advancement of a second wire alongside this up to the kidney.  Scope was withdrawn.  1 wires was secured to the drape as a safety wire and the other wire was used to advance an  11 x 13 ureteral access sheath over the wire under continuous fluoroscopic guidance up to the proximal ureter.  Inner sheath and wire were withdrawn.  Digital ureteroscopy was performed and the stones were laser fragmented all dust settings to tiny fragments.  All the stones were soft and fragmented relatively easily.  All stones were treated.  I shot a retrograde pyelogram through the scope with findings noted above.  I withdrew the scope along with the access sheath visualizing the ureter upon removal.  There were no ureteral calculi and no ureteral injury was seen.  I backloaded the wire onto rigid cystoscope and advanced that into the bladder followed by routine placement of a 6 x 26 double-J ureteral stent.  Fluoroscopy confirmed proximal placement and direct visualization confirmed a good coil within the bladder.  I drained the bladder and withdrew the scope.  Patient tolerated the procedure well was stable postoperatively.  Plan: Follow-up in 1 week for stent removal

## 2022-11-25 NOTE — Anesthesia Postprocedure Evaluation (Signed)
Anesthesia Post Note  Patient: Frank Morales  Procedure(s) Performed: CYSTOSCOPY RIGHT RETROGRADE PYELOGRAM RIGHT URETEROSCOPY/HOLMIUM LASER/STENT EXCHANGE (Right)     Patient location during evaluation: PACU Anesthesia Type: General Level of consciousness: awake and alert, oriented and patient cooperative Pain management: pain level controlled Vital Signs Assessment: post-procedure vital signs reviewed and stable Respiratory status: spontaneous breathing, nonlabored ventilation and respiratory function stable Cardiovascular status: blood pressure returned to baseline and stable Postop Assessment: no apparent nausea or vomiting Anesthetic complications: no   No notable events documented.  Last Vitals:  Vitals:   11/25/22 1130 11/25/22 1144  BP: (!) 143/94 (!) 144/90  Pulse: 74 77  Resp: 20 17  Temp: (!) 36.4 C 36.4 C  SpO2: 95% 97%    Last Pain:  Vitals:   11/25/22 1130  TempSrc:   PainSc: 0-No pain                 Lannie Fields

## 2022-11-25 NOTE — Transfer of Care (Signed)
Immediate Anesthesia Transfer of Care Note  Patient: Frank Morales  Procedure(s) Performed: CYSTOSCOPY RIGHT RETROGRADE PYELOGRAM RIGHT URETEROSCOPY/HOLMIUM LASER/STENT EXCHANGE (Right)  Patient Location: PACU  Anesthesia Type:General  Level of Consciousness: awake, oriented, drowsy, and patient cooperative  Airway & Oxygen Therapy: Patient Spontanous Breathing and Patient connected to face mask oxygen  Post-op Assessment: Report given to RN and Post -op Vital signs reviewed and stable  Post vital signs: stable  Last Vitals:  Vitals Value Taken Time  BP 150/103 11/25/22 1049  Temp    Pulse 72 11/25/22 1050  Resp 16 11/25/22 1050  SpO2 93 % 11/25/22 1050  Vitals shown include unfiled device data.  Last Pain:  Vitals:   11/25/22 0825  TempSrc:   PainSc: 0-No pain         Complications: No notable events documented.

## 2022-11-25 NOTE — H&P (Signed)
CC/HPI: cc: Obstructing stone   HPI: Frank Morales is a 62 year old man whose medical history includes CHF, atrial fibrillation, COPD, sleep apnea, and chronic hypoxic respiratory failure who is oxygen dependent, he also has a history of obstructive uropathy, pyelonephritis and kidney stones. He was hospitalized in late July for 5 days due to to septic shock secondary to obstructing 1.4 cm right UPJ stone and Proteus Mirabella's positive urine cultures as well as right lower lobe pneumonia. Urology was consulted and placed a right-sided ureteral stent. The patient presents today to discuss next steps.   He currently denies fevers and chills. He is having pain and discomfort.     ALLERGIES: No Allergies    MEDICATIONS: Albuterol Sulfate  Anoro Ellipta  Azelastine Hcl  Diltiazem 24Hr Er (Cd) 240 mg capsule, ext release 24 hr  Farxiga 10 mg tablet  Fluticasone Propionate  Furosemide 80 mg tablet  Furosemide 80 mg tablet  Loratadine 10 mg tablet  Metolazone 2.5 mg tablet  Pantoprazole Sodium 20 mg tablet, delayed release  Potassium Chloride 20 meq tablet, ext release, particles/crystals  Simethicone 80 mg tablet,chewable  Xarelto 20 mg tablet     GU PSH: Cystoscopy - 05/28/2021 Locm 300-399Mg /Ml Iodine,1Ml - 05/16/2021     NON-GU PSH: No Non-GU PSH    GU PMH: Gross hematuria - 11/26/2021, - 05/28/2021, - 05/16/2021, - 2023 Personal Hx Urinary Tract Infections - 11/26/2021, - 2023 Renal calculus - 11/26/2021, - 05/28/2021    NON-GU PMH: Pyuria/other UA findings - 2023 Arthritis Asthma GERD Sleep Apnea    FAMILY HISTORY: No Family History    SOCIAL HISTORY: Marital Status: Single Current Smoking Status: Patient smokes occasionally. Has smoked since 04/18/2001. Smokes less than 1/2 pack per day.   Tobacco Use Assessment Completed: Used Tobacco in last 30 days? Social Drinker.  Drinks 1 caffeinated drink per day.    REVIEW OF SYSTEMS:    GU Review Male:   Patient reports burning/ pain  with urination and get up at night to urinate. Patient denies frequent urination, hard to postpone urination, leakage of urine, stream starts and stops, trouble starting your stream, have to strain to urinate , erection problems, and penile pain.  Gastrointestinal (Upper):   Patient denies nausea, vomiting, and indigestion/ heartburn.  Gastrointestinal (Lower):   Patient denies diarrhea and constipation.  Constitutional:   Patient denies fever, night sweats, weight loss, and fatigue.  Skin:   Patient denies skin rash/ lesion and itching.  Eyes:   Patient denies blurred vision and double vision.  Musculoskeletal:   Patient reports back pain. Patient denies joint pain.  Neurological:   Patient denies headaches and dizziness.  Psychologic:   Patient denies depression and anxiety.   Notes: Patient stated he still having severe flank pain on the left side and he wasn't given any pain medication.    VITAL SIGNS:      10/25/2022 12:34 PM  BP 105/66 mmHg  Pulse 102 /min  Temperature 97.7 F / 36.5 C   MULTI-SYSTEM PHYSICAL EXAMINATION:    Constitutional: Well-nourished. No physical deformities. Normally developed. Good grooming.  Respiratory: No labored breathing, no use of accessory muscles.   Cardiovascular: Normal temperature, normal extremity pulses, no swelling, no varicosities.  Skin: No paleness, no jaundice, no cyanosis. No lesion, no ulcer, no rash.  Neurologic / Psychiatric: Oriented to time, oriented to place, oriented to person. No depression, no anxiety, no agitation.  Gastrointestinal: No mass, no tenderness, no rigidity, non obese abdomen.     Complexity  of Data:  Source Of History:  Patient  Records Review:   Previous Hospital Records, Previous Patient Records  Urine Test Review:   Urinalysis  X-Ray Review: C.T. Abdomen/Pelvis: Reviewed Films. Reviewed Report.     10/25/22  Urinalysis  Urine Appearance Clear   Urine Color Yellow   Urine Glucose 2+ mg/dL  Urine Bilirubin  Neg mg/dL  Urine Ketones Neg mg/dL  Urine Specific Gravity 1.010   Urine Blood 2+ ery/uL  Urine pH 6.5   Urine Protein Neg mg/dL  Urine Urobilinogen 0.2 mg/dL  Urine Nitrites Neg   Urine Leukocyte Esterase 3+ leu/uL  Urine WBC/hpf 20 - 40/hpf   Urine RBC/hpf 10 - 20/hpf   Urine Epithelial Cells NS (Not Seen)   Urine Bacteria Mod (26-50/hpf)   Urine Mucous Present   Urine Yeast NS (Not Seen)   Urine Trichomonas Not Present   Urine Cystals Amorph Urates   Urine Casts NS (Not Seen)   Urine Sperm Not Present   Urine C&S  Culture, Urine -    PROCEDURES:          Urinalysis w/Scope Dipstick Dipstick Cont'd Micro  Color: Yellow Bilirubin: Neg mg/dL WBC/hpf: 20 - 29/FAO  Appearance: Clear Ketones: Neg mg/dL RBC/hpf: 10 - 13/YQM  Specific Gravity: 1.010 Blood: 2+ ery/uL Bacteria: Mod (26-50/hpf)  pH: 6.5 Protein: Neg mg/dL Cystals: Amorph Urates  Glucose: 2+ mg/dL Urobilinogen: 0.2 mg/dL Casts: NS (Not Seen)    Nitrites: Neg Trichomonas: Not Present    Leukocyte Esterase: 3+ leu/uL Mucous: Present      Epithelial Cells: NS (Not Seen)      Yeast: NS (Not Seen)      Sperm: Not Present    ASSESSMENT:      ICD-10 Details  1 GU:   Renal and ureteral calculus - N20.2 Acute, Systemic Symptoms   PLAN:            Medications New Meds: Phenazopyridine Hcl 100 mg tablet 1 tablet PO BID PRN for pain with urination  #20  0 Refill(s)  Hydrocodone-Acetaminophen 5 mg-325 mg tablet 1 tablet PO Q 6 H PRN for pain related to kidney stone and stent  #20  0 Refill(s)  Ondansetron Odt 4 mg tablet,disintegrating 1 tablet PO Q 6 H PRN nausea  #15  0 Refill(s)  Pharmacy Name:  [id number]  Address:  [address]   [place], [place] [id number]  Phone:  [phone]  Fax:  [phone]            Orders Labs CULTURE, URINE          Schedule Return Visit/Planned Activity: Next Available Appointment - Schedule Surgery          Document Letter(s):  Created for Patient:  Clinical Summary         Notes:   Urinalysis will be sent for precautionary culture today. Stone intervention was discussed in detail today. For ureteroscopy, the patient understands that there is a chance for a staged procedure. Patient also understands that there is risk for bleeding, infection, injury to surrounding organs, and general risks of anesthesia. The patient also understands the placement of a stent and the risks of stent placement including, risk for infection, the risk for pain, and the risk for injury. For ESWL, the patient understands that there is a chance of failure of procedure, there is also a risk for bruising, infection, bleeding, and injury to surrounding structures. The patient verbalized understanding to these risks.  I will follow-up with his urologist to ensure right sided ureteroscopy is the way he would like to go with this patient. He will need clearance from his cardiologist and pulmonologist. Dorene Sorrow will need to be completed in the hospital.   Pain and nausea medicine sent to his pharmacy as well as phenazopyridine for his dysuria.    Signed by Bartholomew Crews, NP on 10/28/22 at 12:44 PM (EDT

## 2022-11-25 NOTE — Discharge Instructions (Signed)

## 2022-11-25 NOTE — Anesthesia Procedure Notes (Signed)
Procedure Name: LMA Insertion Date/Time: 11/25/2022 9:48 AM  Performed by: Donna Bernard, CRNAPre-anesthesia Checklist: Patient identified, Emergency Drugs available, Suction available, Patient being monitored and Timeout performed Patient Re-evaluated:Patient Re-evaluated prior to induction Oxygen Delivery Method: Circle system utilized Preoxygenation: Pre-oxygenation with 100% oxygen Induction Type: IV induction Ventilation: Mask ventilation without difficulty LMA: LMA flexible inserted LMA Size: 5.0 Number of attempts: 1 Tube secured with: Tape Dental Injury: Teeth and Oropharynx as per pre-operative assessment

## 2022-11-25 NOTE — H&P (Signed)
CC/HPI: Pt presents today for pre-operative history and physical exam in anticipation of right PCNL by Dr. Alvester Morin on 11/25/22. She is doing well and without complaint.   Pt denies F/C, HA, CP, SOB, N/V, diarrhea/constipation, back pain, flank pain, hematuria, and dysuria.     HX:   CC: Right renal calculus  HPI:  08/19/2022  62 year old male with a history of a right renal calculus. She underwent a CT scan of the abdomen and pelvis without contrast on 08/15/2022 that revealed a 2 cm stone in the right renal pelvis increased in size from prior with associated hydronephrosis. She had 2 right and a single left lower pole renal calculus. Unremarkable bladder. She denies any flank pain but did have an episode of gross hematuria. Denies any dysuria, fever, chill, nausea, vomiting.     ALLERGIES: Sulfa - Hives    MEDICATIONS: Macrobid 100 mg capsule 1 capsule PO Q HS  Aleve  Xanax     GU PSH: None     PSH Notes: ? LEEP procedure 25 years ago    NON-GU PSH: None   GU PMH: Flank Pain - 10/08/2022 Renal calculus - 10/08/2022, - 08/19/2022 Ureteral obstruction secondary to calculous - 10/08/2022 Gross hematuria - 08/19/2022    NON-GU PMH: Anxiety Arthritis    FAMILY HISTORY: 1 Daughter - Other 1 son - Other   SOCIAL HISTORY: Marital Status: Married Preferred Language: English; Race: White Current Smoking Status: Patient has never smoked.   Tobacco Use Assessment Completed: Used Tobacco in last 30 days? Does drink.  Does not use drugs. Drinks 2 caffeinated drinks per day. Has not had a blood transfusion.     Notes: Rare ETOH   REVIEW OF SYSTEMS:    GU Review Male:   Patient denies frequent urination, hard to postpone urination, burning /pain with urination, get up at night to urinate, leakage of urine, stream starts and stops, trouble starting your stream, have to strain to urinate, and being pregnant.  Gastrointestinal (Upper):   Patient denies indigestion/ heartburn, nausea, and  vomiting.  Gastrointestinal (Lower):   Patient denies diarrhea and constipation.  Constitutional:   Patient denies fever, night sweats, weight loss, and fatigue.  Skin:   Patient denies skin rash/ lesion and itching.  Eyes:   Patient denies blurred vision and double vision.  Ears/ Nose/ Throat:   Patient denies sore throat and sinus problems.  Hematologic/Lymphatic:   Patient denies swollen glands and easy bruising.  Cardiovascular:   Patient denies leg swelling and chest pains.  Respiratory:   Patient denies cough and shortness of breath.  Endocrine:   Patient denies excessive thirst.  Musculoskeletal:   Patient denies back pain and joint pain.  Neurological:   Patient denies headaches and dizziness.  Psychologic:   Patient denies depression and anxiety.   VITAL SIGNS:      11/19/2022 03:23 PM  BP 143/83 mmHg  Pulse 103 /min  Temperature 98.2 F / 36.7 C   MULTI-SYSTEM PHYSICAL EXAMINATION:    Constitutional: Well-nourished. No physical deformities. Normally developed. Good grooming.  Neck: Neck symmetrical, not swollen. Normal tracheal position.  Respiratory: Normal breath sounds. No labored breathing, no use of accessory muscles.   Cardiovascular: Regular rate and rhythm. No murmur, no gallop.   Lymphatic: No enlargement of neck, axillae, groin.  Skin: No paleness, no jaundice, no cyanosis. No lesion, no ulcer, no rash.  Neurologic / Psychiatric: Oriented to time, oriented to place, oriented to person. No depression, no anxiety, no agitation.  Gastrointestinal: No mass, no tenderness, no rigidity, non obese abdomen.  Eyes: Normal conjunctivae. Normal eyelids.  Ears, Nose, Mouth, and Throat: Left ear no scars, no lesions, no masses. Right ear no scars, no lesions, no masses. Nose no scars, no lesions, no masses. Normal hearing. Normal lips.  Musculoskeletal: Normal gait and station of head and neck.     Complexity of Data:  Records Review:   Previous Patient Records  Urine Test  Review:   Urinalysis   11/19/22  Urinalysis  Urine Appearance Cloudy   Urine Color Yellow   Urine Glucose Neg mg/dL  Urine Bilirubin Neg mg/dL  Urine Ketones Neg mg/dL  Urine Specific Gravity 1.025   Urine Blood 3+ ery/uL  Urine pH 6.5   Urine Protein 3+ mg/dL  Urine Urobilinogen 0.2 mg/dL  Urine Nitrites Positive   Urine Leukocyte Esterase 3+ leu/uL  Urine WBC/hpf 40 - 60/hpf   Urine RBC/hpf 40 - 60/hpf   Urine Epithelial Cells 0 - 5/hpf   Urine Bacteria Many (>50/hpf)   Urine Mucous Not Present   Urine Yeast NS (Not Seen)   Urine Trichomonas Not Present   Urine Cystals NS (Not Seen)   Urine Casts NS (Not Seen)   Urine Sperm Not Present    PROCEDURES:          Urinalysis w/Scope - 81001 Dipstick Dipstick Cont'd Micro  Color: Yellow Bilirubin: Neg mg/dL WBC/hpf: 40 - 16/XWR  Appearance: Cloudy Ketones: Neg mg/dL RBC/hpf: 40 - 60/AVW  Specific Gravity: 1.025 Blood: 3+ ery/uL Bacteria: Many (>50/hpf)  pH: 6.5 Protein: 3+ mg/dL Cystals: NS (Not Seen)  Glucose: Neg mg/dL Urobilinogen: 0.2 mg/dL Casts: NS (Not Seen)    Nitrites: Positive Trichomonas: Not Present    Leukocyte Esterase: 3+ leu/uL Mucous: Not Present      Epithelial Cells: 0 - 5/hpf      Yeast: NS (Not Seen)      Sperm: Not Present    Notes: **Unspun micro**    ASSESSMENT:      ICD-10 Details  1 GU:   Renal calculus - N20.0    PLAN:            Medications New Meds: Cefdinir 300 mg capsule 1 capsule PO BID   #14  0 Refill(s)  Pharmacy Name:  [id number]  Address:  [address]   [place], [place] [address]  Phone:  [phone]  Fax:  [phone]    Stop Meds: Cephalexin 250 mg tablet 1 tablet PO Q HS  Start: 09/04/2022  Stop: 04/02/2023   Discontinue: 11/19/2022  - Reason: The medication cycle was completed.            Orders Labs Urine Culture          Schedule Return Visit/Planned Activity: Keep Scheduled Appointment - Schedule Surgery          Document Letter(s):   Created for Patient: Clinical Summary         Notes:   There are no changes in the patients history or physical exam since last evaluation by Dr. Alvester Morin. Pt is scheduled to undergo right PCNL on 11/25/22.   Pt with UTI on UA. Send urine for culture. Hold daily Macrobid and start Cefdinir x 1 week.   All pt's questions were answered to the best of my ability.           Next Appointment:      Next Appointment: 11/25/2022 11:00 AM    Appointment Type: Surgery  Location: [facility] [phone] [id number]    Provider: Modena Slater, III, M.D.    Reason for Visit: OBS WL RT PCNL- $250.00 09/3     Signed by Ulyses Amor, PA on 11/19/22 at 3:58 PM (EDT

## 2022-11-26 ENCOUNTER — Encounter (HOSPITAL_COMMUNITY): Payer: Self-pay | Admitting: Urology

## 2022-11-26 ENCOUNTER — Telehealth: Payer: Self-pay

## 2022-11-26 NOTE — Telephone Encounter (Signed)
..   Medicaid Managed Care   Unsuccessful Outreach Note  11/26/2022 Name: Frank Morales MRN: [id number] DOB: [birth year]  Referred by: Olegario Messier, MD Reason for referral : Appointment   An unsuccessful telephone outreach was attempted today. The patient was referred to the case management team for assistance with care management and care coordination.   Follow Up Plan: A HIPAA compliant phone message was left for the patient providing contact information and requesting a return call.  The care management team will reach out to the patient again over the next 7 days.   Weston Settle Care Guide  [facility] Managed  Care Guide [facility]  [phone]

## 2022-12-02 ENCOUNTER — Telehealth: Payer: Self-pay

## 2022-12-02 NOTE — Telephone Encounter (Signed)
..  Medicaid Managed Care   Unsuccessful Outreach Note  12/02/2022 Name: Frank Morales MRN: [id number] DOB: 04-05-1960  Referred by: Olegario Messier, MD Reason for referral : Appointment   A second unsuccessful telephone outreach was attempted today. The patient was referred to the case management team for assistance with care management and care coordination.   Follow Up Plan: A HIPAA compliant phone message was left for the patient providing contact information and requesting a return call.  The care management team will reach out to the patient again over the next 7 days.   Weston Settle Care Guide  [facility] Managed  Care Guide [facility]  [phone]

## 2022-12-03 ENCOUNTER — Ambulatory Visit (HOSPITAL_COMMUNITY): Payer: Medicaid Other

## 2022-12-03 ENCOUNTER — Telehealth: Payer: Self-pay

## 2022-12-03 NOTE — Telephone Encounter (Signed)
Marland Kitchen  Medicaid Managed Care   Unsuccessful Outreach Note  12/03/2022 Name: Frank Morales MRN: [id number] DOB: [birth year]  Referred by: Olegario Messier, MD Reason for referral : Appointment   Third unsuccessful telephone outreach was attempted today. The patient was referred to the case management team for assistance with care management and care coordination. The patient's primary care provider has been notified of our unsuccessful attempts to make or maintain contact with the patient. The care management team is pleased to engage with this patient at any time in the future should he/she be interested in assistance from the care management team.   Follow Up Plan: We have been unable to make contact with the patient for follow up. The care management team is available to follow up with the patient after provider conversation with the patient regarding recommendation for care management engagement and subsequent re-referral to the care management team.   Weston Settle Care Guide  [facility] Managed  Care Guide [facility]  [phone]

## 2022-12-08 DIAGNOSIS — I5032 Chronic diastolic (congestive) heart failure: Secondary | ICD-10-CM | POA: Diagnosis not present

## 2022-12-08 DIAGNOSIS — I509 Heart failure, unspecified: Secondary | ICD-10-CM | POA: Diagnosis not present

## 2022-12-10 DIAGNOSIS — N202 Calculus of kidney with calculus of ureter: Secondary | ICD-10-CM | POA: Diagnosis not present

## 2022-12-12 ENCOUNTER — Ambulatory Visit: Payer: Medicaid Other | Admitting: Allergy

## 2022-12-12 ENCOUNTER — Other Ambulatory Visit: Payer: Self-pay

## 2022-12-12 ENCOUNTER — Encounter: Payer: Self-pay | Admitting: Allergy

## 2022-12-12 VITALS — BP 124/58 | HR 82 | Temp 97.3°F | Wt 285.5 lb

## 2022-12-12 DIAGNOSIS — J302 Other seasonal allergic rhinitis: Secondary | ICD-10-CM | POA: Diagnosis not present

## 2022-12-12 DIAGNOSIS — J3089 Other allergic rhinitis: Secondary | ICD-10-CM | POA: Diagnosis not present

## 2022-12-12 DIAGNOSIS — T781XXD Other adverse food reactions, not elsewhere classified, subsequent encounter: Secondary | ICD-10-CM | POA: Diagnosis not present

## 2022-12-12 MED ORDER — FLUTICASONE PROPIONATE 50 MCG/ACT NA SUSP: NASAL | 1 refills | Status: DC

## 2022-12-12 MED ORDER — AZELASTINE HCL 0.1 % NA SOLN: NASAL | 1 refills | Status: DC

## 2022-12-12 MED ORDER — LEVOCETIRIZINE DIHYDROCHLORIDE 5 MG PO TABS: 5.0000 mg | ORAL_TABLET | Freq: Every evening | ORAL | 1 refills | Status: DC

## 2022-12-12 NOTE — Patient Instructions (Signed)
Chronic rhinitis - environmental allergy panel was positive to grass pollen, tree pollen, weed pollen, dust mites and cockroach.  Avoidance measures provided - continue Xyzal 5mg  daily.  - Flonase 2 sprays each nostril daily for 1-2 weeks at a time before stopping once nasal congestion improves for maximum benefit - Azelastine 2 sprays each nostril twice a day as needed for nasal drainage, post nasal drip or throat clearing - perform nasal steam treatments (warm towel) at night and then follow-up with nasal spray.   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  Food allergy could lead to life threatening symptoms  Sensitivity: occurs when you have IgE to a food (positive food testing either by skin testing or blood testing) but is a food you eat without any issues.  This is not an allergy and we recommend keeping the food in the diet  Intolerance: this is when you have negative testing by either skin testing or blood testing thus not allergic but the food causes symptoms (like belly pain, bloating, diarrhea etc) with ingestion.  These foods should be avoided to prevent symptoms.     Follow-up in 6 months or sooner if needed

## 2022-12-12 NOTE — Progress Notes (Signed)
Follow-up Note  RE: Frank Morales MRN: [id number] DOB: 1960/08/20 Date of Office Visit: 12/12/2022   History of present illness: Frank Morales is a 62 y.o. male presenting today for follow-up of allergic rhinitis and adverse food reaction.  He was last seen in the office on 09/13/2022 by myself.  Discussed the use of AI scribe software for clinical note transcription with the patient, who gave verbal consent to proceed.  The patient, with a history of allergies, reports improved breathing through the nose since the last visit in June. He has been using Azelastine and Flonase nasal sprays, as well as Xyzal allergy tablets, which were changed from Claritin. The patient takes one nasal spray in the morning and the other in the evening. He also occasionally uses a hot cloth treatment to help break up mucus in the nose before bedtime.  The patient has tested positive for allergies to dust mites, grass pollen, cockroach, tree pollen, and weed pollen.  He has been avoiding dairy in the diet to prevent symptoms .  The patient has not reported any changes in his oxygen needs. He has not experienced any new symptoms or changes in his health since the last visit.     Review of systems: 10pt ROS negative unless noted above in HPI  Past medical/social/surgical/family history have been reviewed and are unchanged unless specifically indicated below.  No changes  Medication List: Current Outpatient Medications  Medication Sig Dispense Refill   albuterol (VENTOLIN HFA) 108 (90 Base) MCG/ACT inhaler Inhale 2 puffs into the lungs every 6 (six) hours as needed for wheezing or shortness of breath. 8 g 2   ANORO ELLIPTA 62.5-25 MCG/ACT AEPB INHALE 1 PUFF INTO THE LUNGS DAILY 60 each 2   azelastine (ASTELIN) 0.1 % nasal spray USE 2 SPRAYS IN EACH NOSTRIL TWICE DAILY 90 mL 1   dapagliflozin propanediol (FARXIGA) 10 MG TABS tablet TAKE 1 TABLET(10 MG) BY MOUTH DAILY BEFORE BREAKFAST 90 tablet 3    diltiazem (CARDIZEM CD) 240 MG 24 hr capsule TAKE 2 CAPSULES(480 MG) BY MOUTH DAILY 180 capsule 3   ferrous sulfate 325 (65 FE) MG tablet Take 1 tablet (325 mg total) by mouth every Monday, Wednesday, and Friday. 30 tablet 3   fluticasone (FLOVENT HFA) 110 MCG/ACT inhaler Inhale 1 puff into the lungs daily.     furosemide (LASIX) 80 MG tablet Take 1 tablet (80 mg total) by mouth 2 (two) times daily. 180 tablet 3   levocetirizine (XYZAL) 5 MG tablet Take 1 tablet (5 mg total) by mouth every evening. 30 tablet 4   metolazone (ZAROXOLYN) 2.5 MG tablet Take 1 tablet (2.5 mg total) by mouth once a week. Take every Wednesday. 30 tablet 1   ondansetron (ZOFRAN-ODT) 4 MG disintegrating tablet Take 4 mg by mouth every 6 (six) hours as needed for nausea or vomiting.     pantoprazole (PROTONIX) 20 MG tablet Take 20 mg by mouth daily.     phenazopyridine (PYRIDIUM) 100 MG tablet Take 100 mg by mouth 2 (two) times daily as needed for pain.     polyethylene glycol (MIRALAX) 17 g packet Take 17 g by mouth daily. 14 each 0   potassium chloride SA (KLOR-CON M) 20 MEQ tablet Take 2 tablets (40 mEq total) by mouth 2 (two) times daily. 120 tablet 2   rivaroxaban (XARELTO) 20 MG TABS tablet Take 1 tablet (20 mg total) by mouth daily with supper. 90 tablet 2   senna-docusate (SENOKOT-S) 8.6-50  MG tablet Take 1 tablet by mouth daily. 30 tablet 0   amoxicillin-clavulanate (AUGMENTIN) 875-125 MG tablet Take 1 tablet by mouth 2 (two) times daily. (Patient not taking: Reported on 12/12/2022) 14 tablet 0   HYDROcodone-acetaminophen (NORCO) 5-325 MG tablet Take 1 tablet by mouth every 4 (four) hours as needed for moderate pain. (Patient not taking: Reported on 12/12/2022) 12 tablet 0   No current facility-administered medications for this visit.     Known medication allergies: Allergies  Allergen Reactions   Lactose Intolerance (Gi) Nausea And Vomiting     Physical examination: Blood pressure (!) 124/58, pulse 82,  temperature (!) 97.3 F (36.3 C), temperature source Temporal, weight 285 lb 8 oz (129.5 kg), SpO2 98%.  General: Alert, interactive, in no acute distress,  in place 3L. HEENT: PERRLA, TMs pearly gray, turbinates non-edematous without discharge, post-pharynx non erythematous. Neck: Supple without lymphadenopathy. Lungs: Clear to auscultation without wheezing, rhonchi or rales. {no increased work of breathing. CV: Normal S1, S2 without murmurs. Abdomen: Nondistended, nontender. Skin: Warm and dry, without lesions or rashes. Extremities:  No clubbing, cyanosis or edema. Neuro:   Grossly intact.  Diagnositics/Labs: Labs:  Component     Latest Ref Rng 09/13/2022  IgE (Immunoglobulin E), Serum     6 - 495 IU/mL 740 (H)   D Pteronyssinus IgE     Class 0 kU/L <0.10   D Farinae IgE     Class 0/I kU/L 0.18 !   Cat Dander IgE     Class 0 kU/L <0.10   Dog Dander IgE     Class 0 kU/L <0.10   [place] Grass IgE     Class 0/I kU/L 0.30 !   Timothy Grass IgE     Class 0/I kU/L 0.25 !   [place] Grass IgE     Class 0/I kU/L 0.27 !   Cockroach, [place] IgE     Class IV kU/L 6.15 !   Penicillium Chrysogen IgE     Class 0 kU/L <0.10   Cladosporium Herbarum IgE     Class 0 kU/L <0.10   Aspergillus Fumigatus IgE     Class 0 kU/L <0.10   Alternaria Alternata IgE     Class 0 kU/L <0.10   Maple/Box Elder IgE     Class 0/I kU/L 0.27 !   Common Silver Charletta Cousin IgE     Class 0/I kU/L 0.23 !   [place], [place] IgE     Class 0/I kU/L 0.31 !   Oak, [place] IgE     Class 0/I kU/L 0.24 !   Elm, American IgE     Class 0/I kU/L 0.27 !   [place] IgE     Class 0/I kU/L 0.28 !   Pecan, [place] IgE     Class 0/I kU/L 0.28 !   White Mulberry IgE     Class 0/I kU/L 0.19 !   Ragweed, Short IgE     Class 0/I kU/L 0.29 !   Pigweed, Rough IgE     Class 0/I kU/L 0.24 !   Sheep Sorrel IgE Qn     Class 0/I kU/L 0.26 !   Mouse Urine IgE     Class 0 kU/L <0.10   Milk IgE     Class 0 kU/L <0.10      Assessment and plan: Allergic rhinitis - environmental allergy panel was positive to grass pollen, tree pollen, weed pollen, dust mites and cockroach.  Avoidance measures provided - continue Xyzal 5mg   daily.  - Flonase 2 sprays each nostril daily for 1-2 weeks at a time before stopping once nasal congestion improves for maximum benefit - Azelastine 2 sprays each nostril twice a day as needed for nasal drainage, post nasal drip or throat clearing - perform nasal steam treatments (warm towel) at night and then follow-up with nasal spray.   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  Food allergy could lead to life threatening symptoms  Sensitivity: occurs when you have IgE to a food (positive food testing either by skin testing or blood testing) but is a food you eat without any issues.  This is not an allergy and we recommend keeping the food in the diet  Intolerance: this is when you have negative testing by either skin testing or blood testing thus not allergic but the food causes symptoms (like belly pain, bloating, diarrhea etc) with ingestion.  These foods should be avoided to prevent symptoms.     Follow-up in 6 months or sooner if needed  I appreciate the opportunity to take part in Lilburn's care. Please do not hesitate to contact me with questions.  Sincerely,   Margo Aye, MD Allergy/Immunology [facility]

## 2023-01-02 ENCOUNTER — Encounter (HOSPITAL_COMMUNITY): Payer: Self-pay | Admitting: Gastroenterology

## 2023-01-02 ENCOUNTER — Telehealth: Payer: Self-pay

## 2023-01-02 ENCOUNTER — Encounter (HOSPITAL_COMMUNITY): Payer: Self-pay | Admitting: Physician Assistant

## 2023-01-02 NOTE — Telephone Encounter (Signed)
Patient with diagnosis of afib on Xarelto for anticoagulation.    Procedure: EGD and colonoscopy Date of procedure: 01/09/23  CHA2DS2-VASc Score = 2  This indicates a 2.2% annual risk of stroke. The patient's score is based upon: CHF History: 1 HTN History: 1 Diabetes History: 0 Stroke History: 0 Vascular Disease History: 0 Age Score: 0 Gender Score: 0   CrCl >158mL/min Platelet count 380K  Per office protocol, patient can hold Xarelto for 1-2 days prior to procedure.    **This guidance is not considered finalized until pre-operative APP has relayed final recommendations.**

## 2023-01-02 NOTE — Telephone Encounter (Signed)
Left message for patient to call back & confirm that he will be attending procedure scheduled for 10/24.

## 2023-01-02 NOTE — Progress Notes (Signed)
Attempted to obtain medical history for pre op call via telephone, unable to reach at this time. HIPAA compliant voicemail message left requesting return call to pre surgical testing department.

## 2023-01-02 NOTE — Progress Notes (Signed)
Pre op call eval Name:[name] MD Cardiologist-[facility]  EKG-10/30/22 Echo-08/08/22 Cath-2017 Stress-n/a ICD/PM-n/a Blood thinner-Xarelto- unsure, will check with office for hold parameters GLP-1- n/a  Hx: Afib, CHF, COPD, Hep C, OSA, Pre-diabetes.  Patient was last seen by cardiology 8/14 this was a clearance appt for a procedure on 9/9. In that clearance, cardiology cleared him but did endorse pulm may want to do a clearance as well. I asked pt if he has seen pulm since last procedure and he said no. He remains on 3.5L of 02 continuous, that hasnt changed and he said his breathing is good, no sob or chest pains.   Anesthesia Review: Yes

## 2023-01-02 NOTE — Telephone Encounter (Signed)
Patient Name: Frank Morales  DOB: 02/17/1961 MRN: [id number]  Primary Cardiologist: Dietrich Pates, MD  Chart reviewed as part of pre-operative protocol coverage. Pre-op clearance already addressed by colleagues in earlier phone notes. To summarize recommendations:  -Per office protocol, patient can hold Xarelto for 1-2 days prior to procedure.  Please resume when medically safe to do so.  No medical clearance was requested.  Will route this bundled recommendation to requesting provider via Epic fax function and remove from pre-op pool. Please call with questions.  Sharlene Dory, PA-C 01/02/2023, 4:46 PM

## 2023-01-02 NOTE — Telephone Encounter (Signed)
Frank Morales [birth year] MRN: [id number]  Dear Dr. Thomasene Ripple,    We have scheduled the above named patient for an EGD & Colonoscopy on 01/09/23.    Please advise on clearance for patient to proceed with procedure as scheduled.    Please route your response to Glendora Score, RN or fax response to [phone]   Sincerely,  [facility]

## 2023-01-02 NOTE — Telephone Encounter (Signed)
Patient will be coming to his procedure on 10/24

## 2023-01-02 NOTE — Telephone Encounter (Signed)
[facility] Pre-operative Risk Assessment     Request for surgical clearance:     Endoscopy Procedure  What type of surgery is being performed?     EGD & Colon  When is this surgery scheduled?     01/09/23  What type of clearance is required ?   Pharmacy  Are there any medications that need to be held prior to surgery and how long? Xarelto  Practice name and name of physician performing surgery?      [facility]; Dr. Myrtie Neither  What is your office phone and fax number?      Phone- [phone]  Fax- [phone]  Anesthesia type (None, local, MAC, general) ?       MAC

## 2023-01-03 ENCOUNTER — Ambulatory Visit (HOSPITAL_COMMUNITY): Payer: Medicaid Other

## 2023-01-03 NOTE — Telephone Encounter (Signed)
Spoke with patient & he's aware to hold Xarelto starting two days prior to procedure.

## 2023-01-03 NOTE — Telephone Encounter (Signed)
Fax has also been sent to Dr. Thomasene Ripple at [phone].

## 2023-01-07 ENCOUNTER — Ambulatory Visit (HOSPITAL_COMMUNITY)
Admission: RE | Admit: 2023-01-07 | Discharge: 2023-01-07 | Disposition: A | Discharge status: Home / Self Care | Payer: Medicaid Other | Source: Ambulatory Visit | Attending: Internal Medicine | Admitting: Internal Medicine

## 2023-01-07 DIAGNOSIS — I509 Heart failure, unspecified: Secondary | ICD-10-CM | POA: Diagnosis not present

## 2023-01-07 DIAGNOSIS — I5032 Chronic diastolic (congestive) heart failure: Secondary | ICD-10-CM | POA: Diagnosis not present

## 2023-01-07 DIAGNOSIS — R918 Other nonspecific abnormal finding of lung field: Secondary | ICD-10-CM | POA: Diagnosis present

## 2023-01-07 NOTE — Telephone Encounter (Signed)
Inbound call from patient, would like to discuss medication management for his upcoming procedure on 10/24.

## 2023-01-07 NOTE — Telephone Encounter (Signed)
Pt questioned his medications for his procedure. Pt verbalized understanding with all questions answered.

## 2023-01-08 NOTE — Telephone Encounter (Signed)
Yes, it sounds like he probably has volume overload and his procedure must be canceled.  He needs to go on our hospital procedure wait list.  Please remind him to resume his Xarelto that he was briefly holding for the procedure.  Ellwood Dense MD

## 2023-01-08 NOTE — Telephone Encounter (Signed)
Inbound call from patient, states she has an "immense amount of fluid build up" and she would like to discuss rescheduling with a nurse.

## 2023-01-08 NOTE — Telephone Encounter (Signed)
Pt stated that he was going to have to reschedule his Hospital procedure due to fluid build up. Pt scheduled for tomorrow 01/09/2023 at Hospital. Pt stated that he over did it at [employer] homecoming and now his legs are real tight, can barely walk, and having Shortness of breath. Pt stated that he plans to go to the ER today. Please advise if pt to be rescheduled for future date or if pt needs to be put on wait list.

## 2023-01-08 NOTE — Telephone Encounter (Signed)
Pt was made aware of Dr Myrtie Neither recommendations: Procedure has been canceled.  Pt was added to hospital wait list. Pt notified to resume his Xarelto that he was briefly holding for the procedure. Pt verbalized understanding with all questions answered.

## 2023-01-09 ENCOUNTER — Ambulatory Visit (HOSPITAL_COMMUNITY): Admission: RE | Admit: 2023-01-09 | Payer: Medicaid Other | Source: Home / Self Care | Admitting: Gastroenterology

## 2023-01-09 SURGERY — COLONOSCOPY WITH PROPOFOL
Anesthesia: Monitor Anesthesia Care

## 2023-01-09 NOTE — Plan of Care (Signed)
CHL Tonsillectomy/Adenoidectomy, Postoperative PEDS care plan entered in error.

## 2023-01-22 DIAGNOSIS — N202 Calculus of kidney with calculus of ureter: Secondary | ICD-10-CM | POA: Diagnosis not present

## 2023-02-06 ENCOUNTER — Ambulatory Visit (INDEPENDENT_AMBULATORY_CARE_PROVIDER_SITE_OTHER): Payer: Medicaid Other | Admitting: Student

## 2023-02-06 ENCOUNTER — Encounter: Payer: Self-pay | Admitting: Student

## 2023-02-06 VITALS — BP 122/59 | HR 89 | Ht 71.0 in | Wt 318.6 lb

## 2023-02-06 DIAGNOSIS — H109 Unspecified conjunctivitis: Secondary | ICD-10-CM | POA: Diagnosis present

## 2023-02-06 DIAGNOSIS — R918 Other nonspecific abnormal finding of lung field: Secondary | ICD-10-CM

## 2023-02-06 DIAGNOSIS — E876 Hypokalemia: Secondary | ICD-10-CM

## 2023-02-06 DIAGNOSIS — J309 Allergic rhinitis, unspecified: Secondary | ICD-10-CM

## 2023-02-06 DIAGNOSIS — H538 Other visual disturbances: Secondary | ICD-10-CM | POA: Diagnosis not present

## 2023-02-06 DIAGNOSIS — I5032 Chronic diastolic (congestive) heart failure: Secondary | ICD-10-CM

## 2023-02-06 MED ORDER — POTASSIUM CHLORIDE CRYS ER 20 MEQ PO TBCR: 40.0000 meq | EXTENDED_RELEASE_TABLET | Freq: Two times a day (BID) | ORAL | 2 refills | Status: DC

## 2023-02-06 MED ORDER — LEVOCETIRIZINE DIHYDROCHLORIDE 5 MG PO TABS
5.0000 mg | ORAL_TABLET | Freq: Every evening | ORAL | 1 refills | Status: DC
Start: 2023-02-06 — End: 2023-02-18

## 2023-02-06 MED ORDER — POLYMYXIN B-TRIMETHOPRIM 10000-0.1 UNIT/ML-% OP SOLN: 1.0000 [drp] | OPHTHALMIC | 0 refills | Status: AC

## 2023-02-06 NOTE — Progress Notes (Signed)
CC: Follow up. Eye discomfort and drainage.  HPI:  [name] is a 62 y.o. male living with a history stated below and presents today for check up of chronic disease and eye concerns, see below. Please see problem based assessment and plan for additional details.  Past Medical History:  Diagnosis Date   Acute exacerbation of CHF (congestive heart failure) (HCC) 12/15/2019   Acute heart failure (HCC) 12/16/2019   Anemia    Angio-edema    Arthritis    Atrial fibrillation and flutter (HCC) 08/17/2015   A. S/p failed DCCV // b. Severe BAE on echo >> rate control strategy (has seen AF clinic) // c. Xarelto for anticoag (CHADS2-VASc=2 / CHF, HTN)   Chronic diastolic CHF (congestive heart failure) (HCC) 08/30/2015   A. Echo 6/17: Apical HK, moderate focal basal and mild concentric LVH, EF 50-55, diffuse HK, trivial MR, severe BAE, mild TR, PASP 37   COPD (chronic obstructive pulmonary disease) (HCC)    Dependence on continuous supplemental oxygen    3L   Dyspnea    Dysrhythmia    Afib   Hematuria 01/10/2021   Hepatic fibrosis 02/29/2016   F3/4 on elastography.    Hepatitis C, chronic (HCC) 08/19/2015   Hepatitis C, chronic (HCC) 08/19/2015   Treated in 2017 with SVR   History of cardiac catheterization    a. LHC 6/17: LAD irregs, o/w no CAD   History of kidney stones    Hypertension    Hyposmia 12/22/2017   OSA (obstructive sleep apnea) 11/21/2015   No Cpap   Oxygen dependent    Pleural effusion on right 03/14/2018   Pre-diabetes    Sleep apnea    Tobacco abuse     Current Outpatient Medications on File Prior to Visit  Medication Sig Dispense Refill   albuterol (VENTOLIN HFA) 108 (90 Base) MCG/ACT inhaler Inhale 2 puffs into the lungs every 6 (six) hours as needed for wheezing or shortness of breath. 8 g 2   ANORO ELLIPTA 62.5-25 MCG/ACT AEPB INHALE 1 PUFF INTO THE LUNGS DAILY 60 each 2   azelastine (ASTELIN) 0.1 % nasal spray 2 sprays each nostril twice a day as  needed 90 mL 1   dapagliflozin propanediol (FARXIGA) 10 MG TABS tablet TAKE 1 TABLET(10 MG) BY MOUTH DAILY BEFORE BREAKFAST 90 tablet 3   diltiazem (CARDIZEM CD) 240 MG 24 hr capsule TAKE 2 CAPSULES(480 MG) BY MOUTH DAILY 180 capsule 3   ferrous sulfate 325 (65 FE) MG tablet Take 1 tablet (325 mg total) by mouth every Monday, Wednesday, and Friday. 30 tablet 3   fluticasone (FLONASE) 50 MCG/ACT nasal spray 2 sprays each nostril twice a day as needed 36 g 1   fluticasone (FLOVENT HFA) 110 MCG/ACT inhaler Inhale 1 puff into the lungs daily.     furosemide (LASIX) 80 MG tablet Take 1 tablet (80 mg total) by mouth 2 (two) times daily. 180 tablet 3   metolazone (ZAROXOLYN) 2.5 MG tablet Take 1 tablet (2.5 mg total) by mouth once a week. Take every Wednesday. 30 tablet 1   ondansetron (ZOFRAN-ODT) 4 MG disintegrating tablet Take 4 mg by mouth every 6 (six) hours as needed for nausea or vomiting.     pantoprazole (PROTONIX) 20 MG tablet Take 20 mg by mouth daily.     phenazopyridine (PYRIDIUM) 100 MG tablet Take 100 mg by mouth 2 (two) times daily as needed for pain.     polyethylene glycol (MIRALAX) 17 g packet Take  17 g by mouth daily. 14 each 0   rivaroxaban (XARELTO) 20 MG TABS tablet Take 1 tablet (20 mg total) by mouth daily with supper. 90 tablet 2   senna-docusate (SENOKOT-S) 8.6-50 MG tablet Take 1 tablet by mouth daily. 30 tablet 0   No current facility-administered medications on file prior to visit.    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     Occupation: unemployed  Tobacco Use   Smoking status: Every Day    Current packs/day: 0.50    Types: Cigarettes    Passive exposure: Current   Smokeless tobacco: Never   Tobacco comments:    5 per day  Vaping Use   Vaping status: Never Used  Substance and Sexual Activity   Alcohol use: Yes    Alcohol/week: 14.0 standard drinks of alcohol    Types: 14 Cans of beer per week    Comment: 6 pk in a week   Drug use: No   Sexual activity: Not on file  Other Topics Concern   Not on file  Social History Narrative   ** Merged History Encounter **       Social Determinants of Health   Financial Resource Strain: Not on file  Food Insecurity: No Food Insecurity (04/16/2022)   Hunger Vital Sign    Worried About Running Out of Food in the Last Year: Never true    Ran Out of Food in the Last Year: Never true  Transportation Needs: Unmet Transportation Needs (04/16/2022)   PRAPARE - Administrator, Civil Service (Medical): Yes    Lack of Transportation (Non-Medical): Yes  Physical Activity: Not on file  Stress: Not on file  Social Connections: Socially Isolated (04/16/2022)   Social Connection and Isolation Panel [NHANES]    Frequency of Communication with Friends and Family: More than three times a week    Frequency of Social Gatherings with Friends and Family: Twice a week    Attends Religious Services: Never    Database administrator or Organizations: No    Attends Banker Meetings: Never    Marital Status: Never married  Intimate Partner Violence: Not At Risk (04/16/2022)   Humiliation, Afraid, Rape, and Kick questionnaire    Fear of Current or Ex-Partner: No    Emotionally Abused: No    Physically Abused: No    Sexually Abused: No    Review of Systems: ROS negative except for what is noted on the assessment and plan.  Vitals:   02/06/23 1028  BP: (!) 122/59  Pulse: 89  SpO2: 98%  Weight: (!) 318 lb 9.6 oz (144.5 kg)  Height: 5\' 11"  (1.803 m)     Physical Exam: Constitutional: chronically ill-appearing man sitting in wheelchair, in no acute distress HENT: normocephalic atraumatic, mucous membranes moist Eyes: bilateral conjunctiva are very enflamed, much dried mucus on lashes, no gross lesion, no drainage, EOMS intact, PERRLA Cardiovascular: irregular  rate and rhythm, no m/r/g Pulmonary/Chest: normal work of breathing on room air, lungs clear to auscultation bilaterally. Using supplemental oxygen. MSK: normal bulk and tone Neurological: alert & oriented x 3, no focal deficit Skin: warm and dry Psych: normal mood and behavior  Assessment & Plan:   Patient seen with Dr. Lafonda Mosses  Bacterial conjunctivitis He is complaining of bilateral eye drainage and discomfort for 3 weeks.  There is mild pain although he attributes that mostly to rubbing the eyes.  There is no itching.  Reports purulent drainage although there is none present today.  The conjunctiva are certainly inflamed.  EOMs are intact.  There are no acute vision changes.  No fevers or chills or other symptoms to make me think that there is any sort of emergency going on here.  No sick contacts.  However due to report of purulent drainage I will treat as bacterial conjunctivitis. - Polymyxin trimethoprim eyedrops every 4 hours x 7 days  Blurry vision Bilateral.  No visual field deficits noted.  Not acute or new, not worsened by his complaints of eye discomfort.  This is gradual over time.  He once wore glasses but lost them.  He does not wear contacts.  Will refer him to ophthalmology.  Chronic diastolic CHF (congestive heart failure) (HCC) He is following with cardiology.  He weight today appears grossly stable over the last 6 months, he appears to fluctuate somewhat being as low as 298 at an earlier office visit at another provider.  Here he is 318 she is similar to his mass this recent summer.  No overt signs of heart failure.  Respiratory status remains at baseline, he does  use chronic oxygen therapy. Plan - continue Farxiga 10, Lasix 80 twice daily, per cardiology  Allergic rhinitis Pt prescribed Xyzal, not taking, will resume, might improve eye discomfort.  Abnormal CT scan of lung Recent CT scan revealed coronary artery calcification.  Will check vascular health with a lipid panel, start statin if indicated.  Katheran James, D.O. [facility] Internal Medicine, PGY-1 Phone: [phone] Date 02/06/2023 Time 5:09 PM

## 2023-02-06 NOTE — Assessment & Plan Note (Addendum)
He is complaining of bilateral eye drainage and discomfort for 3 weeks.  There is mild pain although he attributes that mostly to rubbing the eyes.  There is no itching.  Reports purulent drainage although there is none present today.  The conjunctiva are certainly inflamed.  EOMs are intact.  There are no acute vision changes.  No fevers or chills or other symptoms to make me think that there is any sort of emergency going on here.  No sick contacts.  However due to report of purulent drainage I will treat as bacterial conjunctivitis. - Polymyxin trimethoprim eyedrops every 4 hours x 7 days

## 2023-02-06 NOTE — Assessment & Plan Note (Addendum)
Bilateral.  No visual field deficits noted.  Not acute or new, not worsened by his complaints of eye discomfort.  This is gradual over time.  He once wore glasses but lost them.  He does not wear contacts.  Will refer him to ophthalmology.

## 2023-02-06 NOTE — Patient Instructions (Addendum)
Mr Frank Morales,  For your eyes, I will prescribe a bacterial eyedrop in case there is a bacterial infection. Place one drop into each eye every four hours. Do this for seven days. Keep your eyes clean, but do not wash your eyes immediately after you use the drops to ensure the medicine stays in place. I will also restart an allergy medicine called xyzal. Please take this in the evening. It may help with the discomfort.  I have placed a referral to ophthalmology for eyeglasses.  Please return for general checkup in 3 months. Return sooner for new concerns or if your eyes do not improve.

## 2023-02-06 NOTE — Assessment & Plan Note (Signed)
Recent CT scan revealed coronary artery calcification.  Will check vascular health with a lipid panel, start statin if indicated.

## 2023-02-06 NOTE — Assessment & Plan Note (Signed)
Pt prescribed Xyzal, not taking, will resume, might improve eye discomfort.

## 2023-02-06 NOTE — Assessment & Plan Note (Signed)
He is following with cardiology.  He weight today appears grossly stable over the last 6 months, he appears to fluctuate somewhat being as low as 298 at an earlier office visit at another provider.  Here he is 318 she is similar to his mass this recent summer.  No overt signs of heart failure.  Respiratory status remains at baseline, he does use chronic oxygen therapy. Plan - continue Farxiga 10, Lasix 80 twice daily, per cardiology

## 2023-02-07 DIAGNOSIS — I5032 Chronic diastolic (congestive) heart failure: Secondary | ICD-10-CM | POA: Diagnosis not present

## 2023-02-07 DIAGNOSIS — I509 Heart failure, unspecified: Secondary | ICD-10-CM | POA: Diagnosis not present

## 2023-02-07 LAB — LIPID PANEL
Chol/HDL Ratio: 2.7 ratio (ref 0.0–5.0)
Cholesterol, Total: 154 mg/dL (ref 100–199)
HDL: 58 mg/dL (ref >39)
LDL Chol Calc (NIH): 80 mg/dL (ref 0–99)
Triglycerides: 88 mg/dL (ref 0–149)
VLDL Cholesterol Cal: 16 mg/dL (ref 5–40)

## 2023-02-07 LAB — BMP8+ANION GAP
Anion Gap: 13 mmol/L (ref 10.0–18.0)
BUN/Creatinine Ratio: 20 (ref 10–24)
BUN: 19 mg/dL (ref 8–27)
CO2: 33 mmol/L — ABNORMAL HIGH (ref 20–29)
Calcium: 8.9 mg/dL (ref 8.6–10.2)
Chloride: 92 mmol/L — ABNORMAL LOW (ref 96–106)
Creatinine, Ser: 0.96 mg/dL (ref 0.76–1.27)
Glucose: 91 mg/dL (ref 70–99)
Potassium: 3.9 mmol/L (ref 3.5–5.2)
Sodium: 138 mmol/L (ref 134–144)
eGFR: 90 mL/min/{1.73_m2} (ref >59)

## 2023-02-07 NOTE — Progress Notes (Signed)
Internal Medicine Clinic Attending  I was physically present during the key portions of the resident provided service and participated in the medical decision making of patient's management care. I reviewed pertinent patient test results.  The assessment, diagnosis, and plan were formulated together and I agree with the documentation in the resident's note.  Frank Eon, MD    I share Dr Scarlette Shorts concern for acute bacterial conjunctivitis and agree with his treatment plan. Patient does not have eye pain or acute vision changes

## 2023-02-17 NOTE — Progress Notes (Unsigned)
Cardiology Office Note   Date:  02/18/2023   ID:  Frank Morales, DOB [birth year], MRN [id number]  PCP:  Olegario Messier, MD  Cardiologist:   Dietrich Pates, MD   Pt presents for f/u of diastolic CHF    History of Present Illness: Frank Morales is a 62 y.o. male with a history of HFpEF, OSA, persistant atrial fibrillation/flutter, COPD (on chronic O2) with continued tobacco abuse, HTN and substance abuse  In June 2017 he was admitted for CHF and  atrial flutter.   He underwent  Cardiac cath in June 2021 which showed no significant CAD   Echo showed LVEF was 50 to 55%     He was placed on Xarelto and underwent DCCV in July 2017  Echo in April 2021 LVEF 55 to 60%  He has had several admits for CHF, last in September 2021   I last saw the pt in clinic in Dec 202  I saw the pt in May 2023  He was last seen in cardiology by [name] in Aug 2024    The pt comes in today markedly SOB   Huffing and puffing      Denies CP  No dizziness   No palpitations     No cough  No fevers        Current Meds  Medication Sig   albuterol (VENTOLIN HFA) 108 (90 Base) MCG/ACT inhaler Inhale 2 puffs into the lungs every 6 (six) hours as needed for wheezing or shortness of breath.   ANORO ELLIPTA 62.5-25 MCG/ACT AEPB INHALE 1 PUFF INTO THE LUNGS DAILY   azelastine (ASTELIN) 0.1 % nasal spray 2 sprays each nostril twice a day as needed   dapagliflozin propanediol (FARXIGA) 10 MG TABS tablet TAKE 1 TABLET(10 MG) BY MOUTH DAILY BEFORE BREAKFAST   diltiazem (CARDIZEM CD) 240 MG 24 hr capsule TAKE 2 CAPSULES(480 MG) BY MOUTH DAILY   ferrous sulfate 325 (65 FE) MG tablet Take 1 tablet (325 mg total) by mouth every Monday, Wednesday, and Friday.   fluticasone (FLONASE) 50 MCG/ACT nasal spray 2 sprays each nostril twice a day as needed   fluticasone (FLOVENT HFA) 110 MCG/ACT inhaler Inhale 1 puff into the lungs daily.   furosemide (LASIX) 80 MG tablet Take 1 tablet (80 mg total) by mouth 2 (two) times daily.    levocetirizine (XYZAL) 5 MG tablet Take 1 tablet (5 mg total) by mouth every evening.   metolazone (ZAROXOLYN) 2.5 MG tablet Take 1 tablet (2.5 mg total) by mouth once a week. Take every Wednesday.   ondansetron (ZOFRAN-ODT) 4 MG disintegrating tablet Take 4 mg by mouth every 6 (six) hours as needed for nausea or vomiting.   pantoprazole (PROTONIX) 20 MG tablet Take 20 mg by mouth daily.   phenazopyridine (PYRIDIUM) 100 MG tablet Take 100 mg by mouth 2 (two) times daily as needed for pain.   polyethylene glycol (MIRALAX) 17 g packet Take 17 g by mouth daily.   potassium chloride SA (KLOR-CON M) 20 MEQ tablet Take 2 tablets (40 mEq total) by mouth 2 (two) times daily.   rivaroxaban (XARELTO) 20 MG TABS tablet Take 1 tablet (20 mg total) by mouth daily with supper.   senna-docusate (SENOKOT-S) 8.6-50 MG tablet Take 1 tablet by mouth daily.     Allergies:   Lactose intolerance (gi)   Past Medical History:  Diagnosis Date   Acute exacerbation of CHF (congestive heart failure) (HCC) 12/15/2019   Acute heart failure (HCC) 12/16/2019  Anemia    Angio-edema    Arthritis    Atrial fibrillation and flutter (HCC) 08/17/2015   A. S/p failed DCCV // b. Severe BAE on echo >> rate control strategy (has seen AF clinic) // c. Xarelto for anticoag (CHADS2-VASc=2 / CHF, HTN)   Chronic diastolic CHF (congestive heart failure) (HCC) 08/30/2015   A. Echo 6/17: Apical HK, moderate focal basal and mild concentric LVH, EF 50-55, diffuse HK, trivial MR, severe BAE, mild TR, PASP 37   COPD (chronic obstructive pulmonary disease) (HCC)    Dependence on continuous supplemental oxygen    3L   Dyspnea    Dysrhythmia    Afib   Hematuria 01/10/2021   Hepatic fibrosis 02/29/2016   F3/4 on elastography.    Hepatitis C, chronic (HCC) 08/19/2015   Hepatitis C, chronic (HCC) 08/19/2015   Treated in 2017 with SVR   History of cardiac catheterization    a. LHC 6/17: LAD irregs, o/w no CAD   History of kidney  stones    Hypertension    Hyposmia 12/22/2017   OSA (obstructive sleep apnea) 11/21/2015   No Cpap   Oxygen dependent    Pleural effusion on right 03/14/2018   Pre-diabetes    Sleep apnea    Tobacco abuse     Past Surgical History:  Procedure Laterality Date   CARDIAC CATHETERIZATION N/A 08/21/2015   Procedure: Right/Left Heart Cath and Coronary Angiography;  Surgeon: Marykay Lex, MD;  Location: [facility] INVASIVE CV LAB;  Service: Cardiovascular;  Laterality: N/A;   CARDIOVERSION N/A 09/22/2015   Procedure: CARDIOVERSION;  Surgeon: Wendall Stade, MD;  Location: [facility] ENDOSCOPY;  Service: Cardiovascular;  Laterality: N/A;   COLONOSCOPY N/A 07/19/2016   Procedure: COLONOSCOPY;  Surgeon: Sherrilyn Rist, MD;  Location: [facility] ENDOSCOPY;  Service: Gastroenterology;  Laterality: N/A;   CYSTOSCOPY WITH URETEROSCOPY AND STENT PLACEMENT Right 10/01/2022   Procedure: CYSTOSCOPY WITH URETEROSCOPY AND STENT PLACEMENT;  Surgeon: Crista Elliot, MD;  Location: [facility] OR;  Service: Urology;  Laterality: Right;   CYSTOSCOPY/URETEROSCOPY/HOLMIUM LASER/STENT PLACEMENT Right 11/25/2022   Procedure: CYSTOSCOPY RIGHT RETROGRADE PYELOGRAM RIGHT URETEROSCOPY/HOLMIUM LASER/STENT EXCHANGE;  Surgeon: Crista Elliot, MD;  Location: [facility] ORS;  Service: Urology;  Laterality: Right;  60 MINS FOR CASE   ESOPHAGOGASTRODUODENOSCOPY N/A 07/19/2016   Procedure: ESOPHAGOGASTRODUODENOSCOPY (EGD);  Surgeon: Sherrilyn Rist, MD;  Location: [facility] ENDOSCOPY;  Service: Gastroenterology;  Laterality: N/A;   IR THORACENTESIS ASP PLEURAL SPACE W/IMG GUIDE  07/09/2019   SINUS ENDO WITH FUSION N/A 10/02/2018   Procedure: SINUS ENDO WITH FUSION;  Surgeon: Suzanna Obey, MD;  Location: [facility] OR;  Service: ENT;  Laterality: N/A;     Social History:  The patient  reports that he has been smoking cigarettes. He has been exposed to tobacco smoke. He has never used smokeless tobacco. He reports current alcohol use of about 14.0 standard drinks of  alcohol per week. He reports that he does not use drugs.   Family History:  The patient's family history includes Allergic rhinitis in his mother; Asthma in his mother; Diabetes in his mother; Hypertension in his maternal grandfather and mother; Pneumonia in his father.    ROS:  Please see the history of present illness. All other systems are reviewed and  Negative to the above problem except as noted.    PHYSICAL EXAM: VS:  BP 128/60   Ht 5\' 11"  (1.803 m)   Wt (!) 322 lb (146.1 kg)   BMI 44.91  kg/m    On 3.5 L   Sats 80s to 90s   (difficult to get pulse)  GEN:  Morbidly obese 62 yo  in no acute distress  Examined in chair  HEENT: normal  Neck: JVP elevated  Cardiac: Irreg irreg Distant   ; no murmurs; Distant   1-2+ LE  edema Wearing suppor socks  Respiratory: Very labored with any movement    Decreased airflow   GI: Distended  nontender   MS: no deformity Moving all extremities   Skin: warm and dry, no rash  EKG:  EKG is not done    Lipid Panel    Component Value Date/Time   CHOL 154 02/06/2023 1110   TRIG 88 02/06/2023 1110   HDL 58 02/06/2023 1110   CHOLHDL 2.7 02/06/2023 1110   CHOLHDL 3.9 12/16/2019 0807   VLDL 15 12/16/2019 0807   LDLCALC 80 02/06/2023 1110      Wt Readings from Last 3 Encounters:  02/18/23 (!) 322 lb (146.1 kg)  02/06/23 (!) 318 lb 9.6 oz (144.5 kg)  12/12/22 285 lb 8 oz (129.5 kg)      ASSESSMENT AND PLAN:  1 HFpEF   Pt 's breathing is very labored today  Sats difficult to get but marginal at best     Exam with volume overload I would recomm tx to [facility] for further care   2  COPD   Pt on 3.5 L at home   Sats today decompensate with movement    Airflow is down   Denies F/   no cough       3  Atrial fibrillation/ flutter EKG not done but HR is fast and sl irrecular  Remains on anticoagulation    4  HTN  BP is well controlled.  Follow   5  Hematuria   hx of    Pt did go to bathroom here   Not saved        Current medicines are  reviewed at length with the patient today.  The patient does not have concerns regarding medicines.  Signed, Dietrich Pates, MD  02/18/2023 12:25 PM    [facility] [address] [place], [place], [place]  [address] Phone: [phone]; Fax: [phone]

## 2023-02-18 ENCOUNTER — Emergency Department (HOSPITAL_COMMUNITY): Payer: Medicaid Other

## 2023-02-18 ENCOUNTER — Ambulatory Visit: Payer: Medicaid Other | Attending: Internal Medicine | Admitting: Internal Medicine

## 2023-02-18 ENCOUNTER — Other Ambulatory Visit: Payer: Self-pay

## 2023-02-18 ENCOUNTER — Inpatient Hospital Stay (HOSPITAL_COMMUNITY)
Admission: EM | Admit: 2023-02-18 | Discharge: 2023-02-21 | DRG: 871 | Disposition: A | Discharge status: Home / Self Care | Payer: Medicaid Other | Source: Ambulatory Visit | Attending: Internal Medicine | Admitting: Internal Medicine

## 2023-02-18 ENCOUNTER — Encounter (HOSPITAL_COMMUNITY): Payer: Self-pay

## 2023-02-18 ENCOUNTER — Encounter: Payer: Self-pay | Admitting: Internal Medicine

## 2023-02-18 VITALS — BP 128/60 | Ht 71.0 in | Wt 322.0 lb

## 2023-02-18 DIAGNOSIS — R000 Tachycardia, unspecified: Secondary | ICD-10-CM | POA: Diagnosis present

## 2023-02-18 DIAGNOSIS — Z87442 Personal history of urinary calculi: Secondary | ICD-10-CM

## 2023-02-18 DIAGNOSIS — I5030 Unspecified diastolic (congestive) heart failure: Secondary | ICD-10-CM | POA: Diagnosis not present

## 2023-02-18 DIAGNOSIS — A419 Sepsis, unspecified organism: Secondary | ICD-10-CM | POA: Diagnosis not present

## 2023-02-18 DIAGNOSIS — R059 Cough, unspecified: Secondary | ICD-10-CM | POA: Diagnosis not present

## 2023-02-18 DIAGNOSIS — R0602 Shortness of breath: Secondary | ICD-10-CM | POA: Diagnosis not present

## 2023-02-18 DIAGNOSIS — I509 Heart failure, unspecified: Secondary | ICD-10-CM

## 2023-02-18 DIAGNOSIS — R509 Fever, unspecified: Secondary | ICD-10-CM | POA: Diagnosis not present

## 2023-02-18 DIAGNOSIS — I110 Hypertensive heart disease with heart failure: Principal | ICD-10-CM | POA: Diagnosis present

## 2023-02-18 DIAGNOSIS — Z1152 Encounter for screening for COVID-19: Secondary | ICD-10-CM

## 2023-02-18 DIAGNOSIS — J9621 Acute and chronic respiratory failure with hypoxia: Secondary | ICD-10-CM | POA: Diagnosis present

## 2023-02-18 DIAGNOSIS — Z7951 Long term (current) use of inhaled steroids: Secondary | ICD-10-CM

## 2023-02-18 DIAGNOSIS — R6520 Severe sepsis without septic shock: Secondary | ICD-10-CM | POA: Diagnosis not present

## 2023-02-18 DIAGNOSIS — R591 Generalized enlarged lymph nodes: Secondary | ICD-10-CM | POA: Diagnosis present

## 2023-02-18 DIAGNOSIS — E669 Obesity, unspecified: Secondary | ICD-10-CM | POA: Diagnosis present

## 2023-02-18 DIAGNOSIS — I5032 Chronic diastolic (congestive) heart failure: Secondary | ICD-10-CM

## 2023-02-18 DIAGNOSIS — I4891 Unspecified atrial fibrillation: Secondary | ICD-10-CM | POA: Diagnosis not present

## 2023-02-18 DIAGNOSIS — Z7901 Long term (current) use of anticoagulants: Secondary | ICD-10-CM

## 2023-02-18 DIAGNOSIS — Z6830 Body mass index (BMI) 30.0-30.9, adult: Secondary | ICD-10-CM

## 2023-02-18 DIAGNOSIS — I5033 Acute on chronic diastolic (congestive) heart failure: Secondary | ICD-10-CM | POA: Diagnosis present

## 2023-02-18 DIAGNOSIS — J449 Chronic obstructive pulmonary disease, unspecified: Secondary | ICD-10-CM | POA: Diagnosis present

## 2023-02-18 DIAGNOSIS — E739 Lactose intolerance, unspecified: Secondary | ICD-10-CM | POA: Diagnosis present

## 2023-02-18 DIAGNOSIS — Z79899 Other long term (current) drug therapy: Secondary | ICD-10-CM

## 2023-02-18 DIAGNOSIS — R911 Solitary pulmonary nodule: Secondary | ICD-10-CM | POA: Diagnosis present

## 2023-02-18 DIAGNOSIS — Z7984 Long term (current) use of oral hypoglycemic drugs: Secondary | ICD-10-CM

## 2023-02-18 DIAGNOSIS — N12 Tubulo-interstitial nephritis, not specified as acute or chronic: Secondary | ICD-10-CM | POA: Diagnosis present

## 2023-02-18 DIAGNOSIS — N3000 Acute cystitis without hematuria: Secondary | ICD-10-CM | POA: Diagnosis not present

## 2023-02-18 DIAGNOSIS — R0689 Other abnormalities of breathing: Secondary | ICD-10-CM | POA: Diagnosis not present

## 2023-02-18 DIAGNOSIS — J90 Pleural effusion, not elsewhere classified: Secondary | ICD-10-CM | POA: Diagnosis not present

## 2023-02-18 DIAGNOSIS — E876 Hypokalemia: Secondary | ICD-10-CM | POA: Diagnosis present

## 2023-02-18 DIAGNOSIS — Z560 Unemployment, unspecified: Secondary | ICD-10-CM

## 2023-02-18 DIAGNOSIS — I4821 Permanent atrial fibrillation: Secondary | ICD-10-CM | POA: Diagnosis present

## 2023-02-18 DIAGNOSIS — N200 Calculus of kidney: Secondary | ICD-10-CM | POA: Diagnosis present

## 2023-02-18 LAB — I-STAT CHEM 8, ED
BUN: 14 mg/dL (ref 8–23)
Calcium, Ion: 1.12 mmol/L — ABNORMAL LOW (ref 1.15–1.40)
Chloride: 92 mmol/L — ABNORMAL LOW (ref 98–111)
Creatinine, Ser: 0.9 mg/dL (ref 0.61–1.24)
Glucose, Bld: 100 mg/dL — ABNORMAL HIGH (ref 70–99)
HCT: 32 % — ABNORMAL LOW (ref 39.0–52.0)
Hemoglobin: 10.9 g/dL — ABNORMAL LOW (ref 13.0–17.0)
Potassium: 4.5 mmol/L (ref 3.5–5.1)
Sodium: 136 mmol/L (ref 135–145)
TCO2: 33 mmol/L — ABNORMAL HIGH (ref 22–32)

## 2023-02-18 LAB — I-STAT VENOUS BLOOD GAS, ED
Acid-Base Excess: 9 mmol/L — ABNORMAL HIGH (ref 0.0–2.0)
Bicarbonate: 37.8 mmol/L — ABNORMAL HIGH (ref 20.0–28.0)
Calcium, Ion: 1.15 mmol/L (ref 1.15–1.40)
HCT: 32 % — ABNORMAL LOW (ref 39.0–52.0)
Hemoglobin: 10.9 g/dL — ABNORMAL LOW (ref 13.0–17.0)
O2 Saturation: 85 %
Potassium: 4.5 mmol/L (ref 3.5–5.1)
Sodium: 136 mmol/L (ref 135–145)
TCO2: 40 mmol/L — ABNORMAL HIGH (ref 22–32)
pCO2, Ven: 73.1 mm[Hg] (ref 44–60)
pH, Ven: 7.322 (ref 7.25–7.43)
pO2, Ven: 57 mm[Hg] — ABNORMAL HIGH (ref 32–45)

## 2023-02-18 LAB — CBC WITH DIFFERENTIAL/PLATELET
Abs Immature Granulocytes: 0.03 10*3/uL (ref 0.00–0.07)
Basophils Absolute: 0.1 10*3/uL (ref 0.0–0.1)
Basophils Relative: 1 %
Eosinophils Absolute: 0 10*3/uL (ref 0.0–0.5)
Eosinophils Relative: 1 %
HCT: 29.3 % — ABNORMAL LOW (ref 39.0–52.0)
Hemoglobin: 9.1 g/dL — ABNORMAL LOW (ref 13.0–17.0)
Immature Granulocytes: 0 %
Lymphocytes Relative: 12 %
Lymphs Abs: 0.9 10*3/uL (ref 0.7–4.0)
MCH: 25.9 pg — ABNORMAL LOW (ref 26.0–34.0)
MCHC: 31.1 g/dL (ref 30.0–36.0)
MCV: 83.5 fL (ref 80.0–100.0)
Monocytes Absolute: 1.2 10*3/uL — ABNORMAL HIGH (ref 0.1–1.0)
Monocytes Relative: 16 %
Neutro Abs: 5 10*3/uL (ref 1.7–7.7)
Neutrophils Relative %: 70 %
Platelets: 271 10*3/uL (ref 150–400)
RBC: 3.51 MIL/uL — ABNORMAL LOW (ref 4.22–5.81)
RDW: 23.5 % — ABNORMAL HIGH (ref 11.5–15.5)
WBC: 7.2 10*3/uL (ref 4.0–10.5)
nRBC: 0 % (ref 0.0–0.2)

## 2023-02-18 LAB — URINALYSIS, W/ REFLEX TO CULTURE (INFECTION SUSPECTED)
Bilirubin Urine: NEGATIVE
Glucose, UA: 150 mg/dL — AB
Ketones, ur: NEGATIVE mg/dL
Nitrite: NEGATIVE
Protein, ur: NEGATIVE mg/dL
Specific Gravity, Urine: 1.01 (ref 1.005–1.030)
pH: 7 (ref 5.0–8.0)

## 2023-02-18 LAB — TROPONIN I (HIGH SENSITIVITY)
Troponin I (High Sensitivity): 9 ng/L (ref <18)
Troponin I (High Sensitivity): 9 ng/L (ref <18)

## 2023-02-18 LAB — COMPREHENSIVE METABOLIC PANEL
ALT: 13 U/L (ref 0–44)
AST: 18 U/L (ref 15–41)
Albumin: 3.4 g/dL — ABNORMAL LOW (ref 3.5–5.0)
Alkaline Phosphatase: 56 U/L (ref 38–126)
Anion gap: 9 (ref 5–15)
BUN: 12 mg/dL (ref 8–23)
CO2: 32 mmol/L (ref 22–32)
Calcium: 8.7 mg/dL — ABNORMAL LOW (ref 8.9–10.3)
Chloride: 95 mmol/L — ABNORMAL LOW (ref 98–111)
Creatinine, Ser: 0.86 mg/dL (ref 0.61–1.24)
GFR, Estimated: >60 mL/min (ref >60)
Glucose, Bld: 94 mg/dL (ref 70–99)
Potassium: 4.4 mmol/L (ref 3.5–5.1)
Sodium: 136 mmol/L (ref 135–145)
Total Bilirubin: 0.7 mg/dL (ref <1.2)
Total Protein: 8.2 g/dL — ABNORMAL HIGH (ref 6.5–8.1)

## 2023-02-18 LAB — RESP PANEL BY RT-PCR (RSV, FLU A&B, COVID)  RVPGX2
Influenza A by PCR: NEGATIVE
Influenza B by PCR: NEGATIVE
Resp Syncytial Virus by PCR: NEGATIVE
SARS Coronavirus 2 by RT PCR: NEGATIVE

## 2023-02-18 LAB — I-STAT CG4 LACTIC ACID, ED
Lactic Acid, Venous: 1.3 mmol/L (ref 0.5–1.9)
Lactic Acid, Venous: 0.7 mmol/L (ref 0.5–1.9)

## 2023-02-18 LAB — BRAIN NATRIURETIC PEPTIDE: B Natriuretic Peptide: 131.9 pg/mL — ABNORMAL HIGH (ref 0.0–100.0)

## 2023-02-18 MED ORDER — ACETAMINOPHEN 325 MG PO TABS
650.0000 mg | ORAL_TABLET | Freq: Once | ORAL | Status: AC
  Administered 2023-02-18: 650 mg via ORAL
  Filled 2023-02-18: qty 2

## 2023-02-18 MED ORDER — SODIUM CHLORIDE 0.9 % IV BOLUS (SEPSIS)
1000.0000 mL | Freq: Once | INTRAVENOUS | Status: AC
Start: 2023-02-18 — End: 2023-02-18
  Administered 2023-02-18: 1000 mL via INTRAVENOUS

## 2023-02-18 MED ORDER — METRONIDAZOLE 500 MG/100ML IV SOLN
500.0000 mg | Freq: Once | INTRAVENOUS | Status: AC
  Administered 2023-02-18: 500 mg via INTRAVENOUS
  Filled 2023-02-18: qty 100

## 2023-02-18 MED ORDER — DILTIAZEM HCL ER COATED BEADS 240 MG PO CP24
240.0000 mg | ORAL_CAPSULE | Freq: Two times a day (BID) | ORAL | Status: DC
  Administered 2023-02-19 – 2023-02-21 (×5): 240 mg via ORAL
  Filled 2023-02-18 (×6): qty 1

## 2023-02-18 MED ORDER — VANCOMYCIN HCL IN DEXTROSE 1-5 GM/200ML-% IV SOLN
1000.0000 mg | Freq: Once | INTRAVENOUS | Status: AC
  Administered 2023-02-18: 1000 mg via INTRAVENOUS
  Filled 2023-02-18: qty 200

## 2023-02-18 MED ORDER — SODIUM CHLORIDE 0.9 % IV SOLN: 2.0000 g | INTRAVENOUS | Status: DC

## 2023-02-18 MED ORDER — SODIUM CHLORIDE 0.9 % IV SOLN
1.0000 g | INTRAVENOUS | Status: DC
  Administered 2023-02-18 – 2023-02-20 (×3): 1 g via INTRAVENOUS
  Filled 2023-02-18 (×3): qty 10

## 2023-02-18 MED ORDER — VANCOMYCIN HCL IN DEXTROSE 1-5 GM/200ML-% IV SOLN: 1000.0000 mg | Freq: Once | INTRAVENOUS | Status: DC

## 2023-02-18 MED ORDER — METOLAZONE 2.5 MG PO TABS
2.5000 mg | ORAL_TABLET | Freq: Once | ORAL | Status: AC
  Administered 2023-02-18: 2.5 mg via ORAL
  Filled 2023-02-18: qty 1

## 2023-02-18 MED ORDER — SODIUM CHLORIDE 0.9 % IV SOLN: 500.0000 mg | INTRAVENOUS | Status: DC

## 2023-02-18 MED ORDER — DAPAGLIFLOZIN PROPANEDIOL 10 MG PO TABS
10.0000 mg | ORAL_TABLET | Freq: Every day | ORAL | Status: DC
  Administered 2023-02-19 – 2023-02-21 (×3): 10 mg via ORAL
  Filled 2023-02-18 (×3): qty 1

## 2023-02-18 MED ORDER — FUROSEMIDE 10 MG/ML IJ SOLN
20.0000 mg | Freq: Once | INTRAMUSCULAR | Status: AC
  Administered 2023-02-18: 20 mg via INTRAVENOUS
  Filled 2023-02-18: qty 2

## 2023-02-18 MED ORDER — ACETAMINOPHEN 500 MG PO TABS
1000.0000 mg | ORAL_TABLET | Freq: Once | ORAL | Status: AC
  Administered 2023-02-18: 1000 mg via ORAL
  Filled 2023-02-18: qty 2

## 2023-02-18 MED ORDER — RIVAROXABAN 20 MG PO TABS
20.0000 mg | ORAL_TABLET | Freq: Every day | ORAL | Status: DC
  Administered 2023-02-18 – 2023-02-20 (×3): 20 mg via ORAL
  Filled 2023-02-18: qty 2
  Filled 2023-02-18: qty 1
  Filled 2023-02-18: qty 2

## 2023-02-18 MED ORDER — SODIUM CHLORIDE 0.9 % IV SOLN
2.0000 g | Freq: Once | INTRAVENOUS | Status: AC
  Administered 2023-02-18: 2 g via INTRAVENOUS
  Filled 2023-02-18: qty 12.5

## 2023-02-18 NOTE — ED Provider Triage Note (Signed)
Emergency Medicine Provider Triage Evaluation Note  MICCO GOODFELLOW , a 62 y.o. male  was evaluated in triage.  Pt complains of shortness of breath.  Patient referred from cardiology office for evaluation of shortness of breath and apparent likely volume overload.  Patient is febrile on arrival.  Patient complains of weight gain, increased dyspnea with exertion.  He denies chest pain.   Review of Systems  Positive: Shortness of breath, weakness Negative: Chest pain  Physical Exam  Ht 5\' 11"  (1.803 m)   Wt 99.8 kg   BMI 30.68 kg/m  Gen:   Awake, no distress   Resp:  Normal effort, decreased breath sounds at bases bilaterally MSK:   Moves extremities without difficulty  Other:    Medical Decision Making  Medically screening exam initiated at 1:09 PM.  Appropriate orders placed.  Quan Forney Vannote was informed that the remainder of the evaluation will be completed by another provider, this initial triage assessment does not replace that evaluation, and the importance of remaining in the ED until their evaluation is complete.  Initial screening labs and imaging ordered.  Patient understands MSE process.  RN staff aware of plan of care.   Wynetta Fines, MD 02/18/23 [phone]

## 2023-02-18 NOTE — ED Provider Notes (Signed)
Care seen from previous writer at shift change.  See note for full HPI   In summation 62 year old history of CHF, Morales-fib sent in by cardiology for fever.  He was seen by cardiology today noted to be hypoxic with ambulation on his home oxygen as well as noted to have Morales fever.  He appears to be volume overloaded.  He is compliant his medications.  3 L of oxygen at baseline.  Code sepsis called by initial provider, started on broad-spectrum antibiotics.  States he has history of UTI with similar presentation.  In only given 500 cc/kg bolus due to volume overload.  His blood pressures are at his baseline.  FU on UA and imaging Physical Exam  BP 105/63   Pulse (!) 129   Temp (!) 100.4 F (38 C) (Oral)   Resp (!) 25   Ht 5\' 11"  (1.803 m)   Wt 99.8 kg   SpO2 100%   BMI 30.68 kg/m   Physical Exam Vitals and nursing note reviewed.  Constitutional:      General: He is not in acute distress.    Appearance: He is well-developed. He is not ill-appearing or diaphoretic.  HENT:     Head: Atraumatic.  Eyes:     Pupils: Pupils are equal, round, and reactive to light.  Cardiovascular:     Rate and Rhythm: Normal rate and regular rhythm.     Comments: Clear but diminished breath sounds bilaterally, speaks in full sentences. Pulmonary:     Effort: Pulmonary effort is normal. No respiratory distress.  Abdominal:     General: Bowel sounds are normal. There is no distension.     Palpations: Abdomen is soft.     Tenderness: There is no abdominal tenderness. There is no guarding or rebound.  Musculoskeletal:        General: No swelling, tenderness, deformity or signs of injury. Normal range of motion.     Cervical back: Normal range of motion and neck supple.  Skin:    General: Skin is warm and dry.     Capillary Refill: Capillary refill takes less than 2 seconds.  Neurological:     General: No focal deficit present.     Mental Status: He is alert and oriented to person, place, and time.      Procedures  .Critical Care  Performed by: Linwood Dibbles, PA-C Authorized by: Linwood Dibbles, PA-C   Critical care provider statement:    Critical care time (minutes):  35   Critical care was necessary to treat or prevent imminent or life-threatening deterioration of the following conditions:  Sepsis   Critical care was time spent personally by me on the following activities:  Development of treatment plan with patient or surrogate, discussions with consultants, evaluation of patient's response to treatment, examination of patient, ordering and review of laboratory studies, ordering and review of radiographic studies, ordering and performing treatments and interventions, pulse oximetry, re-evaluation of patient's condition and review of old charts  Labs Reviewed  CBC WITH DIFFERENTIAL/PLATELET - Abnormal; Notable for the following components:      Result Value   RBC 3.51 (*)    Hemoglobin 9.1 (*)    HCT 29.3 (*)    MCH 25.9 (*)    RDW 23.5 (*)    Monocytes Absolute 1.2 (*)    All other components within normal limits  BRAIN NATRIURETIC PEPTIDE - Abnormal; Notable for the following components:   B Natriuretic Peptide 131.9 (*)    All  other components within normal limits  COMPREHENSIVE METABOLIC PANEL - Abnormal; Notable for the following components:   Chloride 95 (*)    Calcium 8.7 (*)    Total Protein 8.2 (*)    Albumin 3.4 (*)    All other components within normal limits  URINALYSIS, W/ REFLEX TO CULTURE (INFECTION SUSPECTED) - Abnormal; Notable for the following components:   Glucose, UA 150 (*)    Hgb urine dipstick MODERATE (*)    Leukocytes,Ua LARGE (*)    Bacteria, UA RARE (*)    All other components within normal limits  I-STAT CHEM 8, ED - Abnormal; Notable for the following components:   Chloride 92 (*)    Glucose, Bld 100 (*)    Calcium, Ion 1.12 (*)    TCO2 33 (*)    Hemoglobin 10.9 (*)    HCT 32.0 (*)    All other components within normal limits   I-STAT VENOUS BLOOD GAS, ED - Abnormal; Notable for the following components:   pCO2, Ven 73.1 (*)    pO2, Ven 57 (*)    Bicarbonate 37.8 (*)    TCO2 40 (*)    Acid-Base Excess 9.0 (*)    HCT 32.0 (*)    Hemoglobin 10.9 (*)    All other components within normal limits  RESP PANEL BY RT-PCR (RSV, FLU Morales&B, COVID)  [id number]  CULTURE, BLOOD (ROUTINE X 2)  CULTURE, BLOOD (ROUTINE X 2)  URINE CULTURE  BASIC METABOLIC PANEL  CBC  I-STAT CG4 LACTIC ACID, ED  I-STAT CG4 LACTIC ACID, ED  TROPONIN I (HIGH SENSITIVITY)  TROPONIN I (HIGH SENSITIVITY)   CT CHEST ABDOMEN PELVIS WO CONTRAST  Result Date: 02/18/2023 CLINICAL DATA:  Fever, tachycardia, and shortness of breath. Sepsis. EXAM: CT CHEST, ABDOMEN AND PELVIS WITHOUT CONTRAST TECHNIQUE: Multidetector CT imaging of the chest, abdomen and pelvis was performed following the standard protocol without IV contrast. RADIATION DOSE REDUCTION: This exam was performed according to the departmental dose-optimization program which includes automated exposure control, adjustment of the mA and/or kV according to patient size and/or use of iterative reconstruction technique. COMPARISON:  Same day chest radiograph, CT chest dated 01/07/2023, CT abdomen and pelvis dated 10/01/2022 FINDINGS: CT CHEST FINDINGS Decreased sensitivity and specificity for detailed findings due to motion artifact. Cardiovascular: Multichamber cardiomegaly. No significant pericardial fluid/thickening. Main pulmonary artery measures 3.6 cm, unchanged. Ascending aorta measures 4.1 cm. Mediastinum/Nodes: Imaged thyroid gland without nodules meeting criteria for imaging follow-up by size. Normal esophagus. Unchanged right paratracheal lymph node measures 11 mm (2:36). Lungs/Pleura: The central airways are patent. Unchanged round atelectasis in the right middle and lower lobes. Similar upper lung predominant paraseptal emphysema. Similar right lung volume loss. New 9 x 6 mm subsolid right upper lobe  nodule (3:68) no pneumothorax. Unchanged small right pleural effusion with loculated component. Musculoskeletal: No acute or abnormal lytic or blastic osseous lesions. CT ABDOMEN PELVIS FINDINGS Hepatobiliary: No focal hepatic lesions. No intra or extrahepatic biliary ductal dilation. Normal gallbladder. Pancreas: No focal lesions or main ductal dilation. Spleen: Normal in size without focal abnormality. Adrenals/Urinary Tract: No adrenal nodules. No suspicious renal mass on this noncontrast enhanced examination or hydronephrosis. Bilateral nonobstructing renal stones measuring up to 5 mm. Asymmetric right perinephric stranding, likely sequela prior infection/inflammation. No focal bladder wall thickening. Stomach/Bowel: Normal appearance of the stomach. No evidence of bowel wall thickening, distention, or inflammatory changes. Normal appendix. Vascular/Lymphatic: Aortic atherosclerosis. Unchanged 11 mm pericaval lymph node (2:86) and 11 mm aortocaval lymph node (  2:87). Increased size of bilateral pelvic lymph nodes, for example 12 mm right common iliac lymph node (2:112), previously 4 mm, and 13 mm left external iliac lymph node (2:119), previously 11 mm. Reproductive: Prostate is unremarkable. Other: No free fluid, fluid collection, or free air. Musculoskeletal: No acute or abnormal lytic or blastic osseous findings. Multilevel degenerative changes of the lumbar spine. IMPRESSION: Decreased sensitivity and specificity for detailed findings due to motion artifact. CHEST: 1. New 9 x 6 mm subsolid right upper lobe nodule, likely infectious/inflammatory. Follow-up non-contrast CT recommended at 3-6 months to confirm persistence. If unchanged, and solid component remains <6 mm, annual CT is recommended until 5 years of stability has been established. If persistent these nodules should be considered highly suspicious if the solid component of the nodule is 6 mm or greater in size and enlarging. This recommendation  follows the consensus statement: Guidelines for Management of Incidental Pulmonary Nodules Detected on CT Images: From the Fleischner Society 2017; Radiology 2017; 284:228-243. 2. Unchanged chronic small right pleural effusion with loculated component and round atelectasis in the right middle and lower lobes. 3. Unchanged enlarged main pulmonary artery, which can be seen in the setting of pulmonary hypertension. 4. Multichamber cardiomegaly. Ascending thoracic aorta measures 4.1 cm. Recommend annual imaging followup by CTA or MRA. This recommendation follows 2010 ACCF/AHA/AATS/ACR/ASA/SCA/SCAI/SIR/STS/SVM Guidelines for the Diagnosis and Management of Patients with Thoracic Aortic Disease. Circulation. 2010; 121: Z610-R604. Aortic aneurysm NOS (ICD10-I71.9) ABDOMEN/PELVIS: 1. Multi station abdominopelvic lymphadenopathy with increased size of bilateral pelvic lymph nodes, nonspecific. Recommend correlation with laboratory findings of lymphoproliferative process. 2. Bilateral nonobstructing renal stones measuring up to 5 mm. Asymmetric right perinephric stranding, likely sequela of prior infection/inflammation. Pyelonephritis can have Morales similar appearance. Aortic Atherosclerosis (ICD10-I70.0) and Emphysema (ICD10-J43.9). Electronically Signed   By: Agustin Cree M.D.   On: 02/18/2023 19:37   DG Chest 1 View  Result Date: 02/18/2023 CLINICAL DATA:  Shortness of breath.  History of CHF and COPD. EXAM: CHEST  1 VIEW COMPARISON:  Chest radiograph dated October 01, 2022. FINDINGS: Stable cardiomegaly. Pulmonary vascular congestion with small right-greater-than-left pleural effusions. No pneumothorax. No acute osseous abnormality. IMPRESSION: Cardiomegaly with pulmonary vascular congestion and small right-greater-than-left pleural effusions. Electronically Signed   By: Hart Robinsons M.D.   On: 02/18/2023 14:15    ED Course / MDM   Clinical Course as of 02/18/23 2113  Tue Feb 18, 2023  1523 Sepsis, hx of CHF, fluid  overloaded. Poss UTI vs PNA [BH]  2002 IM teaching to admit [BH]    Clinical Course User Index [BH] Frank Ferg A, PA-C   Care seen from previous writer at shift change.  See note for full HPI   In summation 62 year old history of CHF, Morales-fib sent in by cardiology for fever.  He was seen by cardiology today noted to be hypoxic with ambulation on his home oxygen as well as noted to have Morales fever.  He appears to be volume overloaded.  He is compliant this no medications.  3 L of oxygen at baseline.  Code sepsis called by initial provider, started on broad-spectrum antibiotics.  States he has history of UTI with similar presentation.  In only given 500 cc/kg bolus due to volume overload.  His blood pressures are at his baseline.  FU on UA and imaging  Labs and imaging personally viewed and interpreted:  CBC without leukocytosis, hemoglobin 9.1 Metabolic panel without significant abnormality COVID, flu, RSV negative Chest x-ray with fluid overload BNP 131 UA  moderate blood, large leuks, 250 WBC, rare bacteria Trop 9 Lactic 1.3---0.7  Reassessed.  Was placed on 5 L up from his baseline of 3 for comfort per respiratory therapy  Will get CT C/Morales/P with plan on likely admission for sepsis.  Hemodynamically stable.  CT chest abdomen pelvis shows likely infectious nodule as well as pulmonary hypertension.  He also has changes consistent with pyelonephritis however no obstructing stones.  He was ready started on broad-spectrum antibiotics.  He is normotensive, lactic acid 0.7, will hold on 30/cc/kg bolus of fluids at this time given he appears grossly fluid overloaded.  Does not meet septic shock criteria.  Discussed with medicine team for admission  The patient appears reasonably stabilized for admission considering the current resources, flow, and capabilities available in the ED at this time, and I doubt any other [facility] requiring further screening and/or treatment in the ED prior to  admission.      Medical Decision Making Amount and/or Complexity of Data Reviewed External Data Reviewed: labs, radiology, ECG and notes. Labs: ordered. Decision-making details documented in ED Course. Radiology: ordered and independent interpretation performed. Decision-making details documented in ED Course. ECG/medicine tests: ordered and independent interpretation performed. Decision-making details documented in ED Course.  Risk OTC drugs. Prescription drug management. Parenteral controlled substances. Decision regarding hospitalization. Diagnosis or treatment significantly limited by social determinants of health.          Dekota Kirlin A, PA-C 02/18/23 2113    Benjiman Core, MD 02/18/23 2223

## 2023-02-18 NOTE — ED Provider Notes (Signed)
[place] [facility] DE[facility]MENT AT [facility] Provider Note   CSN: [id number] Arrival date & time: 02/18/23  1254     History  Chief Complaint  Patient presents with   Fever    Frank Morales is a 62 y.o. male, history of CHF, A-fib, who presents to the ED secondary to being sent by cardiology, for further evaluation.  He states he has been doing pretty well lately, just had a persistent cough, that is baseline for him.  States he was told that he had a fever, by cardiology, and that he needed to come to the ER.  He denies any chest pain, abdominal pain, nausea, vomiting.  Has not had any runny nose or sore throat.  Is typically on 3 L of O2.  Has been compliant with medications.  Home Medications Prior to Admission medications   Medication Sig Start Date End Date Taking? Authorizing Provider  albuterol (VENTOLIN HFA) 108 (90 Base) MCG/ACT inhaler Inhale 2 puffs into the lungs every 6 (six) hours as needed for wheezing or shortness of breath. 05/23/20   Seawell, Jaimie A, DO  ANORO ELLIPTA 62.5-25 MCG/ACT AEPB INHALE 1 PUFF INTO THE LUNGS DAILY 05/07/22   Nooruddin, Jason Fila, MD  azelastine (ASTELIN) 0.1 % nasal spray 2 sprays each nostril twice a day as needed 12/12/22   Marcelyn Bruins, MD  dapagliflozin propanediol (FARXIGA) 10 MG TABS tablet TAKE 1 TABLET(10 MG) BY MOUTH DAILY BEFORE BREAKFAST 08/20/22   Nooruddin, Jason Fila, MD  diltiazem (CARDIZEM CD) 240 MG 24 hr capsule TAKE 2 CAPSULES(480 MG) BY MOUTH DAILY 10/30/22   Jodelle Gross, NP  ferrous sulfate 325 (65 FE) MG tablet Take 1 tablet (325 mg total) by mouth every Monday, Wednesday, and Friday. 08/28/22 08/28/23  Nooruddin, Jason Fila, MD  fluticasone Aleda Grana) 50 MCG/ACT nasal spray 2 sprays each nostril twice a day as needed 12/12/22   Marcelyn Bruins, MD  fluticasone (FLOVENT HFA) 110 MCG/ACT inhaler Inhale 1 puff into the lungs daily.    [provider]  furosemide (LASIX) 80 MG tablet Take 1 tablet  (80 mg total) by mouth 2 (two) times daily. 10/30/22   Jodelle Gross, NP  levocetirizine (XYZAL) 5 MG tablet Take 1 tablet (5 mg total) by mouth every evening. 02/06/23   Katheran James, DO  metolazone (ZAROXOLYN) 2.5 MG tablet Take 1 tablet (2.5 mg total) by mouth once a week. Take every Wednesday. 10/30/22   Jodelle Gross, NP  ondansetron (ZOFRAN-ODT) 4 MG disintegrating tablet Take 4 mg by mouth every 6 (six) hours as needed for nausea or vomiting. 10/25/22   [provider]  pantoprazole (PROTONIX) 20 MG tablet Take 20 mg by mouth daily. 11/26/22   [provider]  phenazopyridine (PYRIDIUM) 100 MG tablet Take 100 mg by mouth 2 (two) times daily as needed for pain. 10/25/22   [provider]  polyethylene glycol (MIRALAX) 17 g packet Take 17 g by mouth daily. 10/25/22   Faith Rogue, DO  potassium chloride SA (KLOR-CON M) 20 MEQ tablet Take 2 tablets (40 mEq total) by mouth 2 (two) times daily. 02/06/23 05/07/23  Katheran James, DO  rivaroxaban (XARELTO) 20 MG TABS tablet Take 1 tablet (20 mg total) by mouth daily with supper. 10/30/22   Jodelle Gross, NP  senna-docusate (SENOKOT-S) 8.6-50 MG tablet Take 1 tablet by mouth daily. 10/25/22   Faith Rogue, DO      Allergies    Lactose intolerance (gi)  Review of Systems   Review of Systems  Constitutional:  Positive for fever.  Respiratory:  Positive for shortness of breath.   Cardiovascular:  Negative for chest pain.    Physical Exam Updated Vital Signs BP 102/67   Pulse 99   Temp (!) 103 F (39.4 C) (Oral)   Resp (!) 26   Ht 5\' 11"  (1.803 m)   Wt 99.8 kg   SpO2 100%   BMI 30.68 kg/m  Physical Exam Vitals and nursing note reviewed.  Constitutional:      General: He is not in acute distress.    Appearance: He is well-developed.  HENT:     Head: Normocephalic and atraumatic.  Eyes:     Conjunctiva/sclera: Conjunctivae normal.  Cardiovascular:     Rate and Rhythm: Normal rate  and regular rhythm.     Heart sounds: No murmur heard. Pulmonary:     Effort: Pulmonary effort is normal. No accessory muscle usage or respiratory distress.     Breath sounds: Rales present.     Comments: +3L O2 Abdominal:     Palpations: Abdomen is soft.     Tenderness: There is no abdominal tenderness.  Musculoskeletal:        General: No swelling.     Cervical back: Neck supple.  Skin:    General: Skin is warm and dry.     Capillary Refill: Capillary refill takes less than 2 seconds.  Neurological:     Mental Status: He is alert.  Psychiatric:        Mood and Affect: Mood normal.     ED Results / Procedures / Treatments   Labs (all labs ordered are listed, but only abnormal results are displayed) Labs Reviewed  CBC WITH DIFFERENTIAL/PLATELET - Abnormal; Notable for the following components:      Result Value   RBC 3.51 (*)    Hemoglobin 9.1 (*)    HCT 29.3 (*)    MCH 25.9 (*)    RDW 23.5 (*)    All other components within normal limits  I-STAT CHEM 8, ED - Abnormal; Notable for the following components:   Chloride 92 (*)    Glucose, Bld 100 (*)    Calcium, Ion 1.12 (*)    TCO2 33 (*)    Hemoglobin 10.9 (*)    HCT 32.0 (*)    All other components within normal limits  I-STAT VENOUS BLOOD GAS, ED - Abnormal; Notable for the following components:   pCO2, Ven 73.1 (*)    pO2, Ven 57 (*)    Bicarbonate 37.8 (*)    TCO2 40 (*)    Acid-Base Excess 9.0 (*)    HCT 32.0 (*)    Hemoglobin 10.9 (*)    All other components within normal limits  CULTURE, BLOOD (ROUTINE X 2)  CULTURE, BLOOD (ROUTINE X 2)  RESP PANEL BY RT-PCR (RSV, FLU A&B, COVID)  RVPGX2  BRAIN NATRIURETIC PEPTIDE  COMPREHENSIVE METABOLIC PANEL  URINALYSIS, W/ REFLEX TO CULTURE (INFECTION SUSPECTED)  I-STAT CG4 LACTIC ACID, ED  I-STAT CG4 LACTIC ACID, ED  TROPONIN I (HIGH SENSITIVITY)  TROPONIN I (HIGH SENSITIVITY)    EKG None  Radiology DG Chest 1 View  Result Date: 02/18/2023 CLINICAL DATA:   Shortness of breath.  History of CHF and COPD. EXAM: CHEST  1 VIEW COMPARISON:  Chest radiograph dated October 01, 2022. FINDINGS: Stable cardiomegaly. Pulmonary vascular congestion with Deserai Cansler right-greater-than-left pleural effusions. No pneumothorax. No acute osseous abnormality. IMPRESSION: Cardiomegaly with pulmonary  vascular congestion and Yakov Bergen right-greater-than-left pleural effusions. Electronically Signed   By: Hart Robinsons M.D.   On: 02/18/2023 14:15    Procedures Procedures    Medications Ordered in ED Medications  vancomycin (VANCOCIN) IVPB 1000 mg/200 mL premix (1,000 mg Intravenous New Bag/Given 02/18/23 1447)    Followed by  vancomycin (VANCOCIN) IVPB 1000 mg/200 mL premix (has no administration in time range)  acetaminophen (TYLENOL) tablet 1,000 mg (1,000 mg Oral Given 02/18/23 1405)  sodium chloride 0.9 % bolus 1,000 mL (0 mLs Intravenous Stopped 02/18/23 1531)  ceFEPIme (MAXIPIME) 2 g in sodium chloride 0.9 % 100 mL IVPB (0 g Intravenous Stopped 02/18/23 1446)  metroNIDAZOLE (FLAGYL) IVPB 500 mg (0 mg Intravenous Stopped 02/18/23 1531)    ED Course/ Medical Decision Making/ A&P Clinical Course as of 02/18/23 1534  Tue Feb 18, 2023  1523 Sepsis, hx of CHF, fluid overloaded. Poss UTI vs PNA [BH]    Clinical Course User Index [BH] Henderly, Britni A, PA-C                                 Medical Decision Making Patient is a 62 year old male, here sent by cardiology gait given febrile, tachycardic, and short of breath.  He is really well-appearing on exam, just tachypneic mildly, and mildly tachycardic.  He has a fever of 103 F.  Denies any kind of shortness of breath, cough, sore throat, runny nose, that is new, he states he has a chronic cough, and has been feeling well recently.  Amount and/or Complexity of Data Reviewed Labs:     Details: No leukocytosis, no evidence of AKI, hypercarbic Discussion of management or test interpretation with external provider(s):  Discussed with [place] PA, she will follow-up on exam, he is tachypneic, febrile, and tachycardic on eval, thus sepsis criteria was initiated.  Unknown source, as his lungs appear to be coarse, but I was suspicious that this may be secondary to fluid overload.  Only given 500 mL of fluid, given concern for fluid overload, but also with sepsis criteria initiated.  She will follow-up on urinalysis, blood work, for further evaluation, plan is for likely admission.  Risk Prescription drug management.    Final Clinical Impression(s) / ED Diagnoses Final diagnoses:  None    Rx / DC Orders ED Discharge Orders     None         Yenesis Even, Harley [place], PA 02/18/23 1534    Anders Simmonds T, DO 02/19/23 [phone]

## 2023-02-18 NOTE — Progress Notes (Signed)
ED Pharmacy Antibiotic Sign Off An antibiotic consult was received from an ED provider for vancomycin and cefepime per pharmacy dosing for sepsis. A chart review was completed to assess appropriateness.   The following one time order(s) were placed:  Vancomycin 2g IV x 1 Cefepime 2g IV x 1  Further antibiotic and/or antibiotic pharmacy consults should be ordered by the admitting provider if indicated.   Thank you for allowing pharmacy to be a part of this patient's care.   Daylene Posey, [facility]  Clinical Pharmacist 02/18/23 2:05 PM

## 2023-02-18 NOTE — ED Triage Notes (Signed)
Pt BIB EMS from heart care and had SHOB. 85% on 3L. Tachy in 115 and in afib. Axox4 Hx of CHF and COPD.

## 2023-02-18 NOTE — Sepsis Progress Note (Signed)
Sepsis protocol monitored by eLink ?

## 2023-02-18 NOTE — Hospital Course (Addendum)
Summary: [name] is a 62 y/o with past medical history of chronic hypoxic respiratory failure secondary to COPD on 3L at baseline, obstructive uropathy s/p right retrograde pyelogram, and permanent atrial fibrillation who presented to the ED from his cardiologist's office due to worsening shortness of breath. He was admitted to the Internal Medicine Teaching Service on 12/3 for acute HF exacerbation and pyelonephritis.   Pyelonephritis  Nephrolithiasis Pt febrile to 103 F on day of admission. Given broad spectrum antibiotics in ED 2/2 concerns for sepsis in the emergency department. UA with 21-50 WBC and rare bacteria. History of nephrolithiasis with recent cystoscopy with laser for removal of right ureteral pelvic junction stone. Admission CT with non-obstructing stone bilaterally measuring up to 5 mm. Patient remained afebrile for 48 hours, denied dysuria or hematuria on day of discharge. Stopped vancomycin, cefepime, and flagyl (12/3), started IV ceftriaxone/Rocephin 12/4. Will give 4 days of ciprofloxacin 500 mg BID at discharge.    Acute on Chronic Hypoxic Respiratory Failure  Multifactorial w/ history of COPD and HF Presented with worsening shortness of breath and increased oxygen requirement (needed 5-6 L of O2 when baseline is 3 L). Showed signs of volume overload with lower extremity edema, evidence of weight gain, pulmonary vascular congestion, and elevated BNP. Recent echo May 2024 showed LVEF 55-60%, normal LV and RV function with moderate LVH with no significant change from prior echo in 2021. Home medications include metolazone 2.5 mg (takes only on Wednesday), PO lasix 80 mg BID, and Farxiga 10 mg daily. Unclear if patient has been taking his PO lasix as indicated at home, endorsed 40 mg BID to admitting team then 80 mg BID to day team. Fill history indicates he may have been without lasix for a few weeks. Patient was given IV lasix 20 mg x 1 dose and PO metolazone 2.5 mg in ED  with net output -2.6 L, given 2 days of IV lasix 20 mg BID.   Patient endorsed improvement in symptoms, lung exam without crackles, intermittent coughing, and net output -12L since admission. Baseline dry weight seems to be around 300 lbs, with patient weighing 304 lb on day of discharge. Discussed options to continue diuresing at home with PO diuretics given his overall improvement in clinical presentation or continuing to stay in hospital for diuresis and patient selected to go home. Discharged on PO torsemide 20 mg daily and metolazone 2.5 mg on Wednesdays.    Patient's inhalers include albuterol TID, Anoro Ellipta daily, and Flovent daily. Endorsed some coughing but no increased sputum. Continued albuterol inhaler inpatient and patient given robutussin PRN and tessalon perles BID for intermittent cough. Discharged on home inhalers.   Hypokalemia Patient with persistently low K (3-3.2) during admission likely due to diuresis as Mg normal. Changed home regimen of K to potassium chloride 40 mEq total daily. Instructed patient to get BMP (ordered) at [facility] lab visit next week. Recommend outpatient monitoring.      New Lung Nodule  New nodule noted on admission CT scan, 9 x 6 mm subsolid right upper lobe. Favored to be more infectious/inflammatory. Recommended follow up CT in 3-6 months.     Enlarged Ascending Thoracic Aorta  Incidental finding on admission CT chest abdomen. Ascending thoracic aorta measures 4.1 cm, recommended annual imaging follow-up by CTA or MRA.    Atrial Fibrillation Underwent direct current cardioversion (DCCV) July 2017 and placed on Xarelto 20 mg daily. Patient is on diltiazem 240 mg BID for rate control. Resumed on admission.

## 2023-02-18 NOTE — ED Notes (Signed)
Pt. Given [place] sandwich and juice per request

## 2023-02-18 NOTE — ED Notes (Signed)
Pt seen for evaluation of bipap necessity. Upon arrival to room, pt in no acute distress and able to talk in complete sentences. Pt voiced concern that nasal cannula not working properly. Nasal cannula changed out for a new one at this time. Pt increased from 3L to 5L for comfort. Clear/diminished bilateral breath sounds heard at this time. Pt endorses cough with mild clear/white sputum production. RT educated pt to let staff know if he feels his breathing is worsening. Pt verbalizes understanding at this time. RT will continue to monitor and be available as needed. RN updated as well.

## 2023-02-18 NOTE — H&P (Cosign Needed)
Date: 02/18/2023               Patient Name:  Frank Morales MRN: [id number]  DOB: [birth year] Age / Sex: 62 y.o., male   PCP: Olegario Messier, MD         Medical Service: Internal Medicine Teaching Service         Attending Physician: Dr. Mercie Eon, MD      First Contact: Dr. Philomena Doheny, MD Pager [phone]    Second Contact: Dr. Rocky Morel, DO Pager [phone]         After Hours (After 5p/  First Contact Pager: [phone]  weekends / holidays): Second Contact Pager: [phone]   SUBJECTIVE   Chief Complaint: Shortness of breath  History of Present Illness:   [name] is a 62 year old male with past medical history of chronic hypoxic respiratory failure secondary to COPD on 3L at baseline, obstructive uropathy s/p right retrograde pyelogram, and permanent atrial fibrillation who presents from his cardiologist office with worsening shortness of breath. Per chart review of cardiologist note today, his breathing was very labored and exam was consistent with volume overload. Was also found to be febrile. His weight has increased from 285 pounds on 9/26 to 322 pounds on 12/3.   Per patient, his shortness of breath started about 2-3 days ago with increased coughs and sinus issues. He states his sinuses get much worse during the winter extreme temperatures. He denies any sick contacts, and has been taking his Lasix regularly as well as his metolazone once a week (on Wednesday). He feels like isn't urinating as much anymore, as well as endorses some intermittent chills and subjective fevers.  He denies any nausea, vomiting, or abdominal pain   ED Course: Code sepsis called due to fever and increased oxygen requirements, started on broad-spectrum antibiotics, and IMTS consulted for admission.   Meds:  Lasix 80mg  BID Anoro Ellipta 62. Farxiga 10mg   Diltiazem 240mg  BID Metolazone 2.5mg  Takes only on Wednesday  Protonix 20mg   Xarelto 20mg    Past Medical  History Chronic hypoxic respiratory failure secondary to COPD on 3L at baseline Chronic Diastolic Heart Failure Obstructive uropathy s/p right retrograde pyelogram Permanent atrial fibrillation on DOAC Hypertension  Past Surgical History:  Procedure Laterality Date   CARDIAC CATHETERIZATION N/A 08/21/2015   Procedure: Right/Left Heart Cath and Coronary Angiography;  Surgeon: Marykay Lex, MD;  Location: [facility] INVASIVE CV LAB;  Service: Cardiovascular;  Laterality: N/A;   CARDIOVERSION N/A 09/22/2015   Procedure: CARDIOVERSION;  Surgeon: Wendall Stade, MD;  Location: [facility] ENDOSCOPY;  Service: Cardiovascular;  Laterality: N/A;   COLONOSCOPY N/A 07/19/2016   Procedure: COLONOSCOPY;  Surgeon: Sherrilyn Rist, MD;  Location: [facility] ENDOSCOPY;  Service: Gastroenterology;  Laterality: N/A;   CYSTOSCOPY WITH URETEROSCOPY AND STENT PLACEMENT Right 10/01/2022   Procedure: CYSTOSCOPY WITH URETEROSCOPY AND STENT PLACEMENT;  Surgeon: Crista Elliot, MD;  Location: [facility] OR;  Service: Urology;  Laterality: Right;   CYSTOSCOPY/URETEROSCOPY/HOLMIUM LASER/STENT PLACEMENT Right 11/25/2022   Procedure: CYSTOSCOPY RIGHT RETROGRADE PYELOGRAM RIGHT URETEROSCOPY/HOLMIUM LASER/STENT EXCHANGE;  Surgeon: Crista Elliot, MD;  Location: [facility] ORS;  Service: Urology;  Laterality: Right;  60 MINS FOR CASE   ESOPHAGOGASTRODUODENOSCOPY N/A 07/19/2016   Procedure: ESOPHAGOGASTRODUODENOSCOPY (EGD);  Surgeon: Sherrilyn Rist, MD;  Location: [facility] ENDOSCOPY;  Service: Gastroenterology;  Laterality: N/A;   IR THORACENTESIS ASP PLEURAL SPACE W/IMG GUIDE  07/09/2019   SINUS ENDO WITH FUSION N/A 10/02/2018   Procedure:  SINUS ENDO WITH FUSION;  Surgeon: Suzanna Obey, MD;  Location: [facility] OR;  Service: ENT;  Laterality: N/A;    Social:  Lives With: Cousin Occupation: Currently unemployed Support: Cousin and friends in the area Level of Function: Able to perform all ADLS/IADLs independently when not acutely ill  PCP: Dr. Jason Fila  Davyn Elsasser, MD Substances: Half pack a day over 20 years. Occasional alcohol use (two beers socially), no marijuana   Family History:   Diabetes in mom  Aunts on mom side have diabetes   Allergies: Allergies as of 02/18/2023 - Review Complete 02/18/2023  Allergen Reaction Noted   Lactose intolerance (gi) Nausea And Vomiting 07/16/2016    Review of Systems: A complete ROS was negative except as per HPI.   OBJECTIVE:   Physical Exam: Blood pressure 105/63, pulse (!) 129, temperature (!) 100.4 F (38 C), temperature source Oral, resp. rate (!) 25, height 5\' 11"  (1.803 m), weight 99.8 kg, SpO2 100%.  Constitutional: Male, in bed, in no acute distress HENT: normocephalic atraumatic, mucous membranes dry Eyes: conjunctiva non-erythematous Neck: supple Cardiovascular: Irregular irregular rate and rhythm, no m/r/g, +2 pitting edema of lower extremities bilaterally Pulmonary/Chest: Mild expiratory wheezes throughout lung fields Abdominal: soft, non-tender, mildly distended MSK: normal bulk and tone Neurological: alert & oriented x 3, 5/5 strength in bilateral upper and lower extremities, normal gait Skin: warm and dry Psych: normal mood and affect  Labs: CBC    Component Value Date/Time   WBC 7.2 02/18/2023 1357   RBC 3.51 (L) 02/18/2023 1357   HGB 10.9 (L) 02/18/2023 1428   HGB 10.9 (L) 02/18/2023 1428   HGB 11.1 (L) 07/25/2022 1038   HCT 32.0 (L) 02/18/2023 1428   HCT 32.0 (L) 02/18/2023 1428   HCT 35.7 (L) 07/25/2022 1038   PLT 271 02/18/2023 1357   PLT 307 07/25/2022 1038   MCV 83.5 02/18/2023 1357   MCV 79 07/25/2022 1038   MCH 25.9 (L) 02/18/2023 1357   MCHC 31.1 02/18/2023 1357   RDW 23.5 (H) 02/18/2023 1357   RDW 20.6 (H) 07/25/2022 1038   LYMPHSABS 0.9 02/18/2023 1357   LYMPHSABS 1.1 07/16/2022 1140   MONOABS 1.2 (H) 02/18/2023 1357   EOSABS 0.0 02/18/2023 1357   EOSABS 0.2 07/16/2022 1140   BASOSABS 0.1 02/18/2023 1357   BASOSABS 0.0 07/16/2022 1140      CMP     Component Value Date/Time   NA 136 02/18/2023 1428   NA 136 02/18/2023 1428   NA 138 02/06/2023 1110   K 4.5 02/18/2023 1428   K 4.5 02/18/2023 1428   CL 92 (L) 02/18/2023 1428   CO2 32 02/18/2023 1357   GLUCOSE 100 (H) 02/18/2023 1428   BUN 14 02/18/2023 1428   BUN 19 02/06/2023 1110   CREATININE 0.90 02/18/2023 1428   CREATININE 0.79 02/26/2016 1634   CALCIUM 8.7 (L) 02/18/2023 1357   PROT 8.2 (H) 02/18/2023 1357   ALBUMIN 3.4 (L) 02/18/2023 1357   AST 18 02/18/2023 1357   ALT 13 02/18/2023 1357   ALKPHOS 56 02/18/2023 1357   BILITOT 0.7 02/18/2023 1357   GFRNONAA >60 02/18/2023 1357   GFRAA 120 02/14/2020 1104    Imaging:  Chest X-Ray: Cardiomegaly with pulmonary vascular congestion and small R>L Pleural Effusion   CT Chest/Abdomen/Pelvis:   - New lung nodule in RUL, infectious/inflammatory  - Multichamber cardiomegaly  - Chronic small right pleural effusion  - Multi station abdominopelvic lymphadenopathy with increased size of bilateral pelvic lymph nodes, nonspecific  -  Bilateral non obstructing renal stones measuring up to 5mm, asymmetric right perinephric stranding similar to pyelonephritis appearance  EKG: personally reviewed my interpretation is irregular rate and rhtyhm, no axis deviation, similar to prior EKGs.   ASSESSMENT & PLAN:   Assessment & Plan by Problem: Active Problems:   Acute exacerbation of CHF (congestive heart failure) (HCC)   DYSON ORNELLAS is a 62 y.o. person living with a history of CHF, atrial fibrillation, nephrolithiasis who presented with worsening shortness of breath and admitted for HF exacerbation on hospital day 0  #Acute on Chronic Hypoxic Respiratory Failure  #Multifactorial w/ COPD and HF Pt presents with worsening shortness of breath (needed 5-6L of oxygen during early ED visit when baseline is 3L) and signs of volume overload with lower extremity edema, and objective evidence of weight gain, pulmonary vascular  congestion, as well as elevated BNP. At most recent clinic visit on 02/06/23 his lasix was increased form 40mg  to 80mg , however he has only been taking 40mg  twice a day.   Etiology may be due to underlying infection (problem below).  He has received 20mg  of IV Lasix so far, holding off on aggressive diuresis given systolic blood pressure in the low 100s. We were able to decrease his oxygen to 1L in the room and he was saturating at 98%, so question use of his baseline 3L of oxygen. Very mild expiratory wheezes on exam, will hold off on breathing treatment for now.   Plan:  - Daily Weights  - Strict I/Os - Questionable needing to continue diuresis given he is currently below his oxygen requirements   #Pyelonephritis  #Nephrolithiasis Pt febrile to 103, then to 100.4, and most recently 98.7. He was given broad spectrum antibiotics for worry of sepsis in the emergency department with Cefepime, Flagyl, and Vancomycin. His urinanalysis shows Large amounts of bacteria, 21-50 WBC, and rare bacteria. He does have a history of nephrolithiasis, and recentlyu underwent cystoscopy with laser for removal of right ureteral pelvic junction stone. Will start rocephin tomorrow for treatment.   CT does show non-obstructing stone bilaterally measuring up to 5mm.   Plan:  - Rocephin (Day 1, antibiotic day 2)  #New Lung Nodule  New nodule noted on CT scan, 9x56mm subsolid right upper lobe. Favored to be more infectious/inflammatory. Recommended follow up in 3-6 months.   #Atrial Fibrillation On Xarelto for anti-coagulation, and diltiazem for rate control. Will continue.   #Enlarged Ascending Thoracic Aorta  Measures 4.1cm, recommended follow up annually.   Diet: Heart Healthy VTE: DOAC IVF: None Code: Full  Prior to Admission Living Arrangement: Home, living with cousin Anticipated Discharge Location: Home Barriers to Discharge: Medical Management  Dispo: Admit patient to Observation with expected  length of stay less than 2 midnights.  SignedOlegario Messier, MD Internal Medicine Resident PGY-2  02/18/2023, 9:08 PM

## 2023-02-19 DIAGNOSIS — R911 Solitary pulmonary nodule: Secondary | ICD-10-CM | POA: Diagnosis not present

## 2023-02-19 DIAGNOSIS — N12 Tubulo-interstitial nephritis, not specified as acute or chronic: Secondary | ICD-10-CM | POA: Diagnosis not present

## 2023-02-19 DIAGNOSIS — A419 Sepsis, unspecified organism: Secondary | ICD-10-CM | POA: Diagnosis not present

## 2023-02-19 DIAGNOSIS — Z1152 Encounter for screening for COVID-19: Secondary | ICD-10-CM | POA: Diagnosis not present

## 2023-02-19 DIAGNOSIS — Z87442 Personal history of urinary calculi: Secondary | ICD-10-CM | POA: Diagnosis not present

## 2023-02-19 DIAGNOSIS — Z7984 Long term (current) use of oral hypoglycemic drugs: Secondary | ICD-10-CM | POA: Diagnosis not present

## 2023-02-19 DIAGNOSIS — J9621 Acute and chronic respiratory failure with hypoxia: Secondary | ICD-10-CM

## 2023-02-19 DIAGNOSIS — I4821 Permanent atrial fibrillation: Secondary | ICD-10-CM | POA: Diagnosis not present

## 2023-02-19 DIAGNOSIS — J439 Emphysema, unspecified: Secondary | ICD-10-CM | POA: Diagnosis not present

## 2023-02-19 DIAGNOSIS — R509 Fever, unspecified: Secondary | ICD-10-CM | POA: Diagnosis not present

## 2023-02-19 DIAGNOSIS — E739 Lactose intolerance, unspecified: Secondary | ICD-10-CM | POA: Diagnosis not present

## 2023-02-19 DIAGNOSIS — N3000 Acute cystitis without hematuria: Secondary | ICD-10-CM | POA: Diagnosis not present

## 2023-02-19 DIAGNOSIS — R6520 Severe sepsis without septic shock: Secondary | ICD-10-CM | POA: Diagnosis not present

## 2023-02-19 DIAGNOSIS — E876 Hypokalemia: Secondary | ICD-10-CM | POA: Diagnosis not present

## 2023-02-19 DIAGNOSIS — Z6830 Body mass index (BMI) 30.0-30.9, adult: Secondary | ICD-10-CM | POA: Diagnosis not present

## 2023-02-19 DIAGNOSIS — Z7901 Long term (current) use of anticoagulants: Secondary | ICD-10-CM | POA: Diagnosis not present

## 2023-02-19 DIAGNOSIS — Z7951 Long term (current) use of inhaled steroids: Secondary | ICD-10-CM | POA: Diagnosis not present

## 2023-02-19 DIAGNOSIS — Z560 Unemployment, unspecified: Secondary | ICD-10-CM | POA: Diagnosis not present

## 2023-02-19 DIAGNOSIS — J449 Chronic obstructive pulmonary disease, unspecified: Secondary | ICD-10-CM | POA: Diagnosis not present

## 2023-02-19 DIAGNOSIS — R591 Generalized enlarged lymph nodes: Secondary | ICD-10-CM | POA: Diagnosis not present

## 2023-02-19 DIAGNOSIS — J90 Pleural effusion, not elsewhere classified: Secondary | ICD-10-CM | POA: Diagnosis not present

## 2023-02-19 DIAGNOSIS — I5033 Acute on chronic diastolic (congestive) heart failure: Secondary | ICD-10-CM | POA: Diagnosis not present

## 2023-02-19 DIAGNOSIS — I509 Heart failure, unspecified: Secondary | ICD-10-CM | POA: Diagnosis not present

## 2023-02-19 DIAGNOSIS — R0602 Shortness of breath: Secondary | ICD-10-CM | POA: Diagnosis not present

## 2023-02-19 DIAGNOSIS — I110 Hypertensive heart disease with heart failure: Secondary | ICD-10-CM | POA: Diagnosis not present

## 2023-02-19 DIAGNOSIS — I5032 Chronic diastolic (congestive) heart failure: Secondary | ICD-10-CM | POA: Diagnosis not present

## 2023-02-19 DIAGNOSIS — Z79899 Other long term (current) drug therapy: Secondary | ICD-10-CM | POA: Diagnosis not present

## 2023-02-19 DIAGNOSIS — E669 Obesity, unspecified: Secondary | ICD-10-CM | POA: Diagnosis not present

## 2023-02-19 DIAGNOSIS — R000 Tachycardia, unspecified: Secondary | ICD-10-CM | POA: Diagnosis not present

## 2023-02-19 DIAGNOSIS — N200 Calculus of kidney: Secondary | ICD-10-CM | POA: Diagnosis not present

## 2023-02-19 LAB — BASIC METABOLIC PANEL
Anion gap: 6 (ref 5–15)
BUN: 13 mg/dL (ref 8–23)
CO2: 32 mmol/L (ref 22–32)
Calcium: 8.1 mg/dL — ABNORMAL LOW (ref 8.9–10.3)
Chloride: 94 mmol/L — ABNORMAL LOW (ref 98–111)
Creatinine, Ser: 0.79 mg/dL (ref 0.61–1.24)
GFR, Estimated: >60 mL/min (ref >60)
Glucose, Bld: 104 mg/dL — ABNORMAL HIGH (ref 70–99)
Potassium: 3.1 mmol/L — ABNORMAL LOW (ref 3.5–5.1)
Sodium: 132 mmol/L — ABNORMAL LOW (ref 135–145)

## 2023-02-19 LAB — CBC
HCT: 27.1 % — ABNORMAL LOW (ref 39.0–52.0)
Hemoglobin: 8.6 g/dL — ABNORMAL LOW (ref 13.0–17.0)
MCH: 26.5 pg (ref 26.0–34.0)
MCHC: 31.7 g/dL (ref 30.0–36.0)
MCV: 83.6 fL (ref 80.0–100.0)
Platelets: 211 10*3/uL (ref 150–400)
RBC: 3.24 MIL/uL — ABNORMAL LOW (ref 4.22–5.81)
RDW: 24 % — ABNORMAL HIGH (ref 11.5–15.5)
WBC: 4.4 10*3/uL (ref 4.0–10.5)
nRBC: 0 % (ref 0.0–0.2)

## 2023-02-19 LAB — URINE CULTURE

## 2023-02-19 MED ORDER — POTASSIUM CHLORIDE CRYS ER 20 MEQ PO TBCR
40.0000 meq | EXTENDED_RELEASE_TABLET | Freq: Once | ORAL | Status: AC
  Administered 2023-02-19: 40 meq via ORAL
  Filled 2023-02-19: qty 2

## 2023-02-19 MED ORDER — FLUTICASONE PROPIONATE 50 MCG/ACT NA SUSP
1.0000 | Freq: Every day | NASAL | Status: DC | PRN
  Administered 2023-02-21: 1 via NASAL
  Filled 2023-02-19: qty 16

## 2023-02-19 MED ORDER — FUROSEMIDE 10 MG/ML IJ SOLN
20.0000 mg | Freq: Once | INTRAMUSCULAR | Status: AC
  Administered 2023-02-19: 20 mg via INTRAVENOUS
  Filled 2023-02-19: qty 2

## 2023-02-19 MED ORDER — POTASSIUM CHLORIDE CRYS ER 20 MEQ PO TBCR
40.0000 meq | EXTENDED_RELEASE_TABLET | Freq: Every day | ORAL | Status: DC
  Administered 2023-02-19: 40 meq via ORAL
  Filled 2023-02-19: qty 2

## 2023-02-19 MED ORDER — FUROSEMIDE 10 MG/ML IJ SOLN
20.0000 mg | Freq: Two times a day (BID) | INTRAMUSCULAR | Status: DC
  Administered 2023-02-19 – 2023-02-20 (×3): 20 mg via INTRAVENOUS
  Filled 2023-02-19 (×3): qty 2

## 2023-02-19 NOTE — ED Notes (Signed)
Pt. Bedding soiled. Sheets changed, new gown and chux placed. Urinal at bedside

## 2023-02-19 NOTE — ED Notes (Signed)
ED TO INPATIENT HANDOFF REPORT  ED Nurse Name and Phone #: Rodney Booze [phone]  S Name/Age/Gender Frank Morales 62 y.o. male Room/Bed: 045C/045C  Code Status   Code Status: Full Code  Home/SNF/Other Home Patient oriented to: self, place, time, and situation Is this baseline? Yes   Triage Complete: Triage complete  Chief Complaint Acute exacerbation of CHF (congestive heart failure) (HCC) [I50.9]  Triage Note Pt BIB EMS from heart care and had SHOB. 85% on 3L. Tachy in 115 and in afib. Axox4 Hx of CHF and COPD.    Allergies Allergies  Allergen Reactions   Lactose Intolerance (Gi) Nausea And Vomiting    Level of Care/Admitting Diagnosis ED Disposition     ED Disposition  Admit   Condition  --   Comment  Hospital Area: [facility] [[id number]]  Level of Care: Telemetry Medical [[id number]]  May admit patient to [facility] or [facility] if equivalent level of care is available:: No  Covid Evaluation: Asymptomatic - no recent exposure (last 10 days) testing not required  Diagnosis: Acute exacerbation of CHF (congestive heart failure) [facility]) [[id number]]  Admitting Physician: Mercie Eon [[id number]]  Attending Physician: Mercie Eon [[id number]]  Certification:: I certify this patient will need inpatient services for at least 2 midnights  Expected Medical Readiness: 02/20/2023          B Medical/Surgery History Past Medical History:  Diagnosis Date   Acute exacerbation of CHF (congestive heart failure) (HCC) 12/15/2019   Acute heart failure (HCC) 12/16/2019   Anemia    Angio-edema    Arthritis    Atrial fibrillation and flutter (HCC) 08/17/2015   A. S/p failed DCCV // b. Severe BAE on echo >> rate control strategy (has seen AF clinic) // c. Xarelto for anticoag (CHADS2-VASc=2 / CHF, HTN)   Chronic diastolic CHF (congestive heart failure) (HCC) 08/30/2015   A. Echo 6/17: Apical HK, moderate focal basal and mild concentric LVH, EF 50-55, diffuse HK, trivial  MR, severe BAE, mild TR, PASP 37   COPD (chronic obstructive pulmonary disease) (HCC)    Dependence on continuous supplemental oxygen    3L   Dyspnea    Dysrhythmia    Afib   Hematuria 01/10/2021   Hepatic fibrosis 02/29/2016   F3/4 on elastography.    Hepatitis C, chronic (HCC) 08/19/2015   Hepatitis C, chronic (HCC) 08/19/2015   Treated in 2017 with SVR   History of cardiac catheterization    a. LHC 6/17: LAD irregs, o/w no CAD   History of kidney stones    Hypertension    Hyposmia 12/22/2017   OSA (obstructive sleep apnea) 11/21/2015   No Cpap   Oxygen dependent    Pleural effusion on right 03/14/2018   Pre-diabetes    Sleep apnea    Tobacco abuse    Past Surgical History:  Procedure Laterality Date   CARDIAC CATHETERIZATION N/A 08/21/2015   Procedure: Right/Left Heart Cath and Coronary Angiography;  Surgeon: Marykay Lex, MD;  Location: [facility] INVASIVE CV LAB;  Service: Cardiovascular;  Laterality: N/A;   CARDIOVERSION N/A 09/22/2015   Procedure: CARDIOVERSION;  Surgeon: Wendall Stade, MD;  Location: [facility] ENDOSCOPY;  Service: Cardiovascular;  Laterality: N/A;   COLONOSCOPY N/A 07/19/2016   Procedure: COLONOSCOPY;  Surgeon: Sherrilyn Rist, MD;  Location: [facility] ENDOSCOPY;  Service: Gastroenterology;  Laterality: N/A;   CYSTOSCOPY WITH URETEROSCOPY AND STENT PLACEMENT Right 10/01/2022   Procedure: CYSTOSCOPY WITH URETEROSCOPY AND STENT PLACEMENT;  Surgeon: Ray Church  III, MD;  Location: [facility] OR;  Service: Urology;  Laterality: Right;   CYSTOSCOPY/URETEROSCOPY/HOLMIUM LASER/STENT PLACEMENT Right 11/25/2022   Procedure: CYSTOSCOPY RIGHT RETROGRADE PYELOGRAM RIGHT URETEROSCOPY/HOLMIUM LASER/STENT EXCHANGE;  Surgeon: Crista Elliot, MD;  Location: [facility] ORS;  Service: Urology;  Laterality: Right;  60 MINS FOR CASE   ESOPHAGOGASTRODUODENOSCOPY N/A 07/19/2016   Procedure: ESOPHAGOGASTRODUODENOSCOPY (EGD);  Surgeon: Sherrilyn Rist, MD;  Location: [facility] ENDOSCOPY;  Service:  Gastroenterology;  Laterality: N/A;   IR THORACENTESIS ASP PLEURAL SPACE W/IMG GUIDE  07/09/2019   SINUS ENDO WITH FUSION N/A 10/02/2018   Procedure: SINUS ENDO WITH FUSION;  Surgeon: Suzanna Obey, MD;  Location: [facility] OR;  Service: ENT;  Laterality: N/A;     A IV Location/Drains/Wounds Patient Lines/Drains/Airways Status     Active Line/Drains/Airways     Name Placement date Placement time Site Days   Peripheral IV 02/18/23 20 G Anterior;Right Forearm 02/18/23  1357  Forearm  1   Peripheral IV 02/18/23 18 G Anterior;Distal;Right;Upper Arm 02/18/23  1412  Arm  1   Ureteral Drain/Stent Right ureter 6 Fr. 11/25/22  --  Right ureter  86            Intake/Output Last 24 hours  Intake/Output Summary (Last 24 hours) at 02/19/2023 1700 Last data filed at 02/19/2023 1503 Gross per 24 hour  Intake 580 ml  Output 6600 ml  Net -6020 ml    Labs/Imaging Results for orders placed or performed during the hospital encounter of 02/18/23 (from the past 48 hour(s))  Culture, blood (routine x 2)     Status: None (Preliminary result)   Collection Time: 02/18/23  1:02 PM   Specimen: BLOOD  Result Value Ref Range   Specimen Description BLOOD SITE NOT SPECIFIED    Special Requests      BOTTLES DRAWN AEROBIC AND ANAEROBIC Blood Culture adequate volume   Culture      NO GROWTH < 24 HOURS Performed at [facility], [address] [place], [place] [address]    Report Status PENDING   Resp panel by RT-PCR (RSV, Flu A&B, Covid) Anterior Nasal Swab     Status: None   Collection Time: 02/18/23  1:02 PM   Specimen: Anterior Nasal Swab  Result Value Ref Range   SARS Coronavirus 2 by RT PCR NEGATIVE NEGATIVE   Influenza A by PCR NEGATIVE NEGATIVE   Influenza B by PCR NEGATIVE NEGATIVE    Comment: (NOTE) The Xpert Xpress SARS-CoV-2/FLU/RSV plus assay is intended as an aid in the diagnosis of influenza from Nasopharyngeal swab specimens and should not be used as a sole basis for treatment. Nasal  washings and aspirates are unacceptable for Xpert Xpress SARS-CoV-2/FLU/RSV testing.  Fact Sheet for Patients: BloggerCourse.com  Fact Sheet for Healthcare Providers: SeriousBroker.it  This test is not yet approved or cleared by the [place] FDA and has been authorized for detection and/or diagnosis of SARS-CoV-2 by FDA under an Emergency Use Authorization (EUA). This EUA will remain in effect (meaning this test can be used) for the duration of the COVID-19 declaration under Section 564(b)(1) of the Act, 21 U.S.C. section 360bbb-3(b)(1), unless the authorization is terminated or revoked.     Resp Syncytial Virus by PCR NEGATIVE NEGATIVE    Comment: (NOTE) Fact Sheet for Patients: BloggerCourse.com  Fact Sheet for Healthcare Providers: SeriousBroker.it  This test is not yet approved or cleared by the [place] FDA and has been authorized for detection and/or diagnosis of SARS-CoV-2 by FDA  under an Emergency Use Authorization (EUA). This EUA will remain in effect (meaning this test can be used) for the duration of the COVID-19 declaration under Section 564(b)(1) of the Act, 21 U.S.C. section 360bbb-3(b)(1), unless the authorization is terminated or revoked.  Performed at [facility], [address] [place], [place] [address]   Culture, blood (routine x 2)     Status: None (Preliminary result)   Collection Time: 02/18/23  1:07 PM   Specimen: BLOOD  Result Value Ref Range   Specimen Description BLOOD SITE NOT SPECIFIED    Special Requests      BOTTLES DRAWN AEROBIC AND ANAEROBIC Blood Culture adequate volume   Culture      NO GROWTH < 24 HOURS Performed at [facility], [address] [place], [place] [address]    Report Status PENDING   CBC with Differential     Status: Abnormal   Collection Time: 02/18/23  1:57 PM  Result Value Ref Range   WBC  7.2 4.0 - 10.5 K/uL   RBC 3.51 (L) 4.22 - 5.81 MIL/uL   Hemoglobin 9.1 (L) 13.0 - 17.0 g/dL   HCT 09.8 (L) 11.9 - 14.7 %   MCV 83.5 80.0 - 100.0 fL   MCH 25.9 (L) 26.0 - 34.0 pg   MCHC 31.1 30.0 - 36.0 g/dL   RDW 82.9 (H) 56.2 - 13.0 %   Platelets 271 150 - 400 K/uL   nRBC 0.0 0.0 - 0.2 %   Neutrophils Relative % 70 %   Neutro Abs 5.0 1.7 - 7.7 K/uL   Lymphocytes Relative 12 %   Lymphs Abs 0.9 0.7 - 4.0 K/uL   Monocytes Relative 16 %   Monocytes Absolute 1.2 (H) 0.1 - 1.0 K/uL   Eosinophils Relative 1 %   Eosinophils Absolute 0.0 0.0 - 0.5 K/uL   Basophils Relative 1 %   Basophils Absolute 0.1 0.0 - 0.1 K/uL   Immature Granulocytes 0 %   Abs Immature Granulocytes 0.03 0.00 - 0.07 K/uL    Comment: Performed at [facility], [address], [place], [place] [address]  Troponin I (High Sensitivity)     Status: None   Collection Time: 02/18/23  1:57 PM  Result Value Ref Range   Troponin I (High Sensitivity) 9 <18 ng/L    Comment: (NOTE) Elevated high sensitivity troponin I (hsTnI) values and significant  changes across serial measurements may suggest ACS but many other  chronic and acute conditions are known to elevate hsTnI results.  Refer to the "Links" section for chest pain algorithms and additional  guidance. Performed at [facility], [address] [place], [place] [address]   Brain natriuretic peptide     Status: Abnormal   Collection Time: 02/18/23  1:57 PM  Result Value Ref Range   B Natriuretic Peptide 131.9 (H) 0.0 - 100.0 pg/mL    Comment: Performed at [facility], [address], [place], [place] [address]  Comprehensive metabolic panel     Status: Abnormal   Collection Time: 02/18/23  1:57 PM  Result Value Ref Range   Sodium 136 135 - 145 mmol/L   Potassium 4.4 3.5 - 5.1 mmol/L   Chloride 95 (L) 98 - 111 mmol/L   CO2 32 22 - 32 mmol/L   Glucose, Bld 94 70 - 99 mg/dL    Comment: Glucose reference range applies only to samples taken after  fasting for at least 8 hours.  BUN 12 8 - 23 mg/dL   Creatinine, Ser 4.33 0.61 - 1.24 mg/dL   Calcium 8.7 (L) 8.9 - 10.3 mg/dL   Total Protein 8.2 (H) 6.5 - 8.1 g/dL   Albumin 3.4 (L) 3.5 - 5.0 g/dL   AST 18 15 - 41 U/L   ALT 13 0 - 44 U/L   Alkaline Phosphatase 56 38 - 126 U/L   Total Bilirubin 0.7 <1.2 mg/dL   GFR, Estimated >29 >51 mL/min    Comment: (NOTE) Calculated using the CKD-EPI Creatinine Equation (2021)    Anion gap 9 5 - 15    Comment: Performed at [facility], [address], [place], [place] [address]  I-stat chem 8, ED     Status: Abnormal   Collection Time: 02/18/23  2:28 PM  Result Value Ref Range   Sodium 136 135 - 145 mmol/L   Potassium 4.5 3.5 - 5.1 mmol/L   Chloride 92 (L) 98 - 111 mmol/L   BUN 14 8 - 23 mg/dL   Creatinine, Ser 6.06 0.61 - 1.24 mg/dL   Glucose, Bld 301 (H) 70 - 99 mg/dL    Comment: Glucose reference range applies only to samples taken after fasting for at least 8 hours.   Calcium, Ion 1.12 (L) 1.15 - 1.40 mmol/L   TCO2 33 (H) 22 - 32 mmol/L   Hemoglobin 10.9 (L) 13.0 - 17.0 g/dL   HCT 60.1 (L) 09.3 - 23.5 %  I-Stat Lactic Acid     Status: None   Collection Time: 02/18/23  2:28 PM  Result Value Ref Range   Lactic Acid, Venous 1.3 0.5 - 1.9 mmol/L  I-Stat venous blood gas, ED     Status: Abnormal   Collection Time: 02/18/23  2:28 PM  Result Value Ref Range   pH, Ven 7.322 7.25 - 7.43   pCO2, Ven 73.1 (HH) 44 - 60 mmHg   pO2, Ven 57 (H) 32 - 45 mmHg   Bicarbonate 37.8 (H) 20.0 - 28.0 mmol/L   TCO2 40 (H) 22 - 32 mmol/L   O2 Saturation 85 %   Acid-Base Excess 9.0 (H) 0.0 - 2.0 mmol/L   Sodium 136 135 - 145 mmol/L   Potassium 4.5 3.5 - 5.1 mmol/L   Calcium, Ion 1.15 1.15 - 1.40 mmol/L   HCT 32.0 (L) 39.0 - 52.0 %   Hemoglobin 10.9 (L) 13.0 - 17.0 g/dL   Sample type VENOUS    Comment NOTIFIED PHYSICIAN   Urinalysis, w/ Reflex to Culture (Infection Suspected) -Urine, Clean Catch     Status: Abnormal   Collection Time:  02/18/23  2:47 PM  Result Value Ref Range   Specimen Source URINE, CLEAN CATCH    Color, Urine YELLOW YELLOW   APPearance CLEAR CLEAR   Specific Gravity, Urine 1.010 1.005 - 1.030   pH 7.0 5.0 - 8.0   Glucose, UA 150 (A) NEGATIVE mg/dL   Hgb urine dipstick MODERATE (A) NEGATIVE   Bilirubin Urine NEGATIVE NEGATIVE   Ketones, ur NEGATIVE NEGATIVE mg/dL   Protein, ur NEGATIVE NEGATIVE mg/dL   Nitrite NEGATIVE NEGATIVE   Leukocytes,Ua LARGE (A) NEGATIVE   RBC / HPF 0-5 0 - 5 RBC/hpf   WBC, UA 21-50 0 - 5 WBC/hpf    Comment:        Reflex urine culture not performed if WBC <=10, OR if Squamous epithelial cells >5. If Squamous epithelial cells >5 suggest recollection.    Bacteria, UA RARE (A) NONE SEEN  Squamous Epithelial / HPF 0-5 0 - 5 /HPF   Mucus PRESENT     Comment: Performed at [facility], [address] [place], [place] [address]  Troponin I (High Sensitivity)     Status: None   Collection Time: 02/18/23  2:47 PM  Result Value Ref Range   Troponin I (High Sensitivity) 9 <18 ng/L    Comment: (NOTE) Elevated high sensitivity troponin I (hsTnI) values and significant  changes across serial measurements may suggest ACS but many other  chronic and acute conditions are known to elevate hsTnI results.  Refer to the "Links" section for chest pain algorithms and additional  guidance. Performed at [facility], [address] [place], [place] [address]   Urine Culture     Status: None (Preliminary result)   Collection Time: 02/18/23  2:47 PM   Specimen: Urine, Clean Catch  Result Value Ref Range   Specimen Description URINE, CLEAN CATCH    Special Requests      NONE Reflexed from [id number] Performed at [facility], [address], [place], [place] [address]    Culture PENDING    Report Status PENDING   I-Stat Lactic Acid     Status: None   Collection Time: 02/18/23  2:53 PM  Result Value Ref Range   Lactic Acid, Venous 0.7 0.5 - 1.9 mmol/L  Basic  metabolic panel     Status: Abnormal   Collection Time: 02/19/23  4:58 AM  Result Value Ref Range   Sodium 132 (L) 135 - 145 mmol/L   Potassium 3.1 (L) 3.5 - 5.1 mmol/L   Chloride 94 (L) 98 - 111 mmol/L   CO2 32 22 - 32 mmol/L   Glucose, Bld 104 (H) 70 - 99 mg/dL    Comment: Glucose reference range applies only to samples taken after fasting for at least 8 hours.   BUN 13 8 - 23 mg/dL   Creatinine, Ser 9.56 0.61 - 1.24 mg/dL   Calcium 8.1 (L) 8.9 - 10.3 mg/dL   GFR, Estimated >21 >30 mL/min    Comment: (NOTE) Calculated using the CKD-EPI Creatinine Equation (2021)    Anion gap 6 5 - 15    Comment: Performed at [facility], [address] [place], [place] [address]  CBC     Status: Abnormal   Collection Time: 02/19/23  4:58 AM  Result Value Ref Range   WBC 4.4 4.0 - 10.5 K/uL   RBC 3.24 (L) 4.22 - 5.81 MIL/uL   Hemoglobin 8.6 (L) 13.0 - 17.0 g/dL   HCT 46.9 (L) 62.9 - 52.8 %   MCV 83.6 80.0 - 100.0 fL   MCH 26.5 26.0 - 34.0 pg   MCHC 31.7 30.0 - 36.0 g/dL   RDW 41.3 (H) 24.4 - 01.0 %   Platelets 211 150 - 400 K/uL   nRBC 0.0 0.0 - 0.2 %    Comment: Performed at [facility], [address] [place], [place] [address]   CT CHEST ABDOMEN PELVIS WO CONTRAST  Result Date: 02/18/2023 CLINICAL DATA:  Fever, tachycardia, and shortness of breath. Sepsis. EXAM: CT CHEST, ABDOMEN AND PELVIS WITHOUT CONTRAST TECHNIQUE: Multidetector CT imaging of the chest, abdomen and pelvis was performed following the standard protocol without IV contrast. RADIATION DOSE REDUCTION: This exam was performed according to the departmental dose-optimization program which includes automated exposure control, adjustment of the mA and/or kV according to patient size and/or use of iterative reconstruction  technique. COMPARISON:  Same day chest radiograph, CT chest dated 01/07/2023, CT abdomen and pelvis dated 10/01/2022 FINDINGS: CT CHEST FINDINGS Decreased sensitivity and specificity for detailed  findings due to motion artifact. Cardiovascular: Multichamber cardiomegaly. No significant pericardial fluid/thickening. Main pulmonary artery measures 3.6 cm, unchanged. Ascending aorta measures 4.1 cm. Mediastinum/Nodes: Imaged thyroid gland without nodules meeting criteria for imaging follow-up by size. Normal esophagus. Unchanged right paratracheal lymph node measures 11 mm (2:36). Lungs/Pleura: The central airways are patent. Unchanged round atelectasis in the right middle and lower lobes. Similar upper lung predominant paraseptal emphysema. Similar right lung volume loss. New 9 x 6 mm subsolid right upper lobe nodule (3:68) no pneumothorax. Unchanged small right pleural effusion with loculated component. Musculoskeletal: No acute or abnormal lytic or blastic osseous lesions. CT ABDOMEN PELVIS FINDINGS Hepatobiliary: No focal hepatic lesions. No intra or extrahepatic biliary ductal dilation. Normal gallbladder. Pancreas: No focal lesions or main ductal dilation. Spleen: Normal in size without focal abnormality. Adrenals/Urinary Tract: No adrenal nodules. No suspicious renal mass on this noncontrast enhanced examination or hydronephrosis. Bilateral nonobstructing renal stones measuring up to 5 mm. Asymmetric right perinephric stranding, likely sequela prior infection/inflammation. No focal bladder wall thickening. Stomach/Bowel: Normal appearance of the stomach. No evidence of bowel wall thickening, distention, or inflammatory changes. Normal appendix. Vascular/Lymphatic: Aortic atherosclerosis. Unchanged 11 mm pericaval lymph node (2:86) and 11 mm aortocaval lymph node (2:87). Increased size of bilateral pelvic lymph nodes, for example 12 mm right common iliac lymph node (2:112), previously 4 mm, and 13 mm left external iliac lymph node (2:119), previously 11 mm. Reproductive: Prostate is unremarkable. Other: No free fluid, fluid collection, or free air. Musculoskeletal: No acute or abnormal lytic or blastic  osseous findings. Multilevel degenerative changes of the lumbar spine. IMPRESSION: Decreased sensitivity and specificity for detailed findings due to motion artifact. CHEST: 1. New 9 x 6 mm subsolid right upper lobe nodule, likely infectious/inflammatory. Follow-up non-contrast CT recommended at 3-6 months to confirm persistence. If unchanged, and solid component remains <6 mm, annual CT is recommended until 5 years of stability has been established. If persistent these nodules should be considered highly suspicious if the solid component of the nodule is 6 mm or greater in size and enlarging. This recommendation follows the consensus statement: Guidelines for Management of Incidental Pulmonary Nodules Detected on CT Images: From the Fleischner Society 2017; Radiology 2017; 284:228-243. 2. Unchanged chronic small right pleural effusion with loculated component and round atelectasis in the right middle and lower lobes. 3. Unchanged enlarged main pulmonary artery, which can be seen in the setting of pulmonary hypertension. 4. Multichamber cardiomegaly. Ascending thoracic aorta measures 4.1 cm. Recommend annual imaging followup by CTA or MRA. This recommendation follows 2010 ACCF/AHA/AATS/ACR/ASA/SCA/SCAI/SIR/STS/SVM Guidelines for the Diagnosis and Management of Patients with Thoracic Aortic Disease. Circulation. 2010; 121: W098-J191. Aortic aneurysm NOS (ICD10-I71.9) ABDOMEN/PELVIS: 1. Multi station abdominopelvic lymphadenopathy with increased size of bilateral pelvic lymph nodes, nonspecific. Recommend correlation with laboratory findings of lymphoproliferative process. 2. Bilateral nonobstructing renal stones measuring up to 5 mm. Asymmetric right perinephric stranding, likely sequela of prior infection/inflammation. Pyelonephritis can have a similar appearance. Aortic Atherosclerosis (ICD10-I70.0) and Emphysema (ICD10-J43.9). Electronically Signed   By: Agustin Cree M.D.   On: 02/18/2023 19:37   DG Chest 1  View  Result Date: 02/18/2023 CLINICAL DATA:  Shortness of breath.  History of CHF and COPD. EXAM: CHEST  1 VIEW COMPARISON:  Chest radiograph dated October 01, 2022. FINDINGS: Stable cardiomegaly. Pulmonary vascular congestion with small right-greater-than-left  pleural effusions. No pneumothorax. No acute osseous abnormality. IMPRESSION: Cardiomegaly with pulmonary vascular congestion and small right-greater-than-left pleural effusions. Electronically Signed   By: Hart Robinsons M.D.   On: 02/18/2023 14:15    Pending Labs Unresulted Labs (From admission, onward)     Start     Ordered   02/20/23 0500  CBC  Tomorrow morning,   R        02/19/23 1342   02/20/23 0500  Basic metabolic panel  Tomorrow morning,   R        02/19/23 1342            Vitals/Pain Today's Vitals   02/19/23 1200 02/19/23 1300 02/19/23 1400 02/19/23 1607  BP: 122/77 (!) 146/70 130/80 (!) 151/74  Pulse: 93 62 99 100  Resp: (!) 22 (!) 24 (!) 22 16  Temp:    98.1 F (36.7 C)  TempSrc:    Oral  SpO2: 100% 99% 95% 98%  Weight:      Height:      PainSc:        Isolation Precautions No active isolations  Medications Medications  dapagliflozin propanediol (FARXIGA) tablet 10 mg (10 mg Oral Given 02/19/23 0921)  diltiazem (CARDIZEM CD) 24 hr capsule 240 mg (240 mg Oral Given 02/19/23 0921)  rivaroxaban (XARELTO) tablet 20 mg (20 mg Oral Given 02/19/23 1656)  cefTRIAXone (ROCEPHIN) 1 g in sodium chloride 0.9 % 100 mL IVPB (0 g Intravenous Stopped 02/19/23 0005)  furosemide (LASIX) injection 20 mg (20 mg Intravenous Given 02/19/23 1540)  potassium chloride SA (KLOR-CON M) CR tablet 40 mEq (40 mEq Oral Given 02/19/23 1522)  acetaminophen (TYLENOL) tablet 1,000 mg (1,000 mg Oral Given 02/18/23 1405)  sodium chloride 0.9 % bolus 1,000 mL (0 mLs Intravenous Stopped 02/18/23 1531)  ceFEPIme (MAXIPIME) 2 g in sodium chloride 0.9 % 100 mL IVPB (0 g Intravenous Stopped 02/18/23 1446)  metroNIDAZOLE (FLAGYL) IVPB 500 mg (0 mg  Intravenous Stopped 02/18/23 1531)  vancomycin (VANCOCIN) IVPB 1000 mg/200 mL premix (0 mg Intravenous Stopped 02/18/23 1539)    Followed by  vancomycin (VANCOCIN) IVPB 1000 mg/200 mL premix (0 mg Intravenous Stopped 02/18/23 1645)  acetaminophen (TYLENOL) tablet 650 mg (650 mg Oral Given 02/18/23 1956)  metolazone (ZAROXOLYN) tablet 2.5 mg (2.5 mg Oral Given 02/18/23 2332)  furosemide (LASIX) injection 20 mg (20 mg Intravenous Given 02/18/23 2223)  potassium chloride SA (KLOR-CON M) CR tablet 40 mEq (40 mEq Oral Given 02/19/23 0922)  furosemide (LASIX) injection 20 mg (20 mg Intravenous Given 02/19/23 1914)    Mobility walks with device( short distance with cane)     Focused Assessments Cardiac Assessment Handoff:  Cardiac Rhythm: Sinus tachycardia Lab Results  Component Value Date   TROPONINI <0.03 01/22/2018   No results found for: "DDIMER" Does the Patient currently have chest pain? No    R Recommendations: See Admitting Provider Note  Report given to:   Additional Notes:

## 2023-02-19 NOTE — Progress Notes (Signed)
Summary: [name] is a 62 y/o with past medical history of chronic hypoxic respiratory failure secondary to COPD on 3L at baseline, obstructive uropathy s/p right retrograde pyelogram, and permanent atrial fibrillation who presented to the ED from his cardiologist's office due to worsening shortness of breath. He was admitted to the Internal Medicine Teaching Service for acute HF exacerbation and pyelonephritis.   Subjective:  Day #1. No overnight events.   Patient endorses some improvement in SOB. Sits up to sleep at home. Patient states he is taking PO lasix 80 mg BID. Denies any CP, confusion, or dysuria. Has been using the bathroom often. Endorses an appetite, would like to eat some breakfast. Wears compression socks daily to help with leg swelling. Reviewed plan for the day and answered patient's questions.   Objective:  Vital signs in last 24 hours: Vitals:   02/19/23 1100 02/19/23 1156 02/19/23 1200 02/19/23 1300  BP: 125/87  122/77 (!) 146/70  Pulse: 80  93 62  Resp: 15  (!) 22 (!) 24  Temp:  98 F (36.7 C)    TempSrc:  Oral    SpO2: 99%  100% 99%  Weight:      Height:        Intake/Output Summary (Last 24 hours) at 02/19/2023 1341 Last data filed at 02/19/2023 1156 Gross per 24 hour  Intake 580 ml  Output 5850 ml  Net -5270 ml        Latest Ref Rng & Units 02/19/2023    4:58 AM 02/18/2023    2:28 PM 02/18/2023    1:57 PM  CBC  WBC 4.0 - 10.5 K/uL 4.4   7.2   Hemoglobin 13.0 - 17.0 g/dL 8.6  81.1    91.4  9.1   Hematocrit 39.0 - 52.0 % 27.1  32.0    32.0  29.3   Platelets 150 - 400 K/uL 211   271        Latest Ref Rng & Units 02/19/2023    4:58 AM 02/18/2023    2:28 PM 02/18/2023    1:57 PM  CMP  Glucose 70 - 99 mg/dL 782  956  94   BUN 8 - 23 mg/dL 13  14  12    Creatinine 0.61 - 1.24 mg/dL 2.13  0.86  5.78   Sodium 135 - 145 mmol/L 132  136    136  136   Potassium 3.5 - 5.1 mmol/L 3.1  4.5    4.5  4.4   Chloride 98 - 111 mmol/L 94  92  95    CO2 22 - 32 mmol/L 32   32   Calcium 8.9 - 10.3 mg/dL 8.1   8.7   Total Protein 6.5 - 8.1 g/dL   8.2   Total Bilirubin <1.2 mg/dL   0.7   Alkaline Phos 38 - 126 U/L   56   AST 15 - 41 U/L   18   ALT 0 - 44 U/L   13     Physical Exam Constitutional: Patient is sitting up in bed in no acute distress, answering and asking questions appropriately CV: Regular rate and rhythm without murmurs on auscultation. Difficult to assess JVD with body habitus, bilateral LE pitting edema present, extremities relatively cooler but well perfused Pulmonary/Respiratory: Normal respiratory effort on 2L O2, posterior lower lobe crackles on auscultation Abdominal: Normal BSx 3, conversing appropriately  Skin: Bilateral lower legs with extremely dry skin, no visible ulcerations or erythema.  Psych: Normal  mood and affect   Assessment/Plan:  Active Problems:   Acute exacerbation of CHF (congestive heart failure) (HCC)  Pyelonephritis  Nephrolithiasis Pt febrile to 103 F on day of admission, most recently 98.4 F. Given broad spectrum antibiotics in ED 2/2 concerns for sepsis in the emergency department. UA with 21-50 WBC and rare bacteria. History of nephrolithiasis with recent cystoscopy with laser for removal of right ureteral pelvic junction stone. Admission CT with non-obstructing stone bilaterally measuring up to 5 mm. Stopped vancomycin, cefepime, and flagyl (12/3), started IV ceftriaxone/Rocephin today (12/4).    Patient with good urine output, no gross hematuria. Denies any dysuria, endorses lower back pain but likely due to positioning rather than flank pain. Remains afebrile today. Most recent MAP 92, HR 93. Will continue to monitor for fever, WBC, and dysuria.    Plan:  - IV Ceftriaxone/Rocephin (Day 1, antibiotic day 2)  Acute on Chronic Hypoxic Respiratory Failure  Multifactorial w/ COPD and HF Presented with worsening shortness of breath and increased oxygen requirement (needed 5-6L of O2 when  baseline is 3L). Showed signs of volume overload with lower extremity edema, evidence of weight gain, pulmonary vascular congestion, and elevated BNP. Home medications include metolazone 2.5 mg (takes only on Wednesday) and PO lasix 80 mg BID. Unclear if patient has been taking his PO lasix as indicated at home, endorsed 40 mg BID to admitting team then 80 mg BID to day team. Given IV lasix 20 mg x 1 dose and PO metolazone 2.5 mg in ED with net output -2.6 L.    Patient's exacerbation may be due to underlying infection. Recent echo May 2024 showed LVEF 55-60%, normal LV and RV function with moderate LVH with no significant change from prior echo in 2021. Patient has been tolerating 1-2L supplemental O2. Bilateral posterior lobes with crackles on exam today and stable pitting edema in the lower extremities. Endorses some improvement in symptoms and good urine output after diuresis, will defer repeat echo for now. Given continued volume overload reordered IV lasix 20 mg x 1 dose today. Will continue to monitor output.   Plan:  - Repeated IV lasix 20 mg x 1 dose  - Daily Weights  - Strict I/Os - Monitor supplemental O2 requirements, O2 parameters 88-92%  New Lung Nodule  New nodule noted on admission CT scan, 9 x 6 mm subsolid right upper lobe. Favored to be more infectious/inflammatory. Recommended follow up in 3-6 months.    Enlarged Ascending Thoracic Aorta  Incidental finding on admission CT chest abdomen. Ascending thoracic aorta measures 4.1 cm, recommended annual imaging follow-up by CTA or MRA.   Atrial Fibrillation Underwent direct current cardioversion (DCCV) July 2017 and placed on Xarelto 20 mg daily. Patient is on diltiazem 240 mg BID for rate control. Resumed on admission.    Diet: Heart Healthy VTE: Xarelto home dose IVF: None Code: Full  Prior to Admission Living Arrangement: Home Anticipated Discharge Location: pending medical management Barriers to Discharge: medical  management  Dispo: Anticipated discharge in approximately 1-2 day(s).   Philomena Doheny, MD, PGY-1 02/19/2023, 1:41 PM Pager: [phone] After 5pm on weekdays and 1pm on weekends: On Call pager [phone]

## 2023-02-19 NOTE — ED Notes (Signed)
PT has been coughing non-stop with phlegm.PT states this is not  new but usually uses a nasal spray to help.

## 2023-02-20 DIAGNOSIS — N3000 Acute cystitis without hematuria: Secondary | ICD-10-CM | POA: Diagnosis not present

## 2023-02-20 DIAGNOSIS — I5033 Acute on chronic diastolic (congestive) heart failure: Secondary | ICD-10-CM | POA: Diagnosis not present

## 2023-02-20 DIAGNOSIS — I5032 Chronic diastolic (congestive) heart failure: Secondary | ICD-10-CM | POA: Diagnosis not present

## 2023-02-20 LAB — POTASSIUM: Potassium: 3.7 mmol/L (ref 3.5–5.1)

## 2023-02-20 LAB — CBC
HCT: 28.4 % — ABNORMAL LOW (ref 39.0–52.0)
Hemoglobin: 9.3 g/dL — ABNORMAL LOW (ref 13.0–17.0)
MCH: 28 pg (ref 26.0–34.0)
MCHC: 32.7 g/dL (ref 30.0–36.0)
MCV: 85.5 fL (ref 80.0–100.0)
Platelets: 225 10*3/uL (ref 150–400)
RBC: 3.32 MIL/uL — ABNORMAL LOW (ref 4.22–5.81)
RDW: 26.5 % — ABNORMAL HIGH (ref 11.5–15.5)
WBC: 5.1 10*3/uL (ref 4.0–10.5)
nRBC: 0 % (ref 0.0–0.2)

## 2023-02-20 LAB — BASIC METABOLIC PANEL
Anion gap: 10 (ref 5–15)
BUN: 15 mg/dL (ref 8–23)
CO2: 35 mmol/L — ABNORMAL HIGH (ref 22–32)
Calcium: 8.5 mg/dL — ABNORMAL LOW (ref 8.9–10.3)
Chloride: 86 mmol/L — ABNORMAL LOW (ref 98–111)
Creatinine, Ser: 0.8 mg/dL (ref 0.61–1.24)
GFR, Estimated: >60 mL/min (ref >60)
Glucose, Bld: 123 mg/dL — ABNORMAL HIGH (ref 70–99)
Potassium: 3 mmol/L — ABNORMAL LOW (ref 3.5–5.1)
Sodium: 131 mmol/L — ABNORMAL LOW (ref 135–145)

## 2023-02-20 LAB — MAGNESIUM: Magnesium: 2.3 mg/dL (ref 1.7–2.4)

## 2023-02-20 MED ORDER — POTASSIUM CHLORIDE CRYS ER 20 MEQ PO TBCR
40.0000 meq | EXTENDED_RELEASE_TABLET | Freq: Two times a day (BID) | ORAL | Status: AC
  Administered 2023-02-20 (×2): 40 meq via ORAL
  Filled 2023-02-20 (×2): qty 2

## 2023-02-20 MED ORDER — POTASSIUM CHLORIDE CRYS ER 20 MEQ PO TBCR
40.0000 meq | EXTENDED_RELEASE_TABLET | ORAL | Status: AC
  Administered 2023-02-20: 40 meq via ORAL
  Filled 2023-02-20: qty 2

## 2023-02-20 MED ORDER — POTASSIUM CHLORIDE CRYS ER 20 MEQ PO TBCR: 40.0000 meq | EXTENDED_RELEASE_TABLET | Freq: Every day | ORAL | Status: DC

## 2023-02-20 NOTE — Plan of Care (Signed)

## 2023-02-20 NOTE — Progress Notes (Signed)
   Patient nurse reported that potassium is 3.  ordered KCl 40 mEq oral.  Also informed to let internal medicine resident know about this patient.  Tereasa Coop, MD [facility] 02/20/2023, 5:28 AM

## 2023-02-20 NOTE — Progress Notes (Signed)
Summary: Frank Morales is a 62 y/o with past medical history of chronic hypoxic respiratory failure secondary to COPD on 3L at baseline, obstructive uropathy s/p right retrograde pyelogram, and permanent atrial fibrillation who presented to the ED from his cardiologist's office due to worsening shortness of breath. He was admitted to the Internal Medicine Teaching Service for acute HF exacerbation and pyelonephritis.   Subjective:  Day #2. No overnight events.   Patient sitting up in bed more alert and engaged in conversation. Endorses feeling much better compared to yesterday. SOB improving, able to check O2 sat at home, clarified appropriate parameters 88-92% given history of COPD. LE swelling also improving, legs feeling less tight. Denies dysuria or any new complaints.  Objective:  Vital signs in last 24 hours: Vitals:   02/19/23 1607 02/19/23 1913 02/20/23 0355 02/20/23 0400  BP: (!) 151/74 (!) 161/87 (!) 145/88 (!) 145/88  Pulse: 100 92  (!) 103  Resp: 16 18 18    Temp: 98.1 F (36.7 C) 98 F (36.7 C) 98.3 F (36.8 C)   TempSrc: Oral Oral Oral   SpO2: 98% 97%  100%  Weight:  (!) 140.1 kg (!) 139.9 kg   Height:        Intake/Output Summary (Last 24 hours) at 02/20/2023 1447 Last data filed at 02/20/2023 1444 Gross per 24 hour  Intake 954 ml  Output 5750 ml  Net -4796 ml        Latest Ref Rng & Units 02/20/2023    3:43 AM 02/19/2023    4:58 AM 02/18/2023    2:28 PM  CBC  WBC 4.0 - 10.5 K/uL 5.1  4.4    Hemoglobin 13.0 - 17.0 g/dL 9.3  8.6  57.8    46.9   Hematocrit 39.0 - 52.0 % 28.4  27.1  32.0    32.0   Platelets 150 - 400 K/uL 225  211         Latest Ref Rng & Units 02/20/2023    3:43 AM 02/19/2023    4:58 AM 02/18/2023    2:28 PM  CMP  Glucose 70 - 99 mg/dL 629  528  413   BUN 8 - 23 mg/dL 15  13  14    Creatinine 0.61 - 1.24 mg/dL 2.44  0.10  2.72   Sodium 135 - 145 mmol/L 131  132  136    136   Potassium 3.5 - 5.1 mmol/L 3.0  3.1  4.5    4.5    Chloride 98 - 111 mmol/L 86  94  92   CO2 22 - 32 mmol/L 35  32    Calcium 8.9 - 10.3 mg/dL 8.5  8.1      Physical Exam Constitutional: Patient sitting up in bed in no acute distress CV: RRR, no r/m/g, improved bilateral LE edema, warm extremities Pulmonary/Respiratory: Normal respiratory effort on 2L O2, no crackles bilaterally on auscultation, minimal wheezing  Skin: Bilateral lower legs with dry skin, no visible ulcerations or erythema  Psych: Normal mood and affect   Assessment/Plan:  Active Problems:   Acute exacerbation of CHF (congestive heart failure) (HCC)   Acute cystitis without hematuria  Pyelonephritis  Nephrolithiasis Pt afebrile for 24 hours on IV Ceftriaxone, WBC 5.1, patient voiding without issues, denies dysuria. Most recent MAP 107, intermittently tachycardic. Will continue to monitor for fever, WBC, and dysuria.    Plan:  - IV Ceftriaxone/Rocephin (Day 2, antibiotic day 3)  Acute on Chronic Hypoxic Respiratory Failure  Multifactorial w/ COPD and HF Patient's SOB and LE edema improving s/p IV lasix 20 mg x 3 doses with net output since admission -9L. Patient states his current weight of 308 lbs is around his baseline and the 287/280s lbs weight was during a time when he was sick. Will continue to diurese as patient still volume overloaded on exam and symptoms are improving.   Plan:  - IV lasix 20 mg BID - Daily Weights  - Strict I/Os - Monitor supplemental O2 requirements, O2 parameters 88-92%  Hypokalemia K 3.1 yesterday repleted with PO potassium, 3.0 today again repleted with PO potassium. Mg 2.3 WNL. Low K likely due to loop diuretic, will check PM K 12/5, monitor daily BMP and replete as needed.   New Lung Nodule  New nodule noted on admission CT scan, 9 x 6 mm subsolid right upper lobe. Favored to be more infectious/inflammatory. Recommended follow up in 3-6 months.    Enlarged Ascending Thoracic Aorta  Incidental finding on admission CT chest  abdomen. Ascending thoracic aorta measures 4.1 cm, recommended annual imaging follow-up by CTA or MRA.   Atrial Fibrillation Underwent direct current cardioversion (DCCV) July 2017 and placed on Xarelto 20 mg daily. Patient is on diltiazem 240 mg BID for rate control. Resumed on admission.    Diet: Heart Healthy VTE: Xarelto home dose IVF: None Code: Full  Prior to Admission Living Arrangement: Home Anticipated Discharge Location: pending medical management Barriers to Discharge: medical management  Dispo: Anticipated discharge in approximately 1-2 day(s).   Philomena Doheny, MD, PGY-1 02/20/2023, 2:47 PM Pager: [phone] After 5pm on weekdays and 1pm on weekends: On Call pager [phone]

## 2023-02-21 ENCOUNTER — Other Ambulatory Visit: Payer: Self-pay | Admitting: Student

## 2023-02-21 ENCOUNTER — Other Ambulatory Visit (HOSPITAL_COMMUNITY): Payer: Self-pay

## 2023-02-21 DIAGNOSIS — I509 Heart failure, unspecified: Secondary | ICD-10-CM

## 2023-02-21 DIAGNOSIS — I5033 Acute on chronic diastolic (congestive) heart failure: Secondary | ICD-10-CM | POA: Diagnosis not present

## 2023-02-21 DIAGNOSIS — N3000 Acute cystitis without hematuria: Secondary | ICD-10-CM | POA: Diagnosis not present

## 2023-02-21 DIAGNOSIS — I5032 Chronic diastolic (congestive) heart failure: Secondary | ICD-10-CM

## 2023-02-21 LAB — BASIC METABOLIC PANEL
Anion gap: 8 (ref 5–15)
BUN: 17 mg/dL (ref 8–23)
CO2: 36 mmol/L — ABNORMAL HIGH (ref 22–32)
Calcium: 8.6 mg/dL — ABNORMAL LOW (ref 8.9–10.3)
Chloride: 87 mmol/L — ABNORMAL LOW (ref 98–111)
Creatinine, Ser: 0.72 mg/dL (ref 0.61–1.24)
GFR, Estimated: >60 mL/min (ref >60)
Glucose, Bld: 90 mg/dL (ref 70–99)
Potassium: 3.2 mmol/L — ABNORMAL LOW (ref 3.5–5.1)
Sodium: 131 mmol/L — ABNORMAL LOW (ref 135–145)

## 2023-02-21 LAB — CBC
HCT: 27.4 % — ABNORMAL LOW (ref 39.0–52.0)
Hemoglobin: 8.9 g/dL — ABNORMAL LOW (ref 13.0–17.0)
MCH: 27.6 pg (ref 26.0–34.0)
MCHC: 32.5 g/dL (ref 30.0–36.0)
MCV: 84.8 fL (ref 80.0–100.0)
Platelets: 252 10*3/uL (ref 150–400)
RBC: 3.23 MIL/uL — ABNORMAL LOW (ref 4.22–5.81)
RDW: 26.3 % — ABNORMAL HIGH (ref 11.5–15.5)
WBC: 5 10*3/uL (ref 4.0–10.5)
nRBC: 0 % (ref 0.0–0.2)

## 2023-02-21 MED ORDER — TORSEMIDE 20 MG PO TABS
20.0000 mg | ORAL_TABLET | Freq: Every day | ORAL | Status: DC
  Administered 2023-02-21: 20 mg via ORAL
  Filled 2023-02-21: qty 1

## 2023-02-21 MED ORDER — ACETAMINOPHEN 325 MG PO TABS
650.0000 mg | ORAL_TABLET | Freq: Four times a day (QID) | ORAL | Status: DC | PRN
  Administered 2023-02-21: 650 mg via ORAL
  Filled 2023-02-21: qty 2

## 2023-02-21 MED ORDER — TORSEMIDE 20 MG PO TABS
20.0000 mg | ORAL_TABLET | Freq: Every day | ORAL | 1 refills | Status: DC
  Filled 2023-02-21: qty 30, 30d supply, fill #0

## 2023-02-21 MED ORDER — UMECLIDINIUM-VILANTEROL 62.5-25 MCG/ACT IN AEPB
1.0000 | INHALATION_SPRAY | Freq: Every day | RESPIRATORY_TRACT | Status: DC
  Administered 2023-02-21: 1 via RESPIRATORY_TRACT
  Filled 2023-02-21: qty 14

## 2023-02-21 MED ORDER — CIPROFLOXACIN HCL 500 MG PO TABS
500.0000 mg | ORAL_TABLET | Freq: Two times a day (BID) | ORAL | Status: DC
  Administered 2023-02-21: 500 mg via ORAL
  Filled 2023-02-21: qty 1

## 2023-02-21 MED ORDER — GUAIFENESIN 100 MG/5ML PO LIQD: 5.0000 mL | ORAL | Status: DC | PRN

## 2023-02-21 MED ORDER — ALBUTEROL SULFATE HFA 108 (90 BASE) MCG/ACT IN AERS: 1.0000 | INHALATION_SPRAY | RESPIRATORY_TRACT | Status: DC | PRN

## 2023-02-21 MED ORDER — POTASSIUM CHLORIDE CRYS ER 20 MEQ PO TBCR
40.0000 meq | EXTENDED_RELEASE_TABLET | Freq: Two times a day (BID) | ORAL | Status: DC
  Administered 2023-02-21: 40 meq via ORAL
  Filled 2023-02-21: qty 2

## 2023-02-21 MED ORDER — BENZONATATE 100 MG PO CAPS
100.0000 mg | ORAL_CAPSULE | Freq: Two times a day (BID) | ORAL | Status: DC
Start: 2023-02-21 — End: 2023-02-21
  Administered 2023-02-21: 100 mg via ORAL
  Filled 2023-02-21: qty 1

## 2023-02-21 MED ORDER — ALBUTEROL SULFATE (2.5 MG/3ML) 0.083% IN NEBU
2.5000 mg | INHALATION_SOLUTION | RESPIRATORY_TRACT | Status: DC | PRN
  Administered 2023-02-21: 2.5 mg via RESPIRATORY_TRACT
  Filled 2023-02-21: qty 3

## 2023-02-21 MED ORDER — CIPROFLOXACIN HCL 500 MG PO TABS
500.0000 mg | ORAL_TABLET | Freq: Two times a day (BID) | ORAL | 0 refills | Status: AC
  Filled 2023-02-21: qty 7, 4d supply, fill #0

## 2023-02-21 MED ORDER — FLUTICASONE PROPIONATE HFA 110 MCG/ACT IN AERO
1.0000 | INHALATION_SPRAY | Freq: Every day | RESPIRATORY_TRACT | 12 refills | Status: AC
  Filled 2023-02-21: qty 12, 30d supply, fill #0

## 2023-02-21 MED ORDER — POTASSIUM CHLORIDE CRYS ER 20 MEQ PO TBCR
40.0000 meq | EXTENDED_RELEASE_TABLET | Freq: Every day | ORAL | 2 refills | Status: DC
  Filled 2023-02-21: qty 120, 60d supply, fill #0

## 2023-02-21 NOTE — Plan of Care (Signed)

## 2023-02-21 NOTE — Progress Notes (Signed)
Discharge instructions reviewed/read to pt. Education provided on low sodium diet, discussed meds and new changes in meds. Pt verbalized understanding and able to teach back.  Copy of instructions given to pt. [facility] has filled pt's scripts and will be picked up on the way out for discharge. Pt's ride is on their way and will be here shortly. Pt is on 3L O2, and will be taken out on O2 at discharge.  Pt to be  d/c'd via wheelchair with belongings and will be escorted by staff.   Isrrael Fluckiger,RN SWOT

## 2023-02-21 NOTE — Discharge Instructions (Addendum)
Frank Morales,  You were recently admitted to [facility] for pyelonephritis which is an infection in your kidney and difficulty breathing due to fluid overload.  We treated you with antibiotics and IV diuretics.  We think that you are doing much better and I have outlined your medication changes below.  He will finish out a course of antibiotics and your diuretic at home will change.  Please continue to weigh yourself daily at home and if you notice a 2 pound weight gain in 1 day or 5 pounds in 5 days please take an extra dose of your torsemide in the afternoon and call our clinic at [phone].  You will follow-up in the internal medicine center on 03/06/2023 at 1:15 PM but before then we would like you to come in sometime next week to have some blood drawn, your weight taken, and blood pressure taken.  When we have the results of your blood draw we will call you or we will discuss them with you when you come back for your appointment on 12/19.  Continue taking your home medications with the following changes  Start taking Torsemide (Demadex) 20 mg daily Potassium chloride 40 mEq daily (2 pills of your current potassium but just once daily now instead of twice daily) Stop taking Furosemide (Lasix) Continue taking your other medications as prescribed and outlined in a list attached to this packet   You should seek further medical care if you have any return of your symptoms including fever, back pain, difficulty urinating, frequent urination, pain with urination, difficulty breathing, worsening lower extremity edema, or other bothersome symptoms.  Sincerely, Rocky Morel, DO

## 2023-02-21 NOTE — Discharge Summary (Signed)
Name: Frank Morales MRN: [id number] DOB: 1960/07/02 62 y.o. PCP: Olegario Messier, MD  Date of Admission: 02/18/2023 12:54 PM Date of Discharge: 02/21/2023 2:08 PM Attending Physician: Dr. Lafonda Mosses  Discharge Diagnosis: Active Problems:   Acute exacerbation of CHF (congestive heart failure) (HCC)   Acute cystitis without hematuria   Acute on chronic heart failure with preserved ejection fraction [facility])    Discharge Medications: Allergies as of 02/21/2023       Reactions   Lactose Intolerance (gi) Nausea And Vomiting        Medication List     STOP taking these medications    furosemide 80 MG tablet Commonly known as: LASIX   pantoprazole 20 MG tablet Commonly known as: PROTONIX   phenazopyridine 100 MG tablet Commonly known as: PYRIDIUM   polyethylene glycol 17 g packet Commonly known as: MiraLax   senna-docusate 8.6-50 MG tablet Commonly known as: Senokot-S       TAKE these medications    albuterol 108 (90 Base) MCG/ACT inhaler Commonly known as: VENTOLIN HFA Inhale 2 puffs into the lungs every 6 (six) hours as needed for wheezing or shortness of breath.   Anoro Ellipta 62.5-25 MCG/ACT Aepb Generic drug: umeclidinium-vilanterol INHALE 1 PUFF INTO THE LUNGS DAILY   azelastine 0.1 % nasal spray Commonly known as: ASTELIN 2 sprays each nostril twice a day as needed   ciprofloxacin 500 MG tablet Commonly known as: CIPRO Take 1 tablet (500 mg total) by mouth 2 (two) times daily for 4 days.   dapagliflozin propanediol 10 MG Tabs tablet Commonly known as: Farxiga TAKE 1 TABLET(10 MG) BY MOUTH DAILY BEFORE BREAKFAST   diltiazem 240 MG 24 hr capsule Commonly known as: CARDIZEM CD TAKE 2 CAPSULES(480 MG) BY MOUTH DAILY   fluticasone 110 MCG/ACT inhaler Commonly known as: FLOVENT HFA Inhale 1 puff into the lungs daily.   fluticasone 50 MCG/ACT nasal spray Commonly known as: FLONASE 2 sprays each nostril twice a day as needed   metolazone 2.5 MG  tablet Commonly known as: ZAROXOLYN Take 1 tablet (2.5 mg total) by mouth once a week. Take every Wednesday.   potassium chloride SA 20 MEQ tablet Commonly known as: KLOR-CON M Take 2 tablets (40 mEq total) by mouth daily. What changed: when to take this   rivaroxaban 20 MG Tabs tablet Commonly known as: Xarelto Take 1 tablet (20 mg total) by mouth daily with supper.   torsemide 20 MG tablet Commonly known as: DEMADEX Take 1 tablet (20 mg total) by mouth daily. Start taking on: February 22, 2023        Disposition and follow-up:   Frank Morales was discharged from [facility] in Stable condition.  At the hospital follow up visit please address:  1.  Follow-up:  Pyelonephritis, Nephrolithiasis - Afebrile and asymptomatic on day of discharge. Patient discharged with 4 days of ciprofloxacin 500 mg BID (12/7-12/10) delivered by TOC. Please follow up that he completed his antibiotics and symptoms resolved.   Acute on Chronic Hypoxic Respiratory Failure  - Patient with significant output on IV lasix 20 mg BID. Discharge weight 304 lbs, baseline seems to be around 300 lbs. Patient asked to get daily weights at home. Changed his home regimen with furosemide 80 mg BID and metolazone 2.5 mg on Wednesdays to torsemide 20 mg daily and metolazone 2.5 mg on Wednesdays. Back to baseline home O2. Please assess volume status and medication adherence.  Hypokalemia - Patient discharged on potassium chloride  40 mEq total daily. Please follow up on [facility] lab visit for BMP scheduled 1 week after discharge. Please check BMP at hospital follow-up appointment and adjust regimen as needed.   New Lung Nodule  - New nodule noted on admission CT scan, 9 x 6 mm subsolid right upper lobe, repeat CT in 3-6 months.     Enlarged Ascending Thoracic Aorta  - Ascending thoracic aorta measures 4.1 cm, annual CTA or MRA recommended.   2.  Labs / imaging needed at time of follow-up:  - BMP  lab visit < 1 week after discharge - BMP, CBC - CT in 3-6 months for new lung nodule in RUL - CTA or MRA annually for enlarged ascending aorta  3.  Pending labs/ test needing follow-up: n/a  4.  Medication Changes  STOPPED - Furosemide (see below)  - Pantoprazole  - Phenazopyridine   - Polyethylene glycol   - Senna-docusate   ADDED  - Torsemide (see below)   MODIFIED  - Daily potassium regimen: from twice daily to once daily   - Diuretics regimen: stopped furosemide, started torsemide and adjusted dose  Follow-up Appointments: Hospitalization follow-up appointment 03/06/23 at 1:15 PM with Dr. Carron Brazen at the Internal Medicine [facility] at [facility], [phone].  Hospital Course by problem list: Summary: Frank Morales is a 62 y/o with past medical history of chronic hypoxic respiratory failure secondary to COPD on 3L at baseline, obstructive uropathy s/p right retrograde pyelogram, and permanent atrial fibrillation who presented to the ED from his cardiologist's office due to worsening shortness of breath. He was admitted to the Internal Medicine Teaching Service on 12/3 for acute HF exacerbation and pyelonephritis.   Pyelonephritis  Nephrolithiasis Pt febrile to 103 F on day of admission. Given broad spectrum antibiotics in ED 2/2 concerns for sepsis in the emergency department. UA with 21-50 WBC and rare bacteria. History of nephrolithiasis with recent cystoscopy with laser for removal of right ureteral pelvic junction stone. Admission CT with non-obstructing stone bilaterally measuring up to 5 mm. Patient remained afebrile for 48 hours, denied dysuria or hematuria on day of discharge. Stopped vancomycin, cefepime, and flagyl (12/3), started IV ceftriaxone/Rocephin 12/4. Will give 4 days of ciprofloxacin 500 mg BID at discharge.    Acute on Chronic Hypoxic Respiratory Failure  Multifactorial w/ history of COPD and HF Presented with worsening shortness of breath and increased  oxygen requirement (needed 5-6 L of O2 when baseline is 3 L). Showed signs of volume overload with lower extremity edema, evidence of weight gain, pulmonary vascular congestion, and elevated BNP. Recent echo May 2024 showed LVEF 55-60%, normal LV and RV function with moderate LVH with no significant change from prior echo in 2021. Home medications include metolazone 2.5 mg (takes only on Wednesday), PO lasix 80 mg BID, and Farxiga 10 mg daily. Unclear if patient has been taking his PO lasix as indicated at home, endorsed 40 mg BID to admitting team then 80 mg BID to day team. Fill history indicates he may have been without lasix for a few weeks. Patient was given IV lasix 20 mg x 1 dose and PO metolazone 2.5 mg in ED with net output -2.6 L, given 2 days of IV lasix 20 mg BID.   Patient endorsed improvement in symptoms, lung exam without crackles, intermittent coughing, and net output -12L since admission. Baseline dry weight seems to be around 300 lbs, with patient weighing 304 lb on day of discharge. Discussed options to continue diuresing at  home with PO diuretics given his overall improvement in clinical presentation or continuing to stay in hospital for diuresis and patient selected to go home. Discharged on PO torsemide 20 mg daily and metolazone 2.5 mg on Wednesdays.    Patient's inhalers include albuterol TID, Anoro Ellipta daily, and Flovent daily. Endorsed some coughing but no increased sputum. Continued albuterol inhaler inpatient and patient given robutussin PRN and tessalon perles BID for intermittent cough. Discharged on home inhalers.   Hypokalemia Patient with persistently low K (3-3.2) during admission likely due to diuresis as Mg normal. Changed home regimen of K to potassium chloride 40 mEq total daily. Instructed patient to get BMP (ordered) at [facility] lab visit next week. Recommend outpatient monitoring.      New Lung Nodule  New nodule noted on admission CT scan, 9 x 6 mm subsolid right  upper lobe. Favored to be more infectious/inflammatory. Recommended follow up CT in 3-6 months.     Enlarged Ascending Thoracic Aorta  Incidental finding on admission CT chest abdomen. Ascending thoracic aorta measures 4.1 cm, recommended annual imaging follow-up by CTA or MRA.    Atrial Fibrillation Underwent direct current cardioversion (DCCV) July 2017 and placed on Xarelto 20 mg daily. Patient is on diltiazem 240 mg BID for rate control. Resumed on admission.    Discharge Subjective: Day #3. No overnight events. Patient is sitting up in bed, appears clinically well. Endorses improved SOB and lower extremity edema. Satting well on 2L O2 via [place] at rest, ambulated with near 100% sat on his baseline 3L with no complaints of SOB. Discussed option to stay for another day for further diuresis or to continue diuresing at home given overall improvement. Patient endorsed wanting to go home. Discussed importance of follow up for lung findings on imaging as well as annual imaging for enlarged ascending aorta, patient endorsed understanding. Patient has scale at home, needs to replace batteries. Will weigh himself at home. Will also go to lab visit next week to check on potassium level. Patient with good insight to importance of taking his PO diuretics at home consistently.   Discharge Exam:   Blood pressure 122/82, pulse 84, temperature 98.3 F (36.8 C), temperature source Oral, resp. rate 20, height 5\' 11"  (1.803 m), weight (!) 138.1 kg, SpO2 100%.  Constitutional: Patient appears clinically well, in no acute distress, conversing appropriately HENT: normocephalic atraumatic, mucous membranes moist Cardiovascular: regular rate and rhythm, no m/r/g, LE edema improving, warm extremities Pulmonary/Chest: normal work of breathing on 2L O2 via [place], lungs clear bilaterally on auscultation Abdominal: soft, non-tender, non-distended Neurological: alert & oriented, no acute deficits observed MSK: no gross  abnormalities Skin: warm and dry, dry skin bilateral lower extremities Psych: normal mood and affect  Pertinent Labs, Studies, and Procedures:     Latest Ref Rng & Units 02/21/2023    3:39 AM 02/20/2023    3:43 AM 02/19/2023    4:58 AM  CBC  WBC 4.0 - 10.5 K/uL 5.0  5.1  4.4   Hemoglobin 13.0 - 17.0 g/dL 8.9  9.3  8.6   Hematocrit 39.0 - 52.0 % 27.4  28.4  27.1   Platelets 150 - 400 K/uL 252  225  211        Latest Ref Rng & Units 02/21/2023    3:39 AM 02/20/2023    2:52 PM 02/20/2023    3:43 AM  CMP  Glucose 70 - 99 mg/dL 90   846   BUN 8 - 23 mg/dL  17   15   Creatinine 0.61 - 1.24 mg/dL 9.81   1.91   Sodium 478 - 145 mmol/L 131   131   Potassium 3.5 - 5.1 mmol/L 3.2  3.7  3.0   Chloride 98 - 111 mmol/L 87   86   CO2 22 - 32 mmol/L 36   35   Calcium 8.9 - 10.3 mg/dL 8.6   8.5     CT CHEST ABDOMEN PELVIS WO CONTRAST  Result Date: 02/18/2023 CLINICAL DATA:  Fever, tachycardia, and shortness of breath. Sepsis. EXAM: CT CHEST, ABDOMEN AND PELVIS WITHOUT CONTRAST TECHNIQUE: Multidetector CT imaging of the chest, abdomen and pelvis was performed following the standard protocol without IV contrast. RADIATION DOSE REDUCTION: This exam was performed according to the departmental dose-optimization program which includes automated exposure control, adjustment of the mA and/or kV according to patient size and/or use of iterative reconstruction technique. COMPARISON:  Same day chest radiograph, CT chest dated 01/07/2023, CT abdomen and pelvis dated 10/01/2022 FINDINGS: CT CHEST FINDINGS Decreased sensitivity and specificity for detailed findings due to motion artifact. Cardiovascular: Multichamber cardiomegaly. No significant pericardial fluid/thickening. Main pulmonary artery measures 3.6 cm, unchanged. Ascending aorta measures 4.1 cm. Mediastinum/Nodes: Imaged thyroid gland without nodules meeting criteria for imaging follow-up by size. Normal esophagus. Unchanged right paratracheal lymph node  measures 11 mm (2:36). Lungs/Pleura: The central airways are patent. Unchanged round atelectasis in the right middle and lower lobes. Similar upper lung predominant paraseptal emphysema. Similar right lung volume loss. New 9 x 6 mm subsolid right upper lobe nodule (3:68) no pneumothorax. Unchanged small right pleural effusion with loculated component. Musculoskeletal: No acute or abnormal lytic or blastic osseous lesions. CT ABDOMEN PELVIS FINDINGS Hepatobiliary: No focal hepatic lesions. No intra or extrahepatic biliary ductal dilation. Normal gallbladder. Pancreas: No focal lesions or main ductal dilation. Spleen: Normal in size without focal abnormality. Adrenals/Urinary Tract: No adrenal nodules. No suspicious renal mass on this noncontrast enhanced examination or hydronephrosis. Bilateral nonobstructing renal stones measuring up to 5 mm. Asymmetric right perinephric stranding, likely sequela prior infection/inflammation. No focal bladder wall thickening. Stomach/Bowel: Normal appearance of the stomach. No evidence of bowel wall thickening, distention, or inflammatory changes. Normal appendix. Vascular/Lymphatic: Aortic atherosclerosis. Unchanged 11 mm pericaval lymph node (2:86) and 11 mm aortocaval lymph node (2:87). Increased size of bilateral pelvic lymph nodes, for example 12 mm right common iliac lymph node (2:112), previously 4 mm, and 13 mm left external iliac lymph node (2:119), previously 11 mm. Reproductive: Prostate is unremarkable. Other: No free fluid, fluid collection, or free air. Musculoskeletal: No acute or abnormal lytic or blastic osseous findings. Multilevel degenerative changes of the lumbar spine. IMPRESSION: Decreased sensitivity and specificity for detailed findings due to motion artifact. CHEST: 1. New 9 x 6 mm subsolid right upper lobe nodule, likely infectious/inflammatory. Follow-up non-contrast CT recommended at 3-6 months to confirm persistence. If unchanged, and solid component  remains <6 mm, annual CT is recommended until 5 years of stability has been established. If persistent these nodules should be considered highly suspicious if the solid component of the nodule is 6 mm or greater in size and enlarging. This recommendation follows the consensus statement: Guidelines for Management of Incidental Pulmonary Nodules Detected on CT Images: From the Fleischner Society 2017; Radiology 2017; 284:228-243. 2. Unchanged chronic small right pleural effusion with loculated component and round atelectasis in the right middle and lower lobes. 3. Unchanged enlarged main pulmonary artery, which can be seen in the setting of pulmonary hypertension.  4. Multichamber cardiomegaly. Ascending thoracic aorta measures 4.1 cm. Recommend annual imaging followup by CTA or MRA. This recommendation follows 2010 ACCF/AHA/AATS/ACR/ASA/SCA/SCAI/SIR/STS/SVM Guidelines for the Diagnosis and Management of Patients with Thoracic Aortic Disease. Circulation. 2010; 121: N829-F621. Aortic aneurysm NOS (ICD10-I71.9) ABDOMEN/PELVIS: 1. Multi station abdominopelvic lymphadenopathy with increased size of bilateral pelvic lymph nodes, nonspecific. Recommend correlation with laboratory findings of lymphoproliferative process. 2. Bilateral nonobstructing renal stones measuring up to 5 mm. Asymmetric right perinephric stranding, likely sequela of prior infection/inflammation. Pyelonephritis can have a similar appearance. Aortic Atherosclerosis (ICD10-I70.0) and Emphysema (ICD10-J43.9). Electronically Signed   By: Agustin Cree M.D.   On: 02/18/2023 19:37   DG Chest 1 View  Result Date: 02/18/2023 CLINICAL DATA:  Shortness of breath.  History of CHF and COPD. EXAM: CHEST  1 VIEW COMPARISON:  Chest radiograph dated October 01, 2022. FINDINGS: Stable cardiomegaly. Pulmonary vascular congestion with small right-greater-than-left pleural effusions. No pneumothorax. No acute osseous abnormality. IMPRESSION: Cardiomegaly with pulmonary  vascular congestion and small right-greater-than-left pleural effusions. Electronically Signed   By: Hart Robinsons M.D.   On: 02/18/2023 14:15    Discharge Instructions: Discharge Instructions     Diet - low sodium heart healthy   Complete by: As directed    Increase activity slowly   Complete by: As directed       Signed: Tyrease Vandeberg Colbert Coyer, MD [facility] - PGY1 Pager: [phone] 02/21/2023, 2:08 PM    Please contact the on call pager after 5 pm and on weekends at [phone].

## 2023-02-23 LAB — CULTURE, BLOOD (ROUTINE X 2)
Culture: NO GROWTH
Culture: NO GROWTH
Special Requests: ADEQUATE
Special Requests: ADEQUATE

## 2023-03-03 ENCOUNTER — Other Ambulatory Visit (INDEPENDENT_AMBULATORY_CARE_PROVIDER_SITE_OTHER): Payer: Medicaid Other

## 2023-03-03 DIAGNOSIS — I509 Heart failure, unspecified: Secondary | ICD-10-CM

## 2023-03-04 LAB — BMP8+ANION GAP
Anion Gap: 15 mmol/L (ref 10.0–18.0)
BUN/Creatinine Ratio: 23 (ref 10–24)
BUN: 22 mg/dL (ref 8–27)
CO2: 29 mmol/L (ref 20–29)
Calcium: 9.2 mg/dL (ref 8.6–10.2)
Chloride: 94 mmol/L — ABNORMAL LOW (ref 96–106)
Creatinine, Ser: 0.95 mg/dL (ref 0.76–1.27)
Glucose: 96 mg/dL (ref 70–99)
Potassium: 4 mmol/L (ref 3.5–5.2)
Sodium: 138 mmol/L (ref 134–144)
eGFR: 91 mL/min/{1.73_m2} (ref >59)

## 2023-03-06 ENCOUNTER — Ambulatory Visit: Payer: Medicaid Other | Admitting: Student

## 2023-03-06 VITALS — BP 105/48 | HR 90 | Temp 98.4°F | Ht 71.0 in | Wt 317.7 lb

## 2023-03-06 DIAGNOSIS — N12 Tubulo-interstitial nephritis, not specified as acute or chronic: Secondary | ICD-10-CM | POA: Diagnosis not present

## 2023-03-06 DIAGNOSIS — I77810 Thoracic aortic ectasia: Secondary | ICD-10-CM

## 2023-03-06 DIAGNOSIS — R911 Solitary pulmonary nodule: Secondary | ICD-10-CM | POA: Diagnosis not present

## 2023-03-06 DIAGNOSIS — I5033 Acute on chronic diastolic (congestive) heart failure: Secondary | ICD-10-CM | POA: Diagnosis present

## 2023-03-06 MED ORDER — TORSEMIDE 20 MG PO TABS: 20.0000 mg | ORAL_TABLET | Freq: Every day | ORAL | 3 refills | Status: DC

## 2023-03-06 MED ORDER — TORSEMIDE 20 MG PO TABS: 20.0000 mg | ORAL_TABLET | Freq: Every day | ORAL | 1 refills | Status: DC

## 2023-03-06 MED ORDER — METOLAZONE 2.5 MG PO TABS: 2.5000 mg | ORAL_TABLET | ORAL | 1 refills | Status: AC

## 2023-03-06 NOTE — Assessment & Plan Note (Addendum)
New nodule noted on admission CT scan, 9 x 6 mm subsolid right upper lobe, with recommendation to repeat in 3-6 months. -CT of the chest  in 3 months (March - June 2025)

## 2023-03-06 NOTE — Assessment & Plan Note (Signed)
Post discharge, he has been doing well.  Denies abdominal pain or fevers.  Endorses completion of 4 days of Cipro on 500 mg twice daily postdischarge. He has follow-up with urology scheduled for January 2025.

## 2023-03-06 NOTE — Assessment & Plan Note (Addendum)
He has been compliant with torsemide 20 mg daily and metolazone 2.5 on every Wednesdays.  He has not been consistent with his weight measurement at home, however he states last week his weight was around 317.  Notably patient's dry weight day on the day of discharge was 305 lbs. Weight at his office visit is 317lbs  He is on 2 L of supplemental oxygen, not endorsing shortness of breath.  Lungs are clear bilateral.  He has pitting edema to the level of the knees, bilaterally with skin changes.  Patient agreeable to uptitrating torsemide dose to 20 mg twice a day for a week.  In addition, he would do a daily weight checks, with a tentative plan to have a home health RN to help with blood pressure and weight checks.  He had a BMP check 12/17 that was normal.  -Increase torsemide to 20 mg twice a day. -Home health RN order placed. -BMP at next office visit. -Follow-up in clinic after the holidays.

## 2023-03-06 NOTE — Patient Instructions (Addendum)
Thank you, Mr.Hildred Wicke Feehan for allowing [place] to provide your care today.   Today we discussed :  Your legs are swollen, and your weight his 10 pounds above her dry weight.  Please take torsemide 20 mg twice a day for 1 week.  Please continue to take your body weight daily.  Continue taking torsemide 20 mg once a day starting December 27th.  2.  Continue to weigh yourself at home every day. Your dry weight when you were leaving the hospital was around 305 lbs.   I have ordered the following labs for you:  Lab Orders  No laboratory test(s) ordered today     Referrals ordered today:   Referral Orders  No referral(s) requested today     I have ordered the following medication/changed the following medications:   Stop the following medications: There are no discontinued medications.   Start the following medications: No orders of the defined types were placed in this encounter.    Follow up:  Earliest appt after the holidays for bmp, and diuretics titration      Should you have any questions or concerns please call the internal medicine clinic at [phone].    Laretta Bolster, MD  [facility]

## 2023-03-06 NOTE — Assessment & Plan Note (Signed)
Ascending thoracic aorta measures 4.1 cm, annual CTA or MRA recommended.

## 2023-03-06 NOTE — Progress Notes (Signed)
CC: Hospital follow-up  HPI:  [name] is a 62 y.o. male living with a history stated below and presents today for hospital follow-up.Marland Kitchen Please see problem based assessment and plan for additional details.  Past Medical History:  Diagnosis Date   Acute exacerbation of CHF (congestive heart failure) (HCC) 12/15/2019   Acute heart failure (HCC) 12/16/2019   Anemia    Angio-edema    Arthritis    Atrial fibrillation and flutter (HCC) 08/17/2015   A. S/p failed DCCV // b. Severe BAE on echo >> rate control strategy (has seen AF clinic) // c. Xarelto for anticoag (CHADS2-VASc=2 / CHF, HTN)   Chronic diastolic CHF (congestive heart failure) (HCC) 08/30/2015   A. Echo 6/17: Apical HK, moderate focal basal and mild concentric LVH, EF 50-55, diffuse HK, trivial MR, severe BAE, mild TR, PASP 37   COPD (chronic obstructive pulmonary disease) (HCC)    Dependence on continuous supplemental oxygen    3L   Dyspnea    Dysrhythmia    Afib   Hematuria 01/10/2021   Hepatic fibrosis 02/29/2016   F3/4 on elastography.    Hepatitis C, chronic (HCC) 08/19/2015   Hepatitis C, chronic (HCC) 08/19/2015   Treated in 2017 with SVR   History of cardiac catheterization    a. LHC 6/17: LAD irregs, o/w no CAD   History of kidney stones    Hypertension    Hyposmia 12/22/2017   OSA (obstructive sleep apnea) 11/21/2015   No Cpap   Oxygen dependent    Pleural effusion on right 03/14/2018   Pre-diabetes    Sleep apnea    Tobacco abuse     Current Outpatient Medications on File Prior to Visit  Medication Sig Dispense Refill   albuterol (VENTOLIN HFA) 108 (90 Base) MCG/ACT inhaler Inhale 2 puffs into the lungs every 6 (six) hours as needed for wheezing or shortness of breath. 8 g 2   ANORO ELLIPTA 62.5-25 MCG/ACT AEPB INHALE 1 PUFF INTO THE LUNGS DAILY 60 each 2   azelastine (ASTELIN) 0.1 % nasal spray 2 sprays each nostril twice a day as needed 90 mL 1   dapagliflozin propanediol (FARXIGA) 10  MG TABS tablet TAKE 1 TABLET(10 MG) BY MOUTH DAILY BEFORE BREAKFAST 90 tablet 3   diltiazem (CARDIZEM CD) 240 MG 24 hr capsule TAKE 2 CAPSULES(480 MG) BY MOUTH DAILY 180 capsule 3   fluticasone (FLONASE) 50 MCG/ACT nasal spray 2 sprays each nostril twice a day as needed 36 g 1   fluticasone (FLOVENT HFA) 110 MCG/ACT inhaler Inhale 1 puff into the lungs daily. 12 g 12   potassium chloride SA (KLOR-CON M) 20 MEQ tablet Take 2 tablets (40 mEq total) by mouth daily. 120 tablet 2   rivaroxaban (XARELTO) 20 MG TABS tablet Take 1 tablet (20 mg total) by mouth daily with supper. 90 tablet 2   No current facility-administered medications on file prior to visit.     Review of Systems: ROS negative except for what is noted on the assessment and plan.  Vitals:   03/06/23 1308  BP: (!) 105/48  Pulse: 90  Temp: 98.4 F (36.9 C)  TempSrc: Oral  SpO2: 100%  Weight: (!) 317 lb 11.2 oz (144.1 kg)  Height: 5\' 11"  (1.803 m)    Physical Exam: Constitutional: Well appearing, not in acute distress. Cardiovascular: regular rate and rhythm, no m/r/g. No JVD elevation. Pulmonary/Chest: normal work of breathing on 2 Lnc, lungs clear to auscultation bilaterally. Extremities: Bilateral pitting edema  to the level of the knees bilaterally, with skin changes.   Assessment & Plan:   Patient seen with Dr. Mayford Knife  Acute on chronic heart failure with preserved ejection fraction [facility]) He has been compliant with torsemide 20 mg daily and metolazone 2.5 on every Wednesdays.  He has not been consistent with his weight measurement at home, however he states last week his weight was around 317.  Notably patient's dry weight day on the day of discharge was 305 lbs. Weight at his office visit is 317lbs  He is on 2 L of supplemental oxygen, not endorsing shortness of breath.  Lungs are clear bilateral.  He has pitting edema to the level of the knees, bilaterally with skin changes.  Patient agreeable to uptitrating  torsemide dose to 20 mg twice a day for a week.  In addition, he would do a daily weight checks, with a tentative plan to have a home health RN to help with blood pressure and weight checks.  He had a BMP check 12/17 that was normal.  -Increase torsemide to 20 mg twice a day. -Home health RN order placed. -BMP at next office visit. -Follow-up in clinic after the holidays.  Pyelonephritis Post discharge, he has been doing well.  Denies abdominal pain or fevers.  Endorses completion of 4 days of Cipro on 500 mg twice daily postdischarge. He has follow-up with urology scheduled for January 2025.   Lung nodule seen on imaging study  New nodule noted on admission CT scan, 9 x 6 mm subsolid right upper lobe, with recommendation to repeat in 3-6 months. -CT of the chest  in 3 months (March - June 2025)  Ascending aorta dilatation [facility])  Ascending thoracic aorta measures 4.1 cm, annual CTA or MRA recommended.   Laretta Bolster, MD [facility], PGY-1 Phone: [phone] Date 03/06/2023 Time 2:49 PM

## 2023-03-09 DIAGNOSIS — I5032 Chronic diastolic (congestive) heart failure: Secondary | ICD-10-CM | POA: Diagnosis not present

## 2023-03-09 DIAGNOSIS — I509 Heart failure, unspecified: Secondary | ICD-10-CM | POA: Diagnosis not present

## 2023-03-13 NOTE — Progress Notes (Signed)
Patient had Sepsis due to UTI and pyelonephritis, present on admission

## 2023-03-17 NOTE — Progress Notes (Signed)
Internal Medicine Clinic Attending  I was physically present during the key portions of the resident provided service and participated in the medical decision making of patient's management care. I reviewed pertinent patient test results.  The assessment, diagnosis, and plan were formulated together and I agree with the documentation in the resident's note.  Williams, Julie Anne, MD  

## 2023-03-26 ENCOUNTER — Encounter: Payer: Medicaid Other | Admitting: Student

## 2023-04-09 DIAGNOSIS — I509 Heart failure, unspecified: Secondary | ICD-10-CM | POA: Diagnosis not present

## 2023-04-09 DIAGNOSIS — I5032 Chronic diastolic (congestive) heart failure: Secondary | ICD-10-CM | POA: Diagnosis not present

## 2023-04-15 DIAGNOSIS — N200 Calculus of kidney: Secondary | ICD-10-CM | POA: Diagnosis not present

## 2023-05-08 ENCOUNTER — Ambulatory Visit (INDEPENDENT_AMBULATORY_CARE_PROVIDER_SITE_OTHER): Payer: Medicaid Other | Admitting: Student

## 2023-05-08 ENCOUNTER — Encounter: Payer: Self-pay | Admitting: Student

## 2023-05-08 VITALS — BP 133/61 | HR 59 | Temp 98.1°F | Ht 71.0 in | Wt 313.0 lb

## 2023-05-08 DIAGNOSIS — E876 Hypokalemia: Secondary | ICD-10-CM | POA: Diagnosis not present

## 2023-05-08 DIAGNOSIS — R918 Other nonspecific abnormal finding of lung field: Secondary | ICD-10-CM

## 2023-05-08 DIAGNOSIS — J449 Chronic obstructive pulmonary disease, unspecified: Secondary | ICD-10-CM

## 2023-05-08 DIAGNOSIS — I4821 Permanent atrial fibrillation: Secondary | ICD-10-CM | POA: Diagnosis not present

## 2023-05-08 DIAGNOSIS — H109 Unspecified conjunctivitis: Secondary | ICD-10-CM

## 2023-05-08 DIAGNOSIS — R590 Localized enlarged lymph nodes: Secondary | ICD-10-CM

## 2023-05-08 DIAGNOSIS — F17210 Nicotine dependence, cigarettes, uncomplicated: Secondary | ICD-10-CM | POA: Diagnosis not present

## 2023-05-08 DIAGNOSIS — I5033 Acute on chronic diastolic (congestive) heart failure: Secondary | ICD-10-CM | POA: Diagnosis present

## 2023-05-08 DIAGNOSIS — F17200 Nicotine dependence, unspecified, uncomplicated: Secondary | ICD-10-CM

## 2023-05-08 MED ORDER — ERYTHROMYCIN 5 MG/GM OP OINT: 1.0000 | TOPICAL_OINTMENT | Freq: Every day | OPHTHALMIC | 1 refills | Status: DC

## 2023-05-08 NOTE — Assessment & Plan Note (Signed)
Recent CT abdomen showed abdominal lymphadenopathy of unclear etiology.  I will check a CBC with differential today.

## 2023-05-08 NOTE — Assessment & Plan Note (Signed)
History of atrial fibrillation.  No lightheadedness, dizziness, chest palpitations recently.  He states he is taking his Xarelto regularly and has not missed any doses.  He is also taking Cardizem 240 mg daily for rate control.  Will rate is controlled on exam today.  Stable.  Continue current medications.

## 2023-05-08 NOTE — Assessment & Plan Note (Signed)
Recent CT chest showed abnormal lung nodule 9 mm x 6 mm, recommended 77-month follow-up.  Order placed for noncontrast CT chest in May to follow this nodule.

## 2023-05-08 NOTE — Assessment & Plan Note (Signed)
Continues to smoke an average of 3 cigarettes daily.  He knows he needs to quit but seems to be in the precontemplation stage of change.  Told him to let us know if he ever wants help with medication assistance.  Continue to encourage tobacco cessation at his next visit.

## 2023-05-08 NOTE — Assessment & Plan Note (Addendum)
Known history of congestive heart failure with preserved ejection fraction (55-60% on 2024 Echo).  He has been taking torsemide 20 mg once a day and metolazone 2.5 weekly on Wednesdays.  He is stable on 3 L of oxygen at home.  No new dyspnea or orthopnea.    Weight at his last office visit was 317 pounds, down to 313 today.  He is overall euvolemic on exam.  Lungs are relatively clear, no crackles.  No significant lower extremity edema.  No JVD appreciated.  We will continue his current diuresis regimen as listed above.

## 2023-05-08 NOTE — Progress Notes (Signed)
CC: Routine Follow Up for CHF, conjunctivitis after last office visit 03/06/2023  HPI:  Frank Morales is a 63 y.o. male with pertinent PMH of CHF, HTN, COPD, Afib on [facility], who presents to the clinic for routine f/u. Please see assessment and plan below for further details.  Review of Systems:   Pertinent items noted in HPI and/or A&P.  Physical Exam:  Vitals:   05/08/23 1334  BP: 133/61  Pulse: (!) 59  Temp: 98.1 F (36.7 C)  TempSrc: Oral  SpO2: 100%  Weight: (!) 313 lb (142 kg)  Height: 5\' 11"  (1.803 m)   Chronically ill-appearing and in no acute distress Sitting in a wheelchair with 3 L nasal cannula at rest Normal respiratory rate and effort, mild to moderate wheezes on the left, right lung clear to auscultation Heart rate regular, irregular rhythm, no murmurs, no JVD or lower extremity edema appreciated on exam; euvolemic Abdomen is soft, distended, nontender, seems to be a patient baseline Alert and oriented, no focal sensory deficits  Assessment & Plan:   Acute on chronic heart failure with preserved ejection fraction (HCC) Known history of congestive heart failure with preserved ejection fraction (55-60% on 2024 Echo).  He has been taking torsemide 20 mg once a day and metolazone 2.5 weekly on Wednesdays.  He is stable on 3 L of oxygen at home.  No new dyspnea or orthopnea.    Weight at his last office visit was 317 pounds, down to 313 today.  He is overall euvolemic on exam.  Lungs are relatively clear, no crackles.  No significant lower extremity edema.  No JVD appreciated.  We will continue his current diuresis regimen as listed above.  COPD (chronic obstructive pulmonary disease) (HCC) History of COPD on home oxygen.  Continues to use tobacco regularly.  Regimen includes Flovent, albuterol, Anoro Ellipta , which he states he has been adherent to.  He is satting well on room air, no reported shortness of breath or coughing beyond baseline.  Mild wheezes on  the left and coughing with deep inspiration on exam.    Patient overall seems stable and at his baseline.  We will continue with his current regimen as listed above.  Bacterial conjunctivitis He was previously seen in November for concerns of purulent bilateral eye drainage.  At that time he was given polymyxin trimethoprim eyedrops, but he has not had any improvement since then.  His eyes continue to have gray/brown crusty drainage, especially in the morning when he wakes up.  He also notes some itching of his eyes.  There is erythema of his conjunctiva bilaterally.  He does not note any pain or systemic infectious symptoms.  There is no tenderness or swelling around the eyes or tear ducts.  I do not appreciate any drainage currently, although there is crusting in the medial aspects of his eye near the tear duct.  I will trial him on erythromycin ointment.  I have told him that if his symptoms do not improve in 7 days he should call our clinic.  His symptoms have overlap with bacterial/allergic/viral conjunctivitis.  If erythromycin does not help, he may find relief from antihistamine or saline drops for symptomatic relief.  Tobacco use disorder Continues to smoke an average of 3 cigarettes daily.  He knows he needs to quit but seems to be in the precontemplation stage of change.  Told him to let us know if he ever wants help with medication assistance.  Continue to encourage  tobacco cessation at his next visit.  Abnormal CT scan of lung Recent CT chest showed abnormal lung nodule 9 mm x 6 mm, recommended 82-month follow-up.  Order placed for noncontrast CT chest in May to follow this nodule.  Abdominal lymphadenopathy Recent CT abdomen showed abdominal lymphadenopathy of unclear etiology.  I will check a CBC with differential today.   Permanent atrial fibrillation History of atrial fibrillation.  No lightheadedness, dizziness, chest palpitations recently.  He states he is taking his Xarelto  regularly and has not missed any doses.  He is also taking Cardizem 240 mg daily for rate control.  Will rate is controlled on exam today.  Stable.  Continue current medications.   Patient discussed with Dr. Sharilyn Sites, MD [facility] Internal Medicine Resident PGY-1 Clinic Phone: [phone] Pager: [phone]

## 2023-05-08 NOTE — Patient Instructions (Signed)
Thank you, Mr.Madsen Riddle Varnell for allowing [place] to provide your care today. Today we discussed your eyes, tobacco use, and heart failure.    For your eyes, I have prescribed an antibiotic ointment called erythromycin.  Apply this up to 4 times daily for the next 7 days.  If your symptoms do not improve, please give our office a call and we will we try something else.  As we discussed, I recommend continuing to work on reducing your tobacco use.  This is the best thing you could do for your health at this time to reduce your risk of complications from congestive heart failure and COPD.  You may keep taking your heart medications as prescribed and using your inhalers regularly.  Please follow-up with your cardiologist.  I have ordered the following labs for you:  Lab Orders         CBC with Diff         BMP8+Anion Gap         TSH      I have ordered the following medication/changed the following medications:  Start the following medications: Meds ordered this encounter  Medications   erythromycin ophthalmic ointment    Sig: Place 1 Application into both eyes at bedtime.    Dispense:  3.5 g    Refill:  1     Follow up: 3 months    We look forward to seeing you next time. Please call our clinic at [phone] if you have any questions or concerns. The best time to call is Monday-Friday from 9am-4pm, but there is someone available 24/7. If after hours or the weekend, call the main hospital number and ask for the Internal Medicine Resident On-Call. If you need medication refills, please notify your pharmacy one week in advance and they will send [place] a request.   Thank you for trusting me with your care. Wishing you the best!   Annett Fabian, MD [facility]

## 2023-05-08 NOTE — Assessment & Plan Note (Addendum)
He was previously seen in November for concerns of purulent bilateral eye drainage.  At that time he was given polymyxin trimethoprim eyedrops, but he has not had any improvement since then.  His eyes continue to have gray/brown crusty drainage, especially in the morning when he wakes up.  He also notes some itching of his eyes.  There is erythema of his conjunctiva bilaterally.  He does not note any pain or systemic infectious symptoms.  There is no tenderness or swelling around the eyes or tear ducts.  I do not appreciate any drainage currently, although there is crusting in the medial aspects of his eye near the tear duct.  I will trial him on erythromycin ointment.  I have told him that if his symptoms do not improve in 7 days he should call our clinic.  His symptoms have overlap with bacterial/allergic/viral conjunctivitis.  If erythromycin does not help, he may find relief from antihistamine or saline drops for symptomatic relief.

## 2023-05-08 NOTE — Progress Notes (Signed)
 Internal Medicine Clinic Attending  Case discussed with the resident at the time of the visit.  We reviewed the resident's history and exam and pertinent patient test results.  I agree with the assessment, diagnosis, and plan of care documented in the resident's note.

## 2023-05-08 NOTE — Assessment & Plan Note (Addendum)
History of COPD on home oxygen.  Continues to use tobacco regularly.  Regimen includes Flovent, albuterol, Anoro Ellipta , which he states he has been adherent to.  He is satting well on room air, no reported shortness of breath or coughing beyond baseline.  Mild wheezes on the left and coughing with deep inspiration on exam.    Patient overall seems stable and at his baseline.  We will continue with his current regimen as listed above.

## 2023-05-09 LAB — CBC WITH DIFFERENTIAL/PLATELET
Basophils Absolute: 0.1 10*3/uL (ref 0.0–0.2)
Basos: 1 %
EOS (ABSOLUTE): 0.2 10*3/uL (ref 0.0–0.4)
Eos: 3 %
Hematocrit: 30.4 % — ABNORMAL LOW (ref 37.5–51.0)
Hemoglobin: 9.6 g/dL — ABNORMAL LOW (ref 13.0–17.7)
Immature Grans (Abs): 0 10*3/uL (ref 0.0–0.1)
Immature Granulocytes: 0 %
Lymphocytes Absolute: 1.6 10*3/uL (ref 0.7–3.1)
Lymphs: 30 %
MCH: 24.7 pg — ABNORMAL LOW (ref 26.6–33.0)
MCHC: 31.6 g/dL (ref 31.5–35.7)
MCV: 78 fL — ABNORMAL LOW (ref 79–97)
Monocytes Absolute: 0.9 10*3/uL (ref 0.1–0.9)
Monocytes: 17 %
Neutrophils Absolute: 2.5 10*3/uL (ref 1.4–7.0)
Neutrophils: 49 %
Platelets: 235 10*3/uL (ref 150–450)
RBC: 3.89 x10E6/uL — ABNORMAL LOW (ref 4.14–5.80)
RDW: 21.5 % — ABNORMAL HIGH (ref 11.6–15.4)
WBC: 5.3 10*3/uL (ref 3.4–10.8)

## 2023-05-09 LAB — BMP8+ANION GAP
Anion Gap: 8 mmol/L — ABNORMAL LOW (ref 10.0–18.0)
BUN/Creatinine Ratio: 17 (ref 10–24)
BUN: 15 mg/dL (ref 8–27)
CO2: 31 mmol/L — ABNORMAL HIGH (ref 20–29)
Calcium: 8.8 mg/dL (ref 8.6–10.2)
Chloride: 94 mmol/L — ABNORMAL LOW (ref 96–106)
Creatinine, Ser: 0.87 mg/dL (ref 0.76–1.27)
Glucose: 84 mg/dL (ref 70–99)
Potassium: 4.5 mmol/L (ref 3.5–5.2)
Sodium: 133 mmol/L — ABNORMAL LOW (ref 134–144)
eGFR: 98 mL/min/{1.73_m2} (ref >59)

## 2023-05-09 LAB — TSH: TSH: 2.36 u[IU]/mL (ref 0.450–4.500)

## 2023-05-10 DIAGNOSIS — I5032 Chronic diastolic (congestive) heart failure: Secondary | ICD-10-CM | POA: Diagnosis not present

## 2023-05-10 DIAGNOSIS — I509 Heart failure, unspecified: Secondary | ICD-10-CM | POA: Diagnosis not present

## 2023-05-13 ENCOUNTER — Other Ambulatory Visit: Payer: Self-pay | Admitting: Student

## 2023-05-13 ENCOUNTER — Other Ambulatory Visit: Payer: Self-pay | Admitting: Adult Health

## 2023-05-13 DIAGNOSIS — I5032 Chronic diastolic (congestive) heart failure: Secondary | ICD-10-CM

## 2023-05-13 DIAGNOSIS — J449 Chronic obstructive pulmonary disease, unspecified: Secondary | ICD-10-CM

## 2023-05-14 ENCOUNTER — Telehealth: Payer: Self-pay | Admitting: Student

## 2023-05-14 DIAGNOSIS — E876 Hypokalemia: Secondary | ICD-10-CM

## 2023-05-14 DIAGNOSIS — I5032 Chronic diastolic (congestive) heart failure: Secondary | ICD-10-CM

## 2023-05-14 NOTE — Telephone Encounter (Signed)
  potassium chloride SA (KLOR-CON M) 20 MEQ tablet   [id number] - [place], [place] - [address] (Ph: [phone])

## 2023-05-15 MED ORDER — POTASSIUM CHLORIDE CRYS ER 20 MEQ PO TBCR: 40.0000 meq | EXTENDED_RELEASE_TABLET | Freq: Every day | ORAL | 2 refills | Status: AC

## 2023-05-15 NOTE — Telephone Encounter (Signed)
 Medication sent to pharmacy

## 2023-05-21 ENCOUNTER — Encounter: Payer: Self-pay | Admitting: Student

## 2023-06-07 DIAGNOSIS — I5032 Chronic diastolic (congestive) heart failure: Secondary | ICD-10-CM | POA: Diagnosis not present

## 2023-06-07 DIAGNOSIS — I509 Heart failure, unspecified: Secondary | ICD-10-CM | POA: Diagnosis not present

## 2023-06-11 ENCOUNTER — Ambulatory Visit (INDEPENDENT_AMBULATORY_CARE_PROVIDER_SITE_OTHER): Payer: Medicaid Other | Admitting: Allergy

## 2023-06-11 ENCOUNTER — Encounter: Payer: Self-pay | Admitting: Allergy

## 2023-06-11 ENCOUNTER — Other Ambulatory Visit: Payer: Self-pay

## 2023-06-11 VITALS — BP 100/60 | HR 90 | Temp 97.8°F | Resp 21

## 2023-06-11 DIAGNOSIS — H1013 Acute atopic conjunctivitis, bilateral: Secondary | ICD-10-CM | POA: Diagnosis not present

## 2023-06-11 DIAGNOSIS — J3089 Other allergic rhinitis: Secondary | ICD-10-CM

## 2023-06-11 DIAGNOSIS — T781XXD Other adverse food reactions, not elsewhere classified, subsequent encounter: Secondary | ICD-10-CM | POA: Diagnosis not present

## 2023-06-11 DIAGNOSIS — J302 Other seasonal allergic rhinitis: Secondary | ICD-10-CM | POA: Diagnosis not present

## 2023-06-11 MED ORDER — CROMOLYN SODIUM 4 % OP SOLN: 1.0000 [drp] | Freq: Four times a day (QID) | OPHTHALMIC | 11 refills | Status: DC | PRN

## 2023-06-11 MED ORDER — AZELASTINE-FLUTICASONE 137-50 MCG/ACT NA SUSP: 1.0000 | Freq: Two times a day (BID) | NASAL | 11 refills | Status: AC | PRN

## 2023-06-11 MED ORDER — LEVOCETIRIZINE DIHYDROCHLORIDE 5 MG PO TABS: 5.0000 mg | ORAL_TABLET | Freq: Every evening | ORAL | 11 refills | Status: DC

## 2023-06-11 NOTE — Progress Notes (Signed)
 Follow-up Note  RE: Frank Morales MRN: [id number] DOB: [birth year] Date of Office Visit: 06/11/2023  History of present illness: Frank Morales is a 63 y.o. male presenting today for follow-up of allergic rhinitis and adverse food reaction.  He was last seen in the office on 12/12/2022 by myself. Discussed the use of AI scribe software for clinical note transcription with the patient, who gave verbal consent to proceed.  He is experiencing worsening allergy symptoms during the current pollen season, including sneezing, itchy throat, runny nose, and nasal congestion. These symptoms have been more pronounced this year, affecting his daily activities.  His eyes are significantly affected, with symptoms of crusting and difficulty opening them upon waking due to buildup. He has used eye drops in the past and was recently prescribed a topical antibiotic for suspected bacterial conjunctivitis, although he has not had pink eye.  He states that has been difficult to put the ribbon of ointment on the lid.  Regarding nasal symptoms, he recalls using Flonase for congestion and azelastine for a runny nose in the past. He is currently not using any nasal sprays.Marland Kitchen He is not currently taking any antihistamines and is unsure if he ran out or was taken off levocetirizine, which was previously recommended.  He avoids dairy products, particularly milk, due to a long-standing intolerance since childhood.  He continues using oxygen currently 2 L [place] at this time, which is an improvement from previous 3-4L at rest. He requires higher oxygen levels when engaging in physical activity.     Review of systems: 10pt ROS negative unless noted above in HPI  Past medical/social/surgical/family history have been reviewed and are unchanged unless specifically indicated below.  No changes  Medication List: Current Outpatient Medications  Medication Sig Dispense Refill   albuterol (VENTOLIN HFA) 108 (90 Base)  MCG/ACT inhaler Inhale 2 puffs into the lungs every 6 (six) hours as needed for wheezing or shortness of breath. 8 g 2   ANORO ELLIPTA 62.5-25 MCG/ACT AEPB INHALE 1 PUFF INTO THE LUNGS DAILY 60 each 2   azelastine (ASTELIN) 0.1 % nasal spray 2 sprays each nostril twice a day as needed 90 mL 1   dapagliflozin propanediol (FARXIGA) 10 MG TABS tablet TAKE 1 TABLET(10 MG) BY MOUTH DAILY BEFORE BREAKFAST 90 tablet 3   diltiazem (CARDIZEM CD) 240 MG 24 hr capsule TAKE 2 CAPSULES(480 MG) BY MOUTH DAILY 180 capsule 3   erythromycin ophthalmic ointment Place 1 Application into both eyes at bedtime. 3.5 g 1   fluticasone (FLONASE) 50 MCG/ACT nasal spray 2 sprays each nostril twice a day as needed 36 g 1   fluticasone (FLOVENT HFA) 110 MCG/ACT inhaler Inhale 1 puff into the lungs daily. 12 g 12   levocetirizine (XYZAL) 5 MG tablet Take 5 mg by mouth every evening.     metolazone (ZAROXOLYN) 2.5 MG tablet Take 1 tablet (2.5 mg total) by mouth once a week. Take every Wednesday. 30 tablet 1   potassium chloride SA (KLOR-CON M) 20 MEQ tablet Take 2 tablets (40 mEq total) by mouth daily. 120 tablet 2   rivaroxaban (XARELTO) 20 MG TABS tablet Take 1 tablet (20 mg total) by mouth daily with supper. 90 tablet 2   torsemide (DEMADEX) 20 MG tablet Take 1 tablet (20 mg total) by mouth daily. 90 tablet 3   No current facility-administered medications for this visit.     Known medication allergies: Allergies  Allergen Reactions   Lactose Intolerance (Gi) Nausea And  Vomiting     Physical examination: Blood pressure 100/60, pulse 90, temperature 97.8 F (36.6 C), temperature source Temporal, resp. rate (!) 21, SpO2 96%.  General: Alert, interactive, in no acute distress. HEENT: PERRLA, TMs pearly gray, turbinates mildly edematous without discharge, post-pharynx non erythematous. Neck: Supple without lymphadenopathy. Lungs: Clear to auscultation without wheezing, rhonchi or rales. {no increased work of  breathing. CV: Normal S1, S2 without murmurs. Abdomen: Nondistended, nontender. Skin: Warm and dry, without lesions or rashes. Extremities:  No clubbing, cyanosis or edema. Neuro:   Grossly intact.  Diagnositics/Labs: None today  Assessment and plan:   Chronic rhinitis with conjunctivitis - Continue avoidance measures for grass pollen, tree pollen, weed pollen, dust mites and cockroach.  - resume Xyzal 5mg  daily.   This is your antihistamine tablet - stop Flonase and Astelin as combining into 1 spray called Dymista Use Dymista 1 spray each nostril twice a day as needed for runny or stuffy nose control.  With using nasal sprays point tip of bottle toward eye on same side nostril and lean head slightly forward for best technique.   - use Cromolyn 1 drop each eye up to 4 times a day as needed for itchy/watery eyes. - perform nasal steam treatments (warm towel) at night and then follow-up with nasal spray.   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  Food allergy could lead to life threatening symptoms  Sensitivity: occurs when you have IgE to a food (positive food testing either by skin testing or blood testing) but is a food you eat without any issues.  This is not an allergy and we recommend keeping the food in the diet  Intolerance: this is when you have negative testing by either skin testing or blood testing thus not allergic but the food causes symptoms (like belly pain, bloating, diarrhea etc) with ingestion.  These foods should be avoided to prevent symptoms.     Follow-up in 6-12 months or sooner if needed  I appreciate the opportunity to take part in [name]'s care. Please do not hesitate to contact me  with questions.  Sincerely,   Margo Aye, MD Allergy/Immunology [facility]

## 2023-06-11 NOTE — Patient Instructions (Addendum)
 Chronic rhinitis with conjunctivitis - Continue avoidance measures for grass pollen, tree pollen, weed pollen, dust mites and cockroach.  - resume Xyzal 5mg  daily.   This is your antihistamine tablet - stop Flonase and Astelin as combining into 1 spray called Dymista Use Dymista 1 spray each nostril twice a day as needed for runny or stuffy nose control.  With using nasal sprays point tip of bottle toward eye on same side nostril and lean head slightly forward for best technique.   - use Cromolyn 1 drop each eye up to 4 times a day as needed for itchy/watery eyes. - perform nasal steam treatments (warm towel) at night and then follow-up with nasal spray.   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  Food allergy could lead to life threatening symptoms  Sensitivity: occurs when you have IgE to a food (positive food testing either by skin testing or blood testing) but is a food you eat without any issues.  This is not an allergy and we recommend keeping the food in the diet  Intolerance: this is when you have negative testing by either skin testing or blood testing thus not allergic but the food causes symptoms (like belly pain, bloating, diarrhea etc) with ingestion.  These foods should be avoided to prevent symptoms.     Follow-up in 6-12 months or sooner if needed

## 2023-06-17 NOTE — Progress Notes (Deleted)
 Cardiology Office Note    Patient Name: Frank Morales Date of Encounter: 06/17/2023  Primary Care Provider:  Olegario Messier, MD Primary Cardiologist:  Dietrich Pates, MD Primary Electrophysiologist: None   Past Medical History    Past Medical History:  Diagnosis Date   Acute exacerbation of CHF (congestive heart failure) (HCC) 12/15/2019   Acute heart failure (HCC) 12/16/2019   Anemia    Angio-edema    Arthritis    Atrial fibrillation and flutter (HCC) 08/17/2015   A. S/p failed DCCV // b. Severe BAE on echo >> rate control strategy (has seen AF clinic) // c. Xarelto for anticoag (CHADS2-VASc=2 / CHF, HTN)   Chronic diastolic CHF (congestive heart failure) (HCC) 08/30/2015   A. Echo 6/17: Apical HK, moderate focal basal and mild concentric LVH, EF 50-55, diffuse HK, trivial MR, severe BAE, mild TR, PASP 37   COPD (chronic obstructive pulmonary disease) (HCC)    Dependence on continuous supplemental oxygen    3L   Dyspnea    Dysrhythmia    Afib   Hematuria 01/10/2021   Hepatic fibrosis 02/29/2016   F3/4 on elastography.    Hepatitis C, chronic (HCC) 08/19/2015   Hepatitis C, chronic (HCC) 08/19/2015   Treated in 2017 with SVR   History of cardiac catheterization    a. LHC 6/17: LAD irregs, o/w no CAD   History of kidney stones    Hypertension    Hyposmia 12/22/2017   OSA (obstructive sleep apnea) 11/21/2015   No Cpap   Oxygen dependent    Pleural effusion on right 03/14/2018   Pre-diabetes    Sleep apnea    Tobacco abuse     History of Present Illness  Frank Morales is a 63 y.o. male with a PMH of HFpEF, OSA, obesity, substance abuse (cocaine), ascending thoracic aorta, lung nodule, persistent AF/flutter, HTN, COPD who presents today for follow-up.  Frank Morales was seen initially in 2017 for complaint of DOE and CP and was found to be in atrial flutter.  He underwent an LHC that showed no significant CAD.  2D echo was completed showing EF of 50-55% and patient  was placed on Xarelto for [facility].  He underwent a DCCV on 09/2015 but did not convert to sinus rhythm.  He was evaluated by AF clinic and felt to be a good candidate for rate control strategy.  He had several admissions for CHF exacerbations and was admitted 10/01/2022 with septic shock due to pyelonephritis requiring mechanical ventilation.  He was evaluated by urology and was treated with broad-spectrum antibiotics as well as stent placed for kidney stone.  He was last seen by Dr. Tenny Craw on 02/18/2023 and was short of breath and denied any chest pain.  He was volume overloaded and recommended transfer to [facility], ED for further evaluation.  He was found to have a fever on admission and diagnosed with pyelonephritis as well as acute CHF.  He was treated with ceftriaxone and discharged with 7-day course of Cipro.  He was treated with IV Lasix and diuresed well with home regimen switched to torsemide 20 mg daily and metolazone 2.5 mg on Wednesday.   Patient denies chest pain, palpitations, dyspnea, PND, orthopnea, nausea, vomiting, dizziness, syncope, edema, weight gain, or early satiety.   Discussed the use of AI scribe software for clinical note transcription with the patient, who gave verbal consent to proceed.  History of Present Illness    ***Notes: -Last ischemic evaluation:  Review of Systems  Please  see the history of present illness.    All other systems reviewed and are otherwise negative except as noted above.  Physical Exam    Wt Readings from Last 3 Encounters:  05/08/23 (!) 313 lb (142 kg)  03/06/23 (!) 317 lb 11.2 oz (144.1 kg)  02/21/23 (!) 304 lb 8 oz (138.1 kg)   NF:AOZHY were no vitals filed for this visit.,There is no height or weight on file to calculate BMI. GEN: Well nourished, well developed in no acute distress Neck: No JVD; No carotid bruits Pulmonary: Clear to auscultation without rales, wheezing or rhonchi  Cardiovascular: Normal rate. Regular rhythm. Normal S1.  Normal S2.   Murmurs: There is no murmur.  ABDOMEN: Soft, non-tender, non-distended EXTREMITIES:  No edema; No deformity   EKG/LABS/ Recent Cardiac Studies   ECG personally reviewed by me today - ***  Risk Assessment/Calculations:   {Does this patient have ATRIAL FIBRILLATION?:[phone]}      Lab Results  Component Value Date   WBC 5.3 05/08/2023   HGB 9.6 (L) 05/08/2023   HCT 30.4 (L) 05/08/2023   MCV 78 (L) 05/08/2023   PLT 235 05/08/2023   Lab Results  Component Value Date   CREATININE 0.87 05/08/2023   BUN 15 05/08/2023   NA 133 (L) 05/08/2023   K 4.5 05/08/2023   CL 94 (L) 05/08/2023   CO2 31 (H) 05/08/2023   Lab Results  Component Value Date   CHOL 154 02/06/2023   HDL 58 02/06/2023   LDLCALC 80 02/06/2023   TRIG 88 02/06/2023   CHOLHDL 2.7 02/06/2023    Lab Results  Component Value Date   HGBA1C 6.3 (H) 10/02/2022   Assessment & Plan    1.  HFpEF: -2D echo completed on 08/08/2022 showing normal LV function with moderate LVH -Today patient reports***  2.  Permanent AF: -The patient is***  3.  Aortic dilation: -2D echo completed 07/2022 showing mild aortic dilation of 43 mm  4.  History of COPD: -Continue current treatment plan per pulmonology  5.  Essential hypertension      Disposition: Follow-up with Dietrich Pates, MD or APP in *** months {Are you ordering a CV Procedure (e.g. stress test, cath, DCCV, TEE, etc)?   Press F2        :[id number]}   Signed, Napoleon Form, Leodis Rains, NP 06/17/2023, 6:57 PM [facility]

## 2023-06-18 ENCOUNTER — Ambulatory Visit: Payer: Medicaid Other | Attending: Nurse Practitioner | Admitting: Nurse Practitioner

## 2023-06-18 DIAGNOSIS — I4821 Permanent atrial fibrillation: Secondary | ICD-10-CM

## 2023-06-18 DIAGNOSIS — I10 Essential (primary) hypertension: Secondary | ICD-10-CM

## 2023-06-18 DIAGNOSIS — I5030 Unspecified diastolic (congestive) heart failure: Secondary | ICD-10-CM

## 2023-06-18 DIAGNOSIS — J449 Chronic obstructive pulmonary disease, unspecified: Secondary | ICD-10-CM

## 2023-06-18 DIAGNOSIS — I77810 Thoracic aortic ectasia: Secondary | ICD-10-CM

## 2023-06-19 ENCOUNTER — Encounter: Payer: Self-pay | Admitting: Nurse Practitioner

## 2023-07-08 DIAGNOSIS — I5032 Chronic diastolic (congestive) heart failure: Secondary | ICD-10-CM | POA: Diagnosis not present

## 2023-07-08 DIAGNOSIS — I509 Heart failure, unspecified: Secondary | ICD-10-CM | POA: Diagnosis not present

## 2023-08-05 ENCOUNTER — Ambulatory Visit (INDEPENDENT_AMBULATORY_CARE_PROVIDER_SITE_OTHER): Payer: Medicaid Other | Admitting: Student

## 2023-08-05 ENCOUNTER — Encounter: Payer: Self-pay | Admitting: Student

## 2023-08-05 VITALS — BP 112/73 | HR 93 | Temp 98.4°F | Ht 71.0 in | Wt 295.2 lb

## 2023-08-05 DIAGNOSIS — J449 Chronic obstructive pulmonary disease, unspecified: Secondary | ICD-10-CM

## 2023-08-05 DIAGNOSIS — I4821 Permanent atrial fibrillation: Secondary | ICD-10-CM | POA: Diagnosis present

## 2023-08-05 DIAGNOSIS — I5032 Chronic diastolic (congestive) heart failure: Secondary | ICD-10-CM

## 2023-08-05 DIAGNOSIS — R110 Nausea: Secondary | ICD-10-CM

## 2023-08-05 MED ORDER — ONDANSETRON HCL 4 MG PO TABS: 4.0000 mg | ORAL_TABLET | Freq: Three times a day (TID) | ORAL | 0 refills | Status: AC | PRN

## 2023-08-05 MED ORDER — UMECLIDINIUM-VILANTEROL 62.5-25 MCG/ACT IN AEPB
1.0000 | INHALATION_SPRAY | Freq: Every day | RESPIRATORY_TRACT | 2 refills | Status: AC
Start: 2023-08-05 — End: ?

## 2023-08-05 NOTE — Patient Instructions (Signed)
.  JThank you, [name] for allowing us  to provide your care today.   Today we discussed :  Continue inhalers for COPD.  2.  Continue torsemide  20 mg daily and metolazone  every Wednesday for heart failure.  3.  Take Zofran  every 8 hours as needed for nausea.   I have ordered the following medication/changed the following medications:   Stop the following medications: There are no discontinued medications.   Start the following medications: Meds ordered this encounter  Medications   ondansetron  (ZOFRAN ) 4 MG tablet    Sig: Take 1 tablet (4 mg total) by mouth every 8 (eight) hours as needed for up to 7 days for nausea or vomiting.    Dispense:  21 tablet    Refill:  0     Follow up: 3 months    Should you have any questions or concerns please call the internal medicine clinic at [phone].    Marni Sins, MD  [facility]

## 2023-08-05 NOTE — Assessment & Plan Note (Addendum)
 Doing well, denies any cough or worsening dyspnea.  He is on 3 L of home oxygen , satting normal.  He is on Flovent , albuterol , Anoro Ellipta , endorses compliance. Plan: Continue current regimen as listed above.

## 2023-08-05 NOTE — Assessment & Plan Note (Addendum)
 Clinically euvolemic, he denies worsening dyspnea or orthopnea.  He has been compliant with torsemide  20 mg daily and metolazone  2.5 mg on Wednesday.  He has upcoming appointment with cardiology week of 5/27. - Continue torsemide  20 mg and metolazone  2.5 on Wednesdays.

## 2023-08-05 NOTE — Progress Notes (Signed)
 CC: Follow-up on chronic medical conditions.  HPI:  [name] is a 63 y.o. male living with a history stated below and presents today for follow-up on chronic medical conditions..   Please see problem based assessment and plan for additional details.  Past Medical History:  Diagnosis Date   Acute exacerbation of CHF (congestive heart failure) (HCC) 12/15/2019   Acute heart failure (HCC) 12/16/2019   Anemia    Angio-edema    Arthritis    Atrial fibrillation and flutter (HCC) 08/17/2015   A. S/p failed DCCV // b. Severe BAE on echo >> rate control strategy (has seen AF clinic) // c. Xarelto  for anticoag (CHADS2-VASc=2 / CHF, HTN)   Chronic diastolic CHF (congestive heart failure) (HCC) 08/30/2015   A. Echo 6/17: Apical HK, moderate focal basal and mild concentric LVH, EF 50-55, diffuse HK, trivial MR, severe BAE, mild TR, PASP 37   COPD (chronic obstructive pulmonary disease) (HCC)    Dependence on continuous supplemental oxygen     3L   Dyspnea    Dysrhythmia    Afib   Hematuria 01/10/2021   Hepatic fibrosis 02/29/2016   F3/4 on elastography.    Hepatitis C, chronic (HCC) 08/19/2015   Hepatitis C, chronic (HCC) 08/19/2015   Treated in 2017 with SVR   History of cardiac catheterization    a. LHC 6/17: LAD irregs, o/w no CAD   History of kidney stones    Hypertension    Hyposmia 12/22/2017   OSA (obstructive sleep apnea) 11/21/2015   No Cpap   Oxygen  dependent    Pleural effusion on right 03/14/2018   Pre-diabetes    Sleep apnea    Tobacco abuse     Current Outpatient Medications on File Prior to Visit  Medication Sig Dispense Refill   albuterol  (VENTOLIN  HFA) 108 (90 Base) MCG/ACT inhaler Inhale 2 puffs into the lungs every 6 (six) hours as needed for wheezing or shortness of breath. 8 g 2   azelastine  (ASTELIN ) 0.1 % nasal spray 2 sprays each nostril twice a day as needed 90 mL 1   Azelastine -Fluticasone  137-50 MCG/ACT SUSP Place 1 spray into the nose 2  (two) times daily as needed (Runny or stuffy nose). 23 g 11   cromolyn  (OPTICROM ) 4 % ophthalmic solution Place 1 drop into both eyes 4 (four) times daily as needed (Itchy watery eyes). 10 mL 11   dapagliflozin  propanediol (FARXIGA ) 10 MG TABS tablet TAKE 1 TABLET(10 MG) BY MOUTH DAILY BEFORE BREAKFAST 90 tablet 3   diltiazem  (CARDIZEM  CD) 240 MG 24 hr capsule TAKE 2 CAPSULES(480 MG) BY MOUTH DAILY 180 capsule 3   erythromycin  ophthalmic ointment Place 1 Application into both eyes at bedtime. 3.5 g 1   fluticasone  (FLONASE ) 50 MCG/ACT nasal spray 2 sprays each nostril twice a day as needed 36 g 1   fluticasone  (FLOVENT  HFA) 110 MCG/ACT inhaler Inhale 1 puff into the lungs daily. 12 g 12   levocetirizine (XYZAL ) 5 MG tablet Take 1 tablet (5 mg total) by mouth every evening. 30 tablet 11   metolazone  (ZAROXOLYN ) 2.5 MG tablet Take 1 tablet (2.5 mg total) by mouth once a week. Take every Wednesday. 30 tablet 1   potassium chloride  SA (KLOR-CON  M) 20 MEQ tablet Take 2 tablets (40 mEq total) by mouth daily. 120 tablet 2   rivaroxaban  (XARELTO ) 20 MG TABS tablet Take 1 tablet (20 mg total) by mouth daily with supper. 90 tablet 2   torsemide  (DEMADEX ) 20 MG tablet  Take 1 tablet (20 mg total) by mouth daily. 90 tablet 3   No current facility-administered medications on file prior to visit.     Review of Systems: ROS negative except for what is noted on the assessment and plan.  Vitals:   08/05/23 1103  BP: 112/73  Pulse: 93  Temp: 98.4 F (36.9 C)  TempSrc: Oral  SpO2: 100%  Weight: 295 lb 3.2 oz (133.9 kg)  Height: 5\' 11"  (1.803 m)     Physical Exam: Constitutional: Not in acute distress. Cardiovascular: regular rate and rhythm, no m/r/g Pulmonary/Chest: Clear lungs bilaterally.  No wheezes.  Assessment & Plan:   Patient discussed with Dr. Lanetta Pion  Chronic diastolic CHF (congestive heart failure) (HCC) Clinically euvolemic, he denies worsening dyspnea or orthopnea.  He has been  compliant with torsemide  20 mg daily and metolazone  2.5 mg on Wednesday.  He has upcoming appointment with cardiology week of 5/27. - Continue torsemide  20 mg and metolazone  2.5 on Wednesdays.  COPD (chronic obstructive pulmonary disease) (HCC) Doing well, denies any cough or worsening dyspnea.  He is on 3 L of home oxygen , satting normal.  He is on Flovent , albuterol , Anoro Ellipta , endorses compliance. Plan: Continue current regimen as listed above.  Permanent atrial fibrillation Stable, normal heart rate.  Denies any chest pain or dizziness.  He is on diltiazem  to 240 mg, rivaroxaban  20 mg. Plan - Continue treatment as above.  Marni Sins, MD [facility], PGY-1 Pager: [phone] Date 08/06/2023 Time 9:02 AM

## 2023-08-05 NOTE — Assessment & Plan Note (Addendum)
 Stable, normal heart rate.  Denies any chest pain or dizziness.  He is on diltiazem  to 240 mg, rivaroxaban  20 mg. Plan - Continue diltiazem  to 40 mg, and rivaroxaban  20 mg.

## 2023-08-07 DIAGNOSIS — I5032 Chronic diastolic (congestive) heart failure: Secondary | ICD-10-CM | POA: Diagnosis not present

## 2023-08-07 DIAGNOSIS — I509 Heart failure, unspecified: Secondary | ICD-10-CM | POA: Diagnosis not present

## 2023-08-18 NOTE — Progress Notes (Signed)
 Internal Medicine Clinic Attending  Case discussed with the resident at the time of the visit.  We reviewed the resident's history and exam and pertinent patient test results.  I agree with the assessment, diagnosis, and plan of care documented in the resident's note.

## 2023-09-04 NOTE — Progress Notes (Unsigned)
 Cardiology Office Note    Patient Name: Frank Morales Date of Encounter: 09/04/2023  Primary Care Provider:  Nooruddin, Saad, MD Primary Cardiologist:  Frank Berger, MD Primary Electrophysiologist: None   Past Medical History    Past Medical History:  Diagnosis Date   Acute exacerbation of CHF (congestive heart failure) (HCC) 12/15/2019   Acute heart failure (HCC) 12/16/2019   Anemia    Angio-edema    Arthritis    Atrial fibrillation and flutter (HCC) 08/17/2015   A. S/p failed DCCV // b. Severe BAE on echo >> rate control strategy (has seen AF clinic) // c. Xarelto  for anticoag (CHADS2-VASc=2 / CHF, HTN)   Chronic diastolic CHF (congestive heart failure) (HCC) 08/30/2015   A. Echo 6/17: Apical HK, moderate focal basal and mild concentric LVH, EF 50-55, diffuse HK, trivial MR, severe BAE, mild TR, PASP 37   COPD (chronic obstructive pulmonary disease) (HCC)    Dependence on continuous supplemental oxygen     3L   Dyspnea    Dysrhythmia    Afib   Hematuria 01/10/2021   Hepatic fibrosis 02/29/2016   F3/4 on elastography.    Hepatitis C, chronic (HCC) 08/19/2015   Hepatitis C, chronic (HCC) 08/19/2015   Treated in 2017 with SVR   History of cardiac catheterization    a. LHC 6/17: LAD irregs, o/w no CAD   History of kidney stones    Hypertension    Hyposmia 12/22/2017   OSA (obstructive sleep apnea) 11/21/2015   No Cpap   Oxygen  dependent    Pleural effusion on right 03/14/2018   Pre-diabetes    Sleep apnea    Tobacco abuse     History of Present Illness  Frank Morales is a 63 y.o. male with a PMH of HFpEF, OSA, obesity, substance abuse (cocaine), ascending thoracic aorta, lung nodule, persistent AF/flutter, HTN, COPD who presents today for follow-up.   [name] was seen initially in 2017 for complaint of DOE and CP and was found to be in atrial flutter.  He underwent an LHC that showed no significant CAD.  2D echo was completed showing EF of 50-55% and patient  was placed on Xarelto  for [facility].  He underwent a DCCV on 09/2015 but did not convert to sinus rhythm.  He was evaluated by AF clinic and felt to be a good candidate for rate control strategy.  He had several admissions for CHF exacerbations and was admitted 10/01/2022 with septic shock due to pyelonephritis requiring mechanical ventilation.  He was evaluated by urology and was treated with broad-spectrum antibiotics as well as stent placed for kidney stone.  He was last seen by Dr. Avanell Morales on 02/18/2023 and was short of breath and denied any chest pain.  He was volume overloaded and recommended transfer to Frank Morales, ED for further evaluation.  He was found to have a fever on admission and diagnosed with pyelonephritis as well as acute CHF.  He was treated with ceftriaxone  and discharged with 7-day course of Cipro .  He was treated with IV Lasix  and diuresed well with home regimen switched to torsemide  20 mg daily and metolazone  2.5 mg on Wednesday. Discussed the use of AI scribe software for clinical note transcription with the patient, who gave verbal consent to proceed.  History of Present Illness    ***Notes:   Review of Systems  Please see the history of present illness.    All other systems reviewed and are otherwise negative except as noted above.  Physical  Exam    Wt Readings from Last 3 Encounters:  08/05/23 295 lb 3.2 oz (133.9 kg)  05/08/23 (!) 313 lb (142 kg)  03/06/23 (!) 317 lb 11.2 oz (144.1 kg)   ZO:XWRUE were no vitals filed for this visit.,There is no height or weight on file to calculate BMI. GEN: Well nourished, well developed in no acute distress Neck: No JVD; No carotid bruits Pulmonary: Clear to auscultation without rales, wheezing or rhonchi  Cardiovascular: Normal rate. Regular rhythm. Normal S1. Normal S2.   Murmurs: There is no murmur.  ABDOMEN: Soft, non-tender, non-distended EXTREMITIES:  No edema; No deformity   EKG/LABS/ Recent Cardiac Studies   ECG personally  reviewed by me today - ***  Risk Assessment/Calculations:   {Does this patient have ATRIAL FIBRILLATION?:[phone]}      Lab Results  Component Value Date   WBC 5.3 05/08/2023   HGB 9.6 (L) 05/08/2023   HCT 30.4 (L) 05/08/2023   MCV 78 (L) 05/08/2023   PLT 235 05/08/2023   Lab Results  Component Value Date   CREATININE 0.87 05/08/2023   BUN 15 05/08/2023   NA 133 (L) 05/08/2023   K 4.5 05/08/2023   CL 94 (L) 05/08/2023   CO2 31 (H) 05/08/2023   Lab Results  Component Value Date   CHOL 154 02/06/2023   HDL 58 02/06/2023   LDLCALC 80 02/06/2023   TRIG 88 02/06/2023   CHOLHDL 2.7 02/06/2023    Lab Results  Component Value Date   HGBA1C 6.3 (H) 10/02/2022   Assessment & Plan    Assessment and Plan Assessment & Plan     1.  HFpEF: -2D echo completed on 08/08/2022 showing normal LV function with moderate LVH -Today patient reports***   2.  Permanent AF: -The patient is***   3.  Aortic dilation: -2D echo completed 07/2022 showing mild aortic dilation of 43 mm   4.  History of COPD: -Continue current treatment plan per pulmonology   5.  Essential hypertension      Disposition: Follow-up with Frank Berger, MD or APP in *** months {Are you ordering a CV Procedure (e.g. stress test, cath, DCCV, TEE, etc)?   Press F2        :[id number]}   Signed, Frank Morales, Frank Cast, NP 09/04/2023, 1:33 PM [facility]

## 2023-09-05 ENCOUNTER — Encounter: Payer: Self-pay | Admitting: Nurse Practitioner

## 2023-09-05 ENCOUNTER — Ambulatory Visit: Attending: Nurse Practitioner | Admitting: Nurse Practitioner

## 2023-09-05 VITALS — BP 118/60 | HR 70 | Ht 71.0 in | Wt 303.0 lb

## 2023-09-05 DIAGNOSIS — I5032 Chronic diastolic (congestive) heart failure: Secondary | ICD-10-CM

## 2023-09-05 DIAGNOSIS — I77810 Thoracic aortic ectasia: Secondary | ICD-10-CM | POA: Insufficient documentation

## 2023-09-05 DIAGNOSIS — I10 Essential (primary) hypertension: Secondary | ICD-10-CM

## 2023-09-05 DIAGNOSIS — J449 Chronic obstructive pulmonary disease, unspecified: Secondary | ICD-10-CM | POA: Diagnosis not present

## 2023-09-05 DIAGNOSIS — I4821 Permanent atrial fibrillation: Secondary | ICD-10-CM | POA: Diagnosis not present

## 2023-09-05 MED ORDER — TORSEMIDE 20 MG PO TABS: 20.0000 mg | ORAL_TABLET | Freq: Every day | ORAL | 1 refills | Status: AC

## 2023-09-05 NOTE — Patient Instructions (Signed)
 Medication Instructions:  CAN Take an additional 20mg  of Torsemide  for shortness of breath or increased swelling  *If you need a refill on your cardiac medications before your next appointment, please call your pharmacy*  Lab Work: TODAY-BMET If you have labs (blood work) drawn today and your tests are completely normal, you will receive your results only by: MyChart Message (if you have MyChart) OR A paper copy in the mail If you have any lab test that is abnormal or we need to change your treatment, we will call you to review the results.  Testing/Procedures: Your physician has requested that you have an echocardiogram. Echocardiography is a painless test that uses sound waves to create images of your heart. It provides your doctor with information about the size and shape of your heart and how well your heart's chambers and valves are working. This procedure takes approximately one hour. There are no restrictions for this procedure. Please do NOT wear cologne, perfume, aftershave, or lotions (deodorant is allowed). Please arrive 15 minutes prior to your appointment time.  Please note: We ask at that you not bring children with you during ultrasound (echo/ vascular) testing. Due to room size and safety concerns, children are not allowed in the ultrasound rooms during exams. Our front office staff cannot provide observation of children in our lobby area while testing is being conducted. An adult accompanying a patient to their appointment will only be allowed in the ultrasound room at the discretion of the ultrasound technician under special circumstances. We apologize for any inconvenience.   Follow-Up: At [facility], you and your health needs are our priority.  As part of our continuing mission to provide you with exceptional heart care, our providers are all part of one team.  This team includes your primary Cardiologist (physician) and Advanced Practice Providers or APPs  (Physician Assistants and Nurse Practitioners) who all work together to provide you with the care you need, when you need it.  Your next appointment:   6 month(s)  Provider:   Ola Berger, MD    We recommend signing up for the patient portal called MyChart.  Sign up information is provided on this After Visit Summary.  MyChart is used to connect with patients for Virtual Visits (Telemedicine).  Patients are able to view lab/test results, encounter notes, upcoming appointments, etc.  Non-urgent messages can be sent to your provider as well.   To learn more about what you can do with MyChart, go to [email or web address].   Other Instructions

## 2023-09-06 ENCOUNTER — Ambulatory Visit: Payer: Self-pay | Admitting: Nurse Practitioner

## 2023-09-06 LAB — BASIC METABOLIC PANEL WITH GFR
BUN/Creatinine Ratio: 25 — ABNORMAL HIGH (ref 10–24)
BUN: 18 mg/dL (ref 8–27)
CO2: 25 mmol/L (ref 20–29)
Calcium: 8.9 mg/dL (ref 8.6–10.2)
Chloride: 99 mmol/L (ref 96–106)
Creatinine, Ser: 0.72 mg/dL — ABNORMAL LOW (ref 0.76–1.27)
Glucose: 76 mg/dL (ref 70–99)
Potassium: 4.9 mmol/L (ref 3.5–5.2)
Sodium: 140 mmol/L (ref 134–144)
eGFR: 104 mL/min/{1.73_m2} (ref >59)

## 2023-09-07 DIAGNOSIS — I5032 Chronic diastolic (congestive) heart failure: Secondary | ICD-10-CM | POA: Diagnosis not present

## 2023-09-07 DIAGNOSIS — I509 Heart failure, unspecified: Secondary | ICD-10-CM | POA: Diagnosis not present

## 2023-09-15 ENCOUNTER — Other Ambulatory Visit: Payer: Self-pay | Admitting: Adult Health

## 2023-10-07 DIAGNOSIS — I5032 Chronic diastolic (congestive) heart failure: Secondary | ICD-10-CM | POA: Diagnosis not present

## 2023-10-07 DIAGNOSIS — I509 Heart failure, unspecified: Secondary | ICD-10-CM | POA: Diagnosis not present

## 2023-10-13 ENCOUNTER — Other Ambulatory Visit: Payer: Self-pay | Admitting: Student

## 2023-10-13 DIAGNOSIS — I5032 Chronic diastolic (congestive) heart failure: Secondary | ICD-10-CM

## 2023-10-13 NOTE — Telephone Encounter (Signed)
 Medication sent to pharmacy

## 2023-10-13 NOTE — Telephone Encounter (Unsigned)
 Copied from CRM [id number]. Topic: Clinical - Medication Refill >> Oct 13, 2023  9:15 AM Laurier C wrote: Medication: dapagliflozin  propanediol (FARXIGA ) 10 MG TABS tablet  Has the patient contacted their pharmacy? Yes Was advised to contact his provider's office because he didn't have any remaining refills.  This is the patient's preferred pharmacy:  [facility] [address], [place] - [address] [place] [address] Phone: [phone] Fax: [phone]  Is this the correct pharmacy for this prescription? Yes If no, delete pharmacy and type the correct one.   Has the prescription been filled recently? No  Is the patient out of the medication? Yes  Has the patient been seen for an appointment in the last year OR does the patient have an upcoming appointment? Yes  Can we respond through MyChart? No  Agent: Please be advised that Rx refills may take up to 3 business days. We ask that you follow-up with your pharmacy.

## 2023-10-21 ENCOUNTER — Ambulatory Visit (HOSPITAL_COMMUNITY)
Admission: RE | Admit: 2023-10-21 | Discharge: 2023-10-21 | Disposition: A | Discharge status: Home / Self Care | Source: Ambulatory Visit | Attending: Cardiology | Admitting: Cardiology

## 2023-10-21 DIAGNOSIS — J449 Chronic obstructive pulmonary disease, unspecified: Secondary | ICD-10-CM | POA: Insufficient documentation

## 2023-10-21 DIAGNOSIS — I4821 Permanent atrial fibrillation: Secondary | ICD-10-CM | POA: Diagnosis not present

## 2023-10-21 DIAGNOSIS — I77810 Thoracic aortic ectasia: Secondary | ICD-10-CM | POA: Diagnosis present

## 2023-10-21 DIAGNOSIS — I5032 Chronic diastolic (congestive) heart failure: Secondary | ICD-10-CM | POA: Insufficient documentation

## 2023-10-21 DIAGNOSIS — I10 Essential (primary) hypertension: Secondary | ICD-10-CM | POA: Insufficient documentation

## 2023-10-21 LAB — ECHOCARDIOGRAM COMPLETE
AR max vel: 3.04 cm2
AV Area VTI: 3.45 cm2
AV Area mean vel: 2.89 cm2
AV Mean grad: 5.1 mmHg
AV Peak grad: 10 mmHg
Ao pk vel: 1.58 m/s
S' Lateral: 4 cm

## 2023-10-28 DIAGNOSIS — H11153 Pinguecula, bilateral: Secondary | ICD-10-CM | POA: Diagnosis not present

## 2023-10-28 DIAGNOSIS — H40013 Open angle with borderline findings, low risk, bilateral: Secondary | ICD-10-CM | POA: Diagnosis not present

## 2023-10-28 DIAGNOSIS — H2513 Age-related nuclear cataract, bilateral: Secondary | ICD-10-CM | POA: Diagnosis not present

## 2023-10-28 DIAGNOSIS — H5213 Myopia, bilateral: Secondary | ICD-10-CM | POA: Diagnosis not present

## 2023-10-28 DIAGNOSIS — H538 Other visual disturbances: Secondary | ICD-10-CM | POA: Diagnosis not present

## 2023-11-07 DIAGNOSIS — I5032 Chronic diastolic (congestive) heart failure: Secondary | ICD-10-CM | POA: Diagnosis not present

## 2023-11-07 DIAGNOSIS — I509 Heart failure, unspecified: Secondary | ICD-10-CM | POA: Diagnosis not present

## 2023-12-08 DIAGNOSIS — I5032 Chronic diastolic (congestive) heart failure: Secondary | ICD-10-CM | POA: Diagnosis not present

## 2023-12-08 DIAGNOSIS — I509 Heart failure, unspecified: Secondary | ICD-10-CM | POA: Diagnosis not present

## 2023-12-12 ENCOUNTER — Other Ambulatory Visit: Payer: Self-pay

## 2023-12-12 ENCOUNTER — Encounter: Payer: Self-pay | Admitting: Allergy

## 2023-12-12 ENCOUNTER — Ambulatory Visit: Admitting: Allergy

## 2023-12-12 VITALS — BP 104/72 | HR 67 | Temp 97.2°F | Resp 16 | Ht 71.0 in | Wt 300.1 lb

## 2023-12-12 DIAGNOSIS — T781XXD Other adverse food reactions, not elsewhere classified, subsequent encounter: Secondary | ICD-10-CM | POA: Diagnosis not present

## 2023-12-12 DIAGNOSIS — J302 Other seasonal allergic rhinitis: Secondary | ICD-10-CM

## 2023-12-12 DIAGNOSIS — J3089 Other allergic rhinitis: Secondary | ICD-10-CM | POA: Diagnosis not present

## 2023-12-12 DIAGNOSIS — H1013 Acute atopic conjunctivitis, bilateral: Secondary | ICD-10-CM

## 2023-12-12 MED ORDER — LEVOCETIRIZINE DIHYDROCHLORIDE 5 MG PO TABS: 5.0000 mg | ORAL_TABLET | Freq: Every evening | ORAL | 5 refills | Status: AC

## 2023-12-12 MED ORDER — CROMOLYN SODIUM 4 % OP SOLN: 2.0000 [drp] | Freq: Four times a day (QID) | OPHTHALMIC | 5 refills | Status: AC | PRN

## 2023-12-12 MED ORDER — AZELASTINE-FLUTICASONE 137-50 MCG/ACT NA SUSP: 1.0000 | Freq: Two times a day (BID) | NASAL | 5 refills | Status: AC | PRN

## 2023-12-12 NOTE — Patient Instructions (Addendum)
 Chronic rhinitis with conjunctivitis - Continue avoidance measures for grass pollen, tree pollen, weed pollen, dust mites and cockroach.  - continue Xyzal  5mg  daily.   This is your antihistamine tablet.  Can take additional dose per day if needed for extra allergy symptom contorl.  Use Dymista  1 spray each nostril twice a day as needed for runny or stuffy nose control.  With using nasal sprays point tip of bottle toward eye on same side nostril and lean head slightly forward for best technique.   - use Cromolyn  1 drop each eye up to 4 times a day as needed for itchy/watery eyes. - perform nasal steam treatments (warm towel) at night and then follow-up with nasal spray.   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  Food allergy could lead to life threatening symptoms  Sensitivity: occurs when you have IgE to a food (positive food testing either by skin testing or blood testing) but is a food you eat without any issues.  This is not an allergy and we recommend keeping the food in the diet  Intolerance: this is when you have negative testing by either skin testing or blood testing thus not allergic but the food causes symptoms (like belly pain, bloating, diarrhea etc) with ingestion.  These foods should be avoided to prevent symptoms.     Follow-up in 6-12 months or sooner if needed

## 2023-12-12 NOTE — Progress Notes (Signed)
 Follow-up Note  RE: Frank Morales MRN: [id number] DOB: [birth year] Date of Office Visit: 12/12/2023   History of present illness: Frank Morales is a 63 y.o. male presenting today for follow-up of allergic rhinitis with conjunctivitis and adverse food reaction.  He was last evaluated on 06/11/2023 by myself. Discussed the use of AI scribe software for clinical note transcription with the patient, who gave verbal consent to proceed.  He reports allergy symptoms this year, and notes that his symptoms depend on the temperature, with more allergy symptoms including nasal congestion, drainage, coughing during hot weather. He uses a fan indoors to aid breathing and limits outdoor activity, sitting in the driveway and avoiding walking unless necessary. He attempts to clean outside but mostly relies on his cousin for this task.  He vacuums his home about twice a week and washes his bedding on the hottest setting to manage dust mites, which are another allergen for him.   He is currently using Xyzal  as an antihistamine, which he finds effective. He also uses Dymista  nasal spray once a day, which helps with nasal congestion and mucus production. He has been out of his eye drops but plans to refill them. He occasionally uses a saline spray to keep his nose moisturized, especially since he uses oxygen , which can dry out his nasal passages.  He is currently avoiding dairy in his diet.  No major health changes, surgeries, or hospitalizations since his last visit in March.     Review of systems: 10pt ROS negative unless noted in HPI  Past medical/social/surgical/family history have been reviewed and are unchanged unless specifically indicated below.  No changes  Medication List: Current Outpatient Medications  Medication Sig Dispense Refill   albuterol  (VENTOLIN  HFA) 108 (90 Base) MCG/ACT inhaler Inhale 2 puffs into the lungs every 6 (six) hours as needed for wheezing or shortness of breath. 8 g  2   azelastine  (ASTELIN ) 0.1 % nasal spray 2 sprays each nostril twice a day as needed 90 mL 1   Azelastine -Fluticasone  137-50 MCG/ACT SUSP Place 1 spray into the nose 2 (two) times daily as needed (Runny or stuffy nose). 23 g 11   cromolyn  (OPTICROM ) 4 % ophthalmic solution Place 1 drop into both eyes 4 (four) times daily as needed (Itchy watery eyes). 10 mL 11   dapagliflozin  propanediol (FARXIGA ) 10 MG TABS tablet TAKE 1 TABLET(10 MG) BY MOUTH DAILY BEFORE BREAKFAST 90 tablet 3   diltiazem  (CARDIZEM  CD) 240 MG 24 hr capsule TAKE 2 CAPSULES(480 MG) BY MOUTH DAILY 180 capsule 3   fluticasone  (FLONASE ) 50 MCG/ACT nasal spray 2 sprays each nostril twice a day as needed 36 g 1   fluticasone  (FLOVENT  HFA) 110 MCG/ACT inhaler Inhale 1 puff into the lungs daily. 12 g 12   levocetirizine (XYZAL ) 5 MG tablet Take 1 tablet (5 mg total) by mouth every evening. 30 tablet 11   metolazone  (ZAROXOLYN ) 2.5 MG tablet Take 1 tablet (2.5 mg total) by mouth once a week. Take every Wednesday. 30 tablet 1   potassium chloride  SA (KLOR-CON  M) 20 MEQ tablet Take 2 tablets (40 mEq total) by mouth daily. 120 tablet 2   rivaroxaban  (XARELTO ) 20 MG TABS tablet Take 1 tablet (20 mg total) by mouth daily with supper. 90 tablet 2   torsemide  (DEMADEX ) 20 MG tablet Take 1 tablet (20 mg total) by mouth daily. CAN TAKE AN ADDITIONAL TABLET FOR SHORTNESS OF BREATH OR INCREASED SWELLING 100 tablet 1   umeclidinium-vilanterol (  ANORO ELLIPTA ) 62.5-25 MCG/ACT AEPB Inhale 1 puff into the lungs daily. 60 each 2   No current facility-administered medications for this visit.     Known medication allergies: Allergies  Allergen Reactions   Quinolones     DUE TO THE PATIENT HAVING AORTIC ANEURYSM    Lactose Intolerance (Gi) Nausea And Vomiting     Physical examination: Blood pressure 104/72, pulse 67, temperature (!) 97.2 F (36.2 C), temperature source Temporal, resp. rate 16, height 5' 11 (1.803 m), weight (!) 300 lb 1.6 oz  (136.1 kg), SpO2 95%.  General: Alert, interactive, in no acute distress, nasal cannula in place. HEENT: PERRLA, TMs pearly gray, turbinates minimally edematous without discharge, post-pharynx non erythematous. Neck: Supple without lymphadenopathy. Lungs: Clear to auscultation without wheezing, rhonchi or rales. {no increased work of breathing. CV: Normal S1, S2 without murmurs. Abdomen: Nondistended, nontender. Skin: Warm and dry, without lesions or rashes. Extremities:  No clubbing, cyanosis or edema. Neuro:   Grossly intact.  Diagnostics/Labs: None today  Assessment and plan:   Chronic rhinitis with conjunctivitis - Continue avoidance measures for grass pollen, tree pollen, weed pollen, dust mites and cockroach.  - continue Xyzal  5mg  daily.   This is your antihistamine tablet.  Can take additional dose per day if needed for extra allergy symptom contorl.  Use Dymista  1 spray each nostril twice a day as needed for runny or stuffy nose control.  With using nasal sprays point tip of bottle toward eye on same side nostril and lean head slightly forward for best technique.   - use Cromolyn  1 drop each eye up to 4 times a day as needed for itchy/watery eyes. - perform nasal steam treatments (warm towel) at night and then follow-up with nasal spray.   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  Food allergy could lead to life threatening symptoms  Sensitivity: occurs when you have IgE to a food (positive food testing either by skin testing or blood testing) but is a food you eat without any issues.  This is not an allergy and we recommend keeping the food in the diet  Intolerance: this is when you have negative  testing by either skin testing or blood testing thus not allergic but the food causes symptoms (like belly pain, bloating, diarrhea etc) with ingestion.  These foods should be avoided to prevent symptoms.     Follow-up in 6-12 months or sooner if needed  I appreciate the opportunity to take part in Lliam's care. Please do not hesitate to contact me with questions.  Sincerely,   Danita Brain, MD Allergy/Immunology [facility]

## 2023-12-16 ENCOUNTER — Ambulatory Visit (INDEPENDENT_AMBULATORY_CARE_PROVIDER_SITE_OTHER)

## 2023-12-16 VITALS — BP 111/74 | HR 54 | Temp 98.4°F | Ht 71.0 in | Wt 302.0 lb

## 2023-12-16 DIAGNOSIS — R7309 Other abnormal glucose: Secondary | ICD-10-CM

## 2023-12-16 DIAGNOSIS — R7303 Prediabetes: Secondary | ICD-10-CM | POA: Diagnosis not present

## 2023-12-16 DIAGNOSIS — Z7901 Long term (current) use of anticoagulants: Secondary | ICD-10-CM

## 2023-12-16 DIAGNOSIS — K5904 Chronic idiopathic constipation: Secondary | ICD-10-CM

## 2023-12-16 DIAGNOSIS — Z8249 Family history of ischemic heart disease and other diseases of the circulatory system: Secondary | ICD-10-CM | POA: Diagnosis not present

## 2023-12-16 DIAGNOSIS — F17200 Nicotine dependence, unspecified, uncomplicated: Secondary | ICD-10-CM | POA: Diagnosis not present

## 2023-12-16 DIAGNOSIS — I5032 Chronic diastolic (congestive) heart failure: Secondary | ICD-10-CM | POA: Diagnosis not present

## 2023-12-16 DIAGNOSIS — Z833 Family history of diabetes mellitus: Secondary | ICD-10-CM | POA: Diagnosis not present

## 2023-12-16 DIAGNOSIS — F17210 Nicotine dependence, cigarettes, uncomplicated: Secondary | ICD-10-CM

## 2023-12-16 DIAGNOSIS — R3129 Other microscopic hematuria: Secondary | ICD-10-CM | POA: Diagnosis present

## 2023-12-16 DIAGNOSIS — I77810 Thoracic aortic ectasia: Secondary | ICD-10-CM

## 2023-12-16 DIAGNOSIS — K5900 Constipation, unspecified: Secondary | ICD-10-CM | POA: Diagnosis not present

## 2023-12-16 DIAGNOSIS — I4891 Unspecified atrial fibrillation: Secondary | ICD-10-CM | POA: Diagnosis not present

## 2023-12-16 DIAGNOSIS — R319 Hematuria, unspecified: Secondary | ICD-10-CM | POA: Diagnosis not present

## 2023-12-16 MED ORDER — POLYETHYLENE GLYCOL 3350 17 G PO PACK: 17.0000 g | PACK | Freq: Every day | ORAL | 0 refills | Status: AC

## 2023-12-16 MED ORDER — NICOTINE POLACRILEX 2 MG MT GUM: 2.0000 mg | CHEWING_GUM | OROMUCOSAL | 0 refills | Status: AC | PRN

## 2023-12-16 MED ORDER — SENNA 8.6 MG PO TABS: 1.0000 | ORAL_TABLET | Freq: Every day | ORAL | 0 refills | Status: AC

## 2023-12-16 NOTE — Patient Instructions (Addendum)
 Today we discussed the following medical conditions and plan:   Because you noticed some blood in your urine we are going to check your urine today and make sure there is not any blood in it.  We will also look at your blood counts  For your constipation, I recommend you start taking MiraLAX  once a day.  It comes in a powder form.  If after 4 to 5 days you are still having constipation then you can add the senna tablet.  Would also recommend you reach out to this number [phone], this is the stomach doctors number.  You were due for your colonoscopy in 2021 and see if they are able to schedule you for another one  I am excited that you are ready to quit smoking.  We can try the nicotine  gum and if that does not work you can let us  know and we can try some other options.  I think quitting smoking would be a great thing for your overall health.  We are also going to get a scan of your lungs that we do yearly just to make sure that they look good.   We look forward to seeing you next time. Please call our clinic at [phone] if you have any questions or concerns. The best time to call is Monday-Friday from 9am-4pm, but there is someone available 24/7. If you need medication refills, please notify your pharmacy one week in advance and they will send us  a request.   Thank you for trusting me with your care. Wishing you the best! I would like to see you back in a month   Yazhini Mcaulay D'Mello, DO  [facility]

## 2023-12-16 NOTE — Assessment & Plan Note (Signed)
 Last A1c was 6.3.  Will recheck today

## 2023-12-16 NOTE — Assessment & Plan Note (Addendum)
 Patient states that he had 1 episode last week of light pink blood in his urine.  States symptoms happen once before.  Unsure of what triggered it but patient states that could be due to the vinaigrette that he was using.  He is on a blood thinner chronically for atrial fibrillation.  No CVA tenderness on exam today, no dysuria endorsed.  Lower differential for infection.  With smoking history, he is at risk for bladder or renal cancer.  Will check a UA today and CBC with differential.  If persistent blood is there and signs of infection can treat versus further imaging needed for blood in urine

## 2023-12-16 NOTE — Assessment & Plan Note (Signed)
 Patient has been smoking for the last 50 years he says about 1-1/2 to 2 packs a week currently.  He has tried nicotine  patches in the past but would still get cravings.  Patient says that he has been trying to chew gum lately to keep him from smoking, he would like to try nicotine  gum before attempting medications like Chantix.  Also with his smoking history would like to get low-dose CT chest.  Patient had 1 last year and did show resolution of a previous nodule.

## 2023-12-16 NOTE — Progress Notes (Signed)
 Established Patient Office Visit  Subjective   Patient ID: Frank Morales, male    DOB: [birth year]  Age: 63 y.o. MRN: [id number]  Chief Complaint  Patient presents with   Nicotine  Dependence    Smokes two packs a week    Constipation    Sometimes I go whole week without a bowel movement    HPI Patient is a 63 year old male with past medical history of tobacco use disorder, COPD, OSA that presents today for 66-month follow-up.  See problem-based assessment for details   ROS See problem-based assessment   Objective:     BP 111/74 (BP Location: Right Arm, Patient Position: Sitting, Cuff Size: Normal)   Pulse (!) 54   Temp 98.4 F (36.9 C) (Oral)   Ht 5' 11 (1.803 m)   Wt (!) 302 lb (137 kg)   SpO2 98% Comment: on 3L o2  BMI 42.12 kg/m  BP Readings from Last 3 Encounters:  12/16/23 111/74  12/12/23 104/72  09/05/23 118/60   Wt Readings from Last 3 Encounters:  12/16/23 (!) 302 lb (137 kg)  12/12/23 (!) 300 lb 1.6 oz (136.1 kg)  09/05/23 (!) 303 lb (137.4 kg)      Physical Exam Constitution: Chronically ill-appearing male Lungs: Clear to auscultation, no respiratory distress, effort normal, on 3 L of oxygen , no wheezing auscultated Heart: Regular rate, no murmurs heard Abdomen: No tenderness to palpation all 4 quadrants bowel sounds present but slow, no CVA tenderness bilaterally Lower extremities: Mild pitting edema bilaterally, patient is using compression stockings No results found for any visits on 12/16/23.  Last hemoglobin A1c Lab Results  Component Value Date   HGBA1C 6.3 (H) 10/02/2022      The 10-year ASCVD risk score (Arnett DK, et al., 2019) is: 16.6%    Assessment & Plan:   Problem List Items Addressed This Visit     Tobacco use disorder - Primary (Chronic)   Patient has been smoking for the last 50 years he says about 1-1/2 to 2 packs a week currently.  He has tried nicotine  patches in the past but would still get cravings.  Patient  says that he has been trying to chew gum lately to keep him from smoking, he would like to try nicotine  gum before attempting medications like Chantix.  Also with his smoking history would like to get low-dose CT chest.  Patient had 1 last year and did show resolution of a previous nodule.      Relevant Orders   CT CHEST LUNG CA SCREEN LOW DOSE W/O CM   Hemoglobin A1c   Chronic diastolic CHF (congestive heart failure) (HCC) (Chronic)   Relevant Orders   Basic metabolic panel with GFR   Prediabetes   Last A1c was 6.3.  Will recheck today      Hematuria of unknown etiology   Patient states that he had 1 episode last week of light pink blood in his urine.  States symptoms happen once before.  Unsure of what triggered it but patient states that could be due to the vinaigrette that he was using.  He is on a blood thinner chronically for atrial fibrillation.  No CVA tenderness on exam today, no dysuria endorsed.  Lower differential for infection.  With smoking history, he is at risk for bladder or renal cancer.  Will check a UA today and CBC with differential.  If persistent blood is there and signs of infection can treat versus further imaging needed for blood in  urine      Relevant Orders   CBC with Diff   Urinalysis, Reflex Microscopic   Ascending aorta dilatation   EF is 60 to 65% no regional wall motion abnormalities, mild LVH, left atrium severely dilated.  Aortic dilatation stable at 42 mm.  Patient follows with cardiology for this.  Stable at this time      Constipation    Patient states that he has not had a bowel movement about 3 to 4 days and usually sometimes can go weeks without having a full bowel movement.  He states that this has been a chronic issue.  States that he used to take stool softeners before but was taken off of this medication.  He denies any hematochezia or melena.  Last colonoscopy was in 2018.  Patient was due for 1 in 2023.  Did give him the number to reach out to  GI to call about setting up colonoscopy.  At this time have prescribed MiraLAX  with instructions to take once a day for 4 to 5 days before starting senna if patient is still having trouble with bowel movement         Return in about 4 weeks (around 01/13/2024).    Kirtan Sada D'Mello, DO

## 2023-12-16 NOTE — Assessment & Plan Note (Signed)
 Patient states that he has not had a bowel movement about 3 to 4 days and usually sometimes can go weeks without having a full bowel movement.  He states that this has been a chronic issue.  States that he used to take stool softeners before but was taken off of this medication.  He denies any hematochezia or melena.  Last colonoscopy was in 2018.  Patient was due for 1 in 2023.  Did give him the number to reach out to GI to call about setting up colonoscopy.  At this time have prescribed MiraLAX  with instructions to take once a day for 4 to 5 days before starting senna if patient is still having trouble with bowel movement

## 2023-12-16 NOTE — Assessment & Plan Note (Signed)
 EF is 60 to 65% no regional wall motion abnormalities, mild LVH, left atrium severely dilated.  Aortic dilatation stable at 42 mm.  Patient follows with cardiology for this.  Stable at this time

## 2023-12-17 ENCOUNTER — Telehealth: Payer: Self-pay | Admitting: *Deleted

## 2023-12-17 ENCOUNTER — Other Ambulatory Visit: Payer: Self-pay

## 2023-12-17 ENCOUNTER — Ambulatory Visit: Payer: Self-pay

## 2023-12-17 DIAGNOSIS — A419 Sepsis, unspecified organism: Secondary | ICD-10-CM

## 2023-12-17 DIAGNOSIS — I5032 Chronic diastolic (congestive) heart failure: Secondary | ICD-10-CM

## 2023-12-17 DIAGNOSIS — J449 Chronic obstructive pulmonary disease, unspecified: Secondary | ICD-10-CM

## 2023-12-17 DIAGNOSIS — R319 Hematuria, unspecified: Secondary | ICD-10-CM

## 2023-12-17 LAB — HEMOGLOBIN A1C
Est. average glucose Bld gHb Est-mCnc: 114 mg/dL
Hgb A1c MFr Bld: 5.6 % (ref 4.8–5.6)

## 2023-12-17 LAB — CBC WITH DIFFERENTIAL/PLATELET
Basophils Absolute: 0.1 x10E3/uL (ref 0.0–0.2)
Basos: 1 %
EOS (ABSOLUTE): 0.2 x10E3/uL (ref 0.0–0.4)
Eos: 3 %
Hematocrit: 36.2 % — ABNORMAL LOW (ref 37.5–51.0)
Hemoglobin: 11.8 g/dL — ABNORMAL LOW (ref 13.0–17.7)
Immature Grans (Abs): 0 x10E3/uL (ref 0.0–0.1)
Immature Granulocytes: 0 %
Lymphocytes Absolute: 1.6 x10E3/uL (ref 0.7–3.1)
Lymphs: 30 %
MCH: 27.4 pg (ref 26.6–33.0)
MCHC: 32.6 g/dL (ref 31.5–35.7)
MCV: 84 fL (ref 79–97)
Monocytes Absolute: 0.9 x10E3/uL (ref 0.1–0.9)
Monocytes: 18 %
Neutrophils Absolute: 2.5 x10E3/uL (ref 1.4–7.0)
Neutrophils: 48 %
Platelets: 246 x10E3/uL (ref 150–450)
RBC: 4.3 x10E6/uL (ref 4.14–5.80)
RDW: 20.2 % — ABNORMAL HIGH (ref 11.6–15.4)
WBC: 5.2 x10E3/uL (ref 3.4–10.8)

## 2023-12-17 LAB — URINALYSIS, ROUTINE W REFLEX MICROSCOPIC
Bilirubin, UA: NEGATIVE
Ketones, UA: NEGATIVE
Nitrite, UA: NEGATIVE
Specific Gravity, UA: 1.012 (ref 1.005–1.030)
Urobilinogen, Ur: 0.2 mg/dL (ref 0.2–1.0)
pH, UA: 7.5 (ref 5.0–7.5)

## 2023-12-17 LAB — BASIC METABOLIC PANEL WITH GFR
BUN/Creatinine Ratio: 21 (ref 10–24)
BUN: 18 mg/dL (ref 8–27)
CO2: 28 mmol/L (ref 20–29)
Calcium: 8.7 mg/dL (ref 8.6–10.2)
Chloride: 88 mmol/L — ABNORMAL LOW (ref 96–106)
Creatinine, Ser: 0.84 mg/dL (ref 0.76–1.27)
Glucose: 88 mg/dL (ref 70–99)
Potassium: 3.7 mmol/L (ref 3.5–5.2)
Sodium: 136 mmol/L (ref 134–144)
eGFR: 99 mL/min/1.73 (ref >59)

## 2023-12-17 LAB — MICROSCOPIC EXAMINATION
Bacteria, UA: NONE SEEN
Casts: NONE SEEN /LPF
Epithelial Cells (non renal): NONE SEEN /HPF (ref 0–10)
RBC, Urine: >30 /HPF — AB (ref 0–2)
WBC, UA: >30 /HPF — AB (ref 0–5)

## 2023-12-17 NOTE — Progress Notes (Signed)
 Internal Medicine Attending:   I was physically present during the key portions of the resident provided service and participated in medical decision making of the patient's management care in the assessment and plan.    Patient here for routine follow-up with 2 concerns: constipation and an episode of gross hematuria.  For the hematuria, he reports an episode of gross hematuria, but since then has not noticed blood in his urine. U/A today shows 3+ RBCs, confirmed on microscopic exam. I believe he is established with [facility], but we will place a new referral to ensure expedited follow-up. He needs to see Urology.   For his constipation, I agree with trial of daily miralax , with prn daily senna as needed if he's still constipation.   He's also working on full smoking cessation. He'll be due for his annual CT chest for lung cancer screening in October, so we've ordered this today.

## 2023-12-17 NOTE — Progress Notes (Signed)
 Complex Care Management Note  Care Guide Note 12/17/2023 Name: PARK BECK MRN: [id number] DOB: 06-Sep-1960  JAEVEN WANZER is a 63 y.o. year old male who sees Nooruddin, Saad, MD for primary care. I reached out to [employer] by phone today to offer complex care management services.  Mr. Horn was given information about Complex Care Management services today including:   The Complex Care Management services include support from the care team which includes your Nurse Care Manager, Clinical Social Worker, or Pharmacist.  The Complex Care Management team is here to help remove barriers to the health concerns and goals most important to you. Complex Care Management services are voluntary, and the patient may decline or stop services at any time by request to their care team member.   Complex Care Management Consent Status: Patient agreed to services and verbal consent obtained.   Follow up plan:  Telephone appointment with complex care management team member scheduled for:  12/24/23  Encounter Outcome:  Patient Scheduled  Harlene Satterfield  [facility]  [facility], [facility] Guide  Direct Dial: [phone]  Fax [phone]

## 2023-12-24 ENCOUNTER — Telehealth: Payer: Self-pay

## 2023-12-24 NOTE — Patient Outreach (Signed)
 Care Coordination   12/24/2023 Name: Frank Morales MRN: [id number] DOB: Sep 30, 1960   Care Coordination Outreach Attempts:  An unsuccessful outreach was attempted for an appointment today.  Follow Up Plan:  Additional outreach attempts will be made to offer the patient complex care management information and services.   Encounter Outcome:  No Answer. Message left requesting return call.   Rosaline Finlay, RN MSN [place]  VBCI Population Health RN Care Manager Direct Dial: [phone]  Fax: [phone]

## 2023-12-30 ENCOUNTER — Telehealth: Payer: Self-pay | Admitting: *Deleted

## 2023-12-30 NOTE — Progress Notes (Signed)
 Complex Care Management Care Guide Note  12/30/2023 Name: Frank Morales MRN: [id number] DOB: 1960/11/28  [name] is a 63 y.o. year old male who is a primary care patient of Nooruddin, Saad, MD and is actively engaged with the care management team. I reached out to [name] by phone today to assist with re-scheduling  with the RN Case Manager.  Follow up plan: Unsuccessful telephone outreach attempt made. A HIPAA compliant phone message was left for the patient providing contact information and requesting a return call.  Harlene Satterfield  [place] [facility]  [facility], [facility] Guide  Direct Dial: [phone]  Fax [phone]

## 2024-01-02 ENCOUNTER — Other Ambulatory Visit: Payer: Self-pay

## 2024-01-02 VITALS — Wt 300.0 lb

## 2024-01-02 DIAGNOSIS — Z139 Encounter for screening, unspecified: Secondary | ICD-10-CM

## 2024-01-02 NOTE — Patient Outreach (Signed)
 Complex Care Management   Visit Note  01/02/2024  Name:  Frank Morales MRN: [id number] DOB: [birth year]  Situation: Referral received for Complex Care Management related to Heart Failure and COPD I obtained verbal consent from Patient.  Visit completed with Patient  on the phone  Background:   Past Medical History:  Diagnosis Date   Acute exacerbation of CHF (congestive heart failure) (HCC) 12/15/2019   Acute heart failure (HCC) 12/16/2019   Anemia    Angio-edema    Arthritis    Atrial fibrillation and flutter (HCC) 08/17/2015   A. S/p failed DCCV // b. Severe BAE on echo >> rate control strategy (has seen AF clinic) // c. Xarelto  for anticoag (CHADS2-VASc=2 / CHF, HTN)   Chronic diastolic CHF (congestive heart failure) (HCC) 08/30/2015   A. Echo 6/17: Apical HK, moderate focal basal and mild concentric LVH, EF 50-55, diffuse HK, trivial MR, severe BAE, mild TR, PASP 37   COPD (chronic obstructive pulmonary disease) (HCC)    Dependence on continuous supplemental oxygen     3L   Dyspnea    Dysrhythmia    Afib   Hematuria 01/10/2021   Hepatic fibrosis 02/29/2016   F3/4 on elastography.    Hepatitis C, chronic (HCC) 08/19/2015   Hepatitis C, chronic (HCC) 08/19/2015   Treated in 2017 with SVR   History of cardiac catheterization    a. LHC 6/17: LAD irregs, o/w no CAD   History of kidney stones    Hypertension    Hyposmia 12/22/2017   OSA (obstructive sleep apnea) 11/21/2015   No Cpap   Oxygen  dependent    Pleural effusion on right 03/14/2018   Pre-diabetes    Sleep apnea    Tobacco abuse     Assessment: Patient Reported Symptoms:  Cognitive Cognitive Status: Able to follow simple commands, Alert and oriented to person, place, and time, Normal speech and language skills Cognitive/Intellectual Conditions Management [RPT]: None reported or documented in medical history or problem list   Health Maintenance Behaviors: Annual physical exam, Exercise, Healthy diet,  Immunizations Health Facilitated by: Healthy diet  Neurological Neurological Review of Symptoms: No symptoms reported    HEENT HEENT Symptoms Reported: No symptoms reported HEENT Management Strategies: Routine screening, Medical device    Cardiovascular Cardiovascular Symptoms Reported: No symptoms reported Does patient have uncontrolled Hypertension?: No Cardiovascular Management Strategies: Medication therapy, Routine screening, Weight management, Diet modification Do You Have a Working Readable Scale?: Yes Weight: 300 lb (136.1 kg) (Patient reported)  Respiratory Respiratory Symptoms Reported: No symptoms reported Additional Respiratory Details: Patient reports he did receive his flu and pneumonia vaccine this year. He reports nicotine  gum was unavailable last time he went to the pharmacy. He is currently smoking 1/2 PPD. Respiratory Management Strategies: Medication therapy, Routine screening  Endocrine Endocrine Symptoms Reported: No symptoms reported Is patient diabetic?: No    Gastrointestinal Gastrointestinal Symptoms Reported: Constipation Additional Gastrointestinal Details: Patient reports a good appetite. Patient reports issues related to constipation. He was prescribed Miralax  by PCP at most recent office visit, but has not been able to get that from pharmacy yet. Last BM about 2 days ago per patient. He reports he typically goes every 2-3 days. Patient denies nausea, vomiting, or abdominal pain. Gastrointestinal Management Strategies: Medication therapy Gastrointestinal Comment: Noted that per most recent office visit, patient was advised to reach out to GI to schedule colonoscopy. Per chart review patient was previously seen at [facility] and previous colonoscopy was cancelled for fluid retention. Provided patient with  office phone number [phone]    Genitourinary Genitourinary Symptoms Reported: No symptoms reported Genitourinary Comment: Per chart review, a referral was  placed for urology at most recent PCP visit due to patient report of 1 episode of light pink blood in the urine. Patient denies blood in the urine since. Referral was placed to [facility]. Provided patient with office phone number [phone]  Integumentary Integumentary Symptoms Reported: No symptoms reported    Musculoskeletal Musculoskelatal Symptoms Reviewed: No symptoms reported Musculoskeletal Management Strategies: Adequate rest, Exercise, Medical device (Cane) Musculoskeletal Comment: Patient reports he tries to do regular exercise by walking outside. [place] which makes it difficult at times. Falls in the past year?: No Number of falls in past year: 1 or less Was there an injury with Fall?: No Fall Risk Category Calculator: 0 Patient Fall Risk Level: Low Fall Risk Patient at Risk for Falls Due to: No Fall Risks Fall risk Follow up: Falls evaluation completed, Education provided, Falls prevention discussed  Psychosocial Psychosocial Symptoms Reported: No symptoms reported     Quality of Family Relationships: helpful, involved, supportive Do you feel physically threatened by others?: No    01/02/2024    PHQ2-9 Depression Screening   Little interest or pleasure in doing things Not at all  Feeling down, depressed, or hopeless Not at all  PHQ-2 - Total Score 0  Trouble falling or staying asleep, or sleeping too much    Feeling tired or having little energy    Poor appetite or overeating     Feeling bad about yourself - or that you are a failure or have let yourself or your family down    Trouble concentrating on things, such as reading the newspaper or watching television    Moving or speaking so slowly that other people could have noticed.  Or the opposite - being so fidgety or restless that you have been moving around a lot more than usual    Thoughts that you would be better off dead, or hurting yourself in some way    PHQ2-9 Total Score     If you checked off any problems, how difficult have these problems made it for you to do your work, take care of things at home, or get along with other people    Depression Interventions/Treatment      There were no vitals filed for this visit.  Medications Reviewed Today     Reviewed by Arno Rosaline SQUIBB, RN (Registered Nurse) on 01/02/24 at 1316  Med List Status: <None>   Medication Order Taking? Sig Documenting Provider Last Dose Status Informant  albuterol  (VENTOLIN  HFA) 108 (90 Base) MCG/ACT inhaler [id number] Yes Inhale 2 puffs into the lungs every 6 (six) hours as needed for wheezing or shortness of breath. Seawell, Jaimie A, DO  Active Self, Pharmacy Records  Azelastine -Fluticasone  (DYMISTA ) 137-50 MCG/ACT SUSP [id number] Yes Place 1 spray into both nostrils 2 (two) times daily as needed (runny or stuffy nose). Jeneal Danita Macintosh, MD  Active   Azelastine -Fluticasone  137-50 MCG/ACT CONCHETTA [id number] Yes Place 1 spray into the nose 2 (two) times daily as needed (Runny or stuffy nose). Jeneal Danita Macintosh, MD  Active   cromolyn  (OPTICROM ) 4 % ophthalmic solution [id number] Yes Place 2 drops into both eyes 4 (four) times daily as needed (Itchy watery eyes). Jeneal Danita Macintosh, MD  Active   dapagliflozin  propanediol (FARXIGA ) 10 MG TABS tablet [id number] Yes TAKE 1 TABLET(10 MG) BY MOUTH DAILY  BEFORE BREAKFAST Nooruddin, Saad, MD  Active   diltiazem  (CARDIZEM  CD) 240 MG 24 hr capsule [id number] Yes TAKE 2 CAPSULES(480 MG) BY MOUTH DAILY Okey Vina GAILS, MD  Active   fluticasone  (FLOVENT  HFA) 110 MCG/ACT inhaler [id number] Yes Inhale 1 puff into the lungs daily. Jolaine Pac, DO  Active   levocetirizine (XYZAL ) 5 MG tablet [id number] Yes Take 1 tablet (5 mg total) by mouth every evening. Jeneal Danita Macintosh, MD  Active   metolazone  (ZAROXOLYN ) 2.5 MG tablet [id number] Yes Take 1 tablet (2.5 mg total) by mouth once a week. Take every Wednesday. Koomson, Julius, MD  Active    nicotine  polacrilex (NICORETTE ) 2 MG gum [id number]  Take 1 each (2 mg total) by mouth as needed for smoking cessation.  Patient not taking: Reported on 01/02/2024   D'Mello, Rosalyn, DO  Active   polyethylene glycol (MIRALAX ) 17 g packet [id number]  Take 17 g by mouth daily.  Patient not taking: Reported on 01/02/2024   D'Mello, Rosalyn, DO  Active   potassium chloride  SA (KLOR-CON  M) 20 MEQ tablet [id number] Yes Take 2 tablets (40 mEq total) by mouth daily. Nooruddin, Saad, MD  Active   rivaroxaban  (XARELTO ) 20 MG TABS tablet [id number] Yes Take 1 tablet (20 mg total) by mouth daily with supper. Jerilynn Lamarr HERO, NP  Active Self, Pharmacy Records  senna [facility]) 8.6 MG TABS tablet [id number] Yes Take 1 tablet (8.6 mg total) by mouth daily. D'Mello, Rosalyn, DO  Active   torsemide  (DEMADEX ) 20 MG tablet [id number] Yes Take 1 tablet (20 mg total) by mouth daily. CAN TAKE AN ADDITIONAL TABLET FOR SHORTNESS OF BREATH OR INCREASED SWELLING Wyn Jackee VEAR Mickey., NP  Active   umeclidinium-vilanterol (ANORO ELLIPTA ) 62.5-25 MCG/ACT AEPB [id number] Yes Inhale 1 puff into the lungs daily. Celestina Czar, MD  Active             Recommendation:   PCP Follow-up Specialty provider follow-up GI, urology Continue Current Plan of Care  Follow Up Plan:   Telephone follow up appointment date/time:  01/16/24 at 2 PM Referral to BSW. Scheduled with [place] Munoz[place]Lopez 01/15/24 at 1 PM  Rosaline Finlay, RN MSN [place]  [facility] Health RN Care Manager Direct Dial: [phone]  Fax: [phone]

## 2024-01-02 NOTE — Patient Instructions (Signed)
 Visit Information  Frank Morales was given information about Medicaid Managed Care team care coordination services as a part of their [facility] Community Plan Medicaid benefit.   If you would like to schedule transportation through your [facility], please call the following number at least 2 days in advance of your appointment: [phone]   Rides for urgent appointments can also be made after hours by calling Member Services.  Call the Behavioral Health Crisis Line at [phone], at any time, 24 hours a day, 7 days a week. If you are in danger or need immediate medical attention call 911.   The patient verbalized understanding of instructions, educational materials, and care plan provided today and DECLINED offer to receive copy of patient instructions, educational materials, and care plan.   The Patient                                              will call GI and urology offices* as advised to schedule appointments.  RN Care Manager will message representative to request rescheduling CT chest Telephone follow up appointment with Managed Medicaid care management team member scheduled for: 01/16/24 at 2 PM  Rosaline Finlay, RN MSN [employer]  VBCI Population Health RN Care Manager Direct Dial: [phone]  Fax: [phone]   Following is a copy of your plan of care:  There are no care plans that you recently modified to display for this patient.

## 2024-01-05 ENCOUNTER — Ambulatory Visit (HOSPITAL_BASED_OUTPATIENT_CLINIC_OR_DEPARTMENT_OTHER)

## 2024-01-06 NOTE — Progress Notes (Signed)
 Complex Care Management Care Guide Note  01/06/2024 Name: KEKOA FYOCK MRN: [id number] DOB: 01/19/1961  ADDIEL MCCARDLE is a 63 y.o. year old male who is a primary care patient of Nooruddin, Saad, MD and is actively engaged with the care management team. I reached out to [name] by phone today to assist with re-scheduling  with the RN Case Manager.  Follow up plan: Telephone appointment with complex care management team member scheduled for:  01/02/24  Harlene Satterfield  [facility] Health  [facility], [facility] Guide  Direct Dial: [phone]  Fax [phone]

## 2024-01-07 DIAGNOSIS — I5032 Chronic diastolic (congestive) heart failure: Secondary | ICD-10-CM | POA: Diagnosis not present

## 2024-01-07 DIAGNOSIS — I509 Heart failure, unspecified: Secondary | ICD-10-CM | POA: Diagnosis not present

## 2024-01-08 DIAGNOSIS — H524 Presbyopia: Secondary | ICD-10-CM | POA: Diagnosis not present

## 2024-01-13 ENCOUNTER — Ambulatory Visit: Admitting: Student

## 2024-01-13 ENCOUNTER — Ambulatory Visit (HOSPITAL_BASED_OUTPATIENT_CLINIC_OR_DEPARTMENT_OTHER)

## 2024-01-15 ENCOUNTER — Other Ambulatory Visit: Payer: Self-pay

## 2024-01-15 NOTE — Patient Instructions (Signed)
 Curtistine SAUNDERS Mahmood - I am sorry I was unable to reach you today for our scheduled appointment. I work with Nooruddin, Saad, MD and am calling to support your healthcare needs. I have rescheduled you for 01/23/2024 at 3pm. Please contact me at [phone] if you have any questions or need to reschedule your appointment. I look forward to speaking with you soon  Thank you,   Laymon Doll, BSW [place]/VBCI - [facility] Social Worker [phone]

## 2024-01-16 ENCOUNTER — Telehealth: Payer: Self-pay

## 2024-01-16 NOTE — Patient Outreach (Signed)
 Care Coordination   01/16/2024 Name: Frank Morales MRN: [id number] DOB: 1960-10-06   Care Coordination Outreach Attempts:  An unsuccessful outreach was attempted for an appointment today.  Follow Up Plan:  Additional outreach attempts will be made to complete CCM follow-up visit.   Encounter Outcome:  No Answer. HIPAA compliant voicemail left requesting return call.   Rosaline Finlay, RN MSN [place]  VBCI Population Health RN Care Manager Direct Dial: [phone]  Fax: [phone]

## 2024-01-19 ENCOUNTER — Ambulatory Visit: Admitting: Student

## 2024-01-19 NOTE — Patient Outreach (Signed)
 Care Coordination   01/19/2024 Name: Frank Morales MRN: [id number] DOB: 07/12/1960   Care Coordination Outreach Attempts: A second unsuccessful outreach was attempted today to complete CCM follow-up visit.   Follow Up Plan:  Additional outreach attempts will be made to complete follow-up visit.   Encounter Outcome:  No Answer. HIPAA compliant voicemail left requesting return call.   Rosaline Finlay, RN MSN [place]  VBCI Population Health RN Care Manager Direct Dial: [phone]  Fax: [phone]

## 2024-01-22 NOTE — Patient Outreach (Signed)
 Care Coordination   01/22/2024 Name: Frank Morales MRN: [id number] DOB: 09/16/1960   Care Coordination Outreach Attempts:  A third unsuccessful outreach was attempted today to complete CCM follow-up visit.  Follow Up Plan:  No further outreach attempts will be made by CMRN at this time. We have been unable to contact the patient to complete follow-up visit. Patient is scheduled with BSW 01/23/24.  Encounter Outcome:  No Answer. HIPAA compliant voicemail left requesting return call.   Rosaline Finlay, RN MSN [place]  VBCI Population Health RN Care Manager Direct Dial: [phone]  Fax: [phone]

## 2024-01-23 ENCOUNTER — Other Ambulatory Visit: Payer: Self-pay

## 2024-01-23 NOTE — Patient Instructions (Signed)
 Visit Information  [name] was given information about Medicaid Managed Care team care coordination services as a part of their [facility] Community Plan Medicaid benefit.   If you would like to schedule transportation through your [facility], please call the following number at least 2 days in advance of your appointment: [phone]   Rides for urgent appointments can also be made after hours by calling Member Services.  Call the Behavioral Health Crisis Line at [phone], at any time, 24 hours a day, 7 days a week. If you are in danger or need immediate medical attention call 911.  Please see education materials related to food resources, CHS provided provided via email.  Care plan and visit instructions communicated with the patient verbally today. The patient DECLINED to receive copy of care plan and patient instructions in any format.   Telephone follow up appointment with Managed Medicaid care management team member scheduled for: 02/06/2024 at 1:30pm  Frank Morales, BSW [place]/VBCI - [facility] Social Worker [phone]   Following is a copy of your plan of care:  There are no care plans that you recently modified to display for this patient.

## 2024-01-23 NOTE — Patient Outreach (Signed)
 Social Drivers of Health  Community Resource and Care Coordination Visit Note   01/23/2024  Name: Frank Morales MRN: [id number] DOB:01/17/1961  Situation: Referral received for [facility] needs assessment and assistance related to Transportation Financial Strain  Food Insecurity  mold in home. I obtained verbal consent from Patient.  Visit completed with Patient on the phone.   Background:   SDOH Interventions Today    Flowsheet Row Most Recent Value  SDOH Interventions   Food Insecurity Interventions Community Resources Provided  science applications international and out of the garden food schedule was shared with patient via mail.]  Housing Interventions Intervention Not Indicated  Transportation Interventions Payor Benefit, Patient Resources (Friends/Family)  [rely on friends for othre non-medical transportation or family members.]  Utilities Interventions Intervention Not Indicated  Financial Strain Interventions Intervention Not Indicated  [patient states at times it is hard.]     Assessment:   Goals Addressed             This Visit's Progress    BSW Goal   On track    Current SDOH Barriers:  Transportation Limited access to food Mold/home repair  Interventions: Patient interviewed and appropriate screenings performed Referred patient to community resources  Provided patient with information about out of the garden therapist, art, community housing solutions, and SCAT transportation through [employer] Advised patient to f/u with community housing solutions re possible assistance with mold issue in the home.  Pt declined SCAT application at this time.           Recommendation:   attend all scheduled provider appointments call for transportation assistance at least one week before appointments ask for help if you don't understand your health insurance benefits call and/or follow up with out of the garden food project for food assistance Call community housing solutions re mold  issue in home / possible repairs.   Follow Up Plan:   Telephone follow up appointment date/time:  02/06/2024 at 1:30pm  Laymon Doll, BSW [place]/VBCI - [facility] Social Worker [phone]

## 2024-01-26 ENCOUNTER — Telehealth: Payer: Self-pay

## 2024-01-27 ENCOUNTER — Other Ambulatory Visit: Payer: Self-pay | Admitting: Adult Health

## 2024-01-27 DIAGNOSIS — I4891 Unspecified atrial fibrillation: Secondary | ICD-10-CM

## 2024-01-27 NOTE — Telephone Encounter (Signed)
 Xarelto  20mg  refill request received. Pt is 63 years old, weight-136.1kg, Crea-0.84 on 12/16/23, last seen by Jackee Alberts on 09/05/23, Diagnosis-Afib CrCl-175.53 mL/min; Dose is appropriate based on dosing criteria. Will send in refill to requested pharmacy.

## 2024-01-28 NOTE — Patient Outreach (Signed)
 Care Coordination   01/26/2024 Name: BRYSE BLANCHETTE MRN: [id number] DOB: 14-Jan-1961   Care Coordination Outreach Attempts:  An unsuccessful outreach was attempted for an appointment today.  Follow Up Plan:  Additional outreach attempts will be made to complete CCM follow-up visit.   Encounter Outcome:  No Answer. HIPAA compliant voicemail left requesting return call.   Rosaline Finlay, RN MSN [place]  VBCI Population Health RN Care Manager Direct Dial: [phone]  Fax: [phone]

## 2024-01-28 NOTE — Patient Outreach (Signed)
 Care Coordination   01/28/2024 Name: Frank Morales MRN: [id number] DOB: [birth year]   Care Coordination Outreach Attempts:  A second unsuccessful outreach was attempted today to complete CCM follow-up visit.  Follow Up Plan:  Additional outreach attempts will be made to complete follow-up visit.   Encounter Outcome:  No Answer. HIPAA compliant voicemail left requesting return call.   Rosaline Finlay, RN MSN   VBCI Population Health RN Care Manager Direct Dial: [phone]  Fax: [phone]

## 2024-01-30 ENCOUNTER — Other Ambulatory Visit: Payer: Self-pay

## 2024-01-30 ENCOUNTER — Telehealth: Payer: Self-pay | Admitting: Gastroenterology

## 2024-01-30 NOTE — Patient Instructions (Signed)
 Visit Information  Mr. Paulding was given information about Medicaid Managed Care team care coordination services as a part of their [facility] Community Plan Medicaid benefit.   If you would like to schedule transportation through your [facility], please call the following number at least 2 days in advance of your appointment: [phone]   Rides for urgent appointments can also be made after hours by calling Member Services.  Call the Behavioral Health Crisis Line at [phone], at any time, 24 hours a day, 7 days a week. If you are in danger or need immediate medical attention call 911.  Care plan and visit instructions communicated with the patient verbally today. The patient DECLINED to receive copy of care plan and patient instructions in any format.   Telephone follow up appointment with Managed Medicaid care management team member scheduled for: 02/13/24 at 11 AM  Rosaline Finlay, RN MSN [place]  [facility] Health RN Care Manager Direct Dial: [phone]  Fax: [phone]   Following is a copy of your plan of care:  There are no care plans that you recently modified to display for this patient.

## 2024-01-30 NOTE — Patient Outreach (Signed)
 Complex Care Management   Visit Note  01/30/2024  Name:  Frank Morales MRN: [id number] DOB: 1960-06-13  Situation: Referral received for Complex Care Management related to Heart Failure and COPD I obtained verbal consent from Patient.  Visit completed with Patient  on the phone  Background:   Past Medical History:  Diagnosis Date   Acute exacerbation of CHF (congestive heart failure) (HCC) 12/15/2019   Acute heart failure (HCC) 12/16/2019   Anemia    Angio-edema    Arthritis    Atrial fibrillation and flutter (HCC) 08/17/2015   A. S/p failed DCCV // b. Severe BAE on echo >> rate control strategy (has seen AF clinic) // c. Xarelto  for anticoag (CHADS2-VASc=2 / CHF, HTN)   Chronic diastolic CHF (congestive heart failure) (HCC) 08/30/2015   A. Echo 6/17: Apical HK, moderate focal basal and mild concentric LVH, EF 50-55, diffuse HK, trivial MR, severe BAE, mild TR, PASP 37   COPD (chronic obstructive pulmonary disease) (HCC)    Dependence on continuous supplemental oxygen     3L   Dyspnea    Dysrhythmia    Afib   Hematuria 01/10/2021   Hepatic fibrosis 02/29/2016   F3/4 on elastography.    Hepatitis C, chronic (HCC) 08/19/2015   Hepatitis C, chronic (HCC) 08/19/2015   Treated in 2017 with SVR   History of cardiac catheterization    a. LHC 6/17: LAD irregs, o/w no CAD   History of kidney stones    Hypertension    Hyposmia 12/22/2017   OSA (obstructive sleep apnea) 11/21/2015   No Cpap   Oxygen  dependent    Pleural effusion on right 03/14/2018   Pre-diabetes    Sleep apnea    Tobacco abuse     Assessment: Patient Reported Symptoms:  Cognitive Cognitive Status: Able to follow simple commands, Alert and oriented to person, place, and time, Normal speech and language skills Cognitive/Intellectual Conditions Management [RPT]: None reported or documented in medical history or problem list   Health Maintenance Behaviors: Annual physical exam, Exercise, Healthy diet,  Immunizations Health Facilitated by: Healthy diet, Rest  Neurological Neurological Review of Symptoms: Not assessed    HEENT HEENT Symptoms Reported: Not assessed      Cardiovascular Cardiovascular Symptoms Reported: No symptoms reported Does patient have uncontrolled Hypertension?: No Cardiovascular Management Strategies: Medication therapy, Routine screening, Weight management, Diet modification Do You Have a Working Readable Scale?: Yes Weight: 300 lb (136.1 kg) (Patient reported)  Respiratory Respiratory Symptoms Reported: No symptoms reported Additional Respiratory Details: confirmed patient has transportation to [place] chest scheduled 11/18 Respiratory Management Strategies: Medication therapy, Routine screening  Endocrine Endocrine Symptoms Reported: Not assessed Is patient diabetic?: No    Gastrointestinal Gastrointestinal Symptoms Reported: Constipation Additional Gastrointestinal Details: Patient reports continued issues with constipation. He has not been able to get Miralax  from the pharmacy due to insurance not covering medication. He reports he does have stool softeners. Advised trying warm prune juice or lemon water . Gastrointestinal Management Strategies: Medication therapy, Coping strategies Gastrointestinal Comment: Patient reports he has not rescheduled colonoscopy. 3-way call placed to GI office. They will send message to GI provider and RN to get scheduled.    Genitourinary Genitourinary Symptoms Reported: No symptoms reported Additional Genitourinary Details: Patient denies blood in his urine Genitourinary Comment: Patient reports he has not heard from urology regarding referral. 3-way call placed to urology office, but unable to get anyone on the line. Provided patient with office phone number to try again later.  Integumentary Integumentary Symptoms  Reported: Not assessed    Musculoskeletal Musculoskelatal Symptoms Reviewed: Not assessed        Psychosocial  Psychosocial Symptoms Reported: Not assessed          01/30/2024    PHQ2-9 Depression Screening   Little interest or pleasure in doing things    Feeling down, depressed, or hopeless    PHQ-2 - Total Score    Trouble falling or staying asleep, or sleeping too much    Feeling tired or having little energy    Poor appetite or overeating     Feeling bad about yourself - or that you are a failure or have let yourself or your family down    Trouble concentrating on things, such as reading the newspaper or watching television    Moving or speaking so slowly that other people could have noticed.  Or the opposite - being so fidgety or restless that you have been moving around a lot more than usual    Thoughts that you would be better off dead, or hurting yourself in some way    PHQ2-9 Total Score    If you checked off any problems, how difficult have these problems made it for you to do your work, take care of things at home, or get along with other people    Depression Interventions/Treatment      There were no vitals filed for this visit.    Medications Reviewed Today     Reviewed by Arno Rosaline SQUIBB, RN (Registered Nurse) on 01/30/24 at 1227  Med List Status: <None>   Medication Order Taking? Sig Documenting Provider Last Dose Status Informant  albuterol  (VENTOLIN  HFA) 108 (90 Base) MCG/ACT inhaler [id number]  Inhale 2 puffs into the lungs every 6 (six) hours as needed for wheezing or shortness of breath. Seawell, Jaimie A, DO  Active Self, Pharmacy Records  Azelastine -Fluticasone  (DYMISTA ) 137-50 MCG/ACT SUSP [id number]  Place 1 spray into both nostrils 2 (two) times daily as needed (runny or stuffy nose). Jeneal Danita Macintosh, MD  Active   Azelastine -Fluticasone  137-50 MCG/ACT CONCHETTA [id number]  Place 1 spray into the nose 2 (two) times daily as needed (Runny or stuffy nose). Jeneal Danita Macintosh, MD  Active   cromolyn  (OPTICROM ) 4 % ophthalmic solution [id number]  Place 2 drops  into both eyes 4 (four) times daily as needed (Itchy watery eyes). Jeneal Danita Macintosh, MD  Active   dapagliflozin  propanediol (FARXIGA ) 10 MG TABS tablet [id number]  TAKE 1 TABLET(10 MG) BY MOUTH DAILY BEFORE BREAKFAST Nooruddin, Saad, MD  Active   diltiazem  (CARDIZEM  CD) 240 MG 24 hr capsule [id number]  TAKE 2 CAPSULES(480 MG) BY MOUTH DAILY Okey Vina GAILS, MD  Active   fluticasone  (FLOVENT  HFA) 110 MCG/ACT inhaler [id number]  Inhale 1 puff into the lungs daily. Jolaine Pac, DO  Active   levocetirizine (XYZAL ) 5 MG tablet [id number]  Take 1 tablet (5 mg total) by mouth every evening. Jeneal Danita Macintosh, MD  Active   metolazone  (ZAROXOLYN ) 2.5 MG tablet [id number]  Take 1 tablet (2.5 mg total) by mouth once a week. Take every Wednesday. Koomson, Julius, MD  Active   nicotine  polacrilex (NICORETTE ) 2 MG gum [id number]  Take 1 each (2 mg total) by mouth as needed for smoking cessation.  Patient not taking: Reported on 01/30/2024   D'Mello, Rosalyn, DO  Active   polyethylene glycol (MIRALAX ) 17 g packet [id number]  Take 17 g by mouth daily.  Patient not taking: Reported on 01/30/2024   D'Mello, Rosalyn,  DO  Active   potassium chloride  SA (KLOR-CON  M) 20 MEQ tablet [id number]  Take 2 tablets (40 mEq total) by mouth daily. Nooruddin, Saad, MD  Expired 01/02/24 2359   senna (SENOKOT) 8.6 MG TABS tablet [id number]  Take 1 tablet (8.6 mg total) by mouth daily. D'Mello, Rosalyn, DO  Active   torsemide  (DEMADEX ) 20 MG tablet [id number]  Take 1 tablet (20 mg total) by mouth daily. CAN TAKE AN ADDITIONAL TABLET FOR SHORTNESS OF BREATH OR INCREASED SWELLING Wyn Jackee VEAR Mickey., NP  Active   umeclidinium-vilanterol (ANORO ELLIPTA ) 62.5-25 MCG/ACT AEPB [id number]  Inhale 1 puff into the lungs daily. Koomson, Julius, MD  Active   XARELTO  20 MG TABS tablet [id number]  TAKE 1 TABLET BY MOUTH EVERY DAY WITH SUPPER Okey Vina GAILS, MD  Active             Recommendation:   Continue Current Plan of  Care Reschedule missed PCP follow-up Schedule colonoscopy Schedule appointment with urology  Follow Up Plan:   Telephone follow up appointment date/time:  02/13/24 at 11 AM  Rosaline Finlay, RN MSN [place]  [facility] RN Care Manager Direct Dial: [phone]  Fax: [phone]

## 2024-01-30 NOTE — Telephone Encounter (Signed)
 The following patient is calling to find out if he can reschedule his colonoscopy in the hospital. Please review and advise.

## 2024-02-02 NOTE — Telephone Encounter (Signed)
 His procedures were canceled last year because he was having volume overload from exacerbation of CHF.  (Multiple complex medical conditions).  He needs to be scheduled for a next available clinic visit with one of our APP's to reevaluate his condition and determine if he is in sufficient condition for elective endoscopic procedures.  VEAR Brand MD

## 2024-02-02 NOTE — Telephone Encounter (Signed)
 Dr Legrand Do you want the patient re-evaluated in office or can he be added to the hospital procedure list? Last seen in office 11/13/22. He was scheduled for an EGD and colonoscopy. Procedures were canceled by patient.

## 2024-02-03 ENCOUNTER — Ambulatory Visit (HOSPITAL_BASED_OUTPATIENT_CLINIC_OR_DEPARTMENT_OTHER)
Admission: RE | Admit: 2024-02-03 | Discharge: 2024-02-03 | Disposition: A | Discharge status: Home / Self Care | Source: Ambulatory Visit | Attending: Internal Medicine | Admitting: Internal Medicine

## 2024-02-03 DIAGNOSIS — F17200 Nicotine dependence, unspecified, uncomplicated: Secondary | ICD-10-CM | POA: Insufficient documentation

## 2024-02-03 NOTE — Telephone Encounter (Signed)
 Thank you for your help getting this patient an appointment.

## 2024-02-03 NOTE — Telephone Encounter (Signed)
 Spoke with the patient. He is willing to schedule an appointment with Dr Legrand or an APP but will call us  back. He is not at home at the moment.

## 2024-02-03 NOTE — Telephone Encounter (Signed)
 Patient has been scheduled for January the 9 th at 1:20 PM. Please advise.

## 2024-02-06 ENCOUNTER — Other Ambulatory Visit: Payer: Self-pay

## 2024-02-06 NOTE — Patient Instructions (Signed)
 Visit Information  Frank Morales was given information about Medicaid Managed Care team care coordination services as a part of their [facility] Community Plan Medicaid benefit.   If you would like to schedule transportation through your [facility], please call the following number at least 2 days in advance of your appointment: [phone]   Rides for urgent appointments can also be made after hours by calling Member Services.  Call the Behavioral Health Crisis Line at [phone], at any time, 24 hours a day, 7 days a week. If you are in danger or need immediate medical attention call 911.  Please see education materials related to food resources provided by mail.   Patient verbalizes understanding of instructions and care plan provided today and agrees to view in MyChart. Active MyChart status and patient understanding of how to access instructions and care plan via MyChart confirmed with patient.     No further follow up required: Patient did not have other SDOH need interventions.  Frank Morales, BSW [place]/VBCI - [employer] Social Worker [phone]   Following is a copy of your plan of care:  There are no care plans that you recently modified to display for this patient.

## 2024-02-06 NOTE — Patient Outreach (Signed)
.  Social Drivers of Health  Community Resource and Care Coordination Visit Note   02/06/2024  Name: Frank Morales MRN: [id number] DOB:26-Dec-1960  Situation: Referral received for [facility] needs assessment and assistance related to Transportation Financial [employer] . I obtained verbal consent from Patient.  Visit completed with Patient on the phone.   Background:   SDOH Interventions Today    Flowsheet Row Most Recent Value  SDOH Interventions   Food Insecurity Interventions Community Resources Provided  [BSW provided resources for food agian.]  Housing Interventions Intervention Not Indicated  Transportation Interventions Patient Resources (Friends/Family), Payor Benefit  [rely on friends for othre non-medical transportation or family members.]  Utilities Interventions Intervention Not Indicated     Assessment:  Patient recently received letter from [facility] stating he would be getting retirement on top of SSI. BSW encouraged patient to call SSA regarding a question he had in regard to medicare premium being charged out of his retirement benefit.   BSW offered SCAT to help with transportation barrier but patient stated he wanted to hold off for now on that.   Goals Addressed             This Visit's Progress    COMPLETED: BSW Goal       Current SDOH Barriers:  Transportation Limited access to food Mold/home repair  Interventions: Patient interviewed and appropriate screenings performed Referred patient to community resources  Provided patient with information about out of the garden food forensic scientist, community housing solutions, and SCAT transportation through [employer] Advised patient to f/u with community housing solutions re possible assistance with mold issue in the home.  Pt declined SCAT application at this time.           Recommendation:   attend all scheduled provider appointments call for transportation assistance at least one week before  appointments ask for help if you don't understand your health insurance benefits call and/or follow up with out of the garden food project for food assistance  Follow Up Plan:   Patient declines further calls or assistance. [employer] will be closed. Patient has been provided contact information should new needs arise.   Frank Morales, BSW [place]/VBCI - [employer] Social Worker [phone]

## 2024-02-07 DIAGNOSIS — I5032 Chronic diastolic (congestive) heart failure: Secondary | ICD-10-CM | POA: Diagnosis not present

## 2024-02-13 ENCOUNTER — Other Ambulatory Visit: Payer: Self-pay

## 2024-02-13 NOTE — Patient Outreach (Signed)
 Complex Care Management   Visit Note  02/13/2024  Name:  Frank Morales MRN: [id number] DOB: [birth year]  Situation: Referral received for Complex Care Management related to Heart Failure and COPD I obtained verbal consent from Patient.  Visit completed with Patient  on the phone  Background:   Past Medical History:  Diagnosis Date   Acute exacerbation of CHF (congestive heart failure) (HCC) 12/15/2019   Acute heart failure (HCC) 12/16/2019   Anemia    Angio-edema    Arthritis    Atrial fibrillation and flutter (HCC) 08/17/2015   A. S/p failed DCCV // b. Severe BAE on echo >> rate control strategy (has seen AF clinic) // c. Xarelto  for anticoag (CHADS2-VASc=2 / CHF, HTN)   Chronic diastolic CHF (congestive heart failure) (HCC) 08/30/2015   A. Echo 6/17: Apical HK, moderate focal basal and mild concentric LVH, EF 50-55, diffuse HK, trivial MR, severe BAE, mild TR, PASP 37   COPD (chronic obstructive pulmonary disease) (HCC)    Dependence on continuous supplemental oxygen     3L   Dyspnea    Dysrhythmia    Afib   Hematuria 01/10/2021   Hepatic fibrosis 02/29/2016   F3/4 on elastography.    Hepatitis C, chronic (HCC) 08/19/2015   Hepatitis C, chronic (HCC) 08/19/2015   Treated in 2017 with SVR   History of cardiac catheterization    a. LHC 6/17: LAD irregs, o/w no CAD   History of kidney stones    Hypertension    Hyposmia 12/22/2017   OSA (obstructive sleep apnea) 11/21/2015   No Cpap   Oxygen  dependent    Pleural effusion on right 03/14/2018   Pre-diabetes    Sleep apnea    Tobacco abuse     Assessment: Patient Reported Symptoms:  Cognitive Cognitive Status: Able to follow simple commands, Alert and oriented to person, place, and time, Normal speech and language skills Cognitive/Intellectual Conditions Management [RPT]: None reported or documented in medical history or problem list   Health Maintenance Behaviors: Annual physical exam, Exercise, Healthy diet,  Immunizations Health Facilitated by: Healthy diet, Rest  Neurological Neurological Review of Symptoms: No symptoms reported    HEENT HEENT Symptoms Reported: No symptoms reported      Cardiovascular Cardiovascular Symptoms Reported: No symptoms reported Does patient have uncontrolled Hypertension?: No Cardiovascular Management Strategies: Medication therapy, Routine screening, Weight management, Diet modification Do You Have a Working Readable Scale?: Yes Weight: (!) 308 lb (139.7 kg) (Patient reported) Cardiovascular Comment: Patient denies 2 lb weight increase overnight or 5 lbs in a week. He blames 8 lb weight increase on eating more than normal during Thanksgiving meal yesterday. Reviewed red flag symptoms to monitor for  Respiratory Respiratory Symptoms Reported: No symptoms reported Additional Respiratory Details: Patient reports he is on 3L O2 continuously. Confirmed patient completed CT chest 02/03/24. Patient reports he still has not been able to get nicotine  gum due to insurance not covering it. Coupon through [employer] texted to [employer]. Respiratory Management Strategies: Medication therapy, Oxygen  therapy, Routine screening  Endocrine Endocrine Symptoms Reported: Not assessed Is patient diabetic?: No    Gastrointestinal Gastrointestinal Symptoms Reported: No symptoms reported Additional Gastrointestinal Details: Patient reports he was able to get laxative and has been having regular BMs. Last BM yesterday Gastrointestinal Management Strategies: Coping strategies, Medication therapy Gastrointestinal Comment: Note per chart review patient is scheduled with GI 03/27/23 to reevaluate colonoscopy.    Genitourinary Genitourinary Symptoms Reported: No symptoms reported Genitourinary Comment: Patient reports he scheduled an appointment with  urology, but does not remember when it is off of the top of his head  Integumentary Integumentary Symptoms Reported: No symptoms reported     Musculoskeletal Musculoskelatal Symptoms Reviewed: No symptoms reported Musculoskeletal Management Strategies: Adequate rest, Exercise, Medical device (Cane) Musculoskeletal Comment: Patient denies falls since previous CMRN visit Falls in the past year?: No Number of falls in past year: 1 or less Was there an injury with Fall?: No Fall Risk Category Calculator: 0 Patient Fall Risk Level: Low Fall Risk Patient at Risk for Falls Due to: No Fall Risks Fall risk Follow up: Falls evaluation completed, Education provided, Falls prevention discussed  Psychosocial Psychosocial Symptoms Reported: Not assessed          02/13/2024    PHQ2-9 Depression Screening   Little interest or pleasure in doing things    Feeling down, depressed, or hopeless    PHQ-2 - Total Score    Trouble falling or staying asleep, or sleeping too much    Feeling tired or having little energy    Poor appetite or overeating     Feeling bad about yourself - or that you are a failure or have let yourself or your family down    Trouble concentrating on things, such as reading the newspaper or watching television    Moving or speaking so slowly that other people could have noticed.  Or the opposite - being so fidgety or restless that you have been moving around a lot more than usual    Thoughts that you would be better off dead, or hurting yourself in some way    PHQ2-9 Total Score    If you checked off any problems, how difficult have these problems made it for you to do your work, take care of things at home, or get along with other people    Depression Interventions/Treatment      Today's Vitals   02/13/24 1111  Weight: (!) 308 lb (139.7 kg)      Medications Reviewed Today     Reviewed by Arno Rosaline SQUIBB, RN (Registered Nurse) on 02/13/24 at 1102  Med List Status: <None>   Medication Order Taking? Sig Documenting Provider Last Dose Status Informant  albuterol  (VENTOLIN  HFA) 108 (90 Base) MCG/ACT inhaler  [id number]  Inhale 2 puffs into the lungs every 6 (six) hours as needed for wheezing or shortness of breath. Seawell, Jaimie A, DO  Active Self, Pharmacy Records  Azelastine -Fluticasone  (DYMISTA ) 137-50 MCG/ACT SUSP [id number]  Place 1 spray into both nostrils 2 (two) times daily as needed (runny or stuffy nose). Jeneal Danita Macintosh, MD  Active   Azelastine -Fluticasone  137-50 MCG/ACT CONCHETTA [id number]  Place 1 spray into the nose 2 (two) times daily as needed (Runny or stuffy nose). Jeneal Danita Macintosh, MD  Active   cromolyn  (OPTICROM ) 4 % ophthalmic solution [id number]  Place 2 drops into both eyes 4 (four) times daily as needed (Itchy watery eyes). Jeneal Danita Macintosh, MD  Active   dapagliflozin  propanediol (FARXIGA ) 10 MG TABS tablet [id number]  TAKE 1 TABLET(10 MG) BY MOUTH DAILY BEFORE BREAKFAST Nooruddin, Saad, MD  Active   diltiazem  (CARDIZEM  CD) 240 MG 24 hr capsule [id number]  TAKE 2 CAPSULES(480 MG) BY MOUTH DAILY Okey Vina GAILS, MD  Active   fluticasone  (FLOVENT  HFA) 110 MCG/ACT inhaler [id number]  Inhale 1 puff into the lungs daily. Jolaine Pac, DO  Active   levocetirizine (XYZAL ) 5 MG tablet [id number]  Take 1 tablet (5 mg total) by mouth every evening. Jeneal Danita  Avelina, MD  Active   metolazone  (ZAROXOLYN ) 2.5 MG tablet [id number]  Take 1 tablet (2.5 mg total) by mouth once a week. Take every Wednesday. Koomson, Julius, MD  Active   nicotine  polacrilex (NICORETTE ) 2 MG gum [id number]  Take 1 each (2 mg total) by mouth as needed for smoking cessation.  Patient not taking: Reported on 01/30/2024   D'Mello, Rosalyn, DO  Active   polyethylene glycol (MIRALAX ) 17 g packet [id number]  Take 17 g by mouth daily.  Patient not taking: Reported on 01/30/2024   D'Mello, Rosalyn, DO  Active   potassium chloride  SA (KLOR-CON  M) 20 MEQ tablet [id number]  Take 2 tablets (40 mEq total) by mouth daily. Nooruddin, Saad, MD  Expired 01/02/24 2359   senna (SENOKOT) 8.6 MG TABS tablet  [id number]  Take 1 tablet (8.6 mg total) by mouth daily. D'Mello, Rosalyn, DO  Active   torsemide  (DEMADEX ) 20 MG tablet [id number]  Take 1 tablet (20 mg total) by mouth daily. CAN TAKE AN ADDITIONAL TABLET FOR SHORTNESS OF BREATH OR INCREASED SWELLING Wyn Jackee VEAR Mickey., NP  Active   umeclidinium-vilanterol (ANORO ELLIPTA ) 62.5-25 MCG/ACT AEPB [id number]  Inhale 1 puff into the lungs daily. Koomson, Julius, MD  Active   XARELTO  20 MG TABS tablet [id number]  TAKE 1 TABLET BY MOUTH EVERY DAY WITH SUPPER Okey Vina GAILS, MD  Active             Recommendation:   Continue Current Plan of Care Take GoodRx coupon to the pharmacy to purchase nicotine  gum  Follow Up Plan:   Telephone follow up appointment date/time:  03/12/24 at 11 AM  Rosaline Finlay, RN MSN [place]  [facility] RN Care Manager Direct Dial: [phone]  Fax: [phone]

## 2024-02-13 NOTE — Patient Instructions (Signed)
 Visit Information  [name] was given information about Medicaid Managed Care team care coordination services as a part of their [facility] Community Plan Medicaid benefit.   If you would like to schedule transportation through your [facility], please call the following number at least 2 days in advance of your appointment: [phone]   Rides for urgent appointments can also be made after hours by calling Member Services.  Call the Behavioral Health Crisis Line at [phone], at any time, 24 hours a day, 7 days a week. If you are in danger or need immediate medical attention call 911.  Care plan and visit instructions communicated with the patient verbally today. The patient DECLINED to receive copy of care plan and patient instructions in any format.   Telephone follow up appointment with Managed Medicaid care management team member scheduled for: 03/12/24 at 11 AM  Rosaline Finlay, RN MSN [place]  [facility] RN Care Manager Direct Dial: [phone]  Fax: [phone]   Following is a copy of your plan of care:  There are no care plans that you recently modified to display for this patient.

## 2024-03-12 ENCOUNTER — Other Ambulatory Visit: Payer: Self-pay

## 2024-03-12 NOTE — Patient Outreach (Signed)
 Complex Care Management   Visit Note  03/12/2024  Name:  Frank Morales MRN: [id number] DOB: 01/26/1961  Situation: Referral received for Complex Care Management related to Heart Failure and COPD I obtained verbal consent from Patient.  Visit completed with Patient  on the phone  Background:   Past Medical History:  Diagnosis Date   Acute exacerbation of CHF (congestive heart failure) (HCC) 12/15/2019   Acute heart failure (HCC) 12/16/2019   Anemia    Angio-edema    Arthritis    Atrial fibrillation and flutter (HCC) 08/17/2015   A. S/p failed DCCV // b. Severe BAE on echo >> rate control strategy (has seen AF clinic) // c. Xarelto  for anticoag (CHADS2-VASc=2 / CHF, HTN)   Chronic diastolic CHF (congestive heart failure) (HCC) 08/30/2015   A. Echo 6/17: Apical HK, moderate focal basal and mild concentric LVH, EF 50-55, diffuse HK, trivial MR, severe BAE, mild TR, PASP 37   COPD (chronic obstructive pulmonary disease) (HCC)    Dependence on continuous supplemental oxygen     3L   Dyspnea    Dysrhythmia    Afib   Hematuria 01/10/2021   Hepatic fibrosis 02/29/2016   F3/4 on elastography.    Hepatitis C, chronic (HCC) 08/19/2015   Hepatitis C, chronic (HCC) 08/19/2015   Treated in 2017 with SVR   History of cardiac catheterization    a. LHC 6/17: LAD irregs, o/w no CAD   History of kidney stones    Hypertension    Hyposmia 12/22/2017   OSA (obstructive sleep apnea) 11/21/2015   No Cpap   Oxygen  dependent    Pleural effusion on right 03/14/2018   Pre-diabetes    Sleep apnea    Tobacco abuse     Assessment: Patient Reported Symptoms:  Cognitive Cognitive Status: Able to follow simple commands, Alert and oriented to person, place, and time, Normal speech and language skills Cognitive/Intellectual Conditions Management [RPT]: None reported or documented in medical history or problem list   Health Maintenance Behaviors: Annual physical exam, Exercise, Healthy diet,  Immunizations Health Facilitated by: Healthy diet, Rest  Neurological Neurological Review of Symptoms: Not assessed    HEENT HEENT Symptoms Reported: Other: HEENT Management Strategies: Medical device, Routine screening    Cardiovascular Cardiovascular Symptoms Reported: No symptoms reported Does patient have uncontrolled Hypertension?: No Cardiovascular Management Strategies: Medication therapy, Routine screening, Weight management, Diet modification Do You Have a Working Readable Scale?: Yes Weight: (!) 302 lb (137 kg) (Patient reported)  Respiratory Respiratory Symptoms Reported: Productive cough Additional Respiratory Details: Patient reports gray-ish green mucus production and a little wheezing. He reports he has not been using Albuterol  recently, but does have some on hand. Patient confirms compliance with daily inhalers and oxygen  at 3 L. Patient reports he was unable to use coupon to obtain nicotine  gum. He reports pharmacist tried to find other routes to obtain nicotine  gum, but it still cost too much. Patient reports smoking 3-4 cigarettes a day at this time Respiratory Management Strategies: Medication therapy, Oxygen  therapy, Routine screening  Endocrine Endocrine Symptoms Reported: Not assessed    Gastrointestinal Gastrointestinal Symptoms Reported: Constipation Additional Gastrointestinal Details: Patient reports continued issues with constipation despite compliance with Miralax . He reports he does not take Senna. Patient reports good fluid and fiber intake. Last BM yesterday, with BMs about 1-2 times a week Gastrointestinal Management Strategies: Coping strategies, Medication therapy Gastrointestinal Comment: Reminded patient of upcoming GI follow-up 03/26/24    Genitourinary Genitourinary Symptoms Reported: No symptoms reported Additional Genitourinary  Details: Patient denies blood in his urine Genitourinary Comment: Patient reports he has had visit with urology, and next  follow-up isn't for another 6 months  Integumentary Integumentary Symptoms Reported: Not assessed    Musculoskeletal Musculoskelatal Symptoms Reviewed: No symptoms reported Musculoskeletal Management Strategies: Adequate rest, Exercise, Medical device (Cane) Musculoskeletal Comment: Patient denies falls since previous CMRN visit Falls in the past year?: No Number of falls in past year: 1 or less Was there an injury with Fall?: No Fall Risk Category Calculator: 0 Patient Fall Risk Level: Low Fall Risk Patient at Risk for Falls Due to: No Fall Risks Fall risk Follow up: Falls evaluation completed, Education provided, Falls prevention discussed  Psychosocial Psychosocial Symptoms Reported: No symptoms reported          03/12/2024    PHQ2-9 Depression Screening   Little interest or pleasure in doing things Not at all  Feeling down, depressed, or hopeless Not at all  PHQ-2 - Total Score 0  Trouble falling or staying asleep, or sleeping too much    Feeling tired or having little energy    Poor appetite or overeating     Feeling bad about yourself - or that you are a failure or have let yourself or your family down    Trouble concentrating on things, such as reading the newspaper or watching television    Moving or speaking so slowly that other people could have noticed.  Or the opposite - being so fidgety or restless that you have been moving around a lot more than usual    Thoughts that you would be better off dead, or hurting yourself in some way    PHQ2-9 Total Score    If you checked off any problems, how difficult have these problems made it for you to do your work, take care of things at home, or get along with other people    Depression Interventions/Treatment      Today's Vitals   03/12/24 1110  Weight: (!) 302 lb (137 kg)   Pain Scale: 0-10 Pain Score: 0-No pain  Medications Reviewed Today     Reviewed by Arno Rosaline SQUIBB, RN (Registered Nurse) on 03/12/24 at 1105   Med List Status: <None>   Medication Order Taking? Sig Documenting Provider Last Dose Status Informant  albuterol  (VENTOLIN  HFA) 108 (90 Base) MCG/ACT inhaler [id number]  Inhale 2 puffs into the lungs every 6 (six) hours as needed for wheezing or shortness of breath. Seawell, Jaimie A, DO  Active Self, Pharmacy Records  Azelastine -Fluticasone  (DYMISTA ) 137-50 MCG/ACT SUSP [id number]  Place 1 spray into both nostrils 2 (two) times daily as needed (runny or stuffy nose).  Patient not taking: Reported on 03/12/2024   Jeneal Danita Macintosh, MD  Active   Azelastine -Fluticasone  137-50 MCG/ACT CONCHETTA [id number]  Place 1 spray into the nose 2 (two) times daily as needed (Runny or stuffy nose).  Patient not taking: Reported on 03/12/2024   Jeneal Danita Macintosh, MD  Active   cromolyn  (OPTICROM ) 4 % ophthalmic solution [id number]  Place 2 drops into both eyes 4 (four) times daily as needed (Itchy watery eyes). Jeneal Danita Macintosh, MD  Active   dapagliflozin  propanediol (FARXIGA ) 10 MG TABS tablet [id number]  TAKE 1 TABLET(10 MG) BY MOUTH DAILY BEFORE BREAKFAST Nooruddin, Saad, MD  Active   diltiazem  (CARDIZEM  CD) 240 MG 24 hr capsule [id number]  TAKE 2 CAPSULES(480 MG) BY MOUTH DAILY Okey Vina GAILS, MD  Active   fluticasone  (FLOVENT  HFA) 110 MCG/ACT inhaler [id number]  Yes Inhale 1 puff into the lungs daily. Jolaine Pac, DO  Active   levocetirizine (XYZAL ) 5 MG tablet [id number]  Take 1 tablet (5 mg total) by mouth every evening.  Patient not taking: Reported on 03/12/2024   Jeneal Danita Macintosh, MD  Active   metolazone  (ZAROXOLYN ) 2.5 MG tablet [id number]  Take 1 tablet (2.5 mg total) by mouth once a week. Take every Wednesday. Koomson, Julius, MD  Active   nicotine  polacrilex (NICORETTE ) 2 MG gum [id number]  Take 1 each (2 mg total) by mouth as needed for smoking cessation.  Patient not taking: Reported on 01/30/2024   D'Mello, Rosalyn, DO  Active   polyethylene glycol (MIRALAX ) 17 g packet  [id number]  Take 17 g by mouth daily. D'Mello, Rosalyn, DO  Active   potassium chloride  SA (KLOR-CON  M) 20 MEQ tablet [id number]  Take 2 tablets (40 mEq total) by mouth daily. Nooruddin, Saad, MD  Expired 01/02/24 2359   senna (SENOKOT) 8.6 MG TABS tablet [id number]  Take 1 tablet (8.6 mg total) by mouth daily. D'Mello, Rosalyn, DO  Active   torsemide  (DEMADEX ) 20 MG tablet [id number]  Take 1 tablet (20 mg total) by mouth daily. CAN TAKE AN ADDITIONAL TABLET FOR SHORTNESS OF BREATH OR INCREASED SWELLING Wyn Jackee VEAR Mickey., NP  Active   umeclidinium-vilanterol (ANORO ELLIPTA ) 62.5-25 MCG/ACT AEPB [id number]  Inhale 1 puff into the lungs daily. Celestina Czar, MD  Active   XARELTO  20 MG TABS tablet [id number]  TAKE 1 TABLET BY MOUTH EVERY DAY WITH SUPPER Okey Vina GAILS, MD  Active             Recommendation:   Continue Current Plan of Care  Follow Up Plan:   Telephone follow up appointment date/time:  04/09/24 at 11 AM  Rosaline Finlay, RN MSN [place]  [facility] RN Care Manager Direct Dial: [phone]  Fax: [phone]

## 2024-03-12 NOTE — Patient Instructions (Signed)
 Visit Information  [name] was given information about Medicaid Managed Care team care coordination services as a part of their [facility] Community Plan Medicaid benefit.   If you would like to schedule transportation through your [facility], please call the following number at least 2 days in advance of your appointment: [phone]   Rides for urgent appointments can also be made after hours by calling Member Services.  Call the Behavioral Health Crisis Line at [phone], at any time, 24 hours a day, 7 days a week. If you are in danger or need immediate medical attention call 911.  Care plan and visit instructions communicated with the patient verbally today. The patient DECLINED to receive copy of care plan and patient instructions in any format.   Telephone follow up appointment with Managed Medicaid care management team member scheduled for: 04/09/24 at 11 AM  Rosaline Finlay, RN MSN [place]  [facility] RN Care Manager Direct Dial: [phone]  Fax: [phone]   Following is a copy of your plan of care:   Goals Addressed             This Visit's Progress    [facility] RN Care Plan   On track    Problems:  Chronic Disease Management support and education needs related to CHF and COPD  Goal: Over the next 30 days the Patient will demonstrate Ongoing adherence to prescribed treatment plan for COPD as evidenced by patient report of compliance with inhalers and stable respiratory symptoms, patient report of decreased tobacco use verbalize basic understanding of CHF disease process and self health management plan as evidenced by continuing to weigh daily and reporting stable weight with no signs of fluid retention  Interventions:   Heart Failure Interventions: Basic overview and discussion of pathophysiology of Heart Failure reviewed Provided education on low sodium diet Reviewed Heart Failure Action Plan in depth and  provided written copy Advised patient to weigh each morning after emptying bladder Discussed importance of daily weight and advised patient to weigh and record daily Reviewed role of diuretics in prevention of fluid overload and management of heart failure; Reviewed red flag symptoms to monitor for and advised to contact cardiologist, PCP, or CMRN for symptoms   COPD Interventions: Advised patient to track and manage COPD triggers Provided education about and advised patient to utilize infection prevention strategies to reduce risk of respiratory infection Provided instruction about proper use of medications used for management of COPD including inhalers Use of home oxygen  Reviewed the importance of tobacco cessation Advised limiting time spend outdoors during weather changes Encouraged continued efforts towards tobacco cessation  General Interventions Advised patient to discuss ongoing constipation with provider Screening for signs and symptoms of depression related to chronic disease state  Message sent to [facility] regarding patient request for information regarding MOW Provided patient with allergist contact information and encouraged to call office today regarding allergy/sinus symptoms  Patient Self-Care Activities:  Attend all scheduled provider appointments Call pharmacy for medication refills 3-7 days in advance of running out of medications Call provider office for new concerns or questions  Perform all self care activities independently  Perform IADL's (shopping, preparing meals, housekeeping, managing finances) independently Take medications as prescribed   call office if I gain more than 2 pounds in one day or 5 pounds in one week track weight in diary use salt in moderation watch for swelling in feet, ankles and legs every day weigh myself daily develop a rescue plan  limit outdoor activity during cold weather listen for public air quality announcements every day begin a  symptom diary develop a rescue plan Continue with efforts towards tobacco cessation  Plan:  Telephone follow up appointment with care management team member scheduled for:  04/09/24 at 11 AM

## 2024-03-15 ENCOUNTER — Other Ambulatory Visit: Payer: Self-pay | Admitting: *Deleted

## 2024-03-15 MED ORDER — FLUTICASONE PROPIONATE 50 MCG/ACT NA SUSP: 2.0000 | Freq: Every day | NASAL | 5 refills | Status: AC

## 2024-03-15 MED ORDER — AZELASTINE HCL 0.1 % NA SOLN: 1.0000 | Freq: Two times a day (BID) | NASAL | 5 refills | Status: AC | PRN

## 2024-03-26 ENCOUNTER — Encounter: Payer: Self-pay | Admitting: Gastroenterology

## 2024-03-26 ENCOUNTER — Ambulatory Visit: Admitting: Gastroenterology

## 2024-04-09 ENCOUNTER — Telehealth: Payer: Self-pay

## 2024-04-09 NOTE — Patient Outreach (Signed)
 Care Coordination   04/09/2024 Name: RAMIL EDGINGTON MRN: [id number] DOB: 12-30-1960   Care Coordination Outreach Attempts:  An unsuccessful outreach was attempted for an appointment today.  Follow Up Plan:  Additional outreach attempts will be made to complete CCM follow-up visit.   Encounter Outcome:  No Answer. HIPAA compliant voicemail left requesting return call.   Rosaline Finlay, RN MSN [place]  [facility] RN Care Manager Direct Dial: [phone]  Fax: [phone]

## 2024-04-12 NOTE — Patient Outreach (Signed)
 Care Coordination   04/12/2024 Name: Frank Morales MRN: [id number] DOB: [birth year]   Care Coordination Outreach Attempts:  A second unsuccessful outreach was attempted today to complete CCM follow-up visit.   Follow Up Plan:  Additional outreach attempts will be made to complete follow-up visit.   Encounter Outcome:  Patient reports he is walking out the door and requests a call back. Follow-up visit rescheduled 04/13/24 at 2:30 PM.    Rosaline Finlay, RN MSN [place]  [facility] RN Care Manager Direct Dial: [phone]  Fax: [phone]

## 2024-04-13 ENCOUNTER — Other Ambulatory Visit: Payer: Self-pay

## 2024-04-13 NOTE — Patient Instructions (Signed)
 Visit Information  Mr. Vecchio was given information about Medicaid Managed Care team care coordination services as a part of their [facility] Community Plan Medicaid benefit.   If you would like to schedule transportation through your [facility], please call the following number at least 2 days in advance of your appointment: [phone]   Rides for urgent appointments can also be made after hours by calling Member Services.  Call the Behavioral Health Crisis Line at [phone], at any time, 24 hours a day, 7 days a week. If you are in danger or need immediate medical attention call 911.  Patient verbalizes understanding of instructions and care plan provided today and agrees to view in MyChart. Active MyChart status and patient understanding of how to access instructions and care plan via MyChart confirmed with patient.     Patient will reschedule missed visits with GI and PCP. and Telephone follow up appointment with Managed Medicaid care management team member scheduled for: 05/11/24 at 2:30 PM  Rosaline Finlay, RN MSN   [facility] RN Care Manager Direct Dial: [phone]  Fax: [phone]   Following is a copy of your plan of care:   Goals Addressed             This Visit's Progress    [facility] RN Care Plan   On track    Problems:  Chronic Disease Management support and education needs related to CHF and COPD  Goal: Over the next 30 days the Patient will demonstrate Ongoing adherence to prescribed treatment plan for COPD as evidenced by patient report of compliance with inhalers and stable respiratory symptoms, patient report of decreased tobacco use verbalize basic understanding of CHF disease process and self health management plan as evidenced by continuing to weigh daily and reporting stable weight with no signs of fluid retention  Interventions:   Heart Failure Interventions: Provided education on low sodium  diet Reviewed Heart Failure Action Plan in depth and provided written copy Assessed need for readable accurate scales in home Provided education about placing scale on hard, flat surface Advised patient to weigh each morning after emptying bladder Discussed importance of daily weight and advised patient to weigh and record daily Reviewed role of diuretics in prevention of fluid overload and management of heart failure; Reviewed red flag symptoms to monitor for and advised to contact cardiologist, PCP, or CMRN for symptoms   COPD Interventions: Advised patient to track and manage COPD triggers Provided education about and advised patient to utilize infection prevention strategies to reduce risk of respiratory infection Provided instruction about proper use of medications used for management of COPD including inhalers Use of home oxygen  Reviewed the importance of tobacco cessation Advised limiting time spend outdoors during extreme cold weather  General Interventions Advised patient to contact GI and PCP offices to reschedule missed visits  Patient Self-Care Activities:  Attend all scheduled provider appointments Call pharmacy for medication refills 3-7 days in advance of running out of medications Call provider office for new concerns or questions  Perform all self care activities independently  Perform IADL's (shopping, preparing meals, housekeeping, managing finances) independently Take medications as prescribed   call office if I gain more than 2 pounds in one day or 5 pounds in one week track weight in diary use salt in moderation watch for swelling in feet, ankles and legs every day weigh myself daily develop a rescue plan limit outdoor activity during cold weather listen for public air quality announcements  every day begin a symptom diary develop a rescue plan Continue with efforts towards tobacco cessation Reschedule missed visits with GI and PCP  Plan:  Telephone  follow up appointment with care management team member scheduled for:  05/11/24 at 2:30 PM

## 2024-04-13 NOTE — Patient Outreach (Signed)
 Complex Care Management   Visit Note  04/13/2024  Name:  Frank Morales MRN: [id number] DOB: [birth year]  Situation: Referral received for Complex Care Management related to Heart Failure and COPD I obtained verbal consent from Patient.  Visit completed with Patient  on the phone  Background:   Past Medical History:  Diagnosis Date   Acute exacerbation of CHF (congestive heart failure) (HCC) 12/15/2019   Acute heart failure (HCC) 12/16/2019   Anemia    Angio-edema    Arthritis    Atrial fibrillation and flutter (HCC) 08/17/2015   A. S/p failed DCCV // b. Severe BAE on echo >> rate control strategy (has seen AF clinic) // c. Xarelto  for anticoag (CHADS2-VASc=2 / CHF, HTN)   Chronic diastolic CHF (congestive heart failure) (HCC) 08/30/2015   A. Echo 6/17: Apical HK, moderate focal basal and mild concentric LVH, EF 50-55, diffuse HK, trivial MR, severe BAE, mild TR, PASP 37   COPD (chronic obstructive pulmonary disease) (HCC)    Dependence on continuous supplemental oxygen     3L   Dyspnea    Dysrhythmia    Afib   Hematuria 01/10/2021   Hepatic fibrosis 02/29/2016   F3/4 on elastography.    Hepatitis C, chronic (HCC) 08/19/2015   Hepatitis C, chronic (HCC) 08/19/2015   Treated in 2017 with SVR   History of cardiac catheterization    a. LHC 6/17: LAD irregs, o/w no CAD   History of kidney stones    Hypertension    Hyposmia 12/22/2017   OSA (obstructive sleep apnea) 11/21/2015   No Cpap   Oxygen  dependent    Pleural effusion on right 03/14/2018   Pre-diabetes    Sleep apnea    Tobacco abuse     Assessment: Patient Reported Symptoms:  Cognitive Cognitive Status: Able to follow simple commands, Alert and oriented to person, place, and time, Normal speech and language skills Cognitive/Intellectual Conditions Management [RPT]: None reported or documented in medical history or problem list   Health Maintenance Behaviors: Annual physical exam, Exercise, Healthy diet,  Immunizations Health Facilitated by: Healthy diet, Rest  Neurological Neurological Review of Symptoms: Not assessed    HEENT HEENT Symptoms Reported: No symptoms reported HEENT Management Strategies: Medication therapy, Routine screening HEENT Comment: Note per chart review patient was able to get in touch with allergy provider and has f/u in March 2026    Cardiovascular Cardiovascular Symptoms Reported: No symptoms reported Does patient have uncontrolled Hypertension?: No Cardiovascular Management Strategies: Medication therapy, Routine screening, Weight management, Diet modification Do You Have a Working Readable Scale?: Yes Weight: (!) 315 lb (142.9 kg) (Patient reported) Cardiovascular Comment: Patient denies symptoms r/t fluid retention. He attributes weight gain to increased intake during the holidays  Respiratory Respiratory Symptoms Reported: Productive cough Additional Respiratory Details: Patient reports continued grayish-green mucus production, but reports it is decreasing. Patient reports PRN Albuterol  use about once a day. Patient reports continued compliance with daily inhalers and oxygen  at 3 L. Respiratory Management Strategies: Medication therapy, Oxygen  therapy, Routine screening  Endocrine Endocrine Symptoms Reported: No symptoms reported Is patient diabetic?: No    Gastrointestinal Gastrointestinal Symptoms Reported: No symptoms reported Additional Gastrointestinal Details: Patient reports issues r/t constipation are resolving. He reports he has been taking Miralax . His last BM was yesterday Gastrointestinal Management Strategies: Coping strategies, Medication therapy Gastrointestinal Comment: Note per chart review patient n/s GI appointment on 03/26/24. Patient reports he wants to wait until weather has cleared up to r/s missed appointment. Reminded patient to also  r/s missed appointment with PCP.    Genitourinary Genitourinary Symptoms Reported: No symptoms reported     Integumentary Integumentary Symptoms Reported: No symptoms reported    Musculoskeletal Musculoskelatal Symptoms Reviewed: No symptoms reported Musculoskeletal Management Strategies: Activity, Exercise, Medical device (Cane) Musculoskeletal Comment: Patient denies falls since previous CMRN visit Falls in the past year?: No Number of falls in past year: 1 or less Was there an injury with Fall?: No Fall Risk Category Calculator: 0 Patient Fall Risk Level: Low Fall Risk Patient at Risk for Falls Due to: No Fall Risks Fall risk Follow up: Falls evaluation completed, Education provided, Falls prevention discussed  Psychosocial Psychosocial Symptoms Reported: Not assessed          04/13/2024    PHQ2-9 Depression Screening   Little interest or pleasure in doing things    Feeling down, depressed, or hopeless    PHQ-2 - Total Score    Trouble falling or staying asleep, or sleeping too much    Feeling tired or having little energy    Poor appetite or overeating     Feeling bad about yourself - or that you are a failure or have let yourself or your family down    Trouble concentrating on things, such as reading the newspaper or watching television    Moving or speaking so slowly that other people could have noticed.  Or the opposite - being so fidgety or restless that you have been moving around a lot more than usual    Thoughts that you would be better off dead, or hurting yourself in some way    PHQ2-9 Total Score    If you checked off any problems, how difficult have these problems made it for you to do your work, take care of things at home, or get along with other people    Depression Interventions/Treatment      Today's Vitals   04/13/24 1445  Weight: (!) 315 lb (142.9 kg)   Pain Scale: 0-10 Pain Score: 0-No pain  Medications Reviewed Today     Reviewed by Arno Rosaline SQUIBB, RN (Registered Nurse) on 04/13/24 at 1448  Med List Status: <None>   Medication Order Taking? Sig  Documenting Provider Last Dose Status Informant  albuterol  (VENTOLIN  HFA) 108 (90 Base) MCG/ACT inhaler [id number]  Inhale 2 puffs into the lungs every 6 (six) hours as needed for wheezing or shortness of breath. Seawell, Jaimie A, DO  Active Self, Pharmacy Records  azelastine  (ASTELIN ) 0.1 % nasal spray [id number]  Place 1 spray into both nostrils 2 (two) times daily as needed. Jeneal Danita Macintosh, MD  Active   Azelastine -Fluticasone  (DYMISTA ) 137-50 MCG/ACT CONCHETTA [id number]  Place 1 spray into both nostrils 2 (two) times daily as needed (runny or stuffy nose).  Patient not taking: Reported on 03/12/2024   Jeneal Danita Macintosh, MD  Active   Azelastine -Fluticasone  137-50 MCG/ACT CONCHETTA [id number]  Place 1 spray into the nose 2 (two) times daily as needed (Runny or stuffy nose).  Patient not taking: Reported on 03/12/2024   Jeneal Danita Macintosh, MD  Active   cromolyn  (OPTICROM ) 4 % ophthalmic solution [id number]  Place 2 drops into both eyes 4 (four) times daily as needed (Itchy watery eyes). Jeneal Danita Macintosh, MD  Active   dapagliflozin  propanediol (FARXIGA ) 10 MG TABS tablet [id number]  TAKE 1 TABLET(10 MG) BY MOUTH DAILY BEFORE BREAKFAST Nooruddin, Saad, MD  Active   diltiazem  (CARDIZEM  CD) 240 MG 24 hr capsule [id number]  TAKE 2 CAPSULES(480 MG)  BY MOUTH DAILY Okey Vina GAILS, MD  Active   fluticasone  (FLONASE ) 50 MCG/ACT nasal spray [id number]  Place 2 sprays into both nostrils daily. Jeneal Danita Macintosh, MD  Active   fluticasone  (FLOVENT  HFA) 110 MCG/ACT inhaler [id number]  Inhale 1 puff into the lungs daily. Jolaine Pac, DO  Active   levocetirizine (XYZAL ) 5 MG tablet [id number]  Take 1 tablet (5 mg total) by mouth every evening.  Patient not taking: Reported on 03/12/2024   Jeneal Danita Macintosh, MD  Active   metolazone  (ZAROXOLYN ) 2.5 MG tablet [id number]  Take 1 tablet (2.5 mg total) by mouth once a week. Take every Wednesday. Koomson, Julius, MD  Active   nicotine   polacrilex (NICORETTE ) 2 MG gum [id number]  Take 1 each (2 mg total) by mouth as needed for smoking cessation.  Patient not taking: Reported on 01/30/2024   D'Mello, Rosalyn, DO  Active   polyethylene glycol (MIRALAX ) 17 g packet [id number]  Take 17 g by mouth daily. D'Mello, Rosalyn, DO  Active   potassium chloride  SA (KLOR-CON  M) 20 MEQ tablet [id number]  Take 2 tablets (40 mEq total) by mouth daily. Nooruddin, Saad, MD  Expired 01/02/24 2359   senna (SENOKOT) 8.6 MG TABS tablet [id number]  Take 1 tablet (8.6 mg total) by mouth daily.  Patient not taking: Reported on 03/12/2024   D'Mello, Rosalyn, DO  Active   torsemide  (DEMADEX ) 20 MG tablet [id number]  Take 1 tablet (20 mg total) by mouth daily. CAN TAKE AN ADDITIONAL TABLET FOR SHORTNESS OF BREATH OR INCREASED SWELLING Wyn Jackee VEAR Mickey., NP  Active   umeclidinium-vilanterol (ANORO ELLIPTA ) 62.5-25 MCG/ACT AEPB [id number]  Inhale 1 puff into the lungs daily. Koomson, Julius, MD  Active   XARELTO  20 MG TABS tablet [id number]  TAKE 1 TABLET BY MOUTH EVERY DAY WITH SUPPER Okey Vina GAILS, MD  Active             Recommendation:   Continue Current Plan of Care Patient has been advised to reschedule missed visits with PCP and GI  Follow Up Plan:   Telephone follow up appointment date/time:  05/11/24 at 2:30 PM  Rosaline Finlay, RN MSN [place]  [facility] RN Care Manager Direct Dial: [phone]  Fax: [phone]

## 2024-04-22 ENCOUNTER — Encounter (HOSPITAL_COMMUNITY): Payer: Self-pay | Admitting: Emergency Medicine

## 2024-04-22 ENCOUNTER — Other Ambulatory Visit: Payer: Self-pay

## 2024-04-22 ENCOUNTER — Emergency Department (HOSPITAL_COMMUNITY)
Admission: EM | Admit: 2024-04-22 | Discharge: 2024-04-23 | Disposition: A | Source: Home / Self Care | Attending: Emergency Medicine | Admitting: Emergency Medicine

## 2024-04-22 DIAGNOSIS — T300 Burn of unspecified body region, unspecified degree: Secondary | ICD-10-CM

## 2024-04-22 MED ORDER — IPRATROPIUM BROMIDE 0.02 % IN SOLN
0.5000 mg | Freq: Once | RESPIRATORY_TRACT | Status: AC
  Administered 2024-04-22: 0.5 mg via RESPIRATORY_TRACT
  Filled 2024-04-22: qty 2.5

## 2024-04-22 MED ORDER — ALBUTEROL SULFATE (2.5 MG/3ML) 0.083% IN NEBU
5.0000 mg | INHALATION_SOLUTION | Freq: Once | RESPIRATORY_TRACT | Status: AC
  Administered 2024-04-22: 5 mg via RESPIRATORY_TRACT
  Filled 2024-04-22: qty 6
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
## 2024-04-23 ENCOUNTER — Emergency Department (HOSPITAL_COMMUNITY)

## 2024-04-23 LAB — CBC
HCT: 34.9 % — ABNORMAL LOW (ref 39.0–52.0)
Hemoglobin: 12.8 g/dL — ABNORMAL LOW (ref 13.0–17.0)
MCH: 33.3 pg (ref 26.0–34.0)
MCHC: 36.7 g/dL — ABNORMAL HIGH (ref 30.0–36.0)
MCV: 90.9 fL (ref 80.0–100.0)
Platelets: 186 10*3/uL (ref 150–400)
RBC: 3.84 MIL/uL — ABNORMAL LOW (ref 4.22–5.81)
RDW: 22.2 % — ABNORMAL HIGH (ref 11.5–15.5)
WBC: 6.8 10*3/uL (ref 4.0–10.5)
nRBC: 0 % (ref 0.0–0.2)

## 2024-04-23 LAB — BASIC METABOLIC PANEL WITH GFR
Anion gap: 10 (ref 5–15)
BUN: 14 mg/dL (ref 8–23)
CO2: 31 mmol/L (ref 22–32)
Calcium: 9.3 mg/dL (ref 8.9–10.3)
Chloride: 93 mmol/L — ABNORMAL LOW (ref 98–111)
Creatinine, Ser: 0.85 mg/dL (ref 0.61–1.24)
GFR, Estimated: >60 mL/min
Glucose, Bld: 108 mg/dL — ABNORMAL HIGH (ref 70–99)
Potassium: 3.6 mmol/L (ref 3.5–5.1)
Sodium: 134 mmol/L — ABNORMAL LOW (ref 135–145)

## 2024-04-23 LAB — PRO BRAIN NATRIURETIC PEPTIDE: Pro Brain Natriuretic Peptide: 307 pg/mL — ABNORMAL HIGH

## 2024-04-23 MED ORDER — BACITRACIN ZINC 500 UNIT/GM EX OINT: 1.0000 | TOPICAL_OINTMENT | Freq: Two times a day (BID) | CUTANEOUS | 0 refills | Status: AC
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
## 2024-05-11 ENCOUNTER — Telehealth

## 2024-06-11 ENCOUNTER — Ambulatory Visit: Admitting: Allergy
